# Patient Record
Sex: Male | Born: 1940
Health system: Southern US, Community
[De-identification: ages and names within clinical notes are randomized; demographics above are authoritative.]

## PROBLEM LIST (undated history)

## (undated) DIAGNOSIS — I714 Abdominal aortic aneurysm, without rupture, unspecified: Secondary | ICD-10-CM

## (undated) DIAGNOSIS — R609 Edema, unspecified: Secondary | ICD-10-CM

## (undated) DIAGNOSIS — Z8711 Personal history of peptic ulcer disease: Secondary | ICD-10-CM

## (undated) DIAGNOSIS — E785 Hyperlipidemia, unspecified: Secondary | ICD-10-CM

## (undated) DIAGNOSIS — Z9981 Dependence on supplemental oxygen: Secondary | ICD-10-CM

## (undated) DIAGNOSIS — I509 Heart failure, unspecified: Secondary | ICD-10-CM

## (undated) DIAGNOSIS — I729 Aneurysm of unspecified site: Secondary | ICD-10-CM

## (undated) DIAGNOSIS — F32A Depression, unspecified: Secondary | ICD-10-CM

## (undated) DIAGNOSIS — Z8719 Personal history of other diseases of the digestive system: Secondary | ICD-10-CM

## (undated) DIAGNOSIS — I10 Essential (primary) hypertension: Secondary | ICD-10-CM

## (undated) DIAGNOSIS — I2510 Atherosclerotic heart disease of native coronary artery without angina pectoris: Secondary | ICD-10-CM

## (undated) DIAGNOSIS — J449 Chronic obstructive pulmonary disease, unspecified: Secondary | ICD-10-CM

## (undated) DIAGNOSIS — K56600 Partial intestinal obstruction, unspecified as to cause: Secondary | ICD-10-CM

## (undated) DIAGNOSIS — I219 Acute myocardial infarction, unspecified: Secondary | ICD-10-CM

## (undated) DIAGNOSIS — Z8619 Personal history of other infectious and parasitic diseases: Secondary | ICD-10-CM

## (undated) DIAGNOSIS — F329 Major depressive disorder, single episode, unspecified: Secondary | ICD-10-CM

## (undated) DIAGNOSIS — R600 Localized edema: Secondary | ICD-10-CM

## (undated) DIAGNOSIS — R0602 Shortness of breath: Secondary | ICD-10-CM

## (undated) DIAGNOSIS — F419 Anxiety disorder, unspecified: Secondary | ICD-10-CM

## (undated) HISTORY — DX: Essential (primary) hypertension: I10

## (undated) HISTORY — DX: Shortness of breath: R06.02

## (undated) HISTORY — PX: CARDIAC CATHETERIZATION: SHX172

## (undated) HISTORY — DX: Hyperlipidemia, unspecified: E78.5

## (undated) HISTORY — DX: Atherosclerotic heart disease of native coronary artery without angina pectoris: I25.10

## (undated) HISTORY — PX: HERNIA REPAIR: SHX51

## (undated) HISTORY — PX: CORONARY ARTERY BYPASS GRAFT: SHX141

## (undated) HISTORY — PX: ESOPHAGOGASTRODUODENOSCOPY: SHX1529

## (undated) HISTORY — DX: Abdominal aortic aneurysm, without rupture: I71.4

## (undated) HISTORY — PX: PARTIAL GASTRECTOMY: SHX2172

## (undated) HISTORY — DX: Abdominal aortic aneurysm, without rupture, unspecified: I71.40

## (undated) HISTORY — DX: Aneurysm of unspecified site: I72.9

## (undated) HISTORY — PX: TONSILLECTOMY: SUR1361

## (undated) HISTORY — DX: Chronic obstructive pulmonary disease, unspecified: J44.9

## (undated) HISTORY — DX: Heart failure, unspecified: I50.9

---

## 2003-08-03 ENCOUNTER — Observation Stay (HOSPITAL_COMMUNITY): Admission: RE | Admit: 2003-08-03 | Discharge: 2003-08-04 | Payer: Self-pay | Admitting: General Surgery

## 2009-07-28 ENCOUNTER — Encounter: Payer: Self-pay | Admitting: Family Medicine

## 2009-07-28 ENCOUNTER — Ambulatory Visit: Payer: Self-pay | Admitting: Cardiology

## 2009-07-28 ENCOUNTER — Ambulatory Visit (HOSPITAL_COMMUNITY): Admission: RE | Admit: 2009-07-28 | Discharge: 2009-07-28 | Payer: Self-pay | Admitting: Family Medicine

## 2009-08-06 ENCOUNTER — Encounter (INDEPENDENT_AMBULATORY_CARE_PROVIDER_SITE_OTHER): Payer: Self-pay | Admitting: *Deleted

## 2009-08-06 LAB — CONVERTED CEMR LAB
ALT: 13 units/L
ALT: 13 units/L
Alkaline Phosphatase: 76 units/L
Bilirubin, Direct: 0.1 mg/dL
Bilirubin, Direct: 0.1 mg/dL
CO2: 31 meq/L
CO2: 31 meq/L
Calcium: 9.7 mg/dL
Chloride: 101 meq/L
Cholesterol: 150 mg/dL
Cholesterol: 150 mg/dL
Creatinine, Ser: 0.7 mg/dL
Creatinine, Ser: 0.7 mg/dL
Glucose, Bld: 113 mg/dL
Glucose, Bld: 113 mg/dL
LDL Cholesterol: 96 mg/dL
LDL Cholesterol: 96 mg/dL
Sodium: 140 meq/L
Triglycerides: 67 mg/dL

## 2009-09-19 ENCOUNTER — Encounter (INDEPENDENT_AMBULATORY_CARE_PROVIDER_SITE_OTHER): Payer: Self-pay | Admitting: *Deleted

## 2009-09-19 DIAGNOSIS — I10 Essential (primary) hypertension: Secondary | ICD-10-CM

## 2009-09-19 DIAGNOSIS — J42 Unspecified chronic bronchitis: Secondary | ICD-10-CM

## 2009-09-20 ENCOUNTER — Ambulatory Visit (HOSPITAL_COMMUNITY): Admission: RE | Admit: 2009-09-20 | Discharge: 2009-09-20 | Payer: Self-pay | Admitting: Cardiology

## 2009-09-20 ENCOUNTER — Ambulatory Visit: Payer: Self-pay | Admitting: Cardiology

## 2009-09-20 DIAGNOSIS — K279 Peptic ulcer, site unspecified, unspecified as acute or chronic, without hemorrhage or perforation: Secondary | ICD-10-CM | POA: Insufficient documentation

## 2009-09-20 DIAGNOSIS — F17200 Nicotine dependence, unspecified, uncomplicated: Secondary | ICD-10-CM

## 2009-10-19 ENCOUNTER — Ambulatory Visit: Payer: Self-pay | Admitting: Cardiology

## 2009-10-20 ENCOUNTER — Encounter: Payer: Self-pay | Admitting: Cardiology

## 2010-04-14 ENCOUNTER — Encounter (INDEPENDENT_AMBULATORY_CARE_PROVIDER_SITE_OTHER): Payer: Self-pay | Admitting: *Deleted

## 2010-06-05 ENCOUNTER — Encounter (INDEPENDENT_AMBULATORY_CARE_PROVIDER_SITE_OTHER): Payer: Self-pay | Admitting: *Deleted

## 2010-06-06 ENCOUNTER — Ambulatory Visit
Admission: RE | Admit: 2010-06-06 | Discharge: 2010-06-06 | Payer: Self-pay | Source: Home / Self Care | Attending: Cardiology | Admitting: Cardiology

## 2010-06-06 ENCOUNTER — Encounter (INDEPENDENT_AMBULATORY_CARE_PROVIDER_SITE_OTHER): Payer: Self-pay | Admitting: *Deleted

## 2010-06-06 NOTE — Assessment & Plan Note (Signed)
Summary: 1 mth nurse visit per checkout on 09/20/09/tg  Nurse Visit   Vital Signs:  Patient profile:   70 year old male Height:      69 inches Weight:      181 pounds Pulse rate:   92 / minute BP sitting:   138 / 70  (right arm)  Vitals Entered By: Dreama Saa, CNA (October 19, 2009 4:00 PM)  Current Medications (verified): 1)  Benazepril Hcl 40 Mg Tabs (Benazepril Hcl) .... Take 1/4 Tab Daily 2)  Toprol Xl 50 Mg Xr24h-Tab (Metoprolol Succinate) .... Take 1 Tab Daily 3)  Pravastatin Sodium 20 Mg Tabs (Pravastatin Sodium) .... Take One Tablet By Mouth Daily At Bedtime 4)  Furosemide 20 Mg Tabs (Furosemide) .... Take 2 Tablets By Mouth Daily 5)  Potassium Chloride 20 Meq Pack (Potassium Chloride) .... Take 1 Tab Daily 6)  Proair Hfa 108 (90 Base) Mcg/act Aers (Albuterol Sulfate) .... Use 1 Time Daily 7)  Aspir-Low 81 Mg Tbec (Aspirin) .... Take 1 Tab Daily 8)  Albuterol Sulfate (2.5 Mg/64ml) 0.083% Nebu (Albuterol Sulfate) .... Use As Directed 9)  Nitrolingual 0.4 Mg/spray Soln (Nitroglycerin) .... Use As Needed For Chestpain 10)  Alprazolam 0.25 Mg Tabs (Alprazolam) .... Take 1 Tablet By Mouth Two Times A Day As Needed 11)  Amlodipine Besylate 5 Mg Tabs (Amlodipine Besylate) .... Take One Tablet By Mouth Daily 12)  Nicotine 14 Mg/24hr Pt24 (Nicotine) 13)  Nicotine 21 Mg/24hr Pt24 (Nicotine)  Allergies (verified): No Known Drug Allergies  Visit Type:  1 month nurse visit Primary Provider:  Dr. Lilyan Punt   History of Present Illness: S: 1 month nurse visit B: last seen 09/20/2009, started on amlodipine 5mg  daily     was supposed to begin nicotine patches A: did not start nicotine patches, no c/o at all, returned bp diary      27 readings:      average hr 66,    average sbp  115, average dbp   64 R:  Continue current RX.  [place] Bing, M.D. [facility] with instructions  Teressa Lower RN  October 21, 2009 8:49 AM

## 2010-06-06 NOTE — Letter (Signed)
Summary: Appointment - Reminder 2  [facility]. [address]   [place], [place] [address]   Phone: [phone]  Fax: [phone]     April 14, 2010 MRN: [id number]   [facility] [address] [place], [place]  [address]   Dear Tommy Turner,  Our records indicate that it is time to schedule a follow-up appointment.  Dr. Dietrich Pates         recommended that you follow up with [place] in   11.2011 PAST DUE         . It is very important that we reach you to schedule this appointment. We look forward to participating in your health care needs. Please contact us at the number listed above at your earliest convenience to schedule your appointment.  If you are unable to make an appointment at this time, give [place] a call so we can update our records.     Sincerely,   Glass blower/designer

## 2010-06-06 NOTE — Letter (Signed)
Summary: bp readings  bp readings   Imported By: Faythe Ghee 10/20/2009 10:32:37  _____________________________________________________________________  External Attachment:    Type:   Image     Comment:   External Document

## 2010-06-06 NOTE — Assessment & Plan Note (Signed)
Summary: NP3 ABNORMAL EKG, DECREASE EJECTION FRACTION ON ECHO   Visit Type:  Initial Consult Primary Provider:  Dr. Lilyan Punt   History of Present Illness: [name] is seen in the office for an initial visit at the kind request of Dr. Lilyan Punt for evaluation and treatment of coronary artery disease.  This nice gentleman underwent CABG surgery at [facility] approximately 10 years ago in an uncomplicated procedure.  He has had no apparent manifestations of ischemia since, but has not seen a cardiologist nor undergone any significant cardiac testing until an echocardiogram performed recently showed moderate to severe left ventricular dysfunction.  His EKG shows evidence for multiple previous infarctions.  Records of treatment [facility] have been requested, but not yet obtained.  Medical records from Dr. Fletcher Anon office were reviewed.  Current Medications (verified): 1)  Benazepril Hcl 40 Mg Tabs (Benazepril Hcl) .... Take 1/4 Tab Daily 2)  Toprol Xl 50 Mg Xr24h-Tab (Metoprolol Succinate) .... Take 1 Tab Daily 3)  Pravastatin Sodium 20 Mg Tabs (Pravastatin Sodium) .... Take One Tablet By Mouth Daily At Bedtime 4)  Furosemide 20 Mg Tabs (Furosemide) .... Take 2 Tablets By Mouth Daily 5)  Potassium Chloride 20 Meq Pack (Potassium Chloride) .... Take 1 Tab Daily 6)  Proair Hfa 108 (90 Base) Mcg/act Aers (Albuterol Sulfate) .... Use 1 Time Daily 7)  Aspir-Low 81 Mg Tbec (Aspirin) .... Take 1 Tab Daily 8)  Albuterol Sulfate (2.5 Mg/32ml) 0.083% Nebu (Albuterol Sulfate) .... Use As Directed 9)  Nitrolingual 0.4 Mg/spray Soln (Nitroglycerin) .... Use As Needed For Chestpain 10)  Alprazolam 0.25 Mg Tabs (Alprazolam) .... Take 1 Tablet By Mouth Two Times A Day As Needed 11)  Amlodipine Besylate 5 Mg Tabs (Amlodipine Besylate) .... Take One Tablet By Mouth Daily 12)  Nicotine 14 Mg/24hr Pt24 (Nicotine) 13)  Nicotine 21 Mg/24hr Pt24 (Nicotine)  Allergies (verified): No Known  Drug Allergies  Past History:  Family History: Last updated: 2009-10-09 Father died in his 58s secondary to neoplastic disease Mother died in her 60s secondary to neoplastic disease 12 siblings, 2 brothers deceased due to neoplastic disease; one sister deceased due to emphysema  Social History: Last updated: October 09, 2009 Full Time Married with 3 children Tobacco Use - Yes.  Alcohol Use - no Regular Exercise - no Drug Use - no  Past Medical History: ASCVD: CABG surgery in 2000 HYPERTENSION (ICD-401.9) BRONCHITIS, CHRONIC (ICD-491.9) Tobacco abuse-50 pack years; one pack per day Peptic ulcer disease-status post surgical intervention Pedal edema  Past Surgical History: CABG surgery-2000 Partial gastrectomy for peptic ulcer disease in 1990 Left inguinal hernia repair-2005  Family History: Father died in his 78s secondary to neoplastic disease Mother died in her 84s secondary to neoplastic disease 12 siblings, 2 brothers deceased due to neoplastic disease; one sister deceased due to emphysema  Social History: Full Time Married with 3 children Tobacco Use - Yes.  Alcohol Use - no Regular Exercise - no Drug Use - no  EKG  Procedure date:  10-09-09  Findings:      Normall sinus rhythm First degree AV block Left atrial abnormality IVCD Low-voltage Prior inferior myocardial infarction Prior anterolateral myocardial infarction No previous tracing for comparison.   Review of Systems       Patient reports urinary frequency; mild edema of the feet and ankles.  All the systems reviewed and are negative.  Vital Signs:  Patient profile:   70 year old male Height:      69 inches  Weight:      184 pounds BMI:     27.27 Pulse rate:   70 / minute BP sitting:   165 / 81  (right arm)  Vitals Entered By: Dreama Saa, CNA (Sep 20, 2009 1:18 PM)  Physical Exam  General:    Proportionate weight and height; well-developed; no acute distress: HEENT-Gem/AT; PERRL; EOM  intact; conjunctiva and lids nl:  Neck-No JVD; no carotid bruits: Endocrine-No thyromegaly: Lungs-No tachypnea, few bibasilar rales, decreased breath sounds at the base; expiratory wheeze with prolonged expiratory phase CV-normal PMI; normal S1 and S2; S4 present Abdomen-BS normal; soft and non-tender without masses or organomegaly: MS-No deformities, cyanosis or clubbing: Neurologic-Nl cranial nerves; symmetric strength and tone: Skin- Warm, no sig. lesions: Extremities-Nl distal pulses; 1+ ankle edema    Impression & Recommendations:  Problem # 1:  ATHEROSCLEROTIC CARDIOVASCULAR DISEASE-CABG (ICD-429.2) No symptoms at present to indicate the presence of ischemia.  Patient has electrocardiographic and echocardiographic evidence for previous myocardial infarction.  Based upon the available records, it cannot be determined whether this occurred prior to CABG or subsequently.  We are seeking appropriate records to determine this.  In the interim, we will attempt to control risk factors optimally.  Problem # 2:  TOBACCO ABUSE (ICD-305.1) Importance of discontinuing tobacco use was discussed.  Patient did so for approximately one month after his surgery, but resumed cigarette smoking at his prior level.  He develops tremor and irritability when he doesn't smoke.  We will attempt to combat this with nicotine replacement therapy.  A chest x-ray will be obtained in view of his history and physical findings.  Problem # 3:  HYPERTENSION (ICD-401.9) Blood pressure control is suboptimal.  Amlodipine 5 mg q.d. will be added to his medical regime.  Hopefully, this will not exacerbate peripheral edema. [name] will return in one month for assessment by the cardiology nurses and in 6 months for reassessment by me.  Other Orders: T-Chest x-ray, 2 views ([address])  Patient Instructions: 1)  Your physician recommends that you schedule a follow-up appointment in: 6 months 2)  You have been referred to nurse  visit in 1 month, please brng medications and bp diary to nurse visit 3)  A chest x-ray takes a picture of the organs and structures inside the chest, including the heart, lungs, and blood vessels. This test can show several things, including, whether the heart is enlarged; whether fluid is building up in the lungs; and whether pacemaker / defibrillator leads are still in place. 4)  Your physician discussed the hazards of tobacco use.  Tobacco use cessation is recommended and techniques and options to help you quit were discussed. 5)  Your physician has requested that you regularly monitor and record your blood pressure readings at home.  Please use the same machine at the same time of day to check your readings and record them to bring to your follow-up visit. Prescriptions: NICOTINE 21 MG/24HR PT24 (NICOTINE)   #30 x 1   Entered by:   Teressa Lower RN   Authorized by:   Kathlen Brunswick, MD, [facility]   Signed by:   Teressa Lower RN on 09/20/2009   Method used:   Print then Give to Patient   RxID:   [id number] NICOTINE 14 MG/24HR PT24 (NICOTINE)   #30 x 0   Entered by:   Teressa Lower RN   Authorized by:   Kathlen Brunswick, MD, [facility]   Signed by:   Teressa Lower RN on 09/20/2009  Method used:   Print then Give to Patient   RxID:   [id number] AMLODIPINE BESYLATE 5 MG TABS (AMLODIPINE BESYLATE) Take one tablet by mouth daily  #30 x 6   Entered by:   Teressa Lower RN   Authorized by:   Kathlen Brunswick, MD, [facility]   Signed by:   Teressa Lower RN on 09/20/2009   Method used:   Print then Give to Patient   RxID:   [id number]

## 2010-06-14 NOTE — Miscellaneous (Signed)
Summary: LABS BMP,LIPIDS,LIVER,08/06/2009  Clinical Lists Changes  Observations: Added new observation of CALCIUM: 9.7 mg/dL (16/02/9603 54:09) Added new observation of ALBUMIN: 4.6 g/dL (81/19/1478 29:56) Added new observation of PROTEIN, TOT: 7.0 g/dL (21/30/8657 84:69) Added new observation of SGPT (ALT): 13 units/L (08/06/2009 16:24) Added new observation of SGOT (AST): 15 units/L (08/06/2009 16:24) Added new observation of ALK PHOS: 76 units/L (08/06/2009 16:24) Added new observation of BILI DIRECT: 0.1 mg/dL (62/95/2841 32:44) Added new observation of CREATININE: 0.70 mg/dL (05/09/7251 66:44) Added new observation of BUN: 14 mg/dL (03/47/4259 56:38) Added new observation of BG RANDOM: 113 mg/dL (75/64/3329 51:88) Added new observation of CO2 PLSM/SER: 31 meq/L (08/06/2009 16:24) Added new observation of CL SERUM: 101 meq/L (08/06/2009 16:24) Added new observation of K SERUM: 5.0 meq/L (08/06/2009 16:24) Added new observation of NA: 140 meq/L (08/06/2009 16:24) Added new observation of LDL: 96 mg/dL (41/66/0630 16:01) Added new observation of HDL: 41 mg/dL (09/32/3557 32:20) Added new observation of TRIGLYC TOT: 67 mg/dL (25/42/7062 37:62) Added new observation of CHOLESTEROL: 150 mg/dL (83/15/1761 60:73)

## 2010-06-14 NOTE — Assessment & Plan Note (Signed)
Summary: Z6X   Visit Type:  Follow-up Primary Provider:  Dr. Lilyan Punt   History of Present Illness: [name] returns to the office for continued assessment and treatment of coronary disease.  Since CABG surgery more than a decade ago, he has done quite well.  He maintains a good level of activity without any chest discomfort, but does intermittently experience dyspnea, likely related to chronic obstructive pulmonary disease.  He uses albuterol once or twice a day with benefit in addition to Pro-air daily.  He has had no orthopnea, PND or pedal edema.   Current Medications (verified): 1)  Benazepril Hcl 40 Mg Tabs (Benazepril Hcl) .... Take 1/2 Tablet By Mouth Daily 2)  Lopressor 50 Mg Tabs (Metoprolol Tartrate) .... Take 1 Tab Two Times A Day 3)  Pravastatin Sodium 40 Mg Tabs (Pravastatin Sodium) .... Take One Tablet By Mouth Daily At Bedtime 4)  Furosemide 20 Mg Tabs (Furosemide) .... Take 2 Tablets By Mouth Daily 5)  Potassium Chloride 20 Meq Pack (Potassium Chloride) .... Take 1 Tab Daily 6)  Proair Hfa 108 (90 Base) Mcg/act Aers (Albuterol Sulfate) .... Use 1 Time Daily 7)  Aspir-Low 81 Mg Tbec (Aspirin) .... Take 1 Tab Daily 8)  Albuterol Sulfate (2.5 Mg/25ml) 0.083% Nebu (Albuterol Sulfate) .... Use As Directed 9)  Nitrolingual 0.4 Mg/spray Soln (Nitroglycerin) .... Use As Needed For Chestpain 10)  Alprazolam 0.25 Mg Tabs (Alprazolam) .... Take 1 Tablet By Mouth Two Times A Day As Needed 11)  Amlodipine Besylate 5 Mg Tabs (Amlodipine Besylate) .... Take One Tablet By Mouth Daily 12)  Wellbutrin Sr 100 Mg Xr12h-Tab (Bupropion Hcl) .... Take 1 Tablet By Mouth Once A Day Then Increase To 150mg  Bid 13)  Wellbutrin Sr 150 Mg Xr12h-Tab (Bupropion Hcl) .... Take 1 Tablet By Mouth Two Times A Day  Allergies (verified): No Known Drug Allergies  Comments:  Nurse/Medical Assistant: patient brought med list reviewed with patient patient uses [facility] [place]  Past  History:  Past Medical History: Last updated: 21-Sep-2009 ASCVD: CABG surgery in 2000 HYPERTENSION (ICD-401.9) BRONCHITIS, CHRONIC (ICD-491.9) Tobacco abuse-50 pack years; one pack per day Peptic ulcer disease-status post surgical intervention Pedal edema  Past Surgical History: Last updated: 21-Sep-2009 CABG surgery-2000 Partial gastrectomy for peptic ulcer disease in 1990 Left inguinal hernia repair-2005  Family History: Last updated: 09/21/09 Father died in his 74s secondary to neoplastic disease Mother died in her 39s secondary to neoplastic disease 12 siblings, 2 brothers deceased due to neoplastic disease; one sister deceased due to emphysema  Social History: Last updated: 09-21-2009 Full Time Married with 3 children Tobacco Use - Yes.  Alcohol Use - no Regular Exercise - no Drug Use - no  PMH, FH, and Social History reviewed and updated.  Review of Systems       See history of present illness.  Vital Signs:  Patient profile:   70 year old male Weight:      175 pounds BMI:     25.94 O2 Sat:      92 % on Room air Pulse rate:   85 / minute BP sitting:   146 / 77  (left arm)  Vitals Entered By: Dreama Saa, CNA (June 06, 2010 1:19 PM)  O2 Flow:  Room air  Physical Exam  General:  Proportionate weight and height; well developed; no acute distress:   Weight-175, 6 pounds less then in 10/2009 Neck-No JVD; no carotid bruits: Lungs-expiratory wheezes and rhonchi; prolonged expiratory phase; no  rales. Cardiovascular-normal PMI; normal S1 and S2: Abdomen-BS normal; soft and non-tender without masses or organomegaly:  Musculoskeletal-No deformities, cyanosis or clubbing: Neurologic-Normal cranial nerves; symmetric strength and tone:  Skin-Warm, no significant lesions: Extremities-Nl distal pulses;  trace edema:     Impression & Recommendations:  Problem # 1:  ATHEROSCLEROTIC CARDIOVASCULAR DISEASE-CABG (ICD-429.2) Patient has no symptoms to suggest  recurrent myocardial ischemia.  Our efforts will focus on optimal control of cardiovascular risk factor.  Results of the lipid profile obtained 8 months ago were superb.  Current therapy will be continued.     Problem # 2:  TOBACCO ABUSE (ICD-305.1) Mr. Stailey has attempted to discontinue cigarette smoking without success.  When he utilized nicotine patches, he experienced tremor within a few hours of his last cigarette.  He is reluctant to try Chantix due to the adverse effects that have been reported.  I've given him Wellbutrin with appropriate instructions for use and asked him to think about joining a group or seeking assistance from a therapist.  Problem # 3:  HYPERTENSION (ICD-401.9) Blood pressure control has generally been good, but systolics are mildly elevated.  He will increase benazepril to 20 mg q.d. and continue to monitor blood pressure.  Other Orders: Future Orders: T-Lipid Profile ([id number]) ... 05/07/2011 T-Comprehensive Metabolic Panel [id number]) ... 05/07/2011  Patient Instructions: 1)  Your physician recommends that you schedule a follow-up appointment in: 1 year 2)  Your physician recommends that you return for lab work in:11 months 3)  Your physician has recommended you make the following change in your medication: increase benazepril to 1/2 tablet daily, increase pravastatin to 40mg  dialy, wellbutrin 100mg  daily x 7 days then increase to wellbutrin 150mg  two times a day 4)   for 2 months   STOP SMOKING ON DAY 10 Prescriptions: WELLBUTRIN SR 150 MG XR12H-TAB (BUPROPION HCL) Take 1 tablet by mouth two times a day  #60 x 2   Entered by:   Teressa Lower RN   Authorized by:   Kathlen Brunswick, MD, [facility]   Signed by:   Teressa Lower RN on 06/06/2010   Method used:   Electronically to        [facility]  [address] (retail)       [address]       [place], [place]  [address]       Ph: [phone]       Fax: [phone]   RxID:    [id number] WELLBUTRIN SR 100 MG XR12H-TAB (BUPROPION HCL) Take 1 tablet by mouth once a day then increase to 150mg  bid  #7 x 0   Entered by:   Teressa Lower RN   Authorized by:   Kathlen Brunswick, MD, [facility]   Signed by:   Teressa Lower RN on 06/06/2010   Method used:   Electronically to        [facility]  [address] (retail)       [address]       [place], [place]  [address]       Ph: [phone]       Fax: [phone]   RxID:   [id number] PRAVASTATIN SODIUM 40 MG TABS (PRAVASTATIN SODIUM) Take one tablet by mouth daily at bedtime  #30 x 3   Entered by:   Teressa Lower RN   Authorized by:   Kathlen Brunswick, MD, [facility]   Signed  by:   Teressa Lower RN on 06/06/2010   Method used:   Electronically to        [facility]  [address] (retail)       [address]       [place], [place]  [address]       Ph: [phone]       Fax: [phone]   RxID:   [id number] BENAZEPRIL HCL 40 MG TABS (BENAZEPRIL HCL) take 1/2 tablet by mouth daily  #15 x 30   Entered by:   Teressa Lower RN   Authorized by:   Kathlen Brunswick, MD, [facility]   Signed by:   Teressa Lower RN on 06/06/2010   Method used:   Electronically to        [facility]  [address] (retail)       [address]       [place], [place]  [address]       Ph: [phone]       Fax: [phone]   RxID:   [id number]

## 2010-06-14 NOTE — Letter (Signed)
Summary: [facility] at [employer]  [address], [place] [address]   Phone: [phone]  Fax: [phone]     June 06, 2010 MRN: [id number]   [facility] [address] [place], [place]  [address]      YOUR LAB WORK IS DUE   May 07, 2011  Please go to [facility], located across the street from [facility] on the second floor.  Hours are Monday - Friday 7am until 7:30pm         Saturday 8am until 12noon    _X_  DO NOT EAT OR DRINK AFTER MIDNIGHT EVENING PRIOR TO LABWORK

## 2010-08-23 ENCOUNTER — Telehealth: Payer: Self-pay | Admitting: Cardiology

## 2010-08-23 NOTE — Telephone Encounter (Signed)
Tommy Turner took call while we were in meeting / only information was that pt's wife would like to speak with nurse regarding prescriptions / tg

## 2010-08-24 NOTE — Telephone Encounter (Signed)
Pt is to take 1/2 tablet of benazepril daily

## 2010-09-22 NOTE — Op Note (Signed)
Tommy Turner, Tommy Turner                         ACCOUNT NO.:  [id number]   MEDICAL RECORD NO.:  [id number]                   PATIENT TYPE:  AMB   LOCATION:  DAY                                  FACILITY:  [facility]   PHYSICIAN:  Jerolyn Shin C. Katrinka Blazing, M.D.                DATE OF BIRTH:  1940/06/30   DATE OF PROCEDURE:  DATE OF DISCHARGE:                                 OPERATIVE REPORT   PREOPERATIVE DIAGNOSES:  Left inguinal hernia.   POSTOPERATIVE DIAGNOSES:  Left indirect inguinal hernia.   PROCEDURE:  Left inguinal hernia repair.   SURGEON:  Dirk Dress. Katrinka Blazing, M.D.   DESCRIPTION OF PROCEDURE:  Under spinal anesthesia, the left inguinal area  was prepped and draped in a sterile field.  Upon starting the procedure, the  patient felt significant pain so he was switched to a general LMA.  A  curvilinear left lower quadrant incision was made. It was extended down to  the aponeurosis.  The superficial vessels were doubly clamped, divided and  tied with 3-0 Vicryl.  The aponeurosis was opened. There was a very hard  indurated mass of the cord.  This mass was dissected back to the internal  ring.  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 The appendiceal epiploica was clamped at the base,  divided and controlled with ligature of 3-0 Vicryl.  The sac was then  clamped and suture ligated with 3-0 Monocryl.  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  The lipoma of the cord was  dissected back to the internal ring, clamped, excised and controlled with  ligatures of 3-0 Vicryl.  A medium plug was placed and was sutured in place  with interrupted #0 Prolene. Only a patch was placed and was sutured in  place with #0 Prolene.  The cord was placed in anatomic position. The  aponeurosis was closed with 3-0 Vicryl.  The subcutaneous tissue was closed  with 3-0 Biosyn.  The skin was closed with staples. Local infiltrating with  20 mL of  0.5% Marcaine with epinephrine was carried out.  The incision was  closed with staples.  A dressing was placed. The patient was awakened from  anesthesia, transferred to a bed and taken to the post anesthesia care unit  for monitoring.      ___________________________________________                                            Dirk Dress. Katrinka Blazing, M.D.   LCS/MEDQ  D:  08/03/2003  T:  08/03/2003  Job:  [id number]

## 2010-09-22 NOTE — H&P (Signed)
Tommy Turner, Tommy Turner                         ACCOUNT NO.:  [id number]   MEDICAL RECORD NO.:  [id number]                   PATIENT TYPE:  AMB   LOCATION:  DAY                                  FACILITY:  [facility]   PHYSICIAN:  Jerolyn Shin C. Katrinka Blazing, M.D.                DATE OF BIRTH:  June 25, 1940   DATE OF ADMISSION:  DATE OF DISCHARGE:                                HISTORY & PHYSICAL   HISTORY OF PRESENT ILLNESS:  [age]-year-old male with a history of left  groin hernia.  Hernia started at work on 16 July 2003.  He was diagnosed on  19 July 2003.  He was placed on bedrest and his pain has improved.  He is  now scheduled for a left inguinal hernia repair.   PAST HISTORY:  1. He has atherosclerotic heart disease, status post multivessel coronary     artery bypass graft.  2. He has chronic bronchitis.  3. Hypertension.  4. Chronic peripheral edema.   SURGERY:  1. Gastric surgery.  2. Coronary artery bypass graft.   MEDICATIONS:  1. Benazepril 40 mg daily.  2. Metoprolol 50 mg daily.  3. Lipitor 10 mg daily.  4. Furosemide 20 mg b.i.d.  5. Potassium 20 mEq daily.  6. Albuterol inhaler 4 times daily.  7. Advair inhaler 1 inhalation daily.  8. Aspirin 81 mg daily.   SOCIAL HISTORY:  He is married, employed, smokes 1 pack of cigarettes a day,  does not drink or use drugs.   FAMILY HISTORY:  Family history is positive for diabetes mellitus and GI  cancer.   PHYSICAL EXAMINATION:  VITAL SIGNS:  On exam, blood pressure 130/78, pulse  80, respirations 20, weight 190 pounds.  HEENT:  Unremarkable.  NECK:  Neck is supple.  CHEST:  Chest is clear.  He has a healed median sternotomy scar.  HEART:  Heart is regular rate and rhythm without murmur, gallop or rub.  ABDOMEN:  Abdomen is soft, nontender and no masses.  Large symptomatic left  inguinal hernia.  SKIN:  Skin reveals diffuse cutaneous lipomatosis of entire body, upper  extremities, lower extremities and trunk.  EXTREMITIES:   No cyanosis, clubbing or edema.  NEUROLOGIC:  No focal motor, sensory or cerebellar deficit.   IMPRESSION:  1. Left inguinal hernia.  2. Chronic bronchitis.  3. Atherosclerotic heart disease.  4. Hypertension.  5. Chronic peripheral edema.   PLAN:  Scheduled for left inguinal hernia repair.     ___________________________________________                                         Tommy Turner. Katrinka Blazing, M.D.   LCS/MEDQ  D:  08/03/2003  T:  08/03/2003  Job:  [id number]

## 2010-10-12 ENCOUNTER — Other Ambulatory Visit: Payer: Self-pay | Admitting: Cardiology

## 2010-10-16 NOTE — Telephone Encounter (Signed)
[place] patient.   

## 2010-10-16 NOTE — Telephone Encounter (Signed)
**Note De-identified Dago Jungwirth Obfuscation** [place] pt. 

## 2010-10-17 ENCOUNTER — Other Ambulatory Visit: Payer: Self-pay | Admitting: Family Medicine

## 2010-10-17 DIAGNOSIS — Z139 Encounter for screening, unspecified: Secondary | ICD-10-CM

## 2010-10-26 ENCOUNTER — Other Ambulatory Visit (HOSPITAL_COMMUNITY): Payer: Self-pay

## 2010-11-23 ENCOUNTER — Ambulatory Visit (HOSPITAL_COMMUNITY)
Admission: RE | Admit: 2010-11-23 | Discharge: 2010-11-23 | Disposition: A | Discharge status: Home / Self Care | Payer: 59 | Source: Ambulatory Visit | Attending: Family Medicine | Admitting: Family Medicine

## 2010-11-23 ENCOUNTER — Other Ambulatory Visit: Payer: Self-pay | Admitting: Family Medicine

## 2010-11-23 DIAGNOSIS — I714 Abdominal aortic aneurysm, without rupture, unspecified: Secondary | ICD-10-CM | POA: Insufficient documentation

## 2010-11-23 DIAGNOSIS — Z139 Encounter for screening, unspecified: Secondary | ICD-10-CM

## 2010-12-27 ENCOUNTER — Encounter: Payer: Self-pay | Admitting: Vascular Surgery

## 2010-12-27 NOTE — Progress Notes (Signed)
[facility] [place]  Referred by: Dr. Georgie Chard  Reason for referral: AAA  History of Present Illness  The patient is a 70 y.o. male who presents with chief complaint: AAA.  Previous studies demonstrate an AAA, measuring 4.5 cm.  The patient does not have back or abdominal pain.  The patient does not have a history of embolic episodes from the AAA.  The patient's risk factors for AAA included: male sex, age, and smoking history.  The patient continues to smoke cigarettes.  Past Medical History  Diagnosis Date  . AAA (abdominal aortic aneurysm)   . Cough   . Shortness of breath   . COPD (chronic obstructive pulmonary disease)   . Hyperlipidemia   . CAD (coronary artery disease)   . Hypertension   . Ulcer     gastric    Past Surgical History  Procedure Date  . Lipoma excision     right lower back  . Partial gastrectomy     approx 1990    History   Social History  . Marital Status: Married    Spouse Name: N/A    Number of Children: N/A  . Years of Education: N/A   Occupational History  . Not on file.   Social History Main Topics  . Smoking status: Current Everyday Smoker -- 0.5 packs/day for 55 years    Types: Cigarettes  . Smokeless tobacco: Not on file  . Alcohol Use: No  . Drug Use: No  . Sexually Active: Not on file   Other Topics Concern  . Not on file   Social History Narrative  . No narrative on file    Family History  Problem Relation Age of Onset  . Hypertension Brother   . Heart disease Brother   . Cancer Mother   . Cancer Father     prostate  . COPD Sister     Current outpatient prescriptions:albuterol (PROVENTIL HFA;VENTOLIN HFA) 108 (90 BASE) MCG/ACT inhaler, Inhale 2 puffs into the lungs as needed.  , Disp: , Rfl: ;  albuterol (PROVENTIL) (2.5 MG/3ML) 0.083% nebulizer solution, Take 2.5 mg by nebulization every 4 (four) hours as needed.  , Disp: , Rfl: ;  aspirin 81 MG tablet, Take 81 mg by mouth daily.  , Disp:  , Rfl:  benazepril (LOTENSIN) 40 MG tablet, Take 40 mg by mouth. Take 1/2 tab daily, Disp: , Rfl: ;  furosemide (LASIX) 20 MG tablet, Take 20 mg by mouth. Take 2 tabs in am daily, Disp: , Rfl: ;  metoprolol (TOPROL-XL) 50 MG 24 hr tablet, Take 50 mg by mouth daily.  , Disp: , Rfl: ;  nitroGLYCERIN (NITROLINGUAL) 0.4 MG/SPRAY spray, Place 1 spray under the tongue every 5 (five) minutes as needed.  , Disp: , Rfl:  potassium chloride SA (K-DUR,KLOR-CON) 20 MEQ tablet, Take 20 mEq by mouth daily.  , Disp: , Rfl: ;  pravastatin (PRAVACHOL) 40 MG tablet, TAKE ONE TABLET BY MOUTH EVERY DAY AT BEDTIME **STOP  TAKING  PRAVASTATIN  20MG **, Disp: 30 tablet, Rfl: 6;  tiotropium (SPIRIVA) 18 MCG inhalation capsule, Place 18 mcg into inhaler and inhale daily.  , Disp: , Rfl:   Allergies as of 12/29/2010  . (No Known Allergies)    Review of Systems (Positive items in bold and italic, otherwise negative)  General: Weight loss, Weight gain, Loss of appetite, Fever  Neurologic: Dizziness, Blackouts, Headaches, Seizure  Ear/Nose/Throat: Change in eyesight, Change in hearing, Nose bleeds, Sore throat  Vascular: Pain in legs with walking, Pain in feet while lying flat, Non-healing ulcer, Stroke, "Mini stroke", Slurred speech, Temporary blindness, Blood clot in vein, Phlebitis  Pulmonary: Home oxygen, Productive cough, Bronchitis, Coughing up blood, Asthma, Wheezing  Musculoskeletal: Arthritis, Joint pain, Muscle pain  Cardiac: Chest pain, Chest tightness/pressure, Shortness of breath when lying flat, Shortness of breath with exertion, Palpitations, Heart murmur, Arrythmia, Atrial fibrillation  Hematologic: Bleeding problems, Clotting disorder, Anemia  Psychiatric:  Depression, Anxiety, Attention deficit disorder  Gastrointestinal:  Black stool, Blood in stool, Peptic ulcer disease, Reflux, Hiatal hernia, Trouble swallowing, Diarrhea, Constipation  Urinary:  Kidney disease, Burning with urination, Frequent  urination, Difficulty urinating  Skin: Ulcers, Rashes  Physical Examination  Filed Vitals:   12/29/10 0916  BP: 146/73  Pulse: 64  Resp: 20    General: A&O x 3, WDWN, appears stated age  Head: [place]/AT, mild Temporalis wasting   Ear/Nose/Throat: Hearing grossly intact, nares w/o erythema or drainage, oropharynx w/o Erythema/Exudate  Eyes: PERRLA, EOMI  Neck: Supple, no nuchal rigidity, no palpable LAD  Pulmonary: Sym exp, good air movt, CTAB, no rales, rhonchi, & wheezing  Cardiac: RRR, Nl S1, S2, no Murmurs, rubs or gallops  Vascular: Vessel Right Left  Radial Palpable Palpable  [place] Palpable Palpable  Brachial Palpable Palpable  Carotid Palpable, with bruit Palpable, without bruit  Aorta Non-palpable N/A  Femoral Palpable Palpable  Popliteal Prominently palpable Palpable  PT Palpable Palpable  DP Palpable Palpable   Gastrointestinal: soft, NTND, -G/R, - HSM, - masses, - CVAT B, no palpable pulsatile midline mass  Musculoskeletal: M/S 5/5 throughout , Extremities without ischemic changes   Neurologic: CN 2-12 intact , Pain and light touch intact in extremities , Motor exam as listed above  Psychiatric: Judgment intact, Mood & affect appropriate for pt's clinical situation  Dermatologic: See M/S exam for extremity exam, no rashes otherwise noted, extensive actinic keratoses on arms and face  Lymph : No Cervical, Axillary, or Inguinal lymphadenopathy   Non-Invasive Vascular Imaging   B Carotid duplex and B popliteal duplex ordered  Outside Studies/Documentation 11 pages of outside documents were reviewed including: history and ultrasound report.  Medical Decision Making  The patient is a 70 y.o. male who presents with: 4.5 AAA, R carotid bruit, and prominent B popliteal pulses.   Based on this patient's exam and diagnostic studies, he needs CTA Chest, Abd, pelvis to eval his anatomy and r/o TAA  He also needs a screening B carotid duplex to r/o carotid  stenosis and B popliteal duplex to r/o popliteal aneurysms.  The threshold for repair is AAA size > 5.5 cm, growth > 1 cm/yr, and symptomatic status.  The patient will follow up in 2-4 weeks with the above studies.    I emphasized the importance of maximal medical management including strict control of blood pressure, blood glucose, and lipid levels, obtaining regular exercise, and cessation of smoking.    Thank you for allowing [place] to participate in this patient's care.  Leonides Sake, MD [facility] [place] Office: [phone] Pager: [phone]

## 2010-12-28 ENCOUNTER — Encounter: Payer: Self-pay | Admitting: Vascular Surgery

## 2010-12-29 ENCOUNTER — Ambulatory Visit (INDEPENDENT_AMBULATORY_CARE_PROVIDER_SITE_OTHER): Payer: 59 | Admitting: Vascular Surgery

## 2010-12-29 ENCOUNTER — Encounter: Payer: Self-pay | Admitting: Vascular Surgery

## 2010-12-29 VITALS — BP 146/73 | HR 64 | Resp 20 | Ht 69.0 in | Wt 170.5 lb

## 2010-12-29 DIAGNOSIS — I714 Abdominal aortic aneurysm, without rupture: Secondary | ICD-10-CM

## 2011-02-01 ENCOUNTER — Encounter: Payer: Self-pay | Admitting: Vascular Surgery

## 2011-02-02 ENCOUNTER — Other Ambulatory Visit (INDEPENDENT_AMBULATORY_CARE_PROVIDER_SITE_OTHER): Payer: 59 | Admitting: *Deleted

## 2011-02-02 ENCOUNTER — Ambulatory Visit
Admission: RE | Admit: 2011-02-02 | Discharge: 2011-02-02 | Disposition: A | Discharge status: Home / Self Care | Payer: 59 | Source: Ambulatory Visit | Attending: Vascular Surgery | Admitting: Vascular Surgery

## 2011-02-02 ENCOUNTER — Ambulatory Visit (INDEPENDENT_AMBULATORY_CARE_PROVIDER_SITE_OTHER): Payer: 59 | Admitting: Vascular Surgery

## 2011-02-02 ENCOUNTER — Ambulatory Visit (INDEPENDENT_AMBULATORY_CARE_PROVIDER_SITE_OTHER): Payer: 59 | Admitting: *Deleted

## 2011-02-02 ENCOUNTER — Encounter: Payer: Self-pay | Admitting: Vascular Surgery

## 2011-02-02 VITALS — BP 102/65 | HR 80 | Resp 14 | Ht 68.0 in | Wt 186.0 lb

## 2011-02-02 DIAGNOSIS — R0989 Other specified symptoms and signs involving the circulatory and respiratory systems: Secondary | ICD-10-CM

## 2011-02-02 DIAGNOSIS — I714 Abdominal aortic aneurysm, without rupture, unspecified: Secondary | ICD-10-CM | POA: Insufficient documentation

## 2011-02-02 DIAGNOSIS — I700 Atherosclerosis of aorta: Secondary | ICD-10-CM

## 2011-02-02 MED ORDER — IOHEXOL 350 MG/ML SOLN: 100.0000 mL | Freq: Once | INTRAVENOUS | Status: AC | PRN

## 2011-02-02 NOTE — Procedures (Unsigned)
LOWER EXTREMITY ARTERIAL DUPLEX  INDICATION:  Prominent popliteal pulses.  History of AAA.  HISTORY: Diabetes:  No. Cardiac:  Yes. Hypertension:  Yes. Smoking:  Yes. Previous Surgery:  No.  SINGLE LEVEL ARTERIAL EXAM                         RIGHT                LEFT Brachial: Anterior tibial: Posterior tibial: Peroneal: Ankle/Brachial Index:  LOWER EXTREMITY ARTERIAL DUPLEX EXAM  DUPLEX: 1. Normal right popliteal artery with triphasic waveforms. 2. Slight ectasia of the left popliteal artery with triphasic     waveforms.  Mild intramural thrombus is observed. 3. See attached diagram for diameter measurements.  IMPRESSION: 1. No evidence of popliteal aneurysm, although left popliteal artery     is slightly ectatic. 2. Normal arterial waveforms in the bilateral popliteal arteries.   ___________________________________________ Fransisco Hertz, MD  LT/MEDQ  D:  02/02/2011  T:  02/02/2011  Job:  [id number]

## 2011-02-02 NOTE — Progress Notes (Signed)
[facility] [place]  Established Abdominal Aortic Aneurysm  History of Present Illness  The patient is a 70 y.o. male who presents with chief complaint: follow up for CTA chest/abd and vascular lab studies.  The patient does not have back or abdominal pain.  He does not have intermittent claudication or rest pain.  The patient's PMH, PSH, SH, FamHx, Med, Allergies and ROS are unchanged from 12/29/10.  Physical Examination  Filed Vitals:   02/02/11 1530  BP: 102/65  Pulse: 80  Resp: 14  Height: 5\' 8"  (1.727 m)  Weight: 186 lb (84.369 kg)    General: A&O x 3, WDWN  Gastrointestinal: soft, NTND, -G/R, - HSM, - masses, - CVAT B, no midline aortic mass  Musculoskeletal: Gait without ataxia, no obvious ischemia in any extremities  Neurologic: Pain and light touch intact in extremities , Motor exam as listed above  Radiology  CTA Chest/Abd/Pelvis (Date: 02/02/11) 1. Infrarenal abdominal aortic aneurysm with pre stent  measurements as above. Approximately 50% narrowing of the origin  of the right common iliac artery.  2. No thoracic aortic aneurysm.  3. Postsurgical change of the intestines with perianastomotic bowel dilatation but no evidence of enteric obstruction.  4. Moderate to severe emphysematous change of the lungs.  5. Cardiomegaly with mild dilatation of the left ventricular cavity. Further evaluation may be obtained with a cardiac echo as clinically indicated.  Based on my interpretation of the CT, patient remains an EVAR candidate though I suspect PTA of R CIA would be needed.  AAA is small and does not Meet repair criteria.  Non-Invasive Vascular Imaging  CAROTID DUPLEX (Date: 02/02/11):   R ICA stenosis: 1-39%  R VA: patent and antegrade  L ICA stenosis: 1-39%  L VA: patent and antegrade  BLE Arterial Duplex (Date: 02/02/11)  RLE: No pop aneurysm, triphasic waveforms  LLE: Pop ectasia (8.3 mm x 7.8 mm) with mild intramural thrombus,  triphasic waveforms  Medical Decision Making  The patient is a 70 y.o. male who presents with: small asx AAA and R popliteal ectasia   Based on this patient's exam and diagnostic studies, he needs annual AAA duplex an LLE arterial duplex.. The patient will follow up in 1 year with these studies.  The threshold for repair is AAA size > 5.5 cm, growth > 1 cm/yr, and symptomatic status.  The threshold for popliteal aneurysm repair is: 2 cm, extensive thrombus, or sx.   I emphasized the importance of maximal medical management including strict control of blood pressure, blood glucose, and lipid levels, obtaining regular exercise, and cessation of smoking.    Thank you for allowing [place] to participate in this patient's care.  Leonides Sake, MD [facility] [place] Office: [phone] Pager: [phone]

## 2011-02-12 NOTE — Procedures (Unsigned)
CAROTID DUPLEX EXAM  INDICATION:  Right carotid bruit.  HISTORY: Diabetes:  No. Cardiac:  Yes. Hypertension:  Yes. Smoking:  Yes. Previous Surgery:  No. CV History: Amaurosis Fugax No, Paresthesias No, Hemiparesis No.                                      RIGHT               LEFT Brachial systolic pressure: Brachial Doppler waveforms: Vertebral direction of flow:        Antegrade           Antegrade DUPLEX VELOCITIES (cm/sec) CCA peak systolic                   83                  42 ECA peak systolic                   96                  67 ICA peak systolic                   59                  42 ICA end diastolic                   20                  16 PLAQUE MORPHOLOGY:                  Heterogenous/calcific                 Heterogenous PLAQUE AMOUNT:                      Mild                Mild PLAQUE LOCATION:                    ICA                 ICA  IMPRESSION: 1. 1% to 39% bilateral internal carotid artery plaquing. 2. Antegrade vertebral arteries bilaterally.   ___________________________________________ Fransisco Hertz, MD  LT/MEDQ  D:  02/02/2011  T:  02/02/2011  Job:  [id number]

## 2011-05-11 ENCOUNTER — Other Ambulatory Visit: Payer: Self-pay | Admitting: Cardiology

## 2011-11-02 ENCOUNTER — Other Ambulatory Visit: Payer: Self-pay | Admitting: Family Medicine

## 2011-11-02 DIAGNOSIS — I729 Aneurysm of unspecified site: Secondary | ICD-10-CM

## 2011-11-12 ENCOUNTER — Ambulatory Visit (HOSPITAL_COMMUNITY)
Admission: RE | Admit: 2011-11-12 | Discharge: 2011-11-12 | Disposition: A | Discharge status: Home / Self Care | Payer: 59 | Source: Ambulatory Visit | Attending: Family Medicine | Admitting: Family Medicine

## 2011-11-12 DIAGNOSIS — I729 Aneurysm of unspecified site: Secondary | ICD-10-CM

## 2011-11-12 DIAGNOSIS — Z125 Encounter for screening for malignant neoplasm of prostate: Secondary | ICD-10-CM | POA: Diagnosis not present

## 2011-11-12 DIAGNOSIS — R5383 Other fatigue: Secondary | ICD-10-CM | POA: Diagnosis not present

## 2011-11-12 DIAGNOSIS — I714 Abdominal aortic aneurysm, without rupture, unspecified: Secondary | ICD-10-CM | POA: Insufficient documentation

## 2011-11-12 DIAGNOSIS — E782 Mixed hyperlipidemia: Secondary | ICD-10-CM | POA: Diagnosis not present

## 2011-11-12 DIAGNOSIS — E785 Hyperlipidemia, unspecified: Secondary | ICD-10-CM | POA: Diagnosis not present

## 2011-11-12 DIAGNOSIS — R7301 Impaired fasting glucose: Secondary | ICD-10-CM | POA: Diagnosis not present

## 2011-11-12 DIAGNOSIS — Z09 Encounter for follow-up examination after completed treatment for conditions other than malignant neoplasm: Secondary | ICD-10-CM | POA: Insufficient documentation

## 2011-11-12 DIAGNOSIS — R5381 Other malaise: Secondary | ICD-10-CM | POA: Diagnosis not present

## 2011-11-12 DIAGNOSIS — Z79899 Other long term (current) drug therapy: Secondary | ICD-10-CM | POA: Diagnosis not present

## 2011-12-07 ENCOUNTER — Other Ambulatory Visit: Payer: Self-pay | Admitting: Cardiology

## 2012-02-05 ENCOUNTER — Other Ambulatory Visit: Payer: Self-pay | Admitting: *Deleted

## 2012-02-05 DIAGNOSIS — I714 Abdominal aortic aneurysm, without rupture: Secondary | ICD-10-CM

## 2012-02-05 DIAGNOSIS — I70219 Atherosclerosis of native arteries of extremities with intermittent claudication, unspecified extremity: Secondary | ICD-10-CM

## 2012-02-07 ENCOUNTER — Encounter: Payer: Self-pay | Admitting: Vascular Surgery

## 2012-02-08 ENCOUNTER — Ambulatory Visit: Payer: 59 | Admitting: Vascular Surgery

## 2012-05-08 ENCOUNTER — Other Ambulatory Visit: Payer: Self-pay | Admitting: Family Medicine

## 2012-05-08 DIAGNOSIS — I714 Abdominal aortic aneurysm, without rupture: Secondary | ICD-10-CM

## 2012-05-12 ENCOUNTER — Ambulatory Visit (HOSPITAL_COMMUNITY)
Admission: RE | Admit: 2012-05-12 | Discharge: 2012-05-12 | Disposition: A | Discharge status: Home / Self Care | Payer: 59 | Source: Ambulatory Visit | Attending: Family Medicine | Admitting: Family Medicine

## 2012-05-12 DIAGNOSIS — I714 Abdominal aortic aneurysm, without rupture, unspecified: Secondary | ICD-10-CM | POA: Insufficient documentation

## 2012-05-12 DIAGNOSIS — Z09 Encounter for follow-up examination after completed treatment for conditions other than malignant neoplasm: Secondary | ICD-10-CM | POA: Insufficient documentation

## 2012-07-31 ENCOUNTER — Telehealth: Payer: Self-pay | Admitting: Family Medicine

## 2012-07-31 NOTE — Telephone Encounter (Signed)
Nitroglycerin tablets refill.  Advised patient to call pharmacy, stated they are now using [facility].

## 2012-08-01 ENCOUNTER — Other Ambulatory Visit: Payer: Self-pay

## 2012-08-01 DIAGNOSIS — R079 Chest pain, unspecified: Secondary | ICD-10-CM

## 2012-08-01 MED ORDER — NITROGLYCERIN 0.4 MG SL SUBL: 0.4000 mg | SUBLINGUAL_TABLET | SUBLINGUAL | Status: DC | PRN

## 2012-08-01 NOTE — Telephone Encounter (Signed)
NTG 0.4mg     May dispense 2  bottle   Use as diredted 6 refills

## 2012-08-01 NOTE — Telephone Encounter (Signed)
Sent in refill on NTG 0.4mg   Use as directed 6 RFS to [facility]. Pharmacy. Wife Emitt Maglione) was notified.

## 2012-08-04 ENCOUNTER — Telehealth: Payer: Self-pay | Admitting: *Deleted

## 2012-08-04 NOTE — Telephone Encounter (Signed)
Pt wife app today, will bring info on husbands metoprolol dose

## 2012-08-12 ENCOUNTER — Encounter: Payer: Self-pay | Admitting: *Deleted

## 2012-08-13 ENCOUNTER — Ambulatory Visit (INDEPENDENT_AMBULATORY_CARE_PROVIDER_SITE_OTHER): Payer: 59 | Admitting: Family Medicine

## 2012-08-13 ENCOUNTER — Encounter: Payer: Self-pay | Admitting: Family Medicine

## 2012-08-13 VITALS — Temp 98.5°F | Ht 69.0 in | Wt 166.0 lb

## 2012-08-13 DIAGNOSIS — J209 Acute bronchitis, unspecified: Secondary | ICD-10-CM

## 2012-08-13 DIAGNOSIS — J441 Chronic obstructive pulmonary disease with (acute) exacerbation: Secondary | ICD-10-CM

## 2012-08-13 MED ORDER — LEVOFLOXACIN 500 MG PO TABS: 500.0000 mg | ORAL_TABLET | Freq: Every day | ORAL | Status: AC

## 2012-08-13 NOTE — Progress Notes (Signed)
  Subjective:    Patient ID: Tommy Turner, male    DOB: [birth year], 73 y.o.   MRN: [id number]  Cough This is a new problem. The current episode started 1 to 4 weeks ago. The problem has been gradually improving. The problem occurs every few minutes. The cough is productive of sputum. The symptoms are aggravated by dust and lying down. He has tried OTC cough suppressant for the symptoms.   Patient does have a history of CBD he denies hemoptysis he denies wheezing denies high fever chills states he is bringing up some mucoid drainage. His breathing is no worse than previous.   Review of Systems  Respiratory: Positive for cough.   no chest pressure tightness pain or swelling in the legs     Objective:   Physical Exam Eardrums normal throat normal neck no masses lungs are clear coarse cough noted heart regular not rest for distress       Assessment & Plan:  Bronchitis with possible sinusitis Levaquin 10 days as directed he should followup within the next 3 months for comprehensive checkup will

## 2012-08-13 NOTE — Patient Instructions (Signed)
If cough doesn't go away over the next three weeks follow up  Call sooner if worse

## 2012-09-12 ENCOUNTER — Telehealth: Payer: Self-pay | Admitting: Family Medicine

## 2012-09-12 NOTE — Telephone Encounter (Signed)
Left message to return call 

## 2012-09-12 NOTE — Telephone Encounter (Signed)
He may double up on his medicine as needed. He will probably need any prescription when he finishes 0.25. May call in Xanax 0.5 mg 1 twice a day when necessary #60 no refills he needs followup within 3 weeks to see how this is going.

## 2012-09-12 NOTE — Telephone Encounter (Signed)
Pt wants to know if he can up his dosage on his xanax, states that .25 mg BID is not helping him?

## 2012-09-15 ENCOUNTER — Other Ambulatory Visit: Payer: Self-pay | Admitting: *Deleted

## 2012-09-15 MED ORDER — ALPRAZOLAM 0.5 MG PO TABS: ORAL_TABLET | ORAL | Status: DC

## 2012-09-15 NOTE — Telephone Encounter (Signed)
Xanax 0.5mg  #60 one bid prn sent to [facility]. Wife notified he needs appt within 3 weeks.

## 2012-09-17 ENCOUNTER — Other Ambulatory Visit: Payer: Self-pay | Admitting: Family Medicine

## 2012-10-08 ENCOUNTER — Other Ambulatory Visit: Payer: Self-pay | Admitting: Family Medicine

## 2012-10-08 NOTE — Telephone Encounter (Signed)
Ok times 3 total

## 2012-10-21 ENCOUNTER — Other Ambulatory Visit: Payer: Self-pay | Admitting: Family Medicine

## 2012-10-21 DIAGNOSIS — I714 Abdominal aortic aneurysm, without rupture: Secondary | ICD-10-CM

## 2012-10-23 ENCOUNTER — Other Ambulatory Visit: Payer: Self-pay | Admitting: *Deleted

## 2012-10-30 ENCOUNTER — Ambulatory Visit (INDEPENDENT_AMBULATORY_CARE_PROVIDER_SITE_OTHER): Payer: 59 | Admitting: Nurse Practitioner

## 2012-10-30 ENCOUNTER — Encounter: Payer: Self-pay | Admitting: Nurse Practitioner

## 2012-10-30 VITALS — BP 102/60 | Temp 97.5°F | Wt 160.0 lb

## 2012-10-30 DIAGNOSIS — F419 Anxiety disorder, unspecified: Secondary | ICD-10-CM

## 2012-10-30 DIAGNOSIS — F411 Generalized anxiety disorder: Secondary | ICD-10-CM

## 2012-10-30 DIAGNOSIS — J449 Chronic obstructive pulmonary disease, unspecified: Secondary | ICD-10-CM

## 2012-10-30 MED ORDER — CITALOPRAM HYDROBROMIDE 20 MG PO TABS: ORAL_TABLET | ORAL | Status: DC

## 2012-10-30 MED ORDER — ALPRAZOLAM 1 MG PO TABS: ORAL_TABLET | ORAL | Status: DC

## 2012-10-31 DIAGNOSIS — K219 Gastro-esophageal reflux disease without esophagitis: Secondary | ICD-10-CM | POA: Insufficient documentation

## 2012-10-31 DIAGNOSIS — I2510 Atherosclerotic heart disease of native coronary artery without angina pectoris: Secondary | ICD-10-CM | POA: Insufficient documentation

## 2012-10-31 DIAGNOSIS — E785 Hyperlipidemia, unspecified: Secondary | ICD-10-CM | POA: Insufficient documentation

## 2012-10-31 DIAGNOSIS — J449 Chronic obstructive pulmonary disease, unspecified: Secondary | ICD-10-CM | POA: Insufficient documentation

## 2012-10-31 DIAGNOSIS — F419 Anxiety disorder, unspecified: Secondary | ICD-10-CM | POA: Insufficient documentation

## 2012-10-31 NOTE — Progress Notes (Signed)
Subjective:  Presents for followup. Has not taken his Wellbutrin for several weeks, did not seem to help his anxiety. Has steadily had increased stress over the past few months, his grandchildren are now living with him and his wife. Anxiety levels have been excessive. No acid reflux or heartburn. Patient lost about 10 pounds recently due to decreased appetite related to his anxiety. Has put some of the weight back on, appetite has improved. Trouble sleeping due to anxiety. No abdominal pain. Has appointment scheduled with a specialist for his aneurysm. Smoking about one pack per day, does not feel he can cut back on this at this point due to his level of stress. No change in the amount of cough or mucus production. Mucus is clear. Shortness of breath mainly when he is exposed to extreme heat, tries to stay in air conditioning as much as possible.  Objective:   BP 102/60  Temp(Src) 97.5 F (36.4 C) (Oral)  Wt 160 lb (72.576 kg)  BMI 23.62 kg/m2 NAD. Alert, oriented. Mildly anxious affect. Lungs breath sounds diminished but clear. Heart regular rate rhythm. Abdomen soft nondistended nontender.  Assessment:COPD (chronic obstructive pulmonary disease)  Anxiety  Plan: Stop Wellbutrin. Switched to Celexa 20 mg a half tab by mouth daily x6 days then 1 by mouth daily. DC med and call if any adverse effects. Change Xanax to 1 mg half in the morning, half in the afternoon and 1 at bedtime as needed. Patient has several family members who has addiction problems. Reminded patient to keep his medications hidden. Recheck here in one month, call back sooner if any problems.

## 2012-10-31 NOTE — Assessment & Plan Note (Signed)
Stable.  Continue current regimen. Patient does not feel he can stop smoking at this point due to his level of stress.

## 2012-10-31 NOTE — Assessment & Plan Note (Signed)
Plan: Stop Wellbutrin. Switched to Celexa 20 mg a half tab by mouth daily x6 days then 1 by mouth daily. DC med and call if any adverse effects. Change Xanax to 1 mg half in the morning, half in the afternoon and 1 at bedtime as needed. Patient has several family members who has addiction problems. Reminded patient to keep his medications hidden. Recheck here in one month, call back sooner if any problems.

## 2012-11-03 ENCOUNTER — Other Ambulatory Visit: Payer: Self-pay | Admitting: Family Medicine

## 2012-12-01 ENCOUNTER — Telehealth: Payer: Self-pay | Admitting: Nurse Practitioner

## 2012-12-01 MED ORDER — CITALOPRAM HYDROBROMIDE 20 MG PO TABS: ORAL_TABLET | ORAL | Status: DC

## 2012-12-01 NOTE — Telephone Encounter (Signed)
Pt states his citalopram (CELEXA) 20 MG tablet is working well and would like to go ahead with some refills to the Outpatient Pharm

## 2012-12-01 NOTE — Telephone Encounter (Signed)
Rx sent electronically to [facility]. Patient notified.

## 2012-12-01 NOTE — Telephone Encounter (Signed)
celexa 20 mg, #90, 2 refills, f/u by mid fall

## 2012-12-04 ENCOUNTER — Encounter: Payer: Self-pay | Admitting: Vascular Surgery

## 2012-12-05 ENCOUNTER — Encounter (INDEPENDENT_AMBULATORY_CARE_PROVIDER_SITE_OTHER): Payer: 59 | Admitting: *Deleted

## 2012-12-05 ENCOUNTER — Ambulatory Visit (INDEPENDENT_AMBULATORY_CARE_PROVIDER_SITE_OTHER): Payer: 59 | Admitting: Vascular Surgery

## 2012-12-05 ENCOUNTER — Encounter: Payer: 59 | Admitting: Vascular Surgery

## 2012-12-05 ENCOUNTER — Encounter: Payer: Self-pay | Admitting: Vascular Surgery

## 2012-12-05 VITALS — BP 119/73 | HR 56 | Resp 16 | Ht 63.0 in | Wt 152.0 lb

## 2012-12-05 DIAGNOSIS — I714 Abdominal aortic aneurysm, without rupture: Secondary | ICD-10-CM | POA: Diagnosis not present

## 2012-12-05 DIAGNOSIS — I724 Aneurysm of artery of lower extremity: Secondary | ICD-10-CM

## 2012-12-05 DIAGNOSIS — I70219 Atherosclerosis of native arteries of extremities with intermittent claudication, unspecified extremity: Secondary | ICD-10-CM

## 2012-12-05 DIAGNOSIS — I739 Peripheral vascular disease, unspecified: Secondary | ICD-10-CM | POA: Diagnosis not present

## 2012-12-05 NOTE — Progress Notes (Signed)
[facility] [place]  Established Abdominal Aortic Aneurysm  History of Present Illness  The patient is a 72 y.o. (May 16, 1940) male who presents with chief complaint: follow up for AAA.  Previous studies demonstrate an AAA, measuring 4.8 cm, and also popliteal ectasias.  The patient does not have back or abdominal pain.  The patient is an active smoker.  The patient denies any leg symptoms.  The patient's PMH, PSH, SH, FamHx, Med, and Allergies are unchanged from 02/02/11.  On ROS today: no back pain, no claudication or rest pain  Physical Examination  Filed Vitals:   12/05/12 0941  BP: 119/73  Pulse: 56  Resp: 16  Height: 5\' 3"  (1.6 m)  Weight: 152 lb (68.947 kg)  SpO2: 94%   Body mass index is 26.93 kg/(m^2).  General: A&O x 3, WDWN  Pulmonary: Sym exp, good air movt, CTAB, no rales, rhonchi, & wheezing  Cardiac: RRR, Nl S1, S2, no Murmurs, rubs or gallops  Vascular: Vessel Right Left  Radial Palpable Palpable  Brachial Palpable Palpable  Carotid Palpable, without bruit Palpable, without bruit  Aorta Palpable N/A  Femoral Palpable Palpable  Popliteal Palpable Palpable  PT Palpable Palpable  DP Palpable Palpable   Gastrointestinal: soft, NTND, -G/R, - HSM, - masses, - CVAT B, palpable aorta without aneurysmal character  Musculoskeletal: M/S 5/5 throughout , Extremities without ischemic changes   Neurologic: Pain and light touch intact in extremities , Motor exam as listed above  Non-Invasive Vascular Imaging  AAA Duplex (12/05/2012)  Previous size: 4.8 cm (Date: 05/12/12)  Current size:  4.77 cm x 4.473 cm (Date: 12/05/2012)  R CIA 0.83  L CIA 1.7 cm  BLE arterial duplex (12/05/2012)  R 0.64 cm  L 0.66 cm  Medical Decision Making  The patient is a 72 y.o. male who presents with: asymptomatic AAA with stable size, stable L CIA aneurysm, stable B popliteal ectasias   Based on this patient's exam and diagnostic studies, he needs continued  q6 month aortic surveillance with annual B popliteal duplex.  The threshold for repair is AAA size > 5.5 cm, growth > 1 cm/yr, and symptomatic status.  I have reiterated the need for smoking cessation again, as it has been documented as the #1 risk factor for aneurysmal growth.  I emphasized the importance of maximal medical management including strict control of blood pressure, blood glucose, and lipid levels, antiplatelet agents, obtaining regular exercise, and cessation of smoking.    Thank you for allowing [place] to participate in this patient's care.  Leonides Sake, MD [facility] [place] Office: [phone] Pager: [phone]  12/05/2012, 10:11 AM

## 2012-12-08 NOTE — Addendum Note (Signed)
Addended by: Sharee Pimple on: 12/08/2012 09:40 AM   Modules accepted: Orders

## 2012-12-12 ENCOUNTER — Other Ambulatory Visit: Payer: Self-pay | Admitting: *Deleted

## 2012-12-12 DIAGNOSIS — I724 Aneurysm of artery of lower extremity: Secondary | ICD-10-CM

## 2012-12-12 DIAGNOSIS — I739 Peripheral vascular disease, unspecified: Secondary | ICD-10-CM

## 2013-01-08 ENCOUNTER — Other Ambulatory Visit: Payer: Self-pay | Admitting: Family Medicine

## 2013-01-30 ENCOUNTER — Other Ambulatory Visit: Payer: Self-pay | Admitting: Family Medicine

## 2013-01-30 NOTE — Telephone Encounter (Signed)
Ok times one 

## 2013-02-09 ENCOUNTER — Other Ambulatory Visit: Payer: Self-pay | Admitting: Family Medicine

## 2013-02-17 ENCOUNTER — Ambulatory Visit (INDEPENDENT_AMBULATORY_CARE_PROVIDER_SITE_OTHER): Payer: 59 | Admitting: Family Medicine

## 2013-02-17 ENCOUNTER — Encounter: Payer: Self-pay | Admitting: Family Medicine

## 2013-02-17 VITALS — BP 136/72 | HR 60 | Temp 97.7°F | Wt 159.0 lb

## 2013-02-17 DIAGNOSIS — F17200 Nicotine dependence, unspecified, uncomplicated: Secondary | ICD-10-CM

## 2013-02-17 DIAGNOSIS — I10 Essential (primary) hypertension: Secondary | ICD-10-CM

## 2013-02-17 DIAGNOSIS — Z125 Encounter for screening for malignant neoplasm of prostate: Secondary | ICD-10-CM

## 2013-02-17 DIAGNOSIS — J449 Chronic obstructive pulmonary disease, unspecified: Secondary | ICD-10-CM

## 2013-02-17 DIAGNOSIS — R634 Abnormal weight loss: Secondary | ICD-10-CM

## 2013-02-17 DIAGNOSIS — R05 Cough: Secondary | ICD-10-CM

## 2013-02-17 DIAGNOSIS — E782 Mixed hyperlipidemia: Secondary | ICD-10-CM

## 2013-02-17 NOTE — Progress Notes (Signed)
  Subjective:    Patient ID: Tommy Turner, male    DOB: [birth year], 72 y.o.   MRN: [id number]  Breathing Problem He complains of difficulty breathing and frequent throat clearing. This is a new problem. The current episode started in the past 7 days.   Patient having significant weight loss this been going on over the past several months he relates a lot of coughing congestion shortness of breath brings up phlegm denies hemoptysis. He is a long-term smoker. In addition to this he denies abdominal pain rectal bleeding. He has never had a colonoscopy. He denies sweats chills he denies joint pain. Energy level overall pretty good. He has an extensive history including heart disease hypertension COPD he also states he does not do a good job eating he often skips meals and only eats a little bit per day he also states that wreathing kerosene fumes from a heater bothers him I've advised him that he ought to stop using a kerosene heater.  Review of Systems Positive shortness breath negative for chest pain negative for swelling in the legs and no other particular troubles.    Objective:   Physical Exam Lungs are clear hearts regular patient is thin he's lost 7 pounds compared to last visit and about 10 this year. Heart is regular abdomen soft extremities no edema skin warm dry       Assessment & Plan:  Emphysema-quit smoking, continue inhalers as directed Weight loss with cough-high risk of cancer. CT scan of chest because of persistent cough and weight loss and smoking history. Abdominal aneurysm stable Hypertension stable Patient will need colonoscopy but wait till blood work at the scan is finished

## 2013-02-17 NOTE — Patient Instructions (Addendum)
Lungs are stable  Do your blood work in the am  I am concerned about your weight loss:(you need to eat More!)  1) do your labs 2) CT scan of the lungs 3) you will need a colonoscopy as well but first do the labs and the scan 4) keep appt for [place] in November  Also avoid kerosene heater if possible

## 2013-02-24 ENCOUNTER — Ambulatory Visit (HOSPITAL_COMMUNITY)
Admission: RE | Admit: 2013-02-24 | Discharge: 2013-02-24 | Disposition: A | Discharge status: Home / Self Care | Payer: 59 | Source: Ambulatory Visit | Attending: Family Medicine | Admitting: Family Medicine

## 2013-02-24 DIAGNOSIS — Z951 Presence of aortocoronary bypass graft: Secondary | ICD-10-CM | POA: Diagnosis not present

## 2013-02-24 DIAGNOSIS — R634 Abnormal weight loss: Secondary | ICD-10-CM | POA: Insufficient documentation

## 2013-02-24 DIAGNOSIS — R059 Cough, unspecified: Secondary | ICD-10-CM | POA: Insufficient documentation

## 2013-02-24 DIAGNOSIS — F17200 Nicotine dependence, unspecified, uncomplicated: Secondary | ICD-10-CM

## 2013-02-24 DIAGNOSIS — R05 Cough: Secondary | ICD-10-CM | POA: Diagnosis present

## 2013-02-24 DIAGNOSIS — R0602 Shortness of breath: Secondary | ICD-10-CM | POA: Diagnosis not present

## 2013-02-24 DIAGNOSIS — J438 Other emphysema: Secondary | ICD-10-CM | POA: Insufficient documentation

## 2013-02-24 DIAGNOSIS — Z87891 Personal history of nicotine dependence: Secondary | ICD-10-CM | POA: Diagnosis not present

## 2013-02-24 LAB — HEPATIC FUNCTION PANEL
ALT: 9 U/L (ref 0–53)
Alkaline Phosphatase: 75 U/L (ref 39–117)
Indirect Bilirubin: 0.4 mg/dL (ref 0.0–0.9)
Total Protein: 6.3 g/dL (ref 6.0–8.3)

## 2013-02-24 LAB — BASIC METABOLIC PANEL
BUN: 10 mg/dL (ref 6–23)
Chloride: 100 mEq/L (ref 96–112)
Creat: 0.69 mg/dL (ref 0.50–1.35)
Glucose, Bld: 107 mg/dL — ABNORMAL HIGH (ref 70–99)

## 2013-02-24 LAB — LIPID PANEL
Cholesterol: 121 mg/dL (ref 0–200)
Triglycerides: 76 mg/dL (ref <150)

## 2013-02-24 LAB — PSA, MEDICARE: PSA: 3.79 ng/mL (ref <=4.00)

## 2013-02-25 ENCOUNTER — Encounter: Payer: Self-pay | Admitting: Family Medicine

## 2013-03-02 ENCOUNTER — Other Ambulatory Visit: Payer: Self-pay | Admitting: Family Medicine

## 2013-03-02 ENCOUNTER — Encounter: Payer: Self-pay | Admitting: Gastroenterology

## 2013-03-02 DIAGNOSIS — R634 Abnormal weight loss: Secondary | ICD-10-CM

## 2013-03-12 ENCOUNTER — Ambulatory Visit (INDEPENDENT_AMBULATORY_CARE_PROVIDER_SITE_OTHER): Payer: 59 | Admitting: Family Medicine

## 2013-03-12 ENCOUNTER — Encounter: Payer: Self-pay | Admitting: Family Medicine

## 2013-03-12 VITALS — BP 132/80 | Ht 68.0 in | Wt 156.6 lb

## 2013-03-12 DIAGNOSIS — R5381 Other malaise: Secondary | ICD-10-CM

## 2013-03-12 DIAGNOSIS — Z23 Encounter for immunization: Secondary | ICD-10-CM

## 2013-03-12 DIAGNOSIS — R634 Abnormal weight loss: Secondary | ICD-10-CM

## 2013-03-12 LAB — POCT HEMOGLOBIN: Hemoglobin: 16.7 g/dL (ref 14.1–18.1)

## 2013-03-12 NOTE — Progress Notes (Signed)
  Subjective:    Patient ID: Tommy Turner, male    DOB: [birth year], 72 y.o.   MRN: [id number]  HPI Patient arrives to follow up on Ct scan and blood work results.   Review of Systems     Objective:   Physical Exam        Assessment & Plan:  20 minutes was spent with this patient discussing his test results. CAT scan does not show any tumors. He has lost a couple more pounds. He is a smoker. He has emphysema. He states he's trying to eat better. He denies blood in his bowel movements. Lab work was reviewed. Overall normal. Fingerstick hemoglobin normal.  Weight loss-referral to gastroenterology for scope exams to see if there is underlying source to his weight loss. Patient states he gets full quickly I believe this patient needs EGD and colonoscopy.  He will followup with [place] a couple weeks after his scope test.

## 2013-03-18 ENCOUNTER — Ambulatory Visit (INDEPENDENT_AMBULATORY_CARE_PROVIDER_SITE_OTHER): Payer: 59 | Admitting: Gastroenterology

## 2013-03-18 ENCOUNTER — Encounter: Payer: Self-pay | Admitting: Gastroenterology

## 2013-03-18 VITALS — BP 146/72 | HR 64 | Temp 97.6°F | Wt 156.8 lb

## 2013-03-18 DIAGNOSIS — R634 Abnormal weight loss: Secondary | ICD-10-CM | POA: Insufficient documentation

## 2013-03-18 DIAGNOSIS — R6881 Early satiety: Secondary | ICD-10-CM | POA: Diagnosis not present

## 2013-03-18 DIAGNOSIS — R1310 Dysphagia, unspecified: Secondary | ICD-10-CM | POA: Diagnosis not present

## 2013-03-18 MED ORDER — PEG 3350-KCL-NA BICARB-NACL 420 G PO SOLR: 4000.0000 mL | ORAL | Status: DC

## 2013-03-18 NOTE — Assessment & Plan Note (Signed)
EGD/ED as planned.  

## 2013-03-18 NOTE — Assessment & Plan Note (Signed)
Unclear etiology. EGD/ED as planned.

## 2013-03-18 NOTE — Patient Instructions (Signed)
We have scheduled you for a colonoscopy, upper endoscopy, and dilation with Dr. Jena Gauss in the near future.  You may need a scan of your belly if these procedures are negative. We will provide further recommendations once they are completed.

## 2013-03-18 NOTE — Assessment & Plan Note (Signed)
72 year old male presents today with steady weight loss over the past 2 years of close to 20 lbs, 10 of those in the last few months. Associated symptoms include early satiety and vague esophageal dysphagia. Denies any GERD symptoms, rectal bleeding, melena, abdominal pain, or change in bowel habits. CT chest negative, Hgb normal through PCP office.   As he has never had a colonoscopy, needs screening colonoscopy now and upper EGD as well to assess for occult malignancy. Notably, he does have a history of PUD, requiring gastrectomy in the 1990s. Op notes requested from [facility]. Dysphagia may be secondary to web, ring, or stricture. Unable to exclude malignancy. If EGD/TCS negative, proceed with CT of abdomen.   Proceed with TCS/EGD/ED with Dr. Jena Gauss in near future: the risks, benefits, and alternatives have been discussed with the patient in detail. The patient states understanding and desires to proceed.

## 2013-03-18 NOTE — Progress Notes (Signed)
Primary Care Physician:  Lilyan Punt, MD Primary Gastroenterologist:  Dr. Jena Gauss   Chief Complaint  Patient presents with  . Colonoscopy  . Weight Loss    HPI:   [name] presents today at the request of Dr. Lilyan Punt secondary to weight loss, early satiety. CT chest Oct 2014 without evidence of malignancy. Remote history of a gastrectomy secondary to non-healing ulcers in the setting of massive amounts of Alka-Seltzer daily. Performed by Dr. Elpidio Anis at [facility] in the 1990s. Op notes not available at time of visit. No prior colonoscopy. No rectal bleeding. No melena. Decreased oral intake, decreased appetite. Early satiety. Symptoms for about a year. Baseline weight in the 180s, now 156. 10 pounds down total from April 2014. No abdominal pain. Vague solid food dysphagia. No changes in bowel habits.   Past Medical History  Diagnosis Date  . AAA (abdominal aortic aneurysm)   . Cough   . Shortness of breath   . COPD (chronic obstructive pulmonary disease)   . Hyperlipidemia   . CAD (coronary artery disease)   . Hypertension   . Ulcer     gastric  . Aneurysm   . Prediabetes     Past Surgical History  Procedure Laterality Date  . Lipoma excision      right lower back  . Partial gastrectomy      approx 1990  . Cardiac surgery      Current Outpatient Prescriptions  Medication Sig Dispense Refill  . albuterol (PROVENTIL HFA;VENTOLIN HFA) 108 (90 BASE) MCG/ACT inhaler Inhale 2 puffs into the lungs every 6 (six) hours as needed for wheezing.      Marland Kitchen albuterol (PROVENTIL) (2.5 MG/3ML) 0.083% nebulizer solution USE 1 VIAL VIA NEBULIZER EVERY 4 HOURS AS NEEDED  150 mL  11  . ALPRAZolam (XANAX) 1 MG tablet 1/2 in am; 1/2 in the afternoon and one po q Hs prn nerves  60 tablet  0  . aspirin 81 MG tablet Take 81 mg by mouth daily.        . benazepril (LOTENSIN) 40 MG tablet TAKE 1 TABLET BY MOUTH DAILY  90 tablet  1  . citalopram (CELEXA) 20 MG tablet 1/2 tab at HS x 6 days then  one po q HS  90 tablet  2  . furosemide (LASIX) 20 MG tablet Take 20 mg by mouth. Take 2 tabs in am daily      . HYDROcodone-acetaminophen (NORCO) 10-325 MG per tablet TAKE 1 TABLET BY MOUTH TWICE DAILY AS NEEDED  60 tablet  0  . metoprolol (TOPROL-XL) 50 MG 24 hr tablet Take 50 mg by mouth daily.        . nitroGLYCERIN (NITROSTAT) 0.4 MG SL tablet Place 1 tablet (0.4 mg total) under the tongue every 5 (five) minutes as needed for chest pain.  100 tablet  6  . potassium chloride SA (K-DUR,KLOR-CON) 20 MEQ tablet Take 20 mEq by mouth daily.        . pravastatin (PRAVACHOL) 40 MG tablet TAKE 1 TABLET BY MOUTH EVERY DAY AT BEDTIME  30 tablet  3  . tiotropium (SPIRIVA) 18 MCG inhalation capsule Place 18 mcg into inhaler and inhale as needed.        No current facility-administered medications for this visit.    Allergies as of 03/18/2013  . (No Known Allergies)    Family History  Problem Relation Age of Onset  . Hypertension Brother   . Heart disease Brother   . Cancer  Mother   . Cancer Father     prostate  . COPD Sister   . Colon cancer Neg Hx     History   Social History  . Marital Status: Married    Spouse Name: N/A    Number of Children: N/A  . Years of Education: N/A   Occupational History  . Not on file.   Social History Main Topics  . Smoking status: Current Every Day Smoker -- 0.50 packs/day for 55 years    Types: Cigarettes  . Smokeless tobacco: Never Used  . Alcohol Use: No  . Drug Use: No  . Sexual Activity: Not on file   Other Topics Concern  . Not on file   Social History Narrative  . No narrative on file    Review of Systems: Gen: see HPI CV: Denies chest pain, heart palpitations, peripheral edema, syncope.  Resp: +DOE GI: see HPI GU : Denies urinary burning, urinary frequency, urinary hesitancy MS: Denies joint pain, muscle weakness, cramps, or limitation of movement.  Derm: Denies rash, itching, dry skin Psych: Denies depression, anxiety,  memory loss, and confusion Heme: Denies bruising, bleeding, and enlarged lymph nodes.  Physical Exam: BP 146/72  Pulse 64  Temp(Src) 97.6 F (36.4 C) (Oral)  Wt 156 lb 12.8 oz (71.124 kg) General:   Alert and oriented. Pleasant and cooperative. Well-nourished and well-developed.  Head:  Normocephalic and atraumatic. Eyes:  Without icterus, sclera clear and conjunctiva pink.  Ears:  Normal auditory acuity. Nose:  No deformity, discharge,  or lesions. Mouth:  No deformity or lesions, oral mucosa pink.  Neck:  Supple, without mass or thyromegaly. Lungs:  Mild expiratory wheezing in bilateral lower bases.   Heart:  S1, S2 present without murmurs appreciated.  Abdomen:  +BS, soft, non-tender and non-distended. No HSM noted. No guarding or rebound. Patient "tensing" during exam, so difficult to adequately assess for any mass Rectal:  Deferred  Msk:  Symmetrical without gross deformities. Normal posture. Extremities:  Without clubbing or edema. Neurologic:  Alert and  oriented x4;  grossly normal neurologically. Skin:  Intact without significant lesions or rashes. Psych:  Alert and cooperative. Normal mood and affect.   Lab Results  Component Value Date   HGB 16.7 03/12/2013   Lab Results  Component Value Date   ALT 9 02/24/2013   AST 17 02/24/2013   ALKPHOS 75 02/24/2013   BILITOT 0.6 02/24/2013   Lab Results  Component Value Date   CREATININE 0.69 02/24/2013   BUN 10 02/24/2013   NA 140 02/24/2013   K 4.6 02/24/2013   CL 100 02/24/2013   CO2 33* 02/24/2013

## 2013-03-18 NOTE — Progress Notes (Signed)
cc'd to pcp 

## 2013-04-01 ENCOUNTER — Telehealth: Payer: Self-pay | Admitting: Family Medicine

## 2013-04-01 MED ORDER — HYDROCODONE-ACETAMINOPHEN 10-325 MG PO TABS: 1.0000 | ORAL_TABLET | Freq: Two times a day (BID) | ORAL | Status: DC | PRN

## 2013-04-01 NOTE — Telephone Encounter (Signed)
May give script for #60 ( was seen early in Nov)

## 2013-04-01 NOTE — Telephone Encounter (Signed)
Patient would like Rx for hydrocodone. °

## 2013-04-01 NOTE — Telephone Encounter (Signed)
Pt's wife notified.

## 2013-04-05 ENCOUNTER — Emergency Department (HOSPITAL_COMMUNITY): Payer: 59

## 2013-04-05 ENCOUNTER — Encounter (HOSPITAL_COMMUNITY): Payer: Self-pay | Admitting: Emergency Medicine

## 2013-04-05 ENCOUNTER — Inpatient Hospital Stay (HOSPITAL_COMMUNITY)
Admission: EM | Admit: 2013-04-05 | Discharge: 2013-04-12 | DRG: 190 | Disposition: A | Discharge status: Home Health | Payer: 59 | Attending: Internal Medicine | Admitting: Internal Medicine

## 2013-04-05 DIAGNOSIS — J9600 Acute respiratory failure, unspecified whether with hypoxia or hypercapnia: Secondary | ICD-10-CM | POA: Diagnosis present

## 2013-04-05 DIAGNOSIS — J438 Other emphysema: Secondary | ICD-10-CM | POA: Diagnosis not present

## 2013-04-05 DIAGNOSIS — F17200 Nicotine dependence, unspecified, uncomplicated: Secondary | ICD-10-CM | POA: Diagnosis not present

## 2013-04-05 DIAGNOSIS — Z8249 Family history of ischemic heart disease and other diseases of the circulatory system: Secondary | ICD-10-CM

## 2013-04-05 DIAGNOSIS — R1310 Dysphagia, unspecified: Secondary | ICD-10-CM

## 2013-04-05 DIAGNOSIS — J4489 Other specified chronic obstructive pulmonary disease: Secondary | ICD-10-CM

## 2013-04-05 DIAGNOSIS — R0902 Hypoxemia: Secondary | ICD-10-CM

## 2013-04-05 DIAGNOSIS — R6881 Early satiety: Secondary | ICD-10-CM

## 2013-04-05 DIAGNOSIS — J9601 Acute respiratory failure with hypoxia: Secondary | ICD-10-CM | POA: Diagnosis present

## 2013-04-05 DIAGNOSIS — R739 Hyperglycemia, unspecified: Secondary | ICD-10-CM | POA: Diagnosis not present

## 2013-04-05 DIAGNOSIS — T380X5A Adverse effect of glucocorticoids and synthetic analogues, initial encounter: Secondary | ICD-10-CM | POA: Diagnosis not present

## 2013-04-05 DIAGNOSIS — Z903 Acquired absence of stomach [part of]: Secondary | ICD-10-CM

## 2013-04-05 DIAGNOSIS — Z951 Presence of aortocoronary bypass graft: Secondary | ICD-10-CM

## 2013-04-05 DIAGNOSIS — J9819 Other pulmonary collapse: Secondary | ICD-10-CM | POA: Diagnosis not present

## 2013-04-05 DIAGNOSIS — I2510 Atherosclerotic heart disease of native coronary artery without angina pectoris: Secondary | ICD-10-CM

## 2013-04-05 DIAGNOSIS — J441 Chronic obstructive pulmonary disease with (acute) exacerbation: Principal | ICD-10-CM

## 2013-04-05 DIAGNOSIS — E785 Hyperlipidemia, unspecified: Secondary | ICD-10-CM | POA: Diagnosis present

## 2013-04-05 DIAGNOSIS — Z79899 Other long term (current) drug therapy: Secondary | ICD-10-CM

## 2013-04-05 DIAGNOSIS — J42 Unspecified chronic bronchitis: Secondary | ICD-10-CM

## 2013-04-05 DIAGNOSIS — R634 Abnormal weight loss: Secondary | ICD-10-CM

## 2013-04-05 DIAGNOSIS — I2789 Other specified pulmonary heart diseases: Secondary | ICD-10-CM | POA: Diagnosis present

## 2013-04-05 DIAGNOSIS — R7309 Other abnormal glucose: Secondary | ICD-10-CM | POA: Diagnosis not present

## 2013-04-05 DIAGNOSIS — J449 Chronic obstructive pulmonary disease, unspecified: Secondary | ICD-10-CM | POA: Diagnosis not present

## 2013-04-05 DIAGNOSIS — Z7982 Long term (current) use of aspirin: Secondary | ICD-10-CM

## 2013-04-05 DIAGNOSIS — F419 Anxiety disorder, unspecified: Secondary | ICD-10-CM

## 2013-04-05 DIAGNOSIS — I10 Essential (primary) hypertension: Secondary | ICD-10-CM | POA: Diagnosis present

## 2013-04-05 LAB — CBC WITH DIFFERENTIAL/PLATELET
Basophils Relative: 0 % (ref 0–1)
Eosinophils Absolute: 0 10*3/uL (ref 0.0–0.7)
HCT: 49.3 % (ref 39.0–52.0)
Hemoglobin: 16 g/dL (ref 13.0–17.0)
Lymphocytes Relative: 6 % — ABNORMAL LOW (ref 12–46)
Lymphs Abs: 0.9 10*3/uL (ref 0.7–4.0)
MCHC: 32.5 g/dL (ref 30.0–36.0)
MCV: 93.9 fL (ref 78.0–100.0)
Monocytes Absolute: 1.7 10*3/uL — ABNORMAL HIGH (ref 0.1–1.0)
Monocytes Relative: 11 % (ref 3–12)
Neutro Abs: 12.6 10*3/uL — ABNORMAL HIGH (ref 1.7–7.7)
Neutrophils Relative %: 83 % — ABNORMAL HIGH (ref 43–77)
Platelets: 120 10*3/uL — ABNORMAL LOW (ref 150–400)
RBC: 5.25 MIL/uL (ref 4.22–5.81)
WBC: 15.2 10*3/uL — ABNORMAL HIGH (ref 4.0–10.5)

## 2013-04-05 LAB — BASIC METABOLIC PANEL
BUN: 12 mg/dL (ref 6–23)
CO2: 30 mEq/L (ref 19–32)
Chloride: 93 mEq/L — ABNORMAL LOW (ref 96–112)
Creatinine, Ser: 0.54 mg/dL (ref 0.50–1.35)
GFR calc Af Amer: >90 mL/min (ref >90)
GFR calc non Af Amer: >90 mL/min (ref >90)
Potassium: 4.3 mEq/L (ref 3.5–5.1)

## 2013-04-05 MED ORDER — HEPARIN SODIUM (PORCINE) 5000 UNIT/ML IJ SOLN
5000.0000 [IU] | Freq: Three times a day (TID) | INTRAMUSCULAR | Status: DC
  Administered 2013-04-05 – 2013-04-12 (×19): 5000 [IU] via SUBCUTANEOUS
  Filled 2013-04-05 (×19): qty 1

## 2013-04-05 MED ORDER — SODIUM CHLORIDE 0.9 % IV SOLN: INTRAVENOUS | Status: DC

## 2013-04-05 MED ORDER — IPRATROPIUM BROMIDE 0.02 % IN SOLN
1.0000 mg | Freq: Once | RESPIRATORY_TRACT | Status: AC
  Administered 2013-04-05: 1 mg via RESPIRATORY_TRACT
  Filled 2013-04-05: qty 5

## 2013-04-05 MED ORDER — IPRATROPIUM BROMIDE 0.02 % IN SOLN: 0.5000 mg | RESPIRATORY_TRACT | Status: DC | PRN

## 2013-04-05 MED ORDER — ALBUTEROL (5 MG/ML) CONTINUOUS INHALATION SOLN
15.0000 mg/h | INHALATION_SOLUTION | Freq: Once | RESPIRATORY_TRACT | Status: AC
  Administered 2013-04-05: 15 mg/h via RESPIRATORY_TRACT
  Filled 2013-04-05: qty 20

## 2013-04-05 MED ORDER — SODIUM CHLORIDE 0.9 % IV SOLN
INTRAVENOUS | Status: DC
  Administered 2013-04-05: 16:00:00 via INTRAVENOUS

## 2013-04-05 MED ORDER — IPRATROPIUM BROMIDE 0.02 % IN SOLN
0.5000 mg | RESPIRATORY_TRACT | Status: DC
  Administered 2013-04-05 – 2013-04-12 (×39): 0.5 mg via RESPIRATORY_TRACT
  Filled 2013-04-05 (×39): qty 2.5

## 2013-04-05 MED ORDER — ALBUTEROL SULFATE (5 MG/ML) 0.5% IN NEBU: 2.5000 mg | INHALATION_SOLUTION | RESPIRATORY_TRACT | Status: DC

## 2013-04-05 MED ORDER — METHYLPREDNISOLONE SODIUM SUCC 125 MG IJ SOLR
60.0000 mg | Freq: Four times a day (QID) | INTRAMUSCULAR | Status: DC
  Administered 2013-04-05 – 2013-04-06 (×2): 60 mg via INTRAVENOUS
  Filled 2013-04-05 (×2): qty 2

## 2013-04-05 MED ORDER — ALBUTEROL SULFATE (5 MG/ML) 0.5% IN NEBU: 2.5000 mg | INHALATION_SOLUTION | RESPIRATORY_TRACT | Status: DC | PRN

## 2013-04-05 MED ORDER — METHYLPREDNISOLONE SODIUM SUCC 125 MG IJ SOLR
125.0000 mg | Freq: Once | INTRAMUSCULAR | Status: AC
  Administered 2013-04-05: 125 mg via INTRAVENOUS
  Filled 2013-04-05: qty 2

## 2013-04-05 MED ORDER — ACETAMINOPHEN 325 MG PO TABS
650.0000 mg | ORAL_TABLET | Freq: Four times a day (QID) | ORAL | Status: DC | PRN
  Administered 2013-04-08: 650 mg via ORAL
  Filled 2013-04-05: qty 2

## 2013-04-05 MED ORDER — MORPHINE SULFATE 2 MG/ML IJ SOLN: 2.0000 mg | INTRAMUSCULAR | Status: DC | PRN

## 2013-04-05 MED ORDER — NICOTINE 21 MG/24HR TD PT24
21.0000 mg | MEDICATED_PATCH | Freq: Every day | TRANSDERMAL | Status: DC
  Administered 2013-04-05 – 2013-04-12 (×8): 21 mg via TRANSDERMAL
  Filled 2013-04-05 (×8): qty 1

## 2013-04-05 MED ORDER — ONDANSETRON HCL 4 MG/2ML IJ SOLN: 4.0000 mg | Freq: Four times a day (QID) | INTRAMUSCULAR | Status: DC | PRN

## 2013-04-05 MED ORDER — ONDANSETRON HCL 4 MG PO TABS: 4.0000 mg | ORAL_TABLET | Freq: Four times a day (QID) | ORAL | Status: DC | PRN

## 2013-04-05 MED ORDER — ACETAMINOPHEN 650 MG RE SUPP: 650.0000 mg | Freq: Four times a day (QID) | RECTAL | Status: DC | PRN

## 2013-04-05 MED ORDER — ALBUTEROL SULFATE (5 MG/ML) 0.5% IN NEBU
2.5000 mg | INHALATION_SOLUTION | RESPIRATORY_TRACT | Status: DC
  Administered 2013-04-05 – 2013-04-12 (×39): 2.5 mg via RESPIRATORY_TRACT
  Filled 2013-04-05 (×39): qty 0.5

## 2013-04-05 NOTE — ED Notes (Signed)
Pt c/o SOB for past couple of days.  Reports has been working out in his building for the past 2 days with kerosene heater and doors closed.  Pt denies pain.  Reports productive cough with whitish colored sputum.  Pt has used inhaler x 4 today and nebulizer x 2.  Denies fever.  Reports was diaphoretic prior to arrival.

## 2013-04-05 NOTE — ED Notes (Signed)
Breathing treatment complete, placed pt on 02 at 2liters via [place].  Notified Jackquline Bosch RN.

## 2013-04-05 NOTE — H&P (Signed)
[facility] History and Physical  Tommy Turner ZHY:[id number] DOB: 1940-05-20 DOA: 04/05/2013  Referring physician: Emergency department PCP: Lilyan Punt, MD  Specialists:   Chief Complaint: SOB  HPI: Tommy Turner is a 72 y.o. male  With a hx of copd who presents to the ED with complaints of worsening SOB and wheezing, failing to respond to multiple neb treatments. Sx have persisted for the past several days. Reports being primarily indoors around a kerosene heater and believes he may have been breathing in the fumes. In the ED, pt was given a neb tx and 125mg  solumedrol with persistently decreased BS. Hospitalist was consulted for admission.  Review of Systems:  Per above, the remainder of the 10pt ros reviewed and are neg  Past Medical History  Diagnosis Date  . AAA (abdominal aortic aneurysm)   . Cough   . Shortness of breath   . COPD (chronic obstructive pulmonary disease)   . Hyperlipidemia   . CAD (coronary artery disease)   . Hypertension   . Ulcer     gastric  . Aneurysm   . Prediabetes    Past Surgical History  Procedure Laterality Date  . Lipoma excision      right lower back  . Partial gastrectomy      approx 1990  . Cardiac surgery     Social History:  reports that he has been smoking Cigarettes.  He has a 27.5 pack-year smoking history. He has never used smokeless tobacco. He reports that he does not drink alcohol or use illicit drugs.  where does patient live--home, ALF, SNF? and with whom if at home?  Can patient participate in ADLs?  No Known Allergies  Family History  Problem Relation Age of Onset  . Hypertension Brother   . Heart disease Brother   . Cancer Mother   . Cancer Father     prostate  . COPD Sister   . Colon cancer Neg Hx     (be sure to complete)  Prior to Admission medications   Medication Sig Start Date End Date Taking? Authorizing Provider  albuterol (PROVENTIL HFA;VENTOLIN HFA) 108 (90 BASE) MCG/ACT inhaler Inhale 2  puffs into the lungs every 6 (six) hours as needed for wheezing.    Historical Provider, MD  albuterol (PROVENTIL) (2.5 MG/3ML) 0.083% nebulizer solution USE 1 VIAL VIA NEBULIZER EVERY 4 HOURS AS NEEDED 09/17/12   Babs Sciara, MD  ALPRAZolam Prudy Feeler) 1 MG tablet 1/2 in am; 1/2 in the afternoon and one po q Hs prn nerves 10/30/12   Campbell Riches, NP  aspirin 81 MG tablet Take 81 mg by mouth daily.      Historical Provider, MD  benazepril (LOTENSIN) 40 MG tablet TAKE 1 TABLET BY MOUTH DAILY 11/03/12   Babs Sciara, MD  citalopram (CELEXA) 20 MG tablet 1/2 tab at HS x 6 days then one po q HS 12/01/12   Babs Sciara, MD  furosemide (LASIX) 20 MG tablet Take 20 mg by mouth. Take 2 tabs in am daily    Historical Provider, MD  HYDROcodone-acetaminophen (NORCO) 10-325 MG per tablet Take 1 tablet by mouth 2 (two) times daily as needed (pain). 04/01/13   Babs Sciara, MD  metoprolol (TOPROL-XL) 50 MG 24 hr tablet Take 50 mg by mouth daily.      Historical Provider, MD  nitroGLYCERIN (NITROSTAT) 0.4 MG SL tablet Place 1 tablet (0.4 mg total) under the tongue every 5 (five) minutes as needed for chest  pain. 08/01/12 08/01/13  Babs Sciara, MD  polyethylene glycol-electrolytes (TRILYTE) 420 G solution Take 4,000 mLs by mouth as directed. 03/18/13   Corbin Ade, MD  potassium chloride SA (K-DUR,KLOR-CON) 20 MEQ tablet Take 20 mEq by mouth daily.      Historical Provider, MD  pravastatin (PRAVACHOL) 40 MG tablet TAKE 1 TABLET BY MOUTH EVERY DAY AT BEDTIME 02/09/13   Babs Sciara, MD  tiotropium (SPIRIVA) 18 MCG inhalation capsule Place 18 mcg into inhaler and inhale as needed.     Historical Provider, MD   Physical Exam: Filed Vitals:   04/05/13 1559 04/05/13 1616  BP:  120/64  Pulse:  95  Resp:  18  Height:  5\' 7"  (1.702 m)  Weight:  72.576 kg (160 lb)  SpO2: 93% 100%     General:  Awake, in nad  Eyes: PERRL B  ENT: membranes moist, dentition fair  Neck: trachea midline, neck  supple  Cardiovascular: regular, s1, s2  Respiratory: increased resp effort, decreased BS throughout, no audible wheezing (but has very soft breath sounds)  Abdomen: soft, nondistended  Skin: normal skin turgor, no abnormal skin lesions seen  Musculoskeletal: perfused, no clubbing  Psychiatric: mood/affect normal // no auditory/visual hallucinations  Neurologic: cn2-12 grossly intact, strength/sensation inatact  Labs on Admission:  Basic Metabolic Panel: No results found for this basename: NA, K, CL, CO2, GLUCOSE, BUN, CREATININE, CALCIUM, MG, PHOS,  in the last 168 hours Liver Function Tests: No results found for this basename: AST, ALT, ALKPHOS, BILITOT, PROT, ALBUMIN,  in the last 168 hours No results found for this basename: LIPASE, AMYLASE,  in the last 168 hours No results found for this basename: AMMONIA,  in the last 168 hours CBC:  Recent Labs Lab 04/05/13 1546  WBC 15.2*  NEUTROABS 12.6*  HGB 16.0  HCT 49.3  MCV 93.9  PLT 120*   Cardiac Enzymes: No results found for this basename: CKTOTAL, CKMB, CKMBINDEX, TROPONINI,  in the last 168 hours  BNP (last 3 results) No results found for this basename: PROBNP,  in the last 8760 hours CBG: No results found for this basename: GLUCAP,  in the last 168 hours  Radiological Exams on Admission: Dg Chest Port 1 View  04/05/2013   CLINICAL DATA:  Emphysema. coronary artery disease. Cough and shortness of breath.  EXAM: PORTABLE CHEST - 1 VIEW  COMPARISON:  02/24/2013 chest CT  FINDINGS: Severe emphysema. Prior CABG. Mildly enlarged cardiopericardial silhouette.  Linear subsegmental atelectasis or scarring at the right lateral lung base. Central airway thickening.  IMPRESSION: 1. Severe emphysema. 2. Mildly enlarged cardiopericardial silhouette. 3. Airway thickening is present, suggesting bronchitis or reactive airways disease. 4. Subsegmental atelectasis in the lateral basal segment right lower lobe.   Electronically Signed    By: Herbie Baltimore M.D.   On: 04/05/2013 16:07   Assessment/Plan Principal Problem:   COPD (chronic obstructive pulmonary disease) Active Problems:   TOBACCO ABUSE   HYPERTENSION   Hyperlipemia   CAD (coronary artery disease)   1. COPD exacerbation 1. Cont with q4h Duonebs with q2hrs prn 2. Cont solumedrol 60mg  IV q6hrs with plans to eventually wean 3. Admit to med/tele 2. HTN 1. BP stable, currently controlled 2. Cont current regimen 3. CAD 1. Asymptomatic 2. Cont home meds 4. HLD 1. Cont home meds 5. Hx tobacco abuse 1. Cessation done at bedside for  Code Status: Full (must indicate code status--if unknown or must be presumed, indicate so) Family Communication: Pt  in room (indicate person spoken with, if applicable, with phone number if by telephone) Disposition Plan: pending (indicate anticipated LOS)  Time spent:  Riah Kehoe, Scheryl Marten [facility] Pager [phone]  If 7PM-7AM, please contact night-coverage [email or web address] Password [email or web address] 04/05/2013, 4:19 PM

## 2013-04-05 NOTE — Progress Notes (Signed)
Notified Dr. Jena Gauss of order for patient to be npo for EGD and TCS.  Dr. Jena Gauss said at this time, no plans for EGD or TCS.   Dr. Jena Gauss said he was come in am (04-06-13) and see patient and make decision at that time.

## 2013-04-05 NOTE — ED Provider Notes (Signed)
CSN: [id number]     Arrival date & time 04/05/13  1530 History   First MD Initiated Contact with Patient 04/05/13 1535     Chief Complaint  Patient presents with  . Shortness of Breath    HPI Pt was seen at 1535.  Per pt, c/o gradual onset and worsening of persistent cough, wheezing and SOB for the past 2 to 3 days.  Describes his symptoms as "my COPD is acting up."  Has been using home MDI and nebs without relief. Has been associated with acute flair of his chronic moist cough with "white" sputum and diaphoresis. Denies CP/palpitations, no back pain, no abd pain, no N/V/D, no fevers, no rash.     Past Medical History  Diagnosis Date  . AAA (abdominal aortic aneurysm)   . Cough   . Shortness of breath   . COPD (chronic obstructive pulmonary disease)   . Hyperlipidemia   . CAD (coronary artery disease)   . Hypertension   . Ulcer     gastric  . Aneurysm   . Prediabetes    Past Surgical History  Procedure Laterality Date  . Lipoma excision      right lower back  . Partial gastrectomy      approx 1990  . Cardiac surgery     Family History  Problem Relation Age of Onset  . Hypertension Brother   . Heart disease Brother   . Cancer Mother   . Cancer Father     prostate  . COPD Sister   . Colon cancer Neg Hx    History  Substance Use Topics  . Smoking status: Current Every Day Smoker -- 0.50 packs/day for 55 years    Types: Cigarettes  . Smokeless tobacco: Never Used  . Alcohol Use: No    Review of Systems ROS: Statement: All systems negative except as marked or noted in the HPI; Constitutional: Negative for fever and chills. ; ; Eyes: Negative for eye pain, redness and discharge. ; ; ENMT: Negative for ear pain, hoarseness, nasal congestion, sinus pressure and sore throat. ; ; Cardiovascular: Negative for chest pain, palpitations, and peripheral edema. ; ; Respiratory: +SOB, cough, wheezing. Negative for stridor. ; ; Gastrointestinal: Negative for nausea, vomiting,  diarrhea, abdominal pain, blood in stool, hematemesis, jaundice and rectal bleeding. . ; ; Genitourinary: Negative for dysuria, flank pain and hematuria. ; ; Musculoskeletal: Negative for back pain and neck pain. Negative for swelling and trauma.; ; Skin: +diaphoresis. Negative for pruritus, rash, abrasions, blisters, bruising and skin lesion.; ; Neuro: Negative for headache, lightheadedness and neck stiffness. Negative for weakness, altered level of consciousness , altered mental status, extremity weakness, paresthesias, involuntary movement, seizure and syncope.      Allergies  Review of patient's allergies indicates no known allergies.  Home Medications   Current Outpatient Rx  Name  Route  Sig  Dispense  Refill  . albuterol (PROVENTIL HFA;VENTOLIN HFA) 108 (90 BASE) MCG/ACT inhaler   Inhalation   Inhale 2 puffs into the lungs every 6 (six) hours as needed for wheezing.         Marland Kitchen albuterol (PROVENTIL) (2.5 MG/3ML) 0.083% nebulizer solution      USE 1 VIAL VIA NEBULIZER EVERY 4 HOURS AS NEEDED   150 mL   11   . ALPRAZolam (XANAX) 1 MG tablet      1/2 in am; 1/2 in the afternoon and one po q Hs prn nerves   60 tablet   0   .  aspirin 81 MG tablet   Oral   Take 81 mg by mouth daily.           . benazepril (LOTENSIN) 40 MG tablet      TAKE 1 TABLET BY MOUTH DAILY   90 tablet   1   . citalopram (CELEXA) 20 MG tablet      1/2 tab at HS x 6 days then one po q HS   90 tablet   2   . furosemide (LASIX) 20 MG tablet   Oral   Take 20 mg by mouth. Take 2 tabs in am daily         . HYDROcodone-acetaminophen (NORCO) 10-325 MG per tablet   Oral   Take 1 tablet by mouth 2 (two) times daily as needed (pain).   60 tablet   0   . metoprolol (TOPROL-XL) 50 MG 24 hr tablet   Oral   Take 50 mg by mouth daily.           . nitroGLYCERIN (NITROSTAT) 0.4 MG SL tablet   Sublingual   Place 1 tablet (0.4 mg total) under the tongue every 5 (five) minutes as needed for chest  pain.   100 tablet   6   . polyethylene glycol-electrolytes (TRILYTE) 420 G solution   Oral   Take 4,000 mLs by mouth as directed.   4000 mL   0   . potassium chloride SA (K-DUR,KLOR-CON) 20 MEQ tablet   Oral   Take 20 mEq by mouth daily.           . pravastatin (PRAVACHOL) 40 MG tablet      TAKE 1 TABLET BY MOUTH EVERY DAY AT BEDTIME   30 tablet   3     Dispense as written.   . tiotropium (SPIRIVA) 18 MCG inhalation capsule   Inhalation   Place 18 mcg into inhaler and inhale as needed.            Filed Vitals:   04/05/13 1559 04/05/13 1616  BP:  120/64  Pulse:  95  Resp:  18  Height:  5\' 7"  (1.702 m)  Weight:  160 lb (72.576 kg)  SpO2: 93% 100%    Physical Exam 1540: Physical examination:  Nursing notes reviewed; Vital signs and O2 SAT reviewed;  Constitutional: Well developed, Well nourished, Uncomfortable appearing.; Head:  Normocephalic, atraumatic; Eyes: EOMI, PERRL, No scleral icterus; ENMT: Mouth and pharynx normal, Mucous membranes dry; Neck: Supple, Full range of motion, No lymphadenopathy; Cardiovascular: Regular rate and rhythm, No gallop; Respiratory: Breath sounds diminished & equal bilaterally, faint insp/exp wheezes bilat. Speaking in few words, sitting upright on side of stretcher, tachypneic, +access mm use, +retrax.; Chest: Nontender, Movement normal; Abdomen: Soft, Nontender, Nondistended, Normal bowel sounds; Genitourinary: No CVA tenderness; Extremities: Pulses normal, No tenderness, No edema, No calf edema or asymmetry.; Neuro: AA&Ox3, Major CN grossly intact.  Speech clear. No gross focal motor or sensory deficits in extremities.; Skin: Color normal, Warm, Dry.   ED Course  Procedures   1545:  On arrival, pt's O2 Sats 75% R/A, tachypneic, sitting upright on stretcher, speaking in few words. Will start IV solumedrol and continuous neb.  1630:  Hour long neb in progress, Sats 100% on neb. Appears less tachypneic, now able to sit back on  stretcher.  Dx and testing d/w pt and family.  Questions answered.  Verb understanding, agreeable to admit. T/C to [facility]. Rhona Leavens, case discussed, including:  HPI, pertinent PM/SHx, VS/PE, dx  testing, ED course and treatment:  Agreeable to admit, requests to write temporary orders, obtain tele bed to team 2.   EKG Interpretation    Date/Time:  Sunday April 05 2013 16:00:34 EST Ventricular Rate:  91 PR Interval:  214 QRS Duration: 150 QT Interval:  418 QTC Calculation: 514 R Axis:   180 Text Interpretation:  Sinus rhythm with 1st degree A-V block Non-specific intra-ventricular conduction block Lateral infarct , age undetermined Abnormal ECG No previous ECGs available Confirmed by [facility]  MD, Nicholos Johns [phone]) on 04/06/2013 12:36:50 AM            MDM  MDM Reviewed: previous chart, nursing note and vitals Reviewed previous: labs and ECG Interpretation: labs, ECG and x-ray Total time providing critical care: 30-74 minutes. This excludes time spent performing separately reportable procedures and services. Consults: admitting MD     CRITICAL CARE Performed by: Laray Anger Total critical care time: 40 Critical care time was exclusive of separately billable procedures and treating other patients. Critical care was necessary to treat or prevent imminent or life-threatening deterioration. Critical care was time spent personally by me on the following activities: development of treatment plan with patient and/or surrogate as well as nursing, discussions with consultants, evaluation of patient's response to treatment, examination of patient, obtaining history from patient or surrogate, ordering and performing treatments and interventions, ordering and review of laboratory studies, ordering and review of radiographic studies, pulse oximetry and re-evaluation of patient's condition.   Results for orders placed during the hospital encounter of 04/05/13  CBC WITH DIFFERENTIAL       Result Value Range   WBC 15.2 (*) 4.0 - 10.5 K/uL   RBC 5.25  4.22 - 5.81 MIL/uL   Hemoglobin 16.0  13.0 - 17.0 g/dL   HCT 56.2  13.0 - 86.5 %   MCV 93.9  78.0 - 100.0 fL   MCH 30.5  26.0 - 34.0 pg   MCHC 32.5  30.0 - 36.0 g/dL   RDW 78.4  69.6 - 29.5 %   Platelets 120 (*) 150 - 400 K/uL   Neutrophils Relative % 83 (*) 43 - 77 %   Neutro Abs 12.6 (*) 1.7 - 7.7 K/uL   Lymphocytes Relative 6 (*) 12 - 46 %   Lymphs Abs 0.9  0.7 - 4.0 K/uL   Monocytes Relative 11  3 - 12 %   Monocytes Absolute 1.7 (*) 0.1 - 1.0 K/uL   Eosinophils Relative 0  0 - 5 %   Eosinophils Absolute 0.0  0.0 - 0.7 K/uL   Basophils Relative 0  0 - 1 %   Basophils Absolute 0.0  0.0 - 0.1 K/uL  BASIC METABOLIC PANEL      Result Value Range   Sodium 134 (*) 135 - 145 mEq/L   Potassium 4.3  3.5 - 5.1 mEq/L   Chloride 93 (*) 96 - 112 mEq/L   CO2 30  19 - 32 mEq/L   Glucose, Bld 122 (*) 70 - 99 mg/dL   BUN 12  6 - 23 mg/dL   Creatinine, Ser 2.84  0.50 - 1.35 mg/dL   Calcium 13.2  8.4 - 44.0 mg/dL   GFR calc non Af Amer >90  >90 mL/min   GFR calc Af Amer >90  >90 mL/min  TROPONIN I      Result Value Range   Troponin I <0.30  <0.30 ng/mL   Dg Chest Port 1 View 04/05/2013   CLINICAL DATA:  Emphysema. coronary  artery disease. Cough and shortness of breath.  EXAM: PORTABLE CHEST - 1 VIEW  COMPARISON:  02/24/2013 chest CT  FINDINGS: Severe emphysema. Prior CABG. Mildly enlarged cardiopericardial silhouette.  Linear subsegmental atelectasis or scarring at the right lateral lung base. Central airway thickening.  IMPRESSION: 1. Severe emphysema. 2. Mildly enlarged cardiopericardial silhouette. 3. Airway thickening is present, suggesting bronchitis or reactive airways disease. 4. Subsegmental atelectasis in the lateral basal segment right lower lobe.   Electronically Signed   By: Herbie Baltimore M.D.   On: 04/05/2013 16:07      Laray Anger, DO 04/06/13 [phone]

## 2013-04-06 DIAGNOSIS — J441 Chronic obstructive pulmonary disease with (acute) exacerbation: Secondary | ICD-10-CM | POA: Diagnosis not present

## 2013-04-06 DIAGNOSIS — F411 Generalized anxiety disorder: Secondary | ICD-10-CM

## 2013-04-06 DIAGNOSIS — J449 Chronic obstructive pulmonary disease, unspecified: Secondary | ICD-10-CM | POA: Diagnosis not present

## 2013-04-06 LAB — CBC
HCT: 43.9 % (ref 39.0–52.0)
Hemoglobin: 14.2 g/dL (ref 13.0–17.0)
MCH: 30.4 pg (ref 26.0–34.0)
MCHC: 32.3 g/dL (ref 30.0–36.0)
MCV: 94 fL (ref 78.0–100.0)
Platelets: 116 10*3/uL — ABNORMAL LOW (ref 150–400)
RBC: 4.67 MIL/uL (ref 4.22–5.81)
WBC: 9 10*3/uL (ref 4.0–10.5)

## 2013-04-06 LAB — COMPREHENSIVE METABOLIC PANEL
ALT: 13 U/L (ref 0–53)
AST: 18 U/L (ref 0–37)
Albumin: 3.3 g/dL — ABNORMAL LOW (ref 3.5–5.2)
Chloride: 100 mEq/L (ref 96–112)
Creatinine, Ser: 0.49 mg/dL — ABNORMAL LOW (ref 0.50–1.35)
Total Bilirubin: 0.6 mg/dL (ref 0.3–1.2)
Total Protein: 6.8 g/dL (ref 6.0–8.3)

## 2013-04-06 MED ORDER — METHYLPREDNISOLONE SODIUM SUCC 125 MG IJ SOLR
60.0000 mg | Freq: Two times a day (BID) | INTRAMUSCULAR | Status: DC
  Administered 2013-04-06 – 2013-04-08 (×5): 60 mg via INTRAVENOUS
  Filled 2013-04-06 (×5): qty 2

## 2013-04-06 NOTE — Progress Notes (Signed)
Patient ambulated halls this afternoon.  On 3L stats dropped to 83%.  Patient seemed to tolerate ambulating about 70 feet, though.  Pt stated SOB was minimal.

## 2013-04-06 NOTE — Progress Notes (Signed)
INITIAL NUTRITION ASSESSMENT  DOCUMENTATION CODES Per approved criteria  -Not Applicable   INTERVENTION: Heart Healthy diet  RD will follow for nutrition care  NUTRITION DIAGNOSIS: Increased protein needs related to acute on chronic disease as evidenced by COPD with acute exacerbation and current guidelines.   Goal: Pt to meet >/= 90% of their estimated nutrition needs   Monitor:  Po intake, labs and wt trends  Reason for Assessment: Malnutrition Screen Score = 2  72 y.o. male  Admitting Dx: COPD (chronic obstructive pulmonary disease)  ASSESSMENT: Pt from home. Hx of dysphagia, wt loss (not clinically significant) and early satiety. Weight of 186# (02/02/11) appears to be outlier based on pt UBW range. Appetite good currently and tolerating Heart Healthy diet with 100% of meal consumed. Feeds himself. Smoker. At risk for malnutrition related to chronic disease.   Patient Active Problem List   Diagnosis Date Noted  . COPD with acute exacerbation 04/05/2013  . COPD exacerbation 04/05/2013  . Loss of weight 03/18/2013  . Early satiety 03/18/2013  . Dysphagia, unspecified(787.20) 03/18/2013  . COPD (chronic obstructive pulmonary disease) 10/31/2012  . Hyperlipemia 10/31/2012  . CAD (coronary artery disease) 10/31/2012  . Anxiety 10/31/2012  . Abdominal aneurysm without mention of rupture 02/02/2011  . TOBACCO ABUSE 09/20/2009  . PEPTIC ULCER DISEASE 09/20/2009  . HYPERTENSION 09/19/2009  . BRONCHITIS, CHRONIC 09/19/2009    Height: Ht Readings from Last 1 Encounters:  04/05/13 5\' 7"  (1.702 m)    Weight: Wt Readings from Last 1 Encounters:  04/05/13 160 lb (72.576 kg)    Ideal Body Weight: 148# (67 kg)  % Ideal Body Weight: 108%  Wt Readings from Last 10 Encounters:  04/05/13 160 lb (72.576 kg)  03/18/13 156 lb 12.8 oz (71.124 kg)  03/12/13 156 lb 9.6 oz (71.033 kg)  02/17/13 159 lb (72.122 kg)  12/05/12 152 lb (68.947 kg)  10/30/12 160 lb (72.576 kg)   08/13/12 166 lb (75.297 kg)  02/02/11 186 lb (84.369 kg)  12/29/10 170 lb 8 oz (77.338 kg)  06/06/10 175 lb (79.379 kg)    Usual Body Weight: 155-160#  % Usual Body Weight: 100%  BMI:  Body mass index is 25.05 kg/(m^2).overweight  Estimated Nutritional Needs: Kcal: 1844-2160 Protein: 108 gr  Fluid: 1.9-2.2 liters daily  Skin: intact  Diet Order: Cardiac (po 100% breakfast today)  EDUCATION NEEDS: -No education needs identified at this time   Intake/Output Summary (Last 24 hours) at 04/06/13 1130 Last data filed at 04/06/13 0900  Gross per 24 hour  Intake    240 ml  Output      0 ml  Net    240 ml    Last BM: 04/04/13  Labs:   Recent Labs Lab 04/05/13 1546 04/06/13 0551  NA 134* 138  K 4.3 4.0  CL 93* 100  CO2 30 31  BUN 12 11  CREATININE 0.54 0.49*  CALCIUM 10.5 9.8  GLUCOSE 122* 194*    CBG (last 3)  No results found for this basename: GLUCAP,  in the last 72 hours  Scheduled Meds: . ipratropium  0.5 mg Nebulization Q4H   And  . albuterol  2.5 mg Nebulization Q4H  . heparin  5,000 Units Subcutaneous Q8H  . methylPREDNISolone (SOLU-MEDROL) injection  60 mg Intravenous Q12H  . nicotine  21 mg Transdermal Daily    Continuous Infusions: . sodium chloride      Past Medical History  Diagnosis Date  . AAA (abdominal aortic aneurysm)   .  Cough   . Shortness of breath   . COPD (chronic obstructive pulmonary disease)   . Hyperlipidemia   . CAD (coronary artery disease)   . Hypertension   . Ulcer     gastric  . Aneurysm   . Prediabetes     Past Surgical History  Procedure Laterality Date  . Lipoma excision      right lower back  . Partial gastrectomy      approx 1990  . Cardiac surgery      Royann Shivers MS,RD,CSG,LDN Office: #[phone] Pager: [phone]

## 2013-04-06 NOTE — Progress Notes (Signed)
[facility] PROGRESS NOTE  Tommy Turner:[id number] DOB: October 29, 1940 DOA: 04/05/2013 PCP: Lilyan Punt, MD  Assessment/Plan: 1. COPD exacerbation  1.  Reports some improvement this am. Reports close to baseline but requiring 4L [place] to maintain sats 91-94% range. Cont with q4h Duonebs with q2hrs prn 2. Cont solumedrol 60mg  IV but will wean.  2. HTN  1. BP stable, currently controlled 2. Cont current regimen. Pt take lasix and BB at home. These are currently on hold. monitor 3. CAD  1. Asymptomatic 2. Cont home meds 4. HLD  1. Cont home meds 5. Hx tobacco abuse  1. Cessation done at bedside for   Code Status: full Family Communication: none available Disposition Plan: home when ready   Consultants:  none  Procedures:  none  Antibiotics:  none  HPI/Subjective: Sitting up in bed eating. Denies pain/discomfort  Objective: Filed Vitals:   04/06/13 0734  BP:   Pulse: 79  Temp:   Resp: 16   No intake or output data in the 24 hours ending 04/06/13 0934 Filed Weights   04/05/13 1616  Weight: 72.576 kg (160 lb)    Exam:   General:  Thin somewhat frail appearing NAD  Cardiovascular: RRR No MGR No LE edema  Respiratory: mild increased work of breathing with conversation. BS distant. Little wheeze. Only fair air flow.  Abdomen: soft +BS non tender to palpation  Musculoskeletal: no clubbing no cyanosis   Data Reviewed: Basic Metabolic Panel:  Recent Labs Lab 04/05/13 1546 04/06/13 0551  NA 134* 138  K 4.3 4.0  CL 93* 100  CO2 30 31  GLUCOSE 122* 194*  BUN 12 11  CREATININE 0.54 0.49*  CALCIUM 10.5 9.8   Liver Function Tests:  Recent Labs Lab 04/06/13 0551  AST 18  ALT 13  ALKPHOS 68  BILITOT 0.6  PROT 6.8  ALBUMIN 3.3*   No results found for this basename: LIPASE, AMYLASE,  in the last 168 hours No results found for this basename: AMMONIA,  in the last 168 hours CBC:  Recent Labs Lab 04/05/13 1546 04/06/13 0551   WBC 15.2* 9.0  NEUTROABS 12.6*  --   HGB 16.0 14.2  HCT 49.3 43.9  MCV 93.9 94.0  PLT 120* 116*   Cardiac Enzymes:  Recent Labs Lab 04/05/13 1546  TROPONINI <0.30   BNP (last 3 results) No results found for this basename: PROBNP,  in the last 8760 hours CBG: No results found for this basename: GLUCAP,  in the last 168 hours  No results found for this or any previous visit (from the past 240 hour(s)).   Studies: Dg Chest Port 1 View  04/05/2013   CLINICAL DATA:  Emphysema. coronary artery disease. Cough and shortness of breath.  EXAM: PORTABLE CHEST - 1 VIEW  COMPARISON:  02/24/2013 chest CT  FINDINGS: Severe emphysema. Prior CABG. Mildly enlarged cardiopericardial silhouette.  Linear subsegmental atelectasis or scarring at the right lateral lung base. Central airway thickening.  IMPRESSION: 1. Severe emphysema. 2. Mildly enlarged cardiopericardial silhouette. 3. Airway thickening is present, suggesting bronchitis or reactive airways disease. 4. Subsegmental atelectasis in the lateral basal segment right lower lobe.   Electronically Signed   By: Herbie Baltimore M.D.   On: 04/05/2013 16:07    Scheduled Meds: . sodium chloride   Intravenous STAT  . ipratropium  0.5 mg Nebulization Q4H   And  . albuterol  2.5 mg Nebulization Q4H  . heparin  5,000 Units Subcutaneous Q8H  . methylPREDNISolone (SOLU-MEDROL)  injection  60 mg Intravenous Q6H  . nicotine  21 mg Transdermal Daily   Continuous Infusions: . sodium chloride 75 mL/hr at 04/05/13 1600  . sodium chloride      Principal Problem:   COPD (chronic obstructive pulmonary disease) Active Problems:   TOBACCO ABUSE   HYPERTENSION   Hyperlipemia   CAD (coronary artery disease)   COPD with acute exacerbation   COPD exacerbation    Time spent: 40 minutes   [facility] M  [facility] Pager [phone]. If 7PM-7AM, please contact night-coverage at [email or web address], password [facility] 04/06/2013, 9:34 AM  LOS: 1 day

## 2013-04-06 NOTE — Progress Notes (Signed)
Utilization Review Complete  

## 2013-04-06 NOTE — Progress Notes (Signed)
Pt seen and examined. Agree with assessment and plan per Ms. Black, NP. Pt with continued wheezing and decreased BS. Still appears to have increased work of breathing. Agree with continued neb tx and scheduled steroids. Will follow.

## 2013-04-07 ENCOUNTER — Encounter (HOSPITAL_COMMUNITY): Payer: Self-pay | Admitting: Pharmacy Technician

## 2013-04-07 DIAGNOSIS — J441 Chronic obstructive pulmonary disease with (acute) exacerbation: Secondary | ICD-10-CM | POA: Diagnosis not present

## 2013-04-07 DIAGNOSIS — J449 Chronic obstructive pulmonary disease, unspecified: Secondary | ICD-10-CM | POA: Diagnosis not present

## 2013-04-07 MED ORDER — TIOTROPIUM BROMIDE MONOHYDRATE 18 MCG IN CAPS
18.0000 ug | ORAL_CAPSULE | Freq: Every day | RESPIRATORY_TRACT | Status: DC
  Filled 2013-04-07: qty 5

## 2013-04-07 NOTE — Progress Notes (Signed)
Pt seen and examined. Agree with note by Toya Smothers, NP. Pt presents with COPD exacerbation currently IV steroid and neb dependent. Pt still with markedly decreased BS throughout. Agree with wean as tolerated. Will follow.

## 2013-04-07 NOTE — Progress Notes (Signed)
[facility] PROGRESS NOTE  Tommy Turner JYN:[id number] DOB: 1941/03/20 DOA: 04/05/2013 PCP: Lilyan Punt, MD  Assessment/Plan: 1. COPD exacerbation  1. Continues with slow improvement this am. Oxygen weaned to 3.5L [place] to maintain sats 91-94% range. Cont with q4h Duonebs with q2hrs prn 2. Cont solumedrol 60mg  IV  Every 12 hours. Will plan to transition to po tomorrow when hopefully he can be discharged  2. HTN  1. BP remains stable,  2. Pt take lasix and BB at home. These are currently on hold. Will  Monitor and resume as indicated 3. CAD  1. Asymptomatic 2. Cont home meds 4. HLD  1. Cont home meds 5. Hx tobacco abuse  1. Cessation done at bedside for     Code Status: full Family Communication: family at bedside Disposition Plan: home when ready hopefully tomorrow   Consultants:  none  Procedures:  none  Antibiotics:  none  HPI/Subjective: Sitting up in bed. Denies pain/discomfort.  Objective: Filed Vitals:   04/06/13 1419  BP: 118/59  Pulse: 88  Temp: 97.6 F (36.4 C)  Resp: 16   No intake or output data in the 24 hours ending 04/07/13 1055 Filed Weights   04/05/13 1616  Weight: 72.576 kg (160 lb)    Exam:   General:  Thin frail NAD  Cardiovascular: RRR No MGR No LE edema  Respiratory: mild increased work of breathing with conversation. BS with improved air flow and diffuse wheeze faint rhonchi.   Abdomen: soft +BS non_tender to palpation  Musculoskeletal:  No clubbing no cyanosis  Data Reviewed: Basic Metabolic Panel:  Recent Labs Lab 04/05/13 1546 04/06/13 0551  NA 134* 138  K 4.3 4.0  CL 93* 100  CO2 30 31  GLUCOSE 122* 194*  BUN 12 11  CREATININE 0.54 0.49*  CALCIUM 10.5 9.8   Liver Function Tests:  Recent Labs Lab 04/06/13 0551  AST 18  ALT 13  ALKPHOS 68  BILITOT 0.6  PROT 6.8  ALBUMIN 3.3*   No results found for this basename: LIPASE, AMYLASE,  in the last 168 hours No results found for this  basename: AMMONIA,  in the last 168 hours CBC:  Recent Labs Lab 04/05/13 1546 04/06/13 0551  WBC 15.2* 9.0  NEUTROABS 12.6*  --   HGB 16.0 14.2  HCT 49.3 43.9  MCV 93.9 94.0  PLT 120* 116*   Cardiac Enzymes:  Recent Labs Lab 04/05/13 1546  TROPONINI <0.30   BNP (last 3 results) No results found for this basename: PROBNP,  in the last 8760 hours CBG: No results found for this basename: GLUCAP,  in the last 168 hours  No results found for this or any previous visit (from the past 240 hour(s)).   Studies: Dg Chest Port 1 View  04/05/2013   CLINICAL DATA:  Emphysema. coronary artery disease. Cough and shortness of breath.  EXAM: PORTABLE CHEST - 1 VIEW  COMPARISON:  02/24/2013 chest CT  FINDINGS: Severe emphysema. Prior CABG. Mildly enlarged cardiopericardial silhouette.  Linear subsegmental atelectasis or scarring at the right lateral lung base. Central airway thickening.  IMPRESSION: 1. Severe emphysema. 2. Mildly enlarged cardiopericardial silhouette. 3. Airway thickening is present, suggesting bronchitis or reactive airways disease. 4. Subsegmental atelectasis in the lateral basal segment right lower lobe.   Electronically Signed   By: Herbie Baltimore M.D.   On: 04/05/2013 16:07    Scheduled Meds: . ipratropium  0.5 mg Nebulization Q4H   And  . albuterol  2.5 mg Nebulization  Q4H  . heparin  5,000 Units Subcutaneous Q8H  . methylPREDNISolone (SOLU-MEDROL) injection  60 mg Intravenous Q12H  . nicotine  21 mg Transdermal Daily  . tiotropium  18 mcg Inhalation Daily  . [DISCONTINUED] sodium chloride   Intravenous STAT   Continuous Infusions: . sodium chloride      Principal Problem:   COPD (chronic obstructive pulmonary disease) Active Problems:   TOBACCO ABUSE   HYPERTENSION   Hyperlipemia   CAD (coronary artery disease)   COPD with acute exacerbation   COPD exacerbation    Time spent: 30 minutes    [facility] M  [facility] Pager [phone]. If  7PM-7AM, please contact night-coverage at [email or web address], password [facility] 04/07/2013, 10:55 AM  LOS: 2 days

## 2013-04-07 NOTE — Progress Notes (Signed)
Pt ambulated in hallway with standby assist from NT. Pt's O2 saturation on 2L dropped to mid 80's.  Pt ambulated back to room, continued on O2 via 2L while sitting on bed, O2 saturation increased to 93% and above. Pt tolerated ambulation fairly.

## 2013-04-08 DIAGNOSIS — I10 Essential (primary) hypertension: Secondary | ICD-10-CM | POA: Diagnosis not present

## 2013-04-08 DIAGNOSIS — F17200 Nicotine dependence, unspecified, uncomplicated: Secondary | ICD-10-CM | POA: Diagnosis not present

## 2013-04-08 DIAGNOSIS — J449 Chronic obstructive pulmonary disease, unspecified: Secondary | ICD-10-CM | POA: Diagnosis not present

## 2013-04-08 DIAGNOSIS — J42 Unspecified chronic bronchitis: Secondary | ICD-10-CM

## 2013-04-08 DIAGNOSIS — J441 Chronic obstructive pulmonary disease with (acute) exacerbation: Secondary | ICD-10-CM | POA: Diagnosis not present

## 2013-04-08 MED ORDER — BUDESONIDE-FORMOTEROL FUMARATE 160-4.5 MCG/ACT IN AERO
INHALATION_SPRAY | RESPIRATORY_TRACT | Status: AC
  Filled 2013-04-08: qty 6

## 2013-04-08 MED ORDER — GUAIFENESIN ER 600 MG PO TB12
1200.0000 mg | ORAL_TABLET | Freq: Two times a day (BID) | ORAL | Status: DC
  Administered 2013-04-08 – 2013-04-12 (×8): 1200 mg via ORAL
  Filled 2013-04-08 (×9): qty 2

## 2013-04-08 MED ORDER — LEVOFLOXACIN 750 MG PO TABS
750.0000 mg | ORAL_TABLET | Freq: Every day | ORAL | Status: DC
  Administered 2013-04-08 – 2013-04-12 (×5): 750 mg via ORAL
  Filled 2013-04-08 (×5): qty 1

## 2013-04-08 MED ORDER — METHYLPREDNISOLONE SODIUM SUCC 125 MG IJ SOLR
60.0000 mg | Freq: Four times a day (QID) | INTRAMUSCULAR | Status: DC
  Administered 2013-04-08 – 2013-04-11 (×10): 60 mg via INTRAVENOUS
  Filled 2013-04-08 (×10): qty 2

## 2013-04-08 MED ORDER — PREDNISONE 10 MG PO TABS: ORAL_TABLET | ORAL | Status: DC

## 2013-04-08 MED ORDER — NICOTINE 21 MG/24HR TD PT24: 21.0000 mg | MEDICATED_PATCH | Freq: Every day | TRANSDERMAL | Status: DC

## 2013-04-08 MED ORDER — BUDESONIDE-FORMOTEROL FUMARATE 160-4.5 MCG/ACT IN AERO
2.0000 | INHALATION_SPRAY | Freq: Two times a day (BID) | RESPIRATORY_TRACT | Status: DC
  Administered 2013-04-08 – 2013-04-12 (×8): 2 via RESPIRATORY_TRACT
  Filled 2013-04-08: qty 6

## 2013-04-08 NOTE — Care Management Note (Addendum)
    Page 1 of 1   04/08/2013     1:05:20 PM   CARE MANAGEMENT NOTE 04/08/2013  Patient:  Tommy Turner, Tommy Turner   Account Number:  [id number]  Date Initiated:  04/08/2013  Documentation initiated by:  Rosemary Holms  Subjective/Objective Assessment:   Pt admitted form home where he lives with his wife. Need Home O2 and chose [facility]. Also agreed to speak with [facility]. Declines HH     Action/Plan:   Anticipated DC Date:  04/08/2013   Anticipated DC Plan:  HOME/SELF CARE      DC Planning Services  CM consult      Choice offered to / List presented to:     DME arranged  [facility]      DME agency  [facility]        Status of service:  Completed, signed off Medicare Important Message given?  YES (If response is "NO", the following Medicare IM given date fields will be blank) Date Medicare IM given:  04/08/2013 Date Additional Medicare IM given:    Discharge Disposition:  HOME/SELF CARE  Per UR Regulation:    If discussed at Long Length of Stay Meetings, dates discussed:    Comments:  04/08/13 Rosemary Holms RN BSN CM

## 2013-04-08 NOTE — Discharge Summary (Signed)
Patient seen and examined.  Above note reviewed.  Patient has been admitted with progressive shortness of breath which is felt to be due to COPD exacerbation.  He has been on steroids and nebulizer treatments.  His progress has been very slow and he is still short of breath on supplemental oxygen. Patient is not chronically on oxygen, but appears to have advanced COPD.  Agree with plans for pulmonology input.  Continue current treatments for now and add levaquin.  He recently had a Chest CT on 02/26/13 for cough, weight loss and shortness of breath.  No underlying malignancy was noted.  He did have pulmonary hypertension, but this could be secondary to his COPD.  Patient is not ready for discharge today and just may need more time in the hospital, but would appreciate pulmonology input to see if there is anything further we can do to optimize him.  Tommy Turner

## 2013-04-08 NOTE — Progress Notes (Signed)
Addendum:   Discharge postponed.  Respiratory status too slow to improve. Pt not on oxygen at home at baseline and still requiring oxygen at 3L.  Will request pulmonology consult and will start levaquin.     Gwenyth Bender, NP

## 2013-04-08 NOTE — Plan of Care (Addendum)
Pt O2 lowered to 2L and recheck in 30 minutes (SPO2 was 79 on RA).  Pt O2 turned back up to 4L. Pt will need O2 at home for rest and ambulation.

## 2013-04-08 NOTE — Progress Notes (Signed)
Patient seen and examined, please see discharge summary for more details from earlier today.    Discharge home today has been cancelled due to progressive dyspnea.  Tommy Turner

## 2013-04-08 NOTE — Discharge Summary (Signed)
Physician Discharge Summary  Tommy Turner YNW:[id number] DOB: 20-Jul-1940 DOA: 04/05/2013  PCP: Lilyan Punt, MD  Admit date: 04/05/2013 Discharge date: 04/08/2013  Time spent: 40 minutes  Recommendations for Outpatient Follow-up:  1. Appointment with PCP 04/16/13 at 9:50 am for evaluation of symptoms 2. Postpone colonoscopy and EGD   Discharge Diagnoses:  Principal Problem: COPD with acute exacerbation   COPD (chronic obstructive pulmonary disease) Active Problems:   TOBACCO ABUSE   HYPERTENSION   Hyperlipemia   CAD (coronary artery disease)        Discharge Condition: stable  Diet recommendation: heart healthy  Filed Weights   04/05/13 1616  Weight: 72.576 kg (160 lb)    History of present illness:  With a hx of copd who presented to the ED 04/05/13 with complaints of worsening SOB and wheezing, failing to respond to multiple neb treatments. Sx persisted for several days. Reported being primarily indoors around a kerosene heater and believes he may have been breathing in the fumes. In the ED, pt was given a neb tx and 125mg  solumedrol with persistently decreased BS. Hospitalist was consulted for admission   Hospital Course:  1. COPD exacerbation  1.  Admitted and provided with oxygen support as well as scheduled nebs and solumedrol. Somewhat slow to progress. At discharge improved air flow and oxygen at 3L to keep saturation >90%. Will discharge with prednisone taper. Follow up appointment with PCP 04/16/13  2. HTN  1. BP remained stable during hospitalization. Cont home regimen at discharge 3. CAD  1. Asymptomatic during this hospitalization 2. Cont home meds 4. HLD  5. Hx tobacco abuse  1. Cessation done at bedside for     Procedures:  none  Consultations:  none  Discharge Exam: Filed Vitals:   04/08/13 1403  BP: 126/57  Pulse: 92  Temp: 98 F (36.7 C)  Resp: 22    General: frail somewhat chronically ill appearing Cardiovascular: RRR No  MGR No LE edema Respiratory: mild increased work of breathing with conversation. Improved air flow. Few wheezes no crackles  Discharge Instructions   Future Appointments Provider Department Dept Phone   04/16/2013 10:00 AM Babs Sciara, MD [facility] [phone]   06/12/2013 8:30 AM [facility] [place] [facility] [address] [phone]   Eat a light meal the night before the exam Nothing to eat or drink for at least 8 hours before exam No gum chewing, or smoking the morning of the exam. Please take your morning medications with small sips of water, especially blood pressure medication *Very Important* Please wear 2 piece clothing   06/12/2013 9:00 AM Annye Rusk, NP [facility] [phone]   12/07/2013 1:30 PM [facility] [place] [facility] [address] [phone]   12/07/2013 2:00 PM Carma Lair Nickel, NP [facility] [phone]       Medication List         albuterol 108 (90 BASE) MCG/ACT inhaler  Commonly known as:  PROVENTIL HFA;VENTOLIN HFA  Inhale 2 puffs into the lungs every 6 (six) hours as needed for wheezing.     albuterol (2.5 MG/3ML) 0.083% nebulizer solution  Commonly known as:  PROVENTIL  Take 2.5 mg by nebulization every 4 (four) hours as needed for wheezing or shortness of breath.     ALPRAZolam 1 MG tablet  Commonly known as:  XANAX  Take 0.5-1 mg by mouth 3 (three) times daily as needed for anxiety (Takes one-half tablet in  the morning and afternoon. takes one tablet at bedtime daily).     aspirin 81 MG tablet  Take 81 mg by mouth every morning.     benazepril 40 MG tablet  Commonly known as:  LOTENSIN  Take 40 mg by mouth every morning.     citalopram 20 MG tablet  Commonly known as:  CELEXA  Take 20 mg by mouth at bedtime.     furosemide 20 MG tablet  Commonly known as:  LASIX  Take 20 mg by mouth 2 (two) times daily.     HYDROcodone-acetaminophen 10-325  MG per tablet  Commonly known as:  NORCO  Take 1 tablet by mouth 2 (two) times daily as needed (pain).     metoprolol succinate 50 MG 24 hr tablet  Commonly known as:  TOPROL-XL  Take 50 mg by mouth every morning.     nicotine 21 mg/24hr patch  Commonly known as:  NICODERM CQ - dosed in mg/24 hours  Place 1 patch (21 mg total) onto the skin daily.     nitroGLYCERIN 0.4 MG SL tablet  Commonly known as:  NITROSTAT  Place 1 tablet (0.4 mg total) under the tongue every 5 (five) minutes as needed for chest pain.     potassium chloride SA 20 MEQ tablet  Commonly known as:  K-DUR,KLOR-CON  Take 20 mEq by mouth every morning.     predniSONE 10 MG tablet  Commonly known as:  DELTASONE  Take 6 tabs on 12/4 and 12/5, take 4 tabs on 12/6,12/7 and 12/8, take 2 tabs 12/9,12/10 and 12/11 take 1 tab 12/12,12/13 and 12/14 then stop.     tiotropium 18 MCG inhalation capsule  Commonly known as:  SPIRIVA  Place 18 mcg into inhaler and inhale as needed.       No Known Allergies     Follow-up Information   Follow up with Lilyan Punt, MD On 04/16/2013. (appointment at 9:50 am for evaluation of symptoms)    Specialty:  Family Medicine   Contact information:   [address] [place] [address] [phone]        The results of significant diagnostics from this hospitalization (including imaging, microbiology, ancillary and laboratory) are listed below for reference.    Significant Diagnostic Studies: Dg Chest Port 1 View  04/05/2013   CLINICAL DATA:  Emphysema. coronary artery disease. Cough and shortness of breath.  EXAM: PORTABLE CHEST - 1 VIEW  COMPARISON:  02/24/2013 chest CT  FINDINGS: Severe emphysema. Prior CABG. Mildly enlarged cardiopericardial silhouette.  Linear subsegmental atelectasis or scarring at the right lateral lung base. Central airway thickening.  IMPRESSION: 1. Severe emphysema. 2. Mildly enlarged cardiopericardial silhouette. 3. Airway thickening is  present, suggesting bronchitis or reactive airways disease. 4. Subsegmental atelectasis in the lateral basal segment right lower lobe.   Electronically Signed   By: Herbie Baltimore M.D.   On: 04/05/2013 16:07    Microbiology: No results found for this or any previous visit (from the past 240 hour(s)).   Labs: Basic Metabolic Panel:  Recent Labs Lab 04/05/13 1546 04/06/13 0551  NA 134* 138  K 4.3 4.0  CL 93* 100  CO2 30 31  GLUCOSE 122* 194*  BUN 12 11  CREATININE 0.54 0.49*  CALCIUM 10.5 9.8   Liver Function Tests:  Recent Labs Lab 04/06/13 0551  AST 18  ALT 13  ALKPHOS 68  BILITOT 0.6  PROT 6.8  ALBUMIN 3.3*   No results found for this basename:  LIPASE, AMYLASE,  in the last 168 hours No results found for this basename: AMMONIA,  in the last 168 hours CBC:  Recent Labs Lab 04/05/13 1546 04/06/13 0551  WBC 15.2* 9.0  NEUTROABS 12.6*  --   HGB 16.0 14.2  HCT 49.3 43.9  MCV 93.9 94.0  PLT 120* 116*   Cardiac Enzymes:  Recent Labs Lab 04/05/13 1546  TROPONINI <0.30   BNP: BNP (last 3 results) No results found for this basename: PROBNP,  in the last 8760 hours CBG: No results found for this basename: GLUCAP,  in the last 168 hours     Signed:  Gwenyth Bender  [facility] 04/08/2013, 3:01 PM

## 2013-04-09 ENCOUNTER — Telehealth: Payer: Self-pay | Admitting: Internal Medicine

## 2013-04-09 DIAGNOSIS — R739 Hyperglycemia, unspecified: Secondary | ICD-10-CM | POA: Diagnosis not present

## 2013-04-09 DIAGNOSIS — J9601 Acute respiratory failure with hypoxia: Secondary | ICD-10-CM | POA: Diagnosis present

## 2013-04-09 DIAGNOSIS — J449 Chronic obstructive pulmonary disease, unspecified: Secondary | ICD-10-CM | POA: Diagnosis not present

## 2013-04-09 DIAGNOSIS — I2510 Atherosclerotic heart disease of native coronary artery without angina pectoris: Secondary | ICD-10-CM | POA: Diagnosis not present

## 2013-04-09 DIAGNOSIS — J9600 Acute respiratory failure, unspecified whether with hypoxia or hypercapnia: Secondary | ICD-10-CM | POA: Diagnosis not present

## 2013-04-09 DIAGNOSIS — J441 Chronic obstructive pulmonary disease with (acute) exacerbation: Secondary | ICD-10-CM | POA: Diagnosis not present

## 2013-04-09 DIAGNOSIS — I10 Essential (primary) hypertension: Secondary | ICD-10-CM | POA: Diagnosis not present

## 2013-04-09 LAB — CBC
HCT: 48.5 % (ref 39.0–52.0)
MCHC: 31.1 g/dL (ref 30.0–36.0)
MCV: 96.6 fL (ref 78.0–100.0)
Platelets: 154 10*3/uL (ref 150–400)
RBC: 5.02 MIL/uL (ref 4.22–5.81)
RDW: 13.1 % (ref 11.5–15.5)

## 2013-04-09 LAB — BASIC METABOLIC PANEL
BUN: 10 mg/dL (ref 6–23)
Calcium: 10.2 mg/dL (ref 8.4–10.5)
GFR calc Af Amer: >90 mL/min (ref >90)
GFR calc non Af Amer: >90 mL/min (ref >90)
Potassium: 4.5 mEq/L (ref 3.5–5.1)
Sodium: 141 mEq/L (ref 135–145)

## 2013-04-09 LAB — GLUCOSE, CAPILLARY: Glucose-Capillary: 168 mg/dL — ABNORMAL HIGH (ref 70–99)

## 2013-04-09 MED ORDER — INSULIN ASPART 100 UNIT/ML ~~LOC~~ SOLN
0.0000 [IU] | Freq: Every day | SUBCUTANEOUS | Status: DC
  Administered 2013-04-10: 2 [IU] via SUBCUTANEOUS

## 2013-04-09 MED ORDER — INSULIN ASPART 100 UNIT/ML ~~LOC~~ SOLN
0.0000 [IU] | Freq: Three times a day (TID) | SUBCUTANEOUS | Status: DC
  Administered 2013-04-09: 2 [IU] via SUBCUTANEOUS
  Administered 2013-04-09: 3 [IU] via SUBCUTANEOUS
  Administered 2013-04-09: 2 [IU] via SUBCUTANEOUS

## 2013-04-09 NOTE — Progress Notes (Signed)
[facility] PROGRESS NOTE  Tommy Turner ZOX:[id number] DOB: [birth year] DOA: 04/05/2013 PCP: Lilyan Punt, MD  Assessment/Plan:  Acute respiratory failure with hypoxia: related to COPD exacerbation likely triggered by exposure to kerosene fumes. Pt no on oxygen at home and requiring 3L to maintain sats >90%. Very slow to improve in spite of nebs, solumedrol. Appreciate pulmonology assistance. Levaquin, flutter valve, increase in solumedrol recommended. Slight improvement this am. Of note CT on 02/26/13 obtained for cough, weight loss and worsening SOB. No underlying malignancy noted. CT + for pulmonary hypertension that could be secondary to COPD. Continue to monitor closely.  COPD exacerbation  1. May have been triggered from exposure to kerosene fumes. See #1. Continue levaquin day #2, nebs and increased solumedrol as well as flutter valve and symbicort.   HTN  2. BP remains stable,  3. Pt take lasix and BB at home. These are currently on hold. Will Monitor and resume as indicated. May need to change from BB due to above.  CAD  4. Asymptomatic 5. Cont home meds HLD  6. Cont home meds Hx tobacco abuse  7. Cessation done at bedside for       Hyperglycemia: steroid induced. Will monitor CBG's and provide SSI as indicated.   Code Status: full Family Communication: wife at bedside Disposition Plan: home hopefully 48 hours   Consultants:  Dr. Juanetta Gosling pulmonology  Procedures:  none  Antibiotics:  levaquin 04/08/13>>>  HPI/Subjective: Sitting up in bed. Reports some improvement with work of breating. Productive cough with thick sputum  Objective: Filed Vitals:   04/09/13 0443  BP: 125/69  Pulse: 82  Temp: 97.9 F (36.6 C)  Resp: 22    Intake/Output Summary (Last 24 hours) at 04/09/13 0918 Last data filed at 04/08/13 1900  Gross per 24 hour  Intake    720 ml  Output      0 ml  Net    720 ml   Filed Weights   04/05/13 1616  Weight: 72.576 kg (160 lb)     Exam:   General:  Somewhat thin/frail appearing  Cardiovascular: RRR No MGR No LE edema, PPP  Respiratory: mild increased work of breathing with conversation. Using accessory muscles. BS distant in bases but somewhat improved air flow upper lobes. Diffuse rhonchi. No wheeze  Abdomen: flat soft +BS non-tender to palpation  Musculoskeletal: no clubbing no cyanosis   Data Reviewed: Basic Metabolic Panel:  Recent Labs Lab 04/05/13 1546 04/06/13 0551 04/09/13 0524  NA 134* 138 141  K 4.3 4.0 4.5  CL 93* 100 98  CO2 30 31 42*  GLUCOSE 122* 194* 179*  BUN 12 11 10   CREATININE 0.54 0.49* 0.54  CALCIUM 10.5 9.8 10.2   Liver Function Tests:  Recent Labs Lab 04/06/13 0551  AST 18  ALT 13  ALKPHOS 68  BILITOT 0.6  PROT 6.8  ALBUMIN 3.3*   No results found for this basename: LIPASE, AMYLASE,  in the last 168 hours No results found for this basename: AMMONIA,  in the last 168 hours CBC:  Recent Labs Lab 04/05/13 1546 04/06/13 0551 04/09/13 0524  WBC 15.2* 9.0 8.5  NEUTROABS 12.6*  --   --   HGB 16.0 14.2 15.1  HCT 49.3 43.9 48.5  MCV 93.9 94.0 96.6  PLT 120* 116* 154   Cardiac Enzymes:  Recent Labs Lab 04/05/13 1546  TROPONINI <0.30   BNP (last 3 results) No results found for this basename: PROBNP,  in the last 8760 hours  CBG:  Recent Labs Lab 04/09/13 0820  GLUCAP 142*    No results found for this or any previous visit (from the past 240 hour(s)).   Studies: No results found.  Scheduled Meds: . ipratropium  0.5 mg Nebulization Q4H   And  . albuterol  2.5 mg Nebulization Q4H  . budesonide-formoterol  2 puff Inhalation BID  . guaiFENesin  1,200 mg Oral BID  . heparin  5,000 Units Subcutaneous Q8H  . insulin aspart  0-15 Units Subcutaneous TID WC  . insulin aspart  0-5 Units Subcutaneous QHS  . levofloxacin  750 mg Oral Daily  . methylPREDNISolone (SOLU-MEDROL) injection  60 mg Intravenous Q6H  . nicotine  21 mg Transdermal Daily    Continuous Infusions: . sodium chloride      Principal Problem:   Acute respiratory failure with hypoxia Active Problems:   TOBACCO ABUSE   HYPERTENSION   COPD (chronic obstructive pulmonary disease)   Hyperlipemia   CAD (coronary artery disease)   COPD with acute exacerbation   COPD exacerbation   Hyperglycemia    Time spent: 30 minutes    [facility] M  [facility] Pager [phone]. If 7PM-7AM, please contact night-coverage at [email or web address], password [facility] 04/09/2013, 9:18 AM  LOS: 4 days

## 2013-04-09 NOTE — Telephone Encounter (Signed)
Noted. If we haven't heard from him by January 1, lets give him a call

## 2013-04-09 NOTE — Telephone Encounter (Signed)
Noted  

## 2013-04-09 NOTE — Progress Notes (Signed)
Received referral from inpatient RNCM for [facility] Care Management services. Called into the room and spoke with patient's wife who states they were expecting [facility] Care Management's call. Agreeable to [facility] Care Management following post hospital discharge. Patient will receive post hospital discharge call and will be evaluated for monthly home visits. Confirmed best contact number and also provided writer's contact number. Raiford Noble, MSN- Ed, Charity fundraiser, BSN- [facility] [phone]

## 2013-04-09 NOTE — Plan of Care (Signed)
Pt ambulated in hall about 200 ft. Pt was 94% on 4L at rest.  When ambulating - pt quickly fell to 86% on 4L and needed 6L to maintain above 90%.  After returning to room, pt regained breathing pattern after about 5 minutes and maintained 92% on 4L.

## 2013-04-09 NOTE — Consult Note (Signed)
NAMEMARQUAVION, VENHUIZEN               ACCOUNT NO.:  [id number]  MEDICAL RECORD NO.:  [id number]  LOCATION:  A326                          FACILITY:  [facility]  PHYSICIAN:  Kali Ambler L. Juanetta Gosling, M.D.DATE OF BIRTH:  23-Mar-1941  DATE OF CONSULTATION: DATE OF DISCHARGE:                                CONSULTATION   REASON FOR CONSULTATION:  COPD, which is poorly responsive to treatment.  HISTORY:  Mr. Knick is a 72 year old, who has a known history of COPD and who had been in his usual state of fair health at home when he developed increasing shortness of breath.  Apparently, part of the problem is that his family had told him that he should not smoke in the house, so he went outside into a shed and was smoking there using a kerosene heater.  He seems to have developed increasing problems because of exposure to kerosene.  He was seen in the emergency department with increasing shortness of breath and was treated, but not able to be discharged.  He has continued to have trouble in the hospital and there was thought that might be able to be discharged earlier, but he had increasing symptoms and he is still in the hospital with this problem. He says he has never been hospitalized for COPD before.  PAST MEDICAL HISTORY:  Positive for abdominal aortic aneurysm, COPD, coronary artery occlusive disease, peptic ulcer disease, and "prediabetes."  PAST SURGICAL HISTORY:  He had partial gastrectomy many years ago, cardiac surgery, and removal of a lipoma.  SOCIAL HISTORY:  He has about a 50-pack-year smoking history.  He does not use any alcohol.  He does not use any illicit drugs.  He lives at home with his wife.  FAMILY HISTORY:  Positive for cancer in both his mother and his father, COPD in sister.  MEDICATIONS:  At home, he takes albuterol inhaler and albuterol by nebulizer.  He also takes Xanax 1/2 in the morning, 1/2 in the afternoon, and 1 at bedtime; aspirin 81 mg daily, benazepril 40  mg daily, citalopram 20 mg daily, Lasix 20 mg 2 tablets in the morning, Norco 10/325 twice a day as needed, Toprol-XL 50 mg daily, nitroglycerin 0.4 daily, potassium chloride 20 mEq daily, Pravachol 40 mg at bedtime, Spiriva 18 mcg daily.  PHYSICAL EXAMINATION:  VITAL SIGNS:  As recorded. GENERAL:  He looks mildly uncomfortable, but he says because he has just come back from the bathroom. HEENT:  His pupils are reactive.  Nose and throat are clear.  Mucous membranes are moist. NECK:  Supple without masses. CHEST:  Shows that he has some wheezing bilaterally. HEART:  Regular without gallop. ABDOMEN:  Soft.  No masses are felt. EXTREMITIES:  Showed no edema. CENTRAL NERVOUS SYSTEM:  Grossly intact.  Chest x-ray showed basically emphysema and bronchitis.  ASSESSMENT:  He has been in the hospital for several days and has not responded to current treatment.  I think he needs more steroids and I have gone ahead and increased that.  I have given him a flutter valve. He will start on Symbicort to see if that makes any difference. Continue with his other treatments.  I think he is just  having difficulty responding because of the exposure to kerosene and his baseline chronic obstructive pulmonary disease.  Thanks for allowing me to see him with you.     Lorris Carducci L. Juanetta Gosling, M.D.     ELH/MEDQ  D:  04/08/2013  T:  04/09/2013  Job:  [id number]

## 2013-04-09 NOTE — Telephone Encounter (Signed)
Pt's wife called to cancel tcs for Monday with RMR. Patient has been sick and in the hospital. He doesn't feel like he would be up for having this done at this time. Wife said they will call back later to [facility].

## 2013-04-09 NOTE — Progress Notes (Signed)
Patient seen and examined. Above note reviewed.  Patient feels that his breathing is slowly improving.  Appreciate Pulmonology assistance.  Continue with current treatments. Progress has been slow.  MEMON,JEHANZEB

## 2013-04-09 NOTE — Progress Notes (Signed)
Subjective: He says he feels a little better. He is less short of breath. He continues to cough up sputum  Objective: Vital signs in last 24 hours: Temp:  [97.9 F (36.6 C)-98.5 F (36.9 C)] 97.9 F (36.6 C) (12/04 0443) Pulse Rate:  [82-101] 82 (12/04 0443) Resp:  [22] 22 (12/04 0443) BP: (125-126)/(57-70) 125/69 mmHg (12/04 0443) SpO2:  [91 %-97 %] 91 % (12/04 0751) Weight change:  Last BM Date: 04/08/13  Intake/Output from previous day: 12/03 0701 - 12/04 0700 In: 720 [P.O.:720] Out: -   PHYSICAL EXAM General appearance: alert, cooperative and mild distress Resp: rhonchi bilaterally Cardio: regular rate and rhythm, S1, S2 normal, no murmur, click, rub or gallop GI: soft, non-tender; bowel sounds normal; no masses,  no organomegaly Extremities: extremities normal, atraumatic, no cyanosis or edema  Lab Results:    Basic Metabolic Panel:  Recent Labs  81/19/14 0524  NA 141  K 4.5  CL 98  CO2 42*  GLUCOSE 179*  BUN 10  CREATININE 0.54  CALCIUM 10.2   Liver Function Tests: No results found for this basename: AST, ALT, ALKPHOS, BILITOT, PROT, ALBUMIN,  in the last 72 hours No results found for this basename: LIPASE, AMYLASE,  in the last 72 hours No results found for this basename: AMMONIA,  in the last 72 hours CBC:  Recent Labs  04/09/13 0524  WBC 8.5  HGB 15.1  HCT 48.5  MCV 96.6  PLT 154   Cardiac Enzymes: No results found for this basename: CKTOTAL, CKMB, CKMBINDEX, TROPONINI,  in the last 72 hours BNP: No results found for this basename: PROBNP,  in the last 72 hours D-Dimer: No results found for this basename: DDIMER,  in the last 72 hours CBG:  Recent Labs  04/09/13 0820  GLUCAP 142*   Hemoglobin A1C: No results found for this basename: HGBA1C,  in the last 72 hours Fasting Lipid Panel: No results found for this basename: CHOL, HDL, LDLCALC, TRIG, CHOLHDL, LDLDIRECT,  in the last 72 hours Thyroid Function Tests: No results found for  this basename: TSH, T4TOTAL, FREET4, T3FREE, THYROIDAB,  in the last 72 hours Anemia Panel: No results found for this basename: VITAMINB12, FOLATE, FERRITIN, TIBC, IRON, RETICCTPCT,  in the last 72 hours Coagulation: No results found for this basename: LABPROT, INR,  in the last 72 hours Urine Drug Screen: Drugs of Abuse  No results found for this basename: labopia, cocainscrnur, labbenz, amphetmu, thcu, labbarb    Alcohol Level: No results found for this basename: ETH,  in the last 72 hours Urinalysis: No results found for this basename: COLORURINE, APPERANCEUR, LABSPEC, PHURINE, GLUCOSEU, HGBUR, BILIRUBINUR, KETONESUR, PROTEINUR, UROBILINOGEN, NITRITE, LEUKOCYTESUR,  in the last 72 hours Misc. Labs:  ABGS No results found for this basename: PHART, PCO2, PO2ART, TCO2, HCO3,  in the last 72 hours CULTURES No results found for this or any previous visit (from the past 240 hour(s)). Studies/Results: No results found.  Medications:  Prior to Admission:  Prescriptions prior to admission  Medication Sig Dispense Refill  . albuterol (PROVENTIL HFA;VENTOLIN HFA) 108 (90 BASE) MCG/ACT inhaler Inhale 2 puffs into the lungs every 6 (six) hours as needed for wheezing.      Marland Kitchen albuterol (PROVENTIL) (2.5 MG/3ML) 0.083% nebulizer solution Take 2.5 mg by nebulization every 4 (four) hours as needed for wheezing or shortness of breath.      . ALPRAZolam (XANAX) 1 MG tablet Take 0.5-1 mg by mouth 3 (three) times daily as needed for anxiety (  Takes one-half tablet in the morning and afternoon. takes one tablet at bedtime daily).      Marland Kitchen aspirin 81 MG tablet Take 81 mg by mouth every morning.       . benazepril (LOTENSIN) 40 MG tablet Take 40 mg by mouth every morning.       . citalopram (CELEXA) 20 MG tablet Take 20 mg by mouth at bedtime.      . furosemide (LASIX) 20 MG tablet Take 20 mg by mouth 2 (two) times daily.       Marland Kitchen HYDROcodone-acetaminophen (NORCO) 10-325 MG per tablet Take 1 tablet by mouth 2  (two) times daily as needed (pain).  60 tablet  0  . metoprolol (TOPROL-XL) 50 MG 24 hr tablet Take 50 mg by mouth every morning.       . nitroGLYCERIN (NITROSTAT) 0.4 MG SL tablet Place 1 tablet (0.4 mg total) under the tongue every 5 (five) minutes as needed for chest pain.  100 tablet  6  . potassium chloride SA (K-DUR,KLOR-CON) 20 MEQ tablet Take 20 mEq by mouth every morning.       . tiotropium (SPIRIVA) 18 MCG inhalation capsule Place 18 mcg into inhaler and inhale as needed.        Scheduled: . ipratropium  0.5 mg Nebulization Q4H   And  . albuterol  2.5 mg Nebulization Q4H  . budesonide-formoterol  2 puff Inhalation BID  . guaiFENesin  1,200 mg Oral BID  . heparin  5,000 Units Subcutaneous Q8H  . insulin aspart  0-15 Units Subcutaneous TID WC  . insulin aspart  0-5 Units Subcutaneous QHS  . levofloxacin  750 mg Oral Daily  . methylPREDNISolone (SOLU-MEDROL) injection  60 mg Intravenous Q6H  . nicotine  21 mg Transdermal Daily   Continuous: . sodium chloride     WGN:[id number], acetaminophen, albuterol, ipratropium, ondansetron (ZOFRAN) IV, ondansetron  Assesment: He has COPD with acute exacerbation and had acute respiratory failure from that. He is better this morning. He still has rhonchi and is still somewhat short of breath. Principal Problem:   Acute respiratory failure with hypoxia Active Problems:   TOBACCO ABUSE   HYPERTENSION   COPD (chronic obstructive pulmonary disease)   Hyperlipemia   CAD (coronary artery disease)   COPD with acute exacerbation   COPD exacerbation   Hyperglycemia    Plan: Continue current treatments    LOS: 4 days   Indira Sorenson L 04/09/2013, 8:40 AM

## 2013-04-09 NOTE — Plan of Care (Signed)
Critical lab value of 42 CO2, text to Dr. Kerry Hough at [phone].

## 2013-04-10 DIAGNOSIS — J441 Chronic obstructive pulmonary disease with (acute) exacerbation: Secondary | ICD-10-CM | POA: Diagnosis not present

## 2013-04-10 DIAGNOSIS — J9600 Acute respiratory failure, unspecified whether with hypoxia or hypercapnia: Secondary | ICD-10-CM | POA: Diagnosis not present

## 2013-04-10 DIAGNOSIS — F17200 Nicotine dependence, unspecified, uncomplicated: Secondary | ICD-10-CM | POA: Diagnosis not present

## 2013-04-10 DIAGNOSIS — I10 Essential (primary) hypertension: Secondary | ICD-10-CM | POA: Diagnosis not present

## 2013-04-10 LAB — BASIC METABOLIC PANEL
BUN: 13 mg/dL (ref 6–23)
Calcium: 10.4 mg/dL (ref 8.4–10.5)
Chloride: 98 mEq/L (ref 96–112)
Creatinine, Ser: 0.57 mg/dL (ref 0.50–1.35)
GFR calc Af Amer: >90 mL/min (ref >90)
GFR calc non Af Amer: >90 mL/min (ref >90)
Glucose, Bld: 184 mg/dL — ABNORMAL HIGH (ref 70–99)

## 2013-04-10 LAB — GLUCOSE, CAPILLARY: Glucose-Capillary: 257 mg/dL — ABNORMAL HIGH (ref 70–99)

## 2013-04-10 MED ORDER — INSULIN ASPART 100 UNIT/ML ~~LOC~~ SOLN
0.0000 [IU] | Freq: Three times a day (TID) | SUBCUTANEOUS | Status: DC
  Administered 2013-04-10: 11 [IU] via SUBCUTANEOUS
  Administered 2013-04-11: 7 [IU] via SUBCUTANEOUS
  Administered 2013-04-11: 3 [IU] via SUBCUTANEOUS
  Administered 2013-04-11: 7 [IU] via SUBCUTANEOUS
  Administered 2013-04-12 (×2): 3 [IU] via SUBCUTANEOUS

## 2013-04-10 NOTE — Progress Notes (Signed)
[facility] PROGRESS NOTE  Tommy Turner ZOX:[id number] DOB: [birth year] DOA: 04/05/2013 PCP: Lilyan Punt, MD  Assessment/Plan: Acute respiratory failure with hypoxia: related to COPD exacerbation likely triggered by exposure to kerosene fumes. Pt not on oxygen at home and requiring 3L to maintain sats >90%. Slight improvement today. Very slow progress overall. Will continue nebs, solumedrol, flutter valve and levaquin day #2.  Appreciate pulmonology assistance. Of note CT on 02/26/13 obtained for cough, weight loss and worsening SOB. No underlying malignancy noted. CT + for pulmonary hypertension that could be secondary to COPD. Continue to monitor closely.   COPD exacerbation  1. May have been triggered from exposure to kerosene fumes. See #1. Continue levaquin day #3, nebs and increased solumedrol as well as flutter valve and symbicort per Dr. Juanetta Gosling  HTN 2. BP remains stable,  3. Pt take lasix and BB at home. These are currently on hold. Will Monitor and resume as indicated. May need to change from BB due to above.  CAD  4. Asymptomatic.Cont home meds 5. No chest pain. HLD  6. Cont home meds Hx tobacco abuse  7. Cessation done at bedside for   HypHyperglycemia: steroid induced. CBG's 180's. Will increase SSI to resistant. If no better control will consider meal coverage as patient's appetite very good.     Code Status: full Family Communication: wife at bedside Disposition Plan: home when ready hopefully 24-48 hours   Consultants:  pulmonology  Procedures:  none  Antibiotics:  levaquin 04/08/13>>>  HPI/Subjective: Sitting up eating breakfast. Reports improved breathing. Slept better last night  Objective: Filed Vitals:   04/10/13 0625  BP: 120/72  Pulse: 89  Temp: 97.7 F (36.5 C)  Resp: 22   No intake or output data in the 24 hours ending 04/10/13 0908 Filed Weights   04/05/13 1616  Weight: 72.576 kg (160 lb)    Exam:   General:  Thin  comfortable NAD  Cardiovascular: RRR somewhat distant No MGR No LEE PPP  Respiratory: no increased work of breathing with conversation. BS remain distant with improve air flow and coarse. No wheeze  Abdomen: flat soft +BS non-tender to palpation   Musculoskeletal: No clubbing or cyanosis  Data Reviewed: Basic Metabolic Panel:  Recent Labs Lab 04/05/13 1546 04/06/13 0551 04/09/13 0524 04/10/13 0625  NA 134* 138 141 143  K 4.3 4.0 4.5 4.7  CL 93* 100 98 98  CO2 30 31 42* 44*  GLUCOSE 122* 194* 179* 184*  BUN 12 11 10 13   CREATININE 0.54 0.49* 0.54 0.57  CALCIUM 10.5 9.8 10.2 10.4   Liver Function Tests:  Recent Labs Lab 04/06/13 0551  AST 18  ALT 13  ALKPHOS 68  BILITOT 0.6  PROT 6.8  ALBUMIN 3.3*   No results found for this basename: LIPASE, AMYLASE,  in the last 168 hours No results found for this basename: AMMONIA,  in the last 168 hours CBC:  Recent Labs Lab 04/05/13 1546 04/06/13 0551 04/09/13 0524  WBC 15.2* 9.0 8.5  NEUTROABS 12.6*  --   --   HGB 16.0 14.2 15.1  HCT 49.3 43.9 48.5  MCV 93.9 94.0 96.6  PLT 120* 116* 154   Cardiac Enzymes:  Recent Labs Lab 04/05/13 1546  TROPONINI <0.30   BNP (last 3 results) No results found for this basename: PROBNP,  in the last 8760 hours CBG:  Recent Labs Lab 04/09/13 0820 04/09/13 1138 04/09/13 1651  GLUCAP 142* 168* 133*    No results found for  this or any previous visit (from the past 240 hour(s)).   Studies: No results found.  Scheduled Meds: . ipratropium  0.5 mg Nebulization Q4H   And  . albuterol  2.5 mg Nebulization Q4H  . budesonide-formoterol  2 puff Inhalation BID  . guaiFENesin  1,200 mg Oral BID  . heparin  5,000 Units Subcutaneous Q8H  . insulin aspart  0-15 Units Subcutaneous TID WC  . insulin aspart  0-5 Units Subcutaneous QHS  . levofloxacin  750 mg Oral Daily  . methylPREDNISolone (SOLU-MEDROL) injection  60 mg Intravenous Q6H  . nicotine  21 mg Transdermal Daily    Continuous Infusions: . sodium chloride      Principal Problem:   Acute respiratory failure with hypoxia Active Problems:   TOBACCO ABUSE   HYPERTENSION   COPD (chronic obstructive pulmonary disease)   Hyperlipemia   CAD (coronary artery disease)   COPD with acute exacerbation   COPD exacerbation   Hyperglycemia    Time spent: 30 minutes    [facility]  [facility] Pager [phone] PM-7AM, please contact night-coverage at [email or web address], password [facility] 04/10/2013, 9:08 AM  LOS: 5 days

## 2013-04-10 NOTE — Progress Notes (Signed)
Patient seen and examined. Agree with not as above.  The patient does feel like he is improving today. He continues to cough up phlegm. Feels his breathing is getting better. He still does appear short of breath and becomes hypoxic on rest and ambulation on room. We'll need to continue with current treatments. Appreciate pulmonology assistance.  Kylee Nardozzi

## 2013-04-10 NOTE — Progress Notes (Signed)
Subjective: He says he feels much better today. He has no new complaints.  Objective: Vital signs in last 24 hours: Temp:  [97.7 F (36.5 C)-98.4 F (36.9 C)] 97.7 F (36.5 C) (12/05 0625) Pulse Rate:  [88-94] 89 (12/05 0625) Resp:  [22] 22 (12/05 0625) BP: (120-125)/(72-79) 120/72 mmHg (12/05 0625) SpO2:  [93 %-97 %] 96 % (12/05 0735) Weight change:  Last BM Date: 04/09/13  Intake/Output from previous day:    PHYSICAL EXAM General appearance: alert, cooperative, no distress and He looks more comfortable and less dyspneic Resp: clear to auscultation bilaterally Cardio: regular rate and rhythm, S1, S2 normal, no murmur, click, rub or gallop GI: soft, non-tender; bowel sounds normal; no masses,  no organomegaly Extremities: extremities normal, atraumatic, no cyanosis or edema  Lab Results:    Basic Metabolic Panel:  Recent Labs  16/10/96 0524 04/10/13 0625  NA 141 143  K 4.5 4.7  CL 98 98  CO2 42* 44*  GLUCOSE 179* 184*  BUN 10 13  CREATININE 0.54 0.57  CALCIUM 10.2 10.4   Liver Function Tests: No results found for this basename: AST, ALT, ALKPHOS, BILITOT, PROT, ALBUMIN,  in the last 72 hours No results found for this basename: LIPASE, AMYLASE,  in the last 72 hours No results found for this basename: AMMONIA,  in the last 72 hours CBC:  Recent Labs  04/09/13 0524  WBC 8.5  HGB 15.1  HCT 48.5  MCV 96.6  PLT 154   Cardiac Enzymes: No results found for this basename: CKTOTAL, CKMB, CKMBINDEX, TROPONINI,  in the last 72 hours BNP: No results found for this basename: PROBNP,  in the last 72 hours D-Dimer: No results found for this basename: DDIMER,  in the last 72 hours CBG:  Recent Labs  04/09/13 0820 04/09/13 1138 04/09/13 1651  GLUCAP 142* 168* 133*   Hemoglobin A1C: No results found for this basename: HGBA1C,  in the last 72 hours Fasting Lipid Panel: No results found for this basename: CHOL, HDL, LDLCALC, TRIG, CHOLHDL, LDLDIRECT,  in the  last 72 hours Thyroid Function Tests: No results found for this basename: TSH, T4TOTAL, FREET4, T3FREE, THYROIDAB,  in the last 72 hours Anemia Panel: No results found for this basename: VITAMINB12, FOLATE, FERRITIN, TIBC, IRON, RETICCTPCT,  in the last 72 hours Coagulation: No results found for this basename: LABPROT, INR,  in the last 72 hours Urine Drug Screen: Drugs of Abuse  No results found for this basename: labopia, cocainscrnur, labbenz, amphetmu, thcu, labbarb    Alcohol Level: No results found for this basename: ETH,  in the last 72 hours Urinalysis: No results found for this basename: COLORURINE, APPERANCEUR, LABSPEC, PHURINE, GLUCOSEU, HGBUR, BILIRUBINUR, KETONESUR, PROTEINUR, UROBILINOGEN, NITRITE, LEUKOCYTESUR,  in the last 72 hours Misc. Labs:  ABGS No results found for this basename: PHART, PCO2, PO2ART, TCO2, HCO3,  in the last 72 hours CULTURES No results found for this or any previous visit (from the past 240 hour(s)). Studies/Results: No results found.  Medications:  Prior to Admission:  Prescriptions prior to admission  Medication Sig Dispense Refill  . albuterol (PROVENTIL HFA;VENTOLIN HFA) 108 (90 BASE) MCG/ACT inhaler Inhale 2 puffs into the lungs every 6 (six) hours as needed for wheezing.      Marland Kitchen albuterol (PROVENTIL) (2.5 MG/3ML) 0.083% nebulizer solution Take 2.5 mg by nebulization every 4 (four) hours as needed for wheezing or shortness of breath.      . ALPRAZolam (XANAX) 1 MG tablet Take 0.5-1 mg by  mouth 3 (three) times daily as needed for anxiety (Takes one-half tablet in the morning and afternoon. takes one tablet at bedtime daily).      Marland Kitchen aspirin 81 MG tablet Take 81 mg by mouth every morning.       . benazepril (LOTENSIN) 40 MG tablet Take 40 mg by mouth every morning.       . citalopram (CELEXA) 20 MG tablet Take 20 mg by mouth at bedtime.      . furosemide (LASIX) 20 MG tablet Take 20 mg by mouth 2 (two) times daily.       Marland Kitchen  HYDROcodone-acetaminophen (NORCO) 10-325 MG per tablet Take 1 tablet by mouth 2 (two) times daily as needed (pain).  60 tablet  0  . metoprolol (TOPROL-XL) 50 MG 24 hr tablet Take 50 mg by mouth every morning.       . nitroGLYCERIN (NITROSTAT) 0.4 MG SL tablet Place 1 tablet (0.4 mg total) under the tongue every 5 (five) minutes as needed for chest pain.  100 tablet  6  . potassium chloride SA (K-DUR,KLOR-CON) 20 MEQ tablet Take 20 mEq by mouth every morning.       . tiotropium (SPIRIVA) 18 MCG inhalation capsule Place 18 mcg into inhaler and inhale as needed.        Scheduled: . ipratropium  0.5 mg Nebulization Q4H   And  . albuterol  2.5 mg Nebulization Q4H  . budesonide-formoterol  2 puff Inhalation BID  . guaiFENesin  1,200 mg Oral BID  . heparin  5,000 Units Subcutaneous Q8H  . insulin aspart  0-15 Units Subcutaneous TID WC  . insulin aspart  0-5 Units Subcutaneous QHS  . levofloxacin  750 mg Oral Daily  . methylPREDNISolone (SOLU-MEDROL) injection  60 mg Intravenous Q6H  . nicotine  21 mg Transdermal Daily   Continuous: . sodium chloride     ZOX:[id number], acetaminophen, albuterol, ipratropium, ondansetron (ZOFRAN) IV, ondansetron  Assesment: He was admitted with COPD exacerbation and acute respiratory failure with hypoxia. He is improved now. Principal Problem:   Acute respiratory failure with hypoxia Active Problems:   TOBACCO ABUSE   HYPERTENSION   COPD (chronic obstructive pulmonary disease)   Hyperlipemia   CAD (coronary artery disease)   COPD with acute exacerbation   COPD exacerbation   Hyperglycemia    Plan: Continue treatment    LOS: 5 days   Tommy Turner L 04/10/2013, 8:28 AM

## 2013-04-10 NOTE — Progress Notes (Signed)
Patient's O2 sats at rest 87% on RA.  Pt's O2 sats while ambulating on RA 86%.  O2 at 3 liters applied to patient and sats now 93% on 3L [place].

## 2013-04-10 NOTE — Progress Notes (Signed)
CRITICAL VALUE ALERT  Critical value received:  CO2 44  Date of notification:  [id number]  Time of notification:  0730  Critical value read back:yes  Nurse who received alert:  Rexford Maus  MD notified (1st page):  Dr. Kerry Hough  Time of first page:  0745  MD notified (2nd page):  Time of second page:  Responding MD:  Dr. Kerry Hough to see patient.  Time MD responded:

## 2013-04-11 DIAGNOSIS — I10 Essential (primary) hypertension: Secondary | ICD-10-CM | POA: Diagnosis not present

## 2013-04-11 DIAGNOSIS — J441 Chronic obstructive pulmonary disease with (acute) exacerbation: Secondary | ICD-10-CM | POA: Diagnosis not present

## 2013-04-11 DIAGNOSIS — J9600 Acute respiratory failure, unspecified whether with hypoxia or hypercapnia: Secondary | ICD-10-CM | POA: Diagnosis not present

## 2013-04-11 DIAGNOSIS — I2510 Atherosclerotic heart disease of native coronary artery without angina pectoris: Secondary | ICD-10-CM | POA: Diagnosis not present

## 2013-04-11 LAB — GLUCOSE, CAPILLARY
Glucose-Capillary: 142 mg/dL — ABNORMAL HIGH (ref 70–99)
Glucose-Capillary: 201 mg/dL — ABNORMAL HIGH (ref 70–99)

## 2013-04-11 LAB — BASIC METABOLIC PANEL
BUN: 13 mg/dL (ref 6–23)
CO2: 42 mEq/L (ref 19–32)
Calcium: 10 mg/dL (ref 8.4–10.5)
Chloride: 92 mEq/L — ABNORMAL LOW (ref 96–112)
Creatinine, Ser: 0.58 mg/dL (ref 0.50–1.35)
Sodium: 142 mEq/L (ref 135–145)

## 2013-04-11 MED ORDER — PREDNISONE 20 MG PO TABS
50.0000 mg | ORAL_TABLET | Freq: Every day | ORAL | Status: DC
  Administered 2013-04-11 – 2013-04-12 (×2): 50 mg via ORAL
  Filled 2013-04-11: qty 1
  Filled 2013-04-11 (×2): qty 2
  Filled 2013-04-11: qty 1

## 2013-04-11 NOTE — Progress Notes (Signed)
[facility] PROGRESS NOTE  Tommy Turner RUE:[id number] DOB: 1940-06-30 DOA: 04/05/2013 PCP: Lilyan Punt, MD  Assessment/Plan: Acute respiratory failure with hypoxia: related to COPD exacerbation likely triggered by exposure to kerosene fumes. Pt not on oxygen at home and requiring 3L to maintain sats >90%. Continues to improve today. Very slow progress overall. Will continue nebs, steroids flutter valve and levaquin day #3.  Appreciate pulmonology assistance. Of note CT on 02/26/13 obtained for cough, weight loss and worsening SOB. No underlying malignancy noted. CT + for pulmonary hypertension that could be secondary to COPD. Continue to monitor closely. Agree that he will likely need to discharge home on oxygen temporarily  COPD exacerbation  1. solumedrol changed to prednisone today, continue nebs.  On symbicort.  Slowly improving.  HTN 2. BP remains stable,  3. Pt take lasix and BB at home. These are currently on hold. Will Monitor and resume as indicated. May need to change from BB due to above.  CAD  4. Asymptomatic.Cont home meds 5. No chest pain. HLD   6. Cont home meds Tpx tobacco abuse  7. Cessation done at bedside for   HypHyperglycemia: steroid induced. CBG's 180's. Will increase SSI to resistant. If no better control will consider meal coverage as patient's appetite very good.     Code Status: full Family Communication: wife at bedside Disposition Plan: home when ready hopefully tomorrow   Consultants:  pulmonology  Procedures:  none  Antibiotics:  levaquin 04/08/13>>>  HPI/Subjective: Feels that breathing is improving, continues to produce sputum  Objective: Filed Vitals:   04/11/13 0628  BP: 127/67  Pulse: 75  Temp: 98.6 F (37 C)  Resp: 20    Intake/Output Summary (Last 24 hours) at 04/11/13 1415 Last data filed at 04/10/13 1700  Gross per 24 hour  Intake    240 ml  Output      0 ml  Net    240 ml   Filed Weights   04/05/13  1616  Weight: 72.576 kg (160 lb)    Exam:   General:  Thin comfortable NAD  Cardiovascular: RRR somewhat distant No MGR No LEE PPP  Respiratory: no wheezing, breath sounds are clear, work of breathing appears to be improving  Abdomen: flat soft +BS non-tender to palpation   Musculoskeletal: No clubbing or cyanosis  Data Reviewed: Basic Metabolic Panel:  Recent Labs Lab 04/05/13 1546 04/06/13 0551 04/09/13 0524 04/10/13 0625 04/11/13 0653  NA 134* 138 141 143 142  K 4.3 4.0 4.5 4.7 4.8  CL 93* 100 98 98 92*  CO2 30 31 42* 44* 42*  GLUCOSE 122* 194* 179* 184* 168*  BUN 12 11 10 13 13   CREATININE 0.54 0.49* 0.54 0.57 0.58  CALCIUM 10.5 9.8 10.2 10.4 10.0   Liver Function Tests:  Recent Labs Lab 04/06/13 0551  AST 18  ALT 13  ALKPHOS 68  BILITOT 0.6  PROT 6.8  ALBUMIN 3.3*   No results found for this basename: LIPASE, AMYLASE,  in the last 168 hours No results found for this basename: AMMONIA,  in the last 168 hours CBC:  Recent Labs Lab 04/05/13 1546 04/06/13 0551 04/09/13 0524  WBC 15.2* 9.0 8.5  NEUTROABS 12.6*  --   --   HGB 16.0 14.2 15.1  HCT 49.3 43.9 48.5  MCV 93.9 94.0 96.6  PLT 120* 116* 154   Cardiac Enzymes:  Recent Labs Lab 04/05/13 1546  TROPONINI <0.30   BNP (last 3 results) No results found for  this basename: PROBNP,  in the last 8760 hours CBG:  Recent Labs Lab 04/10/13 1135 04/10/13 1627 04/10/13 2314 04/11/13 0740 04/11/13 1135  GLUCAP 257* 115* 201* 142* 201*    No results found for this or any previous visit (from the past 240 hour(s)).   Studies: No results found.  Scheduled Meds: . ipratropium  0.5 mg Nebulization Q4H   And  . albuterol  2.5 mg Nebulization Q4H  . budesonide-formoterol  2 puff Inhalation BID  . guaiFENesin  1,200 mg Oral BID  . heparin  5,000 Units Subcutaneous Q8H  . insulin aspart  0-20 Units Subcutaneous TID WC  . insulin aspart  0-5 Units Subcutaneous QHS  . levofloxacin  750 mg  Oral Daily  . nicotine  21 mg Transdermal Daily  . predniSONE  50 mg Oral Q breakfast   Continuous Infusions: . sodium chloride      Principal Problem:   Acute respiratory failure with hypoxia Active Problems:   TOBACCO ABUSE   HYPERTENSION   COPD (chronic obstructive pulmonary disease)   Hyperlipemia   CAD (coronary artery disease)   COPD with acute exacerbation   COPD exacerbation   Hyperglycemia    Time spent: 25 minutes    MEMON,JEHANZEB  [facility] Pager [phone] PM-7AM, please contact night-coverage at [email or web address], password [facility] 04/11/2013, 2:15 PM  LOS: 6 days

## 2013-04-11 NOTE — Progress Notes (Signed)
Subjective: He says he feels better. He is still coughing up sputum. He has no new  Objective: Vital signs in last 24 hours: Temp:  [97.6 F (36.4 C)-98.6 F (37 C)] 98.6 F (37 C) (12/06 0628) Pulse Rate:  [73-76] 75 (12/06 0628) Resp:  [20] 20 (12/06 0628) BP: (108-127)/(56-70) 127/67 mmHg (12/06 0628) SpO2:  [90 %-96 %] 90 % (12/06 0727) Weight change:  Last BM Date: 04/10/13  Intake/Output from previous day: 12/05 0701 - 12/06 0700 In: 840 [P.O.:840] Out: -   PHYSICAL EXAM General appearance: alert, cooperative and mild distress Resp: rhonchi bilaterally Cardio: regular rate and rhythm, S1, S2 normal, no murmur, click, rub or gallop GI: soft, non-tender; bowel sounds normal; no masses,  no organomegaly Extremities: extremities normal, atraumatic, no cyanosis or edema  Lab Results:    Basic Metabolic Panel:  Recent Labs  47/82/95 0625 04/11/13 0653  NA 143 142  K 4.7 4.8  CL 98 92*  CO2 44* 42*  GLUCOSE 184* 168*  BUN 13 13  CREATININE 0.57 0.58  CALCIUM 10.4 10.0   Liver Function Tests: No results found for this basename: AST, ALT, ALKPHOS, BILITOT, PROT, ALBUMIN,  in the last 72 hours No results found for this basename: LIPASE, AMYLASE,  in the last 72 hours No results found for this basename: AMMONIA,  in the last 72 hours CBC:  Recent Labs  04/09/13 0524  WBC 8.5  HGB 15.1  HCT 48.5  MCV 96.6  PLT 154   Cardiac Enzymes: No results found for this basename: CKTOTAL, CKMB, CKMBINDEX, TROPONINI,  in the last 72 hours BNP: No results found for this basename: PROBNP,  in the last 72 hours D-Dimer: No results found for this basename: DDIMER,  in the last 72 hours CBG:  Recent Labs  04/09/13 1651 04/10/13 0912 04/10/13 1135 04/10/13 1627 04/10/13 2314 04/11/13 0740  GLUCAP 133* 183* 257* 115* 201* 142*   Hemoglobin A1C: No results found for this basename: HGBA1C,  in the last 72 hours Fasting Lipid Panel: No results found for this  basename: CHOL, HDL, LDLCALC, TRIG, CHOLHDL, LDLDIRECT,  in the last 72 hours Thyroid Function Tests: No results found for this basename: TSH, T4TOTAL, FREET4, T3FREE, THYROIDAB,  in the last 72 hours Anemia Panel: No results found for this basename: VITAMINB12, FOLATE, FERRITIN, TIBC, IRON, RETICCTPCT,  in the last 72 hours Coagulation: No results found for this basename: LABPROT, INR,  in the last 72 hours Urine Drug Screen: Drugs of Abuse  No results found for this basename: labopia, cocainscrnur, labbenz, amphetmu, thcu, labbarb    Alcohol Level: No results found for this basename: ETH,  in the last 72 hours Urinalysis: No results found for this basename: COLORURINE, APPERANCEUR, LABSPEC, PHURINE, GLUCOSEU, HGBUR, BILIRUBINUR, KETONESUR, PROTEINUR, UROBILINOGEN, NITRITE, LEUKOCYTESUR,  in the last 72 hours Misc. Labs:  ABGS No results found for this basename: PHART, PCO2, PO2ART, TCO2, HCO3,  in the last 72 hours CULTURES No results found for this or any previous visit (from the past 240 hour(s)). Studies/Results: No results found.  Medications:  Scheduled: . ipratropium  0.5 mg Nebulization Q4H   And  . albuterol  2.5 mg Nebulization Q4H  . budesonide-formoterol  2 puff Inhalation BID  . guaiFENesin  1,200 mg Oral BID  . heparin  5,000 Units Subcutaneous Q8H  . insulin aspart  0-20 Units Subcutaneous TID WC  . insulin aspart  0-5 Units Subcutaneous QHS  . levofloxacin  750 mg Oral Daily  .  nicotine  21 mg Transdermal Daily  . predniSONE  50 mg Oral Q breakfast   Continuous: . sodium chloride     WUJ:[id number], acetaminophen, albuterol, ipratropium, ondansetron (ZOFRAN) IV, ondansetron  Assesment: He was admitted with acute hypoxic respiratory failure. He has COPD. Some of this is related to being exposed to a kerosene heater in an enclosed building. It looks like he probably will require oxygen at home at least temporarily Principal Problem:   Acute respiratory  failure with hypoxia Active Problems:   TOBACCO ABUSE   HYPERTENSION   COPD (chronic obstructive pulmonary disease)   Hyperlipemia   CAD (coronary artery disease)   COPD with acute exacerbation   COPD exacerbation   Hyperglycemia    Plan: I switched him to oral prednisone from his current IV Solu-Medrol    LOS: 6 days   Miarose Lippert L 04/11/2013, 9:38 AM

## 2013-04-11 NOTE — Progress Notes (Signed)
CRITICAL VALUE ALERT  Critical value received:  CO2 42  Date of notification:  04/11/2013  Time of notification:  0816  Critical value read back:yes  Nurse who received alert:  Fara Chute  MD notified (1st page):  Dr. Kerry Hough  Time of first page:  0821  MD notified (2nd page):  Time of second page:  Responding MD:    Time MD responded:  Dr. Juanetta Gosling and Dr. Kerry Hough to see patient this morning.

## 2013-04-12 DIAGNOSIS — I10 Essential (primary) hypertension: Secondary | ICD-10-CM | POA: Diagnosis not present

## 2013-04-12 DIAGNOSIS — J441 Chronic obstructive pulmonary disease with (acute) exacerbation: Secondary | ICD-10-CM | POA: Diagnosis not present

## 2013-04-12 DIAGNOSIS — I2510 Atherosclerotic heart disease of native coronary artery without angina pectoris: Secondary | ICD-10-CM | POA: Diagnosis not present

## 2013-04-12 DIAGNOSIS — J9600 Acute respiratory failure, unspecified whether with hypoxia or hypercapnia: Secondary | ICD-10-CM | POA: Diagnosis not present

## 2013-04-12 LAB — GLUCOSE, CAPILLARY
Glucose-Capillary: 134 mg/dL — ABNORMAL HIGH (ref 70–99)
Glucose-Capillary: 123 mg/dL — ABNORMAL HIGH (ref 70–99)

## 2013-04-12 MED ORDER — PREDNISONE 10 MG PO TABS: ORAL_TABLET | ORAL | Status: DC

## 2013-04-12 MED ORDER — BUDESONIDE-FORMOTEROL FUMARATE 160-4.5 MCG/ACT IN AERO: 2.0000 | INHALATION_SPRAY | Freq: Two times a day (BID) | RESPIRATORY_TRACT | Status: DC

## 2013-04-12 NOTE — Discharge Summary (Signed)
Physician Discharge Summary  Tommy Turner VHQ:[id number] DOB: [birth year] DOA: 04/05/2013  PCP: Lilyan Punt, MD  Admit date: 04/05/2013 Discharge date: 04/12/2013  Time spent: 45 minutes  Recommendations for Outpatient Follow-up:  1. Patient has been scheduled to see his primary care doctor on 12/11 2. He will be discharge home with supplemental oxygen at 2L 3. Metoprolol has been discontinued since patient is normotensive and severe COPD  Discharge Diagnoses:  Principal Problem:   Acute respiratory failure with hypoxia Active Problems:   TOBACCO ABUSE   HYPERTENSION   COPD (chronic obstructive pulmonary disease)   Hyperlipemia   CAD (coronary artery disease)   COPD with acute exacerbation   COPD exacerbation   Hyperglycemia   Discharge Condition: improved  Diet recommendation: low salt, low carb  Filed Weights   04/05/13 1616  Weight: 72.576 kg (160 lb)    History of present illness:  With a hx of copd who presented to the ED 04/05/13 with complaints of worsening SOB and wheezing, failing to respond to multiple neb treatments. Sx persisted for several days. Reported being primarily indoors around a kerosene heater and believes he may have been breathing in the fumes. In the ED, pt was given a neb tx and 125mg  solumedrol with persistently decreased BS. Hospitalist was consulted for admission   Hospital Course:  This gentleman was admitted to the hospital with shortness of breath due to copd exacerbation. He was seen by pulmonology while in the hospital and started on symbicort in addition to intravenous steroids, nebs and antibiotics. His progress was slow in the hospital, but he did eventually improve.  He is now ambulating comfortably on 2L [place] and still gets hypoxic on room air.  I suspect that he may need supplemental oxygen for the next few weeks, but this can be followed up  By his primary care physician.  Toprol was discontinued due to his severe COPD.  He has remained  normotensive since then.  If another antihypertensive is desired, would consider calcium channel blocker. He has been advised to abstain from smoking.  He is now on a prednisone taper, continued on nebs and has completed antibiotics.  He is ready for discharge home.  Procedures:  none  Consultations:  Pulmonology  Discharge Exam: Filed Vitals:   04/12/13 0559  BP: 124/54  Pulse: 85  Temp: 97 F (36.1 C)  Resp: 18    General: NAD Cardiovascular: S1, S2 RRR Respiratory: CTA B, good air movement, no wheeze  Discharge Instructions  Discharge Orders   Future Appointments Provider Department Dept Phone   04/16/2013 10:00 AM Babs Sciara, MD [facility] [phone]   06/12/2013 8:30 AM [facility] [place] [facility] [address] [phone]   Eat a light meal the night before the exam Nothing to eat or drink for at least 8 hours before exam No gum chewing, or smoking the morning of the exam. Please take your morning medications with small sips of water, especially blood pressure medication *Very Important* Please wear 2 piece clothing   06/12/2013 9:00 AM Annye Rusk, NP [facility] [phone]   12/07/2013 1:30 PM [facility] [place] [facility] [address] [phone]   12/07/2013 2:00 PM Carma Lair Nickel, NP [facility] [phone]   Future Orders Complete By Expires   Call MD for:  difficulty breathing, headache or visual disturbances  As directed    Call MD for:  temperature >100.4  As directed  Diet - low sodium heart healthy  As directed    Face-to-face encounter (required for Medicare/Medicaid patients)  As directed    Comments:     I Caiya Bettes certify that this patient is under my care and that I, or a nurse practitioner or physician's assistant working with me, had a face-to-face encounter that meets the physician face-to-face encounter requirements with this patient on  04/12/2013. The encounter with the patient was in whole, or in part for the following medical condition(s) which is the primary reason for home health care (List medical condition): Patient admitted with shortness of breath due to COPD exacerbation, being discharged on home oxygen.  Would benefit from home health RN   Questions:     The encounter with the patient was in whole, or in part, for the following medical condition, which is the primary reason for home health care:  copd exacerbation   I certify that, based on my findings, the following services are medically necessary home health services:  Nursing   My clinical findings support the need for the above services:  Shortness of breath with activity   Further, I certify that my clinical findings support that this patient is homebound due to:  Shortness of Breath with activity   Reason for Medically Necessary Home Health Services:  Skilled Nursing- Teaching of Disease Process/Symptom Management   For home use only DME oxygen  As directed    Questions:     Mode or (Route):  Nasal cannula   Liters per Minute:  2   Frequency:  Continuous (stationary and portable oxygen unit needed)   Oxygen conserving device:  Yes   Home Health  As directed    Comments:     Patient will be discharged on oxygen 2L/min via nasal cannula, continuous   Questions:     To provide the following care/treatments:  RN   Increase activity slowly  As directed        Medication List    STOP taking these medications       metoprolol succinate 50 MG 24 hr tablet  Commonly known as:  TOPROL-XL      TAKE these medications       albuterol 108 (90 BASE) MCG/ACT inhaler  Commonly known as:  PROVENTIL HFA;VENTOLIN HFA  Inhale 2 puffs into the lungs every 6 (six) hours as needed for wheezing.     albuterol (2.5 MG/3ML) 0.083% nebulizer solution  Commonly known as:  PROVENTIL  Take 2.5 mg by nebulization every 4 (four) hours as needed for wheezing or shortness of  breath.     ALPRAZolam 1 MG tablet  Commonly known as:  XANAX  Take 0.5-1 mg by mouth 3 (three) times daily as needed for anxiety (Takes one-half tablet in the morning and afternoon. takes one tablet at bedtime daily).     aspirin 81 MG tablet  Take 81 mg by mouth every morning.     benazepril 40 MG tablet  Commonly known as:  LOTENSIN  Take 40 mg by mouth every morning.     budesonide-formoterol 160-4.5 MCG/ACT inhaler  Commonly known as:  SYMBICORT  Inhale 2 puffs into the lungs 2 (two) times daily.     citalopram 20 MG tablet  Commonly known as:  CELEXA  Take 20 mg by mouth at bedtime.     furosemide 20 MG tablet  Commonly known as:  LASIX  Take 20 mg by mouth 2 (two) times daily.     HYDROcodone-acetaminophen 10-325  MG per tablet  Commonly known as:  NORCO  Take 1 tablet by mouth 2 (two) times daily as needed (pain).     nicotine 21 mg/24hr patch  Commonly known as:  NICODERM CQ - dosed in mg/24 hours  Place 1 patch (21 mg total) onto the skin daily.     nitroGLYCERIN 0.4 MG SL tablet  Commonly known as:  NITROSTAT  Place 1 tablet (0.4 mg total) under the tongue every 5 (five) minutes as needed for chest pain.     potassium chloride SA 20 MEQ tablet  Commonly known as:  K-DUR,KLOR-CON  Take 20 mEq by mouth every morning.     predniSONE 10 MG tablet  Commonly known as:  DELTASONE  Take 6 tabs on 12/4 and 12/5, take 4 tabs on 12/6,12/7 and 12/8, take 2 tabs 12/9,12/10 and 12/11 take 1 tab 12/12,12/13 and 12/14 then stop.     tiotropium 18 MCG inhalation capsule  Commonly known as:  SPIRIVA  Place 18 mcg into inhaler and inhale as needed.       No Known Allergies     Follow-up Information   Follow up with Lilyan Punt, MD On 04/16/2013. (appointment at 9:50 am for evaluation of symptoms)    Specialty:  Family Medicine   Contact information:   [address] [place] [address] [phone]        The results of significant diagnostics from  this hospitalization (including imaging, microbiology, ancillary and laboratory) are listed below for reference.    Significant Diagnostic Studies: Dg Chest Port 1 View  04/05/2013   CLINICAL DATA:  Emphysema. coronary artery disease. Cough and shortness of breath.  EXAM: PORTABLE CHEST - 1 VIEW  COMPARISON:  02/24/2013 chest CT  FINDINGS: Severe emphysema. Prior CABG. Mildly enlarged cardiopericardial silhouette.  Linear subsegmental atelectasis or scarring at the right lateral lung base. Central airway thickening.  IMPRESSION: 1. Severe emphysema. 2. Mildly enlarged cardiopericardial silhouette. 3. Airway thickening is present, suggesting bronchitis or reactive airways disease. 4. Subsegmental atelectasis in the lateral basal segment right lower lobe.   Electronically Signed   By: Herbie Baltimore M.D.   On: 04/05/2013 16:07    Microbiology: No results found for this or any previous visit (from the past 240 hour(s)).   Labs: Basic Metabolic Panel:  Recent Labs Lab 04/05/13 1546 04/06/13 0551 04/09/13 0524 04/10/13 0625 04/11/13 0653  NA 134* 138 141 143 142  K 4.3 4.0 4.5 4.7 4.8  CL 93* 100 98 98 92*  CO2 30 31 42* 44* 42*  GLUCOSE 122* 194* 179* 184* 168*  BUN 12 11 10 13 13   CREATININE 0.54 0.49* 0.54 0.57 0.58  CALCIUM 10.5 9.8 10.2 10.4 10.0   Liver Function Tests:  Recent Labs Lab 04/06/13 0551  AST 18  ALT 13  ALKPHOS 68  BILITOT 0.6  PROT 6.8  ALBUMIN 3.3*   No results found for this basename: LIPASE, AMYLASE,  in the last 168 hours No results found for this basename: AMMONIA,  in the last 168 hours CBC:  Recent Labs Lab 04/05/13 1546 04/06/13 0551 04/09/13 0524  WBC 15.2* 9.0 8.5  NEUTROABS 12.6*  --   --   HGB 16.0 14.2 15.1  HCT 49.3 43.9 48.5  MCV 93.9 94.0 96.6  PLT 120* 116* 154   Cardiac Enzymes:  Recent Labs Lab 04/05/13 1546  TROPONINI <0.30   BNP: BNP (last 3 results) No results found for this basename: PROBNP,  in the  last 8760  hours CBG:  Recent Labs Lab 04/11/13 1135 04/11/13 1643 04/11/13 2130 04/12/13 0732 04/12/13 1158  GLUCAP 201* 202* 166* 134* 123*       Signed:  Tashan Kreitzer  [facility] 04/12/2013, 1:27 PM

## 2013-04-12 NOTE — Progress Notes (Signed)
Subjective: He says he is better. He has no new complaints. He is not coughing as much.  Objective: Vital signs in last 24 hours: Temp:  [97 F (36.1 C)-98.2 F (36.8 C)] 97 F (36.1 C) (12/07 0559) Pulse Rate:  [66-89] 85 (12/07 0559) Resp:  [18] 18 (12/07 0559) BP: (110-124)/(54-61) 124/54 mmHg (12/07 0559) SpO2:  [92 %-100 %] 92 % (12/07 0744) Weight change:  Last BM Date: 04/12/13  Intake/Output from previous day: 12/06 0701 - 12/07 0700 In: 720 [P.O.:720] Out: -   PHYSICAL EXAM General appearance: alert, cooperative and no distress Resp: rhonchi bilaterally Cardio: regular rate and rhythm, S1, S2 normal, no murmur, click, rub or gallop GI: soft, non-tender; bowel sounds normal; no masses,  no organomegaly Extremities: extremities normal, atraumatic, no cyanosis or edema  Lab Results:    Basic Metabolic Panel:  Recent Labs  16/10/96 0625 04/11/13 0653  NA 143 142  K 4.7 4.8  CL 98 92*  CO2 44* 42*  GLUCOSE 184* 168*  BUN 13 13  CREATININE 0.57 0.58  CALCIUM 10.4 10.0   Liver Function Tests: No results found for this basename: AST, ALT, ALKPHOS, BILITOT, PROT, ALBUMIN,  in the last 72 hours No results found for this basename: LIPASE, AMYLASE,  in the last 72 hours No results found for this basename: AMMONIA,  in the last 72 hours CBC: No results found for this basename: WBC, NEUTROABS, HGB, HCT, MCV, PLT,  in the last 72 hours Cardiac Enzymes: No results found for this basename: CKTOTAL, CKMB, CKMBINDEX, TROPONINI,  in the last 72 hours BNP: No results found for this basename: PROBNP,  in the last 72 hours D-Dimer: No results found for this basename: DDIMER,  in the last 72 hours CBG:  Recent Labs  04/10/13 2314 04/11/13 0740 04/11/13 1135 04/11/13 1643 04/11/13 2130 04/12/13 0732  GLUCAP 201* 142* 201* 202* 166* 134*   Hemoglobin A1C: No results found for this basename: HGBA1C,  in the last 72 hours Fasting Lipid Panel: No results found  for this basename: CHOL, HDL, LDLCALC, TRIG, CHOLHDL, LDLDIRECT,  in the last 72 hours Thyroid Function Tests: No results found for this basename: TSH, T4TOTAL, FREET4, T3FREE, THYROIDAB,  in the last 72 hours Anemia Panel: No results found for this basename: VITAMINB12, FOLATE, FERRITIN, TIBC, IRON, RETICCTPCT,  in the last 72 hours Coagulation: No results found for this basename: LABPROT, INR,  in the last 72 hours Urine Drug Screen: Drugs of Abuse  No results found for this basename: labopia, cocainscrnur, labbenz, amphetmu, thcu, labbarb    Alcohol Level: No results found for this basename: ETH,  in the last 72 hours Urinalysis: No results found for this basename: COLORURINE, APPERANCEUR, LABSPEC, PHURINE, GLUCOSEU, HGBUR, BILIRUBINUR, KETONESUR, PROTEINUR, UROBILINOGEN, NITRITE, LEUKOCYTESUR,  in the last 72 hours Misc. Labs:  ABGS No results found for this basename: PHART, PCO2, PO2ART, TCO2, HCO3,  in the last 72 hours CULTURES No results found for this or any previous visit (from the past 240 hour(s)). Studies/Results: No results found.  Medications:  Prior to Admission:  Prescriptions prior to admission  Medication Sig Dispense Refill  . albuterol (PROVENTIL HFA;VENTOLIN HFA) 108 (90 BASE) MCG/ACT inhaler Inhale 2 puffs into the lungs every 6 (six) hours as needed for wheezing.      Marland Kitchen albuterol (PROVENTIL) (2.5 MG/3ML) 0.083% nebulizer solution Take 2.5 mg by nebulization every 4 (four) hours as needed for wheezing or shortness of breath.      . ALPRAZolam (  XANAX) 1 MG tablet Take 0.5-1 mg by mouth 3 (three) times daily as needed for anxiety (Takes one-half tablet in the morning and afternoon. takes one tablet at bedtime daily).      Marland Kitchen aspirin 81 MG tablet Take 81 mg by mouth every morning.       . benazepril (LOTENSIN) 40 MG tablet Take 40 mg by mouth every morning.       . citalopram (CELEXA) 20 MG tablet Take 20 mg by mouth at bedtime.      . furosemide (LASIX) 20 MG  tablet Take 20 mg by mouth 2 (two) times daily.       Marland Kitchen HYDROcodone-acetaminophen (NORCO) 10-325 MG per tablet Take 1 tablet by mouth 2 (two) times daily as needed (pain).  60 tablet  0  . metoprolol (TOPROL-XL) 50 MG 24 hr tablet Take 50 mg by mouth every morning.       . nitroGLYCERIN (NITROSTAT) 0.4 MG SL tablet Place 1 tablet (0.4 mg total) under the tongue every 5 (five) minutes as needed for chest pain.  100 tablet  6  . potassium chloride SA (K-DUR,KLOR-CON) 20 MEQ tablet Take 20 mEq by mouth every morning.       . tiotropium (SPIRIVA) 18 MCG inhalation capsule Place 18 mcg into inhaler and inhale as needed.        Scheduled: . ipratropium  0.5 mg Nebulization Q4H   And  . albuterol  2.5 mg Nebulization Q4H  . budesonide-formoterol  2 puff Inhalation BID  . guaiFENesin  1,200 mg Oral BID  . heparin  5,000 Units Subcutaneous Q8H  . insulin aspart  0-20 Units Subcutaneous TID WC  . insulin aspart  0-5 Units Subcutaneous QHS  . levofloxacin  750 mg Oral Daily  . nicotine  21 mg Transdermal Daily  . predniSONE  50 mg Oral Q breakfast   Continuous: . sodium chloride     ZOX:[id number], acetaminophen, albuterol, ipratropium, ondansetron (ZOFRAN) IV, ondansetron  Assesment: He was admitted with COPD exacerbation and acute respiratory failure with hypoxia. He has slowly improved and after discussion with Dr. Kerry Hough he is likely to be discharged home today Principal Problem:   Acute respiratory failure with hypoxia Active Problems:   TOBACCO ABUSE   HYPERTENSION   COPD (chronic obstructive pulmonary disease)   Hyperlipemia   CAD (coronary artery disease)   COPD with acute exacerbation   COPD exacerbation   Hyperglycemia    Plan: I think he may need oxygen short term and probably needs to be on a pretty intensive bronchodilator treatment which also may be short term. I will plan to sign off. Thanks for allow me to see him with you    LOS: 7 days   Fiorela Pelzer  L 04/12/2013, 10:19 AM

## 2013-04-12 NOTE — Progress Notes (Signed)
Patient's IV removed. Site WNL.  AVS reviewed with patient.  Patient verbalized understanding of discharge instructions, medications, physician follow-up and home oxygen.  [facility] delivered portable oxygen tank for transport home and will deliver home tank this evening.  Prescriptions provided to patient.  Patient reports all belongings intact and in possession at time of discharge.  Patient transported by NT via w/c to main entrance for discharge.  Patient stable at time of discharge.

## 2013-04-12 NOTE — Progress Notes (Signed)
Patients IV is out of date, will leave the IV in since the patient is pending discharge in the morning.

## 2013-04-12 NOTE — Progress Notes (Signed)
Patient's O2 sats on RA 89-90% at rest.  O2 sats on RA when ambulating 85%.  O2 applied and O2 sats 91%.

## 2013-04-13 ENCOUNTER — Encounter (HOSPITAL_COMMUNITY): Admission: RE | Payer: Self-pay | Source: Ambulatory Visit

## 2013-04-13 ENCOUNTER — Ambulatory Visit (HOSPITAL_COMMUNITY): Admission: RE | Admit: 2013-04-13 | Payer: 59 | Source: Ambulatory Visit | Admitting: Internal Medicine

## 2013-04-13 SURGERY — COLONOSCOPY
Anesthesia: Moderate Sedation

## 2013-04-16 ENCOUNTER — Ambulatory Visit (INDEPENDENT_AMBULATORY_CARE_PROVIDER_SITE_OTHER): Payer: 59 | Admitting: Family Medicine

## 2013-04-16 ENCOUNTER — Encounter: Payer: Self-pay | Admitting: Family Medicine

## 2013-04-16 VITALS — BP 102/70 | Temp 97.9°F | Ht 67.0 in | Wt 165.0 lb

## 2013-04-16 DIAGNOSIS — J441 Chronic obstructive pulmonary disease with (acute) exacerbation: Secondary | ICD-10-CM

## 2013-04-16 DIAGNOSIS — R7301 Impaired fasting glucose: Secondary | ICD-10-CM

## 2013-04-16 DIAGNOSIS — R634 Abnormal weight loss: Secondary | ICD-10-CM

## 2013-04-16 DIAGNOSIS — Z23 Encounter for immunization: Secondary | ICD-10-CM

## 2013-04-16 DIAGNOSIS — J449 Chronic obstructive pulmonary disease, unspecified: Secondary | ICD-10-CM

## 2013-04-16 LAB — GLUCOSE, POCT (MANUAL RESULT ENTRY): POC Glucose: 110 mg/dl — AB (ref 70–99)

## 2013-04-16 NOTE — Progress Notes (Addendum)
   Subjective:    Patient ID: Tommy Turner, male    DOB: 1940-12-02, 72 y.o.   MRN: [id number]  HPIFollow up from hospital stay for shortness of breath. Hospital stopped blood pressure medication. He is on oxygen 2 liters and prednisone.  PMH COPD HTN Extensive records were reviewed from when he was in the hospital PMH COPD HTN  Review of Systems Denies vomiting diarrhea chest pain shortness of breath other than when he moves around    Objective:   Physical Exam  His lungs are clear hearts regular neck no masses extremities no edema skin warm dry      Assessment & Plan:  #1 COPD fairly severe continue all current medicines warning signs regarding exacerbation discuss continue oxygen for now followup 4 weeks. Possibly if he improves he may need be able to come off of this #2 tobacco abuse he did quit smoking and that the big plus #3 hypertension decent control #4 weight loss I did advise the patient to be seen for colonoscopy possible EGD with the GI #5 hyperglycemia we will follow this up again in 4-6 weeks' time we'll recheck his COPD I believe that is induced by all the steroids  This patient was seen face to face from recent hospitalization. The patient does need home health to assess pulmonary aspect and assess his medications. This patient cannot get out of the house because of hypoxemia and have any use oxygen. The diagnosis for all of this is severe COPD patient's clinical findings are above and hypoxemia patient is homebound

## 2013-04-17 DIAGNOSIS — I10 Essential (primary) hypertension: Secondary | ICD-10-CM | POA: Diagnosis not present

## 2013-04-17 DIAGNOSIS — F17200 Nicotine dependence, unspecified, uncomplicated: Secondary | ICD-10-CM | POA: Diagnosis not present

## 2013-04-17 DIAGNOSIS — I2510 Atherosclerotic heart disease of native coronary artery without angina pectoris: Secondary | ICD-10-CM | POA: Diagnosis not present

## 2013-04-17 DIAGNOSIS — J441 Chronic obstructive pulmonary disease with (acute) exacerbation: Secondary | ICD-10-CM | POA: Diagnosis not present

## 2013-04-17 DIAGNOSIS — Z9981 Dependence on supplemental oxygen: Secondary | ICD-10-CM | POA: Diagnosis not present

## 2013-04-20 DIAGNOSIS — F17200 Nicotine dependence, unspecified, uncomplicated: Secondary | ICD-10-CM | POA: Diagnosis not present

## 2013-04-20 DIAGNOSIS — Z9981 Dependence on supplemental oxygen: Secondary | ICD-10-CM | POA: Diagnosis not present

## 2013-04-20 DIAGNOSIS — I10 Essential (primary) hypertension: Secondary | ICD-10-CM | POA: Diagnosis not present

## 2013-04-20 DIAGNOSIS — I2510 Atherosclerotic heart disease of native coronary artery without angina pectoris: Secondary | ICD-10-CM | POA: Diagnosis not present

## 2013-04-20 DIAGNOSIS — J441 Chronic obstructive pulmonary disease with (acute) exacerbation: Secondary | ICD-10-CM | POA: Diagnosis not present

## 2013-04-22 ENCOUNTER — Encounter: Payer: Self-pay | Admitting: Internal Medicine

## 2013-04-23 DIAGNOSIS — J441 Chronic obstructive pulmonary disease with (acute) exacerbation: Secondary | ICD-10-CM | POA: Diagnosis not present

## 2013-04-23 DIAGNOSIS — Z9981 Dependence on supplemental oxygen: Secondary | ICD-10-CM | POA: Diagnosis not present

## 2013-04-23 DIAGNOSIS — I10 Essential (primary) hypertension: Secondary | ICD-10-CM | POA: Diagnosis not present

## 2013-04-23 DIAGNOSIS — I2510 Atherosclerotic heart disease of native coronary artery without angina pectoris: Secondary | ICD-10-CM | POA: Diagnosis not present

## 2013-04-23 DIAGNOSIS — F17200 Nicotine dependence, unspecified, uncomplicated: Secondary | ICD-10-CM | POA: Diagnosis not present

## 2013-04-28 DIAGNOSIS — J441 Chronic obstructive pulmonary disease with (acute) exacerbation: Secondary | ICD-10-CM | POA: Diagnosis not present

## 2013-04-28 DIAGNOSIS — I10 Essential (primary) hypertension: Secondary | ICD-10-CM | POA: Diagnosis not present

## 2013-04-28 DIAGNOSIS — I2510 Atherosclerotic heart disease of native coronary artery without angina pectoris: Secondary | ICD-10-CM | POA: Diagnosis not present

## 2013-04-28 DIAGNOSIS — Z9981 Dependence on supplemental oxygen: Secondary | ICD-10-CM | POA: Diagnosis not present

## 2013-04-28 DIAGNOSIS — F17200 Nicotine dependence, unspecified, uncomplicated: Secondary | ICD-10-CM | POA: Diagnosis not present

## 2013-05-01 DIAGNOSIS — I10 Essential (primary) hypertension: Secondary | ICD-10-CM

## 2013-05-01 DIAGNOSIS — F17200 Nicotine dependence, unspecified, uncomplicated: Secondary | ICD-10-CM

## 2013-05-01 DIAGNOSIS — J441 Chronic obstructive pulmonary disease with (acute) exacerbation: Secondary | ICD-10-CM

## 2013-05-01 DIAGNOSIS — Z9981 Dependence on supplemental oxygen: Secondary | ICD-10-CM

## 2013-05-01 DIAGNOSIS — I2510 Atherosclerotic heart disease of native coronary artery without angina pectoris: Secondary | ICD-10-CM

## 2013-05-04 ENCOUNTER — Other Ambulatory Visit: Payer: Self-pay | Admitting: Family Medicine

## 2013-05-08 ENCOUNTER — Other Ambulatory Visit: Payer: Self-pay | Admitting: Family Medicine

## 2013-05-22 ENCOUNTER — Encounter: Payer: Self-pay | Admitting: Family Medicine

## 2013-05-22 ENCOUNTER — Ambulatory Visit (INDEPENDENT_AMBULATORY_CARE_PROVIDER_SITE_OTHER): Payer: 59 | Admitting: Family Medicine

## 2013-05-22 VITALS — BP 136/74 | HR 71 | Ht 67.0 in | Wt 157.0 lb

## 2013-05-22 DIAGNOSIS — J449 Chronic obstructive pulmonary disease, unspecified: Secondary | ICD-10-CM

## 2013-05-22 NOTE — Progress Notes (Signed)
   Subjective:    Patient ID: Tommy Turner, male    DOB: [birth year], 73 y.o.   MRN: [id number]  HPI Patient is here today for a f/u on COPD/SOB.  He states he know longer has SOB and he is feeling well.  Patient had numerous questions about COPD numerous questions about medicine. Each medicine was went over including the importance of it in addition to this options were discussed with the patient regarding various treatments that could be done for COPD. Patient was also educated in various matters of this. He denies any fever chills hemoptysis currently denies swelling or passing out he does relate shortness of breath with activity and some coughing clear phlegm.   Review of Systems  Constitutional: Negative for activity change, appetite change and fatigue.  HENT: Negative for congestion, postnasal drip and rhinorrhea.   Respiratory: Positive for cough, shortness of breath and wheezing.   Cardiovascular: Negative for chest pain and leg swelling.  Endocrine: Negative for polydipsia and polyphagia.  Neurological: Negative for weakness.  Psychiatric/Behavioral: Negative for confusion.       Objective:   Physical Exam  Constitutional: He appears well-developed and well-nourished.  HENT:  Head: Normocephalic and atraumatic.  Right Ear: External ear normal.  Left Ear: External ear normal.  Nose: Nose normal.  Mouth/Throat: Oropharynx is clear and moist.  Neck: No thyromegaly present.  Cardiovascular: Normal rate, regular rhythm and normal heart sounds.   No murmur heard. Pulmonary/Chest: Effort normal and breath sounds normal. No respiratory distress. He has no wheezes.  Abdominal: Soft. He exhibits no distension and no mass. There is no tenderness.  Musculoskeletal: He exhibits no edema.  Lymphadenopathy:    He has no cervical adenopathy.  Neurological: He is alert. He exhibits normal muscle tone.  Skin: Skin is warm and dry. No erythema.  Psychiatric: He has a normal mood and  affect. His behavior is normal. Judgment normal.   Patient's O2 saturation resting is 94 off of oxygen it drops down to 91 when he walks a mild amount it drops down to 84 this patient does benefit and does qualify for oxygen he is to continue at 3 L       Assessment & Plan:  Severe COPD-we will continue albuterol when necessary. He is successfully quit smoking. He does need his oxygen. He will continue this. He may take breaks from it when he is totally at rest. I would recommend that he wear at nighttime. In addition to this patient is advised followup again with [place] in 3-4 months. Also use Spiriva daily. Hold off on Symbicort for current measures.  25 minutes spent discussing COPD treatments of it prevention of flareups warning signs of flareups and proper treatment of underlying hypoxia.

## 2013-05-25 ENCOUNTER — Telehealth: Payer: Self-pay | Admitting: Family Medicine

## 2013-05-25 ENCOUNTER — Ambulatory Visit: Payer: 59 | Admitting: Gastroenterology

## 2013-05-25 MED ORDER — HYDROCODONE-ACETAMINOPHEN 10-325 MG PO TABS: 1.0000 | ORAL_TABLET | Freq: Two times a day (BID) | ORAL | Status: DC | PRN

## 2013-05-25 NOTE — Telephone Encounter (Signed)
Needs refill on his hydrocodone 10/325 please call when ready

## 2013-05-25 NOTE — Telephone Encounter (Signed)
Last seen 11.6.15.

## 2013-05-25 NOTE — Telephone Encounter (Signed)
Rx printed and up front for patient pick up. Patient notified.

## 2013-05-25 NOTE — Telephone Encounter (Signed)
May give refill.

## 2013-06-11 ENCOUNTER — Encounter: Payer: Self-pay | Admitting: Family

## 2013-06-12 ENCOUNTER — Encounter: Payer: Self-pay | Admitting: Family

## 2013-06-12 ENCOUNTER — Ambulatory Visit (HOSPITAL_COMMUNITY)
Admission: RE | Admit: 2013-06-12 | Discharge: 2013-06-12 | Disposition: A | Discharge status: Home / Self Care | Payer: 59 | Source: Ambulatory Visit | Attending: Family | Admitting: Family

## 2013-06-12 ENCOUNTER — Ambulatory Visit (INDEPENDENT_AMBULATORY_CARE_PROVIDER_SITE_OTHER): Payer: 59 | Admitting: Family

## 2013-06-12 VITALS — BP 127/79 | HR 73 | Resp 16 | Ht 67.0 in | Wt 155.0 lb

## 2013-06-12 DIAGNOSIS — Z48812 Encounter for surgical aftercare following surgery on the circulatory system: Secondary | ICD-10-CM

## 2013-06-12 DIAGNOSIS — I714 Abdominal aortic aneurysm, without rupture, unspecified: Secondary | ICD-10-CM | POA: Insufficient documentation

## 2013-06-12 DIAGNOSIS — I739 Peripheral vascular disease, unspecified: Secondary | ICD-10-CM | POA: Diagnosis not present

## 2013-06-12 DIAGNOSIS — I724 Aneurysm of artery of lower extremity: Secondary | ICD-10-CM

## 2013-06-12 NOTE — Patient Instructions (Addendum)
Abdominal Aortic Aneurysm An aneurysm is a weakened or damaged part of an artery wall that bulges from the normal force of blood pumping through the body. An abdominal aortic aneurysm is an aneurysm that occurs in the lower part of the aorta, the main artery of the body.  The major concern with an abdominal aortic aneurysm is that it can enlarge and burst (rupture) or blood can flow between the layers of the wall of the aorta through a tear (aorticdissection). Both of these conditions can cause bleeding inside the body and can be life threatening unless diagnosed and treated promptly. CAUSES  The exact cause of an abdominal aortic aneurysm is unknown. Some contributing factors are:   A hardening of the arteries caused by the buildup of fat and other substances in the lining of a blood vessel (arteriosclerosis).  Inflammation of the walls of an artery (arteritis).   Connective tissue diseases, such as Marfan syndrome.   Abdominal trauma.   An infection, such as syphilis or staphylococcus, in the wall of the aorta (infectious aortitis) caused by bacteria. RISK FACTORS  Risk factors that contribute to an abdominal aortic aneurysm may include:  Age older than 60 years.   High blood pressure (hypertension).  Male gender.  Ethnicity (white race).  Obesity.  Family history of aneurysm (first degree relatives only).  Tobacco use. PREVENTION  The following healthy lifestyle habits may help decrease your risk of abdominal aortic aneurysm:  Quitting smoking. Smoking can raise your blood pressure and cause arteriosclerosis.  Limiting or avoiding alcohol.  Keeping your blood pressure, blood sugar level, and cholesterol levels within normal limits.  Decreasing your salt intake. In somepeople, too much salt can raise blood pressure and increase your risk of abdominal aortic aneurysm.  Eating a diet low in saturated fats and cholesterol.  Increasing your fiber intake by including  whole grains, vegetables, and fruits in your diet. Eating these foods may help lower blood pressure.  Maintaining a healthy weight.  Staying physically active and exercising regularly. SYMPTOMS  The symptoms of abdominal aortic aneurysm may vary depending on the size and rate of growth of the aneurysm.Most grow slowly and do not have any symptoms. When symptoms do occur, they may include:  Pain (abdomen, side, lower back, or groin). The pain may vary in intensity. A sudden onset of severe pain may indicate that the aneurysm has ruptured.  Feeling full after eating only small amounts of food.  Nausea or vomiting or both.  Feeling a pulsating lump in the abdomen.  Feeling faint or passing out. DIAGNOSIS  Since most unruptured abdominal aortic aneurysms have no symptoms, they are often discovered during diagnostic exams for other conditions. An aneurysm may be found during the following procedures:  Ultrasonography (A one-time screening for abdominal aortic aneurysm by ultrasonography is also recommended for all men aged 65-75 years who have ever smoked).  X-ray exams.  A computed tomography (CT).  Magnetic resonance imaging (MRI).  Angiography or arteriography. TREATMENT  Treatment of an abdominal aortic aneurysm depends on the size of your aneurysm, your age, and risk factors for rupture. Medication to control blood pressure and pain may be used to manage aneurysms smaller than 6 cm. Regular monitoring for enlargement may be recommended by your caregiver if:  The aneurysm is 3 4 cm in size (an annual ultrasonography may be recommended).  The aneurysm is 4 4.5 cm in size (an ultrasonography every 6 months may be recommended).  The aneurysm is larger than 4.5   cm in size (your caregiver may ask that you be examined by a vascular surgeon). If your aneurysm is larger than 6 cm, surgical repair may be recommended. There are two main methods for repair of an aneurysm:   Endovascular  repair (a minimally invasive surgery). This is done most often.  Open repair. This method is used if an endovascular repair is not possible. Document Released: 01/31/2005 Document Revised: 08/18/2012 Document Reviewed: 05/23/2012 [facility] Patient Information 2014 [place], [place].   Nicotine skin patches What is this medicine? NICOTINE ([place]) helps people stop smoking. The patches replace the nicotine found in cigarettes and help to decrease withdrawal effects. They are most effective when used in combination with a stop-smoking program. This medicine may be used for other purposes; ask your health care provider or pharmacist if you have questions. COMMON BRAND NAME(S): Habitrol, Nicoderm CQ, Nicotrol What should I tell my health care provider before I take this medicine? They need to know if you have any of these conditions: -diabetes -heart disease, angina, irregular heartbeat or previous heart attack -lung disease, including asthma -overactive thyroid -pheochromocytoma -skin problems -stomach problems or ulcers -an unusual or allergic reaction to nicotine, adhesives, other medicines, foods, dyes, or preservatives -pregnant or trying to get pregnant -breast-feeding How should I use this medicine? This medicine is for use on the skin. Follow the directions that come with the patches. Find an area of skin on your upper arm, chest, or back that is clean, dry, greaseless, undamaged and hairless. Wash hands with plain soap and water. Do not use anything that contains aloe, lanolin or glycerin as these may prevent the patch from sticking. Dry thoroughly. Remove the patch from the sealed pouch. Do not try to cut or trim the patch. Using your palm, press the patch firmly in place for 10 seconds to make sure that there is good contact with your skin. After applying the patch, wash your hands. Change the patch every day, keeping to a regular schedule. When you apply a new patch, use a new area  of skin. Wait at least 1 week before using the same area again. Talk to your pediatrician regarding the use of this medicine in children. Special care may be needed. Overdosage: If you think you have taken too much of this medicine contact a poison control center or emergency room at once. NOTE: This medicine is only for you. Do not share this medicine with others. What if I miss a dose? If you forget to replace a patch, use it as soon as you can. Only use one patch at a time and do not leave on the skin for longer than directed. If a patch falls off, you can replace it, but keep to your schedule and remove the patch at the right time. What may interact with this medicine? -medicines for asthma -medicines for blood pressure -medicines for mental depression This list may not describe all possible interactions. Give your health care provider a list of all the medicines, herbs, non-prescription drugs, or dietary supplements you use. Also tell them if you smoke, drink alcohol, or use illegal drugs. Some items may interact with your medicine. What should I watch for while using this medicine? Do not smoke, chew nicotine gum, or use snuff while you are using this medicine. This reduces the chance of a nicotine overdose. You can keep the patch in place during swimming, bathing, and showering. If your patch falls off during these activities, replace it. When you first  apply the patch, your skin may itch or burn. This should soon go away. When you remove a patch, the skin may look red, but this should only last for a day. Call your doctor or health care professional if you get a permanent skin rash. If you are a diabetic and you quit smoking, the effects of insulin may be increased and you may need to reduce your insulin dose. Check with your doctor or health care professional about how you should adjust your insulin dose. If you are going to have a magnetic resonance imaging (MRI) procedure, tell your MRI  technician if you have this patch on your body. It must be removed before a MRI. What side effects may I notice from receiving this medicine? Side effects that you should report to your doctor or health care professional as soon as possible: -allergic reactions like skin rash, itching or hives, swelling of the face, lips, or tongue -breathing problems -changes in hearing -changes in vision -chest pain -cold sweats -confusion -fast, irregular heartbeat -feeling faint or lightheaded, falls -headache -increased saliva -nausea, vomiting -skin redness that lasts more than 4 days -stomach pain -weakness Side effects that usually do not require medical attention (report to your doctor or health care professional if they continue or are bothersome): -diarrhea -dry mouth -hiccups -irritability -nervousness or restlessness -trouble sleeping or vivid dreams This list may not describe all possible side effects. Call your doctor for medical advice about side effects. You may report side effects to FDA at 1-800-FDA-1088. Where should I keep my medicine? Keep out of the reach of children. Store at room temperature between 20 and 25 degrees C (68 and 77 degrees F). Protect from heat and light. Store in International aid/development worker until ready to use. Throw away unused medicine after the expiration date. When you remove a patch, fold with sticky sides together; put in an empty opened pouch and throw away. NOTE: This sheet is a summary. It may not cover all possible information. If you have questions about this medicine, talk to your doctor, pharmacist, or health care provider.  2014, Elsevier/Gold Standard. (2010-06-27 13:06:00)

## 2013-06-12 NOTE — Addendum Note (Signed)
Addended by: Dorthula Rue L on: 06/12/2013 04:19 PM   Modules accepted: Orders

## 2013-06-12 NOTE — Progress Notes (Signed)
[facility] [place]  Established Abdominal Aortic Aneurysm  History of Present Illness  Tommy Turner is a 73 y.o. ([birth year]) male patient of Dr. Bridgett Larsson who is being followed for asymptomatic AAA with stable size, stable L CIA aneurysm, stable B popliteal ectasias to this point. He returns today for follow up. Previous studies demonstrate an AAA, measuring 4.7 cm.  The patient does not have back or abdominal pain.  The patient is a former smoker. The patient denies claudication in legs with walking. The patient denies history of stroke or TIA symptoms. He takes daily ASA and pravastatin.  Pt Diabetic: "up a little bit, not on medicine for it" Pt smoker: former smoker, quit 3 months ago, using nicotine patches  Past Medical History  Diagnosis Date  . AAA (abdominal aortic aneurysm)   . Cough   . Shortness of breath   . COPD (chronic obstructive pulmonary disease)   . Hyperlipidemia   . CAD (coronary artery disease)   . Hypertension   . Ulcer     gastric  . Aneurysm   . Prediabetes    Past Surgical History  Procedure Laterality Date  . Lipoma excision      right lower back  . Partial gastrectomy      approx 1990  . Cardiac surgery     Social History History   Social History  . Marital Status: Married    Spouse Name: N/A    Number of Children: N/A  . Years of Education: N/A   Occupational History  . Not on file.   Social History Main Topics  . Smoking status: Former Smoker -- 0.50 packs/day for 55 years    Types: Cigarettes    Quit date: 03/22/2013  . Smokeless tobacco: Never Used  . Alcohol Use: No  . Drug Use: No  . Sexual Activity: Not on file   Other Topics Concern  . Not on file   Social History Narrative  . No narrative on file   Family History Family History  Problem Relation Age of Onset  . Hypertension Brother   . Heart disease Brother   . Cancer Mother   . Cancer Father     prostate  . COPD Sister   . Colon cancer  Neg Hx     Current Outpatient Prescriptions on File Prior to Visit  Medication Sig Dispense Refill  . albuterol (PROVENTIL HFA;VENTOLIN HFA) 108 (90 BASE) MCG/ACT inhaler Inhale 2 puffs into the lungs every 6 (six) hours as needed for wheezing.      Marland Kitchen albuterol (PROVENTIL) (2.5 MG/3ML) 0.083% nebulizer solution Take 2.5 mg by nebulization every 4 (four) hours as needed for wheezing or shortness of breath.      . ALPRAZolam (XANAX) 1 MG tablet Take 0.5-1 mg by mouth 3 (three) times daily as needed for anxiety (Takes one-half tablet in the morning and afternoon. takes one tablet at bedtime daily).      Marland Kitchen aspirin 81 MG tablet Take 81 mg by mouth every morning.       . benazepril (LOTENSIN) 40 MG tablet TAKE 1 TABLET BY MOUTH DAILY  120 tablet  5  . budesonide-formoterol (SYMBICORT) 160-4.5 MCG/ACT inhaler Inhale 2 puffs into the lungs 2 (two) times daily.  1 Inhaler  0  . citalopram (CELEXA) 20 MG tablet Take 20 mg by mouth at bedtime.      . furosemide (LASIX) 20 MG tablet TAKE 1 TABLET BY MOUTH EVERY MORNING  90 tablet  1  . HYDROcodone-acetaminophen (NORCO) 10-325 MG per tablet Take 1 tablet by mouth 2 (two) times daily as needed (pain).  60 tablet  0  . nicotine (NICODERM CQ - DOSED IN MG/24 HOURS) 21 mg/24hr patch Place 1 patch (21 mg total) onto the skin daily.  28 patch  0  . nitroGLYCERIN (NITROSTAT) 0.4 MG SL tablet Place 1 tablet (0.4 mg total) under the tongue every 5 (five) minutes as needed for chest pain.  100 tablet  6  . potassium chloride SA (K-DUR,KLOR-CON) 20 MEQ tablet Take 20 mEq by mouth every morning.       . tiotropium (SPIRIVA) 18 MCG inhalation capsule Place 18 mcg into inhaler and inhale as needed.       Marland Kitchen UNABLE TO FIND Oxygen 2 liters.       No current facility-administered medications on file prior to visit.   No Known Allergies  ROS: See HPI for pertinent positives and negatives.  Physical Examination  Filed Vitals:   06/12/13 0911  BP: 127/79  Pulse: 73   Resp: 16   Filed Weights   06/12/13 0911  Weight: 155 lb (70.308 kg)   Body mass index is 24.27 kg/(m^2).  General: A&O x 3, WD.  Pulmonary: Sym exp, good air movt, CTAB, no rales, rhonchi, or wheezing.   Cardiac: RRR, Nl S1, S2, no Murmur detected.  Carotid Bruits Left Right   Negative Negative   Aorta is not palpable Radial pulses are 2+ and =                          VASCULAR EXAM:                                                                                                         LE Pulses LEFT RIGHT       FEMORAL   palpable   palpable        POPLITEAL  not palpable   not palpable       POSTERIOR TIBIAL   palpable    palpable        DORSALIS PEDIS      ANTERIOR TIBIAL  palpable   palpable      Gastrointestinal: soft, NTND, -G/R, - HSM, - masses, - CVAT B.  Musculoskeletal: M/S 5/5 throughout, Extremities without ischemic changes.   Neurologic: CN 2-12 intact, Pain and light touch intact in extremities, Motor exam as listed above.  Non-Invasive Vascular Imaging  AAA Duplex (06/12/2013)  Previous size: 4.7 cm (Date: 12/05/12) RCIA: 0.83, LCIA: 1.7 cm  Current size:  5.0 cm (Date: 06/12/13), RCIA: 0.77, LCIA: 0.92 cm  Medical Decision Making  The patient is a 73 y.o. male who presents with asymptomatic AAA with slight increase in size.   Based on this patient's exam and diagnostic studies, and after discussing with Dr. Bridgett Larsson, the patient will follow up in 6 months  with the following studies: AAA Duplex and see Dr. Lowanda Foster.  The threshold for repair is AAA size > 5.5 cm,  growth > 1 cm/yr, and symptomatic status.  I emphasized the importance of maximal medical management including strict control of blood pressure, blood glucose, and lipid levels, antiplatelet agents, obtaining regular exercise, and cessation of smoking.   The patient was given information about AAA including signs, symptoms, treatment, and how to minimize the risk of enlargement and rupture of  aneurysms.    The patient was advised to call 911 should the patient experience sudden onset abdominal or back pain.   Thank you for allowing [place] to participate in this patient's care.  Clemon Chambers, RN, MSN, FNP-C [facility] [place] Office: [phone]  Clinic Physician: Bridgett Larsson  06/12/2013, 9:07 AM

## 2013-06-22 ENCOUNTER — Telehealth: Payer: Self-pay | Admitting: Family Medicine

## 2013-06-22 MED ORDER — FUROSEMIDE 20 MG PO TABS: 20.0000 mg | ORAL_TABLET | Freq: Two times a day (BID) | ORAL | Status: DC

## 2013-06-22 NOTE — Telephone Encounter (Signed)
Please verify his dose then go ahead and order the new dosage for him locally 30 days.

## 2013-06-22 NOTE — Telephone Encounter (Signed)
Pt was in hospital where they upped his dose on his   furosemide (LASIX) 20 MG tablet   He is out now, can't get through out patient pharm an wants to know if she can get a 30 day supply  For [facility], then she will get the reg 90 sent back to Outpatient pharmacy after the 30 days

## 2013-06-22 NOTE — Telephone Encounter (Signed)
Lasix 20mg  bid. Rx sent electronically to [facility]. Patient notified.

## 2013-06-23 ENCOUNTER — Other Ambulatory Visit: Payer: Self-pay | Admitting: Vascular Surgery

## 2013-06-23 DIAGNOSIS — I999 Unspecified disorder of circulatory system: Secondary | ICD-10-CM

## 2013-06-23 DIAGNOSIS — I789 Disease of capillaries, unspecified: Secondary | ICD-10-CM

## 2013-07-01 ENCOUNTER — Telehealth: Payer: Self-pay | Admitting: Family Medicine

## 2013-07-01 MED ORDER — HYDROCODONE-ACETAMINOPHEN 10-325 MG PO TABS: 1.0000 | ORAL_TABLET | Freq: Two times a day (BID) | ORAL | Status: DC | PRN

## 2013-07-01 MED ORDER — TIOTROPIUM BROMIDE MONOHYDRATE 18 MCG IN CAPS: 18.0000 ug | ORAL_CAPSULE | Freq: Every day | RESPIRATORY_TRACT | Status: DC

## 2013-07-01 NOTE — Telephone Encounter (Signed)
Last seen 11.6.15.

## 2013-07-01 NOTE — Telephone Encounter (Signed)
Spiriva was sent to pharmacy. Hydrocodone script ready for pickup. Wife was notified.

## 2013-07-01 NOTE — Telephone Encounter (Signed)
Patient needs Rx for hydrocodone and spiriva to [facility]

## 2013-07-01 NOTE — Telephone Encounter (Signed)
Spiriva may have 90 day and 3 refills, hydrocodone #60

## 2013-07-13 ENCOUNTER — Other Ambulatory Visit: Payer: Self-pay | Admitting: Family Medicine

## 2013-07-20 ENCOUNTER — Other Ambulatory Visit: Payer: Self-pay | Admitting: Family Medicine

## 2013-08-17 ENCOUNTER — Other Ambulatory Visit: Payer: Self-pay | Admitting: Family Medicine

## 2013-08-18 ENCOUNTER — Telehealth: Payer: Self-pay | Admitting: Family Medicine

## 2013-08-18 MED ORDER — HYDROCODONE-ACETAMINOPHEN 10-325 MG PO TABS: 1.0000 | ORAL_TABLET | Freq: Two times a day (BID) | ORAL | Status: DC | PRN

## 2013-08-18 NOTE — Telephone Encounter (Signed)
Last seen 11.6.15.

## 2013-08-18 NOTE — Telephone Encounter (Signed)
Patient needs Rx for hydrocodone. °

## 2013-08-18 NOTE — Telephone Encounter (Signed)
May have a prescription, also need to go ahead and schedule an office visit within the next 30 days.

## 2013-08-21 ENCOUNTER — Ambulatory Visit (INDEPENDENT_AMBULATORY_CARE_PROVIDER_SITE_OTHER): Payer: 59 | Admitting: Family Medicine

## 2013-08-21 ENCOUNTER — Encounter: Payer: Self-pay | Admitting: Family Medicine

## 2013-08-21 VITALS — BP 120/70 | Ht 67.0 in | Wt 162.0 lb

## 2013-08-21 DIAGNOSIS — I10 Essential (primary) hypertension: Secondary | ICD-10-CM

## 2013-08-21 DIAGNOSIS — J449 Chronic obstructive pulmonary disease, unspecified: Secondary | ICD-10-CM

## 2013-08-21 NOTE — Progress Notes (Signed)
   Subjective:    Patient ID: Tommy Turner, male    DOB: [birth year], 72 y.o.   MRN: [id number]  Hypertension This is a chronic problem. The current episode started more than 1 year ago. The problem has been gradually improving since onset. The problem is controlled. There are no associated agents to hypertension. There are no known risk factors for coronary artery disease. Treatments tried: benazepril. The current treatment provides significant improvement. There are no compliance problems.   Patient states he has no concerns at this time.  Patient also has COPD currently right now does not think he needs the oxygen. He needed it when he was in the hospital. He relates now his breathing is the same he states he's not smoking but occasionally he admits to an occasional cigarette. He denies fevers chills chest pain nausea vomiting   Review of Systems See above    Objective:   Physical Exam Heart regular pulse normal lungs clear abdomen soft, he is thin O2 sat on room air 97%  Has abnormal mole on the right flank region will need removal    Assessment & Plan:  COPD-stable continue current measures stay away from all smoking HTN good control continue current measures Hypoxia-resolved no further oxygen necessary currently he states for right now he will keep it but if his O2 sat stayed good he will have the home health to take it back Atypical mole on his right posterior back region he will need a punch biopsy done in the near future he'll set this up for next week

## 2013-08-27 ENCOUNTER — Ambulatory Visit (INDEPENDENT_AMBULATORY_CARE_PROVIDER_SITE_OTHER): Payer: 59 | Admitting: Family Medicine

## 2013-08-27 VITALS — Ht 67.0 in | Wt 162.0 lb

## 2013-08-27 DIAGNOSIS — D485 Neoplasm of uncertain behavior of skin: Secondary | ICD-10-CM | POA: Diagnosis not present

## 2013-08-27 DIAGNOSIS — L989 Disorder of the skin and subcutaneous tissue, unspecified: Secondary | ICD-10-CM

## 2013-08-27 NOTE — Progress Notes (Signed)
   Subjective:    Patient ID: Tommy Turner, male    DOB: December 02, 1940, 73 y.o.   MRN: [id number]  HPI  Patient arrives for a mole removal from back. On recent exam abnormal mole was spotted he is here today to have it removed procedure was explained patient consents Review of Systems     Objective:   Physical Exam  Atypical mole on his right mid back but no other moles noted      Assessment & Plan:  Patient has an abnormal mole on his right mid back this was removed with punch biopsy 2 sutures placed without difficulty wound precautions given followup 7 days for suture removal overweight pathology

## 2013-09-03 ENCOUNTER — Ambulatory Visit: Payer: 59 | Admitting: Family Medicine

## 2013-09-03 ENCOUNTER — Encounter: Payer: Self-pay | Admitting: Family Medicine

## 2013-09-03 VITALS — BP 118/78 | Ht 67.0 in | Wt 158.0 lb

## 2013-09-03 DIAGNOSIS — L989 Disorder of the skin and subcutaneous tissue, unspecified: Secondary | ICD-10-CM

## 2013-09-03 NOTE — Progress Notes (Signed)
   Subjective:    Patient ID: Tommy Turner, male    DOB: December 28, 1940, 73 y.o.   MRN: [id number]  HPISuture removal. Patient states no other concerns.   Patient relates everything else is doing well he relates no soreness redness or pain from the biopsy. The results of the biopsy was discussed it is benign no sign of cancer no need for reexcision  Review of Systems     Objective:   Physical Exam  The area where the punch biopsy was done has 2 sutures there is no other abnormal moles around it no sign of any infection      Assessment & Plan:  Sutures removed without difficulty. I find no evidence of any complication. Patient was told that he needs to have this area checked yearly and we will relook at this area when he follows up at his next checkup. He needs followup in June for that.

## 2013-09-03 NOTE — Patient Instructions (Signed)
Need to do exam of skin yearly  Next check June   No cancer

## 2013-09-25 ENCOUNTER — Encounter: Payer: Self-pay | Admitting: Family Medicine

## 2013-10-01 ENCOUNTER — Other Ambulatory Visit: Payer: Self-pay | Admitting: Family Medicine

## 2013-10-05 ENCOUNTER — Telehealth: Payer: Self-pay | Admitting: Family Medicine

## 2013-10-05 MED ORDER — HYDROCODONE-ACETAMINOPHEN 10-325 MG PO TABS: 1.0000 | ORAL_TABLET | Freq: Two times a day (BID) | ORAL | Status: DC | PRN

## 2013-10-05 NOTE — Telephone Encounter (Signed)
Wife notified that script is ready for pickup.

## 2013-10-05 NOTE — Telephone Encounter (Signed)
He may have prescription for hydrocodone. He should followup in July August at the latest

## 2013-10-05 NOTE — Telephone Encounter (Signed)
Patient needs Rx for hydrocodone. °

## 2013-10-05 NOTE — Telephone Encounter (Signed)
Last seen 09/03/13 (sick)

## 2013-10-12 ENCOUNTER — Telehealth: Payer: Self-pay | Admitting: Family Medicine

## 2013-10-12 NOTE — Telephone Encounter (Signed)
Patient has been using his oxygen and inhaler more now and patients wife would like to know if we need to check patients CO2 levels?

## 2013-10-12 NOTE — Telephone Encounter (Signed)
Please find out what level of oxygen the patient is using. He should continue this level and get a ABG checked at the hospital on this level of oxygen. Please call family order test and document thank you

## 2013-10-13 NOTE — Telephone Encounter (Signed)
Left message to return call 

## 2013-10-13 NOTE — Telephone Encounter (Signed)
Patient not using the oxygen all the time- if he gets wore out or short winded esp working outside in the heat- he will have to come in and set to 2 liters and wears it till he catches his breath again. He is having to do this more since the weather has gotten hot

## 2013-10-13 NOTE — Telephone Encounter (Signed)
Patient's wife notified and verbalized understanding.  

## 2013-10-13 NOTE — Telephone Encounter (Signed)
Given this information I was not ABGs. He may use up to 2 L of oxygen when necessary., He should followup here at least every 3 months, if he has ongoing troubles or problems followup sooner. Certainly if wife feels he is getting worse I am more than happy to see him anytime in the near future.

## 2013-10-27 ENCOUNTER — Ambulatory Visit (INDEPENDENT_AMBULATORY_CARE_PROVIDER_SITE_OTHER): Payer: 59 | Admitting: Family Medicine

## 2013-10-27 ENCOUNTER — Telehealth: Payer: Self-pay | Admitting: Family Medicine

## 2013-10-27 ENCOUNTER — Encounter: Payer: Self-pay | Admitting: Family Medicine

## 2013-10-27 VITALS — BP 130/82 | Ht 67.0 in | Wt 152.6 lb

## 2013-10-27 DIAGNOSIS — G894 Chronic pain syndrome: Secondary | ICD-10-CM

## 2013-10-27 DIAGNOSIS — J449 Chronic obstructive pulmonary disease, unspecified: Secondary | ICD-10-CM

## 2013-10-27 DIAGNOSIS — J4489 Other specified chronic obstructive pulmonary disease: Secondary | ICD-10-CM

## 2013-10-27 DIAGNOSIS — I10 Essential (primary) hypertension: Secondary | ICD-10-CM

## 2013-10-27 DIAGNOSIS — E785 Hyperlipidemia, unspecified: Secondary | ICD-10-CM

## 2013-10-27 MED ORDER — HYDROCODONE-ACETAMINOPHEN 10-325 MG PO TABS: 1.0000 | ORAL_TABLET | Freq: Two times a day (BID) | ORAL | Status: DC | PRN

## 2013-10-27 MED ORDER — NITROGLYCERIN 0.4 MG SL SUBL: 0.4000 mg | SUBLINGUAL_TABLET | SUBLINGUAL | Status: DC | PRN

## 2013-10-27 NOTE — Telephone Encounter (Signed)
Patients wife notified

## 2013-10-27 NOTE — Progress Notes (Signed)
   Subjective:    Patient ID: Tommy Turner, male    DOB: 14-May-1940, 73 y.o.   MRN: [id number]  Hypertension This is a chronic problem. The current episode started more than 1 year ago. Associated symptoms include shortness of breath. Pertinent negatives include no chest pain. Risk factors for coronary artery disease include male gender. Treatments tried: benazepril, lasix. There are no compliance problems.    Patient overall doing fair with his breathing gets short of breath with activity. He relates no chest congestion no fevers. No vomiting.  Patient with chronic pain and uses hydrocodone for orthopedic pain uses anywhere between 2 and 3 per day typically 2 or less per day denies any particular problems with it denies abusing it chronic pain-3 prescriptions given followup if ongoing troubles  COPD stable continue current measures  Pedal edema minimal continue Lasix  HTN good control overall   Review of Systems  Constitutional: Negative for activity change, appetite change and fatigue.  HENT: Negative for congestion.   Respiratory: Positive for cough and shortness of breath.        No hemoptysis, has copd  Cardiovascular: Negative for chest pain.  Gastrointestinal: Negative for abdominal pain.  Endocrine: Negative for polydipsia and polyphagia.  Neurological: Negative for weakness.  Psychiatric/Behavioral: Negative for confusion.       Objective:   Physical Exam  Vitals reviewed. Constitutional: He appears well-nourished. No distress.  Cardiovascular: Normal rate, regular rhythm and normal heart sounds.   No murmur heard. Pulmonary/Chest: Effort normal and breath sounds normal. No respiratory distress.  Musculoskeletal: He exhibits no edema.  Lymphadenopathy:    He has no cervical adenopathy.  Neurological: He is alert.  Psychiatric: His behavior is normal.          Assessment & Plan:  HTN COPD Pedal edema Chronic pain Please see above. Machine dictated placed  above for some reason. Followup 3 months 25 minutes spent in patient greater than half in discussion

## 2013-10-27 NOTE — Telephone Encounter (Signed)
Patients wife said she was to call back and give status about patients nitroGLYCERIN (NITROSTAT) 0.4 MG SL tablet. They are over a year old and he needs a new prescription to [facility].

## 2013-11-12 ENCOUNTER — Other Ambulatory Visit: Payer: Self-pay | Admitting: *Deleted

## 2013-11-12 DIAGNOSIS — Z125 Encounter for screening for malignant neoplasm of prostate: Secondary | ICD-10-CM

## 2013-11-12 DIAGNOSIS — R7303 Prediabetes: Secondary | ICD-10-CM

## 2013-11-12 DIAGNOSIS — E782 Mixed hyperlipidemia: Secondary | ICD-10-CM

## 2013-11-12 DIAGNOSIS — Z79899 Other long term (current) drug therapy: Secondary | ICD-10-CM

## 2013-11-12 DIAGNOSIS — R972 Elevated prostate specific antigen [PSA]: Secondary | ICD-10-CM

## 2013-11-12 DIAGNOSIS — E119 Type 2 diabetes mellitus without complications: Secondary | ICD-10-CM

## 2013-11-12 LAB — CBC WITH DIFFERENTIAL/PLATELET
BASOS ABS: 0 10*3/uL (ref 0.0–0.1)
BASOS PCT: 0 % (ref 0–1)
EOS ABS: 0.1 10*3/uL (ref 0.0–0.7)
EOS PCT: 1 % (ref 0–5)
HCT: 47.6 % (ref 39.0–52.0)
Hemoglobin: 15.9 g/dL (ref 13.0–17.0)
Lymphocytes Relative: 17 % (ref 12–46)
Lymphs Abs: 1.2 10*3/uL (ref 0.7–4.0)
MCH: 29.3 pg (ref 26.0–34.0)
MCHC: 33.4 g/dL (ref 30.0–36.0)
MCV: 87.8 fL (ref 78.0–100.0)
Monocytes Absolute: 0.7 10*3/uL (ref 0.1–1.0)
Monocytes Relative: 10 % (ref 3–12)
Neutro Abs: 5.2 10*3/uL (ref 1.7–7.7)
Neutrophils Relative %: 72 % (ref 43–77)
PLATELETS: 140 10*3/uL — AB (ref 150–400)
RBC: 5.42 MIL/uL (ref 4.22–5.81)
RDW: 14.5 % (ref 11.5–15.5)
WBC: 7.2 10*3/uL (ref 4.0–10.5)

## 2013-11-12 LAB — HEMOGLOBIN A1C
HEMOGLOBIN A1C: 5.8 % — AB (ref <5.7)
Mean Plasma Glucose: 120 mg/dL — ABNORMAL HIGH (ref <117)

## 2013-11-12 LAB — HEPATIC FUNCTION PANEL
ALBUMIN: 4.3 g/dL (ref 3.5–5.2)
ALT: 9 U/L (ref 0–53)
AST: 15 U/L (ref 0–37)
Alkaline Phosphatase: 84 U/L (ref 39–117)
BILIRUBIN INDIRECT: 0.4 mg/dL (ref 0.2–1.2)
BILIRUBIN TOTAL: 0.6 mg/dL (ref 0.2–1.2)
Bilirubin, Direct: 0.2 mg/dL (ref 0.0–0.3)
Total Protein: 6.4 g/dL (ref 6.0–8.3)

## 2013-11-12 LAB — BASIC METABOLIC PANEL
BUN: 11 mg/dL (ref 6–23)
CO2: 32 mEq/L (ref 19–32)
Calcium: 9.5 mg/dL (ref 8.4–10.5)
Chloride: 99 mEq/L (ref 96–112)
Creat: 0.56 mg/dL (ref 0.50–1.35)
Glucose, Bld: 106 mg/dL — ABNORMAL HIGH (ref 70–99)
Potassium: 4.7 mEq/L (ref 3.5–5.3)
Sodium: 139 mEq/L (ref 135–145)

## 2013-11-12 LAB — LIPID PANEL
Cholesterol: 141 mg/dL (ref 0–200)
HDL: 47 mg/dL (ref >39)
LDL CALC: 84 mg/dL (ref 0–99)
Total CHOL/HDL Ratio: 3 Ratio
Triglycerides: 51 mg/dL (ref <150)
VLDL: 10 mg/dL (ref 0–40)

## 2013-11-13 LAB — PSA, MEDICARE: PSA: 4.38 ng/mL — ABNORMAL HIGH (ref <=4.00)

## 2013-11-19 ENCOUNTER — Other Ambulatory Visit: Payer: Self-pay | Admitting: *Deleted

## 2013-11-19 DIAGNOSIS — R972 Elevated prostate specific antigen [PSA]: Secondary | ICD-10-CM

## 2013-11-19 NOTE — Addendum Note (Signed)
Addended byCharolotte Capuchin D on: 11/19/2013 10:29 AM   Modules accepted: Orders

## 2013-11-23 ENCOUNTER — Other Ambulatory Visit: Payer: Self-pay | Admitting: Family Medicine

## 2013-12-04 ENCOUNTER — Encounter: Payer: Self-pay | Admitting: Family

## 2013-12-07 ENCOUNTER — Other Ambulatory Visit (HOSPITAL_COMMUNITY): Payer: 59

## 2013-12-07 ENCOUNTER — Ambulatory Visit: Payer: 59 | Admitting: Family

## 2013-12-07 ENCOUNTER — Ambulatory Visit (INDEPENDENT_AMBULATORY_CARE_PROVIDER_SITE_OTHER): Payer: 59 | Admitting: Family

## 2013-12-07 ENCOUNTER — Ambulatory Visit (HOSPITAL_COMMUNITY)
Admission: RE | Admit: 2013-12-07 | Discharge: 2013-12-07 | Disposition: A | Discharge status: Home / Self Care | Payer: 59 | Source: Ambulatory Visit | Attending: Family | Admitting: Family

## 2013-12-07 ENCOUNTER — Other Ambulatory Visit: Payer: Self-pay | Admitting: Family

## 2013-12-07 ENCOUNTER — Encounter: Payer: Self-pay | Admitting: Family

## 2013-12-07 ENCOUNTER — Ambulatory Visit (INDEPENDENT_AMBULATORY_CARE_PROVIDER_SITE_OTHER)
Admission: RE | Admit: 2013-12-07 | Discharge: 2013-12-07 | Disposition: A | Discharge status: Home / Self Care | Payer: 59 | Source: Ambulatory Visit

## 2013-12-07 VITALS — BP 107/64 | HR 56 | Resp 16 | Ht 67.0 in | Wt 147.0 lb

## 2013-12-07 DIAGNOSIS — Z48812 Encounter for surgical aftercare following surgery on the circulatory system: Secondary | ICD-10-CM

## 2013-12-07 DIAGNOSIS — I999 Unspecified disorder of circulatory system: Secondary | ICD-10-CM

## 2013-12-07 DIAGNOSIS — I723 Aneurysm of iliac artery: Secondary | ICD-10-CM

## 2013-12-07 DIAGNOSIS — I714 Abdominal aortic aneurysm, without rupture, unspecified: Secondary | ICD-10-CM

## 2013-12-07 DIAGNOSIS — Z01810 Encounter for preprocedural cardiovascular examination: Secondary | ICD-10-CM | POA: Diagnosis not present

## 2013-12-07 DIAGNOSIS — I724 Aneurysm of artery of lower extremity: Secondary | ICD-10-CM

## 2013-12-07 DIAGNOSIS — I789 Disease of capillaries, unspecified: Secondary | ICD-10-CM | POA: Diagnosis not present

## 2013-12-07 DIAGNOSIS — I739 Peripheral vascular disease, unspecified: Secondary | ICD-10-CM

## 2013-12-07 NOTE — Progress Notes (Signed)
[facility]  Established Abdominal Aortic Aneurysm  History of Present Illness  Tommy Turner is a 73 y.o. ([birth year]) male patient of Dr. Bridgett Larsson who is being followed for asymptomatic AAA with stable size, stable L CIA aneurysm, stable B popliteal ectasias to this point.  He returns today for follow up.  Previous studies demonstrate an AAA, measuring 5.0 cm.  His last CT of abd/pelvis was in August, 2012, results as follows: Length of infrarenal neck (from lowest renal artery): 40 mm  Number of renal arteries: Right = 1; Left = 1  Diameter of infrarenal neck: 29 mm  Total length of aneurysm: 80 mm  Aneurysm ends at aortic bifurcation: Y  Greatest aneurysm diameter: 42 mm  Greatest common iliac artery diameters: Right =16 mm; Left = 18 mm  Diameter of common iliac arteries just above iliac bifurcation:  Right = 15 mm; Left = 14 mm  Length of common iliac arteries: Right = 34 mm; Left = 26 mm   The patient does not have back or abdominal pain. The patient has resumed smoking. The patient denies claudication in legs with walking.  The patient denies history of stroke or TIA symptoms.  He takes daily ASA and pravastatin.   Pt Diabetic: "up a little bit, not on medicine for it"  Pt smoker: he resumed smoking since his last visit 6 months ago.   Past Medical History  Diagnosis Date  . AAA (abdominal aortic aneurysm)   . Cough   . Shortness of breath   . COPD (chronic obstructive pulmonary disease)   . Hyperlipidemia   . CAD (coronary artery disease)   . Hypertension   . Ulcer     gastric  . Aneurysm   . Prediabetes   . DVT (deep venous thrombosis)    Past Surgical History  Procedure Laterality Date  . Lipoma excision      right lower back  . Partial gastrectomy      approx 1990  . Cardiac surgery     Social History History   Social History  . Marital Status: Married    Spouse Name: N/A    Number of Children: N/A  . Years of  Education: N/A   Occupational History  . Not on file.   Social History Main Topics  . Smoking status: Former Smoker -- 0.50 packs/day for 55 years    Types: Cigarettes    Quit date: 03/22/2013  . Smokeless tobacco: Never Used  . Alcohol Use: No  . Drug Use: No  . Sexual Activity: Not on file   Other Topics Concern  . Not on file   Social History Narrative  . No narrative on file   Family History Family History  Problem Relation Age of Onset  . Hypertension Brother   . Heart disease Brother     Heart Disease before age 51  . Heart attack Brother   . Cancer Mother   . Diabetes Mother   . Heart disease Mother     Heart Disease before age 50  . Hypertension Mother   . Cancer Father     prostate  . Hypertension Father   . COPD Sister   . Diabetes Sister   . Heart disease Sister   . Hypertension Sister   . Heart attack Sister   . Colon cancer Neg Hx     Current Outpatient Prescriptions on File Prior to Visit  Medication Sig Dispense Refill  . albuterol (PROVENTIL) (  2.5 MG/3ML) 0.083% nebulizer solution USE 1 VIAL VIA NEBULIZER EVERY 4 HOURS AS NEEDED  150 mL  5  . ALPRAZolam (XANAX) 1 MG tablet Take 0.5-1 mg by mouth 3 (three) times daily as needed for anxiety (Takes one-half tablet in the morning and afternoon. takes one tablet at bedtime daily).      Marland Kitchen aspirin 81 MG tablet Take 81 mg by mouth every morning.       . benazepril (LOTENSIN) 40 MG tablet TAKE 1 TABLET BY MOUTH DAILY  120 tablet  5  . budesonide-formoterol (SYMBICORT) 160-4.5 MCG/ACT inhaler Inhale 2 puffs into the lungs 2 (two) times daily.  1 Inhaler  0  . citalopram (CELEXA) 20 MG tablet Take 20 mg by mouth at bedtime.      . furosemide (LASIX) 20 MG tablet TAKE 1 TABLET BY MOUTH TWICE DAILY  60 tablet  2  . HYDROcodone-acetaminophen (NORCO) 10-325 MG per tablet Take 1 tablet by mouth 2 (two) times daily as needed (pain).  60 tablet  0  . nicotine (NICODERM CQ - DOSED IN MG/24 HOURS) 21 mg/24hr patch  Place 1 patch (21 mg total) onto the skin daily.  28 patch  0  . nitroGLYCERIN (NITROSTAT) 0.4 MG SL tablet Place 1 tablet (0.4 mg total) under the tongue every 5 (five) minutes as needed for chest pain.  100 tablet  0  . potassium chloride SA (K-DUR,KLOR-CON) 20 MEQ tablet TAKE 1 TABLET BY MOUTH EVERY DAY  90 tablet  1  . tiotropium (SPIRIVA) 18 MCG inhalation capsule Place 1 capsule (18 mcg total) into inhaler and inhale daily.  90 capsule  3  . UNABLE TO FIND Oxygen 2 liters.      . VENTOLIN HFA 108 (90 BASE) MCG/ACT inhaler INHALE 2 PUFFS EVERY 4-6 HOURS AS NEEDED  18 g  5   No current facility-administered medications on file prior to visit.   No Known Allergies  ROS: See HPI for pertinent positives and negatives.  Physical Examination  Filed Vitals:   12/07/13 1010  BP: 107/64  Pulse: 56  Resp: 16  Height: 5\' 7"  (1.702 m)  Weight: 147 lb (66.679 kg)  SpO2: 90%    General: A&O x 3, WD.  Pulmonary: Sym exp, good air movt, CTAB, no rales, rhonchi, or wheezing.  Cardiac: RRR, Nl S1, S2, no Murmur detected.   Carotid Bruits  Left  Right    Negative  Negative    Aorta is not palpable  Radial pulses are 2+ and =   VASCULAR EXAM:  LE Pulses  LEFT  RIGHT   FEMORAL  3+palpable  3+palpable   POPLITEAL  2+ palpable  2+ palpable   POSTERIOR TIBIAL  2+palpable  2+palpable   DORSALIS PEDIS  ANTERIOR TIBIAL  Not palpable  2+palpable    Gastrointestinal: soft, NTND, -G/R, - HSM, - masses, - CVAT B.  Musculoskeletal: M/S 5/5 throughout, Extremities without ischemic changes.  Neurologic: CN 2-12 intact, Pain and light touch intact in extremities, Motor exam as listed above.  Non-Invasive Vascular Imaging  AAA Duplex (12/07/2013)  ABDOMINAL AORTA DUPLEX EVALUATION    INDICATION: Evaluation of abdominal aorta.    PREVIOUS INTERVENTION(S):     DUPLEX EXAM:     LOCATION DIAMETER AP (cm) DIAMETER TRANSVERSE (cm) VELOCITIES (cm/sec)  Aorta Proximal Not Visualized  Not  Visualized    Aorta Mid 5.14 5.19   Aorta Distal Not Visualized  Not Visualized    Right Common Iliac Artery Not Visualized  Not Visualized    Left Common Iliac Artery Not Visualized  Not Visualized      Previous max aortic diameter:  5.0 x 4.8 Date: 06/12/2013     ADDITIONAL FINDINGS: Technically difficult exam due to excessive bowel gas.    IMPRESSION: Abdominal aortic aneurysm measuring approximately 5.14 x 5.19 cm in diameter; based on limited visualization.    Compared to the previous exam:  Slightly increased diameter compared to last exam.    LOWER EXTREMITY ARTERIAL DUPLEX EVALUATION      INDICATION: Evaluate for popliteal aneurysm.    PREVIOUS INTERVENTION(S):     DUPLEX EXAM:     Popliteal Artery Diameter   AP (cm) Transverse (cm)  Right 0.76 0.70  Left 0.67 0.62     ADDITIONAL FINDINGS:     IMPRESSION: No evidence of ectasia or aneurysm of the bilateral popliteal arteries.    Medical Decision Making  The patient is a 73 y.o. male who presents with asymptomatic AAA with uncertainty re status of size of aneurysm due to overlying bowel gas on Duplex today. He will need a CTA abdomen/pelvis, his last CTA abd/pelvis was in 2012, he did have a CTA chest in 2014 which did not include abdominal vasculature. No evidence of popliteal aneurysms or ectasia on today's bilateral LE arterial Duplex.  Patient was counseled re smoking cessation.  Based on this patient's exam and diagnostic studies, and after discussing with Dr. Trula Slade, the patient will follow up on 12/18/13 to see Dr. Bridgett Larsson after undergoing a CTA abdomen/pelvis.  Consideration for repair of AAA would be made when the size is 5.5 cm, growth > 1 cm/yr, and symptomatic status.  I emphasized the importance of maximal medical management including strict control of blood pressure, blood glucose, and lipid levels, antiplatelet agents, obtaining regular exercise, and  cessation of smoking.   The patient was given  information about AAA including signs, symptoms, treatment, and how to minimize the risk of enlargement and rupture of aneurysms.    The patient was advised to call [phone] should the patient experience sudden onset abdominal or back pain.   Thank you for allowing [place] to participate in this patient's care.  Clemon Chambers, RN, MSN, FNP-C [facility] Office: [facility] Physician: Trula Slade  12/07/2013, 9:36 AM

## 2013-12-07 NOTE — Patient Instructions (Addendum)
Abdominal Aortic Aneurysm An aneurysm is a weakened or damaged part of an artery wall that bulges from the normal force of blood pumping through the body. An abdominal aortic aneurysm is an aneurysm that occurs in the lower part of the aorta, the main artery of the body.  The major concern with an abdominal aortic aneurysm is that it can enlarge and burst (rupture) or blood can flow between the layers of the wall of the aorta through a tear (aorticdissection). Both of these conditions can cause bleeding inside the body and can be life threatening unless diagnosed and treated promptly. CAUSES  The exact cause of an abdominal aortic aneurysm is unknown. Some contributing factors are:   A hardening of the arteries caused by the buildup of fat and other substances in the lining of a blood vessel (arteriosclerosis).  Inflammation of the walls of an artery (arteritis).   Connective tissue diseases, such as Marfan syndrome.   Abdominal trauma.   An infection, such as syphilis or staphylococcus, in the wall of the aorta (infectious aortitis) caused by bacteria. RISK FACTORS  Risk factors that contribute to an abdominal aortic aneurysm may include:  Age older than 60 years.   High blood pressure (hypertension).  Male gender.  Ethnicity (white race).  Obesity.  Family history of aneurysm (first degree relatives only).  Tobacco use. PREVENTION  The following healthy lifestyle habits may help decrease your risk of abdominal aortic aneurysm:  Quitting smoking. Smoking can raise your blood pressure and cause arteriosclerosis.  Limiting or avoiding alcohol.  Keeping your blood pressure, blood sugar level, and cholesterol levels within normal limits.  Decreasing your salt intake. In somepeople, too much salt can raise blood pressure and increase your risk of abdominal aortic aneurysm.  Eating a diet low in saturated fats and cholesterol.  Increasing your fiber intake by including  whole grains, vegetables, and fruits in your diet. Eating these foods may help lower blood pressure.  Maintaining a healthy weight.  Staying physically active and exercising regularly. SYMPTOMS  The symptoms of abdominal aortic aneurysm may vary depending on the size and rate of growth of the aneurysm.Most grow slowly and do not have any symptoms. When symptoms do occur, they may include:  Pain (abdomen, side, lower back, or groin). The pain may vary in intensity. A sudden onset of severe pain may indicate that the aneurysm has ruptured.  Feeling full after eating only small amounts of food.  Nausea or vomiting or both.  Feeling a pulsating lump in the abdomen.  Feeling faint or passing out. DIAGNOSIS  Since most unruptured abdominal aortic aneurysms have no symptoms, they are often discovered during diagnostic exams for other conditions. An aneurysm may be found during the following procedures:  Ultrasonography (A one-time screening for abdominal aortic aneurysm by ultrasonography is also recommended for all men aged 65-75 years who have ever smoked).  X-ray exams.  A computed tomography (CT).  Magnetic resonance imaging (MRI).  Angiography or arteriography. TREATMENT  Treatment of an abdominal aortic aneurysm depends on the size of your aneurysm, your age, and risk factors for rupture. Medication to control blood pressure and pain may be used to manage aneurysms smaller than 6 cm. Regular monitoring for enlargement may be recommended by your caregiver if:  The aneurysm is 3-4 cm in size (an annual ultrasonography may be recommended).  The aneurysm is 4-4.5 cm in size (an ultrasonography every 6 months may be recommended).  The aneurysm is larger than 4.5 cm in   size (your caregiver may ask that you be examined by a vascular surgeon). If your aneurysm is larger than 6 cm, surgical repair may be recommended. There are two main methods for repair of an aneurysm:   Endovascular  repair (a minimally invasive surgery). This is done most often.  Open repair. This method is used if an endovascular repair is not possible. Document Released: 01/31/2005 Document Revised: 08/18/2012 Document Reviewed: 05/23/2012 ExitCare Patient Information 2015 [employer]. This information is not intended to replace advice given to you by your health care provider. Make sure you discuss any questions you have with your health care provider.   Smoking Cessation Quitting smoking is important to your health and has many advantages. However, it is not always easy to quit since nicotine is a very addictive drug. Oftentimes, people try 3 times or more before being able to quit. This document explains the best ways for you to prepare to quit smoking. Quitting takes hard work and a lot of effort, but you can do it. ADVANTAGES OF QUITTING SMOKING  You will live longer, feel better, and live better.  Your body will feel the impact of quitting smoking almost immediately.  Within 20 minutes, blood pressure decreases. Your pulse returns to its normal level.  After 8 hours, carbon monoxide levels in the blood return to normal. Your oxygen level increases.  After 24 hours, the chance of having a heart attack starts to decrease. Your breath, hair, and body stop smelling like smoke.  After 48 hours, damaged nerve endings begin to recover. Your sense of taste and smell improve.  After 72 hours, the body is virtually free of nicotine. Your bronchial tubes relax and breathing becomes easier.  After 2 to 12 weeks, lungs can hold more air. Exercise becomes easier and circulation improves.  The risk of having a heart attack, stroke, cancer, or lung disease is greatly reduced.  After 1 year, the risk of coronary heart disease is cut in half.  After 5 years, the risk of stroke falls to the same as a nonsmoker.  After 10 years, the risk of lung cancer is cut in half and the risk of other cancers  decreases significantly.  After 15 years, the risk of coronary heart disease drops, usually to the level of a nonsmoker.  If you are pregnant, quitting smoking will improve your chances of having a healthy baby.  The people you live with, especially any children, will be healthier.  You will have extra money to spend on things other than cigarettes. QUESTIONS TO THINK ABOUT BEFORE ATTEMPTING TO QUIT You may want to talk about your answers with your health care provider.  Why do you want to quit?  If you tried to quit in the past, what helped and what did not?  What will be the most difficult situations for you after you quit? How will you plan to handle them?  Who can help you through the tough times? Your family? Friends? A health care provider?  What pleasures do you get from smoking? What ways can you still get pleasure if you quit? Here are some questions to ask your health care provider:  How can you help me to be successful at quitting?  What medicine do you think would be best for me and how should I take it?  What should I do if I need more help?  What is smoking withdrawal like? How can I get information on withdrawal? GET READY  Set a quit   date.  Change your environment by getting rid of all cigarettes, ashtrays, matches, and lighters in your home, car, or work. Do not let people smoke in your home.  Review your past attempts to quit. Think about what worked and what did not. GET SUPPORT AND ENCOURAGEMENT You have a better chance of being successful if you have help. You can get support in many ways.  Tell your family, friends, and coworkers that you are going to quit and need their support. Ask them not to smoke around you.  Get individual, group, or telephone counseling and support. Programs are available at local hospitals and health centers. Call your local health department for information about programs in your area.  Spiritual beliefs and practices may  help some smokers quit.  Download a "quit meter" on your computer to keep track of quit statistics, such as how long you have gone without smoking, cigarettes not smoked, and money saved.  Get a self-help book about quitting smoking and staying off tobacco. LEARN NEW SKILLS AND BEHAVIORS  Distract yourself from urges to smoke. Talk to someone, go for a walk, or occupy your time with a task.  Change your normal routine. Take a different route to work. Drink tea instead of coffee. Eat breakfast in a different place.  Reduce your stress. Take a hot bath, exercise, or read a book.  Plan something enjoyable to do every day. Reward yourself for not smoking.  Explore interactive web-based programs that specialize in helping you quit. GET MEDICINE AND USE IT CORRECTLY Medicines can help you stop smoking and decrease the urge to smoke. Combining medicine with the above behavioral methods and support can greatly increase your chances of successfully quitting smoking.  Nicotine replacement therapy helps deliver nicotine to your body without the negative effects and risks of smoking. Nicotine replacement therapy includes nicotine gum, lozenges, inhalers, nasal sprays, and skin patches. Some may be available over-the-counter and others require a prescription.  Antidepressant medicine helps people abstain from smoking, but how this works is unknown. This medicine is available by prescription.  Nicotinic receptor partial agonist medicine simulates the effect of nicotine in your brain. This medicine is available by prescription. Ask your health care provider for advice about which medicines to use and how to use them based on your health history. Your health care provider will tell you what side effects to look out for if you choose to be on a medicine or therapy. Carefully read the information on the package. Do not use any other product containing nicotine while using a nicotine replacement product.    RELAPSE OR DIFFICULT SITUATIONS Most relapses occur within the first 3 months after quitting. Do not be discouraged if you start smoking again. Remember, most people try several times before finally quitting. You may have symptoms of withdrawal because your body is used to nicotine. You may crave cigarettes, be irritable, feel very hungry, cough often, get headaches, or have difficulty concentrating. The withdrawal symptoms are only temporary. They are strongest when you first quit, but they will go away within 10-14 days. To reduce the chances of relapse, try to:  Avoid drinking alcohol. Drinking lowers your chances of successfully quitting.  Reduce the amount of caffeine you consume. Once you quit smoking, the amount of caffeine in your body increases and can give you symptoms, such as a rapid heartbeat, sweating, and anxiety.  Avoid smokers because they can make you want to smoke.  Do not let weight gain distract you. Many   smokers will gain weight when they quit, usually less than 10 pounds. Eat a healthy diet and stay active. You can always lose the weight gained after you quit.  Find ways to improve your mood other than smoking. FOR MORE INFORMATION  www.smokefree.gov  Document Released: 04/17/2001 Document Revised: 09/07/2013 Document Reviewed: 08/02/2011 ExitCare Patient Information 2015 [employer]. This information is not intended to replace advice given to you by your health care provider. Make sure you discuss any questions you have with your health care provider.  

## 2013-12-12 ENCOUNTER — Other Ambulatory Visit: Payer: Self-pay | Admitting: Family Medicine

## 2013-12-14 ENCOUNTER — Other Ambulatory Visit: Payer: Self-pay | Admitting: Family Medicine

## 2013-12-14 DIAGNOSIS — R972 Elevated prostate specific antigen [PSA]: Secondary | ICD-10-CM

## 2013-12-14 LAB — PSA (REFLEX TO FREE) (SERIAL): PSA: 5.56 ng/mL — AB (ref <=4.00)

## 2013-12-14 LAB — PSA, FREE
PSA, Free Pct: 18 % — ABNORMAL LOW (ref >25)
PSA, Free: 0.98 ng/mL

## 2013-12-14 LAB — PSA, MEDICARE: PSA: 5.56 ng/mL — ABNORMAL HIGH (ref <=4.00)

## 2013-12-17 ENCOUNTER — Encounter: Payer: Self-pay | Admitting: Vascular Surgery

## 2013-12-18 ENCOUNTER — Ambulatory Visit (INDEPENDENT_AMBULATORY_CARE_PROVIDER_SITE_OTHER): Payer: 59 | Admitting: Vascular Surgery

## 2013-12-18 ENCOUNTER — Ambulatory Visit
Admission: RE | Admit: 2013-12-18 | Discharge: 2013-12-18 | Disposition: A | Discharge status: Home / Self Care | Payer: 59 | Source: Ambulatory Visit | Attending: Vascular Surgery | Admitting: Vascular Surgery

## 2013-12-18 ENCOUNTER — Encounter: Payer: Self-pay | Admitting: Vascular Surgery

## 2013-12-18 VITALS — BP 129/60 | HR 67 | Ht 67.0 in | Wt 145.0 lb

## 2013-12-18 DIAGNOSIS — I714 Abdominal aortic aneurysm, without rupture, unspecified: Secondary | ICD-10-CM

## 2013-12-18 DIAGNOSIS — Z01810 Encounter for preprocedural cardiovascular examination: Secondary | ICD-10-CM

## 2013-12-18 DIAGNOSIS — I724 Aneurysm of artery of lower extremity: Secondary | ICD-10-CM

## 2013-12-18 MED ORDER — IOHEXOL 350 MG/ML SOLN
75.0000 mL | Freq: Once | INTRAVENOUS | Status: AC | PRN
  Administered 2013-12-18: 75 mL via INTRAVENOUS

## 2013-12-18 NOTE — Progress Notes (Signed)
    Established Abdominal Aortic Aneurysm  History of Present Illness  The patient is a 73 y.o. (March 27, 1941) male who presents with chief complaint: follow up for AAA.  Previous studies demonstrate an AAA, measuring 5.1 cm x 5.2 cm.  The patient does not have back or abdominal pain.  The patient is not a smoker: quit in 2014  The patient's PMH, PSH, SH, FamHx, Med, and Allergies are unchanged from 12/07/13.  On ROS today: no back pain, no embolic sx  Physical Examination  Filed Vitals:   12/18/13 1136  BP: 129/60  Pulse: 67  Height: 5\' 7"  (1.702 m)  Weight: 145 lb (65.772 kg)  SpO2: 93%   Body mass index is 22.71 kg/(m^2).  General: A&O x 3, WDWN  Pulmonary: Sym exp, good air movt, CTAB, no rales, rhonchi, & wheezing  Cardiac: RRR, Nl S1, S2, no Murmurs, rubs or gallops  Vascular: Vessel Right Left  Radial Palpable Palpable  Brachial Palpable Palpable  Carotid Palpable, without bruit Palpable, without bruit  Aorta Not palpable N/A  Femoral Palpable Palpable  Popliteal Not palpable Not palpable  PT Palpable Palpable  DP Faintly Palpable Faintly Palpable   Gastrointestinal: soft, NTND, -G/R, - HSM, - masses, - CVAT B  Musculoskeletal: M/S 5/5 throughout , Extremities without ischemic changes   Neurologic: Pain and light touch intact in extremities , Motor exam as listed above  CTA Abd/pelvis (12/18/2013)  Official read not available  Based on my review of this patient's CTA, he has an infrarenal AAA approaching repair size with adequate aortic neck and adequate access vessels for EVAR.  Medical Decision Making  The patient is a 72 y.o. male who presents with: asymptomatic AAA with aggressive growth.   While the patient pt's AAA is not quite 5.5 cm, in reviewing his studies, his aneurysm growth rate has been >0.3 cm/year and essentially he will be at 5.5 cm in the next 6-12 months.    At this point, I have offered EVAR to the patient.  The pt will be sent to  Cardiology for preoperative risk stratification and optimization in case of conversion to OAR.  The patient will follow up in 2 weeks for his preoperative counseling and scheduling.  I will have the CTA reviewed by my [employer] rep to order the appropriate pieces for the EVAR.  I emphasized the importance of maximal medical management including strict control of blood pressure, blood glucose, and lipid levels, antiplatelet agents, obtaining regular exercise, and cessation of smoking.    Thank you for allowing [place] to participate in this patient's care.  Adele Barthel, MD [facility] Office: [phone] Pager: [phone]  12/18/2013, 12:06 PM

## 2013-12-25 ENCOUNTER — Encounter: Payer: Self-pay | Admitting: Cardiology

## 2013-12-25 ENCOUNTER — Ambulatory Visit (INDEPENDENT_AMBULATORY_CARE_PROVIDER_SITE_OTHER): Payer: 59 | Admitting: Cardiology

## 2013-12-25 VITALS — BP 111/65 | HR 60 | Ht 67.0 in | Wt 148.0 lb

## 2013-12-25 DIAGNOSIS — Z01810 Encounter for preprocedural cardiovascular examination: Secondary | ICD-10-CM

## 2013-12-25 DIAGNOSIS — I509 Heart failure, unspecified: Secondary | ICD-10-CM

## 2013-12-25 MED ORDER — FUROSEMIDE 40 MG PO TABS: 40.0000 mg | ORAL_TABLET | Freq: Two times a day (BID) | ORAL | Status: DC

## 2013-12-25 MED ORDER — ATORVASTATIN CALCIUM 80 MG PO TABS: 80.0000 mg | ORAL_TABLET | Freq: Every day | ORAL | Status: DC

## 2013-12-25 NOTE — Patient Instructions (Signed)
Your physician has requested that you have an echocardiogram. Echocardiography is a painless test that uses sound waves to create images of your heart. It provides your doctor with information about the size and shape of your heart and how well your heart's chambers and valves are working. This procedure takes approximately one hour. There are no restrictions for this procedure. Office will contact with results via phone or letter.   Increase Lasix to 40mg  twice a day   Begin Lipitor 80mg  daily  New prescriptions sent to pharm  Continue all other medications.   Follow up pending test results

## 2013-12-25 NOTE — Progress Notes (Signed)
Clinical Summary Tommy Turner is a 73 y.o.male seen today as a new patient for the following medical problems. Previously seen by Dr Lattie Haw   1. Preoperative cardiac evaluation - patient with history of AAA followed by vacular, 5.1 x 5.2 cm. Noted rapid growth rate, being considered for EVAR.    2. CAD/ICM - Reports history of MI 20 years ago at [facility]. Family reports CABG at that time.  - from Dr Earline Mayotte old notes CABG in 2000, op note not in our system - no chest pain. DOE at <1/2 block which is stable over several years. Occas LE edema. No orthopnea. - family reports previously taken off Toprol, unclear why  Past Medical History  Diagnosis Date  . AAA (abdominal aortic aneurysm)   . Cough   . Shortness of breath   . COPD (chronic obstructive pulmonary disease)   . Hyperlipidemia   . CAD (coronary artery disease)   . Hypertension   . Ulcer     gastric  . Aneurysm   . Prediabetes   . DVT (deep venous thrombosis)      No Known Allergies   Current Outpatient Prescriptions  Medication Sig Dispense Refill  . albuterol (PROVENTIL) (2.5 MG/3ML) 0.083% nebulizer solution USE 1 VIAL VIA NEBULIZER EVERY 4 HOURS AS NEEDED  150 mL  5  . ALPRAZolam (XANAX) 1 MG tablet Take 0.5-1 mg by mouth 3 (three) times daily as needed for anxiety (Takes one-half tablet in the morning and afternoon. takes one tablet at bedtime daily).      Marland Kitchen aspirin 81 MG tablet Take 81 mg by mouth every morning.       . benazepril (LOTENSIN) 40 MG tablet TAKE 1 TABLET BY MOUTH DAILY  120 tablet  5  . budesonide-formoterol (SYMBICORT) 160-4.5 MCG/ACT inhaler Inhale 2 puffs into the lungs 2 (two) times daily.  1 Inhaler  0  . citalopram (CELEXA) 20 MG tablet Take 20 mg by mouth at bedtime.      . furosemide (LASIX) 20 MG tablet TAKE 1 TABLET BY MOUTH TWICE DAILY  60 tablet  2  . HYDROcodone-acetaminophen (NORCO) 10-325 MG per tablet Take 1 tablet by mouth 2 (two) times daily as needed (pain).  60  tablet  0  . nicotine (NICODERM CQ - DOSED IN MG/24 HOURS) 21 mg/24hr patch Place 1 patch (21 mg total) onto the skin daily.  28 patch  0  . nitroGLYCERIN (NITROSTAT) 0.4 MG SL tablet Place 1 tablet (0.4 mg total) under the tongue every 5 (five) minutes as needed for chest pain.  100 tablet  0  . potassium chloride SA (K-DUR,KLOR-CON) 20 MEQ tablet TAKE 1 TABLET BY MOUTH EVERY DAY  90 tablet  1  . tiotropium (SPIRIVA) 18 MCG inhalation capsule Place 1 capsule (18 mcg total) into inhaler and inhale daily.  90 capsule  3  . UNABLE TO FIND Oxygen 2 liters.      . VENTOLIN HFA 108 (90 BASE) MCG/ACT inhaler INHALE 2 PUFFS EVERY 4-6 HOURS AS NEEDED  18 g  5   No current facility-administered medications for this visit.     Past Surgical History  Procedure Laterality Date  . Lipoma excision      right lower back  . Partial gastrectomy      approx 1990  . Cardiac surgery       No Known Allergies    Family History  Problem Relation Age of Onset  . Hypertension Brother   .  Heart disease Brother     Heart Disease before age 102  . Heart attack Brother   . Cancer Mother   . Diabetes Mother   . Heart disease Mother     Heart Disease before age 63  . Hypertension Mother   . Cancer Father     prostate  . Hypertension Father   . COPD Sister   . Diabetes Sister   . Heart disease Sister   . Hypertension Sister   . Heart attack Sister   . Colon cancer Neg Hx      Social History [name] reports that he quit smoking about 9 months ago. His smoking use included Cigarettes. He has a 27.5 pack-year smoking history. He has never used smokeless tobacco. [name] reports that he does not drink alcohol.   Review of Systems CONSTITUTIONAL: No weight loss, fever, chills, weakness or fatigue.  HEENT: Eyes: No visual loss, blurred vision, double vision or yellow sclerae.No hearing loss, sneezing, congestion, runny nose or sore throat.  SKIN: No rash or itching.  CARDIOVASCULAR: per  HPI RESPIRATORY: No shortness of breath, cough or sputum.  GASTROINTESTINAL: No anorexia, nausea, vomiting or diarrhea. No abdominal pain or blood.  GENITOURINARY: No burning on urination, no polyuria NEUROLOGICAL: No headache, dizziness, syncope, paralysis, ataxia, numbness or tingling in the extremities. No change in bowel or bladder control.  MUSCULOSKELETAL: No muscle, back pain, joint pain or stiffness.  LYMPHATICS: No enlarged nodes. No history of splenectomy.  PSYCHIATRIC: No history of depression or anxiety.  ENDOCRINOLOGIC: No reports of sweating, cold or heat intolerance. No polyuria or polydipsia.  Marland Kitchen   Physical Examination p 60 bp 111/65 Wt 148 lbs BMI 23 Gen: resting comfortably, no acute distress HEENT: no scleral icterus, pupils equal round and reactive, no palptable cervical adenopathy,  CV: RRR, no m/r/g, 1-2+ LE edema Resp: Clear to auscultation bilaterally GI: abdomen is soft, non-tender, non-distended, normal bowel sounds, no hepatosplenomegaly MSK: extremities are warm, no edema.  Skin: warm, no rash Neuro:  no focal deficits Psych: appropriate affect   Diagnostic Studies  07/2009 Echo Study Conclusions  - Left ventricle: The cavity size was normal. There was mild focal   basal hypertrophy of the septum. Systolic function was moderately   to severely reduced. The estimated ejection fraction was in the   range of 30% to 35%. There is akinesis of the inferolateral,   inferoseptal, and apical myocardium. Doppler parameters are   consistent with abnormal left ventricular relaxation (grade 1   diastolic dysfunction). - Aortic root: The aortic root was upper normal in size. - Mitral valve: Calcified annulus. Mild regurgitation. - Left atrium: The atrium was mildly dilated. - Right ventricle: The cavity size was mildly dilated. Systolic   function was mildly reduced. - Tricuspid valve: Trivial regurgitation. - Pericardium, extracardiac: There was no pericardial  effusion.     Assessment and Plan  1. CAD/ICM - from prior notes hx of CABG in 2000 , last LVEF we have on file is from 07/2009 at 30-35% - he has not been seen by cardiology in some time - we will repeat echo to see where his LVEF is - he is not on beta blocker I presume due to low baseline heart rates, continue ACE-I - he has evidence of volume overload, will increase lasix to 40mg  bid.  - increase lipitor to 80mg  daily, high dose statin recommended due to his hx of CAD.   2. Preoperative evaluation - recommendations pending repeat echo,  will need to diurese prior to going to surgery as well.   F/u pending echo results. Likely 2-3 weeks.      Arnoldo Lenis, M.D., F.A.C.C.

## 2013-12-29 ENCOUNTER — Ambulatory Visit: Payer: 59 | Admitting: Family Medicine

## 2013-12-29 ENCOUNTER — Ambulatory Visit (HOSPITAL_COMMUNITY)
Admission: RE | Admit: 2013-12-29 | Discharge: 2013-12-29 | Disposition: A | Discharge status: Home / Self Care | Payer: 59 | Source: Ambulatory Visit | Attending: Cardiology | Admitting: Cardiology

## 2013-12-29 DIAGNOSIS — I080 Rheumatic disorders of both mitral and aortic valves: Secondary | ICD-10-CM | POA: Diagnosis not present

## 2013-12-29 DIAGNOSIS — I2510 Atherosclerotic heart disease of native coronary artery without angina pectoris: Secondary | ICD-10-CM | POA: Insufficient documentation

## 2013-12-29 DIAGNOSIS — J449 Chronic obstructive pulmonary disease, unspecified: Secondary | ICD-10-CM | POA: Insufficient documentation

## 2013-12-29 DIAGNOSIS — F17200 Nicotine dependence, unspecified, uncomplicated: Secondary | ICD-10-CM | POA: Diagnosis not present

## 2013-12-29 DIAGNOSIS — J4489 Other specified chronic obstructive pulmonary disease: Secondary | ICD-10-CM | POA: Insufficient documentation

## 2013-12-29 DIAGNOSIS — I359 Nonrheumatic aortic valve disorder, unspecified: Secondary | ICD-10-CM | POA: Insufficient documentation

## 2013-12-29 DIAGNOSIS — I10 Essential (primary) hypertension: Secondary | ICD-10-CM | POA: Diagnosis not present

## 2013-12-29 DIAGNOSIS — E785 Hyperlipidemia, unspecified: Secondary | ICD-10-CM | POA: Insufficient documentation

## 2013-12-29 DIAGNOSIS — R0989 Other specified symptoms and signs involving the circulatory and respiratory systems: Secondary | ICD-10-CM | POA: Insufficient documentation

## 2013-12-29 DIAGNOSIS — I509 Heart failure, unspecified: Secondary | ICD-10-CM

## 2013-12-29 DIAGNOSIS — I501 Left ventricular failure: Secondary | ICD-10-CM | POA: Diagnosis not present

## 2013-12-29 DIAGNOSIS — R0609 Other forms of dyspnea: Secondary | ICD-10-CM | POA: Insufficient documentation

## 2013-12-29 NOTE — Progress Notes (Signed)
  Echocardiogram 2D Echocardiogram has been performed.  [place], [place] 12/29/2013, 11:23 AM

## 2013-12-31 ENCOUNTER — Encounter: Payer: Self-pay | Admitting: Vascular Surgery

## 2014-01-01 ENCOUNTER — Encounter: Payer: Self-pay | Admitting: Vascular Surgery

## 2014-01-01 ENCOUNTER — Other Ambulatory Visit: Payer: Self-pay

## 2014-01-01 ENCOUNTER — Ambulatory Visit (INDEPENDENT_AMBULATORY_CARE_PROVIDER_SITE_OTHER): Payer: 59 | Admitting: Vascular Surgery

## 2014-01-01 VITALS — BP 135/75 | HR 64 | Ht 67.0 in | Wt 149.1 lb

## 2014-01-01 DIAGNOSIS — I714 Abdominal aortic aneurysm, without rupture, unspecified: Secondary | ICD-10-CM

## 2014-01-01 NOTE — Progress Notes (Addendum)
Established Abdominal Aortic Aneurysm  History of Present Illness  The patient is a 73 y.o. (07-02-1940) male who presents with chief complaint: follow up for AAA.  Previous studies demonstrate an AAA, measuring 5.2 cm.  The patient does not have back or abdominal pain.  The patient is not a smoker: quit 2014.  Pt was referred to Cardiology for preop optimization for anticipated EVAR vs OAR for abnormal growth of aneurysm.  Pt has been consistently growing >0.5 cm/year.  Pt notes anxiety over rupture of his AAA which has been interfering with his ability to sleep.  Reviewing his Cardiology note reviews need for additional optimization.  Past Medical History  Diagnosis Date  . AAA (abdominal aortic aneurysm)   . Cough   . Shortness of breath   . COPD (chronic obstructive pulmonary disease)   . Hyperlipidemia   . CAD (coronary artery disease)   . Hypertension   . Ulcer     gastric  . Aneurysm   . Prediabetes   . DVT (deep venous thrombosis)     Past Surgical History  Procedure Laterality Date  . Lipoma excision      right lower back  . Partial gastrectomy      approx 1990  . Cardiac surgery      History   Social History  . Marital Status: Married    Spouse Name: N/A    Number of Children: N/A  . Years of Education: N/A   Occupational History  . Not on file.   Social History Main Topics  . Smoking status: Current Every Day Smoker -- 0.50 packs/day for 55 years    Types: Cigarettes    Last Attempt to Quit: 03/22/2013  . Smokeless tobacco: Never Used  . Alcohol Use: No  . Drug Use: No  . Sexual Activity: Not on file   Other Topics Concern  . Not on file   Social History Narrative  . No narrative on file    Family History  Problem Relation Age of Onset  . Hypertension Brother   . Heart disease Brother     Heart Disease before age 1  . Heart attack Brother   . Cancer Mother   . Diabetes Mother   . Heart disease Mother     Heart Disease before age 39    . Hypertension Mother   . Cancer Father     prostate  . Hypertension Father   . COPD Sister   . Diabetes Sister   . Heart disease Sister   . Hypertension Sister   . Heart attack Sister   . Colon cancer Neg Hx     Current Outpatient Prescriptions on File Prior to Visit  Medication Sig Dispense Refill  . albuterol (PROVENTIL) (2.5 MG/3ML) 0.083% nebulizer solution USE 1 VIAL VIA NEBULIZER EVERY 4 HOURS AS NEEDED  150 mL  5  . ALPRAZolam (XANAX) 1 MG tablet Take 0.5-1 mg by mouth 3 (three) times daily as needed for anxiety (Takes one-half tablet in the morning and afternoon. takes one tablet at bedtime daily).      Marland Kitchen aspirin 81 MG tablet Take 81 mg by mouth every morning.       Marland Kitchen atorvastatin (LIPITOR) 80 MG tablet Take 1 tablet (80 mg total) by mouth daily.  30 tablet  6  . benazepril (LOTENSIN) 40 MG tablet TAKE 1 TABLET BY MOUTH DAILY  120 tablet  5  . budesonide-formoterol (SYMBICORT) 160-4.5 MCG/ACT inhaler Inhale 2 puffs into  the lungs 2 (two) times daily.  1 Inhaler  0  . citalopram (CELEXA) 20 MG tablet Take 20 mg by mouth at bedtime.      . furosemide (LASIX) 40 MG tablet Take 1 tablet (40 mg total) by mouth 2 (two) times daily.  60 tablet  6  . HYDROcodone-acetaminophen (NORCO) 10-325 MG per tablet Take 1 tablet by mouth 2 (two) times daily as needed (pain).  60 tablet  0  . nicotine (NICODERM CQ - DOSED IN MG/24 HOURS) 21 mg/24hr patch Place 1 patch (21 mg total) onto the skin daily.  28 patch  0  . nitroGLYCERIN (NITROSTAT) 0.4 MG SL tablet Place 1 tablet (0.4 mg total) under the tongue every 5 (five) minutes as needed for chest pain.  100 tablet  0  . potassium chloride SA (K-DUR,KLOR-CON) 20 MEQ tablet TAKE 1 TABLET BY MOUTH EVERY DAY  90 tablet  1  . tiotropium (SPIRIVA) 18 MCG inhalation capsule Place 1 capsule (18 mcg total) into inhaler and inhale daily.  90 capsule  3  . UNABLE TO FIND Oxygen 2 liters.      . VENTOLIN HFA 108 (90 BASE) MCG/ACT inhaler INHALE 2 PUFFS  EVERY 4-6 HOURS AS NEEDED  18 g  5   No current facility-administered medications on file prior to visit.    No Known Allergies  REVIEW OF SYSTEMS:  (Positives checked otherwise negative)  CARDIOVASCULAR:  []  chest pain, []  chest pressure, []  palpitations, []  shortness of breath when laying flat, []  shortness of breath with exertion,  []  pain in feet when walking, []  pain in feet when laying flat, []  history of blood clot in veins (DVT), []  history of phlebitis, []  swelling in legs, []  varicose veins  PULMONARY:  []  productive cough, []  asthma, []  wheezing  NEUROLOGIC:  []  weakness in arms or legs, []  numbness in arms or legs, []  difficulty speaking or slurred speech, []  temporary loss of vision in one eye, []  dizziness  HEMATOLOGIC:  []  bleeding problems, []  problems with blood clotting too easily  MUSCULOSKEL:  []  joint pain, []  joint swelling  GASTROINTEST:  []  vomiting blood, []  blood in stool     GENITOURINARY:  []  burning with urination, []  blood in urine  PSYCHIATRIC:  []  history of major depression, [x]  anxiety  INTEGUMENTARY:  []  rashes, []  ulcers    Physical Examination  Filed Vitals:   01/01/14 0903  BP: 135/75  Pulse: 64  Height: 5\' 7"  (1.702 m)  Weight: 149 lb 1.6 oz (67.631 kg)  SpO2: 95%   Body mass index is 23.35 kg/(m^2).  General: A&O x 3, WDWN   Pulmonary: Sym exp, good air movt, CTAB, no rales, rhonchi, & wheezing   Cardiac: RRR, Nl S1, S2, no Murmurs, rubs or gallops   Vascular:  Vessel  Right  Left   Radial  Palpable  Palpable   Brachial  Palpable  Palpable   Carotid  Palpable, without bruit  Palpable, without bruit   Aorta  Not palpable  N/A   Femoral  Palpable  Palpable   Popliteal  Not palpable  Not palpable   PT  Palpable  Palpable   DP  Faintly Palpable  Faintly Palpable    Gastrointestinal: soft, NTND, -G/R, - HSM, - masses, - CVAT B   Musculoskeletal: M/S 5/5 throughout , Extremities without ischemic changes   Neurologic: Pain  and light touch intact in extremities , Motor exam as listed above   Medical Decision Making  The patient is a 73 y.o. male who presents with: asymptomatic AAA with abnormal increasing size.   Based on this patient's exam and diagnostic studies, he needs: EVAR.  This is scheduled for 22 SEP 15 with Dr. Scot Dock.  The extra time should given [place] a cushion to further optimize his cardiac function.  Pt will be at 5.5 cm within 6-12 months.  I don't see any need to make the patient wait, especially given the anxiety he is having over his disease process. The patient is aware the risks of endovascular aortic surgery include but are not limited to: bleeding, need for transfusion, infection, death, stroke, paralysis, wound complications, bowel ischemia, extended ventilation, anaphylactic reaction to contrast, contrast induced nephropathy, embolism, and need for additional procedure to address endoleaks.  Overall, I cited a mortality rate of 1-2% and morbidity rate of 15%.  I emphasized the importance of maximal medical management including strict control of blood pressure, blood glucose, and lipid levels, antiplatelet agents, obtaining regular exercise, and cessation of smoking.    Thank you for allowing [place] to participate in this patient's care.  Adele Barthel, MD [facility] Office: [phone] Pager: [phone]  01/01/2014, 9:45 AM

## 2014-01-04 ENCOUNTER — Encounter: Payer: Self-pay | Admitting: Family Medicine

## 2014-01-04 ENCOUNTER — Telehealth: Payer: Self-pay | Admitting: *Deleted

## 2014-01-04 ENCOUNTER — Encounter: Payer: Self-pay | Admitting: Vascular Surgery

## 2014-01-04 NOTE — Telephone Encounter (Signed)
Notes Recorded by Laurine Blazer, LPN on 6/43/8377 at 9:39 AM OV scheduled for Tuesday, 01/05/2014 at 11:00 in [place] office with Dr. Harl Bowie. ------  Notes Recorded by Laurine Blazer, LPN on 6/88/6484 at 72:07 PM Patient & wife [name]) notified. Stated he has OV tomorrow with Dr. Bridgett Larsson in [place] at 12:45. Wife is questioning if he should keep that appointment or reschedule till after discussing this issue with you.

## 2014-01-04 NOTE — Telephone Encounter (Signed)
Message copied by Laurine Blazer on Mon Jan 04, 2014  8:32 AM ------      Message from: [place] F      Created: Tue Dec 29, 2013  3:35 PM       Echo shows heart function is very weak, about half as strong as normal. This is slightly worst than in 2011. I would like to have him added on this week to discuss in further detail and make further medication changes.            Zandra Abts MD ------

## 2014-01-05 ENCOUNTER — Ambulatory Visit (INDEPENDENT_AMBULATORY_CARE_PROVIDER_SITE_OTHER): Payer: 59 | Admitting: Cardiology

## 2014-01-05 ENCOUNTER — Other Ambulatory Visit: Payer: Self-pay | Admitting: *Deleted

## 2014-01-05 ENCOUNTER — Encounter: Payer: Self-pay | Admitting: Vascular Surgery

## 2014-01-05 ENCOUNTER — Encounter: Payer: Self-pay | Admitting: Cardiology

## 2014-01-05 VITALS — BP 128/68 | HR 62 | Ht 67.0 in | Wt 150.0 lb

## 2014-01-05 DIAGNOSIS — R0602 Shortness of breath: Secondary | ICD-10-CM

## 2014-01-05 DIAGNOSIS — I5022 Chronic systolic (congestive) heart failure: Secondary | ICD-10-CM

## 2014-01-05 MED ORDER — METOPROLOL SUCCINATE ER 25 MG PO TB24: 12.5000 mg | ORAL_TABLET | Freq: Every day | ORAL | Status: DC

## 2014-01-05 MED ORDER — VENTOLIN HFA 108 (90 BASE) MCG/ACT IN AERS: INHALATION_SPRAY | RESPIRATORY_TRACT | Status: DC

## 2014-01-05 NOTE — Patient Instructions (Addendum)
Your physician recommends that you schedule a follow-up appointment in: 2-3 weeks  Your physician has recommended you make the following change in your medication:    START Toprol XL 12.5 mg daily      Your physician has requested that you have en exercise stress myoview. For further information please visit [email or web address]. Please follow instruction sheet, as given. Please HOLD TOPROL XL the morning of test

## 2014-01-05 NOTE — Progress Notes (Signed)
Clinical Summary Tommy Turner is a 73 y.o.male seen today for follow up of the following medical problems.  1. Preoperative cardiac evaluation  - patient with history of AAA followed by vacular, 5.1 x 5.2 cm. Noted rapid growth rate, being considered for EVAR.    2. CAD/ICM  - Reports history of MI 20 years ago at [facility]. Family reports CABG at that time.  - from Dr Earline Mayotte old notes CABG in 2000, op note not in our system. Last visit with our clinic appears to be Jan 2012.  - echo 07/2009 LVEF 30-35%. Repeat echo 12/29/13 LVEF 25-30%, multiple WMAs, grade I diastolic dysfunction, moderate RV dysfunction.    no chest pain. DOE at <1/2 block which is stable over several years. Occas LE edema. No orthopnea. SOB with < 1 flight of stairs.    Past Medical History  Diagnosis Date  . AAA (abdominal aortic aneurysm)   . Cough   . Shortness of breath   . COPD (chronic obstructive pulmonary disease)   . Hyperlipidemia   . CAD (coronary artery disease)   . Hypertension   . Ulcer     gastric  . Aneurysm   . Prediabetes   . DVT (deep venous thrombosis)      No Known Allergies   Current Outpatient Prescriptions  Medication Sig Dispense Refill  . albuterol (PROVENTIL) (2.5 MG/3ML) 0.083% nebulizer solution USE 1 VIAL VIA NEBULIZER EVERY 4 HOURS AS NEEDED  150 mL  5  . ALPRAZolam (XANAX) 1 MG tablet Take 0.5-1 mg by mouth 3 (three) times daily as needed for anxiety (Takes one-half tablet in the morning and afternoon. takes one tablet at bedtime daily).      Marland Kitchen aspirin 81 MG tablet Take 81 mg by mouth every morning.       Marland Kitchen atorvastatin (LIPITOR) 80 MG tablet Take 1 tablet (80 mg total) by mouth daily.  30 tablet  6  . benazepril (LOTENSIN) 40 MG tablet TAKE 1 TABLET BY MOUTH DAILY  120 tablet  5  . budesonide-formoterol (SYMBICORT) 160-4.5 MCG/ACT inhaler Inhale 2 puffs into the lungs 2 (two) times daily.  1 Inhaler  0  . citalopram (CELEXA) 20 MG tablet Take 20 mg by mouth at  bedtime.      . furosemide (LASIX) 40 MG tablet Take 1 tablet (40 mg total) by mouth 2 (two) times daily.  60 tablet  6  . HYDROcodone-acetaminophen (NORCO) 10-325 MG per tablet Take 1 tablet by mouth 2 (two) times daily as needed (pain).  60 tablet  0  . nicotine (NICODERM CQ - DOSED IN MG/24 HOURS) 21 mg/24hr patch Place 1 patch (21 mg total) onto the skin daily.  28 patch  0  . nitroGLYCERIN (NITROSTAT) 0.4 MG SL tablet Place 1 tablet (0.4 mg total) under the tongue every 5 (five) minutes as needed for chest pain.  100 tablet  0  . potassium chloride SA (K-DUR,KLOR-CON) 20 MEQ tablet TAKE 1 TABLET BY MOUTH EVERY DAY  90 tablet  1  . tiotropium (SPIRIVA) 18 MCG inhalation capsule Place 1 capsule (18 mcg total) into inhaler and inhale daily.  90 capsule  3  . UNABLE TO FIND Oxygen 2 liters.      . VENTOLIN HFA 108 (90 BASE) MCG/ACT inhaler INHALE 2 PUFFS EVERY 4-6 HOURS AS NEEDED  18 g  5   No current facility-administered medications for this visit.     Past Surgical History  Procedure Laterality  Date  . Lipoma excision      right lower back  . Partial gastrectomy      approx 1990  . Cardiac surgery       No Known Allergies    Family History  Problem Relation Age of Onset  . Hypertension Brother   . Heart disease Brother     Heart Disease before age 64  . Heart attack Brother   . Cancer Mother   . Diabetes Mother   . Heart disease Mother     Heart Disease before age 108  . Hypertension Mother   . Cancer Father     prostate  . Hypertension Father   . COPD Sister   . Diabetes Sister   . Heart disease Sister   . Hypertension Sister   . Heart attack Sister   . Colon cancer Neg Hx      Social History Tommy Turner reports that he has been smoking Cigarettes.  He has a 27.5 pack-year smoking history. He has never used smokeless tobacco. Tommy Turner reports that he does not drink alcohol.   Review of Systems CONSTITUTIONAL: No weight loss, fever, chills, weakness or  fatigue.  HEENT: Eyes: No visual loss, blurred vision, double vision or yellow sclerae.No hearing loss, sneezing, congestion, runny nose or sore throat.  SKIN: No rash or itching.  CARDIOVASCULAR: per HPI RESPIRATORY: No cough or sputum.  GASTROINTESTINAL: No anorexia, nausea, vomiting or diarrhea. No abdominal pain or blood.  GENITOURINARY: No burning on urination, no polyuria NEUROLOGICAL: No headache, dizziness, syncope, paralysis, ataxia, numbness or tingling in the extremities. No change in bowel or bladder control.  MUSCULOSKELETAL: No muscle, back pain, joint pain or stiffness.  LYMPHATICS: No enlarged nodes. No history of splenectomy.  PSYCHIATRIC: No history of depression or anxiety.  ENDOCRINOLOGIC: No reports of sweating, cold or heat intolerance. No polyuria or polydipsia.  Marland Kitchen   Physical Examination There were no vitals filed for this visit. There were no vitals filed for this visit.  Gen: resting comfortably, no acute distress HEENT: no scleral icterus, pupils equal round and reactive, no palptable cervical adenopathy,  CV Resp: Clear to auscultation bilaterally GI: abdomen is soft, non-tender, non-distended, normal bowel sounds, no hepatosplenomegaly MSK: extremities are warm, no edema.  Skin: warm, no rash Neuro:  no focal deficits Psych: appropriate affect   Diagnostic Studies  Echo 12/2013 Study Conclusions  - Left ventricle: The cavity size was normal. Wall thickness was increased in a pattern of mild LVH. Systolic function was severely reduced. The estimated ejection fraction was in the range of 25% to 30%. There is akinesis of the inferior, inferoseptal, and apical myocardium. There is akinesis of the midanterolateral myocardium. Doppler parameters are consistent with abnormal left ventricular relaxation (grade 1 diastolic dysfunction). - Aortic valve: Mildly calcified annulus. Trileaflet; moderately calcified leaflets. There was overall mild stenosis.  There was trivial regurgitation. Peak gradient (S): 11 mm Hg. Peak velocity ratio of LVOT to aortic valve: 0.43. - Mitral valve: Calcified annulus. Moderately thickened leaflets . There was mild regurgitation. - Left atrium: The atrium was moderately to severely dilated. - Right ventricle: The cavity size was mildly dilated. Systolic function was moderately reduced. - Right atrium: The atrium was mildly dilated. Central venous pressure (est): 8 mm Hg. - Atrial septum: No defect or patent foramen ovale was identified. - Tricuspid valve: There was trivial regurgitation. - Pulmonary arteries: PA peak pressure: 58 mm Hg (S). - Pericardium, extracardiac: There was no pericardial effusion.  Impressions:  - Mild LVH with LVEF approximately 25-30%, wall motion abnormalities consistent with ischemic cardiomyopathy. Grade 1 diastolic dysfunction. Moderate to severe left atrial enlargement. Heavily calcified mitral annulus with mild mitral regurgitation. Moderately calcified aortic valve with evidence of mild aortic stenosis of relatively low gradient. There is trivial aortic regurgitation. Mild RV dilatation with moderately reduced contraction. Mildly dilated right atrium. PASP calculated at 58 mm mercury.    Assessment and Plan  1. CAD/ICM/Chronic systolic HF - from prior notes hx of CABG in 2000 , LVEF  07/2009 at 30-35%  - repeat echo 12/2013, LVEF 25-30%, multiple WMAs - will start low dose Toprol XL today at 12.5mg  daily, low normal heart rate today in clinic. Will follow on medication, counseled on side effects to monitor for .  - volume overload improved since increasing lasix last visit, appears to be euvolemic. Continue 40mg  bid.  - once optimized on medical therapy will need repeat LVEF eval, possible ICD  2. Preoperative evaluation  - would like to have him stabilized on beta blocker prior to surgery - he tolerates <4METs, given his hx of CAD with CABG 15 years ago will need  exercise cardiolite to better help risk stratify for possible vascular surgery   3. AAA - continue to follow with vascular   F/u 2-3 weeks  Arnoldo Lenis, M.D., F.A.C.C.

## 2014-01-07 ENCOUNTER — Other Ambulatory Visit: Payer: Self-pay

## 2014-01-07 ENCOUNTER — Telehealth: Payer: Self-pay | Admitting: *Deleted

## 2014-01-07 DIAGNOSIS — R0602 Shortness of breath: Secondary | ICD-10-CM

## 2014-01-07 DIAGNOSIS — I5022 Chronic systolic (congestive) heart failure: Secondary | ICD-10-CM

## 2014-01-07 NOTE — Telephone Encounter (Signed)
I spoke with Dr.Branch,we will change test to lexiscan  Pt does not need to hold Toprol day of test  I left message on home and cell number

## 2014-01-07 NOTE — Telephone Encounter (Signed)
Dr.Branch,   Which test would you like me to schedule for pt as he feels he can't walk for exercise stress myo? Lexi ?

## 2014-01-07 NOTE — Telephone Encounter (Signed)
Pt though at the time he was seen he could walk for stress test but now he has changed his mind. Can we changes order for tomorrows stress test?

## 2014-01-08 ENCOUNTER — Encounter (HOSPITAL_COMMUNITY): Payer: Self-pay

## 2014-01-08 ENCOUNTER — Encounter (HOSPITAL_COMMUNITY)
Admission: RE | Admit: 2014-01-08 | Discharge: 2014-01-08 | Disposition: A | Discharge status: Home / Self Care | Payer: 59 | Source: Ambulatory Visit | Attending: Cardiology | Admitting: Cardiology

## 2014-01-08 ENCOUNTER — Ambulatory Visit (HOSPITAL_COMMUNITY)
Admission: RE | Admit: 2014-01-08 | Discharge: 2014-01-08 | Disposition: A | Discharge status: Home / Self Care | Payer: 59 | Source: Ambulatory Visit | Attending: Cardiology | Admitting: Cardiology

## 2014-01-08 DIAGNOSIS — I10 Essential (primary) hypertension: Secondary | ICD-10-CM | POA: Diagnosis not present

## 2014-01-08 DIAGNOSIS — Z951 Presence of aortocoronary bypass graft: Secondary | ICD-10-CM | POA: Diagnosis not present

## 2014-01-08 DIAGNOSIS — Z01810 Encounter for preprocedural cardiovascular examination: Secondary | ICD-10-CM | POA: Diagnosis not present

## 2014-01-08 DIAGNOSIS — J4489 Other specified chronic obstructive pulmonary disease: Secondary | ICD-10-CM | POA: Diagnosis not present

## 2014-01-08 DIAGNOSIS — R0602 Shortness of breath: Secondary | ICD-10-CM | POA: Diagnosis not present

## 2014-01-08 DIAGNOSIS — J449 Chronic obstructive pulmonary disease, unspecified: Secondary | ICD-10-CM | POA: Insufficient documentation

## 2014-01-08 DIAGNOSIS — I2510 Atherosclerotic heart disease of native coronary artery without angina pectoris: Secondary | ICD-10-CM | POA: Diagnosis not present

## 2014-01-08 MED ORDER — REGADENOSON 0.4 MG/5ML IV SOLN
0.4000 mg | Freq: Once | INTRAVENOUS | Status: AC | PRN
  Administered 2014-01-08: 0.4 mg via INTRAVENOUS

## 2014-01-08 MED ORDER — REGADENOSON 0.4 MG/5ML IV SOLN
INTRAVENOUS | Status: AC
  Administered 2014-01-08: 0.4 mg via INTRAVENOUS
  Filled 2014-01-08: qty 5

## 2014-01-08 MED ORDER — SODIUM CHLORIDE 0.9 % IJ SOLN
INTRAMUSCULAR | Status: AC
  Administered 2014-01-08: 10 mL via INTRAVENOUS
  Filled 2014-01-08: qty 10

## 2014-01-08 MED ORDER — TECHNETIUM TC 99M SESTAMIBI - CARDIOLITE
30.0000 | Freq: Once | INTRAVENOUS | Status: AC | PRN
  Administered 2014-01-08: 30 via INTRAVENOUS

## 2014-01-08 MED ORDER — SODIUM CHLORIDE 0.9 % IJ SOLN
10.0000 mL | INTRAMUSCULAR | Status: DC | PRN
  Administered 2014-01-08: 10 mL via INTRAVENOUS

## 2014-01-08 MED ORDER — TECHNETIUM TC 99M SESTAMIBI GENERIC - CARDIOLITE
10.0000 | Freq: Once | INTRAVENOUS | Status: AC | PRN
  Administered 2014-01-08: 10 via INTRAVENOUS

## 2014-01-08 NOTE — Progress Notes (Signed)
Stress Lab Nurses Notes - Tommy Turner  Tommy Turner 01/08/2014 Reason for doing test: CAD and Surgical Clearance Type of test: Wille Glaser Nurse performing test: Tommy Halls, RN Nuclear Medicine Tech: Tommy Turner Echo Tech: Not Applicable MD performing test: Tommy Nails NP Family MD: Tommy Turner Test explained and consent signed: Yes.   IV started: Saline lock flushed, No redness or edema and Saline lock started in radiology Symptoms: stomach discomfort Treatment/Intervention: None Reason test stopped: protocol completed After recovery IV was: Discontinued via X-ray tech and No redness or edema Patient to return to Nuc. Med at : 11:00 Patient discharged: Home Patient's Condition upon discharge was: stable Comments: During test BP 128/60 & HR 80.  Recovery BP 118/58 & HR 71.  Symptoms resolved in recovery.  Tommy Turner

## 2014-01-13 ENCOUNTER — Telehealth: Payer: Self-pay | Admitting: *Deleted

## 2014-01-13 NOTE — Telephone Encounter (Signed)
Message copied by Laurine Blazer on Wed Jan 13, 2014 10:02 AM ------      Message from: Trenton F      Created: Tue Jan 12, 2014  4:48 PM       Stress test shows old damage to heart from old blockages, no evidence of new blockages. Continue current meds, keep f/u per clinic note in 2-3 weeks. Discuss results in more detail then and also upcoming surgery            Zandra Abts MD ------

## 2014-01-13 NOTE — Telephone Encounter (Signed)
Notes Recorded by Laurine Blazer, LPN on 01/05/6605 at 0:04 PM Wife [name]) notified. Already has follow up scheduled with Jory Sims, NP in [place] office 01/20/2014.

## 2014-01-13 NOTE — Telephone Encounter (Signed)
Notes Recorded by Laurine Blazer, LPN on 08/12/2705 at 86:75 AM Left message to return call.

## 2014-01-15 ENCOUNTER — Telehealth: Payer: Self-pay | Admitting: Vascular Surgery

## 2014-01-15 NOTE — Telephone Encounter (Signed)
[facility] unwilling to pay for EVAR.  They continue that the aneurysm is only 4.5 cm.  I suspect they have not reviewed the images directly.  3D reconstruction will be need to try to settle the issue.   - I will have the patient follow in the office this Friday to discuss the matter further.  Hopefully, we'll have a definitive answer by then.  Adele Barthel, MD [facility] Office: [phone] Pager: [phone]  01/15/2014, 6:04 PM

## 2014-01-18 ENCOUNTER — Encounter (HOSPITAL_COMMUNITY): Payer: Self-pay | Admitting: Pharmacy Technician

## 2014-01-18 ENCOUNTER — Telehealth: Payer: Self-pay | Admitting: Vascular Surgery

## 2014-01-18 NOTE — Telephone Encounter (Signed)
Message copied by Gena Fray on Mon Jan 18, 2014  2:09 PM ------      Message from: Denman George      Created: Mon Jan 18, 2014 12:56 PM      Regarding: FW: need clarification about next step       Please schedule an office visit this Friday with Dr. Bridgett Larsson; EVAR that is scheduled on 9/22 has been denied coverage by insurance, so need to see pt. in office.         ----- Message -----         From: Conrad [place], MD         Sent: 01/15/2014   6:00 PM           To: Lynetta Mare Pullins, RN      Subject: RE: need clarification about next step                   Schedule the patient for this coming Friday clinic.  I need a 3D reconstruction done to contest the insurance decision.  This is going to take a day or two to get done this coming week.            ----- Message -----         From: Sherrye Payor, RN         Sent: 01/15/2014   4:59 PM           To: Conrad [place], MD      Subject: need clarification about next step                       I am following up to the Peer to Peer review on this pt. done 9/11.  Should we cancel this procedure?  I understood there was a discussion about repeating a scan.  Just let us know what you want to do.              ------

## 2014-01-18 NOTE — Telephone Encounter (Signed)
Spoke with pts wife Tommy Turner to schedule for 01/22/14, dpm

## 2014-01-18 NOTE — Pre-Procedure Instructions (Signed)
[place] CURRENT  01/18/2014   Your procedure is scheduled on:  Tues, Sept 22 @ 8:15 AM  Report to [facility]  at 6:15 AM.  Call this number if you have problems the morning of surgery: [phone]   Remember:   Do not eat food or drink liquids after midnight.   Take these medicines the morning of surgery with A SIP OF WATER: Albuterol<Bring Your Inhaler With You>,Xanax(Alprazolam),Symbicort(Budesonide-Formoterol),Pain Pill(if needed),Metoprolol(Toprol),and Spiriva(Tiopropium)               Stop taking your Aspirin as you have been instructed. No Goody's,BC's,Aleve,Ibuprofen,Fish Oil,or any Herbal Medications   Do not wear jewelry, make-up or nail polish.  Do not wear lotions, powders, or perfumes. You may wear deodorant.  Do not shave 48 hours prior to surgery.   Do not bring valuables to the hospital.  [facility] is not responsible                  for any belongings or valuables.               Contacts, dentures or bridgework may not be worn into surgery.  Leave suitcase in the car. After surgery it may be brought to your room.  For patients admitted to the hospital, discharge time is determined by your                treatment team.                 Special Instructions:  [place] - Preparing for Surgery  Before surgery, you can play an important role.  Because skin is not sterile, your skin needs to be as free of germs as possible.  You can reduce the number of germs on you skin by washing with CHG (chlorahexidine gluconate) soap before surgery.  CHG is an antiseptic cleaner which kills germs and bonds with the skin to continue killing germs even after washing.  Please DO NOT use if you have an allergy to CHG or antibacterial soaps.  If your skin becomes reddened/irritated stop using the CHG and inform your nurse when you arrive at Short Stay.  Do not shave (including legs and underarms) for at least 48 hours prior to the first CHG shower.  You may shave your  face.  Please follow these instructions carefully:   1.  Shower with CHG Soap the night before surgery and the                                morning of Surgery.  2.  If you choose to wash your hair, wash your hair first as usual with your       normal shampoo.  3.  After you shampoo, rinse your hair and body thoroughly to remove the                      Shampoo.  4.  Use CHG as you would any other liquid soap.  You can apply chg directly       to the skin and wash gently with scrungie or a clean washcloth.  5.  Apply the CHG Soap to your body ONLY FROM THE NECK DOWN.        Do not use on open wounds or open sores.  Avoid contact with your eyes,       ears, mouth and genitals (private parts).  Wash genitals (private parts)       with your normal soap.  6.  Wash thoroughly, paying special attention to the area where your surgery        will be performed.  7.  Thoroughly rinse your body with warm water from the neck down.  8.  DO NOT shower/wash with your normal soap after using and rinsing off       the CHG Soap.  9.  Pat yourself dry with a clean towel.            10.  Wear clean pajamas.            11.  Place clean sheets on your bed the night of your first shower and do not        sleep with pets.  Day of Surgery  Do not apply any lotions/deoderants the morning of surgery.  Please wear clean clothes to the hospital/surgery center.     Please read over the following fact sheets that you were given: Pain Booklet, Coughing and Deep Breathing, Blood Transfusion Information, MRSA Information and Surgical Site Infection Prevention

## 2014-01-19 ENCOUNTER — Encounter (HOSPITAL_COMMUNITY): Payer: Self-pay

## 2014-01-19 ENCOUNTER — Encounter (HOSPITAL_COMMUNITY)
Admission: RE | Admit: 2014-01-19 | Discharge: 2014-01-19 | Disposition: A | Discharge status: Home / Self Care | Payer: 59 | Source: Ambulatory Visit | Attending: Vascular Surgery | Admitting: Vascular Surgery

## 2014-01-19 ENCOUNTER — Encounter (HOSPITAL_COMMUNITY)
Admission: RE | Admit: 2014-01-19 | Discharge: 2014-01-19 | Disposition: A | Discharge status: Home / Self Care | Payer: 59 | Source: Ambulatory Visit | Attending: Anesthesiology | Admitting: Anesthesiology

## 2014-01-19 DIAGNOSIS — I714 Abdominal aortic aneurysm, without rupture, unspecified: Secondary | ICD-10-CM | POA: Insufficient documentation

## 2014-01-19 DIAGNOSIS — Z01818 Encounter for other preprocedural examination: Secondary | ICD-10-CM | POA: Diagnosis not present

## 2014-01-19 DIAGNOSIS — Z01812 Encounter for preprocedural laboratory examination: Secondary | ICD-10-CM | POA: Diagnosis not present

## 2014-01-19 DIAGNOSIS — Z01810 Encounter for preprocedural cardiovascular examination: Secondary | ICD-10-CM | POA: Diagnosis not present

## 2014-01-19 DIAGNOSIS — J449 Chronic obstructive pulmonary disease, unspecified: Secondary | ICD-10-CM | POA: Diagnosis not present

## 2014-01-19 HISTORY — DX: Depression, unspecified: F32.A

## 2014-01-19 HISTORY — DX: Major depressive disorder, single episode, unspecified: F32.9

## 2014-01-19 HISTORY — DX: Acute myocardial infarction, unspecified: I21.9

## 2014-01-19 HISTORY — DX: Personal history of other infectious and parasitic diseases: Z86.19

## 2014-01-19 HISTORY — DX: Edema, unspecified: R60.9

## 2014-01-19 HISTORY — DX: Localized edema: R60.0

## 2014-01-19 HISTORY — DX: Anxiety disorder, unspecified: F41.9

## 2014-01-19 LAB — COMPREHENSIVE METABOLIC PANEL
ALK PHOS: 100 U/L (ref 39–117)
ALT: 15 U/L (ref 0–53)
AST: 23 U/L (ref 0–37)
Albumin: 3.8 g/dL (ref 3.5–5.2)
Anion gap: 10 (ref 5–15)
BILIRUBIN TOTAL: 0.5 mg/dL (ref 0.3–1.2)
BUN: 10 mg/dL (ref 6–23)
CHLORIDE: 98 meq/L (ref 96–112)
CO2: 31 mEq/L (ref 19–32)
Calcium: 9.3 mg/dL (ref 8.4–10.5)
Creatinine, Ser: 0.61 mg/dL (ref 0.50–1.35)
GFR calc Af Amer: >90 mL/min (ref >90)
GFR calc non Af Amer: >90 mL/min (ref >90)
Glucose, Bld: 127 mg/dL — ABNORMAL HIGH (ref 70–99)
POTASSIUM: 4.4 meq/L (ref 3.7–5.3)
SODIUM: 139 meq/L (ref 137–147)
TOTAL PROTEIN: 6.6 g/dL (ref 6.0–8.3)

## 2014-01-19 LAB — BLOOD GAS, ARTERIAL
ACID-BASE EXCESS: 8.2 mmol/L — AB (ref 0.0–2.0)
Bicarbonate: 32.7 mEq/L — ABNORMAL HIGH (ref 20.0–24.0)
Drawn by: 421801
FIO2: 0.21 %
O2 SAT: 90.5 %
Patient temperature: 98.6
TCO2: 34.2 mmol/L (ref 0–100)
pCO2 arterial: 49.6 mmHg — ABNORMAL HIGH (ref 35.0–45.0)
pH, Arterial: 7.434 (ref 7.350–7.450)
pO2, Arterial: 59.7 mmHg — ABNORMAL LOW (ref 80.0–100.0)

## 2014-01-19 LAB — CBC
HEMATOCRIT: 48.8 % (ref 39.0–52.0)
Hemoglobin: 15.7 g/dL (ref 13.0–17.0)
MCH: 29.1 pg (ref 26.0–34.0)
MCHC: 32.2 g/dL (ref 30.0–36.0)
MCV: 90.5 fL (ref 78.0–100.0)
Platelets: 138 10*3/uL — ABNORMAL LOW (ref 150–400)
RBC: 5.39 MIL/uL (ref 4.22–5.81)
RDW: 13.9 % (ref 11.5–15.5)
WBC: 7.8 10*3/uL (ref 4.0–10.5)

## 2014-01-19 LAB — URINALYSIS, ROUTINE W REFLEX MICROSCOPIC
Bilirubin Urine: NEGATIVE
Glucose, UA: NEGATIVE mg/dL
HGB URINE DIPSTICK: NEGATIVE
Ketones, ur: NEGATIVE mg/dL
Leukocytes, UA: NEGATIVE
Nitrite: NEGATIVE
PROTEIN: NEGATIVE mg/dL
SPECIFIC GRAVITY, URINE: 1.011 (ref 1.005–1.030)
UROBILINOGEN UA: 1 mg/dL (ref 0.0–1.0)
pH: 7.5 (ref 5.0–8.0)

## 2014-01-19 LAB — TYPE AND SCREEN
ABO/RH(D): A POS
ANTIBODY SCREEN: NEGATIVE

## 2014-01-19 LAB — ABO/RH: ABO/RH(D): A POS

## 2014-01-19 LAB — PROTIME-INR
INR: 1.02 (ref 0.00–1.49)
Prothrombin Time: 13.4 seconds (ref 11.6–15.2)

## 2014-01-19 LAB — APTT: APTT: 26 s (ref 24–37)

## 2014-01-19 LAB — SURGICAL PCR SCREEN
MRSA, PCR: NEGATIVE
Staphylococcus aureus: NEGATIVE

## 2014-01-19 MED ORDER — CHLORHEXIDINE GLUCONATE CLOTH 2 % EX PADS: 6.0000 | MEDICATED_PAD | Freq: Once | CUTANEOUS | Status: DC

## 2014-01-19 NOTE — Progress Notes (Signed)
HPI: Tommy Turner is a 73 year old male patient of Dr. Carlyle Dolly, we are following for ongoing assessment and management of patient with CAD, CABG in 1093, systolic dysfunction, with most recent echocardiogram in August of 2015 revealing LVEF of 25-30%, multiple wall motion abnormalities, grade 1 diastolic dysfunction, with moderate RV dysfunction, history of AAA followed by vascular,with planned surgical intervention by Dr. Bridgett Larsson.   On last office visit with Dr. Harl Bowie on 01/05/2014. Patient was scheduled for a stress Cardiolite, which was completed on 01/08/2014. Metoprolol XL 12.5 mg daily was also started. He was continued on Lasix 40 mg twice a day as there was no evidence of volume overload.   Stress Test Results IMPRESSION:  1. Large area of scar in both the apex and also the inferolateral  wall. There is no significant peri-infarct ischemia.  2. Severely reduced left ventricular systolic function. The LV is  enlarged  3. Left ventricular ejection fraction 24%  4. High-risk stress test findings due to severe left ventricular  systolic dysfunction and large area of scar. No current myocardium  at jeopardy*.  Echocardiogram  12/29/2013 LVEF 25% to 30%, multiple wall motion abnormalities, grade 1 diastolic dysfunction, moderate RV dysfunction.  No Known Allergies  Current Outpatient Prescriptions  Medication Sig Dispense Refill  . albuterol (PROVENTIL HFA;VENTOLIN HFA) 108 (90 BASE) MCG/ACT inhaler Inhale 2 puffs into the lungs every 4 (four) hours as needed for wheezing or shortness of breath.      Marland Kitchen albuterol (PROVENTIL) (2.5 MG/3ML) 0.083% nebulizer solution Take 2.5 mg by nebulization every 6 (six) hours as needed for wheezing or shortness of breath.      . ALPRAZolam (XANAX) 1 MG tablet Take 0.5-1 mg by mouth 3 (three) times daily as needed for anxiety (Takes one-half tablet in the morning and afternoon. takes one tablet at bedtime daily).      Marland Kitchen aspirin 81 MG tablet Take  81 mg by mouth every morning.       Marland Kitchen atorvastatin (LIPITOR) 80 MG tablet Take 1 tablet (80 mg total) by mouth daily.  30 tablet  6  . benazepril (LOTENSIN) 40 MG tablet Take 40 mg by mouth daily.      . budesonide-formoterol (SYMBICORT) 160-4.5 MCG/ACT inhaler Inhale 2 puffs into the lungs 2 (two) times daily.  1 Inhaler  0  . citalopram (CELEXA) 20 MG tablet Take 20 mg by mouth at bedtime.      . furosemide (LASIX) 40 MG tablet Take 1 tablet (40 mg total) by mouth 2 (two) times daily.  60 tablet  6  . HYDROcodone-acetaminophen (NORCO) 10-325 MG per tablet Take 1 tablet by mouth 2 (two) times daily as needed (pain).  60 tablet  0  . metoprolol succinate (TOPROL-XL) 25 MG 24 hr tablet Take 0.5 tablets (12.5 mg total) by mouth daily.  45 tablet  3  . nitroGLYCERIN (NITROSTAT) 0.4 MG SL tablet Place 1 tablet (0.4 mg total) under the tongue every 5 (five) minutes as needed for chest pain.  100 tablet  0  . potassium chloride SA (K-DUR,KLOR-CON) 20 MEQ tablet Take 20 mEq by mouth daily.      Marland Kitchen tiotropium (SPIRIVA) 18 MCG inhalation capsule Place 1 capsule (18 mcg total) into inhaler and inhale daily.  90 capsule  3  . UNABLE TO FIND Oxygen 2 liters.       No current facility-administered medications for this visit.    Past Medical History  Diagnosis Date  . AAA (abdominal  aortic aneurysm)   . Hyperlipidemia     takes Atorvastatin daily  . CAD (coronary artery disease)   . Ulcer     gastric  . Aneurysm   . Anxiety     takes Xanax daily as needed  . Depression     takes Celexa daily  . Hypertension     takes Metoprolol and Benazepril  daily  . Peripheral edema     takes Furosemide daily  . Myocardial infarction     20+yrs ago  . COPD (chronic obstructive pulmonary disease)     Albuterol inhaler and neb daily as needed;takes SPiriva daily  . Shortness of breath     with exertion  . Headache(784.0)     occasionally  . History of shingles     Past Surgical History  Procedure  Laterality Date  . Lipoma excision      right lower back  . Partial gastrectomy      approx 1990  . Coronary artery bypass graft  20+yrs ago    x 6  . Hernia repair Left     inguinal  . Tonsillectomy      as a child  . Esophagogastroduodenoscopy      ROS: Review of systems complete and found to be negative unless listed above  PHYSICAL EXAM BP 110/68  Pulse 65  Ht 5\' 7"  (1.702 m)  Wt 152 lb (68.947 kg)  BMI 23.80 kg/m2  SpO2 99% General: Well developed, well nourished, in no acute distress Head: Eyes PERRLA, No xanthomas.   Normal cephalic and atramatic  Lungs: Some crackles in the bases. Heart: HRRR S1 S2, without MRG.  Pulses are 2+ & equal.            No carotid bruit. No JVD.  No abdominal bruits. No femoral bruits. Abdomen: Bowel sounds are positive, abdomen soft and non-tender without masses or                  Hernia's noted. Msk:  Back normal, normal gait. Normal strength and tone for age. Extremities: No clubbing, cyanosis or edema.  DP +1 Neuro: Alert and oriented X 3. Psych:  Good affect, responds appropriately   ASSESSMENT AND PLAN

## 2014-01-19 NOTE — Progress Notes (Addendum)
Dr.Branch is cardiologist with last visit in epic from 01-05-14  Echo report in epic from 2011/2015  Stress test in epic from 2015  Denies ever having a heart cath  Medical Md is Dr.Scott [place]  EKG in epic from 04-05-13  Denies CXR in past yr

## 2014-01-20 ENCOUNTER — Encounter: Payer: Self-pay | Admitting: Adult Health

## 2014-01-20 ENCOUNTER — Other Ambulatory Visit: Payer: Self-pay | Admitting: *Deleted

## 2014-01-20 ENCOUNTER — Encounter (HOSPITAL_COMMUNITY): Payer: Self-pay | Admitting: Emergency Medicine

## 2014-01-20 ENCOUNTER — Encounter: Payer: Self-pay | Admitting: *Deleted

## 2014-01-20 ENCOUNTER — Ambulatory Visit (INDEPENDENT_AMBULATORY_CARE_PROVIDER_SITE_OTHER): Payer: 59 | Admitting: Adult Health

## 2014-01-20 VITALS — BP 110/68 | HR 65 | Ht 67.0 in | Wt 152.0 lb

## 2014-01-20 DIAGNOSIS — I714 Abdominal aortic aneurysm, without rupture, unspecified: Secondary | ICD-10-CM | POA: Diagnosis not present

## 2014-01-20 DIAGNOSIS — I2510 Atherosclerotic heart disease of native coronary artery without angina pectoris: Secondary | ICD-10-CM

## 2014-01-20 DIAGNOSIS — F17200 Nicotine dependence, unspecified, uncomplicated: Secondary | ICD-10-CM | POA: Diagnosis not present

## 2014-01-20 MED ORDER — POTASSIUM CHLORIDE CRYS ER 20 MEQ PO TBCR: 20.0000 meq | EXTENDED_RELEASE_TABLET | Freq: Every day | ORAL | Status: DC

## 2014-01-20 NOTE — Patient Instructions (Signed)
Your physician recommends that you schedule a follow-up appointment in: 1 month post operative with Dr. Harl Bowie  Your physician recommends that you continue on your current medications as directed. Please refer to the Current Medication list given to you today.  We have given you a release letter for your upcomming surgery and will send today's office visit to Dr. Bridgett Larsson  Thank you for choosing [facility]!!

## 2014-01-20 NOTE — Assessment & Plan Note (Signed)
Stress test completed as discussed above. No new areas of ischemia. There no current myocardium at jeopardy. Continue current medication regimen, and risk management. He is completely asymptomatic.

## 2014-01-20 NOTE — Progress Notes (Addendum)
Anesthesia Chart Review:  Pt is 73 year old male scheduled for abdominal aortic endovascular stent graft on 01/26/14 with Dr. Bridgett Larsson.   PMH: CAD, MI, AAA, CABG 2000, COPD, HTN, peripheral edema, hyperlipidemia, anxiety, depression. 58 pack year smoker.   Medications include: ASA, metoprolol, nitroglycerin, lasix, benazepril, albuterol, spiriva, symbicort, atorvastatin, celexa, xanax.   O2 sat 95-99%.  Preoperative labs reviewed.  ABG is abnormal- reviewed with Dr. Tobias Alexander. Platelets are low at 138; previous platelets over past year 140, 154, 116, 120.  Chest x-ray reviewed. Stable COPD. No active disease.   EKG 04/05/2013: Sinus rhythm with 1st degree AV block. Biatrial enlargement. Nonspecific intraventricular block. Lateral infarct (age undetermined).   2D echo 12/29/13:  Mild LVH with LVEF approximately 25-30%, wall motion abnormalities consistent with ischemic cardiomyopathy. Grade 1 diastolic dysfunction. Moderate to severe left atrial enlargement. Heavily calcified mitral annulus with mild mitral regurgitation. Moderately calcified aortic valve with evidence of mild aortic stenosis of relatively low gradient. There is trivial aortic regurgitation. Mild RV dilatation with moderately reduced contraction. Mildly dilated right atrium. PASP calculated at 78mm mercury.  Stress Test 01/08/14: IMPRESSION:  1. Large area of scar in both the apex and also the inferolateral  wall. There is no significant peri-infarct ischemia.  2. Severely reduced left ventricular systolic function. The LV is  enlarged  3. Left ventricular ejection fraction 24%  4. High-risk stress test findings due to severe left ventricular  systolic dysfunction and large area of scar. No current myocardium at jeopardy  Note 01/20/14 by Jory Sims, NP in Dr. Nelly Laurence office: "The patient is an acceptable cardiac risk to proceed with AAA repair. It is recommended that he continue the Toprol call 12.5 mg twice a day  perioperatively, along with ACE inhibitor and diuretic. Watching closely for fluid overload in the setting of significant systolic dysfunction."  If no changes, I anticipate pt can proceed with surgery as scheduled.   Willeen Cass, FNP-BC [facility] Short Stay Surgical Center/Anesthesiology Phone: [phone] 01/21/2014 1:31 PM

## 2014-01-20 NOTE — Assessment & Plan Note (Signed)
I have spoken with the patient concerning tobacco cessation. I have explained to him. This will affect his perioperative recovery, and overall maintenance of health if he continues to smoke. I have advised him that while he is in the hospital. He will not be allowed to smoke, and would recommend that he do not restart on discharge. He verbalizes understanding.

## 2014-01-20 NOTE — Progress Notes (Deleted)
Name: Tommy Turner    DOB: 08/20/1940  Age: 73 y.o.  MR#: [id number]       PCP:  Sallee Lange, MD      Insurance: Payor: [employer] EM[employer]OYEE / Plan: [place] [employer] / Product Type: *No Product type* /   CC:    Chief Complaint  Patient presents with  . Coronary Artery Disease  . Cardiomyopathy    VS Filed Vitals:   01/20/14 1307  BP: 110/68  Pulse: 65  Height: 5\' 7"  (1.702 m)  Weight: 152 lb (68.947 kg)  SpO2: 99%    Weights Current Weight  01/20/14 152 lb (68.947 kg)  01/19/14 152 lb 8 oz (69.174 kg)  01/05/14 150 lb (68.04 kg)    Blood Pressure  BP Readings from Last 3 Encounters:  01/20/14 110/68  01/19/14 109/53  01/05/14 128/68     Admit date:  (Not on file) Last encounter with RMR:  Visit date not found   Allergy Review of patient's allergies indicates no known allergies.  Current Outpatient Prescriptions  Medication Sig Dispense Refill  . albuterol (PROVENTIL HFA;VENTOLIN HFA) 108 (90 BASE) MCG/ACT inhaler Inhale 2 puffs into the lungs every 4 (four) hours as needed for wheezing or shortness of breath.      Marland Kitchen albuterol (PROVENTIL) (2.5 MG/3ML) 0.083% nebulizer solution Take 2.5 mg by nebulization every 6 (six) hours as needed for wheezing or shortness of breath.      . ALPRAZolam (XANAX) 1 MG tablet Take 0.5-1 mg by mouth 3 (three) times daily as needed for anxiety (Takes one-half tablet in the morning and afternoon. takes one tablet at bedtime daily).      Marland Kitchen aspirin 81 MG tablet Take 81 mg by mouth every morning.       Marland Kitchen atorvastatin (LIPITOR) 80 MG tablet Take 1 tablet (80 mg total) by mouth daily.  30 tablet  6  . benazepril (LOTENSIN) 40 MG tablet Take 40 mg by mouth daily.      . budesonide-formoterol (SYMBICORT) 160-4.5 MCG/ACT inhaler Inhale 2 puffs into the lungs 2 (two) times daily.  1 Inhaler  0  . citalopram (CELEXA) 20 MG tablet Take 20 mg by mouth at bedtime.      . furosemide (LASIX) 40 MG tablet Take 1 tablet (40 mg total) by mouth 2 (two) times  daily.  60 tablet  6  . HYDROcodone-acetaminophen (NORCO) 10-325 MG per tablet Take 1 tablet by mouth 2 (two) times daily as needed (pain).  60 tablet  0  . metoprolol succinate (TOPROL-XL) 25 MG 24 hr tablet Take 0.5 tablets (12.5 mg total) by mouth daily.  45 tablet  3  . nitroGLYCERIN (NITROSTAT) 0.4 MG SL tablet Place 1 tablet (0.4 mg total) under the tongue every 5 (five) minutes as needed for chest pain.  100 tablet  0  . potassium chloride SA (K-DUR,KLOR-CON) 20 MEQ tablet Take 20 mEq by mouth daily.      Marland Kitchen tiotropium (SPIRIVA) 18 MCG inhalation capsule Place 1 capsule (18 mcg total) into inhaler and inhale daily.  90 capsule  3  . UNABLE TO FIND Oxygen 2 liters.       No current facility-administered medications for this visit.    Discontinued Meds:   There are no discontinued medications.  Patient Active Problem List   Diagnosis Date Noted  . Popliteal artery aneurysm 12/07/2013  . Chronic pain syndrome 10/27/2013  . Hyperglycemia 04/09/2013  . Acute respiratory failure with hypoxia 04/09/2013  . COPD  with acute exacerbation 04/05/2013  . COPD exacerbation 04/05/2013  . Loss of weight 03/18/2013  . Early satiety 03/18/2013  . Dysphagia, unspecified(787.20) 03/18/2013  . COPD (chronic obstructive pulmonary disease) 10/31/2012  . Hyperlipemia 10/31/2012  . CAD (coronary artery disease) 10/31/2012  . Anxiety 10/31/2012  . Abdominal aneurysm without mention of rupture 02/02/2011  . TOBACCO ABUSE 09/20/2009  . PEPTIC ULCER DISEASE 09/20/2009  . HYPERTENSION 09/19/2009  . BRONCHITIS, CHRONIC 09/19/2009    LABS    Component Value Date/Time   NA 139 01/19/2014 1042   NA 139 11/12/2013 0855   NA 142 04/11/2013 0653   K 4.4 01/19/2014 1042   K 4.7 11/12/2013 0855   K 4.8 04/11/2013 0653   CL 98 01/19/2014 1042   CL 99 11/12/2013 0855   CL 92* 04/11/2013 0653   CO2 31 01/19/2014 1042   CO2 32 11/12/2013 0855   CO2 42* 04/11/2013 0653   GLUCOSE 127* 01/19/2014 1042   GLUCOSE 106*  11/12/2013 0855   GLUCOSE 168* 04/11/2013 0653   BUN 10 01/19/2014 1042   BUN 11 11/12/2013 0855   BUN 13 04/11/2013 0653   CREATININE 0.61 01/19/2014 1042   CREATININE 0.56 11/12/2013 0855   CREATININE 0.58 04/11/2013 0653   CREATININE 0.57 04/10/2013 0625   CREATININE 0.69 02/24/2013 0753   CALCIUM 9.3 01/19/2014 1042   CALCIUM 9.5 11/12/2013 0855   CALCIUM 10.0 04/11/2013 0653   GFRNONAA >90 01/19/2014 1042   GFRNONAA >90 04/11/2013 0653   GFRNONAA >90 04/10/2013 0625   GFRAA >90 01/19/2014 1042   GFRAA >90 04/11/2013 0653   GFRAA >90 04/10/2013 0625   CMP     Component Value Date/Time   NA 139 01/19/2014 1042   K 4.4 01/19/2014 1042   CL 98 01/19/2014 1042   CO2 31 01/19/2014 1042   GLUCOSE 127* 01/19/2014 1042   BUN 10 01/19/2014 1042   CREATININE 0.61 01/19/2014 1042   CREATININE 0.56 11/12/2013 0855   CALCIUM 9.3 01/19/2014 1042   PROT 6.6 01/19/2014 1042   ALBUMIN 3.8 01/19/2014 1042   AST 23 01/19/2014 1042   ALT 15 01/19/2014 1042   ALKPHOS 100 01/19/2014 1042   BILITOT 0.5 01/19/2014 1042   GFRNONAA >90 01/19/2014 1042   GFRAA >90 01/19/2014 1042       Component Value Date/Time   WBC 7.8 01/19/2014 1042   WBC 7.2 11/12/2013 0855   WBC 8.5 04/09/2013 0524   HGB 15.7 01/19/2014 1042   HGB 15.9 11/12/2013 0855   HGB 15.1 04/09/2013 0524   HGB 16.7 03/12/2013 0946   HCT 48.8 01/19/2014 1042   HCT 47.6 11/12/2013 0855   HCT 48.5 04/09/2013 0524   MCV 90.5 01/19/2014 1042   MCV 87.8 11/12/2013 0855   MCV 96.6 04/09/2013 0524    Lipid Panel     Component Value Date/Time   CHOL 141 11/12/2013 0855   TRIG 51 11/12/2013 0855   HDL 47 11/12/2013 0855   CHOLHDL 3.0 11/12/2013 0855   VLDL 10 11/12/2013 0855   LDLCALC 84 11/12/2013 0855    ABG    Component Value Date/Time   PHART 7.434 01/19/2014 1042   PCO2ART 49.6* 01/19/2014 1042   PO2ART 59.7* 01/19/2014 1042   HCO3 32.7* 01/19/2014 1042   TCO2 34.2 01/19/2014 1042   O2SAT 90.5 01/19/2014 1042     No results found for this basename: TSH   BNP (last 3  results) No results found for this basename: PROBNP,  in  the last 8760 hours Cardiac Panel (last 3 results) No results found for this basename: CKTOTAL, CKMB, TROPONINI, RELINDX,  in the last 72 hours  Iron/TIBC/Ferritin/ %Sat No results found for this basename: iron, tibc, ferritin, ironpctsat     EKG Orders placed during the hospital encounter of 04/05/13  . ED EKG  . ED EKG  . EKG 12-LEAD  . EKG 12-LEAD  . EKG     Prior Assessment and Plan Problem List as of 01/20/2014     Cardiovascular and Mediastinum   HYPERTENSION   CAD (coronary artery disease)   Abdominal aneurysm without mention of rupture   Popliteal artery aneurysm     Respiratory   COPD (chronic obstructive pulmonary disease)   Last Assessment & Plan   10/30/2012 Office Visit Written 10/31/2012 12:41 PM by Nilda Simmer, NP     Stable.  Continue current regimen. Patient does not feel he can stop smoking at this point due to his level of stress.    BRONCHITIS, CHRONIC   COPD with acute exacerbation   COPD exacerbation   Acute respiratory failure with hypoxia     Digestive   PEPTIC ULCER DISEASE   Dysphagia, unspecified(787.20)   Last Assessment & Plan   03/18/2013 Office Visit Written 03/18/2013 11:48 AM by Orvil Feil, NP     EGD/ED as planned.       Other   Hyperlipemia   Anxiety   Last Assessment & Plan   10/30/2012 Office Visit Written 10/31/2012 12:40 PM by Nilda Simmer, NP     Plan: Stop Wellbutrin. Switched to Celexa 20 mg a half tab by mouth daily x6 days then 1 by mouth daily. DC med and call if any adverse effects. Change Xanax to 1 mg half in the morning, half in the afternoon and 1 at bedtime as needed. Patient has several family members who has addiction problems. Reminded patient to keep his medications hidden. Recheck here in one month, call back sooner if any problems.    TOBACCO ABUSE   Loss of weight   Last Assessment & Plan   03/18/2013 Office Visit Written 03/18/2013 11:48 AM  by Orvil Feil, NP     73 year old male presents today with steady weight loss over the past 2 years of close to 20 lbs, 10 of those in the last few months. Associated symptoms include early satiety and vague esophageal dysphagia. Denies any GERD symptoms, rectal bleeding, melena, abdominal pain, or change in bowel habits. CT chest negative, Hgb normal through PCP office.   As he has never had a colonoscopy, needs screening colonoscopy now and upper EGD as well to assess for occult malignancy. Notably, he does have a history of PUD, requiring gastrectomy in the 1990s. Op notes requested from [facility]. Dysphagia may be secondary to web, ring, or stricture. Unable to exclude malignancy. If EGD/TCS negative, proceed with CT of abdomen.   Proceed with TCS/EGD/ED with Dr. Gala Romney in near future: the risks, benefits, and alternatives have been discussed with the patient in detail. The patient states understanding and desires to proceed.     Early satiety   Last Assessment & Plan   03/18/2013 Office Visit Written 03/18/2013 11:48 AM by Orvil Feil, NP     Unclear etiology. EGD/ED as planned.     Hyperglycemia   Chronic pain syndrome       Imaging: Dg Chest 2 View  01/19/2014   CLINICAL DATA:  Pre op for abdominal aortic  aneurysm surgery  EXAM: CHEST  2 VIEW  COMPARISON:  04/05/2013  FINDINGS: Cardiomediastinal silhouette is stable. Status post CABG. No acute infiltrate or pleural effusion. No pulmonary edema. Hyperinflation again noted. Stable degenerative changes thoracic spine.  IMPRESSION: Stable COPD.  Status post CABG.  No active disease.   Electronically Signed   By: Lahoma Crocker M.D.   On: 01/19/2014 12:43   Nm Myocar Multi W/spect W/wall Motion / Ef  01/08/2014   CLINICAL DATA:  73 year old male with a known history of coronary artery disease referred for shortness of breath  EXAM: MYOCARDIAL IMAGING WITH SPECT (REST AND PHARMACOLOGIC-STRESS)  GATED LEFT VENTRICULAR WALL MOTION STUDY  LEFT  VENTRICULAR EJECTION FRACTION  TECHNIQUE: Standard myocardial SPECT imaging was performed after resting intravenous injection of 10 mCi Tc-44m sestamibi. Subsequently, intravenous infusion of Lexiscan was performed under the supervision of the Cardiology staff. At peak effect of the drug, 30 mCi Tc-66m sestamibi was injected intravenously and standard myocardial SPECT imaging was performed. Quantitative gated imaging was also performed to evaluate left ventricular wall motion, and estimate left ventricular ejection fraction.  COMPARISON:  None.  FINDINGS: Pharmacological stress  Baseline EKG showed sinus rhythm with Q waves in the lateral limb and also the anteroseptal precordial leads. After injection heart rate increased to 60 beats per min up to 80 beats per min and blood pressure increased from 120/58 up to 128/60. The test was stopped after injection was complete, the patient did not experience any chest pain. The post-injection EKG showed no specific ischemic changes and no significant arrhythmias.  Perfusion: There is a large severe intensity fixed apical defect consistent with scar. There is a large moderate intensity fixed inferolateral wall defect consistent with scar.  Wall Motion: There is severe global hypokinesis. The apex and inferior wall are akinetic.  Left Ventricular Ejection Fraction: 24 %  End diastolic volume 944 ml  End systolic volume 967 ml  IMPRESSION: 1. Large area of scar in both the apex and also the inferolateral wall. There is no significant peri-infarct ischemia.  2. Severely reduced left ventricular systolic function. The LV is enlarged  3. Left ventricular ejection fraction 24%  4. High-risk stress test findings due to severe left ventricular systolic dysfunction and large area of scar. No current myocardium at jeopardy*.  *2012 Appropriate Use Criteria for Coronary Revascularization Focused Update: J Am Coll Cardiol. 5916;38(4):665-993.  http://content.airportbarriers.com.aspx?articleid=[email or web address]   Electronically Signed   By: Carlyle Dolly   On: 01/08/2014 15:13

## 2014-01-20 NOTE — Assessment & Plan Note (Signed)
Stress Myoview, which was completed on 01/08/2014, as discussed above. He continues to have severe global hypokinesis with an LVEF of 24% per nuclear medicine gating. The patient is an acceptable cardiac risk to proceed with AAA repair. It is recommended that he continue the Toprol call 12.5  mg twice a day perioperatively, along with ACE inhibitor and diuretic. Watching closely for fluid overload in the setting of significant systolic dysfunction.  We are available for consultation perioperatively to assist with care for ongoing cardiac management. A letter has been sent as well from our office to Dr. Bridgett Larsson. The patient has an appointment with him in 2 days or discussion of surgery. Please feel free to call our office for any questions, as stated, we are available at [facility] for any cardiac issues. This has been explained to the patient verbalizes understanding. We will see him postoperatively in our office on followup

## 2014-01-21 ENCOUNTER — Encounter: Payer: Self-pay | Admitting: Vascular Surgery

## 2014-01-22 ENCOUNTER — Ambulatory Visit (INDEPENDENT_AMBULATORY_CARE_PROVIDER_SITE_OTHER): Payer: Self-pay | Admitting: Vascular Surgery

## 2014-01-22 ENCOUNTER — Encounter: Payer: Self-pay | Admitting: Vascular Surgery

## 2014-01-22 VITALS — BP 123/64 | HR 79 | Resp 18 | Ht 68.0 in | Wt 152.0 lb

## 2014-01-22 DIAGNOSIS — I724 Aneurysm of artery of lower extremity: Secondary | ICD-10-CM

## 2014-01-22 DIAGNOSIS — I739 Peripheral vascular disease, unspecified: Secondary | ICD-10-CM | POA: Insufficient documentation

## 2014-01-22 DIAGNOSIS — I714 Abdominal aortic aneurysm, without rupture, unspecified: Secondary | ICD-10-CM

## 2014-01-22 NOTE — Addendum Note (Signed)
Addended by: Dorthula Rue L on: 01/22/2014 02:52 PM   Modules accepted: Orders

## 2014-01-22 NOTE — Progress Notes (Signed)
    I had pt come into today to discuss recent peer-to-peer discussion with UMR case review.  Essentially, the [facility] reviewer had already decided against proceeding with the EVAR.  From his discussion, I could tell he had not reviewed the actual images of the recent CTA.  I do not agree with the radiologist that the aneurysm is only 4.5 cm.  I think a 3D reconstruction may give a 4.8-4.9 cm measurement at the distal segment where the maximal diameter is anyway.  The patient continues to be distressed and anxious to have his AAA repaired.    - Will have the CTA loaded into [place] for an orthogonal measurement. - If the measurements are more consistent with my expectation, will submitted documentation for further review  - I again reiterated I expect this patient is still going to need to be repaired in the next 6-12 months anyway so it seems rather petty to make him wait he is still anxious - Additionally his aneurysm is growing ~0.5 cm/6 mon based on duplex measurements, so he meets growth criteria anyway  Adele Barthel, MD [facility]: [phone] Pager: [phone]  01/22/2014, 12:39 PM

## 2014-01-26 ENCOUNTER — Inpatient Hospital Stay (HOSPITAL_COMMUNITY): Admission: RE | Admit: 2014-01-26 | Payer: 59 | Source: Ambulatory Visit | Admitting: Vascular Surgery

## 2014-01-26 ENCOUNTER — Encounter (HOSPITAL_COMMUNITY): Admission: RE | Payer: Self-pay | Source: Ambulatory Visit

## 2014-01-26 SURGERY — INSERTION, ENDOVASCULAR STENT GRAFT, AORTA, ABDOMINAL
Anesthesia: General

## 2014-02-17 ENCOUNTER — Other Ambulatory Visit: Payer: Self-pay | Admitting: Family Medicine

## 2014-02-24 ENCOUNTER — Telehealth: Payer: Self-pay | Admitting: Family Medicine

## 2014-02-24 MED ORDER — HYDROCODONE-ACETAMINOPHEN 10-325 MG PO TABS: 1.0000 | ORAL_TABLET | Freq: Two times a day (BID) | ORAL | Status: DC | PRN

## 2014-02-24 NOTE — Telephone Encounter (Signed)
Rx up front for patient pick up. Patient notified and scheduled appt. for follow up on pain meds.

## 2014-02-24 NOTE — Telephone Encounter (Signed)
Last seen 10/2013

## 2014-02-24 NOTE — Telephone Encounter (Signed)
He typically gets 60 pills. He may have a refill but mandatory office visit within 2-3 weeks. Unfortunately many people don't understand that this is a controlled medicine that requires visits every 3 months

## 2014-02-24 NOTE — Telephone Encounter (Signed)
Patient needs Rx for HYDROcodone-acetaminophen (NORCO) 10-325 MG per tablet.

## 2014-03-09 ENCOUNTER — Ambulatory Visit (INDEPENDENT_AMBULATORY_CARE_PROVIDER_SITE_OTHER): Payer: 59 | Admitting: *Deleted

## 2014-03-09 ENCOUNTER — Ambulatory Visit (INDEPENDENT_AMBULATORY_CARE_PROVIDER_SITE_OTHER): Payer: 59 | Admitting: Family Medicine

## 2014-03-09 ENCOUNTER — Encounter: Payer: Self-pay | Admitting: Family Medicine

## 2014-03-09 VITALS — BP 128/70 | Ht 67.0 in | Wt 155.6 lb

## 2014-03-09 DIAGNOSIS — Z0289 Encounter for other administrative examinations: Secondary | ICD-10-CM

## 2014-03-09 DIAGNOSIS — R972 Elevated prostate specific antigen [PSA]: Secondary | ICD-10-CM

## 2014-03-09 DIAGNOSIS — I714 Abdominal aortic aneurysm, without rupture, unspecified: Secondary | ICD-10-CM

## 2014-03-09 DIAGNOSIS — E785 Hyperlipidemia, unspecified: Secondary | ICD-10-CM

## 2014-03-09 DIAGNOSIS — I2510 Atherosclerotic heart disease of native coronary artery without angina pectoris: Secondary | ICD-10-CM

## 2014-03-09 DIAGNOSIS — I10 Essential (primary) hypertension: Secondary | ICD-10-CM

## 2014-03-09 DIAGNOSIS — Z23 Encounter for immunization: Secondary | ICD-10-CM

## 2014-03-09 DIAGNOSIS — G894 Chronic pain syndrome: Secondary | ICD-10-CM

## 2014-03-09 DIAGNOSIS — R0902 Hypoxemia: Secondary | ICD-10-CM

## 2014-03-09 DIAGNOSIS — Z139 Encounter for screening, unspecified: Secondary | ICD-10-CM

## 2014-03-09 DIAGNOSIS — J439 Emphysema, unspecified: Secondary | ICD-10-CM

## 2014-03-09 MED ORDER — HYDROCODONE-ACETAMINOPHEN 10-325 MG PO TABS: 1.0000 | ORAL_TABLET | Freq: Two times a day (BID) | ORAL | Status: DC | PRN

## 2014-03-09 NOTE — Progress Notes (Signed)
Subjective:    Patient ID: Tommy Turner, male    DOB: July 17, 1940, 73 y.o.   MRN: [id number]  Hypertension This is a chronic problem. The current episode started more than 1 year ago. Associated symptoms include shortness of breath. Pertinent negatives include no chest pain or palpitations. Compliance problems include exercise.    Wife needs FMLA filled out so she can go with pt to his doctor appts. She dropped papers off today.  This patient was seen today for chronic pain  The medication list was reviewed and updated.   -Compliance with pain medication: yes  The patient was advised the importance of maintaining medication and not using illegal substances with these.  Refills needed: yes  The patient was educated that we can provide 3 monthly scripts for their medication, it is their responsibility to follow the instructions.  Side effects or complications from medications: none  Patient is aware that pain medications are meant to minimize the severity of the pain to allow their pain levels to improve to allow for better function. They are aware of that pain medications cannot totally remove their pain.  Due for UDT ( at least once per year) : next visit  Long discussion was held regarding his aortic aneurysm, COPD, hypertension, hyperlipidemia, smoking in the need to quit smoking and the fact that he needs a colonoscopy but cannot get it done currently greater than half the time was spent in discussion 40 minutes   Painful spot on bottom. Hurts off and on. Has been there for awhile.    Review of Systems  Constitutional: Negative for fever, activity change, appetite change and fatigue.  HENT: Negative for congestion.   Respiratory: Positive for shortness of breath. Negative for cough and wheezing.   Cardiovascular: Negative for chest pain, palpitations and leg swelling.  Gastrointestinal: Negative for abdominal pain.  Endocrine: Negative for polydipsia and polyphagia.    Neurological: Negative for dizziness and weakness.  Psychiatric/Behavioral: Negative for confusion.       Objective:   Physical Exam  Constitutional: He appears well-nourished. No distress.  HENT:  Head: Normocephalic.  Neck: Normal range of motion.  Cardiovascular: Normal rate, regular rhythm and normal heart sounds.   No murmur heard. Pulmonary/Chest: Effort normal and breath sounds normal. No respiratory distress.  Musculoskeletal: He exhibits no edema.  Lymphadenopathy:    He has no cervical adenopathy.  Neurological: He is alert.  Psychiatric: His behavior is normal.  Vitals reviewed.         Assessment & Plan:  #1 HTN decent control continue current measures #2 COPD he is using his medications as redirected. He does have hypoxia I encouraged him uses oxygen more often patient states he uses it periodically #3 patient cannot undergo colonoscopy currently because of his aortic aneurysm we will do Hemoccult cards 3 #4 chronic pain-prescriptions were given he follows him as directed denies any side effects follow-up 3 months #5patient has history of elevated PSA we will check a PSA level he is following up with [facility] later in December #6 hyperlipidemia check lipid profile await results #7 pneumonia vaccine today #8 because of his medications check metabolic 7 #9 moods-Xanax as necessary for anxiety Celexa I did recommend using a half tablet every single day #10 aortic aneurysm-patient states his vascular surgeon currently is trying to get his surgery approved. Certainly if he cannot get it approved he will need a follow-up ultrasound. In my opinion I believe this patient would benefit from having  the surgery proposed. Finally I did educate him and his wife about the importance of going to the ER if severe sudden back or abdominal pain #11-FMLA papers will be filled out for his wife.

## 2014-03-13 LAB — BASIC METABOLIC PANEL
BUN: 11 mg/dL (ref 6–23)
CO2: 30 meq/L (ref 19–32)
CREATININE: 0.52 mg/dL (ref 0.50–1.35)
Calcium: 9.1 mg/dL (ref 8.4–10.5)
Chloride: 93 mEq/L — ABNORMAL LOW (ref 96–112)
Glucose, Bld: 101 mg/dL — ABNORMAL HIGH (ref 70–99)
Potassium: 4.3 mEq/L (ref 3.5–5.3)
Sodium: 133 mEq/L — ABNORMAL LOW (ref 135–145)

## 2014-03-13 LAB — LIPID PANEL
CHOL/HDL RATIO: 2.2 ratio
CHOLESTEROL: 119 mg/dL (ref 0–200)
HDL: 55 mg/dL (ref >39)
LDL Cholesterol: 54 mg/dL (ref 0–99)
Triglycerides: 52 mg/dL (ref <150)
VLDL: 10 mg/dL (ref 0–40)

## 2014-03-13 LAB — HEPATIC FUNCTION PANEL
ALT: 12 U/L (ref 0–53)
AST: 19 U/L (ref 0–37)
Albumin: 3.9 g/dL (ref 3.5–5.2)
Alkaline Phosphatase: 87 U/L (ref 39–117)
Bilirubin, Direct: 0.2 mg/dL (ref 0.0–0.3)
Indirect Bilirubin: 0.7 mg/dL (ref 0.2–1.2)
Total Bilirubin: 0.9 mg/dL (ref 0.2–1.2)
Total Protein: 6.5 g/dL (ref 6.0–8.3)

## 2014-03-13 LAB — PSA, MEDICARE: PSA: 4.54 ng/mL — ABNORMAL HIGH (ref <=4.00)

## 2014-03-15 NOTE — Progress Notes (Signed)
Patient and wife notified and verbalized understanding of the test results. No further questions. I also asked Danae Chen if she could fax labs to [facility] .

## 2014-03-16 ENCOUNTER — Other Ambulatory Visit: Payer: Self-pay | Admitting: *Deleted

## 2014-03-16 ENCOUNTER — Ambulatory Visit (INDEPENDENT_AMBULATORY_CARE_PROVIDER_SITE_OTHER): Payer: 59 | Admitting: Cardiology

## 2014-03-16 ENCOUNTER — Encounter: Payer: Self-pay | Admitting: Cardiology

## 2014-03-16 VITALS — BP 108/62 | HR 68 | Ht 67.0 in | Wt 155.0 lb

## 2014-03-16 DIAGNOSIS — I2510 Atherosclerotic heart disease of native coronary artery without angina pectoris: Secondary | ICD-10-CM | POA: Diagnosis not present

## 2014-03-16 DIAGNOSIS — Z139 Encounter for screening, unspecified: Secondary | ICD-10-CM

## 2014-03-16 DIAGNOSIS — I5022 Chronic systolic (congestive) heart failure: Secondary | ICD-10-CM | POA: Diagnosis not present

## 2014-03-16 DIAGNOSIS — Z01810 Encounter for preprocedural cardiovascular examination: Secondary | ICD-10-CM

## 2014-03-16 DIAGNOSIS — I10 Essential (primary) hypertension: Secondary | ICD-10-CM

## 2014-03-16 LAB — POC HEMOCCULT BLD/STL (HOME/3-CARD/SCREEN)
FECAL OCCULT BLD: NEGATIVE
FECAL OCCULT BLD: NEGATIVE
Fecal Occult Blood, POC: NEGATIVE

## 2014-03-16 MED ORDER — METOPROLOL SUCCINATE ER 25 MG PO TB24: 12.5000 mg | ORAL_TABLET | Freq: Two times a day (BID) | ORAL | Status: DC

## 2014-03-16 NOTE — Patient Instructions (Signed)
Your physician recommends that you schedule a follow-up appointment in: 3-4 weeks     INCREASE Toprol to 12.5 mg twice a day      Thank you for choosing [facility] !

## 2014-03-16 NOTE — Progress Notes (Signed)
Clinical Summary Tommy Turner is a 73 y.o.male seen today for follow up seen today for follow up of the following medical problems.   1. Preoperative cardiac evaluation  - patient with history of AAA followed by vacular, 5.1 x 5.2 cm. Noted rapid growth rate, being considered for EVAR.    2. CAD/ICM  - Reports history of MI 20 years ago at [facility]. Family reports CABG at that time.  - from Dr Earline Mayotte old notes CABG in 2000, op note not in our system..  - echo 07/2009 LVEF 30-35%. Repeat echo 12/29/13 LVEF 25-30%, multiple WMAs, grade I diastolic dysfunction, moderate RV dysfunction.  - since last visit competed Lexiscan MPI, large scar apex and inferolateral wall, no ischemia, LVEF 24%.  - last visit started Toprol XL 12.5mg  daily, tolerating well without significant side effects         Past Medical History  Diagnosis Date  . AAA (abdominal aortic aneurysm)   . Hyperlipidemia     takes Atorvastatin daily  . CAD (coronary artery disease)   . Ulcer     gastric  . Aneurysm   . Anxiety     takes Xanax daily as needed  . Depression     takes Celexa daily  . Hypertension     takes Metoprolol and Benazepril  daily  . Peripheral edema     takes Furosemide daily  . Myocardial infarction     20+yrs ago  . COPD (chronic obstructive pulmonary disease)     Albuterol inhaler and neb daily as needed;takes SPiriva daily  . Shortness of breath     with exertion  . Headache(784.0)     occasionally  . History of shingles      No Known Allergies   Current Outpatient Prescriptions  Medication Sig Dispense Refill  . albuterol (PROVENTIL HFA;VENTOLIN HFA) 108 (90 BASE) MCG/ACT inhaler Inhale 2 puffs into the lungs every 4 (four) hours as needed for wheezing or shortness of breath.    Marland Kitchen albuterol (PROVENTIL) (2.5 MG/3ML) 0.083% nebulizer solution USE 1 VIAL VIA NEBULIZER EVERY 4 HOURS AS NEEDED 150 mL 5  . ALPRAZolam (XANAX) 1 MG tablet Take 0.5-1 mg by mouth 3 (three)  times daily as needed for anxiety (Takes one-half tablet in the morning and afternoon. takes one tablet at bedtime daily).    Marland Kitchen atorvastatin (LIPITOR) 80 MG tablet Take 1 tablet (80 mg total) by mouth daily. 30 tablet 6  . benazepril (LOTENSIN) 40 MG tablet Take 40 mg by mouth daily.    . budesonide-formoterol (SYMBICORT) 160-4.5 MCG/ACT inhaler Inhale 2 puffs into the lungs 2 (two) times daily. 1 Inhaler 0  . citalopram (CELEXA) 20 MG tablet Take 20 mg by mouth at bedtime.    . furosemide (LASIX) 40 MG tablet Take 1 tablet (40 mg total) by mouth 2 (two) times daily. 60 tablet 6  . HYDROcodone-acetaminophen (NORCO) 10-325 MG per tablet Take 1 tablet by mouth 2 (two) times daily as needed (pain). 60 tablet 0  . metoprolol succinate (TOPROL-XL) 25 MG 24 hr tablet Take 0.5 tablets (12.5 mg total) by mouth daily. 45 tablet 3  . nitroGLYCERIN (NITROSTAT) 0.4 MG SL tablet Place 1 tablet (0.4 mg total) under the tongue every 5 (five) minutes as needed for chest pain. 100 tablet 0  . potassium chloride SA (K-DUR,KLOR-CON) 20 MEQ tablet Take 1 tablet (20 mEq total) by mouth daily. 90 tablet 0  . tiotropium (SPIRIVA) 18 MCG inhalation capsule Place  1 capsule (18 mcg total) into inhaler and inhale daily. 90 capsule 3  . UNABLE TO FIND Oxygen 2 liters.     No current facility-administered medications for this visit.     Past Surgical History  Procedure Laterality Date  . Lipoma excision      right lower back  . Partial gastrectomy      approx 1990  . Coronary artery bypass graft  20+yrs ago    x 6  . Hernia repair Left     inguinal  . Tonsillectomy      as a child  . Esophagogastroduodenoscopy       No Known Allergies    Family History  Problem Relation Age of Onset  . Hypertension Brother   . Heart disease Brother     Heart Disease before age 81  . Heart attack Brother   . Cancer Mother   . Diabetes Mother   . Heart disease Mother     Heart Disease before age 81  . Hypertension  Mother   . Cancer Father     prostate  . Hypertension Father   . COPD Sister   . Diabetes Sister   . Heart disease Sister   . Hypertension Sister   . Heart attack Sister   . Colon cancer Neg Hx      Social History Tommy Turner reports that he has been smoking Cigarettes.  He has a 58 pack-year smoking history. He has never used smokeless tobacco. Tommy Turner reports that he does not drink alcohol.   Review of Systems CONSTITUTIONAL: No weight loss, fever, chills, weakness or fatigue.  HEENT: Eyes: No visual loss, blurred vision, double vision or yellow sclerae.No hearing loss, sneezing, congestion, runny nose or sore throat.  SKIN: No rash or itching.  CARDIOVASCULAR: per HPI RESPIRATORY: No shortness of breath, cough or sputum.  GASTROINTESTINAL: No anorexia, nausea, vomiting or diarrhea. No abdominal pain or blood.  GENITOURINARY: No burning on urination, no polyuria NEUROLOGICAL: No headache, dizziness, syncope, paralysis, ataxia, numbness or tingling in the extremities. No change in bowel or bladder control.  MUSCULOSKELETAL: + leg swelling LYMPHATICS: No enlarged nodes. No history of splenectomy.  PSYCHIATRIC: No history of depression or anxiety.  ENDOCRINOLOGIC: No reports of sweating, cold or heat intolerance. No polyuria or polydipsia.  Marland Kitchen   Physical Examination p 68 bp 108/62 Wt 155 lbs BMI 24 Gen: resting comfortably, no acute distress HEENT: no scleral icterus, pupils equal round and reactive, no palptable cervical adenopathy,  CV: RRR, no m/r/g, no JVD Resp: Clear to auscultation bilaterally GI: abdomen is soft, non-tender, non-distended, normal bowel sounds, no hepatosplenomegaly MSK: extremities are warm, 1+ bilateral edema.  Skin: warm, no rash Neuro:  no focal deficits Psych: appropriate affect   Diagnostic Studies Echo 12/2013 Study Conclusions  - Left ventricle: The cavity size was normal. Wall thickness was increased in a pattern of mild LVH.  Systolic function was severely reduced. The estimated ejection fraction was in the range of 25% to 30%. There is akinesis of the inferior, inferoseptal, and apical myocardium. There is akinesis of the midanterolateral myocardium. Doppler parameters are consistent with abnormal left ventricular relaxation (grade 1 diastolic dysfunction). - Aortic valve: Mildly calcified annulus. Trileaflet; moderately calcified leaflets. There was overall mild stenosis. There was trivial regurgitation. Peak gradient (S): 11 mm Hg. Peak velocity ratio of LVOT to aortic valve: 0.43. - Mitral valve: Calcified annulus. Moderately thickened leaflets . There was mild regurgitation. - Left atrium: The atrium was  moderately to severely dilated. - Right ventricle: The cavity size was mildly dilated. Systolic function was moderately reduced. - Right atrium: The atrium was mildly dilated. Central venous pressure (est): 8 mm Hg. - Atrial septum: No defect or patent foramen ovale was identified. - Tricuspid valve: There was trivial regurgitation. - Pulmonary arteries: PA peak pressure: 58 mm Hg (S). - Pericardium, extracardiac: There was no pericardial effusion.  Impressions:  - Mild LVH with LVEF approximately 25-30%, wall motion abnormalities consistent with ischemic cardiomyopathy. Grade 1 diastolic dysfunction. Moderate to severe left atrial enlargement. Heavily calcified mitral annulus with mild mitral regurgitation. Moderately calcified aortic valve with evidence of mild aortic stenosis of relatively low gradient. There is trivial aortic regurgitation. Mild RV dilatation with moderately reduced contraction. Mildly dilated right atrium. PASP calculated at 58 mm mercury.  01/2014 Lexiscan MPI 1. Large area of scar in both the apex and also the inferolateral wall. There is no significant peri-infarct ischemia.  2. Severely reduced left ventricular systolic function. The LV is enlarged  3. Left  ventricular ejection fraction 24%  4. High-risk stress test findings due to severe left ventricular systolic dysfunction and large area of scar. No current myocardium at jeopardy*.   Assessment and Plan  1. CAD/ICM/Chronic systolic HF - from prior notes hx of CABG in 2000 , LVEF 07/2009 at 30-35%  - repeat echo 12/2013, LVEF 25-30%, multiple WMAs. Recent lexiscan shows old scar, no active ischemia - he is NYHA II-III, we are working to optimize his medications. Will increase Toprol XL to 12.5mg  bid, continue benazepril at 40mg  daily.  - once optimized on medical therapy will need repeat LVEF eval, possible ICD  2. Preoperative evaluation  - he is at increased risk for AAA procedure, but not prohibitive. His stress test shows no active ischemia, only scar. We are working to optimize his medical therapy with recent increase in beta blocker today, would not schedule surgery for at least 2 weeks with this recent change, after that recommend to proceed with AAA procedure from cardiac standpoint.     F/u 2-3 weeks for further medication titration      Arnoldo Lenis, M.D.

## 2014-03-17 NOTE — Progress Notes (Signed)
Card sent 

## 2014-04-02 ENCOUNTER — Ambulatory Visit: Payer: 59 | Admitting: Family Medicine

## 2014-04-02 ENCOUNTER — Encounter: Payer: Self-pay | Admitting: Family Medicine

## 2014-04-02 ENCOUNTER — Ambulatory Visit (INDEPENDENT_AMBULATORY_CARE_PROVIDER_SITE_OTHER): Payer: 59 | Admitting: Family Medicine

## 2014-04-02 VITALS — BP 102/60 | Ht 67.0 in | Wt 153.4 lb

## 2014-04-02 DIAGNOSIS — R0600 Dyspnea, unspecified: Secondary | ICD-10-CM

## 2014-04-02 DIAGNOSIS — I2510 Atherosclerotic heart disease of native coronary artery without angina pectoris: Secondary | ICD-10-CM

## 2014-04-02 NOTE — Progress Notes (Signed)
   Subjective:    Patient ID: Tommy Turner, male    DOB: [birth year], 73 y.o.   MRN: [id number]  HPI Patient is here today to see if he still qualifies for oxygen through [employer]. O2 today on room air is 98%.  Patient states that he has no other concerns at this time.  He has a history of COPD gets short of breath when he walks sometimes uses oxygen sometimes doesn't he knows he should use on a regular basis  Review of Systems  Constitutional: Negative for activity change, appetite change and fatigue.  HENT: Negative for congestion.   Respiratory: Positive for shortness of breath. Negative for cough.   Cardiovascular: Negative for chest pain.  Gastrointestinal: Negative for abdominal pain.  Endocrine: Negative for polydipsia and polyphagia.  Neurological: Negative for weakness.  Psychiatric/Behavioral: Negative for confusion.       Objective:   Physical Exam  Constitutional: He appears well-nourished. No distress.  Cardiovascular: Normal rate, regular rhythm and normal heart sounds.   No murmur heard. Pulmonary/Chest: Effort normal and breath sounds normal. No respiratory distress.  Musculoskeletal: He exhibits no edema.  Lymphadenopathy:    He has no cervical adenopathy.  Neurological: He is alert.  Psychiatric: His behavior is normal.  Vitals reviewed.         Assessment & Plan:  98 o2 at rest 84 sat with walking Pt wants lower weight o2 canister  We will send note to his home health care provider in order that this can be covered

## 2014-04-08 ENCOUNTER — Encounter: Payer: 59 | Admitting: Adult Health

## 2014-04-08 NOTE — Progress Notes (Signed)
    Error NO show  

## 2014-04-12 NOTE — Progress Notes (Signed)
HPI: [name] is a 73 year old patient of Dr. Harl Bowie that we follow for ongoing assessment and management of CAD, history of coronary artery bypass grafting in 2000, ischemic cardiomyopathy, with most recent echocardiogram in August of 2015 demonstrating LVEF of 25%-530%, multiple wall motion abnormalities, with grade 1 diastolic dysfunction. The patient had a LexiScan MPI a large scar at the apex and inferior lateral wall without ischemia.  He was last seen by Dr. Harl Bowie on 03/16/2014.at that time, was found to have New [place] Heart Association class II-III, and medications were optimized. His metoprolol was increased to 12.5 mg twice a day, he was continued on benazepril 40 mg daily. He was also evaluated preoperatively for AAA repair. He is advised to wait 2 weeks after beginning higher does of metoprolol before AAA repair. After which, he was recommended to proceed with surgery. He is here for ongoing evaluation.  He comes today stating that his surgery has been post-poned by the insurance company until April of 2016. He is tolerating metoprolol as directed. He is having some positional dizziness, but otherwise he denies any symptoms. He is frustrated that he cannot have surgery until April.   No Known Allergies  Current Outpatient Prescriptions  Medication Sig Dispense Refill  . albuterol (PROVENTIL HFA;VENTOLIN HFA) 108 (90 BASE) MCG/ACT inhaler Inhale 2 puffs into the lungs every 4 (four) hours as needed for wheezing or shortness of breath.    Marland Kitchen albuterol (PROVENTIL) (2.5 MG/3ML) 0.083% nebulizer solution USE 1 VIAL VIA NEBULIZER EVERY 4 HOURS AS NEEDED 150 mL 5  . ALPRAZolam (XANAX) 1 MG tablet Take 0.5-1 mg by mouth 3 (three) times daily as needed for anxiety (Takes one-half tablet in the morning and afternoon. takes one tablet at bedtime daily).    Marland Kitchen aspirin 81 MG tablet Take 81 mg by mouth daily.    Marland Kitchen atorvastatin (LIPITOR) 80 MG tablet Take 1 tablet (80 mg total) by mouth daily. 30  tablet 6  . benazepril (LOTENSIN) 40 MG tablet Take 40 mg by mouth daily.    . budesonide-formoterol (SYMBICORT) 160-4.5 MCG/ACT inhaler Inhale 2 puffs into the lungs 2 (two) times daily. 1 Inhaler 0  . citalopram (CELEXA) 20 MG tablet Take 20 mg by mouth at bedtime.    . furosemide (LASIX) 40 MG tablet Take 1 tablet (40 mg total) by mouth 2 (two) times daily. 60 tablet 6  . HYDROcodone-acetaminophen (NORCO) 10-325 MG per tablet Take 1 tablet by mouth 2 (two) times daily as needed (pain). 60 tablet 0  . metoprolol succinate (TOPROL-XL) 25 MG 24 hr tablet Take 0.5 tablets (12.5 mg total) by mouth 2 (two) times daily. 90 tablet 3  . nitroGLYCERIN (NITROSTAT) 0.4 MG SL tablet Place 1 tablet (0.4 mg total) under the tongue every 5 (five) minutes as needed for chest pain. 100 tablet 0  . potassium chloride SA (K-DUR,KLOR-CON) 20 MEQ tablet Take 1 tablet (20 mEq total) by mouth daily. 90 tablet 0  . tiotropium (SPIRIVA) 18 MCG inhalation capsule Place 1 capsule (18 mcg total) into inhaler and inhale daily. 90 capsule 3  . UNABLE TO FIND Oxygen 2 liters.     No current facility-administered medications for this visit.    Past Medical History  Diagnosis Date  . AAA (abdominal aortic aneurysm)   . Hyperlipidemia     takes Atorvastatin daily  . CAD (coronary artery disease)   . Ulcer     gastric  . Aneurysm   . Anxiety  takes Xanax daily as needed  . Depression     takes Celexa daily  . Hypertension     takes Metoprolol and Benazepril  daily  . Peripheral edema     takes Furosemide daily  . Myocardial infarction     20+yrs ago  . COPD (chronic obstructive pulmonary disease)     Albuterol inhaler and neb daily as needed;takes SPiriva daily  . Shortness of breath     with exertion  . Headache(784.0)     occasionally  . History of shingles     Past Surgical History  Procedure Laterality Date  . Lipoma excision      right lower back  . Partial gastrectomy      approx 1990  .  Coronary artery bypass graft  20+yrs ago    x 6  . Hernia repair Left     inguinal  . Tonsillectomy      as a child  . Esophagogastroduodenoscopy      ROS: Complete review of systems performed and found to be negative unless outlined above  PHYSICAL EXAM BP 118/78 mmHg  Pulse 77  Wt 155 lb (70.308 kg) General: Well developed, well nourished, in no acute distress Head: Eyes PERRLA, No xanthomas.   Normal cephalic and atramatic  Lungs: Some crackles but no wheezes.  Heart: HRRR S1 S2, without MRG.  Pulses are 2+ & equal.            No carotid bruit. No JVD.  No abdominal bruits. No femoral bruits. Abdomen: Bowel sounds are positive, abdomen soft and non-tender without masses or                  Hernia's noted.No bruits. Msk:  Back normal, normal gait. Normal strength and tone for age. Extremities: No clubbing, cyanosis or edema.  DP +1 Neuro: Alert and oriented X 3. Psych:  Good affect, responds appropriately  ASSESSMENT AND PLAN

## 2014-04-13 ENCOUNTER — Encounter: Payer: Self-pay | Admitting: Adult Health

## 2014-04-13 ENCOUNTER — Ambulatory Visit (INDEPENDENT_AMBULATORY_CARE_PROVIDER_SITE_OTHER): Payer: 59 | Admitting: Adult Health

## 2014-04-13 VITALS — BP 118/78 | HR 77 | Wt 155.0 lb

## 2014-04-13 DIAGNOSIS — I10 Essential (primary) hypertension: Secondary | ICD-10-CM

## 2014-04-13 DIAGNOSIS — I2510 Atherosclerotic heart disease of native coronary artery without angina pectoris: Secondary | ICD-10-CM | POA: Diagnosis not present

## 2014-04-13 DIAGNOSIS — I714 Abdominal aortic aneurysm, without rupture, unspecified: Secondary | ICD-10-CM

## 2014-04-13 DIAGNOSIS — F17200 Nicotine dependence, unspecified, uncomplicated: Secondary | ICD-10-CM

## 2014-04-13 DIAGNOSIS — Z720 Tobacco use: Secondary | ICD-10-CM | POA: Diagnosis not present

## 2014-04-13 MED ORDER — POTASSIUM CHLORIDE CRYS ER 20 MEQ PO TBCR: 20.0000 meq | EXTENDED_RELEASE_TABLET | Freq: Every day | ORAL | Status: DC

## 2014-04-13 MED ORDER — BENAZEPRIL HCL 40 MG PO TABS: 40.0000 mg | ORAL_TABLET | Freq: Every day | ORAL | Status: DC

## 2014-04-13 MED ORDER — METOPROLOL SUCCINATE ER 25 MG PO TB24: 12.5000 mg | ORAL_TABLET | Freq: Two times a day (BID) | ORAL | Status: DC

## 2014-04-13 MED ORDER — FUROSEMIDE 40 MG PO TABS: 40.0000 mg | ORAL_TABLET | Freq: Two times a day (BID) | ORAL | Status: DC

## 2014-04-13 MED ORDER — ATORVASTATIN CALCIUM 80 MG PO TABS: 80.0000 mg | ORAL_TABLET | Freq: Every day | ORAL | Status: DC

## 2014-04-13 NOTE — Patient Instructions (Signed)
Your physician wants you to follow-up in: 3 months with Dr.Branch You will receive a reminder letter in the mail two months in advance. If you don't receive a letter, please call our office to schedule the follow-up appointment.     Your physician recommends that you continue on your current medications as directed. Please refer to the Current Medication list given to you today.     Thank you for choosing [facility] !

## 2014-04-13 NOTE — Assessment & Plan Note (Signed)
Advised on cessation.

## 2014-04-13 NOTE — Progress Notes (Deleted)
Name: Tommy Turner    DOB: Aug 31, 1940  Age: 73 y.o.  MR#: [id number]       PCP:  Sallee Lange, MD      Insurance: Payor: [employer] / Plan: [employer] / Product Type: *No Product type* /   CC:    Chief Complaint  Patient presents with  . Coronary Artery Disease  . Cardiomyopathy    Ischemic    VS Filed Vitals:   04/13/14 1519  BP: 118/78  Pulse: 77  Weight: 155 lb (70.308 kg)    Weights Current Weight  04/13/14 155 lb (70.308 kg)  04/02/14 153 lb 6 oz (69.57 kg)  03/16/14 155 lb (70.308 kg)    Blood Pressure  BP Readings from Last 3 Encounters:  04/13/14 118/78  04/02/14 102/60  03/16/14 108/62     Admit date:  (Not on file) Last encounter with RMR:  01/20/2014   Allergy Review of patient's allergies indicates no known allergies.  Current Outpatient Prescriptions  Medication Sig Dispense Refill  . albuterol (PROVENTIL HFA;VENTOLIN HFA) 108 (90 BASE) MCG/ACT inhaler Inhale 2 puffs into the lungs every 4 (four) hours as needed for wheezing or shortness of breath.    Marland Kitchen albuterol (PROVENTIL) (2.5 MG/3ML) 0.083% nebulizer solution USE 1 VIAL VIA NEBULIZER EVERY 4 HOURS AS NEEDED 150 mL 5  . ALPRAZolam (XANAX) 1 MG tablet Take 0.5-1 mg by mouth 3 (three) times daily as needed for anxiety (Takes one-half tablet in the morning and afternoon. takes one tablet at bedtime daily).    Marland Kitchen aspirin 81 MG tablet Take 81 mg by mouth daily.    Marland Kitchen atorvastatin (LIPITOR) 80 MG tablet Take 1 tablet (80 mg total) by mouth daily. 30 tablet 6  . benazepril (LOTENSIN) 40 MG tablet Take 40 mg by mouth daily.    . budesonide-formoterol (SYMBICORT) 160-4.5 MCG/ACT inhaler Inhale 2 puffs into the lungs 2 (two) times daily. 1 Inhaler 0  . citalopram (CELEXA) 20 MG tablet Take 20 mg by mouth at bedtime.    . furosemide (LASIX) 40 MG tablet Take 1 tablet (40 mg total) by mouth 2 (two) times daily. 60 tablet 6  . HYDROcodone-acetaminophen (NORCO) 10-325 MG per tablet Take 1 tablet by mouth 2  (two) times daily as needed (pain). 60 tablet 0  . metoprolol succinate (TOPROL-XL) 25 MG 24 hr tablet Take 0.5 tablets (12.5 mg total) by mouth 2 (two) times daily. 90 tablet 3  . nitroGLYCERIN (NITROSTAT) 0.4 MG SL tablet Place 1 tablet (0.4 mg total) under the tongue every 5 (five) minutes as needed for chest pain. 100 tablet 0  . potassium chloride SA (K-DUR,KLOR-CON) 20 MEQ tablet Take 1 tablet (20 mEq total) by mouth daily. 90 tablet 0  . tiotropium (SPIRIVA) 18 MCG inhalation capsule Place 1 capsule (18 mcg total) into inhaler and inhale daily. 90 capsule 3  . UNABLE TO FIND Oxygen 2 liters.     No current facility-administered medications for this visit.    Discontinued Meds:   There are no discontinued medications.  Patient Active Problem List   Diagnosis Date Noted  . Elevated PSA 03/09/2014  . Peripheral vascular disease, unspecified 01/22/2014  . Popliteal artery aneurysm 12/07/2013  . Chronic pain syndrome 10/27/2013  . Hyperglycemia 04/09/2013  . Acute respiratory failure with hypoxia 04/09/2013  . COPD with acute exacerbation 04/05/2013  . COPD exacerbation 04/05/2013  . Loss of weight 03/18/2013  . Early satiety 03/18/2013  . Dysphagia, unspecified(787.20) 03/18/2013  .  COPD (chronic obstructive pulmonary disease) 10/31/2012  . Hyperlipemia 10/31/2012  . CAD (coronary artery disease) 10/31/2012  . Anxiety 10/31/2012  . Abdominal aortic aneurysm 02/02/2011  . TOBACCO ABUSE 09/20/2009  . PEPTIC ULCER DISEASE 09/20/2009  . Essential hypertension 09/19/2009  . BRONCHITIS, CHRONIC 09/19/2009    LABS    Component Value Date/Time   NA 133* 03/12/2014 0852   NA 139 01/19/2014 1042   NA 139 11/12/2013 0855   K 4.3 03/12/2014 0852   K 4.4 01/19/2014 1042   K 4.7 11/12/2013 0855   CL 93* 03/12/2014 0852   CL 98 01/19/2014 1042   CL 99 11/12/2013 0855   CO2 30 03/12/2014 0852   CO2 31 01/19/2014 1042   CO2 32 11/12/2013 0855   GLUCOSE 101* 03/12/2014 0852    GLUCOSE 127* 01/19/2014 1042   GLUCOSE 106* 11/12/2013 0855   BUN 11 03/12/2014 0852   BUN 10 01/19/2014 1042   BUN 11 11/12/2013 0855   CREATININE 0.52 03/12/2014 0852   CREATININE 0.61 01/19/2014 1042   CREATININE 0.56 11/12/2013 0855   CREATININE 0.58 04/11/2013 0653   CREATININE 0.57 04/10/2013 0625   CREATININE 0.69 02/24/2013 0753   CALCIUM 9.1 03/12/2014 0852   CALCIUM 9.3 01/19/2014 1042   CALCIUM 9.5 11/12/2013 0855   GFRNONAA >90 01/19/2014 1042   GFRNONAA >90 04/11/2013 0653   GFRNONAA >90 04/10/2013 0625   GFRAA >90 01/19/2014 1042   GFRAA >90 04/11/2013 0653   GFRAA >90 04/10/2013 0625   CMP     Component Value Date/Time   NA 133* 03/12/2014 0852   K 4.3 03/12/2014 0852   CL 93* 03/12/2014 0852   CO2 30 03/12/2014 0852   GLUCOSE 101* 03/12/2014 0852   BUN 11 03/12/2014 0852   CREATININE 0.52 03/12/2014 0852   CREATININE 0.61 01/19/2014 1042   CALCIUM 9.1 03/12/2014 0852   PROT 6.5 03/12/2014 0852   ALBUMIN 3.9 03/12/2014 0852   AST 19 03/12/2014 0852   ALT 12 03/12/2014 0852   ALKPHOS 87 03/12/2014 0852   BILITOT 0.9 03/12/2014 0852   GFRNONAA >90 01/19/2014 1042   GFRAA >90 01/19/2014 1042       Component Value Date/Time   WBC 7.8 01/19/2014 1042   WBC 7.2 11/12/2013 0855   WBC 8.5 04/09/2013 0524   HGB 15.7 01/19/2014 1042   HGB 15.9 11/12/2013 0855   HGB 15.1 04/09/2013 0524   HGB 16.7 03/12/2013 0946   HCT 48.8 01/19/2014 1042   HCT 47.6 11/12/2013 0855   HCT 48.5 04/09/2013 0524   MCV 90.5 01/19/2014 1042   MCV 87.8 11/12/2013 0855   MCV 96.6 04/09/2013 0524    Lipid Panel     Component Value Date/Time   CHOL 119 03/12/2014 0852   TRIG 52 03/12/2014 0852   HDL 55 03/12/2014 0852   CHOLHDL 2.2 03/12/2014 0852   VLDL 10 03/12/2014 0852   LDLCALC 54 03/12/2014 0852    ABG    Component Value Date/Time   PHART 7.434 01/19/2014 1042   PCO2ART 49.6* 01/19/2014 1042   PO2ART 59.7* 01/19/2014 1042   HCO3 32.7* 01/19/2014 1042    TCO2 34.2 01/19/2014 1042   O2SAT 90.5 01/19/2014 1042     No results found for: TSH BNP (last 3 results) No results for input(s): PROBNP in the last 8760 hours. Cardiac Panel (last 3 results) No results for input(s): CKTOTAL, CKMB, TROPONINI, RELINDX in the last 72 hours.  Iron/TIBC/Ferritin/ %Sat No results found for: IRON,  TIBC, FERRITIN, IRONPCTSAT   EKG Orders placed or performed in visit on 03/16/14  . EKG 12-Lead     Prior Assessment and Plan Problem List as of 04/13/2014      Cardiovascular and Mediastinum   Essential hypertension   CAD (coronary artery disease)   Last Assessment & Plan   01/20/2014 Office Visit Written 01/20/2014  1:58 PM by Lendon Colonel, NP    Stress test completed as discussed above. No new areas of ischemia. There no current myocardium at jeopardy. Continue current medication regimen, and risk management. He is completely asymptomatic.    Abdominal aortic aneurysm   Last Assessment & Plan   01/20/2014 Office Visit Written 01/20/2014  1:57 PM by Lendon Colonel, NP    Stress Myoview, which was completed on 01/08/2014, as discussed above. He continues to have severe global hypokinesis with an LVEF of 24% per nuclear medicine gating. The patient is an acceptable cardiac risk to proceed with AAA repair. It is recommended that he continue the Toprol call 12.5  mg twice a day perioperatively, along with ACE inhibitor and diuretic. Watching closely for fluid overload in the setting of significant systolic dysfunction.  We are available for consultation perioperatively to assist with care for ongoing cardiac management. A letter has been sent as well from our office to Dr. Bridgett Larsson. The patient has an appointment with him in 2 days or discussion of surgery. Please feel free to call our office for any questions, as stated, we are available at [facility] for any cardiac issues. This has been explained to the patient verbalizes understanding. We will see him  postoperatively in our office on followup     Popliteal artery aneurysm   Peripheral vascular disease, unspecified     Respiratory   COPD (chronic obstructive pulmonary disease)   Last Assessment & Plan   10/30/2012 Office Visit Written 10/31/2012 12:41 PM by Nilda Simmer, NP    Stable.  Continue current regimen. Patient does not feel he can stop smoking at this point due to his level of stress.    BRONCHITIS, CHRONIC   COPD with acute exacerbation   COPD exacerbation   Acute respiratory failure with hypoxia     Digestive   PEPTIC ULCER DISEASE   Dysphagia, unspecified(787.20)   Last Assessment & Plan   03/18/2013 Office Visit Written 03/18/2013 11:48 AM by Orvil Feil, NP    EGD/ED as planned.       Other   Hyperlipemia   Anxiety   Last Assessment & Plan   10/30/2012 Office Visit Written 10/31/2012 12:40 PM by Nilda Simmer, NP    Plan: Stop Wellbutrin. Switched to Celexa 20 mg a half tab by mouth daily x6 days then 1 by mouth daily. DC med and call if any adverse effects. Change Xanax to 1 mg half in the morning, half in the afternoon and 1 at bedtime as needed. Patient has several family members who has addiction problems. Reminded patient to keep his medications hidden. Recheck here in one month, call back sooner if any problems.    TOBACCO ABUSE   Last Assessment & Plan   01/20/2014 Office Visit Written 01/20/2014  1:59 PM by Lendon Colonel, NP    I have spoken with the patient concerning tobacco cessation. I have explained to him. This will affect his perioperative recovery, and overall maintenance of health if he continues to smoke. I have advised him that while he is in the hospital. He  will not be allowed to smoke, and would recommend that he do not restart on discharge. He verbalizes understanding.    Loss of weight   Last Assessment & Plan   03/18/2013 Office Visit Written 03/18/2013 11:48 AM by Orvil Feil, NP    73 year old male presents today with steady  weight loss over the past 2 years of close to 20 lbs, 10 of those in the last few months. Associated symptoms include early satiety and vague esophageal dysphagia. Denies any GERD symptoms, rectal bleeding, melena, abdominal pain, or change in bowel habits. CT chest negative, Hgb normal through PCP office.   As he has never had a colonoscopy, needs screening colonoscopy now and upper EGD as well to assess for occult malignancy. Notably, he does have a history of PUD, requiring gastrectomy in the 1990s. Op notes requested from [facility]. Dysphagia may be secondary to web, ring, or stricture. Unable to exclude malignancy. If EGD/TCS negative, proceed with CT of abdomen.   Proceed with TCS/EGD/ED with Dr. Gala Romney in near future: the risks, benefits, and alternatives have been discussed with the patient in detail. The patient states understanding and desires to proceed.     Early satiety   Last Assessment & Plan   03/18/2013 Office Visit Written 03/18/2013 11:48 AM by Orvil Feil, NP    Unclear etiology. EGD/ED as planned.     Hyperglycemia   Chronic pain syndrome   Elevated PSA       Imaging: No results found.

## 2014-04-13 NOTE — Assessment & Plan Note (Signed)
Low normal. No changes at this time in medication regimen. See again in 3 months.

## 2014-04-13 NOTE — Assessment & Plan Note (Signed)
Surgery has been postponed until April of 2016 per insurance restrictions. He is tolerating the metoprolol 12.5 mg BID, but is having some positional dizziness. BP is low normal for systolic dysfunction. I am reluctant to increase the dose of BB due to this.   He will see Dr.Branch in 3 months before going to surgery if scheduled in April. He may decide to increase the dose to 25 mg BID at that time. Letter to release for surgery has already been written and sent to [facility], Dr. Bridgett Larsson,.

## 2014-04-13 NOTE — Assessment & Plan Note (Signed)
Continue risk management. Refills are provided for statin, ACE and and metoprolol. See him again in 3 months.

## 2014-04-16 ENCOUNTER — Ambulatory Visit (INDEPENDENT_AMBULATORY_CARE_PROVIDER_SITE_OTHER): Payer: 59 | Admitting: Urology

## 2014-04-16 DIAGNOSIS — R972 Elevated prostate specific antigen [PSA]: Secondary | ICD-10-CM

## 2014-04-16 DIAGNOSIS — R351 Nocturia: Secondary | ICD-10-CM

## 2014-04-18 ENCOUNTER — Telehealth: Payer: Self-pay | Admitting: Family Medicine

## 2014-04-18 DIAGNOSIS — R0600 Dyspnea, unspecified: Secondary | ICD-10-CM

## 2014-04-18 NOTE — Telephone Encounter (Signed)
Nurses according to home health this patient needs overnight O2 sat study in order to qualify for oxygen reason for the study would be dyspnea/hypoxia/COPD #1 please set up test #2 inform patient #3 please keep the papers that I forwarded to you handy to follow-up on this in 1-2 weeks when the results are back so that everything may be completed and forwarded to home health thank you for taking ownership

## 2014-04-19 NOTE — Addendum Note (Signed)
Addended by: Dairl Ponder on: 04/19/2014 09:53 AM   Modules accepted: Orders

## 2014-04-20 NOTE — Telephone Encounter (Signed)
Referral for home health for overnight O2 ordered in Epic

## 2014-04-20 NOTE — Telephone Encounter (Addendum)
Left message to return call to inform patient

## 2014-04-21 NOTE — Telephone Encounter (Signed)
Discussed with patient. Patient states that he has spoken with the home health agency [facility] and they are in the process of setting up the overnight O2 study and he will let us know when he has it done and schedule a follow up office visit.

## 2014-05-21 ENCOUNTER — Ambulatory Visit: Payer: 59 | Admitting: Family Medicine

## 2014-05-25 ENCOUNTER — Other Ambulatory Visit: Payer: Self-pay

## 2014-05-25 DIAGNOSIS — Z01818 Encounter for other preprocedural examination: Secondary | ICD-10-CM

## 2014-05-25 DIAGNOSIS — I714 Abdominal aortic aneurysm, without rupture, unspecified: Secondary | ICD-10-CM

## 2014-05-25 NOTE — Progress Notes (Signed)
Discussed with Dr. Bridgett Larsson the next step in evaluation of pt's AAA.  Stated the pt. will need a "CTA Abd and Pelvis with 3D Recon" at his 6 month appt. (08/13/14)  Order placed as instructed.

## 2014-05-31 ENCOUNTER — Other Ambulatory Visit: Payer: Self-pay | Admitting: Family Medicine

## 2014-06-03 ENCOUNTER — Ambulatory Visit (INDEPENDENT_AMBULATORY_CARE_PROVIDER_SITE_OTHER): Payer: 59 | Admitting: Family Medicine

## 2014-06-03 ENCOUNTER — Encounter: Payer: Self-pay | Admitting: Family Medicine

## 2014-06-03 ENCOUNTER — Ambulatory Visit (HOSPITAL_COMMUNITY)
Admission: RE | Admit: 2014-06-03 | Discharge: 2014-06-03 | Disposition: A | Discharge status: Home / Self Care | Payer: 59 | Source: Ambulatory Visit | Attending: Family Medicine | Admitting: Family Medicine

## 2014-06-03 VITALS — BP 112/64 | Ht 67.0 in | Wt 156.0 lb

## 2014-06-03 DIAGNOSIS — R0902 Hypoxemia: Secondary | ICD-10-CM

## 2014-06-03 DIAGNOSIS — R0600 Dyspnea, unspecified: Secondary | ICD-10-CM

## 2014-06-03 DIAGNOSIS — J432 Centrilobular emphysema: Secondary | ICD-10-CM

## 2014-06-03 LAB — BLOOD GAS, ARTERIAL
Acid-Base Excess: 9.3 mmol/L — ABNORMAL HIGH (ref 0.0–2.0)
BICARBONATE: 33.8 meq/L — AB (ref 20.0–24.0)
DRAWN BY: 23534
FIO2: 0.21 %
O2 Content: 21 L/min
O2 SAT: 87.4 %
PCO2 ART: 51.5 mmHg — AB (ref 35.0–45.0)
Patient temperature: 37
TCO2: 29.1 mmol/L (ref 0–100)
pH, Arterial: 7.433 (ref 7.350–7.450)
pO2, Arterial: 53.7 mmHg — ABNORMAL LOW (ref 80.0–100.0)

## 2014-06-03 MED ORDER — HYDROCODONE-ACETAMINOPHEN 10-325 MG PO TABS: 1.0000 | ORAL_TABLET | Freq: Two times a day (BID) | ORAL | Status: DC | PRN

## 2014-06-03 NOTE — Progress Notes (Signed)
Patient's wife notified and verbalized understanding of test results. No further questions. Routed to [place] to have results faxed to [facility]

## 2014-06-03 NOTE — Progress Notes (Signed)
   Subjective:    Patient ID: Tommy Turner, male    DOB: 1940-05-20, 74 y.o.   MRN: [id number]  HPINeeds form filled out for [facility] to get oxygen. Pt is on 2 liters O2. Pt needed overnight 02 sat. Pt states home health came out twice to do test and was unable to do test.   Pt also needs refill on hydrocodone.    Review of Systems Patient relates dyspnea with activity also relates some intermittent cough.    Objective:   Physical Exam  On physical exam lungs clear heart regular fingers are cool to the touch color slightly dusky abdomen soft  Numerous attempts were made to check O2 sat on room air. All of these failed. O2 sat meter would not register on his hand. We warmed up his hand for 5 minutes and the hot water it still did not register.    Assessment & Plan:  COPD continue using inhalers as directed Hypoxia-ABG shows PO2 53.7 this patient is medically indicated for oxygen 2 L per nasal cannula continuously  Patient responsibly uses pain medication he does not use it often a refill was given to him

## 2014-07-16 ENCOUNTER — Encounter: Payer: Self-pay | Admitting: Family Medicine

## 2014-07-16 ENCOUNTER — Ambulatory Visit: Payer: 59 | Admitting: Urology

## 2014-07-16 ENCOUNTER — Ambulatory Visit (INDEPENDENT_AMBULATORY_CARE_PROVIDER_SITE_OTHER): Payer: 59 | Admitting: Family Medicine

## 2014-07-16 VITALS — BP 138/88 | Temp 97.9°F | Ht 67.0 in | Wt 156.0 lb

## 2014-07-16 DIAGNOSIS — L03115 Cellulitis of right lower limb: Secondary | ICD-10-CM

## 2014-07-16 MED ORDER — DOXYCYCLINE HYCLATE 100 MG PO CAPS: 100.0000 mg | ORAL_CAPSULE | Freq: Two times a day (BID) | ORAL | Status: DC

## 2014-07-16 NOTE — Progress Notes (Signed)
   Subjective:    Patient ID: Tommy Turner, male    DOB: Jan 29, 1941, 74 y.o.   MRN: [id number]  HPI Pt hit his right lower, leg on a ladder last week.  It is red and swollen.  Painful to the touch. No high fever chills or sweats relates pain discomfort   Review of Systems     Objective:   Physical Exam lungs clear heart regular right leg  Mild cellulitis with abrasion   I don't feel the patient has a broken bone Assessment & Plan:  Cellulitis antibiotics prescribed warning signs discussed follow-up if problems I did not give the patient a tetanus shot when he was here which I should've done we will call the patient back to give a tetanus shot  Warning signs discuss

## 2014-07-29 ENCOUNTER — Telehealth: Payer: Self-pay | Admitting: Family Medicine

## 2014-07-29 MED ORDER — HYDROCODONE-ACETAMINOPHEN 10-325 MG PO TABS: 1.0000 | ORAL_TABLET | Freq: Two times a day (BID) | ORAL | Status: DC | PRN

## 2014-07-29 NOTE — Telephone Encounter (Signed)
Left message on voicemail notifying patient that script is ready for pickup.  

## 2014-07-29 NOTE — Telephone Encounter (Signed)
HYDROcodone-acetaminophen (NORCO) 10-325 MG per tablet  Last filled here 06/03/14  Last seen 06/03/14

## 2014-07-29 NOTE — Telephone Encounter (Signed)
May have refill, bcz controlled med he will need follow up ov before additional refills

## 2014-07-30 ENCOUNTER — Ambulatory Visit: Payer: 59 | Admitting: Vascular Surgery

## 2014-07-30 ENCOUNTER — Other Ambulatory Visit: Payer: 59

## 2014-08-12 ENCOUNTER — Other Ambulatory Visit: Payer: Self-pay | Admitting: Vascular Surgery

## 2014-08-13 ENCOUNTER — Ambulatory Visit
Admission: RE | Admit: 2014-08-13 | Discharge: 2014-08-13 | Disposition: A | Discharge status: Home / Self Care | Payer: 59 | Source: Ambulatory Visit | Attending: Vascular Surgery | Admitting: Vascular Surgery

## 2014-08-13 ENCOUNTER — Ambulatory Visit: Payer: 59 | Admitting: Vascular Surgery

## 2014-08-13 DIAGNOSIS — N289 Disorder of kidney and ureter, unspecified: Secondary | ICD-10-CM | POA: Diagnosis not present

## 2014-08-13 DIAGNOSIS — I714 Abdominal aortic aneurysm, without rupture, unspecified: Secondary | ICD-10-CM

## 2014-08-13 DIAGNOSIS — I739 Peripheral vascular disease, unspecified: Secondary | ICD-10-CM

## 2014-08-13 DIAGNOSIS — Z01818 Encounter for other preprocedural examination: Secondary | ICD-10-CM

## 2014-08-13 DIAGNOSIS — I724 Aneurysm of artery of lower extremity: Secondary | ICD-10-CM

## 2014-08-13 LAB — CREATININE, SERUM: CREATININE: 0.51 mg/dL (ref 0.50–1.35)

## 2014-08-13 LAB — BUN: BUN: 12 mg/dL (ref 6–23)

## 2014-08-13 MED ORDER — IOPAMIDOL (ISOVUE-370) INJECTION 76%
80.0000 mL | Freq: Once | INTRAVENOUS | Status: AC | PRN
  Administered 2014-08-13: 80 mL via INTRAVENOUS

## 2014-08-16 ENCOUNTER — Other Ambulatory Visit: Payer: Self-pay | Admitting: Family Medicine

## 2014-08-24 ENCOUNTER — Other Ambulatory Visit: Payer: Self-pay | Admitting: Family Medicine

## 2014-08-26 ENCOUNTER — Encounter: Payer: Self-pay | Admitting: Vascular Surgery

## 2014-08-27 ENCOUNTER — Ambulatory Visit (INDEPENDENT_AMBULATORY_CARE_PROVIDER_SITE_OTHER): Payer: 59 | Admitting: Vascular Surgery

## 2014-08-27 ENCOUNTER — Encounter: Payer: Self-pay | Admitting: Vascular Surgery

## 2014-08-27 ENCOUNTER — Other Ambulatory Visit: Payer: Self-pay

## 2014-08-27 VITALS — BP 128/67 | HR 64 | Ht 67.0 in | Wt 151.5 lb

## 2014-08-27 DIAGNOSIS — I714 Abdominal aortic aneurysm, without rupture, unspecified: Secondary | ICD-10-CM

## 2014-08-27 DIAGNOSIS — F419 Anxiety disorder, unspecified: Secondary | ICD-10-CM | POA: Diagnosis not present

## 2014-08-27 NOTE — Progress Notes (Signed)
Established Abdominal Aortic Aneurysm  History of Present Illness  The patient is a 74 y.o. (1940/11/24) male who presents with chief complaint: follow up for AAA.  Prior request for EVAR was blocked by [facility] as reported the AAA was only 4.5 cm.  I did not agree with the read, unfortunately the raw data from the CT had been dumped so 3D reconstruction could not be done with centerline measurements.  The patient returns 6 month later with repeat CTA abd/pelvis with 3D reconstruction.  The patient has chronic back pain.  The patient is a smoker.  Pt continues to have anxiety with risk of rupture of his AAA which causes him to lose sleep.  Past Medical History  Diagnosis Date  . AAA (abdominal aortic aneurysm)   . Hyperlipidemia     takes Atorvastatin daily  . CAD (coronary artery disease)   . Ulcer     gastric  . Aneurysm   . Anxiety     takes Xanax daily as needed  . Depression     takes Celexa daily  . Hypertension     takes Metoprolol and Benazepril  daily  . Peripheral edema     takes Furosemide daily  . Myocardial infarction     20+yrs ago  . COPD (chronic obstructive pulmonary disease)     Albuterol inhaler and neb daily as needed;takes SPiriva daily  . Shortness of breath     with exertion  . Headache(784.0)     occasionally  . History of shingles   . CHF (congestive heart failure)   . Cancer     Past Surgical History  Procedure Laterality Date  . Lipoma excision      right lower back  . Partial gastrectomy      approx 1990  . Coronary artery bypass graft  20+yrs ago    x 6  . Hernia repair Left     inguinal  . Tonsillectomy      as a child  . Esophagogastroduodenoscopy      History   Social History  . Marital Status: Married    Spouse Name: N/A  . Number of Children: N/A  . Years of Education: N/A   Occupational History  . Not on file.   Social History Main Topics  . Smoking status: Current Every Day Smoker -- 0.50 packs/day for 58 years   Types: Cigarettes  . Smokeless tobacco: Current User     Comment: vapor ciggs  . Alcohol Use: No  . Drug Use: No  . Sexual Activity: Not Currently   Other Topics Concern  . Not on file   Social History Narrative    Family History  Problem Relation Age of Onset  . Hypertension Brother   . Heart disease Brother     Heart Disease before age 44  . Heart attack Brother   . Cancer Brother   . Cancer Mother   . Diabetes Mother   . Heart disease Mother     Heart Disease before age 34  . Hypertension Mother   . Heart attack Mother   . Cancer Father     prostate  . Hypertension Father   . COPD Sister   . Diabetes Sister   . Heart disease Sister   . Hypertension Sister   . Heart attack Sister   . Colon cancer Neg Hx     Current Outpatient Prescriptions on File Prior to Visit  Medication Sig Dispense Refill  . albuterol (PROVENTIL) (  2.5 MG/3ML) 0.083% nebulizer solution USE 1 VIAL VIA NEBULIZER EVERY 4 HOURS AS NEEDED 150 mL 5  . ALPRAZolam (XANAX) 1 MG tablet Take 0.5-1 mg by mouth 3 (three) times daily as needed for anxiety (Takes one-half tablet in the morning and afternoon. takes one tablet at bedtime daily).    Marland Kitchen aspirin 81 MG tablet Take 81 mg by mouth daily.    Marland Kitchen atorvastatin (LIPITOR) 80 MG tablet Take 1 tablet (80 mg total) by mouth daily. 30 tablet 6  . benazepril (LOTENSIN) 40 MG tablet Take 1 tablet (40 mg total) by mouth daily. 90 tablet 3  . budesonide-formoterol (SYMBICORT) 160-4.5 MCG/ACT inhaler Inhale 2 puffs into the lungs 2 (two) times daily. 1 Inhaler 0  . citalopram (CELEXA) 20 MG tablet Take 20 mg by mouth at bedtime.    . citalopram (CELEXA) 20 MG tablet TAKE 1/2 TABLET BY MOUTH AT BEDTIME FOR 6 DAYS, THEN 1 TABLET AT BEDTIME 30 tablet 0  . furosemide (LASIX) 40 MG tablet Take 1 tablet (40 mg total) by mouth 2 (two) times daily. 60 tablet 6  . HYDROcodone-acetaminophen (NORCO) 10-325 MG per tablet Take 1 tablet by mouth 2 (two) times daily as needed (pain).  60 tablet 0  . metoprolol succinate (TOPROL-XL) 25 MG 24 hr tablet Take 0.5 tablets (12.5 mg total) by mouth 2 (two) times daily. 90 tablet 3  . nitroGLYCERIN (NITROSTAT) 0.4 MG SL tablet Place 1 tablet (0.4 mg total) under the tongue every 5 (five) minutes as needed for chest pain. 100 tablet 0  . potassium chloride SA (K-DUR,KLOR-CON) 20 MEQ tablet Take 1 tablet (20 mEq total) by mouth daily. 90 tablet 3  . tiotropium (SPIRIVA) 18 MCG inhalation capsule Place 1 capsule (18 mcg total) into inhaler and inhale daily. 90 capsule 3  . UNABLE TO FIND Oxygen 2 liters.    . VENTOLIN HFA 108 (90 BASE) MCG/ACT inhaler INHALE 2 PUFFS BY MOUTH EVERY 4-6 HOURS AS NEEDED 18 g 5  . doxycycline (VIBRAMYCIN) 100 MG capsule Take 1 capsule (100 mg total) by mouth 2 (two) times daily. (Patient not taking: Reported on 08/27/2014) 20 capsule 0   No current facility-administered medications on file prior to visit.    No Known Allergies  REVIEW OF SYSTEMS:  (Positives checked otherwise negative)  CARDIOVASCULAR:  []  chest pain, []  chest pressure, []  palpitations, []  shortness of breath when laying flat, []  shortness of breath with exertion,  []  pain in feet when walking, []  pain in feet when laying flat, []  history of blood clot in veins (DVT), []  history of phlebitis, []  swelling in legs, []  varicose veins  PULMONARY:  []  productive cough, []  asthma, []  wheezing  NEUROLOGIC:  []  weakness in arms or legs, []  numbness in arms or legs, []  difficulty speaking or slurred speech, []  temporary loss of vision in one eye, []  dizziness  HEMATOLOGIC:  []  bleeding problems, []  problems with blood clotting too easily  MUSCULOSKEL:  []  joint pain, []  joint swelling  GASTROINTEST:  []  vomiting blood, []  blood in stool     GENITOURINARY:  []  burning with urination, []  blood in urine  PSYCHIATRIC:  []  history of major depression, [x]  anxiety over aneurysm  INTEGUMENTARY:  []  rashes, []  ulcers   Physical  Examination  Filed Vitals:   08/27/14 1202  BP: 128/67  Pulse: 64  Height: 5\' 7"  (1.702 m)  Weight: 151 lb 8 oz (68.72 kg)  SpO2: 92%   Body mass index is  23.72 kg/(m^2).  General: A&O x 3, WD, thin  Pulmonary: Sym exp, good air movt, CTAB, no rales, rhonchi, & wheezing  Cardiac: RRR, Nl S1, S2, no Murmurs, rubs or gallops  Vascular: Vessel Right Left  Radial Palpable Palpable  Brachial Palpable Palpable  Carotid Palpable, without bruit Palpable, without bruit  Aorta AAA palpable N/A  Femoral Palpable Palpable  Popliteal Not palpable Not palpable  PT Palpable Palpable  DP Faintly Palpable Faintly Palpable   Gastrointestinal: soft, NTND, -G/R, - HSM, - masses, - CVAT B  Musculoskeletal: M/S 5/5 throughout , Extremities without ischemic changes   Neurologic: CN 2-12 intact , Pain and light touch intact in extremities , Motor exam as listed above  Radiology: CTA Abd/Pelvis (08/13/14) Infrarenal abdominal aortic aneurysm with a maximal transverse diameter of 5.0 cm as described above.  Medical Decision Making  The patient is a 74 y.o. male who presents with: asymptomatic large AAA with increasing size, anxiety due AAA, R kidney lesion   Aneurysm is now 5.0 cm, which represents 0.5 cm increase over the reportedly 4.5 cm cited by [facility].  This meets the growth criteria for AAA repair.  Based on this patient's exam and diagnostic studies, he needs EVAR.  Tentatively, this patient is scheduled for EVAR on 10 MAY 16. The patient is aware the risks of endovascular aortic surgery include but are not limited to: bleeding, need for transfusion, infection, death, stroke, paralysis, wound complications, bowel ischemia, extended ventilation, anaphylactic reaction to contrast, contrast induced nephropathy, embolism, and need for additional procedure to address endoleaks.   Overall, I cited a mortality rate of 1-2% and morbidity rate of 15%.  I emphasized the importance of maximal medical  management including strict control of blood pressure, blood glucose, and lipid levels, antiplatelet agents, obtaining regular exercise, and cessation of smoking.    Thank you for allowing [place] to participate in this patient's care.  Adele Barthel, MD [facility] Office: [phone] Pager: [phone]  08/27/2014, 12:32 PM

## 2014-09-06 ENCOUNTER — Telehealth: Payer: Self-pay | Admitting: Family Medicine

## 2014-09-06 MED ORDER — HYDROCODONE-ACETAMINOPHEN 10-325 MG PO TABS: 1.0000 | ORAL_TABLET | Freq: Two times a day (BID) | ORAL | Status: DC | PRN

## 2014-09-06 NOTE — Telephone Encounter (Signed)
Left message on voicemail notifying patient that script will be ready for pickup in the am and needs office visit for further refills.

## 2014-09-06 NOTE — Telephone Encounter (Signed)
May have one refill but needs office visit before further refills

## 2014-09-06 NOTE — Telephone Encounter (Signed)
Pt is needing a refill on her pain meds.  

## 2014-09-06 NOTE — Telephone Encounter (Signed)
Last seen 07/16/14 (sick)

## 2014-09-07 ENCOUNTER — Other Ambulatory Visit: Payer: Self-pay

## 2014-09-08 ENCOUNTER — Other Ambulatory Visit (HOSPITAL_COMMUNITY): Payer: 59

## 2014-09-09 NOTE — Pre-Procedure Instructions (Signed)
Tommy Turner  09/09/2014   Your procedure is scheduled on:  Tuesday  09/14/14   Report to [facility] Admitting at 730 AM.   Call this number if you have problems the morning of surgery: [phone]   Remember:   Do not eat food or drink liquids after midnight.    Take these medicines the morning of surgery with A SIP OF WATER: Inhalers and nebulizer as ordered by MD,xanax  If needed,doxycycline,pain medication if needed. Metoprolol(Toprol XL),potassium chloride   Do not wear jewelry,   Do not wear lotions, powders, or perfumes.   Do not shave 48 hours prior to surgery. Men may shave face and neck.   Do not bring valuables to the hospital.  [facility] is not responsible  for any belongings or valuables.               Contacts, dentures or bridgework may not be worn into surgery.   Leave suitcase in the car. After surgery it may be brought to your room.   For patients admitted to the hospital, discharge time is determined by your treatment team.                   Special Instructions: [place] - Preparing for Surgery  Before surgery, you can play an important role.  Because skin is not sterile, your skin needs to be as free of germs as possible.  You can reduce the number of germs on you skin by washing with CHG (chlorahexidine gluconate) soap before surgery.  CHG is an antiseptic cleaner which kills germs and bonds with the skin to continue killing germs even after washing.  Please DO NOT use if you have an allergy to CHG or antibacterial soaps.  If your skin becomes reddened/irritated stop using the CHG and inform your nurse when you arrive at Short Stay.  Do not shave (including legs and underarms) for at least 48 hours prior to the first CHG shower.  You may shave your face.  Please follow these instructions carefully:   1.  Shower with CHG Soap the night before surgery and the   morning of Surgery.  2.  If you choose to wash your hair, wash your hair first  as usual with you  normal shampoo.  3.  After you shampoo, rinse your hair and body thoroughly to remove the  Shampoo.  4.  Use CHG as you would any other liquid soap.  You can apply chg directly  to the skin and wash gently with scrungie or a clean washcloth.  5.  Apply the CHG Soap to your body ONLY FROM THE NECK DOWN.        Do not use on open wounds or open sores.  Avoid contact with you eyes,  ears, mouth and genitals (private parts).  Wash genitals (private parts)       with your normal soap.  6.  Wash thoroughly, paying special attention to the area where your surgery    will be performed.  7.  Thoroughly rinse your body with warm water from the neck down.  8.  DO NOT shower/wash with your normal soap after using and rinsing off   the CHG Soap.  9.  Pat yourself dry with a clean towel.            10.  Wear clean pajamas.            11.  Place clean sheets on your  bed the night of your first shower and do not  sleep with pets.  Day of Surgery  Do not apply any lotions/deoderants the morning of surgery.  Please wear clean clothes to the hospital/surgery center.     Please read over the following fact sheets that you were given: Pain Booklet, Coughing and Deep Breathing, Blood Transfusion Information, MRSA Information and Surgical Site Infection Prevention

## 2014-09-10 ENCOUNTER — Encounter (HOSPITAL_COMMUNITY): Payer: Self-pay

## 2014-09-10 ENCOUNTER — Encounter (HOSPITAL_COMMUNITY)
Admission: RE | Admit: 2014-09-10 | Discharge: 2014-09-10 | Disposition: A | Discharge status: Home / Self Care | Payer: 59 | Source: Ambulatory Visit | Attending: Vascular Surgery | Admitting: Vascular Surgery

## 2014-09-10 DIAGNOSIS — I2510 Atherosclerotic heart disease of native coronary artery without angina pectoris: Secondary | ICD-10-CM | POA: Insufficient documentation

## 2014-09-10 DIAGNOSIS — I10 Essential (primary) hypertension: Secondary | ICD-10-CM | POA: Diagnosis not present

## 2014-09-10 DIAGNOSIS — Z951 Presence of aortocoronary bypass graft: Secondary | ICD-10-CM | POA: Diagnosis not present

## 2014-09-10 DIAGNOSIS — Z01812 Encounter for preprocedural laboratory examination: Secondary | ICD-10-CM | POA: Diagnosis not present

## 2014-09-10 LAB — TYPE AND SCREEN
ABO/RH(D): A POS
ANTIBODY SCREEN: NEGATIVE

## 2014-09-10 LAB — BLOOD GAS, ARTERIAL
Acid-Base Excess: 5.3 mmol/L — ABNORMAL HIGH (ref 0.0–2.0)
BICARBONATE: 29.8 meq/L — AB (ref 20.0–24.0)
Drawn by: 42180
FIO2: 0.21 %
O2 Saturation: 90.4 %
PATIENT TEMPERATURE: 98.6
TCO2: 31.3 mmol/L (ref 0–100)
pCO2 arterial: 48.5 mmHg — ABNORMAL HIGH (ref 35.0–45.0)
pH, Arterial: 7.406 (ref 7.350–7.450)
pO2, Arterial: 62.1 mmHg — ABNORMAL LOW (ref 80.0–100.0)

## 2014-09-10 LAB — URINALYSIS, ROUTINE W REFLEX MICROSCOPIC
Bilirubin Urine: NEGATIVE
Glucose, UA: NEGATIVE mg/dL
HGB URINE DIPSTICK: NEGATIVE
Ketones, ur: NEGATIVE mg/dL
Leukocytes, UA: NEGATIVE
Nitrite: NEGATIVE
Protein, ur: NEGATIVE mg/dL
SPECIFIC GRAVITY, URINE: 1.015 (ref 1.005–1.030)
UROBILINOGEN UA: 1 mg/dL (ref 0.0–1.0)
pH: 7 (ref 5.0–8.0)

## 2014-09-10 LAB — CBC
HEMATOCRIT: 46 % (ref 39.0–52.0)
Hemoglobin: 14.8 g/dL (ref 13.0–17.0)
MCH: 30.6 pg (ref 26.0–34.0)
MCHC: 32.2 g/dL (ref 30.0–36.0)
MCV: 95 fL (ref 78.0–100.0)
Platelets: 109 10*3/uL — ABNORMAL LOW (ref 150–400)
RBC: 4.84 MIL/uL (ref 4.22–5.81)
RDW: 14.1 % (ref 11.5–15.5)
WBC: 9.8 10*3/uL (ref 4.0–10.5)

## 2014-09-10 LAB — APTT: aPTT: 36 seconds (ref 24–37)

## 2014-09-10 LAB — COMPREHENSIVE METABOLIC PANEL
ALBUMIN: 3.6 g/dL (ref 3.5–5.0)
ALT: 17 U/L (ref 17–63)
AST: 23 U/L (ref 15–41)
Alkaline Phosphatase: 91 U/L (ref 38–126)
Anion gap: 8 (ref 5–15)
BUN: 9 mg/dL (ref 6–20)
CO2: 27 mmol/L (ref 22–32)
CREATININE: 0.45 mg/dL — AB (ref 0.61–1.24)
Calcium: 9.1 mg/dL (ref 8.9–10.3)
Chloride: 102 mmol/L (ref 101–111)
GFR calc Af Amer: >60 mL/min (ref >60)
GFR calc non Af Amer: >60 mL/min (ref >60)
Glucose, Bld: 98 mg/dL (ref 70–99)
Potassium: 4.4 mmol/L (ref 3.5–5.1)
Sodium: 137 mmol/L (ref 135–145)
Total Bilirubin: 0.6 mg/dL (ref 0.3–1.2)
Total Protein: 6.5 g/dL (ref 6.5–8.1)

## 2014-09-10 LAB — PROTIME-INR
INR: 1.07 (ref 0.00–1.49)
Prothrombin Time: 14.1 seconds (ref 11.6–15.2)

## 2014-09-10 LAB — SURGICAL PCR SCREEN
MRSA, PCR: NEGATIVE
Staphylococcus aureus: NEGATIVE

## 2014-09-10 NOTE — Progress Notes (Signed)
Has not seen cardiologist since first of year,has note in [facility] from November clearing for surgery. No new symptoms or complaints of chest pain, does have SOB if walks a long way without his oxygen

## 2014-09-13 MED ORDER — DEXTROSE 5 % IV SOLN
1.5000 g | INTRAVENOUS | Status: AC
  Administered 2014-09-14: 1.5 g via INTRAVENOUS
  Filled 2014-09-13: qty 1.5

## 2014-09-13 NOTE — Progress Notes (Signed)
Anesthesia Chart Review: Patient is 74 year old male scheduled for abdominal aortic endovascular stent graft on 09/14/14 by Dr. Bridgett Larsson. Procedure was initially scheduled for 01/26/14, but was postponed due to insurance reasons and need to clarify the aneurysm size (measured at 5.0 by 08/13/14 CT ABD/PELVIS with 3D resconstruction).   PMH: AAA, CAD/MI s/p CABG 2000, ischemic CM, COPD with home O2, HTN, peripheral edema, hyperlipidemia, anxiety, depression, smoking. PCP is Dr. Sallee Lange. Cardiologist is Dr. Carlyle Dolly, who felt patient was at increased risk but not prohibitive for this procedure (see 03/16/14 note).   Medications include albuterol, Xanax, ASA, Symbicort, Celexa, Lasix, Norco, metoprolol succinate, , nitroglycerin, KCL, benazepril, Spiriva, atorvastatin.    03/16/14 EKG ([facility]): SR with first degree AVB, biatrial enlargement, RAD, LBBB.   2D Echo 12/29/13: Mild LVH with LVEF approximately 25-30%, wall motion abnormalities consistent with ischemic cardiomyopathy. Grade 1 diastolic dysfunction. Moderate to severe left atrial enlargement. Heavily calcified mitral annulus with mild mitral regurgitation. Moderately calcified aortic valve with evidence of mild aortic stenosis of relatively low gradient. There is trivial aortic regurgitation. Mild RV dilatation with moderately reduced contraction. Mildly dilated right atrium. PASP calculated at 52mm mercury. (EF 30-35% by 07/28/09 Echo.)  Stress Test 01/08/14: IMPRESSION:  1. Large area of scar in both the apex and also the inferolateral wall. There is no significant peri-infarct ischemia.  2. Severely reduced left ventricular systolic function. The LV is enlarged  3. Left ventricular ejection fraction 24%  4. High-risk stress test findings due to severe left ventricular systolic dysfunction and large area of scar. No current myocardium at jeopardy per 01/20/14 cardiology notes.   01/19/14 CXR: Stable COPD. Status post CABG. No active  disease.  Preoperative labs and ABG reviewed.  He will be further evaluated by his assigned anesthesiolgoist on the day of surgery to ensure no acute CV/CHF symptoms.  If none and otherwise not acute changes then I would anticipate that he could proceed as planned.   George Hugh [facility] Short Stay Center/Anesthesiology Phone [phone] 09/13/2014 9:45 AM

## 2014-09-14 ENCOUNTER — Inpatient Hospital Stay (HOSPITAL_COMMUNITY): Payer: 59

## 2014-09-14 ENCOUNTER — Encounter (HOSPITAL_COMMUNITY)
Admission: RE | Disposition: A | Discharge status: Home / Self Care | Payer: Self-pay | Source: Ambulatory Visit | Attending: Vascular Surgery

## 2014-09-14 ENCOUNTER — Inpatient Hospital Stay (HOSPITAL_COMMUNITY): Payer: 59 | Admitting: Certified Registered Nurse Anesthetist

## 2014-09-14 ENCOUNTER — Inpatient Hospital Stay (HOSPITAL_COMMUNITY): Payer: 59 | Admitting: Vascular Surgery

## 2014-09-14 ENCOUNTER — Encounter (HOSPITAL_COMMUNITY): Payer: Self-pay | Admitting: *Deleted

## 2014-09-14 ENCOUNTER — Inpatient Hospital Stay (HOSPITAL_COMMUNITY)
Admission: RE | Admit: 2014-09-14 | Discharge: 2014-09-15 | DRG: 269 | Disposition: A | Discharge status: Home / Self Care | Payer: 59 | Source: Ambulatory Visit | Attending: Vascular Surgery | Admitting: Vascular Surgery

## 2014-09-14 ENCOUNTER — Other Ambulatory Visit: Payer: Self-pay

## 2014-09-14 DIAGNOSIS — Z9981 Dependence on supplemental oxygen: Secondary | ICD-10-CM | POA: Diagnosis not present

## 2014-09-14 DIAGNOSIS — J449 Chronic obstructive pulmonary disease, unspecified: Secondary | ICD-10-CM | POA: Diagnosis present

## 2014-09-14 DIAGNOSIS — Z8249 Family history of ischemic heart disease and other diseases of the circulatory system: Secondary | ICD-10-CM

## 2014-09-14 DIAGNOSIS — R600 Localized edema: Secondary | ICD-10-CM | POA: Diagnosis present

## 2014-09-14 DIAGNOSIS — F329 Major depressive disorder, single episode, unspecified: Secondary | ICD-10-CM | POA: Diagnosis present

## 2014-09-14 DIAGNOSIS — I252 Old myocardial infarction: Secondary | ICD-10-CM

## 2014-09-14 DIAGNOSIS — Z9889 Other specified postprocedural states: Secondary | ICD-10-CM

## 2014-09-14 DIAGNOSIS — I10 Essential (primary) hypertension: Secondary | ICD-10-CM | POA: Diagnosis present

## 2014-09-14 DIAGNOSIS — Z48812 Encounter for surgical aftercare following surgery on the circulatory system: Secondary | ICD-10-CM

## 2014-09-14 DIAGNOSIS — F17210 Nicotine dependence, cigarettes, uncomplicated: Secondary | ICD-10-CM | POA: Diagnosis present

## 2014-09-14 DIAGNOSIS — Z7951 Long term (current) use of inhaled steroids: Secondary | ICD-10-CM | POA: Diagnosis not present

## 2014-09-14 DIAGNOSIS — Z951 Presence of aortocoronary bypass graft: Secondary | ICD-10-CM | POA: Diagnosis not present

## 2014-09-14 DIAGNOSIS — I714 Abdominal aortic aneurysm, without rupture, unspecified: Secondary | ICD-10-CM

## 2014-09-14 DIAGNOSIS — G8929 Other chronic pain: Secondary | ICD-10-CM | POA: Diagnosis present

## 2014-09-14 DIAGNOSIS — I2510 Atherosclerotic heart disease of native coronary artery without angina pectoris: Secondary | ICD-10-CM | POA: Diagnosis present

## 2014-09-14 DIAGNOSIS — Z79899 Other long term (current) drug therapy: Secondary | ICD-10-CM

## 2014-09-14 DIAGNOSIS — Z8679 Personal history of other diseases of the circulatory system: Secondary | ICD-10-CM

## 2014-09-14 DIAGNOSIS — F419 Anxiety disorder, unspecified: Secondary | ICD-10-CM | POA: Diagnosis present

## 2014-09-14 DIAGNOSIS — Z7982 Long term (current) use of aspirin: Secondary | ICD-10-CM | POA: Diagnosis not present

## 2014-09-14 DIAGNOSIS — E785 Hyperlipidemia, unspecified: Secondary | ICD-10-CM | POA: Diagnosis present

## 2014-09-14 DIAGNOSIS — M549 Dorsalgia, unspecified: Secondary | ICD-10-CM | POA: Diagnosis present

## 2014-09-14 DIAGNOSIS — Z95828 Presence of other vascular implants and grafts: Secondary | ICD-10-CM

## 2014-09-14 DIAGNOSIS — I509 Heart failure, unspecified: Secondary | ICD-10-CM | POA: Diagnosis not present

## 2014-09-14 HISTORY — PX: ABDOMINAL AORTIC ENDOVASCULAR STENT GRAFT: SHX5707

## 2014-09-14 LAB — CBC
HCT: 41.1 % (ref 39.0–52.0)
Hemoglobin: 12.9 g/dL — ABNORMAL LOW (ref 13.0–17.0)
MCH: 30.2 pg (ref 26.0–34.0)
MCHC: 31.4 g/dL (ref 30.0–36.0)
MCV: 96.3 fL (ref 78.0–100.0)
PLATELETS: 90 10*3/uL — AB (ref 150–400)
RBC: 4.27 MIL/uL (ref 4.22–5.81)
RDW: 14.5 % (ref 11.5–15.5)
WBC: 8 10*3/uL (ref 4.0–10.5)

## 2014-09-14 LAB — MAGNESIUM: Magnesium: 1.8 mg/dL (ref 1.7–2.4)

## 2014-09-14 LAB — PROTIME-INR
INR: 1.17 (ref 0.00–1.49)
Prothrombin Time: 15 seconds (ref 11.6–15.2)

## 2014-09-14 LAB — BASIC METABOLIC PANEL
Anion gap: 6 (ref 5–15)
BUN: 7 mg/dL (ref 6–20)
CALCIUM: 8.4 mg/dL — AB (ref 8.9–10.3)
CHLORIDE: 101 mmol/L (ref 101–111)
CO2: 34 mmol/L — ABNORMAL HIGH (ref 22–32)
Creatinine, Ser: 0.5 mg/dL — ABNORMAL LOW (ref 0.61–1.24)
Glucose, Bld: 99 mg/dL (ref 70–99)
Potassium: 4.2 mmol/L (ref 3.5–5.1)
Sodium: 141 mmol/L (ref 135–145)

## 2014-09-14 LAB — APTT: APTT: 37 s (ref 24–37)

## 2014-09-14 SURGERY — INSERTION, ENDOVASCULAR STENT GRAFT, AORTA, ABDOMINAL
Anesthesia: General

## 2014-09-14 MED ORDER — ATORVASTATIN CALCIUM 80 MG PO TABS
80.0000 mg | ORAL_TABLET | Freq: Every day | ORAL | Status: DC
  Administered 2014-09-14 – 2014-09-15 (×2): 80 mg via ORAL
  Filled 2014-09-14 (×2): qty 1

## 2014-09-14 MED ORDER — PHENYLEPHRINE HCL 10 MG/ML IJ SOLN
10.0000 mg | INTRAMUSCULAR | Status: DC | PRN
  Administered 2014-09-14: 20 ug/min via INTRAVENOUS

## 2014-09-14 MED ORDER — FENTANYL CITRATE (PF) 100 MCG/2ML IJ SOLN
INTRAMUSCULAR | Status: DC | PRN
  Administered 2014-09-14: 50 ug via INTRAVENOUS

## 2014-09-14 MED ORDER — ALPRAZOLAM 0.5 MG PO TABS: 0.5000 mg | ORAL_TABLET | Freq: Three times a day (TID) | ORAL | Status: DC | PRN

## 2014-09-14 MED ORDER — METOPROLOL SUCCINATE 12.5 MG HALF TABLET
12.5000 mg | ORAL_TABLET | Freq: Two times a day (BID) | ORAL | Status: DC
  Administered 2014-09-14 – 2014-09-15 (×2): 12.5 mg via ORAL
  Filled 2014-09-14 (×3): qty 1

## 2014-09-14 MED ORDER — POTASSIUM CHLORIDE CRYS ER 20 MEQ PO TBCR: 20.0000 meq | EXTENDED_RELEASE_TABLET | Freq: Every day | ORAL | Status: DC | PRN

## 2014-09-14 MED ORDER — HYDROCODONE-ACETAMINOPHEN 10-325 MG PO TABS
1.0000 | ORAL_TABLET | Freq: Two times a day (BID) | ORAL | Status: DC | PRN
Start: 2014-09-14 — End: 2014-09-14

## 2014-09-14 MED ORDER — ACETAMINOPHEN 325 MG PO TABS: 325.0000 mg | ORAL_TABLET | ORAL | Status: DC | PRN

## 2014-09-14 MED ORDER — SUCCINYLCHOLINE CHLORIDE 20 MG/ML IJ SOLN
INTRAMUSCULAR | Status: AC
  Filled 2014-09-14: qty 1

## 2014-09-14 MED ORDER — PROTAMINE SULFATE 10 MG/ML IV SOLN
INTRAVENOUS | Status: AC
  Filled 2014-09-14: qty 5

## 2014-09-14 MED ORDER — FUROSEMIDE 40 MG PO TABS
40.0000 mg | ORAL_TABLET | Freq: Two times a day (BID) | ORAL | Status: DC
  Administered 2014-09-14 – 2014-09-15 (×2): 40 mg via ORAL
  Filled 2014-09-14 (×4): qty 1

## 2014-09-14 MED ORDER — ACETAMINOPHEN 650 MG RE SUPP: 325.0000 mg | RECTAL | Status: DC | PRN

## 2014-09-14 MED ORDER — SODIUM CHLORIDE 0.9 % IV SOLN: 500.0000 mL | Freq: Once | INTRAVENOUS | Status: AC | PRN

## 2014-09-14 MED ORDER — PHENYLEPHRINE HCL 10 MG/ML IJ SOLN
INTRAMUSCULAR | Status: DC | PRN
  Administered 2014-09-14: 40 ug via INTRAVENOUS

## 2014-09-14 MED ORDER — OXYCODONE-ACETAMINOPHEN 5-325 MG PO TABS
1.0000 | ORAL_TABLET | ORAL | Status: DC | PRN
  Administered 2014-09-14 – 2014-09-15 (×2): 1 via ORAL
  Filled 2014-09-14 (×2): qty 1

## 2014-09-14 MED ORDER — ASPIRIN 81 MG PO TABS: 81.0000 mg | ORAL_TABLET | Freq: Every day | ORAL | Status: DC

## 2014-09-14 MED ORDER — FENTANYL CITRATE (PF) 100 MCG/2ML IJ SOLN: 25.0000 ug | INTRAMUSCULAR | Status: DC | PRN

## 2014-09-14 MED ORDER — LACTATED RINGERS IV SOLN
INTRAVENOUS | Status: DC
  Administered 2014-09-14 (×2): via INTRAVENOUS

## 2014-09-14 MED ORDER — ROCURONIUM BROMIDE 50 MG/5ML IV SOLN
INTRAVENOUS | Status: AC
  Filled 2014-09-14: qty 1

## 2014-09-14 MED ORDER — ASPIRIN EC 81 MG PO TBEC
81.0000 mg | DELAYED_RELEASE_TABLET | Freq: Every day | ORAL | Status: DC
  Administered 2014-09-14 – 2014-09-15 (×2): 81 mg via ORAL
  Filled 2014-09-14 (×2): qty 1

## 2014-09-14 MED ORDER — TIOTROPIUM BROMIDE MONOHYDRATE 18 MCG IN CAPS
18.0000 ug | ORAL_CAPSULE | Freq: Every day | RESPIRATORY_TRACT | Status: DC
  Administered 2014-09-15: 18 ug via RESPIRATORY_TRACT
  Filled 2014-09-14: qty 5

## 2014-09-14 MED ORDER — PHENOL 1.4 % MT LIQD: 1.0000 | OROMUCOSAL | Status: DC | PRN

## 2014-09-14 MED ORDER — EPHEDRINE SULFATE 50 MG/ML IJ SOLN
INTRAMUSCULAR | Status: DC | PRN
  Administered 2014-09-14 (×2): 5 mg via INTRAVENOUS
  Administered 2014-09-14: 10 mg via INTRAVENOUS
  Administered 2014-09-14 (×3): 5 mg via INTRAVENOUS

## 2014-09-14 MED ORDER — ALBUTEROL SULFATE HFA 108 (90 BASE) MCG/ACT IN AERS: 2.0000 | INHALATION_SPRAY | RESPIRATORY_TRACT | Status: DC | PRN

## 2014-09-14 MED ORDER — PHENYLEPHRINE 40 MCG/ML (10ML) SYRINGE FOR IV PUSH (FOR BLOOD PRESSURE SUPPORT)
PREFILLED_SYRINGE | INTRAVENOUS | Status: AC
  Filled 2014-09-14: qty 10

## 2014-09-14 MED ORDER — HYDRALAZINE HCL 20 MG/ML IJ SOLN: 5.0000 mg | INTRAMUSCULAR | Status: DC | PRN

## 2014-09-14 MED ORDER — EPHEDRINE SULFATE 50 MG/ML IJ SOLN
INTRAMUSCULAR | Status: AC
  Filled 2014-09-14: qty 1

## 2014-09-14 MED ORDER — PANTOPRAZOLE SODIUM 40 MG PO TBEC
40.0000 mg | DELAYED_RELEASE_TABLET | Freq: Every day | ORAL | Status: DC
  Administered 2014-09-14 – 2014-09-15 (×2): 40 mg via ORAL
  Filled 2014-09-14 (×2): qty 1

## 2014-09-14 MED ORDER — LABETALOL HCL 5 MG/ML IV SOLN
10.0000 mg | INTRAVENOUS | Status: DC | PRN
Start: 2014-09-14 — End: 2014-09-15

## 2014-09-14 MED ORDER — SODIUM CHLORIDE 0.9 % IJ SOLN
INTRAMUSCULAR | Status: AC
  Filled 2014-09-14: qty 10

## 2014-09-14 MED ORDER — ALUM & MAG HYDROXIDE-SIMETH 200-200-20 MG/5ML PO SUSP: 15.0000 mL | ORAL | Status: DC | PRN

## 2014-09-14 MED ORDER — ROCURONIUM BROMIDE 100 MG/10ML IV SOLN
INTRAVENOUS | Status: DC | PRN
  Administered 2014-09-14 (×2): 10 mg via INTRAVENOUS
  Administered 2014-09-14: 5 mg via INTRAVENOUS
  Administered 2014-09-14: 40 mg via INTRAVENOUS

## 2014-09-14 MED ORDER — METOPROLOL TARTRATE 1 MG/ML IV SOLN: 2.0000 mg | INTRAVENOUS | Status: DC | PRN

## 2014-09-14 MED ORDER — SODIUM CHLORIDE 0.9 % IV SOLN: INTRAVENOUS | Status: DC

## 2014-09-14 MED ORDER — BUDESONIDE-FORMOTEROL FUMARATE 160-4.5 MCG/ACT IN AERO
2.0000 | INHALATION_SPRAY | Freq: Two times a day (BID) | RESPIRATORY_TRACT | Status: DC
  Administered 2014-09-14 – 2014-09-15 (×2): 2 via RESPIRATORY_TRACT
  Filled 2014-09-14: qty 6

## 2014-09-14 MED ORDER — GUAIFENESIN-DM 100-10 MG/5ML PO SYRP: 15.0000 mL | ORAL_SOLUTION | ORAL | Status: DC | PRN

## 2014-09-14 MED ORDER — PROPOFOL 10 MG/ML IV BOLUS
INTRAVENOUS | Status: DC | PRN
  Administered 2014-09-14: 30 mg via INTRAVENOUS
  Administered 2014-09-14: 40 mg via INTRAVENOUS
  Administered 2014-09-14: 130 mg via INTRAVENOUS

## 2014-09-14 MED ORDER — ALBUTEROL SULFATE (2.5 MG/3ML) 0.083% IN NEBU: 2.5000 mg | INHALATION_SOLUTION | RESPIRATORY_TRACT | Status: DC | PRN

## 2014-09-14 MED ORDER — 0.9 % SODIUM CHLORIDE (POUR BTL) OPTIME
TOPICAL | Status: DC | PRN
  Administered 2014-09-14: 1000 mL

## 2014-09-14 MED ORDER — IODIXANOL 320 MG/ML IV SOLN
INTRAVENOUS | Status: DC | PRN
  Administered 2014-09-14: 85.7 mL via INTRAVENOUS

## 2014-09-14 MED ORDER — PROTAMINE SULFATE 10 MG/ML IV SOLN
INTRAVENOUS | Status: DC | PRN
  Administered 2014-09-14: 10 mg via INTRAVENOUS
  Administered 2014-09-14: 20 mg via INTRAVENOUS

## 2014-09-14 MED ORDER — MIDAZOLAM HCL 2 MG/2ML IJ SOLN
INTRAMUSCULAR | Status: AC
  Filled 2014-09-14: qty 2

## 2014-09-14 MED ORDER — ONDANSETRON HCL 4 MG/2ML IJ SOLN: 4.0000 mg | Freq: Four times a day (QID) | INTRAMUSCULAR | Status: DC | PRN

## 2014-09-14 MED ORDER — HEPARIN SODIUM (PORCINE) 1000 UNIT/ML IJ SOLN
INTRAMUSCULAR | Status: AC
  Filled 2014-09-14: qty 1

## 2014-09-14 MED ORDER — OXYCODONE HCL 5 MG PO TABS: 5.0000 mg | ORAL_TABLET | Freq: Once | ORAL | Status: DC | PRN

## 2014-09-14 MED ORDER — CHLORHEXIDINE GLUCONATE CLOTH 2 % EX PADS: 6.0000 | MEDICATED_PAD | Freq: Once | CUTANEOUS | Status: DC

## 2014-09-14 MED ORDER — DEXAMETHASONE SODIUM PHOSPHATE 4 MG/ML IJ SOLN
INTRAMUSCULAR | Status: AC
  Filled 2014-09-14: qty 1

## 2014-09-14 MED ORDER — GLYCOPYRROLATE 0.2 MG/ML IJ SOLN
INTRAMUSCULAR | Status: DC | PRN
  Administered 2014-09-14: .1 mg via INTRAVENOUS
  Administered 2014-09-14: 0.4 mg via INTRAVENOUS

## 2014-09-14 MED ORDER — SODIUM CHLORIDE 0.9 % IR SOLN
Status: DC | PRN
  Administered 2014-09-14: 10:00:00

## 2014-09-14 MED ORDER — MAGNESIUM SULFATE 2 GM/50ML IV SOLN: 2.0000 g | Freq: Every day | INTRAVENOUS | Status: DC | PRN

## 2014-09-14 MED ORDER — NITROGLYCERIN 0.4 MG SL SUBL: 0.4000 mg | SUBLINGUAL_TABLET | SUBLINGUAL | Status: DC | PRN

## 2014-09-14 MED ORDER — GLYCOPYRROLATE 0.2 MG/ML IJ SOLN
INTRAMUSCULAR | Status: AC
  Filled 2014-09-14: qty 2

## 2014-09-14 MED ORDER — OXYCODONE HCL 5 MG PO TABS: 5.0000 mg | ORAL_TABLET | Freq: Four times a day (QID) | ORAL | Status: DC | PRN

## 2014-09-14 MED ORDER — ONDANSETRON HCL 4 MG/2ML IJ SOLN
INTRAMUSCULAR | Status: DC | PRN
  Administered 2014-09-14: 4 mg via INTRAVENOUS

## 2014-09-14 MED ORDER — MORPHINE SULFATE 2 MG/ML IJ SOLN
2.0000 mg | INTRAMUSCULAR | Status: DC | PRN
  Administered 2014-09-14: 2 mg via INTRAVENOUS
  Filled 2014-09-14: qty 1

## 2014-09-14 MED ORDER — PROPOFOL 10 MG/ML IV BOLUS
INTRAVENOUS | Status: AC
  Filled 2014-09-14: qty 20

## 2014-09-14 MED ORDER — DOCUSATE SODIUM 100 MG PO CAPS: 100.0000 mg | ORAL_CAPSULE | Freq: Every day | ORAL | Status: DC

## 2014-09-14 MED ORDER — ENOXAPARIN SODIUM 40 MG/0.4ML ~~LOC~~ SOLN
40.0000 mg | SUBCUTANEOUS | Status: DC
  Filled 2014-09-14: qty 0.4

## 2014-09-14 MED ORDER — NEOSTIGMINE METHYLSULFATE 10 MG/10ML IV SOLN
INTRAVENOUS | Status: AC
  Filled 2014-09-14: qty 1

## 2014-09-14 MED ORDER — LIDOCAINE HCL 4 % MT SOLN
OROMUCOSAL | Status: DC | PRN
  Administered 2014-09-14: 4 mL via TOPICAL

## 2014-09-14 MED ORDER — FENTANYL CITRATE (PF) 250 MCG/5ML IJ SOLN
INTRAMUSCULAR | Status: AC
  Filled 2014-09-14: qty 5

## 2014-09-14 MED ORDER — LIDOCAINE HCL (CARDIAC) 20 MG/ML IV SOLN
INTRAVENOUS | Status: DC | PRN
  Administered 2014-09-14: 60 mg via INTRAVENOUS

## 2014-09-14 MED ORDER — LIDOCAINE HCL (CARDIAC) 20 MG/ML IV SOLN
INTRAVENOUS | Status: AC
  Filled 2014-09-14: qty 5

## 2014-09-14 MED ORDER — BENAZEPRIL HCL 40 MG PO TABS
40.0000 mg | ORAL_TABLET | Freq: Every day | ORAL | Status: DC
  Administered 2014-09-15: 40 mg via ORAL
  Filled 2014-09-14 (×2): qty 1

## 2014-09-14 MED ORDER — ALBUTEROL SULFATE HFA 108 (90 BASE) MCG/ACT IN AERS
INHALATION_SPRAY | RESPIRATORY_TRACT | Status: DC | PRN
  Administered 2014-09-14: 8 via RESPIRATORY_TRACT

## 2014-09-14 MED ORDER — CEFUROXIME SODIUM 1.5 G IJ SOLR
1.5000 g | Freq: Two times a day (BID) | INTRAMUSCULAR | Status: AC
  Administered 2014-09-14 – 2014-09-15 (×2): 1.5 g via INTRAVENOUS
  Filled 2014-09-14 (×2): qty 1.5

## 2014-09-14 MED ORDER — HEPARIN SODIUM (PORCINE) 1000 UNIT/ML IJ SOLN
INTRAMUSCULAR | Status: DC | PRN
  Administered 2014-09-14: 6000 [IU] via INTRAVENOUS

## 2014-09-14 MED ORDER — SODIUM CHLORIDE 0.9 % IV SOLN
INTRAVENOUS | Status: DC
  Administered 2014-09-14: 15:00:00 via INTRAVENOUS

## 2014-09-14 MED ORDER — OXYCODONE HCL 5 MG/5ML PO SOLN: 5.0000 mg | Freq: Once | ORAL | Status: DC | PRN

## 2014-09-14 MED ORDER — CITALOPRAM HYDROBROMIDE 20 MG PO TABS
20.0000 mg | ORAL_TABLET | Freq: Every day | ORAL | Status: DC
  Administered 2014-09-14: 20 mg via ORAL
  Filled 2014-09-14 (×2): qty 1

## 2014-09-14 MED ORDER — NEOSTIGMINE METHYLSULFATE 10 MG/10ML IV SOLN
INTRAVENOUS | Status: DC | PRN
  Administered 2014-09-14: 3 mg via INTRAVENOUS

## 2014-09-14 SURGICAL SUPPLY — 63 items
CANISTER SUCTION 2500CC (MISCELLANEOUS) ×3 IMPLANT
CATH BEACON 5 .035 65 C2 TIP (CATHETERS) ×2 IMPLANT
CATH BEACON 5.038 65CM KMP-01 (CATHETERS) ×2 IMPLANT
CATH HEADHUNTER 5FR 65CM (MISCELLANEOUS) ×2 IMPLANT
CATH OMNI FLUSH .035X70CM (CATHETERS) ×2 IMPLANT
COVER PROBE W GEL 5X96 (DRAPES) ×1 IMPLANT
DEVICE CLOSURE PERCLS PRGLD 6F (VASCULAR PRODUCTS) IMPLANT
DRSG TEGADERM 2-3/8X2-3/4 SM (GAUZE/BANDAGES/DRESSINGS) ×6 IMPLANT
DRYSEAL FLEXSHEATH 12FR 33CM (SHEATH) ×2
DRYSEAL FLEXSHEATH 18FR 33CM (SHEATH) ×2
ELECT CAUTERY BLADE 6.4 (BLADE) ×2 IMPLANT
ELECT REM PT RETURN 9FT ADLT (ELECTROSURGICAL) ×6
ELECTRODE REM PT RTRN 9FT ADLT (ELECTROSURGICAL) ×2 IMPLANT
EXCLUDER TNK LEG 35MX14X14 (Endovascular Graft) IMPLANT
EXCLUDER TRUNK LEG 35MX14X14 (Endovascular Graft) ×3 IMPLANT
GAUZE SPONGE 2X2 8PLY STRL LF (GAUZE/BANDAGES/DRESSINGS) ×1 IMPLANT
GAUZE SPONGE 4X4 16PLY XRAY LF (GAUZE/BANDAGES/DRESSINGS) ×2 IMPLANT
GLOVE BIO SURGEON STRL SZ7 (GLOVE) ×5 IMPLANT
GLOVE BIO SURGEON STRL SZ7.5 (GLOVE) ×2 IMPLANT
GLOVE BIOGEL PI IND STRL 6.5 (GLOVE) IMPLANT
GLOVE BIOGEL PI IND STRL 7.5 (GLOVE) ×1 IMPLANT
GLOVE BIOGEL PI IND STRL 8 (GLOVE) IMPLANT
GLOVE BIOGEL PI INDICATOR 6.5 (GLOVE) ×2
GLOVE BIOGEL PI INDICATOR 7.5 (GLOVE) ×4
GLOVE BIOGEL PI INDICATOR 8 (GLOVE) ×2
GLOVE ECLIPSE 6.5 STRL STRAW (GLOVE) ×2 IMPLANT
GOWN STRL REUS W/ TWL LRG LVL3 (GOWN DISPOSABLE) ×3 IMPLANT
GOWN STRL REUS W/TWL LRG LVL3 (GOWN DISPOSABLE) ×12
GRAFT BALLN CATH 65CM (STENTS) IMPLANT
KIT BASIN OR (CUSTOM PROCEDURE TRAY) ×3 IMPLANT
KIT ROOM TURNOVER OR (KITS) ×3 IMPLANT
LEG CONTRALATERAL 16X16X9.5 (Endovascular Graft) ×2 IMPLANT
LEG CONTRALATERAL 16X20X9.5 (Endovascular Graft) ×3 IMPLANT
LIQUID BAND (GAUZE/BANDAGES/DRESSINGS) ×5 IMPLANT
NDL PERC 18GX7CM (NEEDLE) ×1 IMPLANT
NEEDLE PERC 18GX7CM (NEEDLE) ×3 IMPLANT
NS IRRIG 1000ML POUR BTL (IV SOLUTION) ×3 IMPLANT
PACK ENDOVASCULAR (PACKS) ×3 IMPLANT
PAD ARMBOARD 7.5X6 YLW CONV (MISCELLANEOUS) ×6 IMPLANT
PENCIL BUTTON HOLSTER BLD 10FT (ELECTRODE) ×2 IMPLANT
PERCLOSE PROGLIDE 6F (VASCULAR PRODUCTS) ×15
SHEATH AVANTI 11CM 8FR (MISCELLANEOUS) ×2 IMPLANT
SHEATH BRITE TIP 8FR 23CM (MISCELLANEOUS) ×2 IMPLANT
SHEATH DRYSEAL FLEX 12FR 33CM (SHEATH) IMPLANT
SHEATH DRYSEAL FLEX 18FR 33CM (SHEATH) IMPLANT
SPONGE GAUZE 2X2 STER 10/PKG (GAUZE/BANDAGES/DRESSINGS) ×4
STAPLER VISISTAT 35W (STAPLE) IMPLANT
STENT GRAFT BALLN CATH 65CM (STENTS) ×2
STENT GRAFT CONTRALAT 16X20X9. (Endovascular Graft) IMPLANT
STOPCOCK MORSE 400PSI 3WAY (MISCELLANEOUS) ×3 IMPLANT
SUT ETHILON 3 0 PS 1 (SUTURE) IMPLANT
SUT MNCRL AB 4-0 PS2 18 (SUTURE) ×6 IMPLANT
SUT PROLENE 5 0 C 1 24 (SUTURE) IMPLANT
SUT VIC AB 2-0 CTX 36 (SUTURE) IMPLANT
SUT VIC AB 3-0 SH 27 (SUTURE) ×3
SUT VIC AB 3-0 SH 27X BRD (SUTURE) IMPLANT
SYR 30ML LL (SYRINGE) ×3 IMPLANT
TRAY FOLEY CATH 16FRSI W/METER (SET/KITS/TRAYS/PACK) ×3 IMPLANT
TUBING HIGH PRESSURE 120CM (CONNECTOR) ×3 IMPLANT
WIRE AMPLATZ SS-J .035X180CM (WIRE) ×4 IMPLANT
WIRE BENTSON .035X145CM (WIRE) ×4 IMPLANT
WIRE LUNDERQUIST .035X180CM (WIRE) ×2 IMPLANT
YANKAUER SUCT BULB TIP NO VENT (SUCTIONS) ×2 IMPLANT

## 2014-09-14 NOTE — Anesthesia Procedure Notes (Signed)
Procedure Name: Intubation Date/Time: 09/14/2014 10:01 AM Performed by: Merdis Delay Pre-anesthesia Checklist: Patient identified, Suction available, Patient being monitored, Emergency Drugs available and Timeout performed Patient Re-evaluated:Patient Re-evaluated prior to inductionOxygen Delivery Method: Circle system utilized Preoxygenation: Pre-oxygenation with 100% oxygen Intubation Type: IV induction Ventilation: Mask ventilation without difficulty and Oral airway inserted - appropriate to patient size Laryngoscope Size: Mac and 4 Grade View: Grade II Tube type: Oral Tube size: 7.5 mm Number of attempts: 1 Airway Equipment and Method: Stylet and LTA kit utilized Placement Confirmation: ETT inserted through vocal cords under direct vision,  positive ETCO2,  CO2 detector and breath sounds checked- equal and bilateral Secured at: 21 cm Tube secured with: Tape Dental Injury: Teeth and Oropharynx as per pre-operative assessment

## 2014-09-14 NOTE — Op Note (Addendum)
OPERATIVE NOTE   PROCEDURE: 1. Bilateral common femoral artery cannulation under ultrasound guidance 2. "Preclose" repair of bilateral common femoral artery  3. Placement of catheter in aorta x 2 4. Aortogram 5. Placement of bifurcated aortic endograft (35 mm x 14 mm x 14 cm) 6. Placement of left limb extension (20 mm x 9.5 cm) 7. Placement of left iliac limb (16 mm x 9.5 cm) 8. Radiologic S&I  PRE-OPERATIVE DIAGNOSIS: rapidly enlarging abdominal aortic aneurysm   POST-OPERATIVE DIAGNOSIS: same as above   CO-SURGEONS: Adele Barthel, MD; Dr. Gae Gallop  ANESTHESIA: general  ESTIMATED BLOOD LOSS: 50 cc  CONTRAST: 70 cc  FINDING(S): 1. Successful exclusion of abdominal aortic aneurysm with preservation of bilateral renal arteries and internal iliac arteries 2. No obvious endoleaks 3. Dopplerable bilateral dorsalis pedis artery   SPECIMEN(S): none  INDICATIONS:  Tommy Turner is a 74 y.o. male with rapidly enlarging abdominal aortic aneurysm (4.5 mm to 5.0 mm in <6 months) presents for endovascular aortic repair.  The patient is aware the risks of endovascular aortic surgery include but are not limited to: bleeding, need for transfusion, infection, death, stroke, paralysis, wound complications, bowel ischemia, extended ventilation, anaphylactic reaction to contrast, contrast induced nephropathy, embolism, and need for additional procedure to address endoleaks.  Overall, I cited a mortality rate of 1-2% and morbidity rate of 15%.  DESCRIPTION: After obtaining full informed written consent, the patient was brought back to the operating room and placed supine upon the operating table. The patient received IV antibiotics prior to induction. After obtaining adequate anesthesia, the patient was prepped and draped in the standard fashion for: aortic and endovascular aortic repair.   At this point, I turned my attention to the left groin. Under Sonosite guidance, I  cannulated the left common femoral artery. A Benson wire was passed into the aorta. The needle was exchanged for a Proglide device, which was deployed with medial rotation. The suture broke so I replaced the wire into the device and exchanged it for a new Proglide.  I removed the old suture with the old device. Unfortunately, this was repeated with another 2 Proglide devices, which also broken their sutures.  Eventually, I deployed the device in straight anteroposterior orientation.  The proglide sutures were removed and tagged. The [facility] wire was reloaded through the used Proglide device. The device was exchanged for a new one. The Proglide device was deployed with a lateral rotation. The proglide sutures were removed and tagged. The [facility] wire was reloaded through the used Proglide device. A short 8-Fr sheath was loaded in the left common femoral artery.  Dr. Scot Dock repeated this Preclose technique on the right common femoral artery.  Fortunately, no device failures occurred on this side.  The long 8-Fr sheath was loaded over a Benson wire.  I then placed a KMP catheter over the left wire and steered into the suprarenal aorta.  I exchanged the wire for an Amplatz wire.  I then removed the catheter.  Dr. Scot Dock loaded a  pigtail catheter over the right wire and then the wire and catheter was steered into the suprarenal aorta.  The wire was exchanged for an Amplatz wire.  Dr. Scot Dock exchanged the right sheath for a 12-Fr Dryseal sheath.    I then exchanged the left femoral sheath was exchanged for a 18-Fr Drysheal sheath, which was placed under fluoroscopic guidance without difficulty. A pigtail catheter was loaded over the right wire into the suprarenal position.  I then  loaded the main body (35 mm x 14 mm x 14 cm) through the left sheath and placed it in the immediate infrarenal position  A power injector aortogram was completed to identify the location of the left renal artery, the lowest  renal artery. I then deployed the main body.  It appeared to have settled into a bulbous area of the right aortic wall in the infrarenal position.  Completion aortogram demonstrated patency over both renal arteries with the expected down tick of the graft on the right side, ~1 cm.  I reconstrained the wire and tried to push the main graft above the bulbous segment.  It did advance a few millimeter more proximally.  I then reconstrained the graft and pushed it even more proximally.  I then released the graft and did a completion aortogram.  This appeared to to decreased the right side drop to ~5 mm with left graft ~2-3 mm away from the left aortic wall.  I expected this fully appose once the top of the graft is molded.  At this point, Dr. Scot Dock put an Amplatz wire into the right pigtail catheter and removed the catheter. He placed a buddy wire, Benson wire, through the right femoral sheath with a 8-Fr dilator. He briefly put a C2 catheter over the wire, but it appeared to be too much distance between the right common iliac artery and the contralateral gate.  The catheter was removed and placed over the wire and the wire was exchanged for a Lundiquist wire to try to pull the right common iliac artery closer to the contralateral gate.  Using a C2 catheter and the [facility] wire, he cannulated the contralateral gate. He  advanced the catheter and wire into the suprarenal aorta. The wire was exchanged for an Lundiquist wire. The catheter was exchanged for a pigtail catheter. The wire was pulled down and the catheter was rotated at the top of the endograft to demonstrated intragraft position of the catheter. The Lundiquest wire was replaced in this catheter and the catheter pulled down the flow divider.   A retrograde injection from the right femoral sheath demonstrated the position of the right internal iliac artery. Based on the length measurements, a right iliac limb (16 mm x 9.5 cm) was selected. Dr.  Scot Dock deployed this with adequate overlap and the stent delivery device was removed.   At this point, I full deployed the ipsilateral limb and released the main body. The stent delivery device was removed. A retrograde injection was completed via left femoral sheath. Based on the measurements, a 20 mm x 9.5 cm bell-bottom iliac extension limb was selected.  I deployed this limb extension with adequate overlap and removed the stent delivery device.  At this point, I passed the Q50 balloon up the right sheath and inflated the molding balloon at the proximal aortic endograft, at the flow divider and at the end of the right iliac limb. The balloon was deflated and placed on the left wire. I molding the overlapping points of the limbs and the distal left iliac limb.  A pigtail catheter was loaded on the right side and a completion aortogram was completed. Based on the images, there appeared to be no endoleak. The graft was well apposed: right side in the graft against the bulbous segment with ~3-5 mm drop from right renal artery, Left side was ~1 mm below the left renal artery with good wall apposition despite concerns previously.  No endoleak was present.  Both renal and  internal iliac arteries were patent.    At this point, I felt no further intervention was needed. The pigtail catheter was removed over a Benson wire on the right side.  I replaced the [facility] wire on the right to straighten out the right pigtail catheter.  I removed the catheter.  Using the knot pusher, the Proglide sutures in the left groin were sequentially tightened with the wire in place. There was minimal bleeding at this point, so I removed the wire. I then completed a second round of sequential suture tightening and applied tension to the left Proglide sutures.  In a similar fashion, Dr. Scot Dock repaired the right common femoral artery after removing the right femora sheath.  I gave the patient 30 mg of Protamine to reverse  anticoagulation. I repaired the subcutaneous tissue in the left groin with a running stitch of 3-0 Vicryl.  The skin in left groin was reapproximated with a running subcuticular stitch of 4-0 Monocryl.  I then repaired the skin in right groin with a U-stitch of 4-0 Monocryl.  Distally there were dopplerable bilateral right dorsalis pedis artery signals.  Note: a two surgeon approach was necessary in this case due to the complexity of the case and need for a surgeon to maintain main body stabilization until the positioning barbs were deployed with aortic molding as part of the end of the case.   COMPLICATIONS: none  CONDITION: stable   Adele Barthel, MD [facility] Office: [phone] Pager: [phone]

## 2014-09-14 NOTE — Transfer of Care (Signed)
Immediate Anesthesia Transfer of Care Note  Patient: Tommy Turner  Procedure(s) Performed: Procedure(s): ABDOMINAL AORTIC ENDOVASCULAR STENT GRAFT (N/A)  Patient Location: PACU  Anesthesia Type:General  Level of Consciousness: sedated  Airway & Oxygen Therapy: Patient Spontanous Breathing and Patient connected to nasal cannula oxygen  Post-op Assessment: Report given to RN and Post -op Vital signs reviewed and stable  Post vital signs: Reviewed and stable  Last Vitals:  Filed Vitals:   09/14/14 0720  BP: 141/76  Pulse: 79  Temp: 36.6 C  Resp: 16    Complications: No apparent anesthesia complications   Moving all extremities. Opens eyes to name. VSS.

## 2014-09-14 NOTE — Anesthesia Postprocedure Evaluation (Signed)
  Anesthesia Post-op Note  Patient: Tommy Turner  Procedure(s) Performed: Procedure(s): ABDOMINAL AORTIC ENDOVASCULAR STENT GRAFT (N/A)  Patient Location: PACU  Anesthesia Type:General  Level of Consciousness: awake  Airway and Oxygen Therapy: Patient Spontanous Breathing  Post-op Pain: none  Post-op Assessment: Post-op Vital signs reviewed, Patient's Cardiovascular Status Stable, Respiratory Function Stable, Patent Airway, No signs of Nausea or vomiting and Pain level controlled  Post-op Vital Signs: Reviewed and stable  Last Vitals:  Filed Vitals:   09/14/14 1525  BP:   Pulse:   Temp: 36.4 C  Resp:     Complications: No apparent anesthesia complications

## 2014-09-14 NOTE — H&P (View-Only) (Signed)
Established Abdominal Aortic Aneurysm  History of Present Illness  The patient is a 74 y.o. ([birth year]) male who presents with chief complaint: follow up for AAA.  Prior request for EVAR was blocked by [facility] as reported the AAA was only 4.5 cm.  I did not agree with the read, unfortunately the raw data from the CT had been dumped so 3D reconstruction could not be done with centerline measurements.  The patient returns 6 month later with repeat CTA abd/pelvis with 3D reconstruction.  The patient has chronic back pain.  The patient is a smoker.  Pt continues to have anxiety with risk of rupture of his AAA which causes him to lose sleep.  Past Medical History  Diagnosis Date  . AAA (abdominal aortic aneurysm)   . Hyperlipidemia     takes Atorvastatin daily  . CAD (coronary artery disease)   . Ulcer     gastric  . Aneurysm   . Anxiety     takes Xanax daily as needed  . Depression     takes Celexa daily  . Hypertension     takes Metoprolol and Benazepril  daily  . Peripheral edema     takes Furosemide daily  . Myocardial infarction     20+yrs ago  . COPD (chronic obstructive pulmonary disease)     Albuterol inhaler and neb daily as needed;takes SPiriva daily  . Shortness of breath     with exertion  . Headache(784.0)     occasionally  . History of shingles   . CHF (congestive heart failure)   . Cancer     Past Surgical History  Procedure Laterality Date  . Lipoma excision      right lower back  . Partial gastrectomy      approx 1990  . Coronary artery bypass graft  20+yrs ago    x 6  . Hernia repair Left     inguinal  . Tonsillectomy      as a child  . Esophagogastroduodenoscopy      History   Social History  . Marital Status: Married    Spouse Name: N/A  . Number of Children: N/A  . Years of Education: N/A   Occupational History  . Not on file.   Social History Main Topics  . Smoking status: Current Every Day Smoker -- 0.50 packs/day for 58 years   Types: Cigarettes  . Smokeless tobacco: Current User     Comment: vapor ciggs  . Alcohol Use: No  . Drug Use: No  . Sexual Activity: Not Currently   Other Topics Concern  . Not on file   Social History Narrative    Family History  Problem Relation Age of Onset  . Hypertension Brother   . Heart disease Brother     Heart Disease before age 60  . Heart attack Brother   . Cancer Brother   . Cancer Mother   . Diabetes Mother   . Heart disease Mother     Heart Disease before age 88  . Hypertension Mother   . Heart attack Mother   . Cancer Father     prostate  . Hypertension Father   . COPD Sister   . Diabetes Sister   . Heart disease Sister   . Hypertension Sister   . Heart attack Sister   . Colon cancer Neg Hx     Current Outpatient Prescriptions on File Prior to Visit  Medication Sig Dispense Refill  . albuterol (PROVENTIL) (  2.5 MG/3ML) 0.083% nebulizer solution USE 1 VIAL VIA NEBULIZER EVERY 4 HOURS AS NEEDED 150 mL 5  . ALPRAZolam (XANAX) 1 MG tablet Take 0.5-1 mg by mouth 3 (three) times daily as needed for anxiety (Takes one-half tablet in the morning and afternoon. takes one tablet at bedtime daily).    Marland Kitchen aspirin 81 MG tablet Take 81 mg by mouth daily.    Marland Kitchen atorvastatin (LIPITOR) 80 MG tablet Take 1 tablet (80 mg total) by mouth daily. 30 tablet 6  . benazepril (LOTENSIN) 40 MG tablet Take 1 tablet (40 mg total) by mouth daily. 90 tablet 3  . budesonide-formoterol (SYMBICORT) 160-4.5 MCG/ACT inhaler Inhale 2 puffs into the lungs 2 (two) times daily. 1 Inhaler 0  . citalopram (CELEXA) 20 MG tablet Take 20 mg by mouth at bedtime.    . citalopram (CELEXA) 20 MG tablet TAKE 1/2 TABLET BY MOUTH AT BEDTIME FOR 6 DAYS, THEN 1 TABLET AT BEDTIME 30 tablet 0  . furosemide (LASIX) 40 MG tablet Take 1 tablet (40 mg total) by mouth 2 (two) times daily. 60 tablet 6  . HYDROcodone-acetaminophen (NORCO) 10-325 MG per tablet Take 1 tablet by mouth 2 (two) times daily as needed (pain).  60 tablet 0  . metoprolol succinate (TOPROL-XL) 25 MG 24 hr tablet Take 0.5 tablets (12.5 mg total) by mouth 2 (two) times daily. 90 tablet 3  . nitroGLYCERIN (NITROSTAT) 0.4 MG SL tablet Place 1 tablet (0.4 mg total) under the tongue every 5 (five) minutes as needed for chest pain. 100 tablet 0  . potassium chloride SA (K-DUR,KLOR-CON) 20 MEQ tablet Take 1 tablet (20 mEq total) by mouth daily. 90 tablet 3  . tiotropium (SPIRIVA) 18 MCG inhalation capsule Place 1 capsule (18 mcg total) into inhaler and inhale daily. 90 capsule 3  . UNABLE TO FIND Oxygen 2 liters.    . VENTOLIN HFA 108 (90 BASE) MCG/ACT inhaler INHALE 2 PUFFS BY MOUTH EVERY 4-6 HOURS AS NEEDED 18 g 5  . doxycycline (VIBRAMYCIN) 100 MG capsule Take 1 capsule (100 mg total) by mouth 2 (two) times daily. (Patient not taking: Reported on 08/27/2014) 20 capsule 0   No current facility-administered medications on file prior to visit.    No Known Allergies  REVIEW OF SYSTEMS:  (Positives checked otherwise negative)  CARDIOVASCULAR:  []  chest pain, []  chest pressure, []  palpitations, []  shortness of breath when laying flat, []  shortness of breath with exertion,  []  pain in feet when walking, []  pain in feet when laying flat, []  history of blood clot in veins (DVT), []  history of phlebitis, []  swelling in legs, []  varicose veins  PULMONARY:  []  productive cough, []  asthma, []  wheezing  NEUROLOGIC:  []  weakness in arms or legs, []  numbness in arms or legs, []  difficulty speaking or slurred speech, []  temporary loss of vision in one eye, []  dizziness  HEMATOLOGIC:  []  bleeding problems, []  problems with blood clotting too easily  MUSCULOSKEL:  []  joint pain, []  joint swelling  GASTROINTEST:  []  vomiting blood, []  blood in stool     GENITOURINARY:  []  burning with urination, []  blood in urine  PSYCHIATRIC:  []  history of major depression, [x]  anxiety over aneurysm  INTEGUMENTARY:  []  rashes, []  ulcers   Physical  Examination  Filed Vitals:   08/27/14 1202  BP: 128/67  Pulse: 64  Height: 5\' 7"  (1.702 m)  Weight: 151 lb 8 oz (68.72 kg)  SpO2: 92%   Body mass index is  23.72 kg/(m^2).  General: A&O x 3, WD, thin  Pulmonary: Sym exp, good air movt, CTAB, no rales, rhonchi, & wheezing  Cardiac: RRR, Nl S1, S2, no Murmurs, rubs or gallops  Vascular: Vessel Right Left  Radial Palpable Palpable  Brachial Palpable Palpable  Carotid Palpable, without bruit Palpable, without bruit  Aorta AAA palpable N/A  Femoral Palpable Palpable  Popliteal Not palpable Not palpable  PT Palpable Palpable  DP Faintly Palpable Faintly Palpable   Gastrointestinal: soft, NTND, -G/R, - HSM, - masses, - CVAT B  Musculoskeletal: M/S 5/5 throughout , Extremities without ischemic changes   Neurologic: CN 2-12 intact , Pain and light touch intact in extremities , Motor exam as listed above  Radiology: CTA Abd/Pelvis (08/13/14) Infrarenal abdominal aortic aneurysm with a maximal transverse diameter of 5.0 cm as described above.  Medical Decision Making  The patient is a 74 y.o. male who presents with: asymptomatic large AAA with increasing size, anxiety due AAA, R kidney lesion   Aneurysm is now 5.0 cm, which represents 0.5 cm increase over the reportedly 4.5 cm cited by [facility].  This meets the growth criteria for AAA repair.  Based on this patient's exam and diagnostic studies, he needs EVAR.  Tentatively, this patient is scheduled for EVAR on 10 MAY 16. The patient is aware the risks of endovascular aortic surgery include but are not limited to: bleeding, need for transfusion, infection, death, stroke, paralysis, wound complications, bowel ischemia, extended ventilation, anaphylactic reaction to contrast, contrast induced nephropathy, embolism, and need for additional procedure to address endoleaks.   Overall, I cited a mortality rate of 1-2% and morbidity rate of 15%.  I emphasized the importance of maximal medical  management including strict control of blood pressure, blood glucose, and lipid levels, antiplatelet agents, obtaining regular exercise, and cessation of smoking.    Thank you for allowing [place] to participate in this patient's care.  Adele Barthel, MD [facility] [place] Office: [phone] Pager: [phone]  08/27/2014, 12:32 PM

## 2014-09-14 NOTE — Op Note (Signed)
    NAME: Tommy Turner   MRN: [id number] DOB: 20-Aug-1940    DATE OF OPERATION: 09/14/2014  PREOP DIAGNOSIS: Abdominal aortic aneurysm  POSTOP DIAGNOSIS: same  PROCEDURE:  1. Cannulation of right common femoral artery under ultrasound guidance and pre-closure of right common femoral artery 2. Placement of right iliac limb (16 mm x 9.5 mm).  CO SURGEON's: Judeth Cornfield. Scot Dock, MD, Adele Barthel, MD  ANESTHESIA: Gen.   EBL: per anesthesia records  INDICATIONS: [name] is a 74 y.o. male who presented with a rapidly enlarging abdominal aortic aneurysm.  FINDINGS: the right common femoral artery did not have significant plaque by ultrasound.  TECHNIQUE: The patient was taken to the operating room and received a general anesthetic. Monitoring lines had been placed by anesthesia. On the left side, Dr. Bridgett Larsson preclosed the left common femoral artery and all shown guidance and the bifurcated aortic endograft was placed from the left side as dictated. On the right side, under ultrasound guidance, the right common femoral artery was cannulated and a guidewire introduced into the infrarenal aorta under fluoroscopic control. The tract over the wire was dilated and then the initial Perclose device was rotated slightly laterally. The second Perclose device was rotated slightly medially. Once the artery had been preclosed, a long 8 French sheath was placed on the right side. This was later exchanged for a 12 French sheath over an Amplatz wire. We anticipated that the contralateral gate would be difficult to cannulate and therefore we used the "buddy wire" technique. After the aortic bifurcated endograft was placed from the left side, the Amplatz wire was exchanged for a Lunderquist wire . I then cannulated the sheath using an 8 [place] dilator to introduce the [employer] wire. I was then able to use a C2 catheter to cannulate the contralateral gate. A pigtail catheter was then advanced up the wire internal  within the endoprosthesis to be sure that we were in the correct position. The wire was then advanced through the catheter. A retrograde right iliac arteriogram was obtained to measure the correct length and a 16 mm x 9.5 mm iliac limb was selected. This was introduced through the sheath over the Amplatz wire and positioned into the contralateral gate and deployed without difficulty.  The left iliac limb was extended as dictated by Dr. Adele Barthel. The proximal graft and overlap regions were ballooned as described also. At the completion of the procedure, after the left common femoral artery was closed using the pre-close device, the right sheath was removed and the Perclose devices secured with good hemostasis after the wire was removed.  Deitra Mayo, MD, FACS [facility]  DATE OF DICTATION:   09/14/2014

## 2014-09-14 NOTE — Plan of Care (Signed)
Problem: Consults Goal: Diagnosis CEA/CES/AAA Stent Outcome: Completed/Met Date Met:  09/14/14 Abdominal Aortic Aneurysm Stent (AAA)

## 2014-09-14 NOTE — Anesthesia Preprocedure Evaluation (Addendum)
Anesthesia Evaluation  Patient identified by MRN, date of birth, ID band Patient awake    Reviewed: Allergy & Precautions, NPO status , Patient's Chart, lab work & pertinent test results  Airway Mallampati: II  TM Distance: >3 FB Neck ROM: Full    Dental  (+) Edentulous Upper, Edentulous Lower   Pulmonary shortness of breath and with exertion, neg sleep apnea, COPD COPD inhaler and oxygen dependent, neg recent URI, Current Smoker,    + wheezing      Cardiovascular hypertension, Pt. on medications and Pt. on home beta blockers + CAD, + Past MI, + CABG, + Peripheral Vascular Disease and +CHF Rhythm:Regular  EF 30%   Neuro/Psych  Headaches, PSYCHIATRIC DISORDERS Anxiety Depression    GI/Hepatic negative GI ROS, Neg liver ROS,   Endo/Other  negative endocrine ROS  Renal/GU negative Renal ROS     Musculoskeletal   Abdominal   Peds  Hematology negative hematology ROS (+)   Anesthesia Other Findings   Reproductive/Obstetrics                            Anesthesia Physical Anesthesia Plan  ASA: III  Anesthesia Plan: General   Post-op Pain Management:    Induction: Intravenous  Airway Management Planned: Oral ETT  Additional Equipment: None  Intra-op Plan:   Post-operative Plan: Extubation in OR  Informed Consent: I have reviewed the patients History and Physical, chart, labs and discussed the procedure including the risks, benefits and alternatives for the proposed anesthesia with the patient or authorized representative who has indicated his/her understanding and acceptance.     Plan Discussed with: CRNA and Surgeon  Anesthesia Plan Comments:         Anesthesia Quick Evaluation

## 2014-09-14 NOTE — Progress Notes (Signed)
UR COMPLETED  

## 2014-09-14 NOTE — Interval H&P Note (Signed)
[facility] [place]  History and Physical Update  The patient was interviewed and re-examined.  The patient's previous History and Physical has been reviewed and is unchanged from my consult.  There is no change in the plan of care: EVAR.  Adele Barthel, MD [facility] [place] Office: [phone] Pager: [phone]  09/14/2014, 8:04 AM

## 2014-09-14 NOTE — Care Management Note (Addendum)
Case Management Note  Patient Details  Name: Tommy Turner MRN: [id number] Date of Birth: [birth year]  Subjective/Objective:        Pt from home admitted with AAA, independent with activities of daily living.          Action/Plan: Return to home when medically stable. CM to f/u  With discharge needs.   Expected Discharge Date:                  Expected Discharge Plan:  Home/Self Care  In-House Referral:     Discharge planning Services  CM Consult  Post Acute Care Choice:    Choice offered to:     DME Arranged:    DME Agency:     HH Arranged:    HH Agency:     Status of Service:  In process, will continue to follow  Medicare Important Message Given:    Date Medicare IM Given:    Medicare IM give by:    Date Additional Medicare IM Given:    Additional Medicare Important Message give by:     If discussed at [place] of Stay Meetings, dates discussed:    Additional Comments:  Sharin Mons, RN 09/14/2014, 4:17 PM

## 2014-09-14 NOTE — Progress Notes (Signed)
  Day of Surgery Note    Subjective:  C/o pain when he points to his scrotal area-catheter got tugged on; wants coffee  Filed Vitals:   09/14/14 1400  BP:   Pulse:   Temp:   Resp: 26    Incisions:   Bilateral groins are soft without hematoma Extremities:  palpable right DP and biphasic doppler signals in the left DP/PT and right DP/PT; + edema BLE Cardiac:  regular Lungs:  Non labored Abdomen:  Soft, NT/ND  Assessment/Plan:  This is a 74 y.o. male who is s/p  1. Bilateral common femoral artery cannulation under ultrasound guidance 2. "Preclose" repair of bilateral common femoral artery  3. Placement of catheter in aorta x 2 4. Aortogram 5. Placement of bifurcated aortic endograft (35 mm x 14 mm x 14 cm) 6. Placement of left limb extension (20 mm x 9.5 cm) 7. Placement of left iliac limb (16 mm x 9.5 cm) 8. Radiologic S&I   -pt doing well this afternoon -pt's BP is soft, will hold Lotensin this afternoon -he does have a palpable right DP and biphasic doppler signals in the left DP/PT and right DP/PT -if pt continues to do well, possible discharge tomorrow   Leontine Locket, PA-C 09/14/2014 3:10 PM

## 2014-09-15 ENCOUNTER — Encounter (HOSPITAL_COMMUNITY): Payer: Self-pay | Admitting: Vascular Surgery

## 2014-09-15 LAB — CBC
HEMATOCRIT: 42.3 % (ref 39.0–52.0)
HEMOGLOBIN: 13.4 g/dL (ref 13.0–17.0)
MCH: 30.6 pg (ref 26.0–34.0)
MCHC: 31.7 g/dL (ref 30.0–36.0)
MCV: 96.6 fL (ref 78.0–100.0)
Platelets: 101 10*3/uL — ABNORMAL LOW (ref 150–400)
RBC: 4.38 MIL/uL (ref 4.22–5.81)
RDW: 14.3 % (ref 11.5–15.5)
WBC: 10.9 10*3/uL — ABNORMAL HIGH (ref 4.0–10.5)

## 2014-09-15 LAB — BASIC METABOLIC PANEL
ANION GAP: 7 (ref 5–15)
CALCIUM: 8.6 mg/dL — AB (ref 8.9–10.3)
CO2: 33 mmol/L — ABNORMAL HIGH (ref 22–32)
CREATININE: 0.46 mg/dL — AB (ref 0.61–1.24)
Chloride: 97 mmol/L — ABNORMAL LOW (ref 101–111)
GFR calc Af Amer: >60 mL/min (ref >60)
Glucose, Bld: 102 mg/dL — ABNORMAL HIGH (ref 70–99)
Potassium: 4.2 mmol/L (ref 3.5–5.1)
Sodium: 137 mmol/L (ref 135–145)

## 2014-09-15 MED ORDER — TAMSULOSIN HCL 0.4 MG PO CAPS: 0.4000 mg | ORAL_CAPSULE | Freq: Every day | ORAL | Status: DC

## 2014-09-15 NOTE — Progress Notes (Signed)
Pt d/c home per MD order, pt tol well, pt vss, pt d/c instructions given, all questions answered,

## 2014-09-15 NOTE — Care Management Note (Signed)
Case Management Note  Patient Details  Name: Tommy Turner MRN: [id number] Date of Birth: 1940/11/12  Subjective/Objective:                From home with family admitted with carotid stenosis.    Action/Plan:   Return to home when medically stable. CM to f/u with potential d/c needs. Expected Discharge Date:                  Expected Discharge Plan:  Home/Self Care  In-House Referral:     Discharge planning Services  CM Consult  Post Acute Care Choice:    Choice offered to:     DME Arranged:    DME Agency:     HH Arranged:    [place] Agency:     Status of Service:  Completed, signed off  Medicare Important Message Given:    Date Medicare IM Given:    Medicare IM give by:    Date Additional Medicare IM Given:    Additional Medicare Important Message give by:     If discussed at [place] of Stay Meetings, dates discussed:    Additional Comments:  Sharin Mons, RN 09/15/2014, 11:53 AM

## 2014-09-15 NOTE — Progress Notes (Addendum)
  Progress Note    09/15/2014 7:24 AM 1 Day Post-Op  Subjective:  C/o tenderness in both groins; c/o burning when he pees  Tm 99.5 now afebrile HR 70's-80's NSR 573'U-202'R systolic 42% [id number]  Gtts:  none  Filed Vitals:   09/15/14 0723  BP:   Pulse:   Temp: 98.2 F (36.8 C)  Resp:     Physical Exam: Cardiac:  regular Lungs:  Decreased BS left base, otherwise clear Incisions:  Bilateral groins are soft without hematoma Extremities:  Palpable right DP; biphasic doppler signals in the left PT and right DP/PT; edema improved this am Abdomen:  Soft, NT/ND  CBC    Component Value Date/Time   WBC 10.9* 09/15/2014 0300   RBC 4.38 09/15/2014 0300   HGB 13.4 09/15/2014 0300   HGB 16.7 03/12/2013 0946   HCT 42.3 09/15/2014 0300   PLT 101* 09/15/2014 0300   MCV 96.6 09/15/2014 0300   MCH 30.6 09/15/2014 0300   MCHC 31.7 09/15/2014 0300   RDW 14.3 09/15/2014 0300   LYMPHSABS 1.2 11/12/2013 0855   MONOABS 0.7 11/12/2013 0855   EOSABS 0.1 11/12/2013 0855   BASOSABS 0.0 11/12/2013 0855    BMET    Component Value Date/Time   NA 137 09/15/2014 0300   K 4.2 09/15/2014 0300   CL 97* 09/15/2014 0300   CO2 33* 09/15/2014 0300   GLUCOSE 102* 09/15/2014 0300   BUN <5* 09/15/2014 0300   CREATININE 0.46* 09/15/2014 0300   CREATININE 0.51 08/12/2014 1602   CALCIUM 8.6* 09/15/2014 0300   GFRNONAA >60 09/15/2014 0300   GFRAA >60 09/15/2014 0300    INR    Component Value Date/Time   INR 1.17 09/14/2014 1420     Intake/Output Summary (Last 24 hours) at 09/15/14 0724 Last data filed at 09/15/14 0630  Gross per 24 hour  Intake   2310 ml  Output   4035 ml  Net  -1725 ml     Assessment:  74 y.o. male is s/p:  1. Bilateral common femoral artery cannulation under ultrasound guidance 2. "Preclose" repair of bilateral common femoral artery  3. Placement of catheter in aorta x 2 4. Aortogram 5. Placement of bifurcated aortic endograft (35 mm x 14 mm x 14  cm) 6. Placement of left limb extension (20 mm x 9.5 cm) 7. Placement of left iliac limb (16 mm x 9.5 cm) 8. Radiologic S&I   1 Day Post-Op  Plan: -pt doing well postoperatively; good doppler signals in bilateral feet -His foley got pulled on yesterday afternoon-he has voided this morning and per the pt, there was a small amount of blood in the urinal.  He did have some burning with voiding this am.  Most likely due to trauma from catheter as pre op u/a was negative. -pt is on home O2 -creatinine stable this am (received 86cc of contrast intraoperatively) -DVT prophylaxis:  Lovenox to start today -pt has ambulated and pain is well controlled. -home later this am and f/u with Dr. Bridgett Larsson in 4 weeks with CTA protocol.   Leontine Locket, PA-C [facility] [phone] 09/15/2014 7:24 AM    Addendum  I have independently interviewed and examined the patient, and I agree with the physician assistant's findings.  Ok to DC once ambulating.  Pt baseline on home oxygen reportedly.  Expect pt's SaO2 to be <90% baseline.  Adele Barthel, MD [facility] [place] Office: [phone] Pager: [phone]  09/15/2014, 7:52 AM

## 2014-09-15 NOTE — Discharge Summary (Addendum)
EVAR Discharge Summary   Tommy Turner 1940-07-27 74 y.o. male  MRN: [id number]  Admission Date: 09/14/2014  Discharge Date: 09/15/14  Physician: Conrad [place], MD  Admission Diagnosis: Abdominal aortic aneurysm I71.4   HPI:   This is a 74 y.o. male who presents with chief complaint: follow up for AAA. Prior request for EVAR was blocked by [facility] as reported the AAA was only 4.5 cm. I did not agree with the read, unfortunately the raw data from the CT had been dumped so 3D reconstruction could not be done with centerline measurements. The patient returns 6 month later with repeat CTA abd/pelvis with 3D reconstruction. The patient has chronic back pain. The patient is a smoker. Pt continues to have anxiety with risk of rupture of his AAA which causes him to lose sleep.  Hospital Course:  The patient was admitted to the hospital and taken to the operating room on 09/14/2014 and underwent: 1. Bilateral common femoral artery cannulation under ultrasound guidance 2. "Preclose" repair of bilateral common femoral artery  3. Placement of catheter in aorta x 2 4. Aortogram 5. Placement of bifurcated aortic endograft (35 mm x 14 mm x 14 cm) 6. Placement of left limb extension (20 mm x 9.5 cm) 7. Placement of left iliac limb (16 mm x 9.5 cm) 8. Radiologic S&I  The pt tolerated the procedure well and was transported to the PACU in good condition.   By that afternoon the pt was doing well.  His only complaint was that his catheter had been pulled on and it was painful.  He had biphasic doppler signals in the left DP/PT and right DP/PT the afternoon of surgery and POD 1.    Also on POD 1, he was able to void, however, he did have burning with this, which is most likely due to trauma from the catheter as his pre operative u/a was negative.  Pt was unable to void prior to discharge.  A bladder scan was performed and he had 600cc residual.  A foley catheter was placed and he was prescribed  Flomax.  We will set him up to see [facility] in 1 week.  He is still requiring O2NC, however, he is on home O2.   The remainder of the hospital course consisted of increasing mobilization and increasing intake of solids without difficulty.  CBC    Component Value Date/Time   WBC 10.9* 09/15/2014 0300   RBC 4.38 09/15/2014 0300   HGB 13.4 09/15/2014 0300   HGB 16.7 03/12/2013 0946   HCT 42.3 09/15/2014 0300   PLT 101* 09/15/2014 0300   MCV 96.6 09/15/2014 0300   MCH 30.6 09/15/2014 0300   MCHC 31.7 09/15/2014 0300   RDW 14.3 09/15/2014 0300   LYMPHSABS 1.2 11/12/2013 0855   MONOABS 0.7 11/12/2013 0855   EOSABS 0.1 11/12/2013 0855   BASOSABS 0.0 11/12/2013 0855    BMET    Component Value Date/Time   NA 137 09/15/2014 0300   K 4.2 09/15/2014 0300   CL 97* 09/15/2014 0300   CO2 33* 09/15/2014 0300   GLUCOSE 102* 09/15/2014 0300   BUN <5* 09/15/2014 0300   CREATININE 0.46* 09/15/2014 0300   CREATININE 0.51 08/12/2014 1602   CALCIUM 8.6* 09/15/2014 0300   GFRNONAA >60 09/15/2014 0300   GFRAA >60 09/15/2014 0300           Discharge Instructions    ABDOMINAL PROCEDURE/ANEURYSM REPAIR/AORTO-BIFEMORAL BYPASS:  Call MD for increased abdominal pain; cramping diarrhea; nausea/vomiting  Complete by:  As directed      Call MD for:  redness, tenderness, or signs of infection (pain, swelling, bleeding, redness, odor or green/yellow discharge around incision site)    Complete by:  As directed      Call MD for:  severe or increased pain, loss or decreased feeling  in affected limb(s)    Complete by:  As directed      Call MD for:  temperature >100.5    Complete by:  As directed      Discharge wound care:    Complete by:  As directed   Shower daily with soap and water starting 09/16/14     Driving Restrictions    Complete by:  As directed   No driving for 2 weeks     Lifting restrictions    Complete by:  As directed   No lifting for 4 weeks     Resume previous diet     Complete by:  As directed            Discharge Diagnosis:  Abdominal aortic aneurysm I71.4  Secondary Diagnosis: Patient Active Problem List   Diagnosis Date Noted  . Abdominal aortic aneurysm without rupture 09/14/2014  . Dyspnea 06/03/2014  . Elevated PSA 03/09/2014  . Peripheral vascular disease, unspecified 01/22/2014  . Chronic pain syndrome 10/27/2013  . Hyperglycemia 04/09/2013  . Acute respiratory failure with hypoxia 04/09/2013  . COPD with acute exacerbation 04/05/2013  . COPD exacerbation 04/05/2013  . Loss of weight 03/18/2013  . Early satiety 03/18/2013  . Dysphagia, unspecified(787.20) 03/18/2013  . COPD (chronic obstructive pulmonary disease) 10/31/2012  . Hyperlipemia 10/31/2012  . CAD (coronary artery disease) 10/31/2012  . Anxiety 10/31/2012  . Abdominal aortic aneurysm 02/02/2011  . TOBACCO ABUSE 09/20/2009  . PEPTIC ULCER DISEASE 09/20/2009  . Essential hypertension 09/19/2009  . BRONCHITIS, CHRONIC 09/19/2009   Past Medical History  Diagnosis Date  . AAA (abdominal aortic aneurysm)   . Hyperlipidemia     takes Atorvastatin daily  . CAD (coronary artery disease)   . Ulcer     gastric  . Aneurysm   . Anxiety     takes Xanax daily as needed  . Depression     takes Celexa daily  . Hypertension     takes Metoprolol and Benazepril  daily  . Peripheral edema     takes Furosemide daily  . Myocardial infarction     20+yrs ago  . COPD (chronic obstructive pulmonary disease)     Albuterol inhaler and neb daily as needed;takes SPiriva daily  . History of shingles   . CHF (congestive heart failure)   . Cancer   . Shortness of breath      on oxygen  2 liters continuous  . Headache(784.0)     occasionally       Medication List    TAKE these medications        ALPRAZolam 1 MG tablet  Commonly known as:  XANAX  Take 0.5-1 mg by mouth 3 (three) times daily as needed for anxiety (Takes one-half tablet in the morning and afternoon. takes one  tablet at bedtime daily).     aspirin 81 MG tablet  Take 81 mg by mouth daily.     atorvastatin 80 MG tablet  Commonly known as:  LIPITOR  Take 1 tablet (80 mg total) by mouth daily.     benazepril 40 MG tablet  Commonly known as:  LOTENSIN  Take 1 tablet (40  mg total) by mouth daily.     budesonide-formoterol 160-4.5 MCG/ACT inhaler  Commonly known as:  SYMBICORT  Inhale 2 puffs into the lungs 2 (two) times daily.     citalopram 20 MG tablet  Commonly known as:  CELEXA  TAKE 1/2 TABLET BY MOUTH AT BEDTIME FOR 6 DAYS, THEN 1 TABLET AT BEDTIME     citalopram 20 MG tablet  Commonly known as:  CELEXA  Take 20 mg by mouth at bedtime.     doxycycline 100 MG capsule  Commonly known as:  VIBRAMYCIN  Take 1 capsule (100 mg total) by mouth 2 (two) times daily.     furosemide 40 MG tablet  Commonly known as:  LASIX  Take 1 tablet (40 mg total) by mouth 2 (two) times daily.     HYDROcodone-acetaminophen 10-325 MG per tablet  Commonly known as:  NORCO  Take 1 tablet by mouth 2 (two) times daily as needed (pain).     metoprolol succinate 25 MG 24 hr tablet  Commonly known as:  TOPROL-XL  Take 0.5 tablets (12.5 mg total) by mouth 2 (two) times daily.     nitroGLYCERIN 0.4 MG SL tablet  Commonly known as:  NITROSTAT  Place 1 tablet (0.4 mg total) under the tongue every 5 (five) minutes as needed for chest pain.     oxyCODONE 5 MG immediate release tablet  Commonly known as:  ROXICODONE  Take 1 tablet (5 mg total) by mouth every 6 (six) hours as needed.     potassium chloride SA 20 MEQ tablet  Commonly known as:  K-DUR,KLOR-CON  Take 1 tablet (20 mEq total) by mouth daily.     tamsulosin 0.4 MG Caps capsule  Commonly known as:  FLOMAX  Take 1 capsule (0.4 mg total) by mouth daily.     tiotropium 18 MCG inhalation capsule  Commonly known as:  SPIRIVA  Place 1 capsule (18 mcg total) into inhaler and inhale daily.     UNABLE TO FIND  Place 2 L into the nose continuous.  Oxygen 2 liters.     VENTOLIN HFA 108 (90 BASE) MCG/ACT inhaler  Generic drug:  albuterol  INHALE 2 PUFFS BY MOUTH EVERY 4-6 HOURS AS NEEDED     albuterol (2.5 MG/3ML) 0.083% nebulizer solution  Commonly known as:  PROVENTIL  USE 1 VIAL VIA NEBULIZER EVERY 4 HOURS AS NEEDED        Prescriptions given: 1.  Roxicodone #20 No Refill 2.  Flomax 0.4mg  daily #30 2RF  Disposition: Home  Patient's condition: is Good  Follow up: 1. Dr. Bridgett Larsson  in 4 weeks with CTA   Leontine Locket, PA-C [facility] [phone] 09/15/2014  12:28 PM  Addendum  I have independently interviewed and examined the patient, and I agree with the physician assistant's discharge summary.  Adele Barthel, MD [facility] [place] Office: [phone] Pager: [phone]  09/15/2014, 12:28 PM    - For VQI Registry use --- Instructions: Press F2 to tab through selections.  Delete question if not applicable.   Post-op:  Time to Extubation: [x]  In OR, [ ]  < 12 hrs, [ ]  12-24 hrs, [ ]  >=24 hrs Vasopressors Req. Post-op: No MI: No., [ ]  Troponin only, [ ]  EKG or Clinical New Arrhythmia: No CHF: No ICU Stay: 1 day in stepdown Transfusion: No  If yes, n/a units given  Complications: Resp failure: No., [ ]  Pneumonia, [ ]  Ventilator Chg in renal function: No., [ ]   Inc. Cr > 0.5, [ ]  Temp. Dialysis, [ ]  Permanent dialysis Leg ischemia: No., no Surgery needed, [ ]  Yes, Surgery needed, [ ]  Amputation Bowel ischemia: No., [ ]  Medical Rx, [ ]  Surgical Rx Wound complication: No., [ ]  Superficial separation/infection, [ ]  Return to OR Return to OR: No  Return to OR for bleeding: No Stroke: No., [ ]  Minor, [ ]  Major  Discharge medications: Statin use:  Yes If No: [ ]  For Medical reasons, [ ]  Non-compliant, [ ]  Not-indicated ASA use:  Yes  If No: [ ]  For Medical reasons, [ ]  Non-compliant, [ ]  Not-indicated Plavix use:  No If No: [ ]  For Medical reasons, [ ]  Non-compliant,  [ ]  Not-indicated Beta blocker use:  Yes If No: [ ]  For Medical reasons, [ ]  Non-compliant, [ ]  Not-indicated

## 2014-09-17 ENCOUNTER — Telehealth: Payer: Self-pay | Admitting: Vascular Surgery

## 2014-09-17 NOTE — Telephone Encounter (Signed)
-----   Message from Denman George, RN sent at 09/14/2014  5:29 PM EDT ----- Regarding: needs 4 wk f/u with [facility] with CTA Abd/ Pelvis   ----- Message -----    From: Gabriel Earing, PA-C    Sent: 09/14/2014   3:15 PM      To: [facility] Charge Pool  S/p EVAR 09/14/14.  F/u with Dr. Bridgett Larsson in 4 weeks with CTA protocol.  Thanks, Aldona Bar

## 2014-09-17 NOTE — Telephone Encounter (Signed)
Left vm for pt with appt information, mailed letter and sent to [facility] for prior authorization, dpm

## 2014-09-20 ENCOUNTER — Other Ambulatory Visit: Payer: Self-pay | Admitting: Family Medicine

## 2014-09-28 DIAGNOSIS — R972 Elevated prostate specific antigen [PSA]: Secondary | ICD-10-CM | POA: Diagnosis not present

## 2014-09-28 DIAGNOSIS — R339 Retention of urine, unspecified: Secondary | ICD-10-CM | POA: Diagnosis not present

## 2014-09-28 DIAGNOSIS — N139 Obstructive and reflux uropathy, unspecified: Secondary | ICD-10-CM | POA: Diagnosis not present

## 2014-10-01 ENCOUNTER — Encounter: Payer: Self-pay | Admitting: Vascular Surgery

## 2014-10-06 ENCOUNTER — Encounter: Payer: 59 | Admitting: Vascular Surgery

## 2014-10-06 ENCOUNTER — Other Ambulatory Visit: Payer: 59

## 2014-10-07 ENCOUNTER — Telehealth: Payer: Self-pay | Admitting: Vascular Surgery

## 2014-10-07 NOTE — Telephone Encounter (Signed)
Spoke with wife, CTA 10/14/14 at 1:40 pm - [facility]. NP appt 10/14/14 3:30 pm. She verbalized understanding.

## 2014-10-08 ENCOUNTER — Ambulatory Visit (INDEPENDENT_AMBULATORY_CARE_PROVIDER_SITE_OTHER): Payer: 59 | Admitting: Urology

## 2014-10-08 DIAGNOSIS — R972 Elevated prostate specific antigen [PSA]: Secondary | ICD-10-CM | POA: Diagnosis not present

## 2014-10-08 DIAGNOSIS — R339 Retention of urine, unspecified: Secondary | ICD-10-CM | POA: Diagnosis not present

## 2014-10-11 ENCOUNTER — Encounter: Payer: Self-pay | Admitting: Family

## 2014-10-14 ENCOUNTER — Other Ambulatory Visit: Payer: 59

## 2014-10-14 ENCOUNTER — Ambulatory Visit: Payer: 59 | Admitting: Family

## 2014-10-18 ENCOUNTER — Encounter: Payer: Self-pay | Admitting: Family

## 2014-10-20 ENCOUNTER — Encounter: Payer: Self-pay | Admitting: Family

## 2014-10-21 ENCOUNTER — Ambulatory Visit (INDEPENDENT_AMBULATORY_CARE_PROVIDER_SITE_OTHER): Payer: Self-pay | Admitting: Family

## 2014-10-21 ENCOUNTER — Ambulatory Visit
Admission: RE | Admit: 2014-10-21 | Discharge: 2014-10-21 | Disposition: A | Discharge status: Home / Self Care | Payer: 59 | Source: Ambulatory Visit | Attending: Vascular Surgery | Admitting: Vascular Surgery

## 2014-10-21 ENCOUNTER — Encounter: Payer: Self-pay | Admitting: Family

## 2014-10-21 VITALS — BP 96/51 | HR 71 | Temp 98.1°F | Resp 16 | Ht 67.0 in | Wt 154.0 lb

## 2014-10-21 DIAGNOSIS — Z95828 Presence of other vascular implants and grafts: Secondary | ICD-10-CM

## 2014-10-21 DIAGNOSIS — F17200 Nicotine dependence, unspecified, uncomplicated: Secondary | ICD-10-CM

## 2014-10-21 DIAGNOSIS — I714 Abdominal aortic aneurysm, without rupture, unspecified: Secondary | ICD-10-CM

## 2014-10-21 DIAGNOSIS — N200 Calculus of kidney: Secondary | ICD-10-CM | POA: Diagnosis not present

## 2014-10-21 DIAGNOSIS — Z720 Tobacco use: Secondary | ICD-10-CM

## 2014-10-21 DIAGNOSIS — M7989 Other specified soft tissue disorders: Secondary | ICD-10-CM | POA: Insufficient documentation

## 2014-10-21 DIAGNOSIS — R600 Localized edema: Secondary | ICD-10-CM

## 2014-10-21 DIAGNOSIS — Z48812 Encounter for surgical aftercare following surgery on the circulatory system: Secondary | ICD-10-CM

## 2014-10-21 MED ORDER — IOPAMIDOL (ISOVUE-370) INJECTION 76%
75.0000 mL | Freq: Once | INTRAVENOUS | Status: AC | PRN
  Administered 2014-10-21: 75 mL via INTRAVENOUS

## 2014-10-21 NOTE — Patient Instructions (Addendum)
Abdominal Aortic Aneurysm Endograft Repair Abdominal aortic aneurysm endograft repair is a procedure to fix an abdominal aortic aneurysm. An aneurysm is a weakened or damaged part of an artery wall that bulges out from the normal force of blood pumping through the body. An abdominal aortic aneurysm is an aneurysm that occurs in the lower part of the aorta, the main artery of the body.  The repair procedure is often done if the aneurysm gets so large that it might burst (rupture). A ruptured aneurysm would cause bleeding inside the body that is life threatening. Before that happens, this procedure is needed to fix the condition. The procedure may also be done if the aneurysm causes symptoms such as pain in the back, abdomen, or side. In this procedure, a tube made up of fabric supported by a metal mesh (endograft or stent-graft) is placed in the weakened part of the aorta to repair the area. This procedure may take 1-3 hours.  LET YOUR HEALTH CARE PROVIDER KNOW ABOUT:  Any allergies you have.   All medicines you are taking, including vitamins, herbs, eye drops, creams, and over-the-counter medicines.  Anysteroids you are using (by mouth or as creams).   Previous problems you or members of your family have had with the use of anesthetics.   Any blood disorders or history of blood clots.   Previous surgeries you have had.   Other health problems you have.  Possibility of pregnancy, if this applies.  RISKS AND COMPLICATIONS Generally, endograft repair of an abdominal aortic aneurysm is a safe procedure. However, as with any surgical procedure, complications can occur. Possible complications include:  Leaking of blood around the endograft.  Infection.  Damage to surrounding nerves, tissues, or structures.  Displacement of the endograft away from the proper location.   Blockage of blood flow through the graft.  Blood clots.   Kidney problems.  Blockage of blood flow to the  legs (rare).  Rupture of the aorta even after successful endograft repair (rare).  BEFORE THE PROCEDURE   You may need to have blood tests, a test to check heart rhythm (electrocardiography), or a test to check blood flow (angiography) done before the day of the procedure.  Imaging tests will be done to help determine the size and location of the aneurysm. This could include ultrasonography, a CT scan, or an MRI.  Ask your health care provider about changing or stopping your regular medicines.  Do not eat or drink anything for at least 8 hours before the procedure. Ask your health care provider if it is okay to take any needed medicines with a sip of water.  Do not smoke for as long as possible before the surgery.  Make plans to have someone drive you home after your hospital stay. PROCEDURE   You will be given medicine to help you relax (sedative). You may also be given medicine to make you sleep through the procedure (general anesthetic) or medicine to numb the affected area of the body (local orregional anesthetic). Medicine may be given through an intravenous (IV) access tube that is put into one of your veins.  The groin area will be washed and shaved.   Small cuts (incisions) are usually made on both sides of the groin. Long, thin tubes (catheters) are passed through these incisions, inserted into the artery in your thigh, and moved up into the aneurysm in the aorta.   The health care provider uses live X-ray pictures to guide the endograft through the catheterto   the site of the aneurysm.  The endograft is released to seal off the aneurysm and line the aorta. It prevents blood from flowing into the aneurysm and helps prevent it from rupturing. The placement of the endograft is permanent.  X-rays are used to check the position of the endograft and confirm proper placement.  The catheters are taken out, and the incisions are closed with stitches. AFTER THE PROCEDURE    You will need to lie flat for several hours. Bending the leg that has the insertion site can cause it to bleed and swell.  You will then be encouraged to move around several times a day and to gradually increase activity.   Your blood pressure and pulse (vital signs) will be checked often.  You will receive medicines to control pain.  Certain tests may be done to check the function and location of the endograft after your procedure.  You will need to stay in the hospital for about 1-4 days.  Document Released: 09/09/2008 Document Revised: 12/24/2012 Document Reviewed: 09/12/2012 [facility] Patient Information 2015 [place], [place]. This information is not intended to replace advice given to you by your health care provider. Make sure you discuss any questions you have with your health care provider.   Smoking Cessation Quitting smoking is important to your health and has many advantages. However, it is not always easy to quit since nicotine is a very addictive drug. Oftentimes, people try 3 times or more before being able to quit. This document explains the best ways for you to prepare to quit smoking. Quitting takes hard work and a lot of effort, but you can do it. ADVANTAGES OF QUITTING SMOKING  You will live longer, feel better, and live better.  Your body will feel the impact of quitting smoking almost immediately.  Within 20 minutes, blood pressure decreases. Your pulse returns to its normal level.  After 8 hours, carbon monoxide levels in the blood return to normal. Your oxygen level increases.  After 24 hours, the chance of having a heart attack starts to decrease. Your breath, hair, and body stop smelling like smoke.  After 48 hours, damaged nerve endings begin to recover. Your sense of taste and smell improve.  After 72 hours, the body is virtually free of nicotine. Your bronchial tubes relax and breathing becomes easier.  After 2 to 12 weeks, lungs can hold more air.  Exercise becomes easier and circulation improves.  The risk of having a heart attack, stroke, cancer, or lung disease is greatly reduced.  After 1 year, the risk of coronary heart disease is cut in half.  After 5 years, the risk of stroke falls to the same as a nonsmoker.  After 10 years, the risk of lung cancer is cut in half and the risk of other cancers decreases significantly.  After 15 years, the risk of coronary heart disease drops, usually to the level of a nonsmoker.  If you are pregnant, quitting smoking will improve your chances of having a healthy baby.  The people you live with, especially any children, will be healthier.  You will have extra money to spend on things other than cigarettes. QUESTIONS TO THINK ABOUT BEFORE ATTEMPTING TO QUIT You may want to talk about your answers with your health care provider.  Why do you want to quit?  If you tried to quit in the past, what helped and what did not?  What will be the most difficult situations for you after you quit? How will you  plan to handle them?  Who can help you through the tough times? Your family? Friends? A health care provider?  What pleasures do you get from smoking? What ways can you still get pleasure if you quit? Here are some questions to ask your health care provider:  How can you help me to be successful at quitting?  What medicine do you think would be best for me and how should I take it?  What should I do if I need more help?  What is smoking withdrawal like? How can I get information on withdrawal? GET READY  Set a quit date.  Change your environment by getting rid of all cigarettes, ashtrays, matches, and lighters in your home, car, or work. Do not let people smoke in your home.  Review your past attempts to quit. Think about what worked and what did not. GET SUPPORT AND ENCOURAGEMENT You have a better chance of being successful if you have help. You can get support in many ways.  Tell  your family, friends, and coworkers that you are going to quit and need their support. Ask them not to smoke around you.  Get individual, group, or telephone counseling and support. Programs are available at [employer] and health centers. Call your local health department for information about programs in your area.  Spiritual beliefs and practices may help some smokers quit.  Download a "quit meter" on your computer to keep track of quit statistics, such as how long you have gone without smoking, cigarettes not smoked, and money saved.  Get a self-help book about quitting smoking and staying off tobacco. [place] yourself from urges to smoke. Talk to someone, go for a walk, or occupy your time with a task.  Change your normal routine. Take a different route to work. Drink tea instead of coffee. Eat breakfast in a different place.  Reduce your stress. Take a hot bath, exercise, or read a book.  Plan something enjoyable to do every day. Reward yourself for not smoking.  Explore interactive web-based programs that specialize in helping you quit. GET MEDICINE AND USE IT CORRECTLY Medicines can help you stop smoking and decrease the urge to smoke. Combining medicine with the above behavioral methods and support can greatly increase your chances of successfully quitting smoking.  Nicotine replacement therapy helps deliver nicotine to your body without the negative effects and risks of smoking. Nicotine replacement therapy includes nicotine gum, lozenges, inhalers, nasal sprays, and skin patches. Some may be available over-the-counter and others require a prescription.  Antidepressant medicine helps people abstain from smoking, but how this works is unknown. This medicine is available by prescription.  Nicotinic receptor partial agonist medicine simulates the effect of nicotine in your brain. This medicine is available by prescription. Ask your health care  provider for advice about which medicines to use and how to use them based on your health history. Your health care provider will tell you what side effects to look out for if you choose to be on a medicine or therapy. Carefully read the information on the package. Do not use any other product containing nicotine while using a nicotine replacement product.  RELAPSE OR DIFFICULT SITUATIONS Most relapses occur within the first 3 months after quitting. Do not be discouraged if you start smoking again. Remember, most people try several times before finally quitting. You may have symptoms of withdrawal because your body is used to nicotine. You may crave cigarettes, be irritable,  feel very hungry, cough often, get headaches, or have difficulty concentrating. The withdrawal symptoms are only temporary. They are strongest when you first quit, but they will go away within 10-14 days. To reduce the chances of relapse, try to:  Avoid drinking alcohol. Drinking lowers your chances of successfully quitting.  Reduce the amount of caffeine you consume. Once you quit smoking, the amount of caffeine in your body increases and can give you symptoms, such as a rapid heartbeat, sweating, and anxiety.  Avoid smokers because they can make you want to smoke.  Do not let weight gain distract you. Many smokers will gain weight when they quit, usually less than 10 pounds. Eat a healthy diet and stay active. You can always lose the weight gained after you quit.  Find ways to improve your mood other than smoking. FOR MORE INFORMATION  www.smokefree.gov  Document Released: 04/17/2001 Document Revised: 09/07/2013 Document Reviewed: 08/02/2011 [facility] Patient Information 2015 [place], [place]. This information is not intended to replace advice given to you by your health care provider. Make sure you discuss any questions you have with your health care provider.    Smoking Cessation, Tips for Success If you are ready to quit  smoking, congratulations! You have chosen to help yourself be healthier. Cigarettes bring nicotine, tar, carbon monoxide, and other irritants into your body. Your lungs, heart, and blood vessels will be able to work better without these poisons. There are many different ways to quit smoking. Nicotine gum, nicotine patches, a nicotine inhaler, or nicotine nasal spray can help with physical craving. Hypnosis, support groups, and medicines help break the habit of smoking. WHAT THINGS CAN I DO TO MAKE QUITTING EASIER?  Here are some tips to help you quit for good:  Pick a date when you will quit smoking completely. Tell all of your friends and family about your plan to quit on that date.  Do not try to slowly cut down on the number of cigarettes you are smoking. Pick a quit date and quit smoking completely starting on that day.  Throw away all cigarettes.   Clean and remove all ashtrays from your home, work, and car.  On a card, write down your reasons for quitting. Carry the card with you and read it when you get the urge to smoke.  Cleanse your body of nicotine. Drink enough water and fluids to keep your urine clear or pale yellow. Do this after quitting to flush the nicotine from your body.  Learn to predict your moods. Do not let a bad situation be your excuse to have a cigarette. Some situations in your life might tempt you into wanting a cigarette.  Never have "just one" cigarette. It leads to wanting another and another. Remind yourself of your decision to quit.  Change habits associated with smoking. If you smoked while driving or when feeling stressed, try other activities to replace smoking. Stand up when drinking your coffee. Brush your teeth after eating. Sit in a different chair when you read the paper. Avoid alcohol while trying to quit, and try to drink fewer caffeinated beverages. Alcohol and caffeine may urge you to smoke.  Avoid foods and drinks that can trigger a desire to  smoke, such as sugary or spicy foods and alcohol.  Ask people who smoke not to smoke around you.  Have something planned to do right after eating or having a cup of coffee. For example, plan to take a walk or exercise.  Try a relaxation exercise  to calm you down and decrease your stress. Remember, you may be tense and nervous for the first 2 weeks after you quit, but this will pass.  Find new activities to keep your hands busy. Play with a pen, coin, or rubber band. Doodle or draw things on paper.  Brush your teeth right after eating. This will help cut down on the craving for the taste of tobacco after meals. You can also try mouthwash.   Use oral substitutes in place of cigarettes. Try using lemon drops, carrots, cinnamon sticks, or chewing gum. Keep them handy so they are available when you have the urge to smoke.  When you have the urge to smoke, try deep breathing.  Designate your home as a nonsmoking area.  If you are a heavy smoker, ask your health care provider about a prescription for nicotine chewing gum. It can ease your withdrawal from nicotine.  Reward yourself. Set aside the cigarette money you save and buy yourself something nice.  Look for support from others. Join a support group or smoking cessation program. Ask someone at home or at work to help you with your plan to quit smoking.  Always ask yourself, "Do I need this cigarette or is this just a reflex?" Tell yourself, "Today, I choose not to smoke," or "I do not want to smoke." You are reminding yourself of your decision to quit.  Do not replace cigarette smoking with electronic cigarettes (commonly called e-cigarettes). The safety of e-cigarettes is unknown, and some may contain harmful chemicals.  If you relapse, do not give up! Plan ahead and think about what you will do the next time you get the urge to smoke. HOW WILL I FEEL WHEN I QUIT SMOKING? You may have symptoms of withdrawal because your body is used to  nicotine (the addictive substance in cigarettes). You may crave cigarettes, be irritable, feel very hungry, cough often, get headaches, or have difficulty concentrating. The withdrawal symptoms are only temporary. They are strongest when you first quit but will go away within 10-14 days. When withdrawal symptoms occur, stay in control. Think about your reasons for quitting. Remind yourself that these are signs that your body is healing and getting used to being without cigarettes. Remember that withdrawal symptoms are easier to treat than the major diseases that smoking can cause.  Even after the withdrawal is over, expect periodic urges to smoke. However, these cravings are generally short lived and will go away whether you smoke or not. Do not smoke! WHAT RESOURCES ARE AVAILABLE TO HELP ME QUIT SMOKING? Your health care provider can direct you to community resources or hospitals for support, which may include:  Group support.  Education.  Hypnosis.  Therapy. Document Released: 01/20/2004 Document Revised: 09/07/2013 Document Reviewed: 10/09/2012 [facility] Patient Information 2015 [place], [place]. This information is not intended to replace advice given to you by your health care provider. Make sure you discuss any questions you have with your health care provider.

## 2014-10-21 NOTE — Addendum Note (Signed)
Addended by: Dorthula Rue L on: 10/21/2014 02:52 PM   Modules accepted: Orders

## 2014-10-21 NOTE — Progress Notes (Signed)
[facility] [place]  Established EVAR  History of Present Illness  Tommy Turner is a 74 y.o. ([birth year]) male patient of Dr. Bridgett Larsson who presents for routine follow up s/p EVAR (Date: 09/14/2014) for a 5 cm AAA.   Most recent CTA (Date: 10/21/2014) demonstrates: no endoleak and unchanged sac size.  The patient denies back or abdominal pain. He also has a stable L CIA aneurysm, stable B popliteal ectasias to this point.  The patient denies claudication in legs with walking but he is not walking much.  He had a couple of recent falls recently, wife states due to walking in the heat without his supplemental oxygen, EMS checked his SaO2 per wife and it was low before his supplemental O2 was resumed.   The patient denies history of stroke or TIA symptoms.  He takes daily ASA and pravastatin.    Pt Diabetic: No Pt smoker: smoker  (1.5 cigarettes, started smoking at age 38 yrs). Pt and wife state he does not smoke around his supplemental oxygen.    Past Medical History  Diagnosis Date  . AAA (abdominal aortic aneurysm)   . Hyperlipidemia     takes Atorvastatin daily  . CAD (coronary artery disease)   . Ulcer     gastric  . Aneurysm   . Anxiety     takes Xanax daily as needed  . Depression     takes Celexa daily  . Hypertension     takes Metoprolol and Benazepril  daily  . Peripheral edema     takes Furosemide daily  . Myocardial infarction     20+yrs ago  . COPD (chronic obstructive pulmonary disease)     Albuterol inhaler and neb daily as needed;takes SPiriva daily  . History of shingles   . CHF (congestive heart failure)   . Cancer   . Shortness of breath      on oxygen  2 liters continuous  . Headache(784.0)     occasionally   Past Surgical History  Procedure Laterality Date  . Lipoma excision      right lower back  . Partial gastrectomy      approx 1990  . Hernia repair Left     inguinal  . Tonsillectomy      as a child  .  Esophagogastroduodenoscopy    . Coronary artery bypass graft  20+yrs ago    x 6  . Abdominal aortic endovascular stent graft N/A 09/14/2014    Procedure: ABDOMINAL AORTIC ENDOVASCULAR STENT GRAFT;  Surgeon: Conrad [place], MD;  Location: [facility] OR;  Service: Vascular;  Laterality: N/A;   Social History History  Substance Use Topics  . Smoking status: Light Tobacco Smoker -- 0.25 packs/day for 58 years    Types: Cigarettes  . Smokeless tobacco: Former Systems developer     Comment: vapor ciggs  . Alcohol Use: No   Family History Family History  Problem Relation Age of Onset  . Hypertension Brother   . Heart disease Brother     Heart Disease before age 59  . Heart attack Brother   . Cancer Brother   . Cancer Mother   . Diabetes Mother   . Heart disease Mother     Heart Disease before age 45  . Hypertension Mother   . Heart attack Mother   . Cancer Father     prostate  . Hypertension Father   . COPD Sister   . Diabetes Sister   . Heart disease Sister   .  Hypertension Sister   . Heart attack Sister   . Colon cancer Neg Hx    Current Outpatient Prescriptions on File Prior to Visit  Medication Sig Dispense Refill  . albuterol (PROVENTIL) (2.5 MG/3ML) 0.083% nebulizer solution USE 1 VIAL VIA NEBULIZER EVERY 4 HOURS AS NEEDED 150 mL 5  . ALPRAZolam (XANAX) 1 MG tablet Take 0.5-1 mg by mouth 3 (three) times daily as needed for anxiety (Takes one-half tablet in the morning and afternoon. takes one tablet at bedtime daily).    Marland Kitchen aspirin 81 MG tablet Take 81 mg by mouth daily.    Marland Kitchen atorvastatin (LIPITOR) 80 MG tablet Take 1 tablet (80 mg total) by mouth daily. 30 tablet 6  . benazepril (LOTENSIN) 40 MG tablet Take 1 tablet (40 mg total) by mouth daily. 90 tablet 3  . budesonide-formoterol (SYMBICORT) 160-4.5 MCG/ACT inhaler Inhale 2 puffs into the lungs 2 (two) times daily. 1 Inhaler 0  . citalopram (CELEXA) 20 MG tablet Take 20 mg by mouth at bedtime.    . citalopram (CELEXA) 20 MG tablet TAKE 1/2  TABLET BY MOUTH AT BEDTIME FOR 6 DAYS, THEN 1 TABLET AT BEDTIME 30 tablet 0  . doxycycline (VIBRAMYCIN) 100 MG capsule Take 1 capsule (100 mg total) by mouth 2 (two) times daily. 20 capsule 0  . furosemide (LASIX) 40 MG tablet Take 1 tablet (40 mg total) by mouth 2 (two) times daily. 60 tablet 6  . HYDROcodone-acetaminophen (NORCO) 10-325 MG per tablet Take 1 tablet by mouth 2 (two) times daily as needed (pain). 60 tablet 0  . metoprolol succinate (TOPROL-XL) 25 MG 24 hr tablet Take 0.5 tablets (12.5 mg total) by mouth 2 (two) times daily. 90 tablet 3  . nitroGLYCERIN (NITROSTAT) 0.4 MG SL tablet Place 1 tablet (0.4 mg total) under the tongue every 5 (five) minutes as needed for chest pain. 100 tablet 0  . oxyCODONE (ROXICODONE) 5 MG immediate release tablet Take 1 tablet (5 mg total) by mouth every 6 (six) hours as needed. 20 tablet 0  . potassium chloride SA (K-DUR,KLOR-CON) 20 MEQ tablet Take 1 tablet (20 mEq total) by mouth daily. 90 tablet 3  . SPIRIVA HANDIHALER 18 MCG inhalation capsule PLACE 1 CAPSULE INTO INHALER AND INHALE DAILY. 90 capsule 1  . tamsulosin (FLOMAX) 0.4 MG CAPS capsule Take 1 capsule (0.4 mg total) by mouth daily. 30 capsule 2  . UNABLE TO FIND Place 2 L into the nose continuous. Oxygen 2 liters.    . VENTOLIN HFA 108 (90 BASE) MCG/ACT inhaler INHALE 2 PUFFS BY MOUTH EVERY 4-6 HOURS AS NEEDED 18 g 5   No current facility-administered medications on file prior to visit.   No Known Allergies   ROS: See HPI for pertinent positives and negatives.  Physical Examination  Filed Vitals:   10/21/14 1340  BP: 96/51  Pulse: 71  Temp: 98.1 F (36.7 C)  TempSrc: Oral  Resp: 16  Height: 5\' 7"  (1.702 m)  Weight: 154 lb (69.854 kg)  SpO2: 93%   Body mass index is 24.11 kg/(m^2).  General: A&O x 3, WD, thin  Pulmonary: Sym exp, minimal air movt in all fields, no rales or rhonchi, inspiratory wheezing in posterior left fields. Using supplemental portable oxygen at  2L/min.  Cardiac: RRR, Nl S1, S2, no detected murmur  Vascular: Vessel Right Left  Radial Palpable Palpable  Carotid Palpable, without bruit Palpable, without bruit  Aorta not palpable N/A  Femoral Palpable Palpable  Popliteal Not palpable Not  palpable  PT Not Palpable(3+ pitting and non pitting edema) Not Palpable (3+ pitting and non pitting edema)  DP Not Palpable Not Palpable   Gastrointestinal: soft, NTND, -G/R, - HSM, - palpable masses, - CVAT B  Musculoskeletal: M/S 5/5 throughout , Extremities without ischemic changes. 3+ pitting and non pitting edema in both lower legs.    Neurologic: CN 2-12 intact , Pain and light touch intact in extremities , Motor exam as listed above         Non-Invasive Vascular Imaging  CTA Abd/Pelvis Duplex (Date: 10/21/2014)  ARTERIAL FINDINGS:  Aorta: Infrarenal aortic stent graft has been placed with limbs extending into the common iliac arteries bilaterally. The stent graft is patent. The aneurysm sac measures up to 5.0 cm and unchanged from the pre repair images. No evidence for an endoleak.  Celiac axis: Celiac trunk and main branch vessels are patent. There is a replaced left hepatic artery coming off the left gastric artery. Postsurgical changes at the GE junction.  Superior mesenteric: Atherosclerotic calcifications involving the proximal SMA without significant stenosis. Patient has a replaced right hepatic artery coming off the proximal SMA.  Left renal: Left renal artery is patent without significant stenosis.  Right renal: Right renal artery is patent with an early bifurcation.  Inferior mesenteric: Origin of the IMA is occluded but there is flow in the IMA branches.  Left iliac: Stent extends into the left common iliac artery. The left internal and external iliac arteries are patent with atherosclerotic calcifications. Proximal left femoral arteries are patent.  Right iliac: Stent  extends into the distal right common iliac artery. The right internal and external iliac arteries are patent with atherosclerotic calcifications. The proximal right femoral arteries are patent.  Venous findings: Poor visualization of the venous structures on this examination. There appears to be flow in the portal venous system.  IMPRESSION: Endovascular repair of the abdominal aortic aneurysm. The stent graft is patent and no evidence for an endoleak. The aneurysm sac is unchanged in size.   Medical Decision Making  Tommy Turner is a 74 y.o. male who presents s/p EVAR (Date: 09/14/2014).  Pt is asymptomatic with stable sac size. He has 3+ pitting and non pitting edema in his lower legs with a history of CHF; pt and wife were instructed in how to adequately elevate his legs to minimize edema.   Unfortunately he is still smoking. The patient was counseled re smoking cessation and given several free resources re smoking cessation.   I discussed with the patient the importance of surveillance of the endograft.  The next endograft duplex will be scheduled for 6 months.  The next CTA will be scheduled for 12 months.  The patient will follow up with [place] in 6 months with these studies.  I emphasized the importance of maximal medical management including strict control of blood pressure, blood glucose, and lipid levels, antiplatelet agents, obtaining regular exercise, and cessation of smoking.   The patient was given information about abdominal aortic endograft repair.   Thank you for allowing [place] to participate in this patient's care.  Clemon Chambers, RN, MSN, FNP-C [facility] [place] Office: [phone]  Clinic Physician: Scot Dock  10/21/2014, 1:47 PM

## 2014-11-09 ENCOUNTER — Telehealth: Payer: Self-pay | Admitting: Family Medicine

## 2014-11-09 MED ORDER — HYDROCODONE-ACETAMINOPHEN 10-325 MG PO TABS: 1.0000 | ORAL_TABLET | Freq: Two times a day (BID) | ORAL | Status: DC | PRN

## 2014-11-09 NOTE — Telephone Encounter (Signed)
Rx up front for patient pick up. Patient notified. 

## 2014-11-09 NOTE — Telephone Encounter (Signed)
oxyCODONE (ROXICODONE) 5 MG immediate release tablet HYDROcodone-acetaminophen (NORCO) 10-325 MG per tablet   Pt has both these meds listed as pain meds, his wife called to refill His pain meds but was unsure what they were. If only these two or just  One of them please refill a let pt know its ready for pick up. Unable to be  Seen till 7/26 for an OV.

## 2014-11-09 NOTE — Telephone Encounter (Signed)
May have Rx for hydrocodone 10mg /325,#60 1 q6 prn, not to use with oxycodone

## 2014-11-09 NOTE — Telephone Encounter (Signed)
Patient gets hydrocodone from Dr Nicki Reaper the oxycodone was prescribed by a different doctor 09/06/14

## 2014-11-24 ENCOUNTER — Other Ambulatory Visit: Payer: Self-pay | Admitting: Family Medicine

## 2014-11-30 ENCOUNTER — Ambulatory Visit (INDEPENDENT_AMBULATORY_CARE_PROVIDER_SITE_OTHER): Payer: 59 | Admitting: Family Medicine

## 2014-11-30 ENCOUNTER — Encounter: Payer: Self-pay | Admitting: Family Medicine

## 2014-11-30 VITALS — BP 102/70 | Ht 67.0 in | Wt 144.0 lb

## 2014-11-30 DIAGNOSIS — I10 Essential (primary) hypertension: Secondary | ICD-10-CM

## 2014-11-30 DIAGNOSIS — E785 Hyperlipidemia, unspecified: Secondary | ICD-10-CM

## 2014-11-30 DIAGNOSIS — R0902 Hypoxemia: Secondary | ICD-10-CM

## 2014-11-30 DIAGNOSIS — Z79891 Long term (current) use of opiate analgesic: Secondary | ICD-10-CM

## 2014-11-30 MED ORDER — HYDROCODONE-ACETAMINOPHEN 10-325 MG PO TABS: 1.0000 | ORAL_TABLET | Freq: Two times a day (BID) | ORAL | Status: DC | PRN

## 2014-11-30 MED ORDER — BENAZEPRIL HCL 20 MG PO TABS: 20.0000 mg | ORAL_TABLET | Freq: Every day | ORAL | Status: DC

## 2014-11-30 MED ORDER — CITALOPRAM HYDROBROMIDE 20 MG PO TABS: ORAL_TABLET | ORAL | Status: DC

## 2014-11-30 NOTE — Patient Instructions (Signed)

## 2014-11-30 NOTE — Progress Notes (Signed)
   Subjective:    Patient ID: Tommy Turner, male    DOB: 02-22-1941, 74 y.o.   MRN: [id number]  HPI This patient was seen today for chronic pain  The medication list was reviewed and updated.   -Compliance with pain medication: yes  The patient was advised the importance of maintaining medication and not using illegal substances with these.  Refills needed: yes  The patient was educated that we can provide 3 monthly scripts for their medication, it is their responsibility to follow the instructions.  Side effects or complications from medications: none  Patient is aware that pain medications are meant to minimize the severity of the pain to allow their pain levels to improve to allow for better function. They are aware of that pain medications cannot totally remove their pain.  Due for UDT ( at least once per year) : yes  Patient has no concerns at this time.   Patient relates a COPD is severe but he is hanging in there with the he is using his oxygen He relates his cholesterol is being treated with medication is tolerating well he states at times he gets a little dizzy when he stands up he relates he is using his medication for COPD patient also taking Lasix for chronic pedal edema   Blood pressure 96/60  Review of Systems He relates some shortness of breath especially without oxygen no recent infections some coughing no hemoptysis states appetite fair denies being depressed denies chest pressure pain    Objective:   Physical Exam  Has pedal edema but this is chronic lungs are clear but has poor tidal volume heart regular pulse normal skin warm dry senile purpura noted      Assessment & Plan:  Senile purpura-non-consequential Severe COPD-will get pulmonary consult I really don't think there is much more that can be done for this gentleman I did warn him that if any signs of pulmonary infection immediately be seen continue current measures. Up-to-date on pneumonia vaccines,  will get flu vaccine in the fall Cardiac condition stable Chronic pain patient uses medication responsibly takes 1 tablet maximum 3 times a day Pain contract explained Relative hypotension recommend reducing Lotensin from 40 mg to 20 mg, we will follow-up the patient again within 3 months Pedal edema stable

## 2014-12-07 LAB — TOXASSURE SELECT 13 (MW), URINE: PDF: 0

## 2015-02-08 ENCOUNTER — Other Ambulatory Visit: Payer: Self-pay | Admitting: Family Medicine

## 2015-02-11 ENCOUNTER — Ambulatory Visit (INDEPENDENT_AMBULATORY_CARE_PROVIDER_SITE_OTHER): Payer: 59 | Admitting: Urology

## 2015-02-11 DIAGNOSIS — R339 Retention of urine, unspecified: Secondary | ICD-10-CM

## 2015-02-11 DIAGNOSIS — R972 Elevated prostate specific antigen [PSA]: Secondary | ICD-10-CM | POA: Diagnosis not present

## 2015-03-02 ENCOUNTER — Ambulatory Visit: Payer: 59 | Admitting: Family Medicine

## 2015-03-08 ENCOUNTER — Other Ambulatory Visit: Payer: Self-pay | Admitting: Adult Health

## 2015-03-16 ENCOUNTER — Encounter: Payer: Self-pay | Admitting: Family Medicine

## 2015-03-16 ENCOUNTER — Ambulatory Visit (INDEPENDENT_AMBULATORY_CARE_PROVIDER_SITE_OTHER): Payer: 59 | Admitting: Family Medicine

## 2015-03-16 ENCOUNTER — Other Ambulatory Visit: Payer: Self-pay

## 2015-03-16 VITALS — BP 132/74 | Ht 67.0 in | Wt 141.0 lb

## 2015-03-16 DIAGNOSIS — R634 Abnormal weight loss: Secondary | ICD-10-CM | POA: Diagnosis not present

## 2015-03-16 DIAGNOSIS — R972 Elevated prostate specific antigen [PSA]: Secondary | ICD-10-CM | POA: Diagnosis not present

## 2015-03-16 DIAGNOSIS — J432 Centrilobular emphysema: Secondary | ICD-10-CM | POA: Diagnosis not present

## 2015-03-16 DIAGNOSIS — I10 Essential (primary) hypertension: Secondary | ICD-10-CM

## 2015-03-16 DIAGNOSIS — G894 Chronic pain syndrome: Secondary | ICD-10-CM | POA: Diagnosis not present

## 2015-03-16 DIAGNOSIS — Z79899 Other long term (current) drug therapy: Secondary | ICD-10-CM

## 2015-03-16 DIAGNOSIS — Z0289 Encounter for other administrative examinations: Secondary | ICD-10-CM

## 2015-03-16 DIAGNOSIS — Z23 Encounter for immunization: Secondary | ICD-10-CM | POA: Diagnosis not present

## 2015-03-16 DIAGNOSIS — E785 Hyperlipidemia, unspecified: Secondary | ICD-10-CM

## 2015-03-16 MED ORDER — HYDROCODONE-ACETAMINOPHEN 10-325 MG PO TABS: 1.0000 | ORAL_TABLET | Freq: Two times a day (BID) | ORAL | Status: DC | PRN

## 2015-03-16 MED ORDER — TIOTROPIUM BROMIDE MONOHYDRATE 18 MCG IN CAPS: ORAL_CAPSULE | RESPIRATORY_TRACT | Status: DC

## 2015-03-16 NOTE — Progress Notes (Signed)
   Subjective:    Patient ID: Tommy Turner, male    DOB: October 28, 1940, 74 y.o.   MRN: [id number]  HPI This patient was seen today for chronic pain  The medication list was reviewed and updated.   -Compliance with medication: takes as prescribed  - Number patient states they take daily: takes 2 or 3 a day  -when was the last dose patient took? Can't remember if he took this am or last night.   The patient was advised the importance of maintaining medication and not using illegal substances with these.  Refills needed: yes  The patient was educated that we can provide 3 monthly scripts for their medication, it is their responsibility to follow the instructions.  Side effects or complications from medications: none  Patient is aware that pain medications are meant to minimize the severity of the pain to allow their pain levels to improve to allow for better function. They are aware of that pain medications cannot totally remove their pain.  Due for UDT ( at least once per year) : done 11/30/14  Concerns about weight loss. Pt states he eats well and snacks all day and cannot gain weight.   Flu vaccine today.  Patient states he takes his blood pressure medicine regular basis denies any cardiac pain no chest pressure tightness or pain He is not smoking currently using his oxygen on regular basis and using inhalers intermittently finds that his overall breathing is fair. He does get short of breath if he pushes himself 2 months. No recent illnesses Has hyperlipidemia needs lipid liver profile. In addition to this takes his medicine he tries to eat healthy History of elevated PSA he follows with urologist every 6 months his urinations well with medication Patient has been losing some weight he often only when he feels hungry doesn't always feel that hungry throughout the day is encouraged rich foods high in calories to try to bring his weight up.     Review of Systems  Constitutional:  Negative for activity change.  Gastrointestinal: Negative for vomiting and abdominal pain.  Neurological: Negative for weakness.  Psychiatric/Behavioral: Negative for confusion.       Objective:   Physical Exam  Constitutional: He appears well-nourished.  Cardiovascular: Normal rate, regular rhythm and normal heart sounds.   No murmur heard. Pulmonary/Chest: Effort normal and breath sounds normal.  Musculoskeletal: He exhibits no edema.  Lymphadenopathy:    He has no cervical adenopathy.  Neurological: He is alert.  Psychiatric: His behavior is normal.  Vitals reviewed.    25 minutes was spent with the patient. Greater than half the time was spent in discussion and answering questions and counseling regarding the issues that the patient came in for today.      Assessment & Plan:  Blood pressure good control continue current measures Emphysema continue oxygen use prescribed inhalers Hyperlipidemia check lab work continue medication watch diet Chronic pain uses pain medicine responsibly denies overusing it does not cause drowsiness Elevated PSA followed by urology Loss of weight encourage increase appetite increased calories throughout Richard foods I do not feel the patient is dealing with cancer. We will watch his weight closely follow-up 3-4 months follow-up in the winter if any significant

## 2015-03-16 NOTE — Patient Instructions (Signed)
As part of your visit today we have covered your chronic pain. You have been given prescription(s) for pain medicines.The [place]A and [place] require that any patient on pain medications must be seen every 3 months. You are expected to come in for a office visit before further pain medications are issued.  Since we are managing your pain do not get pain scripts from other doctors. We check the prescription registry regularly. If you are receiving pain medicines from another source we will STOP prescribing pain medicines.   We will not refill medications or early nor will we give an extended month supply at the end of these prescriptions.It is your responsibility to keep up with medications. They will not be replaced.  It is your responsibility to schedule an office visit in 3 months to be seen before you are out of your medication. Do not call our office to request early refills or additional refills. Do not wait till the last moment to schedule the follow up visit. We highly recommend you schedule this now for 3 months.  We believe that most patients take their meds as prescribed but drug misuse and diversion is a serious problem in the [place]. Our office does standard measures to insure proper care to all. All patients are subject to random urine drug screens/ saliva tests and random pill counts. Also all patients drug prescription records are reviewed on a regular basis in accordance with [facility] medical board policies.  Remember, do not use alcohol or illegal drugs with your pain medications. If you are feeling drowsy or affected by your medicine you are not to operate any machinery , do any dangerous activities or drive while this is occurring.   We are required by law to adhere to strict regulations. Failure on our part to follow these regulations could jeopardize our prescription license which in turn would cause [place] not to be able to care for you.Thank you for your understanding and following  these policies.   Follow up in March

## 2015-04-11 ENCOUNTER — Other Ambulatory Visit: Payer: Self-pay | Admitting: Adult Health

## 2015-04-22 ENCOUNTER — Ambulatory Visit: Payer: 59 | Admitting: Family

## 2015-04-22 ENCOUNTER — Other Ambulatory Visit (HOSPITAL_COMMUNITY): Payer: 59

## 2015-04-25 ENCOUNTER — Other Ambulatory Visit: Payer: Self-pay | Admitting: Adult Health

## 2015-04-29 ENCOUNTER — Other Ambulatory Visit: Payer: Self-pay | Admitting: Adult Health

## 2015-05-02 ENCOUNTER — Inpatient Hospital Stay (HOSPITAL_COMMUNITY)
Admission: EM | Admit: 2015-05-02 | Discharge: 2015-05-10 | DRG: 190 | Disposition: A | Discharge status: Skilled Nursing Facility | Payer: 59 | Attending: Internal Medicine | Admitting: Internal Medicine

## 2015-05-02 ENCOUNTER — Emergency Department (HOSPITAL_COMMUNITY): Payer: 59

## 2015-05-02 ENCOUNTER — Encounter (HOSPITAL_COMMUNITY): Payer: Self-pay | Admitting: *Deleted

## 2015-05-02 DIAGNOSIS — R278 Other lack of coordination: Secondary | ICD-10-CM | POA: Diagnosis not present

## 2015-05-02 DIAGNOSIS — Z951 Presence of aortocoronary bypass graft: Secondary | ICD-10-CM | POA: Diagnosis not present

## 2015-05-02 DIAGNOSIS — I2510 Atherosclerotic heart disease of native coronary artery without angina pectoris: Secondary | ICD-10-CM | POA: Diagnosis not present

## 2015-05-02 DIAGNOSIS — I714 Abdominal aortic aneurysm, without rupture, unspecified: Secondary | ICD-10-CM | POA: Diagnosis present

## 2015-05-02 DIAGNOSIS — Z825 Family history of asthma and other chronic lower respiratory diseases: Secondary | ICD-10-CM | POA: Diagnosis not present

## 2015-05-02 DIAGNOSIS — I5022 Chronic systolic (congestive) heart failure: Secondary | ICD-10-CM | POA: Diagnosis not present

## 2015-05-02 DIAGNOSIS — Z903 Acquired absence of stomach [part of]: Secondary | ICD-10-CM | POA: Diagnosis not present

## 2015-05-02 DIAGNOSIS — J9622 Acute and chronic respiratory failure with hypercapnia: Secondary | ICD-10-CM | POA: Diagnosis present

## 2015-05-02 DIAGNOSIS — Z809 Family history of malignant neoplasm, unspecified: Secondary | ICD-10-CM

## 2015-05-02 DIAGNOSIS — J42 Unspecified chronic bronchitis: Secondary | ICD-10-CM | POA: Diagnosis not present

## 2015-05-02 DIAGNOSIS — F17200 Nicotine dependence, unspecified, uncomplicated: Secondary | ICD-10-CM | POA: Diagnosis not present

## 2015-05-02 DIAGNOSIS — M6281 Muscle weakness (generalized): Secondary | ICD-10-CM | POA: Diagnosis not present

## 2015-05-02 DIAGNOSIS — J9612 Chronic respiratory failure with hypercapnia: Secondary | ICD-10-CM

## 2015-05-02 DIAGNOSIS — Z79899 Other long term (current) drug therapy: Secondary | ICD-10-CM | POA: Diagnosis not present

## 2015-05-02 DIAGNOSIS — Z7951 Long term (current) use of inhaled steroids: Secondary | ICD-10-CM

## 2015-05-02 DIAGNOSIS — I110 Hypertensive heart disease with heart failure: Secondary | ICD-10-CM | POA: Diagnosis present

## 2015-05-02 DIAGNOSIS — Z833 Family history of diabetes mellitus: Secondary | ICD-10-CM | POA: Diagnosis not present

## 2015-05-02 DIAGNOSIS — J9611 Chronic respiratory failure with hypoxia: Secondary | ICD-10-CM | POA: Diagnosis present

## 2015-05-02 DIAGNOSIS — E872 Acidosis: Secondary | ICD-10-CM | POA: Diagnosis present

## 2015-05-02 DIAGNOSIS — R0602 Shortness of breath: Secondary | ICD-10-CM | POA: Diagnosis present

## 2015-05-02 DIAGNOSIS — Z8249 Family history of ischemic heart disease and other diseases of the circulatory system: Secondary | ICD-10-CM | POA: Diagnosis not present

## 2015-05-02 DIAGNOSIS — E43 Unspecified severe protein-calorie malnutrition: Secondary | ICD-10-CM | POA: Insufficient documentation

## 2015-05-02 DIAGNOSIS — J9601 Acute respiratory failure with hypoxia: Secondary | ICD-10-CM | POA: Diagnosis not present

## 2015-05-02 DIAGNOSIS — R262 Difficulty in walking, not elsewhere classified: Secondary | ICD-10-CM | POA: Diagnosis not present

## 2015-05-02 DIAGNOSIS — E785 Hyperlipidemia, unspecified: Secondary | ICD-10-CM | POA: Diagnosis present

## 2015-05-02 DIAGNOSIS — D696 Thrombocytopenia, unspecified: Secondary | ICD-10-CM | POA: Diagnosis present

## 2015-05-02 DIAGNOSIS — I252 Old myocardial infarction: Secondary | ICD-10-CM

## 2015-05-02 DIAGNOSIS — F17210 Nicotine dependence, cigarettes, uncomplicated: Secondary | ICD-10-CM | POA: Diagnosis present

## 2015-05-02 DIAGNOSIS — Z6821 Body mass index (BMI) 21.0-21.9, adult: Secondary | ICD-10-CM | POA: Diagnosis not present

## 2015-05-02 DIAGNOSIS — R1312 Dysphagia, oropharyngeal phase: Secondary | ICD-10-CM | POA: Diagnosis not present

## 2015-05-02 DIAGNOSIS — R0600 Dyspnea, unspecified: Secondary | ICD-10-CM | POA: Diagnosis not present

## 2015-05-02 DIAGNOSIS — J9621 Acute and chronic respiratory failure with hypoxia: Secondary | ICD-10-CM | POA: Diagnosis not present

## 2015-05-02 DIAGNOSIS — J441 Chronic obstructive pulmonary disease with (acute) exacerbation: Secondary | ICD-10-CM | POA: Diagnosis not present

## 2015-05-02 DIAGNOSIS — G894 Chronic pain syndrome: Secondary | ICD-10-CM | POA: Diagnosis present

## 2015-05-02 DIAGNOSIS — Z7982 Long term (current) use of aspirin: Secondary | ICD-10-CM | POA: Diagnosis not present

## 2015-05-02 DIAGNOSIS — Z9981 Dependence on supplemental oxygen: Secondary | ICD-10-CM | POA: Diagnosis not present

## 2015-05-02 DIAGNOSIS — I10 Essential (primary) hypertension: Secondary | ICD-10-CM | POA: Diagnosis not present

## 2015-05-02 DIAGNOSIS — Z79891 Long term (current) use of opiate analgesic: Secondary | ICD-10-CM | POA: Diagnosis not present

## 2015-05-02 DIAGNOSIS — G9340 Encephalopathy, unspecified: Secondary | ICD-10-CM | POA: Diagnosis not present

## 2015-05-02 DIAGNOSIS — I509 Heart failure, unspecified: Secondary | ICD-10-CM | POA: Diagnosis not present

## 2015-05-02 LAB — CBC WITH DIFFERENTIAL/PLATELET
Basophils Absolute: 0 10*3/uL (ref 0.0–0.1)
Basophils Relative: 0 %
Eosinophils Absolute: 0 10*3/uL (ref 0.0–0.7)
Eosinophils Relative: 0 %
HCT: 46.7 % (ref 39.0–52.0)
Hemoglobin: 14.9 g/dL (ref 13.0–17.0)
Lymphocytes Relative: 5 %
Lymphs Abs: 0.5 10*3/uL — ABNORMAL LOW (ref 0.7–4.0)
MCH: 32 pg (ref 26.0–34.0)
MCHC: 31.9 g/dL (ref 30.0–36.0)
MCV: 100.2 fL — ABNORMAL HIGH (ref 78.0–100.0)
Monocytes Absolute: 0.7 10*3/uL (ref 0.1–1.0)
Monocytes Relative: 7 %
Neutro Abs: 8.9 10*3/uL — ABNORMAL HIGH (ref 1.7–7.7)
Neutrophils Relative %: 88 %
Platelets: 106 10*3/uL — ABNORMAL LOW (ref 150–400)
RBC: 4.66 MIL/uL (ref 4.22–5.81)
RDW: 13.4 % (ref 11.5–15.5)
WBC: 10.1 10*3/uL (ref 4.0–10.5)

## 2015-05-02 LAB — TSH: TSH: 0.431 u[IU]/mL (ref 0.350–4.500)

## 2015-05-02 LAB — BASIC METABOLIC PANEL
Anion gap: 10 (ref 5–15)
BUN: 15 mg/dL (ref 6–20)
CO2: 39 mmol/L — ABNORMAL HIGH (ref 22–32)
Calcium: 9.5 mg/dL (ref 8.9–10.3)
Chloride: 88 mmol/L — ABNORMAL LOW (ref 101–111)
Creatinine, Ser: 0.58 mg/dL — ABNORMAL LOW (ref 0.61–1.24)
GFR calc Af Amer: >60 mL/min (ref >60)
GFR calc non Af Amer: >60 mL/min (ref >60)
Glucose, Bld: 152 mg/dL — ABNORMAL HIGH (ref 65–99)
Potassium: 4.1 mmol/L (ref 3.5–5.1)
Sodium: 137 mmol/L (ref 135–145)

## 2015-05-02 LAB — BRAIN NATRIURETIC PEPTIDE: B Natriuretic Peptide: 580 pg/mL — ABNORMAL HIGH (ref 0.0–100.0)

## 2015-05-02 LAB — TROPONIN I: Troponin I: 0.03 ng/mL (ref <0.031)

## 2015-05-02 MED ORDER — TAMSULOSIN HCL 0.4 MG PO CAPS: 0.4000 mg | ORAL_CAPSULE | Freq: Every day | ORAL | Status: DC | PRN

## 2015-05-02 MED ORDER — LEVOFLOXACIN IN D5W 750 MG/150ML IV SOLN
750.0000 mg | INTRAVENOUS | Status: DC
  Administered 2015-05-02 – 2015-05-06 (×5): 750 mg via INTRAVENOUS
  Filled 2015-05-02 (×5): qty 150

## 2015-05-02 MED ORDER — ALBUTEROL SULFATE (2.5 MG/3ML) 0.083% IN NEBU
5.0000 mg | INHALATION_SOLUTION | Freq: Once | RESPIRATORY_TRACT | Status: AC
  Administered 2015-05-02: 5 mg via RESPIRATORY_TRACT
  Filled 2015-05-02: qty 6

## 2015-05-02 MED ORDER — IPRATROPIUM-ALBUTEROL 0.5-2.5 (3) MG/3ML IN SOLN
3.0000 mL | Freq: Once | RESPIRATORY_TRACT | Status: AC
  Administered 2015-05-02: 3 mL via RESPIRATORY_TRACT
  Filled 2015-05-02: qty 3

## 2015-05-02 MED ORDER — ATORVASTATIN CALCIUM 40 MG PO TABS
80.0000 mg | ORAL_TABLET | Freq: Every day | ORAL | Status: DC
  Administered 2015-05-02 – 2015-05-10 (×9): 80 mg via ORAL
  Filled 2015-05-02 (×10): qty 2

## 2015-05-02 MED ORDER — ALBUTEROL SULFATE (2.5 MG/3ML) 0.083% IN NEBU
2.5000 mg | INHALATION_SOLUTION | Freq: Four times a day (QID) | RESPIRATORY_TRACT | Status: DC
  Administered 2015-05-02 – 2015-05-10 (×33): 2.5 mg via RESPIRATORY_TRACT
  Filled 2015-05-02 (×31): qty 3

## 2015-05-02 MED ORDER — POTASSIUM CHLORIDE CRYS ER 20 MEQ PO TBCR
20.0000 meq | EXTENDED_RELEASE_TABLET | Freq: Every day | ORAL | Status: DC
  Administered 2015-05-02 – 2015-05-10 (×9): 20 meq via ORAL
  Filled 2015-05-02 (×9): qty 1

## 2015-05-02 MED ORDER — METHYLPREDNISOLONE SODIUM SUCC 40 MG IJ SOLR
40.0000 mg | Freq: Four times a day (QID) | INTRAMUSCULAR | Status: DC
  Administered 2015-05-02 – 2015-05-07 (×20): 40 mg via INTRAVENOUS
  Filled 2015-05-02 (×20): qty 1

## 2015-05-02 MED ORDER — METHYLPREDNISOLONE SODIUM SUCC 125 MG IJ SOLR
80.0000 mg | Freq: Once | INTRAMUSCULAR | Status: AC
  Administered 2015-05-02: 80 mg via INTRAVENOUS
  Filled 2015-05-02: qty 2

## 2015-05-02 MED ORDER — ASPIRIN EC 81 MG PO TBEC
81.0000 mg | DELAYED_RELEASE_TABLET | Freq: Every day | ORAL | Status: DC
  Administered 2015-05-02 – 2015-05-10 (×9): 81 mg via ORAL
  Filled 2015-05-02 (×9): qty 1

## 2015-05-02 MED ORDER — FUROSEMIDE 10 MG/ML IJ SOLN
40.0000 mg | Freq: Two times a day (BID) | INTRAMUSCULAR | Status: DC
  Administered 2015-05-02 – 2015-05-07 (×10): 40 mg via INTRAVENOUS
  Filled 2015-05-02 (×11): qty 4

## 2015-05-02 MED ORDER — BENAZEPRIL HCL 10 MG PO TABS
40.0000 mg | ORAL_TABLET | Freq: Every day | ORAL | Status: DC
  Administered 2015-05-02 – 2015-05-08 (×7): 40 mg via ORAL
  Filled 2015-05-02 (×9): qty 4

## 2015-05-02 MED ORDER — BUDESONIDE-FORMOTEROL FUMARATE 160-4.5 MCG/ACT IN AERO
2.0000 | INHALATION_SPRAY | Freq: Two times a day (BID) | RESPIRATORY_TRACT | Status: DC
  Administered 2015-05-02 – 2015-05-10 (×13): 2 via RESPIRATORY_TRACT
  Filled 2015-05-02 (×2): qty 6

## 2015-05-02 MED ORDER — ENSURE ENLIVE PO LIQD
237.0000 mL | Freq: Two times a day (BID) | ORAL | Status: DC
  Administered 2015-05-02 – 2015-05-03 (×2): 237 mL via ORAL

## 2015-05-02 MED ORDER — METOPROLOL SUCCINATE ER 25 MG PO TB24
12.5000 mg | ORAL_TABLET | Freq: Two times a day (BID) | ORAL | Status: DC
  Administered 2015-05-02 – 2015-05-09 (×15): 12.5 mg via ORAL
  Filled 2015-05-02 (×17): qty 1

## 2015-05-02 MED ORDER — TIOTROPIUM BROMIDE MONOHYDRATE 18 MCG IN CAPS
18.0000 ug | ORAL_CAPSULE | Freq: Every day | RESPIRATORY_TRACT | Status: DC
  Administered 2015-05-03 – 2015-05-10 (×7): 18 ug via RESPIRATORY_TRACT
  Filled 2015-05-02 (×3): qty 5

## 2015-05-02 MED ORDER — FUROSEMIDE 10 MG/ML IJ SOLN
60.0000 mg | Freq: Once | INTRAMUSCULAR | Status: AC
  Administered 2015-05-02: 60 mg via INTRAVENOUS
  Filled 2015-05-02: qty 6

## 2015-05-02 MED ORDER — ONDANSETRON HCL 4 MG/2ML IJ SOLN: 4.0000 mg | Freq: Four times a day (QID) | INTRAMUSCULAR | Status: DC | PRN

## 2015-05-02 MED ORDER — ALBUTEROL SULFATE (2.5 MG/3ML) 0.083% IN NEBU: 2.5000 mg | INHALATION_SOLUTION | RESPIRATORY_TRACT | Status: DC | PRN

## 2015-05-02 MED ORDER — ALPRAZOLAM 0.5 MG PO TABS: 0.5000 mg | ORAL_TABLET | Freq: Three times a day (TID) | ORAL | Status: DC | PRN

## 2015-05-02 MED ORDER — ENOXAPARIN SODIUM 40 MG/0.4ML ~~LOC~~ SOLN
40.0000 mg | SUBCUTANEOUS | Status: DC
  Administered 2015-05-02 – 2015-05-08 (×7): 40 mg via SUBCUTANEOUS
  Filled 2015-05-02 (×8): qty 0.4

## 2015-05-02 MED ORDER — CITALOPRAM HYDROBROMIDE 20 MG PO TABS
20.0000 mg | ORAL_TABLET | Freq: Every day | ORAL | Status: DC
  Administered 2015-05-02 – 2015-05-10 (×9): 20 mg via ORAL
  Filled 2015-05-02 (×9): qty 1

## 2015-05-02 MED ORDER — ONDANSETRON HCL 4 MG PO TABS: 4.0000 mg | ORAL_TABLET | Freq: Four times a day (QID) | ORAL | Status: DC | PRN

## 2015-05-02 MED ORDER — SODIUM CHLORIDE 0.9 % IJ SOLN
3.0000 mL | Freq: Two times a day (BID) | INTRAMUSCULAR | Status: DC
  Administered 2015-05-02 – 2015-05-10 (×16): 3 mL via INTRAVENOUS

## 2015-05-02 MED ORDER — CETYLPYRIDINIUM CHLORIDE 0.05 % MT LIQD
7.0000 mL | Freq: Two times a day (BID) | OROMUCOSAL | Status: DC
  Administered 2015-05-02 – 2015-05-10 (×17): 7 mL via OROMUCOSAL

## 2015-05-02 NOTE — ED Provider Notes (Signed)
History  By signing my name below, I, Marlowe Kays, attest that this documentation has been prepared under the direction and in the presence of Virgel Manifold, MD. Electronically Signed: Marlowe Kays, ED Scribe. 05/02/2015. 9:43 AM.  Chief Complaint  Patient presents with  . Shortness of Breath   The history is provided by the patient and medical records. No language interpreter was used.    HPI Comments:  Tommy Turner is a 74 y.o. male with PMHx of COPD and CHF who presents to the Emergency Department complaining of worsening SOB that began approximately one month ago. He states the SOB has been worse the past couple of days. He is on home oxygen of 2 L/min continuously. He reports associated cough and family reports decreased in eating the past few days. He has not taken anything for his symptoms. He denies modifying factors. He denies pain, fever, nausea or vomiting. PCP is Dr. Wolfgang Phoenix. Pt had a AAA about 6 months ago and CABG more than 20 years ago.  Past Medical History  Diagnosis Date  . AAA (abdominal aortic aneurysm) ([place])   . Hyperlipidemia     takes Atorvastatin daily  . CAD (coronary artery disease)   . Ulcer     gastric  . Aneurysm ([place])   . Anxiety     takes Xanax daily as needed  . Depression     takes Celexa daily  . Hypertension     takes Metoprolol and Benazepril  daily  . Peripheral edema     takes Furosemide daily  . Myocardial infarction ([place])     20+yrs ago  . COPD (chronic obstructive pulmonary disease) (HCC)     Albuterol inhaler and neb daily as needed;takes SPiriva daily  . History of shingles   . CHF (congestive heart failure) ([place])   . Cancer ([place])   . Shortness of breath      on oxygen  2 liters continuous  . Headache(784.0)     occasionally   Past Surgical History  Procedure Laterality Date  . Lipoma excision      right lower back  . Partial gastrectomy      approx 1990  . Hernia repair Left     inguinal  . Tonsillectomy       as a child  . Esophagogastroduodenoscopy    . Coronary artery bypass graft  20+yrs ago    x 6  . Abdominal aortic endovascular stent graft N/A 09/14/2014    Procedure: ABDOMINAL AORTIC ENDOVASCULAR STENT GRAFT;  Surgeon: Conrad [place]ville, MD;  Location: [facility] OR;  Service: Vascular;  Laterality: N/A;   Family History  Problem Relation Age of Onset  . Hypertension Brother   . Heart disease Brother     Heart Disease before age 31  . Heart attack Brother   . Cancer Brother   . Cancer Mother   . Diabetes Mother   . Heart disease Mother     Heart Disease before age 3  . Hypertension Mother   . Heart attack Mother   . Cancer Father     prostate  . Hypertension Father   . COPD Sister   . Diabetes Sister   . Heart disease Sister   . Hypertension Sister   . Heart attack Sister   . Colon cancer Neg Hx    Social History  Substance Use Topics  . Smoking status: Light Tobacco Smoker -- 0.25 packs/day for 58 years    Types: Cigarettes  . Smokeless  tobacco: Former Systems developer     Comment: vapor ciggs  . Alcohol Use: No    Review of Systems  Constitutional: Negative for fever and appetite change.  Respiratory: Positive for cough and shortness of breath.   Cardiovascular: Negative for chest pain.  Gastrointestinal: Negative for nausea and vomiting.  Musculoskeletal: Negative for myalgias and arthralgias.  All other systems reviewed and are negative.   Allergies  Review of patient's allergies indicates no known allergies.  Home Medications   Prior to Admission medications   Medication Sig Start Date End Date Taking? Authorizing Provider  albuterol (PROVENTIL) (2.5 MG/3ML) 0.083% nebulizer solution USE 1 VIAL IN NEBULIZER EVERY 4 HOURS AS NEEDED Patient taking differently: USE 1 VIAL IN NEBULIZER EVERY 4 HOURS AS NEEDED WHEEZING. 02/08/15  Yes [name], MD  ALPRAZolam Duanne Moron) 1 MG tablet Take 0.5-1 mg by mouth 3 (three) times daily as needed for anxiety (Takes one-half tablet in the  morning and afternoon. takes one tablet at bedtime daily).   Yes Historical Provider, MD  aspirin 81 MG tablet Take 81 mg by mouth daily.   Yes Historical Provider, MD  atorvastatin (LIPITOR) 80 MG tablet TAKE 1 TABLET BY MOUTH DAILY. 03/08/15  Yes Arnoldo Lenis, MD  benazepril (LOTENSIN) 40 MG tablet TAKE 1 TABLET BY MOUTH DAILY. 04/25/15  Yes Lendon Colonel, NP  budesonide-formoterol [facility]) 160-4.5 MCG/ACT inhaler Inhale 2 puffs into the lungs 2 (two) times daily. 04/12/13  Yes Kathie Dike, MD  citalopram (CELEXA) 20 MG tablet 1/2 qd 11/30/14  Yes [name], MD  furosemide (LASIX) 40 MG tablet TAKE 1 TABLET BY MOUTH 2 TIMES DAILY. 04/29/15  Yes Lendon Colonel, NP  HYDROcodone-acetaminophen (NORCO) 10-325 MG tablet Take 1 tablet by mouth 2 (two) times daily as needed (pain). 03/16/15  Yes [name], MD  metoprolol succinate (TOPROL-XL) 25 MG 24 hr tablet Take 0.5 tablets (12.5 mg total) by mouth 2 (two) times daily. 04/13/14  Yes Lendon Colonel, NP  nitroGLYCERIN (NITROSTAT) 0.4 MG SL tablet Place 1 tablet (0.4 mg total) under the tongue every 5 (five) minutes as needed for chest pain. 10/27/13  Yes [name], MD  potassium chloride SA (K-DUR,KLOR-CON) 20 MEQ tablet TAKE 1 TABLET BY MOUTH DAILY. 04/11/15  Yes Arnoldo Lenis, MD  tamsulosin (FLOMAX) 0.4 MG CAPS capsule Take 1 capsule (0.4 mg total) by mouth daily. Patient taking differently: Take 0.4 mg by mouth daily as needed (slow urination).  09/15/14  Yes Samantha J Rhyne, PA-C  tiotropium (SPIRIVA HANDIHALER) 18 MCG inhalation capsule PLACE 1 CAPSULE INTO INHALER AND INHALE DAILY 03/16/15  Yes Scott A Luking, MD  VENTOLIN HFA 108 (90 BASE) MCG/ACT inhaler INHALE 2 PUFFS BY MOUTH EVERY 4-6 HOURS AS NEEDED 11/24/14  Yes [name], MD  benazepril (LOTENSIN) 20 MG tablet Take 1 tablet (20 mg total) by mouth daily. Patient not taking: Reported on 05/02/2015 11/30/14   [name], MD  UNABLE TO FIND Place 2 L  into the nose continuous. Oxygen 2 liters.    Historical Provider, MD   Triage Vitals: BP 138/83 mmHg  Pulse 102  Temp(Src) 97.9 F (36.6 C) (Oral)  Resp 26  Wt 135 lb (61.236 kg)  SpO2 87% Physical Exam  Constitutional: He is oriented to person, place, and time. He appears well-developed and well-nourished.  HENT:  Head: Normocephalic.  Eyes: EOM are normal.  Neck: Normal range of motion.  Pulmonary/Chest: Effort normal. He has wheezes.  Mild wheezing bilaterally. Tachypneic. Symmetric pitting lower extremity edema.  Abdominal: He exhibits no distension.  Musculoskeletal: Normal range of motion.  Neurological: He is alert and oriented to person, place, and time.  Psychiatric: He has a normal mood and affect.  Nursing note and vitals reviewed.   ED Course  Procedures (including critical care time) DIAGNOSTIC STUDIES: Oxygen Saturation is 87% on 2 L/min [place], low by my interpretation.   COORDINATION OF CARE: 9:17 AM- Will wait for CXR to result and order nebulizer treatment. Pt verbalizes understanding and agrees to plan.  Medications  ipratropium-albuterol (DUONEB) 0.5-2.5 (3) MG/3ML nebulizer solution 3 mL (not administered)  methylPREDNISolone sodium succinate (SOLU-MEDROL) 125 mg/2 mL injection 80 mg (not administered)    Labs Review Labs Reviewed  BRAIN NATRIURETIC PEPTIDE - Abnormal; Notable for the following:    B Natriuretic Peptide 580.0 (*)    All other components within normal limits  CBC WITH DIFFERENTIAL/PLATELET - Abnormal; Notable for the following:    MCV 100.2 (*)    Platelets 106 (*)    Neutro Abs 8.9 (*)    Lymphs Abs 0.5 (*)    All other components within normal limits  BASIC METABOLIC PANEL - Abnormal; Notable for the following:    Chloride 88 (*)    CO2 39 (*)    Glucose, Bld 152 (*)    Creatinine, Ser 0.58 (*)    All other components within normal limits  BASIC METABOLIC PANEL - Abnormal; Notable for the following:    Chloride 88 (*)    CO2  45 (*)    Glucose, Bld 151 (*)    Creatinine, Ser 0.41 (*)    All other components within normal limits  CBC - Abnormal; Notable for the following:    WBC 14.6 (*)    MCV 100.4 (*)    Platelets 111 (*)    All other components within normal limits  BLOOD GAS, ARTERIAL - Abnormal; Notable for the following:    pH, Arterial 7.168 (*)    pCO2 arterial 146.0 (*)    pO2, Arterial 167.0 (*)    Bicarbonate 39.0 (*)    Acid-Base Excess 21.7 (*)    All other components within normal limits  CBC - Abnormal; Notable for the following:    WBC 12.9 (*)    MCV 104.2 (*)    MCHC 29.9 (*)    Platelets 130 (*)    All other components within normal limits  BASIC METABOLIC PANEL - Abnormal; Notable for the following:    Chloride 89 (*)    CO2 49 (*)    Glucose, Bld 165 (*)    BUN 32 (*)    Anion gap 3 (*)    All other components within normal limits  AMMONIA - Abnormal; Notable for the following:    Ammonia 102 (*)    All other components within normal limits  URINALYSIS, ROUTINE W REFLEX MICROSCOPIC (NOT AT [facility]) - Abnormal; Notable for the following:    Specific Gravity, Urine >1.030 (*)    All other components within normal limits  BLOOD GAS, ARTERIAL - Abnormal; Notable for the following:    pCO2 arterial 72.2 (*)    pO2, Arterial 53.6 (*)    Bicarbonate 43.8 (*)    Acid-Base Excess 22.4 (*)    All other components within normal limits  BLOOD GAS, ARTERIAL - Abnormal; Notable for the following:    pH, Arterial 7.456 (*)    pCO2 arterial 68.4 (*)  pO2, Arterial 58.0 (*)    Bicarbonate 43.3 (*)    Acid-Base Excess 21.7 (*)    All other components within normal limits  MRSA PCR SCREENING  URINE CULTURE  TROPONIN I  TSH    Imaging Review Dg Chest 2 View  05/02/2015  CLINICAL DATA:  Worsening shortness of breath for the last month. History of COPD and cancer. EXAM: CHEST  2 VIEW COMPARISON:  09/14/2014 and 01/19/2014 radiographs. Abdominal CT 08/13/2014 and 10/21/2014. Chest CT  02/24/2013. FINDINGS: The heart size and mediastinal contours are stable status post CABG. The lungs are chronically hyperinflated with generalized interstitial prominence, unchanged from priors. There is chronic blunting of costophrenic angles. No airspace disease, definite edema or significant pleural effusion. The bones appear unchanged. IMPRESSION: Stable findings of severe chronic obstructive pulmonary disease. No acute process identified. Electronically Signed   By: Richardean Sale M.D.   On: 05/02/2015 09:28   I have personally reviewed and evaluated these images and lab results as part of my medical decision-making.   EKG Interpretation   Date/Time:  Monday May 02 2015 08:22:54 EST Ventricular Rate:  99 PR Interval:  205 QRS Duration: 147 QT Interval:  400 QTC Calculation: 513 R Axis:   149 Text Interpretation:  Sinus tachycardia Paired ventricular premature  complexes LAE, consider biatrial enlargement Consider left ventricular  hypertrophy Anterolateral infarct, recent Prolonged QT interval ED  PHYSICIAN INTERPRETATION AVAILABLE IN [place] [place] Confirmed by TEST,  Record ([id number]) on 05/03/2015 7:10:12 AM      MDM   Final diagnoses:  Acute on chronic respiratory failure with hypoxia (HCC)    I personally preformed the services scribed in my presence. The recorded information has been reviewed is accurate. Virgel Manifold, MD.     Virgel Manifold, MD 05/05/15 [id number]

## 2015-05-02 NOTE — ED Notes (Signed)
Pt states he has had worsening shortness of breath for the last month. Pt is continuously on oxygen, upon triage he is unable to complete sentences without taking a breath. Pt denies any pain.

## 2015-05-02 NOTE — H&P (Signed)
[facility] History and Physical  Tommy Turner [id number] DOB: 02-Jan-1941    PCP:   Sallee Lange, MD   Chief Complaint:  SOB, increase for the past 2 days.   HPI: Tommy Turner is an 74 y.o. male with hx of severe COPD, on 2 L [place], continued to smoke, AAA with recent repaired, CAD s/p CABG, hx of CHF, anxiety, HTN, brought to the ER as he has been more SOB these past 2 days. Evaluation in the ER included a CXR showing stable COPD, no infiltrate, no failure.   His WBC was normal, and electrolytes, and renal fx tests were unremarkalbe.  His troponin was negative, and EKG showed no acute ST T changes.  He was given IV Lasix, and IV steroid, and hospitalist was asked to admit him for COPD exacerbation.  His said his breathing status is only slightly worse than baseline.  His wife stated he has been breathing like this as his baseline.   Rewiew of Systems:  Constitutional: Negative for malaise, fever and chills. No significant weight loss or weight gain Eyes: Negative for eye pain, redness and discharge, diplopia, visual changes, or flashes of light. ENMT: Negative for ear pain, hoarseness, nasal congestion, sinus pressure and sore throat. No headaches; tinnitus, drooling, or problem swallowing. Cardiovascular: Negative for chest pain, palpitations, diaphoresis,  and peripheral edema. ; No orthopnea, PND Respiratory: Negative for cough, hemoptysis, wheezing and stridor. No pleuritic chestpain. Gastrointestinal: Negative for nausea, vomiting, diarrhea, constipation, abdominal pain, melena, blood in stool, hematemesis, jaundice and rectal bleeding.    Genitourinary: Negative for frequency, dysuria, incontinence,flank pain and hematuria; Musculoskeletal: Negative for back pain and neck pain. Negative for swelling and trauma.;  Skin: . Negative for pruritus, rash, abrasions, bruising and skin lesion.; ulcerations Neuro: Negative for headache, lightheadedness and neck stiffness. Negative for  weakness, altered level of consciousness , altered mental status, extremity weakness, burning feet, involuntary movement, seizure and syncope.  Psych: negative for anxiety, depression, insomnia, tearfulness, panic attacks, hallucinations, paranoia, suicidal or homicidal ideation    Past Medical History  Diagnosis Date  . AAA (abdominal aortic aneurysm) ([place])   . Hyperlipidemia     takes Atorvastatin daily  . CAD (coronary artery disease)   . Ulcer     gastric  . Aneurysm ([place])   . Anxiety     takes Xanax daily as needed  . Depression     takes Celexa daily  . Hypertension     takes Metoprolol and Benazepril  daily  . Peripheral edema     takes Furosemide daily  . Myocardial infarction ([place])     20+yrs ago  . COPD (chronic obstructive pulmonary disease) (HCC)     Albuterol inhaler and neb daily as needed;takes SPiriva daily  . History of shingles   . CHF (congestive heart failure) ([place])   . Cancer ([place])   . Shortness of breath      on oxygen  2 liters continuous  . Headache(784.0)     occasionally    Past Surgical History  Procedure Laterality Date  . Lipoma excision      right lower back  . Partial gastrectomy      approx 1990  . Hernia repair Left     inguinal  . Tonsillectomy      as a child  . Esophagogastroduodenoscopy    . Coronary artery bypass graft  20+yrs ago    x 6  . Abdominal aortic endovascular stent graft N/A 09/14/2014  Procedure: ABDOMINAL AORTIC ENDOVASCULAR STENT GRAFT;  Surgeon: Conrad Manchester, MD;  Location: [facility] OR;  Service: Vascular;  Laterality: N/A;    Medications:  HOME MEDS: Prior to Admission medications   Medication Sig Start Date End Date Taking? Authorizing Provider  albuterol (PROVENTIL) (2.5 MG/3ML) 0.083% nebulizer solution USE 1 VIAL IN NEBULIZER EVERY 4 HOURS AS NEEDED Patient taking differently: USE 1 VIAL IN NEBULIZER EVERY 4 HOURS AS NEEDED WHEEZING. 02/08/15  Yes [name], MD  ALPRAZolam Duanne Moron) 1 MG tablet Take 0.5-1  mg by mouth 3 (three) times daily as needed for anxiety (Takes one-half tablet in the morning and afternoon. takes one tablet at bedtime daily).   Yes Historical Provider, MD  aspirin 81 MG tablet Take 81 mg by mouth daily.   Yes Historical Provider, MD  atorvastatin (LIPITOR) 80 MG tablet TAKE 1 TABLET BY MOUTH DAILY. 03/08/15  Yes Arnoldo Lenis, MD  benazepril (LOTENSIN) 40 MG tablet TAKE 1 TABLET BY MOUTH DAILY. 04/25/15  Yes Lendon Colonel, NP  budesonide-formoterol [facility]) 160-4.5 MCG/ACT inhaler Inhale 2 puffs into the lungs 2 (two) times daily. 04/12/13  Yes Kathie Dike, MD  citalopram (CELEXA) 20 MG tablet 1/2 qd 11/30/14  Yes [name], MD  furosemide (LASIX) 40 MG tablet TAKE 1 TABLET BY MOUTH 2 TIMES DAILY. 04/29/15  Yes Lendon Colonel, NP  HYDROcodone-acetaminophen (NORCO) 10-325 MG tablet Take 1 tablet by mouth 2 (two) times daily as needed (pain). 03/16/15  Yes [name], MD  metoprolol succinate (TOPROL-XL) 25 MG 24 hr tablet Take 0.5 tablets (12.5 mg total) by mouth 2 (two) times daily. 04/13/14  Yes Lendon Colonel, NP  nitroGLYCERIN (NITROSTAT) 0.4 MG SL tablet Place 1 tablet (0.4 mg total) under the tongue every 5 (five) minutes as needed for chest pain. 10/27/13  Yes [name], MD  potassium chloride SA (K-DUR,KLOR-CON) 20 MEQ tablet TAKE 1 TABLET BY MOUTH DAILY. 04/11/15  Yes Arnoldo Lenis, MD  tamsulosin (FLOMAX) 0.4 MG CAPS capsule Take 1 capsule (0.4 mg total) by mouth daily. Patient taking differently: Take 0.4 mg by mouth daily as needed (slow urination).  09/15/14  Yes Samantha J Rhyne, PA-C  tiotropium (SPIRIVA HANDIHALER) 18 MCG inhalation capsule PLACE 1 CAPSULE INTO INHALER AND INHALE DAILY 03/16/15  Yes Scott A Luking, MD  VENTOLIN HFA 108 (90 BASE) MCG/ACT inhaler INHALE 2 PUFFS BY MOUTH EVERY 4-6 HOURS AS NEEDED 11/24/14  Yes [name], MD  benazepril (LOTENSIN) 20 MG tablet Take 1 tablet (20 mg total) by mouth daily. Patient not  taking: Reported on 05/02/2015 11/30/14   [name], MD  UNABLE TO FIND Place 2 L into the nose continuous. Oxygen 2 liters.    Historical Provider, MD     Allergies:  No Known Allergies  Social History:   reports that he has been smoking Cigarettes.  He has a 14.5 pack-year smoking history. He has quit using smokeless tobacco. He reports that he does not drink alcohol or use illicit drugs.  Family History: Family History  Problem Relation Age of Onset  . Hypertension Brother   . Heart disease Brother     Heart Disease before age 58  . Heart attack Brother   . Cancer Brother   . Cancer Mother   . Diabetes Mother   . Heart disease Mother     Heart Disease before age 19  . Hypertension Mother   . Heart attack Mother   .  Cancer Father     prostate  . Hypertension Father   . COPD Sister   . Diabetes Sister   . Heart disease Sister   . Hypertension Sister   . Heart attack Sister   . Colon cancer Neg Hx      Physical Exam: Filed Vitals:   05/02/15 1130 05/02/15 1200 05/02/15 1201 05/02/15 1230  BP: 120/69 104/75  122/62  Pulse: 75 98  98  Temp:      TempSrc:      Resp: 23 34  26  Weight:      SpO2: 93% 93% 95% 91%   Blood pressure 122/62, pulse 98, temperature 97.9 F (36.6 C), temperature source Oral, resp. rate 26, weight 61.236 kg (135 lb), SpO2 91 %.  GEN:  Pleasant  patient lying in the stretcher in no acute distress; cooperative with exam. PSYCH:  alert and oriented x4; does not appear anxious or depressed; affect is appropriate. HEENT: Mucous membranes pink and anicteric; PERRLA; EOM intact; no cervical lymphadenopathy nor thyromegaly or carotid bruit; no JVD; There were no stridor. Neck is very supple. Breasts:: Not examined CHEST WALL: No tenderness CHEST: Normal respiration, He has some rales, and decrease BS with tight wheezing.  HEART: Regular rate and rhythm.  There are no murmur, rub, or gallops.   BACK: No kyphosis or scoliosis; no CVA  tenderness ABDOMEN: soft and non-tender; no masses, no organomegaly, normal abdominal bowel sounds; no pannus; no intertriginous candida. There is no rebound and no distention. Rectal Exam: Not done EXTREMITIES: No bone or joint deformity; age-appropriate arthropathy of the hands and knees; no edema; no ulcerations.  There is no calf tenderness. Genitalia: not examined PULSES: 2+ and symmetric SKIN: Normal hydration no rash or ulceration CNS: Cranial nerves 2-12 grossly intact no focal lateralizing neurologic deficit.  Speech is fluent; uvula elevated with phonation, facial symmetry and tongue midline. DTR are normal bilaterally, cerebella exam is intact, barbinski is negative and strengths are equaled bilaterally.  No sensory loss.   Labs on Admission:  Basic Metabolic Panel:  Recent Labs Lab 05/02/15 0845  NA 137  K 4.1  CL 88*  CO2 39*  GLUCOSE 152*  BUN 15  CREATININE 0.58*  CALCIUM 9.5   CBC:  Recent Labs Lab 05/02/15 0845  WBC 10.1  NEUTROABS 8.9*  HGB 14.9  HCT 46.7  MCV 100.2*  PLT 106*   Cardiac Enzymes:  Recent Labs Lab 05/02/15 0845  TROPONINI 0.03   Radiological Exams on Admission: Dg Chest 2 View  05/02/2015  CLINICAL DATA:  Worsening shortness of breath for the last month. History of COPD and cancer. EXAM: CHEST  2 VIEW COMPARISON:  09/14/2014 and 01/19/2014 radiographs. Abdominal CT 08/13/2014 and 10/21/2014. Chest CT 02/24/2013. FINDINGS: The heart size and mediastinal contours are stable status post CABG. The lungs are chronically hyperinflated with generalized interstitial prominence, unchanged from priors. There is chronic blunting of costophrenic angles. No airspace disease, definite edema or significant pleural effusion. The bones appear unchanged. IMPRESSION: Stable findings of severe chronic obstructive pulmonary disease. No acute process identified. Electronically Signed   By: Richardean Sale M.D.   On: 05/02/2015 09:28    EKG: Independently  reviewed.   Assessment/Plan  COPD Exacerbation: Will continue with IV steroids and neb.  Will add an antibiotic as evidence showed improvement with inpatient Tx of COPD.   CAD:  Stable, will continue with his meds.  CHF:  Will give him IV Lasix.  Follow electrolytes and renal  Fx.   Tobacco abuse:  Discussed.   Continue with nicotine patch.   Other plans as per orders.  Code Status: FULL CODE.  ( reconfirmed today)   Orvan Falconer, MD. FACP [facility] Pager [phone] 7pm to 7am.  05/02/2015, 1:32 PM

## 2015-05-03 ENCOUNTER — Inpatient Hospital Stay (HOSPITAL_COMMUNITY): Payer: 59

## 2015-05-03 DIAGNOSIS — J9612 Chronic respiratory failure with hypercapnia: Secondary | ICD-10-CM

## 2015-05-03 DIAGNOSIS — I5022 Chronic systolic (congestive) heart failure: Secondary | ICD-10-CM | POA: Diagnosis present

## 2015-05-03 DIAGNOSIS — J9611 Chronic respiratory failure with hypoxia: Secondary | ICD-10-CM | POA: Diagnosis present

## 2015-05-03 DIAGNOSIS — G894 Chronic pain syndrome: Secondary | ICD-10-CM

## 2015-05-03 DIAGNOSIS — I509 Heart failure, unspecified: Secondary | ICD-10-CM

## 2015-05-03 DIAGNOSIS — I714 Abdominal aortic aneurysm, without rupture: Secondary | ICD-10-CM

## 2015-05-03 LAB — CBC
HCT: 45.9 % (ref 39.0–52.0)
HEMOGLOBIN: 14.3 g/dL (ref 13.0–17.0)
MCH: 31.3 pg (ref 26.0–34.0)
MCHC: 31.2 g/dL (ref 30.0–36.0)
MCV: 100.4 fL — ABNORMAL HIGH (ref 78.0–100.0)
PLATELETS: 111 10*3/uL — AB (ref 150–400)
RBC: 4.57 MIL/uL (ref 4.22–5.81)
RDW: 13.3 % (ref 11.5–15.5)
WBC: 14.6 10*3/uL — ABNORMAL HIGH (ref 4.0–10.5)

## 2015-05-03 LAB — BASIC METABOLIC PANEL
Anion gap: 6 (ref 5–15)
BUN: 16 mg/dL (ref 6–20)
CALCIUM: 9.4 mg/dL (ref 8.9–10.3)
CO2: 45 mmol/L — AB (ref 22–32)
CREATININE: 0.41 mg/dL — AB (ref 0.61–1.24)
Chloride: 88 mmol/L — ABNORMAL LOW (ref 101–111)
GFR calc Af Amer: >60 mL/min (ref >60)
GFR calc non Af Amer: >60 mL/min (ref >60)
GLUCOSE: 151 mg/dL — AB (ref 65–99)
Potassium: 3.6 mmol/L (ref 3.5–5.1)
Sodium: 139 mmol/L (ref 135–145)

## 2015-05-03 MED ORDER — HYDROCODONE-ACETAMINOPHEN 5-325 MG PO TABS
1.0000 | ORAL_TABLET | Freq: Four times a day (QID) | ORAL | Status: DC | PRN
  Administered 2015-05-03: 1 via ORAL
  Filled 2015-05-03: qty 1

## 2015-05-03 NOTE — Progress Notes (Addendum)
Initial Nutrition Assessment  DOCUMENTATION CODES:  Severe malnutrition in context of chronic illness  INTERVENTION:  Magic cup TID with meals, each supplement provides 290 kcal and 9 grams of protein  Pt with petechiae. Though unlikely, Given his reportedly minimal intake of only a few bites a day, could be nutritionally related-Vit C. Recommend MVI w/ min  Snacks in between meals  Recommended discussion between MD and family on appetite stimulant.   NUTRITION DIAGNOSIS:  Malnutrition related to catabolic illness as evidenced by severe depletion of body fat/muscle, loss of 13% bw in 6 months, and an estimated energy intake that met < or equal to 75% of needs for > or equal to 1 month.  GOAL:  Patient will meet greater than or equal to 90% of their needs  MONITOR:  PO intake, Supplement acceptance, Weight trends, Skin, I & O's  REASON FOR ASSESSMENT:  Malnutrition Screening Tool    ASSESSMENT:  73 y/o male PMHx Severe COPD, AAA, CAD s/p CABG, CHF, anxiety, cancer, HTN who presents with worsening SOB.   Pt was difficult to understand, mostly spoke to family member. She reports that pt just does not have an appetite. Pt himself reported that he only eats " a few bites" each day. There is no associated n/v/c/d or other symptoms with his loss of appetite.  Pt does not have any teeth, but family member states that does not impact him from eating what he wants.   RD asked/reccomended some sort of supplement. Family member states that pt does not like/will not accept Ensure/Boost. RD listed high Kcal/pro foods. Pt likes these, but just doesn't have an appetite. Pt also does not take any vitamins/mineral supplements.   Patient has lost a significant amount of weight in the past 6 months (13%). Weight loss likely from combination of very high energy expenditure in catabolic illness and his very minimal PO intake.   It sounds like pt's number one problem is his lack of appetite. Briefly  discussed appetite stimulants with family member. Asked family member to talk to MD about them and weigh risks vs potential benefits.  Pt has not tried [employer]. Will offer. Will try PB in between meals as well.  RD directly asked pt if there is anything he wanted at all and pt said "no".   NFPE: Pt is cachetic looking. Severe upper body fat/muscle loss and moderate lower body muscle loss. Has some mild edema on ankles.   Diet Order:  Diet Heart Room service appropriate?: Yes; Fluid consistency:: Thin  Skin:  Dry, excoriated, ecchymotic, petechiae   Last BM:  Unknown  Height:  Ht Readings from Last 1 Encounters:  05/02/15 '5\' 7"'  (1.702 m)   Weight:  Wt Readings from Last 1 Encounters:  05/02/15 134 lb 7.7 oz (61 kg)   Wt Readings from Last 10 Encounters:  05/02/15 134 lb 7.7 oz (61 kg)  03/16/15 141 lb (63.957 kg)  11/30/14 144 lb (65.318 kg)  10/21/14 154 lb (69.854 kg)  09/14/14 157 lb (71.215 kg)  09/10/14 157 lb 3 oz (71.3 kg)  08/27/14 151 lb 8 oz (68.72 kg)  07/16/14 156 lb (70.761 kg)  06/03/14 156 lb (70.761 kg)  04/13/14 155 lb (70.308 kg)   Ideal Body Weight:  67.27 kg  BMI:  Body mass index is 21.06 kg/(m^2).  Estimated Nutritional Needs:  Kcal:  1650-1850 kcals (27-30 kcal/kg) Protein:  67-79 g (1.1-1.3/kg bw) Fluid:  1.7-1.9 liters  EDUCATION NEEDS:  No education needs identified  at this time  Burtis Junes RD, LDN Nutrition Pager: [phone] 05/03/2015 1:50 PM

## 2015-05-03 NOTE — Progress Notes (Signed)
[facility] PROGRESS NOTE   Tommy Turner [id number] DOB: 1940-05-25 DOA: 05/02/2015 PCP: Sallee Lange, MD  HPI/Subjective: Feels okay, denies any new complaints. Currently on 4 L of oxygen.  Assessment/Plan: Principal Problem:   Acute on chronic respiratory failure with hypoxia (HCC) Active Problems:   Abdominal aortic aneurysm (HCC)   COPD exacerbation (HCC)   Chronic pain syndrome   Dyspnea   Chronic systolic CHF (congestive heart failure) (HCC)    Acute on chronic respiratory failure with hypoxia Patient has chronic respiratory failure, he is on 2 L of oxygen all the time at home. Presented with hypoxia with oxygen saturation of 87% on 2 L of oxygen.  This is could be secondary to COPD exacerbation.  Provide supplemental oxygen, treat underlying cause.  COPD exacerbation He still smokes , presented with SOB, cough , wheezing and sputum production.  Chest x-ray did not show infiltrates or suggesting pneumonia. Started on steroids and IV antibiotics. Supportive management with bronchodilators, mucolytics,  And antitussives.  Chronic systolic CHF Patient has low  LVEF of 25-30% from 2-D echo on August 2015. Repeat 2-D echo, keep intake and output on the negative side, no signs of fluid fluid overload for now.   CAD and AAA Stable and chronic conditions, continue home medications.   Tobacco abuse  Counseled extensively about tobacco cessation.   Code Status: Full Code Family Communication: Plan discussed with the patient. Disposition Plan: Remains inpatient Diet: Diet Heart Room service appropriate?: Yes; Fluid consistency:: Thin  Consultants:  None  Procedures:  None  Antibiotics:  None   Objective: Filed Vitals:   05/02/15 1431 05/03/15 0510  BP: 127/68 106/63  Pulse: 95 83  Temp: 98.5 F (36.9 C) 98 F (36.7 C)  Resp: 20 21    Intake/Output Summary (Last 24 hours) at 05/03/15 1106 Last data filed at 05/03/15 0850  Gross per  24 hour  Intake    390 ml  Output    500 ml  Net   -110 ml   Filed Weights   05/02/15 0824 05/02/15 1410  Weight: 61.236 kg (135 lb) 61 kg (134 lb 7.7 oz)    Exam: General: Alert and awake, oriented x3, not in any acute distress. HEENT: anicteric sclera, pupils reactive to light and accommodation, EOMI CVS: S1-S2 clear, no murmur rubs or gallops Chest: clear to auscultation bilaterally, no wheezing, rales or rhonchi Abdomen: soft nontender, nondistended, normal bowel sounds, no organomegaly Extremities: no cyanosis, clubbing or edema noted bilaterally Neuro: Cranial nerves II-XII intact, no focal neurological deficits  Data Reviewed: Basic Metabolic Panel:  Recent Labs Lab 05/02/15 0845 05/03/15 0637  NA 137 139  K 4.1 3.6  CL 88* 88*  CO2 39* 45*  GLUCOSE 152* 151*  BUN 15 16  CREATININE 0.58* 0.41*  CALCIUM 9.5 9.4   Liver Function Tests: No results for input(s): AST, ALT, ALKPHOS, BILITOT, PROT, ALBUMIN in the last 168 hours. No results for input(s): LIPASE, AMYLASE in the last 168 hours. No results for input(s): AMMONIA in the last 168 hours. CBC:  Recent Labs Lab 05/02/15 0845 05/03/15 0637  WBC 10.1 14.6*  NEUTROABS 8.9*  --   HGB 14.9 14.3  HCT 46.7 45.9  MCV 100.2* 100.4*  PLT 106* 111*   Cardiac Enzymes:  Recent Labs Lab 05/02/15 0845  TROPONINI 0.03   BNP (last 3 results)  Recent Labs  05/02/15 0845  BNP 580.0*    ProBNP (last 3 results) No results for input(s): PROBNP in the  last 8760 hours.  CBG: No results for input(s): GLUCAP in the last 168 hours.  Micro No results found for this or any previous visit (from the past 240 hour(s)).   Studies: Dg Chest 2 View  05/02/2015  CLINICAL DATA:  Worsening shortness of breath for the last month. History of COPD and cancer. EXAM: CHEST  2 VIEW COMPARISON:  09/14/2014 and 01/19/2014 radiographs. Abdominal CT 08/13/2014 and 10/21/2014. Chest CT 02/24/2013. FINDINGS: The heart size and  mediastinal contours are stable status post CABG. The lungs are chronically hyperinflated with generalized interstitial prominence, unchanged from priors. There is chronic blunting of costophrenic angles. No airspace disease, definite edema or significant pleural effusion. The bones appear unchanged. IMPRESSION: Stable findings of severe chronic obstructive pulmonary disease. No acute process identified. Electronically Signed   By: Richardean Sale M.D.   On: 05/02/2015 09:28    Scheduled Meds: . albuterol  2.5 mg Nebulization Q6H  . antiseptic oral rinse  7 mL Mouth Rinse BID  . aspirin EC  81 mg Oral Daily  . atorvastatin  80 mg Oral Daily  . benazepril  40 mg Oral Daily  . budesonide-formoterol  2 puff Inhalation BID  . citalopram  20 mg Oral Daily  . enoxaparin (LOVENOX) injection  40 mg Subcutaneous Q24H  . feeding supplement (ENSURE ENLIVE)  237 mL Oral BID BM  . furosemide  40 mg Intravenous BID  . levofloxacin (LEVAQUIN) IV  750 mg Intravenous Q24H  . methylPREDNISolone (SOLU-MEDROL) injection  40 mg Intravenous Q6H  . metoprolol succinate  12.5 mg Oral BID  . potassium chloride SA  20 mEq Oral Daily  . sodium chloride  3 mL Intravenous Q12H  . tiotropium  18 mcg Inhalation Daily   Continuous Infusions:      Time spent: 35 minutes    [facility]  [facility] Pager [phone] If 7PM-7AM, please contact night-coverage at [email or web address], password [facility] 05/03/2015, 11:06 AM  LOS: 1 day

## 2015-05-03 NOTE — Progress Notes (Signed)
Patient has petachie rash to BUE and back. Dr. Hartford Poli notified

## 2015-05-03 NOTE — Care Management Note (Signed)
Case Management Note  Patient Details  Name: Tommy Turner MRN: [id number] Date of Birth: 09/15/1940  Subjective/Objective:                  Pt admitted from home with COPD exacerbation. Pt lives with his wife and will return home at discharge. Pt has a cane for home use along with neb machine. Pt has home O2 with [facility]. Pt is fairly independent with ADL's.  Action/Plan: No CM needs noted.  Expected Discharge Date:                  Expected Discharge Plan:  Home/Self Care  In-House Referral:  NA  Discharge planning Services  CM Consult  Post Acute Care Choice:  NA Choice offered to:  NA  DME Arranged:    DME Agency:     HH Arranged:    HH Agency:     Status of Service:  Completed, signed off  Medicare Important Message Given:    Date Medicare IM Given:    Medicare IM give by:    Date Additional Medicare IM Given:    Additional Medicare Important Message give by:     If discussed at [place] of Stay Meetings, dates discussed:    Additional Comments:  Joylene Draft, RN 05/03/2015, 11:13 AM

## 2015-05-04 DIAGNOSIS — R0689 Other abnormalities of breathing: Secondary | ICD-10-CM

## 2015-05-04 DIAGNOSIS — J9622 Acute and chronic respiratory failure with hypercapnia: Secondary | ICD-10-CM

## 2015-05-04 DIAGNOSIS — G9340 Encephalopathy, unspecified: Secondary | ICD-10-CM

## 2015-05-04 DIAGNOSIS — E43 Unspecified severe protein-calorie malnutrition: Secondary | ICD-10-CM | POA: Insufficient documentation

## 2015-05-04 LAB — CBC
HEMATOCRIT: 49.2 % (ref 39.0–52.0)
HEMOGLOBIN: 14.7 g/dL (ref 13.0–17.0)
MCH: 31.1 pg (ref 26.0–34.0)
MCHC: 29.9 g/dL — ABNORMAL LOW (ref 30.0–36.0)
MCV: 104.2 fL — AB (ref 78.0–100.0)
Platelets: 130 10*3/uL — ABNORMAL LOW (ref 150–400)
RBC: 4.72 MIL/uL (ref 4.22–5.81)
RDW: 13.4 % (ref 11.5–15.5)
WBC: 12.9 10*3/uL — AB (ref 4.0–10.5)

## 2015-05-04 LAB — BASIC METABOLIC PANEL
ANION GAP: 3 — AB (ref 5–15)
BUN: 32 mg/dL — ABNORMAL HIGH (ref 6–20)
CHLORIDE: 89 mmol/L — AB (ref 101–111)
CO2: 49 mmol/L — AB (ref 22–32)
Calcium: 9.8 mg/dL (ref 8.9–10.3)
Creatinine, Ser: 0.61 mg/dL (ref 0.61–1.24)
GFR calc Af Amer: >60 mL/min (ref >60)
GLUCOSE: 165 mg/dL — AB (ref 65–99)
POTASSIUM: 4.3 mmol/L (ref 3.5–5.1)
Sodium: 141 mmol/L (ref 135–145)

## 2015-05-04 LAB — BLOOD GAS, ARTERIAL
Acid-Base Excess: 21.7 mmol/L — ABNORMAL HIGH (ref 0.0–2.0)
Acid-Base Excess: 22.4 mmol/L — ABNORMAL HIGH (ref 0.0–2.0)
Bicarbonate: 39 mEq/L — ABNORMAL HIGH (ref 20.0–24.0)
Bicarbonate: 43.8 mEq/L — ABNORMAL HIGH (ref 20.0–24.0)
DRAWN BY: 234301
Delivery systems: POSITIVE
Drawn by: 23534
Expiratory PAP: 6
FIO2: 0.3
Inspiratory PAP: 20
MODE: POSITIVE
O2 Content: 8 L/min
O2 Content: 30 L/min
O2 Saturation: 97.3 %
O2 Saturation: 87.7 %
PCO2 ART: 72.2 mmHg — AB (ref 35.0–45.0)
PH ART: 7.168 — AB (ref 7.350–7.450)
PH ART: 7.442 (ref 7.350–7.450)
PO2 ART: 53.6 mmHg — AB (ref 80.0–100.0)
pCO2 arterial: 146 mmHg (ref 35.0–45.0)
pO2, Arterial: 167 mmHg — ABNORMAL HIGH (ref 80.0–100.0)

## 2015-05-04 LAB — URINALYSIS, ROUTINE W REFLEX MICROSCOPIC
Bilirubin Urine: NEGATIVE
Glucose, UA: NEGATIVE mg/dL
Hgb urine dipstick: NEGATIVE
KETONES UR: NEGATIVE mg/dL
LEUKOCYTES UA: NEGATIVE
NITRITE: NEGATIVE
PH: 6 (ref 5.0–8.0)
Protein, ur: NEGATIVE mg/dL

## 2015-05-04 LAB — AMMONIA: Ammonia: 102 umol/L — ABNORMAL HIGH (ref 9–35)

## 2015-05-04 LAB — MRSA PCR SCREENING: MRSA by PCR: NEGATIVE

## 2015-05-04 MED ORDER — NICOTINE 7 MG/24HR TD PT24
7.0000 mg | MEDICATED_PATCH | Freq: Every day | TRANSDERMAL | Status: DC
  Administered 2015-05-04 – 2015-05-08 (×5): 7 mg via TRANSDERMAL
  Filled 2015-05-04 (×9): qty 1

## 2015-05-04 NOTE — Progress Notes (Signed)
PT Cancellation Note  Patient Details Name: Tommy Turner MRN: [id number] DOB: 09-01-1940   Cancelled Treatment:    Reason Eval/Treat Not Completed: Medical issues which prohibited therapy.  Pt transferred to ICU.  We will need to d/c PT orders.  Please reconsult when medically appropriate.   Demetrios Isaacs L  PT 05/04/2015, 10:31 AM [phone]

## 2015-05-04 NOTE — Progress Notes (Signed)
Critical lab CO2 146 P02 167. Dr. Clementeen Graham notified. New orders to transfer stepdown. Report called to Eulis Canner, RN. Patient transferred to ICU 8.

## 2015-05-04 NOTE — Progress Notes (Signed)
CRITICAL VALUE ALERT  Critical value received:  pCo2 72.2 Date of notification:  05/04/2015   Time of notification:  1620   Critical value read back:Yes.    Nurse who received alert:  Eulis Canner schonewitz, rn   MD notified (1st page):  Luan Pulling  Time of first page:  1622   MD notified (2nd page):  Time of second page:  Responding MD:  Luan Pulling  Time MD responded:  [phone]

## 2015-05-04 NOTE — Consult Note (Signed)
Full note to follow. He has acute respiratory failure.  Seems to be responding to BIPAP> recheck ABG at 1500

## 2015-05-04 NOTE — Progress Notes (Addendum)
[facility] PROGRESS NOTE  Tommy Turner [id number] DOB: 25-Aug-1940 DOA: 05/02/2015 PCP: Sallee Lange, MD  Brief narrative 74 year old male with severe COPD on 2 L home O2, ongoing tobacco use (4-5 cigarettes a day), AAA with recent repair, CAD status post CABG, history of CHF with EF of 25%, anxiety and hypertension brought to the ED with shortness of breath for past 2 days. In the ED he appeared to have acute COPD exacerbation. Chest x-ray was unremarkable for acute infiltrate or congestion. Patient given IV Lasix, IV Solu-Medrol and admitted to hospitalist service for COPD exacerbation. On 12/28 patient increasingly lethargic and somnolent. ABG done showing severe hypercapnic respiratory failure Transferred to stepdown on BiPAP. Pulmonary consulted.  Assessment/Plan: Acute on chronic hypercapnic respiratory failure Secondary to COPD exacerbation and now with CO2 narcosis. ABG shows respiratory acidosis with severe hypercapnia. Patient somnolent but arousable and answering a few questions. Ordered for stat BiPAP and transferred patient to stepdown unit.  consulted pulmonary. If remains somnolent and lethargic on BiPAP he will require intubation. Keep NPO. -Continue neuro checks. Continue Solu-Medrol. I will place him on scheduled nebs tolerated. Continue Levaquin. -Continue pain medications and benzodiazepines. Check labs and ammonia level. -monitor ABG on BiPAP.  Chronic systolic CHF 2-D echo repeated showing EF of 25%. Does not appear volume overloaded. Continue aspirin, statin, beta blocker, Lasix and ACE inhibitor. Monitor I/O.  Ongoing tobacco use Discussed with wife on cessation. Ordered nicotine patch.  Severe protein calorie malnutrition Added supplement.  CAD with history of AAA Stable. Continue home medications.  DVT prophylaxis: Subcutaneous Lovenox Diet: Nothing by mouth  Code Status: FULL Code Family Communication: wife at bedside Disposition Plan:treansfer  to stepdown unit   Consultants:  pulmonary    Procedures:  None  Antibiotics:  Levaquin  HPI/Subjective: seen and examined. Patient is somnolent since this morning. Arousable and answering a few question.  Objective: Filed Vitals:   05/03/15 2152 05/04/15 0703  BP: 94/51 127/93  Pulse: 86 102  Temp: 98.9 F (37.2 C) 98.5 F (36.9 C)  Resp: 20 20    Intake/Output Summary (Last 24 hours) at 05/04/15 0955 Last data filed at 05/04/15 0900  Gross per 24 hour  Intake    540 ml  Output      1 ml  Net    539 ml   Filed Weights   05/02/15 0824 05/02/15 1410  Weight: 61.236 kg (135 lb) 61 kg (134 lb 7.7 oz)    Exam:   General:  Elderly thin built male lying in bed somnolent arousable to commands  HEENT: Temporal wasting, no pallor, moist mucosa, supple neck   chest: Scattered rhonchi bilaterally  Cardiovascular: S1 and S2, no murmurs  GI: Soft, nondistended, nontender  Musculoskeletal: warm, no edema   CNS: Somnolent but arousable to commands, oriented to place and able to answer a few questions before falling back asleep. Flapping tremors.   Data Reviewed: Basic Metabolic Panel:  Recent Labs Lab 05/02/15 0845 05/03/15 0637  NA 137 139  K 4.1 3.6  CL 88* 88*  CO2 39* 45*  GLUCOSE 152* 151*  BUN 15 16  CREATININE 0.58* 0.41*  CALCIUM 9.5 9.4   Liver Function Tests: No results for input(s): AST, ALT, ALKPHOS, BILITOT, PROT, ALBUMIN in the last 168 hours. No results for input(s): LIPASE, AMYLASE in the last 168 hours. No results for input(s): AMMONIA in the last 168 hours. CBC:  Recent Labs Lab 05/02/15 0845 05/03/15 0637  WBC 10.1 14.6*  NEUTROABS  8.9*  --   HGB 14.9 14.3  HCT 46.7 45.9  MCV 100.2* 100.4*  PLT 106* 111*   Cardiac Enzymes:  Recent Labs Lab 05/02/15 0845  TROPONINI 0.03   BNP (last 3 results)  Recent Labs  05/02/15 0845  BNP 580.0*    ProBNP (last 3 results) No results for input(s): PROBNP in the last 8760  hours.  CBG: No results for input(s): GLUCAP in the last 168 hours.  No results found for this or any previous visit (from the past 240 hour(s)).   Studies: No results found.  Scheduled Meds: . albuterol  2.5 mg Nebulization Q6H  . antiseptic oral rinse  7 mL Mouth Rinse BID  . aspirin EC  81 mg Oral Daily  . atorvastatin  80 mg Oral Daily  . benazepril  40 mg Oral Daily  . budesonide-formoterol  2 puff Inhalation BID  . citalopram  20 mg Oral Daily  . enoxaparin (LOVENOX) injection  40 mg Subcutaneous Q24H  . furosemide  40 mg Intravenous BID  . levofloxacin (LEVAQUIN) IV  750 mg Intravenous Q24H  . methylPREDNISolone (SOLU-MEDROL) injection  40 mg Intravenous Q6H  . metoprolol succinate  12.5 mg Oral BID  . potassium chloride SA  20 mEq Oral Daily  . sodium chloride  3 mL Intravenous Q12H  . tiotropium  18 mcg Inhalation Daily   Continuous Infusions:     Time spent: 35 minutes    Tommy Turner, [place]  [facility] Pager [phone] If 7PM-7AM, please contact night-coverage at [email or web address], password [facility] 05/04/2015, 9:55 AM  LOS: 2 days

## 2015-05-05 LAB — BLOOD GAS, ARTERIAL
ACID-BASE EXCESS: 21.7 mmol/L — AB (ref 0.0–2.0)
ACID-BASE EXCESS: 24.3 mmol/L — AB (ref 0.0–2.0)
BICARBONATE: 45.5 meq/L — AB (ref 20.0–24.0)
Bicarbonate: 43.3 mEq/L — ABNORMAL HIGH (ref 20.0–24.0)
DRAWN BY: 317771
DRAWN BY: 23534
Delivery systems: POSITIVE
Expiratory PAP: 6
FIO2: 0.3
INSPIRATORY PAP: 20
O2 CONTENT: 2 L/min
O2 SAT: 84.2 %
O2 Saturation: 88.5 %
PO2 ART: 58 mmHg — AB (ref 80.0–100.0)
PO2 ART: 51.7 mmHg — AB (ref 80.0–100.0)
TCO2: 17.6 mmol/L (ref 0–100)
pCO2 arterial: 68.4 mmHg (ref 35.0–45.0)
pCO2 arterial: 73.1 mmHg (ref 35.0–45.0)
pH, Arterial: 7.456 — ABNORMAL HIGH (ref 7.350–7.450)
pH, Arterial: 7.454 — ABNORMAL HIGH (ref 7.350–7.450)

## 2015-05-05 NOTE — Clinical Documentation Improvement (Signed)
Internal Medicine  Abnormal Lab/Test Results:   Component      Platelets  Latest Ref Rng      150 - 400 K/uL  05/02/2015      106 (L)  05/03/2015      111 (L)  05/04/2015     9:44 AM 130 (L)    Possible Clinical Conditions associated with below indicators  Thrombocytopenia  Other Condition  Cannot Clinically Determine   Please exercise your independent, professional judgment when responding. A specific answer is not anticipated or expected.   Thank you, Mateo Flow, RN [phone] Clinical Documentation Specialist

## 2015-05-05 NOTE — Consult Note (Signed)
[name], Turner               ACCOUNT NO.:  [id number]  MEDICAL RECORD NO.:  [id number]  LOCATION:  IC08                          FACILITY:  [facility]  PHYSICIAN:  Andersyn Fragoso L. Luan Pulling, M.D.DATE OF BIRTH:  10/20/1940  DATE OF CONSULTATION:  05/04/2015 DATE OF DISCHARGE:                                CONSULTATION   HISTORY OF PRESENT ILLNESS:  Tommy Turner is a 74 year old, who is known to have severe COPD, chronic hypoxic respiratory failure with continued tobacco abuse, history of recent abdominal aortic aneurysm repair, history of coronary artery occlusive disease status post coronary artery bypass grafting, and history of congestive heart failure.  He had come to the emergency department with increasing shortness of breath and was being treated for COPD exacerbation.  He initially had done relatively well, but then on the morning of December, 28, 2016, he was found to be very somnolent and his pCO2 was greater than 100 with pH around 7.1.  He was transferred to the intensive care unit, placed on BiPAP and Pulmonary consultation was requested.  PAST MEDICAL HISTORY:  Complicated as mentioned.  He has cardiac disease, COPD, heart failure, abdominal aortic aneurysm, history of peptic ulcer disease, chronic hypoxic respiratory failure, has a history of partial gastrectomy, coronary artery bypass grafting, abdominal aortic endovascular stent graft done about 7 months ago.  I reviewed home medications and have reviewed his medications in the hospital.  SOCIAL HISTORY:  Patient has continued to smoke.  He has an approximately 75 pack year smoking history.  He does not use any alcohol or illicit drugs.  FAMILY HISTORY:  Positive for heart disease in multiple family members, diabetes in multiple family members, cancer in multiple family members.  REVIEW OF SYSTEMS:  Really not obtainable with him on the BiPAP.  PHYSICAL EXAMINATION:  GENERAL:  He is on BiPAP, but responsive and able to  answer some questions yes or no by nodding his head.  There is family in the room with him. HEENT:  His pupils are reactive.  Nose and throat are clear.  Mucous membranes are moist. NECK:  Supple without masses. CHEST:  Diminished breath sounds, prolonged expiratory phase and end- expiratory wheezes. HEART:  Regular without gallop. ABDOMEN:  Soft.  No masses are felt.  Surgical scar looks okay. EXTREMITIES:  Without edema. CENTRAL NERVOUS SYSTEM:  Grossly intact.  LABORATORY WORK:  pH 7.16, pCO2 of 146, pO2 of 167.  His electrolytes are essentially normal.  His CO2 was 49 on his basic metabolic profile with concomitant reduction of chloride.  White blood count is 12,900. Hemoglobin 14.7.  His chest x-ray on admission, severe COPD, but no acute process.  ASSESSMENT:  He has acute-on-chronic respiratory failure with hypercapnia.  He seems to be responding at this point to BiPAP, but he is high risk for needing to be intubated and placed on mechanical ventilation.  As discussed by telephone, I would go ahead and repeat his blood gas later today.  Continue with all of his other treatments including antibiotics, steroids, inhaled bronchodilators.  I discussed the possibility of intubation and mechanical ventilation with family and they agreed to do that if necessary.  Thanks for allowing me  to see him with you.     Tommy Turner L. Luan Pulling, M.D.     ELH/MEDQ  D:  05/05/2015  T:  05/05/2015  Job:  [id number]

## 2015-05-05 NOTE — Progress Notes (Addendum)
[facility] PROGRESS NOTE  Tommy Turner [id number] DOB: Sep 01, 1940 DOA: 05/02/2015 PCP: Sallee Lange, MD  Brief narrative 74 year old male with severe COPD on 2 L home O2, ongoing tobacco use (4-5 cigarettes a day), AAA with recent repair, CAD status post CABG, history of CHF with EF of 25%, anxiety and hypertension brought to the ED with shortness of breath for past 2 days. In the ED he appeared to have acute COPD exacerbation. Chest x-ray was unremarkable for acute infiltrate or congestion. Patient given IV Lasix, IV Solu-Medrol and admitted to hospitalist service for COPD exacerbation. On 12/28 patient increasingly lethargic and somnolent. ABG done showing severe hypercapnic respiratory failure Transferred to stepdown on BiPAP. Pulmonary consulted.  Assessment/Plan: Acute on chronic hypercapnic respiratory failure Secondary to COPD exacerbation and now with CO2 narcosis. ABG shows respiratory acidosis with severe hypercapnia. (PCO2 of 144!!).  Patient was somnolent on 12/28 and was transferred to ICU on BiPAP. Improved Activity will be BiPAP PCO2 trending down. He is alert and oriented -Continue Solu-Medrol scheduled nebs and empiric antibiotic. -Appreciate pulmonary recommendations. Will wean him off BiPAP today..    Chronic systolic CHF 2-D echo repeated showing EF of 25%. Does not appear volume overloaded. Continue aspirin, statin, beta blocker, Lasix and ACE inhibitor. Monitor I/O.  Ongoing tobacco use Discussed  on cessation. Wishes to quit. Continue nicotine patch.  Severe protein calorie malnutrition Added supplement.  CAD with history of AAA Stable. Continue home medications.  Chronic thrombocytopenia Stable. Monitor    DVT prophylaxis: Subcutaneous Lovenox Diet: Nothing by mouth  Code Status: FULL Code Family Communication: wife at bedside Disposition Plan: Continue step down  monitoring   Consultants:  pulmonary    Procedures:  None  Antibiotics:  Levaquin  HPI/Subjective: seen and examined. Patient tolerated BiPAP well overnight and subsequent ABG showing improved PCO2. Patient alert and awake. No overnight issues.  Objective: Filed Vitals:   05/05/15 0900 05/05/15 1100  BP: 126/78 134/101  Pulse: 92   Temp:  98 F (36.7 C)  Resp: 23 24    Intake/Output Summary (Last 24 hours) at 05/05/15 1259 Last data filed at 05/05/15 0500  Gross per 24 hour  Intake      0 ml  Output   1700 ml  Net  -1700 ml   Filed Weights   05/02/15 0824 05/02/15 1410 05/05/15 0500  Weight: 61.236 kg (135 lb) 61 kg (134 lb 7.7 oz) 56.9 kg (125 lb 7.1 oz)    Exam:   General:  Elderly thin built male lying in bed on BiPAP, alert and awake  HEENT: On BiPAP supple neck   chest: Diffuse wheezing bilaterally  Cardiovascular: Normal S1 and S2, no murmurs  GI: Soft, nondistended, nontender  Musculoskeletal: warm, no edema   CNS: Alert and awake,  Data Reviewed: Basic Metabolic Panel:  Recent Labs Lab 05/02/15 0845 05/03/15 0637 05/04/15 0944  NA 137 139 141  K 4.1 3.6 4.3  CL 88* 88* 89*  CO2 39* 45* 49*  GLUCOSE 152* 151* 165*  BUN 15 16 32*  CREATININE 0.58* 0.41* 0.61  CALCIUM 9.5 9.4 9.8   Liver Function Tests: No results for input(s): AST, ALT, ALKPHOS, BILITOT, PROT, ALBUMIN in the last 168 hours. No results for input(s): LIPASE, AMYLASE in the last 168 hours.  Recent Labs Lab 05/04/15 0944  AMMONIA 102*   CBC:  Recent Labs Lab 05/02/15 0845 05/03/15 0637 05/04/15 0944  WBC 10.1 14.6* 12.9*  NEUTROABS 8.9*  --   --  HGB 14.9 14.3 14.7  HCT 46.7 45.9 49.2  MCV 100.2* 100.4* 104.2*  PLT 106* 111* 130*   Cardiac Enzymes:  Recent Labs Lab 05/02/15 0845  TROPONINI 0.03   BNP (last 3 results)  Recent Labs  05/02/15 0845  BNP 580.0*    ProBNP (last 3 results) No results for input(s): PROBNP in the last 8760  hours.  CBG: No results for input(s): GLUCAP in the last 168 hours.  Recent Results (from the past 240 hour(s))  MRSA PCR Screening     Status: None   Collection Time: 05/04/15 10:26 AM  Result Value Ref Range Status   MRSA by PCR NEGATIVE NEGATIVE Final    Comment:        The GeneXpert MRSA Assay (FDA approved for NASAL specimens only), is one component of a comprehensive MRSA colonization surveillance program. It is not intended to diagnose MRSA infection nor to guide or monitor treatment for MRSA infections.      Studies: No results found.  Scheduled Meds: . albuterol  2.5 mg Nebulization Q6H  . antiseptic oral rinse  7 mL Mouth Rinse BID  . aspirin EC  81 mg Oral Daily  . atorvastatin  80 mg Oral Daily  . benazepril  40 mg Oral Daily  . budesonide-formoterol  2 puff Inhalation BID  . citalopram  20 mg Oral Daily  . enoxaparin (LOVENOX) injection  40 mg Subcutaneous Q24H  . furosemide  40 mg Intravenous BID  . levofloxacin (LEVAQUIN) IV  750 mg Intravenous Q24H  . methylPREDNISolone (SOLU-MEDROL) injection  40 mg Intravenous Q6H  . metoprolol succinate  12.5 mg Oral BID  . nicotine  7 mg Transdermal Daily  . potassium chloride SA  20 mEq Oral Daily  . sodium chloride  3 mL Intravenous Q12H  . tiotropium  18 mcg Inhalation Daily   Continuous Infusions:     Time spent: 35 minutes    Jadae Steinke, Benjamin  [facility] Pager [phone] If 7PM-7AM, please contact night-coverage at [email or web address], password [facility] 05/05/2015, 12:59 PM  LOS: 3 days

## 2015-05-05 NOTE — Progress Notes (Signed)
Subjective: He seems much better this morning. His blood gas had improved yesterday and overall I think he may be getting back to baseline. His blood gas this morning shows pH of 7.45 PCO2 of 68 and PO2 of 58 on BiPAP.  Objective: Vital signs in last 24 hours: Temp:  [97.7 F (36.5 C)-97.8 F (36.6 C)] 97.8 F (36.6 C) (12/29 0400) Pulse Rate:  [61-94] 86 (12/29 0500) Resp:  [15-23] 21 (12/29 0400) BP: (102-144)/(58-108) 124/93 mmHg (12/29 0500) SpO2:  [89 %-98 %] 96 % (12/29 0500) FiO2 (%):  [30 %-35 %] 30 % (12/29 0318) Weight:  [56.9 kg (125 lb 7.1 oz)] 56.9 kg (125 lb 7.1 oz) (12/29 0500) Weight change:  Last BM Date: 05/03/15  Intake/Output from previous day: 12/28 0701 - 12/29 0700 In: 60 [P.O.:60] Out: 1700 [Urine:1700]  PHYSICAL EXAM General appearance: He is sleepy. He is on BiPAP. He is easily arousable. Resp: rhonchi bilaterally and wheezes bilaterally Cardio: He seems to be in a trigeminal rhythm right now. I don't hear a gallop GI: soft, non-tender; bowel sounds normal; no masses,  no organomegaly Extremities: extremities normal, atraumatic, no cyanosis or edema  Lab Results:  Results for orders placed or performed during the hospital encounter of 05/02/15 (from the past 48 hour(s))  Blood gas, arterial     Status: Abnormal   Collection Time: 05/04/15  9:30 AM  Result Value Ref Range   O2 Content 8.0 L/min   Delivery systems HI FLOW NASAL CANNULA    pH, Arterial 7.168 (LL) 7.350 - 7.450    Comment: CRITICAL RESULT CALLED TO, READ BACK BY AND VERIFIED WITH: KNIGHT,C.RN AT 0940 05/04/15 BY BROADNAX,L.RRT    pCO2 arterial 146.0 (HH) 35.0 - 45.0 mmHg    Comment: CRITICAL RESULT CALLED TO, READ BACK BY AND VERIFIED WITH: KNIGHT,C.RN AT 0940 CRITICAL RESULT CALLED TO, READ BACK BY AND VERIFIED WITH: KNIGHT,C.RN AT 0940 05/04/15 BY BROADNAX,L.RRT    pO2, Arterial 167.0 (H) 80.0 - 100.0 mmHg   Bicarbonate 39.0 (H) 20.0 - 24.0 mEq/L   Acid-Base Excess 21.7 (H)  0.0 - 2.0 mmol/L   O2 Saturation 97.3 %   Collection site LEFT RADIAL    Drawn by [id number]    Sample type ARTERIAL    Allens test (pass/fail) PASS PASS  CBC     Status: Abnormal   Collection Time: 05/04/15  9:44 AM  Result Value Ref Range   WBC 12.9 (H) 4.0 - 10.5 K/uL   RBC 4.72 4.22 - 5.81 MIL/uL   Hemoglobin 14.7 13.0 - 17.0 g/dL   HCT 49.2 39.0 - 52.0 %   MCV 104.2 (H) 78.0 - 100.0 fL   MCH 31.1 26.0 - 34.0 pg   MCHC 29.9 (L) 30.0 - 36.0 g/dL   RDW 13.4 11.5 - 15.5 %   Platelets 130 (L) 150 - 400 K/uL  Basic metabolic panel     Status: Abnormal   Collection Time: 05/04/15  9:44 AM  Result Value Ref Range   Sodium 141 135 - 145 mmol/L   Potassium 4.3 3.5 - 5.1 mmol/L   Chloride 89 (L) 101 - 111 mmol/L   CO2 49 (H) 22 - 32 mmol/L   Glucose, Bld 165 (H) 65 - 99 mg/dL   BUN 32 (H) 6 - 20 mg/dL   Creatinine, Ser 0.61 0.61 - 1.24 mg/dL   Calcium 9.8 8.9 - 10.3 mg/dL   GFR calc non Af Amer >60 >60 mL/min   GFR calc Af Amer >  60 >60 mL/min    Comment: (NOTE) The eGFR has been calculated using the CKD EPI equation. This calculation has not been validated in all clinical situations. eGFR's persistently <60 mL/min signify possible Chronic Kidney Disease.    Anion gap 3 (L) 5 - 15  Ammonia     Status: Abnormal   Collection Time: 05/04/15  9:44 AM  Result Value Ref Range   Ammonia 102 (H) 9 - 35 umol/L  MRSA PCR Screening     Status: None   Collection Time: 05/04/15 10:26 AM  Result Value Ref Range   MRSA by PCR NEGATIVE NEGATIVE    Comment:        The GeneXpert MRSA Assay (FDA approved for NASAL specimens only), is one component of a comprehensive MRSA colonization surveillance program. It is not intended to diagnose MRSA infection nor to guide or monitor treatment for MRSA infections.   Urinalysis, Routine w reflex microscopic (not at [facility])     Status: Abnormal   Collection Time: 05/04/15  4:00 PM  Result Value Ref Range   Color, Urine YELLOW YELLOW   APPearance  CLEAR CLEAR   Specific Gravity, Urine >1.030 (H) 1.005 - 1.030   pH 6.0 5.0 - 8.0   Glucose, UA NEGATIVE NEGATIVE mg/dL   Hgb urine dipstick NEGATIVE NEGATIVE   Bilirubin Urine NEGATIVE NEGATIVE   Ketones, ur NEGATIVE NEGATIVE mg/dL   Protein, ur NEGATIVE NEGATIVE mg/dL   Nitrite NEGATIVE NEGATIVE   Leukocytes, UA NEGATIVE NEGATIVE    Comment: MICROSCOPIC NOT DONE ON URINES WITH NEGATIVE PROTEIN, BLOOD, LEUKOCYTES, NITRITE, OR GLUCOSE <1000 mg/dL.  Blood gas, arterial     Status: Abnormal   Collection Time: 05/04/15  4:00 PM  Result Value Ref Range   FIO2 0.30    O2 Content 30.0 L/min   Delivery systems BILEVEL POSITIVE AIRWAY PRESSURE    Mode BILEVEL POSITIVE AIRWAY PRESSURE    Inspiratory PAP 20    Expiratory PAP 6    pH, Arterial 7.442 7.350 - 7.450   pCO2 arterial 72.2 (HH) 35.0 - 45.0 mmHg    Comment: CORRECTED ON 12/28 AT 1654: PREVIOUSLY REPORTED AS 12.2 CRITICAL RESULT CALLED TO, READ BACK BY AND VERIFIED WITH: LEIHGANN SCHONEWITZ,RN ON 05/04/2015 BY PEVIANY LAWSON,RRT AT 7169.   pO2, Arterial 53.6 (L) 80.0 - 100.0 mmHg   Bicarbonate 43.8 (H) 20.0 - 24.0 mEq/L   Acid-Base Excess 22.4 (H) 0.0 - 2.0 mmol/L   O2 Saturation 87.7 %   Collection site ARTERIAL    Drawn by [phone]    Sample type ARTERIAL    Allens test (pass/fail) PASS PASS  Blood gas, arterial     Status: Abnormal   Collection Time: 05/05/15  3:54 AM  Result Value Ref Range   FIO2 0.30    Delivery systems BILEVEL POSITIVE AIRWAY PRESSURE    Inspiratory PAP 20    Expiratory PAP 6    pH, Arterial 7.456 (H) 7.350 - 7.450   pCO2 arterial 68.4 (HH) 35.0 - 45.0 mmHg    Comment: CRITICAL RESULT CALLED TO, READ BACK BY AND VERIFIED WITH: JESSICAN HEARN,RN BY BFRETWELL,RRT ON 05/05/15 AT 0404    pO2, Arterial 58.0 (L) 80.0 - 100.0 mmHg   Bicarbonate 43.3 (H) 20.0 - 24.0 mEq/L   TCO2 17.6 0 - 100 mmol/L   Acid-Base Excess 21.7 (H) 0.0 - 2.0 mmol/L   O2 Saturation 88.5 %   Collection site RIGHT RADIAL    Drawn by  [id number]    Sample  type ARTERIAL DRAW    Allens test (pass/fail) PASS PASS    ABGS  Recent Labs  05/05/15 0354  PHART 7.456*  PO2ART 58.0*  TCO2 17.6  HCO3 43.3*   CULTURES Recent Results (from the past 240 hour(s))  MRSA PCR Screening     Status: None   Collection Time: 05/04/15 10:26 AM  Result Value Ref Range Status   MRSA by PCR NEGATIVE NEGATIVE Final    Comment:        The GeneXpert MRSA Assay (FDA approved for NASAL specimens only), is one component of a comprehensive MRSA colonization surveillance program. It is not intended to diagnose MRSA infection nor to guide or monitor treatment for MRSA infections.    Studies/Results: No results found.  Medications:  Prior to Admission:  Prescriptions prior to admission  Medication Sig Dispense Refill Last Dose  . albuterol (PROVENTIL) (2.5 MG/3ML) 0.083% nebulizer solution USE 1 VIAL IN NEBULIZER EVERY 4 HOURS AS NEEDED (Patient taking differently: USE 1 VIAL IN NEBULIZER EVERY 4 HOURS AS NEEDED WHEEZING.) 150 mL PRN 05/01/2015 at Unknown time  . ALPRAZolam (XANAX) 1 MG tablet Take 0.5-1 mg by mouth 3 (three) times daily as needed for anxiety (Takes one-half tablet in the morning and afternoon. takes one tablet at bedtime daily).   Past Month at Unknown time  . aspirin 81 MG tablet Take 81 mg by mouth daily.   05/01/2015 at Unknown time  . atorvastatin (LIPITOR) 80 MG tablet TAKE 1 TABLET BY MOUTH DAILY. 30 tablet 6 05/01/2015 at Unknown time  . benazepril (LOTENSIN) 40 MG tablet TAKE 1 TABLET BY MOUTH DAILY. 90 tablet 0 05/01/2015 at Unknown time  . budesonide-formoterol (SYMBICORT) 160-4.5 MCG/ACT inhaler Inhale 2 puffs into the lungs 2 (two) times daily. 1 Inhaler 0 05/01/2015 at Unknown time  . citalopram (CELEXA) 20 MG tablet 1/2 qd 45 tablet 3 05/01/2015 at Unknown time  . furosemide (LASIX) 40 MG tablet TAKE 1 TABLET BY MOUTH 2 TIMES DAILY. 60 tablet 0 05/01/2015 at Unknown time  . HYDROcodone-acetaminophen (NORCO)  10-325 MG tablet Take 1 tablet by mouth 2 (two) times daily as needed (pain). 60 tablet 0 Past Week at Unknown time  . metoprolol succinate (TOPROL-XL) 25 MG 24 hr tablet Take 0.5 tablets (12.5 mg total) by mouth 2 (two) times daily. 90 tablet 3 05/01/2015 at 1800  . nitroGLYCERIN (NITROSTAT) 0.4 MG SL tablet Place 1 tablet (0.4 mg total) under the tongue every 5 (five) minutes as needed for chest pain. 100 tablet 0 UNKNOWN  . potassium chloride SA (K-DUR,KLOR-CON) 20 MEQ tablet TAKE 1 TABLET BY MOUTH DAILY. 90 tablet 1 05/01/2015 at Unknown time  . tamsulosin (FLOMAX) 0.4 MG CAPS capsule Take 1 capsule (0.4 mg total) by mouth daily. (Patient taking differently: Take 0.4 mg by mouth daily as needed (slow urination). ) 30 capsule 2 unknown  . tiotropium (SPIRIVA HANDIHALER) 18 MCG inhalation capsule PLACE 1 CAPSULE INTO INHALER AND INHALE DAILY 90 capsule 1 05/01/2015 at Unknown time  . VENTOLIN HFA 108 (90 BASE) MCG/ACT inhaler INHALE 2 PUFFS BY MOUTH EVERY 4-6 HOURS AS NEEDED 18 g 5 05/02/2015 at Unknown time  . benazepril (LOTENSIN) 20 MG tablet Take 1 tablet (20 mg total) by mouth daily. (Patient not taking: Reported on 05/02/2015) 90 tablet 3 Not Taking at Unknown time  . UNABLE TO FIND Place 2 L into the nose continuous. Oxygen 2 liters.   Taking   Scheduled: . albuterol  2.5 mg Nebulization Q6H  .  antiseptic oral rinse  7 mL Mouth Rinse BID  . aspirin EC  81 mg Oral Daily  . atorvastatin  80 mg Oral Daily  . benazepril  40 mg Oral Daily  . budesonide-formoterol  2 puff Inhalation BID  . citalopram  20 mg Oral Daily  . enoxaparin (LOVENOX) injection  40 mg Subcutaneous Q24H  . furosemide  40 mg Intravenous BID  . levofloxacin (LEVAQUIN) IV  750 mg Intravenous Q24H  . methylPREDNISolone (SOLU-MEDROL) injection  40 mg Intravenous Q6H  . metoprolol succinate  12.5 mg Oral BID  . nicotine  7 mg Transdermal Daily  . potassium chloride SA  20 mEq Oral Daily  . sodium chloride  3 mL  Intravenous Q12H  . tiotropium  18 mcg Inhalation Daily   Continuous:  [id number], ondansetron **OR** ondansetron (ZOFRAN) IV, tamsulosin  Assesment: He has acute on chronic hypoxic and hypercapnic respiratory failure. His blood gas was much improved this morning but he is still somewhat hypoxic. He remains on BiPAP but I think we may be able to get him off BiPAP today. Obviously we need to pay close attention to his oxygen saturation and oxygen saturation in the high 80s to low 90s would be okay for him. Continue his treatments for COPD exacerbation. Principal Problem:   Acute on chronic respiratory failure with hypoxia (HCC) Active Problems:   Abdominal aortic aneurysm (HCC)   COPD exacerbation (HCC)   Chronic pain syndrome   Dyspnea   Chronic systolic CHF (congestive heart failure) (HCC)   Protein-calorie malnutrition, severe    Plan: Attempt to come off BiPAP and see how he does    LOS: 3 days   Neeraj Housand L 05/05/2015, 7:45 AM

## 2015-05-06 LAB — BASIC METABOLIC PANEL
Anion gap: 7 (ref 5–15)
BUN: 29 mg/dL — AB (ref 6–20)
CALCIUM: 10.2 mg/dL (ref 8.9–10.3)
CO2: 47 mmol/L — AB (ref 22–32)
Chloride: 91 mmol/L — ABNORMAL LOW (ref 101–111)
Creatinine, Ser: 0.59 mg/dL — ABNORMAL LOW (ref 0.61–1.24)
GLUCOSE: 177 mg/dL — AB (ref 65–99)
POTASSIUM: 3.6 mmol/L (ref 3.5–5.1)
SODIUM: 145 mmol/L (ref 135–145)

## 2015-05-06 LAB — CBC
HCT: 48 % (ref 39.0–52.0)
HEMOGLOBIN: 14.9 g/dL (ref 13.0–17.0)
MCH: 31.4 pg (ref 26.0–34.0)
MCHC: 31 g/dL (ref 30.0–36.0)
MCV: 101.3 fL — ABNORMAL HIGH (ref 78.0–100.0)
PLATELETS: 121 10*3/uL — AB (ref 150–400)
RBC: 4.74 MIL/uL (ref 4.22–5.81)
RDW: 13.2 % (ref 11.5–15.5)
WBC: 11.7 10*3/uL — ABNORMAL HIGH (ref 4.0–10.5)

## 2015-05-06 LAB — URINE CULTURE: CULTURE: NO GROWTH

## 2015-05-06 NOTE — Care Management Important Message (Signed)
Important Message  Patient Details  Name: Tommy Turner MRN: [id number] Date of Birth: [birth year]   Medicare Important Message Given:  Yes    Sherald Barge, RN 05/06/2015, 3:44 PM

## 2015-05-06 NOTE — Progress Notes (Signed)
Subjective: He remains on BiPAP at night. He did okay with nasal cannula yesterday but his PCO2 was still in the 70s. I discussed the situation with his family at bedside and they say he does not have symptoms of sleep apnea. Specifically he does not have excess daytime somnolence snoring and they have not observed any episodes of apnea at night.  Objective: Vital signs in last 24 hours: Temp:  [97.3 F (36.3 C)-98 F (36.7 C)] 97.7 F (36.5 C) (12/30 0340) Pulse Rate:  [31-104] 91 (12/30 0500) Resp:  [17-27] 26 (12/30 0500) BP: (102-144)/(53-115) 116/70 mmHg (12/30 0500) SpO2:  [82 %-100 %] 96 % (12/30 0500) FiO2 (%):  [30 %] 30 % (12/30 0158) Weight:  [54.7 kg (120 lb 9.5 oz)] 54.7 kg (120 lb 9.5 oz) (12/30 0500) Weight change: -2.2 kg (-4 lb 13.6 oz) Last BM Date: 05/03/15  Intake/Output from previous day: 12/29 0701 - 12/30 0700 In: 450 [IV Piggyback:450] Out: 2350 [Urine:2350]  PHYSICAL EXAM General appearance: He is on BiPAP and sleeping but easily arousable Resp: rhonchi bilaterally Cardio: regular rate and rhythm, S1, S2 normal, no murmur, click, rub or gallop GI: soft, non-tender; bowel sounds normal; no masses,  no organomegaly Extremities: extremities normal, atraumatic, no cyanosis or edema  Lab Results:  Results for orders placed or performed during the hospital encounter of 05/02/15 (from the past 48 hour(s))  Blood gas, arterial     Status: Abnormal   Collection Time: 05/04/15  9:30 AM  Result Value Ref Range   O2 Content 8.0 L/min   Delivery systems HI FLOW NASAL CANNULA    pH, Arterial 7.168 (LL) 7.350 - 7.450    Comment: CRITICAL RESULT CALLED TO, READ BACK BY AND VERIFIED WITH: KNIGHT,C.RN AT 0940 05/04/15 BY BROADNAX,L.RRT    pCO2 arterial 146.0 (HH) 35.0 - 45.0 mmHg    Comment: CRITICAL RESULT CALLED TO, READ BACK BY AND VERIFIED WITH: KNIGHT,C.RN AT 0940 CRITICAL RESULT CALLED TO, READ BACK BY AND VERIFIED WITH: KNIGHT,C.RN AT 0940 05/04/15 BY  BROADNAX,L.RRT    pO2, Arterial 167.0 (H) 80.0 - 100.0 mmHg   Bicarbonate 39.0 (H) 20.0 - 24.0 mEq/L   Acid-Base Excess 21.7 (H) 0.0 - 2.0 mmol/L   O2 Saturation 97.3 %   Collection site LEFT RADIAL    Drawn by [id number]    Sample type ARTERIAL    Allens test (pass/fail) PASS PASS  CBC     Status: Abnormal   Collection Time: 05/04/15  9:44 AM  Result Value Ref Range   WBC 12.9 (H) 4.0 - 10.5 K/uL   RBC 4.72 4.22 - 5.81 MIL/uL   Hemoglobin 14.7 13.0 - 17.0 g/dL   HCT 49.2 39.0 - 52.0 %   MCV 104.2 (H) 78.0 - 100.0 fL   MCH 31.1 26.0 - 34.0 pg   MCHC 29.9 (L) 30.0 - 36.0 g/dL   RDW 13.4 11.5 - 15.5 %   Platelets 130 (L) 150 - 400 K/uL  Basic metabolic panel     Status: Abnormal   Collection Time: 05/04/15  9:44 AM  Result Value Ref Range   Sodium 141 135 - 145 mmol/L   Potassium 4.3 3.5 - 5.1 mmol/L   Chloride 89 (L) 101 - 111 mmol/L   CO2 49 (H) 22 - 32 mmol/L   Glucose, Bld 165 (H) 65 - 99 mg/dL   BUN 32 (H) 6 - 20 mg/dL   Creatinine, Ser 0.61 0.61 - 1.24 mg/dL   Calcium 9.8 8.9 -  10.3 mg/dL   GFR calc non Af Amer >60 >60 mL/min   GFR calc Af Amer >60 >60 mL/min    Comment: (NOTE) The eGFR has been calculated using the CKD EPI equation. This calculation has not been validated in all clinical situations. eGFR's persistently <60 mL/min signify possible Chronic Kidney Disease.    Anion gap 3 (L) 5 - 15  Ammonia     Status: Abnormal   Collection Time: 05/04/15  9:44 AM  Result Value Ref Range   Ammonia 102 (H) 9 - 35 umol/L  MRSA PCR Screening     Status: None   Collection Time: 05/04/15 10:26 AM  Result Value Ref Range   MRSA by PCR NEGATIVE NEGATIVE    Comment:        The GeneXpert MRSA Assay (FDA approved for NASAL specimens only), is one component of a comprehensive MRSA colonization surveillance program. It is not intended to diagnose MRSA infection nor to guide or monitor treatment for MRSA infections.   Urinalysis, Routine w reflex microscopic (not at  [facility])     Status: Abnormal   Collection Time: 05/04/15  4:00 PM  Result Value Ref Range   Color, Urine YELLOW YELLOW   APPearance CLEAR CLEAR   Specific Gravity, Urine >1.030 (H) 1.005 - 1.030   pH 6.0 5.0 - 8.0   Glucose, UA NEGATIVE NEGATIVE mg/dL   Hgb urine dipstick NEGATIVE NEGATIVE   Bilirubin Urine NEGATIVE NEGATIVE   Ketones, ur NEGATIVE NEGATIVE mg/dL   Protein, ur NEGATIVE NEGATIVE mg/dL   Nitrite NEGATIVE NEGATIVE   Leukocytes, UA NEGATIVE NEGATIVE    Comment: MICROSCOPIC NOT DONE ON URINES WITH NEGATIVE PROTEIN, BLOOD, LEUKOCYTES, NITRITE, OR GLUCOSE <1000 mg/dL.  Blood gas, arterial     Status: Abnormal   Collection Time: 05/04/15  4:00 PM  Result Value Ref Range   FIO2 0.30    O2 Content 30.0 L/min   Delivery systems BILEVEL POSITIVE AIRWAY PRESSURE    Mode BILEVEL POSITIVE AIRWAY PRESSURE    Inspiratory PAP 20    Expiratory PAP 6    pH, Arterial 7.442 7.350 - 7.450   pCO2 arterial 72.2 (HH) 35.0 - 45.0 mmHg    Comment: CORRECTED ON 12/28 AT 1654: PREVIOUSLY REPORTED AS 12.2 CRITICAL RESULT CALLED TO, READ BACK BY AND VERIFIED WITH: LEIHGANN SCHONEWITZ,RN ON 05/04/2015 BY PEVIANY LAWSON,RRT AT 4401.   pO2, Arterial 53.6 (L) 80.0 - 100.0 mmHg   Bicarbonate 43.8 (H) 20.0 - 24.0 mEq/L   Acid-Base Excess 22.4 (H) 0.0 - 2.0 mmol/L   O2 Saturation 87.7 %   Collection site ARTERIAL    Drawn by [phone]    Sample type ARTERIAL    Allens test (pass/fail) PASS PASS  Blood gas, arterial     Status: Abnormal   Collection Time: 05/05/15  3:54 AM  Result Value Ref Range   FIO2 0.30    Delivery systems BILEVEL POSITIVE AIRWAY PRESSURE    Inspiratory PAP 20    Expiratory PAP 6    pH, Arterial 7.456 (H) 7.350 - 7.450   pCO2 arterial 68.4 (HH) 35.0 - 45.0 mmHg    Comment: CRITICAL RESULT CALLED TO, READ BACK BY AND VERIFIED WITH: JESSICAN HEARN,RN BY BFRETWELL,RRT ON 05/05/15 AT 0404    pO2, Arterial 58.0 (L) 80.0 - 100.0 mmHg   Bicarbonate 43.3 (H) 20.0 - 24.0 mEq/L   TCO2  17.6 0 - 100 mmol/L   Acid-Base Excess 21.7 (H) 0.0 - 2.0 mmol/L   O2 Saturation  88.5 %   Collection site RIGHT RADIAL    Drawn by [phone]    Sample type ARTERIAL DRAW    Allens test (pass/fail) PASS PASS  Blood gas, arterial     Status: Abnormal   Collection Time: 05/05/15  6:33 PM  Result Value Ref Range   O2 Content 2.0 L/min   Delivery systems NASAL CANNULA    pH, Arterial 7.454 (H) 7.350 - 7.450   pCO2 arterial 73.1 (HH) 35.0 - 45.0 mmHg    Comment: CRITICAL RESULT CALLED TO, READ BACK BY AND VERIFIED WITH: LEIGHANN SCHONEWITZ,RN ON12/29/2016 BY PEVIANY LAWSON,RRT AT 1847.    pO2, Arterial 51.7 (L) 80.0 - 100.0 mmHg   Bicarbonate 45.5 (H) 20.0 - 24.0 mEq/L   Acid-Base Excess 24.3 (H) 0.0 - 2.0 mmol/L   O2 Saturation 84.2 %   Collection site RIGHT RADIAL    Drawn by [phone]    Sample type ARTERIAL    Allens test (pass/fail) PASS PASS  CBC     Status: Abnormal   Collection Time: 05/06/15  4:28 AM  Result Value Ref Range   WBC 11.7 (H) 4.0 - 10.5 K/uL   RBC 4.74 4.22 - 5.81 MIL/uL   Hemoglobin 14.9 13.0 - 17.0 g/dL   HCT 48.0 39.0 - 52.0 %   MCV 101.3 (H) 78.0 - 100.0 fL   MCH 31.4 26.0 - 34.0 pg   MCHC 31.0 30.0 - 36.0 g/dL   RDW 13.2 11.5 - 15.5 %   Platelets 121 (L) 150 - 400 K/uL  Basic metabolic panel     Status: Abnormal   Collection Time: 05/06/15  4:28 AM  Result Value Ref Range   Sodium 145 135 - 145 mmol/L   Potassium 3.6 3.5 - 5.1 mmol/L   Chloride 91 (L) 101 - 111 mmol/L   CO2 47 (H) 22 - 32 mmol/L   Glucose, Bld 177 (H) 65 - 99 mg/dL   BUN 29 (H) 6 - 20 mg/dL   Creatinine, Ser 0.59 (L) 0.61 - 1.24 mg/dL   Calcium 10.2 8.9 - 10.3 mg/dL   GFR calc non Af Amer >60 >60 mL/min   GFR calc Af Amer >60 >60 mL/min    Comment: (NOTE) The eGFR has been calculated using the CKD EPI equation. This calculation has not been validated in all clinical situations. eGFR's persistently <60 mL/min signify possible Chronic Kidney Disease.    Anion gap 7 5 - 15     ABGS  Recent Labs  05/05/15 0354 05/05/15 1833  PHART 7.456* 7.454*  PO2ART 58.0* 51.7*  TCO2 17.6  --   HCO3 43.3* 45.5*   CULTURES Recent Results (from the past 240 hour(s))  MRSA PCR Screening     Status: None   Collection Time: 05/04/15 10:26 AM  Result Value Ref Range Status   MRSA by PCR NEGATIVE NEGATIVE Final    Comment:        The GeneXpert MRSA Assay (FDA approved for NASAL specimens only), is one component of a comprehensive MRSA colonization surveillance program. It is not intended to diagnose MRSA infection nor to guide or monitor treatment for MRSA infections.    Studies/Results: No results found.  Medications:  Prior to Admission:  Prescriptions prior to admission  Medication Sig Dispense Refill Last Dose  . albuterol (PROVENTIL) (2.5 MG/3ML) 0.083% nebulizer solution USE 1 VIAL IN NEBULIZER EVERY 4 HOURS AS NEEDED (Patient taking differently: USE 1 VIAL IN NEBULIZER EVERY 4 HOURS AS NEEDED WHEEZING.) 150 mL  PRN 05/01/2015 at Unknown time  . ALPRAZolam (XANAX) 1 MG tablet Take 0.5-1 mg by mouth 3 (three) times daily as needed for anxiety (Takes one-half tablet in the morning and afternoon. takes one tablet at bedtime daily).   Past Month at Unknown time  . aspirin 81 MG tablet Take 81 mg by mouth daily.   05/01/2015 at Unknown time  . atorvastatin (LIPITOR) 80 MG tablet TAKE 1 TABLET BY MOUTH DAILY. 30 tablet 6 05/01/2015 at Unknown time  . benazepril (LOTENSIN) 40 MG tablet TAKE 1 TABLET BY MOUTH DAILY. 90 tablet 0 05/01/2015 at Unknown time  . budesonide-formoterol (SYMBICORT) 160-4.5 MCG/ACT inhaler Inhale 2 puffs into the lungs 2 (two) times daily. 1 Inhaler 0 05/01/2015 at Unknown time  . citalopram (CELEXA) 20 MG tablet 1/2 qd 45 tablet 3 05/01/2015 at Unknown time  . furosemide (LASIX) 40 MG tablet TAKE 1 TABLET BY MOUTH 2 TIMES DAILY. 60 tablet 0 05/01/2015 at Unknown time  . HYDROcodone-acetaminophen (NORCO) 10-325 MG tablet Take 1 tablet by  mouth 2 (two) times daily as needed (pain). 60 tablet 0 Past Week at Unknown time  . metoprolol succinate (TOPROL-XL) 25 MG 24 hr tablet Take 0.5 tablets (12.5 mg total) by mouth 2 (two) times daily. 90 tablet 3 05/01/2015 at 1800  . nitroGLYCERIN (NITROSTAT) 0.4 MG SL tablet Place 1 tablet (0.4 mg total) under the tongue every 5 (five) minutes as needed for chest pain. 100 tablet 0 UNKNOWN  . potassium chloride SA (K-DUR,KLOR-CON) 20 MEQ tablet TAKE 1 TABLET BY MOUTH DAILY. 90 tablet 1 05/01/2015 at Unknown time  . tamsulosin (FLOMAX) 0.4 MG CAPS capsule Take 1 capsule (0.4 mg total) by mouth daily. (Patient taking differently: Take 0.4 mg by mouth daily as needed (slow urination). ) 30 capsule 2 unknown  . tiotropium (SPIRIVA HANDIHALER) 18 MCG inhalation capsule PLACE 1 CAPSULE INTO INHALER AND INHALE DAILY 90 capsule 1 05/01/2015 at Unknown time  . VENTOLIN HFA 108 (90 BASE) MCG/ACT inhaler INHALE 2 PUFFS BY MOUTH EVERY 4-6 HOURS AS NEEDED 18 g 5 05/02/2015 at Unknown time  . benazepril (LOTENSIN) 20 MG tablet Take 1 tablet (20 mg total) by mouth daily. (Patient not taking: Reported on 05/02/2015) 90 tablet 3 Not Taking at Unknown time  . UNABLE TO FIND Place 2 L into the nose continuous. Oxygen 2 liters.   Taking   Scheduled: . albuterol  2.5 mg Nebulization Q6H  . antiseptic oral rinse  7 mL Mouth Rinse BID  . aspirin EC  81 mg Oral Daily  . atorvastatin  80 mg Oral Daily  . benazepril  40 mg Oral Daily  . budesonide-formoterol  2 puff Inhalation BID  . citalopram  20 mg Oral Daily  . enoxaparin (LOVENOX) injection  40 mg Subcutaneous Q24H  . furosemide  40 mg Intravenous BID  . levofloxacin (LEVAQUIN) IV  750 mg Intravenous Q24H  . methylPREDNISolone (SOLU-MEDROL) injection  40 mg Intravenous Q6H  . metoprolol succinate  12.5 mg Oral BID  . nicotine  7 mg Transdermal Daily  . potassium chloride SA  20 mEq Oral Daily  . sodium chloride  3 mL Intravenous Q12H  . tiotropium  18 mcg  Inhalation Daily   Continuous:  DSK:[id number], ondansetron **OR** ondansetron (ZOFRAN) IV, tamsulosin  Assesment: He has acute on chronic hypercapnic and hypoxic respiratory failure. This is related to COPD exacerbation and he also had some evidence of heart failure. His PCO2 is higher than it was about a year  ago but he may have established a new baseline. He is still requiring BiPAP at night. He is still on IV steroids and IV antibiotics. He does not have symptoms of sleep apnea. Principal Problem:   Acute on chronic respiratory failure with hypoxia (HCC) Active Problems:   Abdominal aortic aneurysm (HCC)   COPD exacerbation (HCC)   Chronic pain syndrome   Dyspnea   Chronic systolic CHF (congestive heart failure) (HCC)   Protein-calorie malnutrition, severe    Plan: Continue current treatments. Transition to nasal cannula again today. I think he will probably require BiPAP at home.  Continue his other treatments. No changes at this point. He is slowly improving   LOS: 4 days   Vickee Mormino L 05/06/2015, 7:38 AM

## 2015-05-06 NOTE — Progress Notes (Signed)
[facility] PROGRESS NOTE  BRODERICK COLLAMORE [id number] DOB: Sep 26, 1940 DOA: 05/02/2015 PCP: Sallee Lange, MD  Brief narrative 74 year old male with severe COPD on 2 L home O2, ongoing tobacco use (4-5 cigarettes a day), AAA with recent repair, CAD status post CABG, history of CHF with EF of 25%, anxiety and hypertension brought to the ED with shortness of breath for past 2 days. In the ED he appeared to have acute COPD exacerbation. Chest x-ray was unremarkable for acute infiltrate or congestion. Patient given IV Lasix, IV Solu-Medrol and admitted to hospitalist service for COPD exacerbation. On 12/28 patient increasingly lethargic and somnolent. ABG done showing severe hypercapnic respiratory failure Transferred to stepdown on BiPAP. Pulmonary consulted.  Assessment/Plan: Acute on chronic hypercapnic respiratory failure Secondary to COPD exacerbation and now with CO2 narcosis. ABG shows respiratory acidosis with severe hypercapnia. (PCO2 of 144!!).  Patient was somnolent on 12/28 and was transferred to ICU on BiPAP. Improved Activity will be BiPAP PCO2 trending down. He alert and oriented -Continue Solu-Medrol scheduled nebs and empiric antibiotic. -Appreciate pulmonary recommendations. Weaning off BiPAP. Patient tolerated nasal cannula for most of the day yesterday and was placed on BiPAP since last evening. -Pulmonary recommends that he may have sleep apnea and would benefit from home BiPAP.   Chronic systolic CHF 2-D echo repeated showing EF of 25%. Does not appear volume overloaded. Continue aspirin, statin, beta blocker, Lasix and ACE inhibitor. Monitor I/O.  Ongoing tobacco use Discussed  on cessation. Wishes to quit. Continue nicotine patch.  Severe protein calorie malnutrition Added supplement.  CAD with history of AAA Stable. Continue home medications.  Chronic thrombocytopenia Stable. Monitor    DVT prophylaxis: Subcutaneous Lovenox Diet: Nothing by  mouth  Code Status: FULL Code Family Communication: wife at bedside Disposition Plan: Home possibly early next week if bleeding continues to improve.   Consultants:  pulmonary    Procedures:  None  Antibiotics:  Levaquin  HPI/Subjective: seen and examined. End of BiPAP on nasal cannula most of the day and then placed back on BiPAP after 6 PM last evening. Follow-up ABG with improved PCO2.  Objective: Filed Vitals:   05/06/15 1044 05/06/15 1200  BP:    Pulse: 40   Temp:  97.2 F (36.2 C)  Resp: 25     Intake/Output Summary (Last 24 hours) at 05/06/15 1330 Last data filed at 05/06/15 1000  Gross per 24 hour  Intake    810 ml  Output   2350 ml  Net  -1540 ml   Filed Weights   05/02/15 1410 05/05/15 0500 05/06/15 0500  Weight: 61 kg (134 lb 7.7 oz) 56.9 kg (125 lb 7.1 oz) 54.7 kg (120 lb 9.5 oz)    Exam:   General:  Elderly thin built male lying in bed on BiPAP,   HEENT: On BiPAP, supple neck   chest: Clear bilaterally  Cardiovascular: Normal S1 and S2, no murmurs  GI: Soft, nondistended, nontender  Musculoskeletal: warm, no edema    Data Reviewed: Basic Metabolic Panel:  Recent Labs Lab 05/02/15 0845 05/03/15 0637 05/04/15 0944 05/06/15 0428  NA 137 139 141 145  K 4.1 3.6 4.3 3.6  CL 88* 88* 89* 91*  CO2 39* 45* 49* 47*  GLUCOSE 152* 151* 165* 177*  BUN 15 16 32* 29*  CREATININE 0.58* 0.41* 0.61 0.59*  CALCIUM 9.5 9.4 9.8 10.2   Liver Function Tests: No results for input(s): AST, ALT, ALKPHOS, BILITOT, PROT, ALBUMIN in the last 168 hours. No results  for input(s): LIPASE, AMYLASE in the last 168 hours.  Recent Labs Lab 05/04/15 0944  AMMONIA 102*   CBC:  Recent Labs Lab 05/02/15 0845 05/03/15 0637 05/04/15 0944 05/06/15 0428  WBC 10.1 14.6* 12.9* 11.7*  NEUTROABS 8.9*  --   --   --   HGB 14.9 14.3 14.7 14.9  HCT 46.7 45.9 49.2 48.0  MCV 100.2* 100.4* 104.2* 101.3*  PLT 106* 111* 130* 121*   Cardiac Enzymes:  Recent  Labs Lab 05/02/15 0845  TROPONINI 0.03   BNP (last 3 results)  Recent Labs  05/02/15 0845  BNP 580.0*    ProBNP (last 3 results) No results for input(s): PROBNP in the last 8760 hours.  CBG: No results for input(s): GLUCAP in the last 168 hours.  Recent Results (from the past 240 hour(s))  MRSA PCR Screening     Status: None   Collection Time: 05/04/15 10:26 AM  Result Value Ref Range Status   MRSA by PCR NEGATIVE NEGATIVE Final    Comment:        The GeneXpert MRSA Assay (FDA approved for NASAL specimens only), is one component of a comprehensive MRSA colonization surveillance program. It is not intended to diagnose MRSA infection nor to guide or monitor treatment for MRSA infections.   Urine culture     Status: None   Collection Time: 05/04/15  4:00 PM  Result Value Ref Range Status   Specimen Description URINE, CLEAN CATCH  Final   Special Requests NONE  Final   Culture   Final    NO GROWTH 1 DAY Performed at [facility]    Report Status 05/06/2015 FINAL  Final     Studies: No results found.  Scheduled Meds: . albuterol  2.5 mg Nebulization Q6H  . antiseptic oral rinse  7 mL Mouth Rinse BID  . aspirin EC  81 mg Oral Daily  . atorvastatin  80 mg Oral Daily  . benazepril  40 mg Oral Daily  . budesonide-formoterol  2 puff Inhalation BID  . citalopram  20 mg Oral Daily  . enoxaparin (LOVENOX) injection  40 mg Subcutaneous Q24H  . furosemide  40 mg Intravenous BID  . levofloxacin (LEVAQUIN) IV  750 mg Intravenous Q24H  . methylPREDNISolone (SOLU-MEDROL) injection  40 mg Intravenous Q6H  . metoprolol succinate  12.5 mg Oral BID  . nicotine  7 mg Transdermal Daily  . potassium chloride SA  20 mEq Oral Daily  . sodium chloride  3 mL Intravenous Q12H  . tiotropium  18 mcg Inhalation Daily   Continuous Infusions:     Time spent: 25 minutes    Kasheem Toner, [place]  [facility] Pager [phone] If 7PM-7AM, please contact night-coverage at  [email or web address], password [facility] 05/06/2015, 1:30 PM  LOS: 4 days

## 2015-05-06 NOTE — Care Management Note (Signed)
Case Management Note  Patient Details  Name: Tommy Turner MRN: [id number] Date of Birth: 07/12/1940  Expected Discharge Date:                  Expected Discharge Plan:  Home/Self Care  In-House Referral:  NA  Discharge planning Services  CM Consult  Post Acute Care Choice:  Durable Medical Equipment Choice offered to:  Patient  DME Arranged:  Bipap DME Agency:  [place] Arranged:    [facility] Agency:     Status of Service:  In process, will continue to follow  Medicare Important Message Given:  Yes Date Medicare IM Given:    Medicare IM give by:    Date Additional Medicare IM Given:    Additional Medicare Important Message give by:     If discussed at [place] of Stay Meetings, dates discussed:    Additional Comments: DC over weekend not anticipated. Per MD pt would benefit from BIPAP. Pt unsure if he will agree to wear BIPAP at home. Referral given to [facility]. Will cont to follow and referral will be completed it pt agree's to use BIPAP at home. Will cont to follow.   Sherald Barge, RN 05/06/2015, 3:45 PM

## 2015-05-07 MED ORDER — LEVOFLOXACIN 750 MG PO TABS
750.0000 mg | ORAL_TABLET | ORAL | Status: AC
  Administered 2015-05-07 – 2015-05-08 (×2): 750 mg via ORAL
  Filled 2015-05-07 (×2): qty 1

## 2015-05-07 MED ORDER — METHYLPREDNISOLONE SODIUM SUCC 40 MG IJ SOLR
40.0000 mg | Freq: Two times a day (BID) | INTRAMUSCULAR | Status: DC
  Administered 2015-05-07 – 2015-05-08 (×2): 40 mg via INTRAVENOUS
  Filled 2015-05-07 (×2): qty 1

## 2015-05-07 NOTE — Progress Notes (Signed)
PROGRESS NOTE  Tommy Turner [id number] DOB: December 09, 1940 DOA: 05/02/2015 PCP: Sallee Lange, MD  Summary: 20 yom with PMH of severe COPD on 2L chronic oxygen,ongoing tobacco use, AAA with recent repair, CAD status post CABG, CHF, and HTN presented with complaints of persistent shortness of breath. In the in ED it is reported that have COPD exacerbation. Patient given IV Lasix, Solu-Medrol and admitted/ 12/28 it is reported that he became increasingly lethargic and somnolent with ABG showing severe hypercapnic respiratory failure.    Assessment/Plan: 1. Acute on chronic hypercapnic respiratory failure, CO2 narcosis, secondary to COPD exacerbation. Appreciate pulmonology recommendations. Will continue to encourage BiPAP as needed. Respiratory status continues to improve. 2. Chronic systolic CHF. Euvolemic. 3. Ongoing tobacco use. 4. Severe protein calorie malnutrition 5. CAD with history of AAA, stable on home medications.  6. Chronic thrombocytopenia, remains stable.    Continues to improve.   Will wean steroids, nebs, oxygne, BiPAP,   Code Status: Full DVT prophylaxis: Lovenox Family Communication: Discussed with patient who understands and has no concerns at this time. Disposition Plan: Anticipate discharge within 2-3 days.  [name], MD  [facility]  Pager [phone] If 7PM-7AM, please contact night-coverage at [email or web address], password [email or web address] 05/07/2015, 6:33 AM  LOS: 5 days   Consultants:  Pulmonology   Procedures:  ECHO Study Conclusions  - Left ventricle: LVEF is approximately 20 to 25% with severe hypokinesis of the lateral wall; akinenesis of the distal lateral, distal septal, apical, mid/distal inferior , distal anteior walls False tendon seen in LV. The cavity size was normal. Wall thickness was normal. Doppler parameters are consistent with abnormal left ventricular relaxation (grade 1 diastolic dysfunction). - Mitral valve: Calcified  annulus. Mildly thickened leaflets . - Left atrium: The atrium was moderately to severely dilated. - Right ventricle: The cavity size was mildly dilated. Systolic function was moderately reduced. - Right atrium: The atrium was moderately dilated.  Antibiotics:  Levaquin 12/26>>  HPI/Subjective: Feeling fine. No reports of chest pain, shortness of breath, n/v/d, or abd pain. Able to eat without difficulty. Objective: Filed Vitals:   05/07/15 0300 05/07/15 0400 05/07/15 0500 05/07/15 0600  BP: 123/66 129/69 121/99 122/74  Pulse: 87 67 82 85  Temp:  97.8 F (36.6 C)    TempSrc:  Axillary    Resp: 16 19 21 19   Height:      Weight:  54.4 kg (119 lb 14.9 oz)    SpO2: 98% 97% 97% 95%    Intake/Output Summary (Last 24 hours) at 05/07/15 [id number] Last data filed at 05/07/15 0500  Gross per 24 hour  Intake   1230 ml  Output   1350 ml  Net   -120 ml     Filed Weights   05/05/15 0500 05/06/15 0500 05/07/15 0400  Weight: 56.9 kg (125 lb 7.1 oz) 54.7 kg (120 lb 9.5 oz) 54.4 kg (119 lb 14.9 oz)    Exam:   Afebrile, VSS,  General:  Appears calm and comfortable Eyes: PERRL, normal lids, irises & conjunctiva ENT: grossly normal hearing, lips & tongue Neck: no LAD, masses or thyromegaly Cardiovascular: RRR, no m/r/g. No LE edema. Telemetry: SR, no arrhythmias  Respiratory: Decreased breath sounds. Fair air movements. Speaks in full sentences. Abdomen: soft, ntnd Musculoskeletal: grossly normal tone BUE/BLE Psychiatric: grossly normal mood and affect, speech fluent and appropriate Neurologic: grossly non-focal.  New data reviewed:  BMP unremarkable  CBC unremarkable, except, decreased WBC   Pertinent data since admission:    Pending data:  Scheduled Meds: . albuterol  2.5 mg Nebulization Q6H  . antiseptic oral rinse  7 mL Mouth Rinse BID  . aspirin EC  81 mg Oral Daily  . atorvastatin  80 mg Oral Daily  . benazepril  40 mg Oral Daily  . budesonide-formoterol  2  puff Inhalation BID  . citalopram  20 mg Oral Daily  . enoxaparin (LOVENOX) injection  40 mg Subcutaneous Q24H  . furosemide  40 mg Intravenous BID  . levofloxacin (LEVAQUIN) IV  750 mg Intravenous Q24H  . methylPREDNISolone (SOLU-MEDROL) injection  40 mg Intravenous Q6H  . metoprolol succinate  12.5 mg Oral BID  . nicotine  7 mg Transdermal Daily  . potassium chloride SA  20 mEq Oral Daily  . sodium chloride  3 mL Intravenous Q12H  . tiotropium  18 mcg Inhalation Daily   Continuous Infusions:   Principal Problem:   Acute on chronic respiratory failure with hypoxia (HCC) Active Problems:   Abdominal aortic aneurysm (HCC)   COPD exacerbation (HCC)   Chronic pain syndrome   Dyspnea   Chronic systolic CHF (congestive heart failure) ([place])   Protein-calorie malnutrition, severe   Time spent 25 minutes    By signing my name below, I, Rennis Harding attest that this documentation has been prepared under the direction and in the presence of [name], MD Electronically signed: Rennis Harding  05/07/2015 9:11am   I personally performed the services described in this documentation. All medical record entries made by the scribe were at my direction. I have reviewed the chart and agree that the record reflects my personal performance and is accurate and complete. [name], MD

## 2015-05-07 NOTE — Progress Notes (Signed)
PT IS TRANSFERRING TO ROOM 338 AS A MED/SURG PT. IV/NSL PATENT. O2 AT 3L/MIN VIA [place]. BREATH SOUNDS DIMINISHED. [facility] CATH REMAINS IN PLACE. DENIES ANY DISTRESS.WIFE AT BEDSIDE. TRANSFER REPORT GIVEN TO CRYSTAL RN ON 300.

## 2015-05-08 DIAGNOSIS — E872 Acidosis: Secondary | ICD-10-CM | POA: Diagnosis not present

## 2015-05-08 DIAGNOSIS — I5022 Chronic systolic (congestive) heart failure: Secondary | ICD-10-CM | POA: Diagnosis not present

## 2015-05-08 DIAGNOSIS — J441 Chronic obstructive pulmonary disease with (acute) exacerbation: Secondary | ICD-10-CM | POA: Diagnosis not present

## 2015-05-08 DIAGNOSIS — I110 Hypertensive heart disease with heart failure: Secondary | ICD-10-CM | POA: Diagnosis not present

## 2015-05-08 DIAGNOSIS — J9621 Acute and chronic respiratory failure with hypoxia: Secondary | ICD-10-CM | POA: Diagnosis not present

## 2015-05-08 DIAGNOSIS — D696 Thrombocytopenia, unspecified: Secondary | ICD-10-CM | POA: Diagnosis not present

## 2015-05-08 DIAGNOSIS — Z9981 Dependence on supplemental oxygen: Secondary | ICD-10-CM | POA: Diagnosis not present

## 2015-05-08 DIAGNOSIS — J9622 Acute and chronic respiratory failure with hypercapnia: Secondary | ICD-10-CM | POA: Diagnosis not present

## 2015-05-08 DIAGNOSIS — E43 Unspecified severe protein-calorie malnutrition: Secondary | ICD-10-CM | POA: Diagnosis not present

## 2015-05-08 MED ORDER — PREDNISONE 20 MG PO TABS
60.0000 mg | ORAL_TABLET | Freq: Every day | ORAL | Status: DC
Start: 2015-05-09 — End: 2015-05-10
  Administered 2015-05-09 – 2015-05-10 (×2): 60 mg via ORAL
  Filled 2015-05-08 (×2): qty 3

## 2015-05-08 NOTE — Progress Notes (Signed)
[facility] PROGRESS NOTE  Tommy Turner [id number] DOB: 01-23-1941 DOA: 05/02/2015 PCP: Sallee Lange, MD  Assessment/Plan: 1. Acute on chronic hypercapnic respiratory failure, CO2 narcosis, secondary to COPD exacerbation. Appreciate pulmonology recommendations. Respiratory status remains stable on 3L. Has been stable off BiPAP for the last two nights..   2. COPD exacerbation, stable. Patient is on oral abx. Will transition Prednisone taper and continue nebs.  3. Chronic systolic CHF. Appears compensated. Resume lasix as outpatient. He is on a beta blocker.   4. Ongoing tobacco use. Counseled on importance of tobacco cessation.  5. Severe protein calorie malnutrition. Nutrition is following. 6. CAD with history of AAA, stable on home medications. No complaints of chest pain.  7. Chronic thrombocytopenia, remains stable. Conitnue to follow as outpatient.  8. HLD. Continue statin.  9. Essential HTN. Continue home medications.  10. Generalized weakness. Will consult PT to determine discharge disposition.  Code Status: Full DVT prophylaxis: Lovenox   Family Communication: Family bedside.  Disposition Plan: Anticipate discharge in 24 hours pending PT recommendations.     Consultants:  Pulmonology   Procedures:  ECHO Study Conclusions  - Left ventricle: LVEF is approximately 20 to 25% with severe hypokinesis of the lateral wall; akinenesis of the distal lateral, distal septal, apical, mid/distal inferior , distal anteior walls False tendon seen in LV. The cavity size was normal. Wall thickness was normal. Doppler parameters are consistent with abnormal left ventricular relaxation (grade 1 diastolic dysfunction). - Mitral valve: Calcified annulus. Mildly thickened leaflets . - Left atrium: The atrium was moderately to severely dilated. - Right ventricle: The cavity size was mildly dilated. Systolic function was moderately reduced. - Right atrium: The  atrium was moderately dilated.  Antibiotics:  Levaquin 12/26>>  HPI/Subjective: Feels like breathing is doing better good. IS open to PT recommendations for discharge.  Objective: Filed Vitals:   05/07/15 2230 05/08/15 0612  BP: 100/51 146/46  Pulse: 62 54  Temp: 98.1 F (36.7 C) 97.6 F (36.4 C)  Resp: 21 21    Intake/Output Summary (Last 24 hours) at 05/08/15 0729 Last data filed at 05/07/15 1500  Gross per 24 hour  Intake    720 ml  Output      0 ml  Net    720 ml   Filed Weights   05/05/15 0500 05/06/15 0500 05/07/15 0400  Weight: 56.9 kg (125 lb 7.1 oz) 54.7 kg (120 lb 9.5 oz) 54.4 kg (119 lb 14.9 oz)    Exam:  General: NAD, looks comfortable Cardiovascular: RRR, S1, S2  Respiratory: clear bilaterally, No wheezing, rales or rhonchi Abdomen: soft, non tender, no distention , bowel sounds normal Musculoskeletal: No edema b/l  Data Reviewed: Basic Metabolic Panel:  Recent Labs Lab 05/02/15 0845 05/03/15 0637 05/04/15 0944 05/06/15 0428  NA 137 139 141 145  K 4.1 3.6 4.3 3.6  CL 88* 88* 89* 91*  CO2 39* 45* 49* 47*  GLUCOSE 152* 151* 165* 177*  BUN 15 16 32* 29*  CREATININE 0.58* 0.41* 0.61 0.59*  CALCIUM 9.5 9.4 9.8 10.2    Recent Labs Lab 05/04/15 0944  AMMONIA 102*   CBC:  Recent Labs Lab 05/02/15 0845 05/03/15 0637 05/04/15 0944 05/06/15 0428  WBC 10.1 14.6* 12.9* 11.7*  NEUTROABS 8.9*  --   --   --   HGB 14.9 14.3 14.7 14.9  HCT 46.7 45.9 49.2 48.0  MCV 100.2* 100.4* 104.2* 101.3*  PLT 106* 111* 130* 121*   Cardiac Enzymes:  Recent  Labs Lab 05/02/15 0845  TROPONINI 0.03   BNP (last 3 results)  Recent Labs  05/02/15 0845  BNP 580.0*    Recent Results (from the past 240 hour(s))  MRSA PCR Screening     Status: None   Collection Time: 05/04/15 10:26 AM  Result Value Ref Range Status   MRSA by PCR NEGATIVE NEGATIVE Final    Comment:        The GeneXpert MRSA Assay (FDA approved for NASAL specimens only), is one  component of a comprehensive MRSA colonization surveillance program. It is not intended to diagnose MRSA infection nor to guide or monitor treatment for MRSA infections.   Urine culture     Status: None   Collection Time: 05/04/15  4:00 PM  Result Value Ref Range Status   Specimen Description URINE, CLEAN CATCH  Final   Special Requests NONE  Final   Culture   Final    NO GROWTH 1 DAY Performed at [facility]    Report Status 05/06/2015 FINAL  Final     Studies: No results found.  Scheduled Meds: . albuterol  2.5 mg Nebulization Q6H  . antiseptic oral rinse  7 mL Mouth Rinse BID  . aspirin EC  81 mg Oral Daily  . atorvastatin  80 mg Oral Daily  . benazepril  40 mg Oral Daily  . budesonide-formoterol  2 puff Inhalation BID  . citalopram  20 mg Oral Daily  . enoxaparin (LOVENOX) injection  40 mg Subcutaneous Q24H  . levofloxacin  750 mg Oral Q24H  . methylPREDNISolone (SOLU-MEDROL) injection  40 mg Intravenous Q12H  . metoprolol succinate  12.5 mg Oral BID  . nicotine  7 mg Transdermal Daily  . potassium chloride SA  20 mEq Oral Daily  . sodium chloride  3 mL Intravenous Q12H  . tiotropium  18 mcg Inhalation Daily   Continuous Infusions:   Principal Problem:   Acute on chronic respiratory failure with hypoxia (HCC) Active Problems:   Abdominal aortic aneurysm (HCC)   COPD exacerbation (HCC)   Chronic pain syndrome   Dyspnea   Chronic systolic CHF (congestive heart failure) (HCC)   Protein-calorie malnutrition, severe    Time spent: 25 minutes    Kathie Dike, MD  [facility] Pager [phone]. If 7PM-7AM, please contact night-coverage at [email or web address], password [facility] 05/08/2015, 7:29 AM  LOS: 6 days     By signing my name below, I, Rennis Harding, attest that this documentation has been prepared under the direction and in the presence of Kathie Dike, MD. Electronically signed: Rennis Harding, Scribe. 05/08/2015 12:02pm  I, Dr.  Kathie Dike, personally performed the services described in this documentaiton. All medical record entries made by the scribe were at my direction and in my presence. I have reviewed the chart and agree that the record reflects my personal performance and is accurate and complete  Kathie Dike, MD, 05/08/2015 12:17 PM

## 2015-05-08 NOTE — Progress Notes (Signed)
Subjective: He says he feels back to baseline except he is very weak. His breathing feels okay. He has not used BiPAP in the last 36 hours or so.  Objective: Vital signs in last 24 hours: Temp:  [96.7 F (35.9 C)-98.1 F (36.7 C)] 97.6 F (36.4 C) (01/01 0612) Pulse Rate:  [54-76] 57 (01/01 0745) Resp:  [17-24] 18 (01/01 0745) BP: (90-146)/(46-61) 146/46 mmHg (01/01 0612) SpO2:  [92 %-100 %] 93 % (01/01 0745) Weight change:  Last BM Date: 05/03/15  Intake/Output from previous day: 12/31 0701 - 01/01 0700 In: 720 [P.O.:720] Out: -   PHYSICAL EXAM General appearance: alert, cooperative and no distress Resp: Diminished breath sounds bilaterally and wheezing on the left Cardio: regular rate and rhythm, S1, S2 normal, no murmur, click, rub or gallop GI: soft, non-tender; bowel sounds normal; no masses,  no organomegaly Extremities: extremities normal, atraumatic, no cyanosis or edema  Lab Results:  No results found for this or any previous visit (from the past 48 hour(s)).  ABGS  Recent Labs  05/05/15 1833  PHART 7.454*  PO2ART 51.7*  HCO3 45.5*   CULTURES Recent Results (from the past 240 hour(s))  MRSA PCR Screening     Status: None   Collection Time: 05/04/15 10:26 AM  Result Value Ref Range Status   MRSA by PCR NEGATIVE NEGATIVE Final    Comment:        The GeneXpert MRSA Assay (FDA approved for NASAL specimens only), is one component of a comprehensive MRSA colonization surveillance program. It is not intended to diagnose MRSA infection nor to guide or monitor treatment for MRSA infections.   Urine culture     Status: None   Collection Time: 05/04/15  4:00 PM  Result Value Ref Range Status   Specimen Description URINE, CLEAN CATCH  Final   Special Requests NONE  Final   Culture   Final    NO GROWTH 1 DAY Performed at [facility]    Report Status 05/06/2015 FINAL  Final   Studies/Results: No results found.  Medications:  Prior to  Admission:  Prescriptions prior to admission  Medication Sig Dispense Refill Last Dose  . albuterol (PROVENTIL) (2.5 MG/3ML) 0.083% nebulizer solution USE 1 VIAL IN NEBULIZER EVERY 4 HOURS AS NEEDED (Patient taking differently: USE 1 VIAL IN NEBULIZER EVERY 4 HOURS AS NEEDED WHEEZING.) 150 mL PRN 05/01/2015 at Unknown time  . ALPRAZolam (XANAX) 1 MG tablet Take 0.5-1 mg by mouth 3 (three) times daily as needed for anxiety (Takes one-half tablet in the morning and afternoon. takes one tablet at bedtime daily).   Past Month at Unknown time  . aspirin 81 MG tablet Take 81 mg by mouth daily.   05/01/2015 at Unknown time  . atorvastatin (LIPITOR) 80 MG tablet TAKE 1 TABLET BY MOUTH DAILY. 30 tablet 6 05/01/2015 at Unknown time  . benazepril (LOTENSIN) 40 MG tablet TAKE 1 TABLET BY MOUTH DAILY. 90 tablet 0 05/01/2015 at Unknown time  . budesonide-formoterol (SYMBICORT) 160-4.5 MCG/ACT inhaler Inhale 2 puffs into the lungs 2 (two) times daily. 1 Inhaler 0 05/01/2015 at Unknown time  . citalopram (CELEXA) 20 MG tablet 1/2 qd 45 tablet 3 05/01/2015 at Unknown time  . furosemide (LASIX) 40 MG tablet TAKE 1 TABLET BY MOUTH 2 TIMES DAILY. 60 tablet 0 05/01/2015 at Unknown time  . HYDROcodone-acetaminophen (NORCO) 10-325 MG tablet Take 1 tablet by mouth 2 (two) times daily as needed (pain). 60 tablet 0 Past Week at Unknown time  .  metoprolol succinate (TOPROL-XL) 25 MG 24 hr tablet Take 0.5 tablets (12.5 mg total) by mouth 2 (two) times daily. 90 tablet 3 05/01/2015 at 1800  . nitroGLYCERIN (NITROSTAT) 0.4 MG SL tablet Place 1 tablet (0.4 mg total) under the tongue every 5 (five) minutes as needed for chest pain. 100 tablet 0 UNKNOWN  . potassium chloride SA (K-DUR,KLOR-CON) 20 MEQ tablet TAKE 1 TABLET BY MOUTH DAILY. 90 tablet 1 05/01/2015 at Unknown time  . tamsulosin (FLOMAX) 0.4 MG CAPS capsule Take 1 capsule (0.4 mg total) by mouth daily. (Patient taking differently: Take 0.4 mg by mouth daily as needed (slow  urination). ) 30 capsule 2 unknown  . tiotropium (SPIRIVA HANDIHALER) 18 MCG inhalation capsule PLACE 1 CAPSULE INTO INHALER AND INHALE DAILY 90 capsule 1 05/01/2015 at Unknown time  . VENTOLIN HFA 108 (90 BASE) MCG/ACT inhaler INHALE 2 PUFFS BY MOUTH EVERY 4-6 HOURS AS NEEDED 18 g 5 05/02/2015 at Unknown time  . benazepril (LOTENSIN) 20 MG tablet Take 1 tablet (20 mg total) by mouth daily. (Patient not taking: Reported on 05/02/2015) 90 tablet 3 Not Taking at Unknown time  . UNABLE TO FIND Place 2 L into the nose continuous. Oxygen 2 liters.   Taking   Scheduled: . albuterol  2.5 mg Nebulization Q6H  . antiseptic oral rinse  7 mL Mouth Rinse BID  . aspirin EC  81 mg Oral Daily  . atorvastatin  80 mg Oral Daily  . benazepril  40 mg Oral Daily  . budesonide-formoterol  2 puff Inhalation BID  . citalopram  20 mg Oral Daily  . enoxaparin (LOVENOX) injection  40 mg Subcutaneous Q24H  . levofloxacin  750 mg Oral Q24H  . methylPREDNISolone (SOLU-MEDROL) injection  40 mg Intravenous Q12H  . metoprolol succinate  12.5 mg Oral BID  . nicotine  7 mg Transdermal Daily  . potassium chloride SA  20 mEq Oral Daily  . sodium chloride  3 mL Intravenous Q12H  . tiotropium  18 mcg Inhalation Daily   Continuous:  [id number], ondansetron **OR** ondansetron (ZOFRAN) IV, tamsulosin  Assesment: He has acute on chronic hypoxic/hypercapnic respiratory failure. He has improved. At baseline he has COPD and uses home oxygen.  He is deconditioned and weak  He has chronic systolic heart failure which seems to be improved  He has severe protein calorie malnutrition on treatment Principal Problem:   Acute on chronic respiratory failure with hypoxia (HCC) Active Problems:   Abdominal aortic aneurysm (HCC)   COPD exacerbation (HCC)   Chronic pain syndrome   Dyspnea   Chronic systolic CHF (congestive heart failure) (HCC)   Protein-calorie malnutrition, severe    Plan: Continue current treatments. He is  improving. I will plan to follow somewhat more peripherally.    LOS: 6 days   Starasia Sinko L 05/08/2015, 11:45 AM

## 2015-05-09 LAB — CBC
HCT: 48.8 % (ref 39.0–52.0)
HEMOGLOBIN: 15.1 g/dL (ref 13.0–17.0)
MCH: 31.1 pg (ref 26.0–34.0)
MCHC: 30.9 g/dL (ref 30.0–36.0)
MCV: 100.6 fL — ABNORMAL HIGH (ref 78.0–100.0)
Platelets: 87 10*3/uL — ABNORMAL LOW (ref 150–400)
RBC: 4.85 MIL/uL (ref 4.22–5.81)
RDW: 12.7 % (ref 11.5–15.5)
WBC: 11.3 10*3/uL — AB (ref 4.0–10.5)

## 2015-05-09 LAB — BASIC METABOLIC PANEL
Anion gap: 5 (ref 5–15)
BUN: 22 mg/dL — ABNORMAL HIGH (ref 6–20)
CALCIUM: 9.4 mg/dL (ref 8.9–10.3)
CO2: 44 mmol/L — ABNORMAL HIGH (ref 22–32)
CREATININE: 0.46 mg/dL — AB (ref 0.61–1.24)
Chloride: 88 mmol/L — ABNORMAL LOW (ref 101–111)
GFR calc non Af Amer: >60 mL/min (ref >60)
Glucose, Bld: 177 mg/dL — ABNORMAL HIGH (ref 65–99)
Potassium: 4.4 mmol/L (ref 3.5–5.1)
SODIUM: 137 mmol/L (ref 135–145)

## 2015-05-09 LAB — BLOOD GAS, ARTERIAL
Acid-Base Excess: 18 mmol/L — ABNORMAL HIGH (ref 0.0–2.0)
BICARBONATE: 39 meq/L — AB (ref 20.0–24.0)
DRAWN BY: 277331
O2 CONTENT: 4 L/min
O2 SAT: 84.8 %
PATIENT TEMPERATURE: 37
PO2 ART: 52.7 mmHg — AB (ref 80.0–100.0)
pCO2 arterial: 68.6 mmHg (ref 35.0–45.0)
pH, Arterial: 7.42 (ref 7.350–7.450)

## 2015-05-09 LAB — CREATININE, SERUM
Creatinine, Ser: 0.51 mg/dL — ABNORMAL LOW (ref 0.61–1.24)
GFR calc Af Amer: >60 mL/min (ref >60)
GFR calc non Af Amer: >60 mL/min (ref >60)

## 2015-05-09 MED ORDER — ENSURE ENLIVE PO LIQD: 237.0000 mL | Freq: Two times a day (BID) | ORAL | Status: DC

## 2015-05-09 NOTE — Discharge Summary (Signed)
Physician Discharge Summary  Tommy Turner [id number] DOB: 01/03/1941 DOA: 05/02/2015  PCP: Sallee Lange, MD  Admit date: 05/02/2015 Discharge date: 05/10/2015  Time spent: 35 minutes  Recommendations for Outpatient Follow-up:  1. Follow up with PCP within 1-2 weeks 2. Discharge to SNF.    Discharge Diagnoses:  Principal Problem:   Acute on chronic respiratory failure with hypoxia (HCC) Active Problems:   Abdominal aortic aneurysm (HCC)   COPD exacerbation (HCC)   Chronic pain syndrome   Dyspnea   Chronic systolic CHF (congestive heart failure) (HCC)   Protein-calorie malnutrition, severe   Discharge Condition: Improved  Diet recommendation: Heart healthy   Filed Weights   05/05/15 0500 05/06/15 0500 05/07/15 0400  Weight: 56.9 kg (125 lb 7.1 oz) 54.7 kg (120 lb 9.5 oz) 54.4 kg (119 lb 14.9 oz)    History of present illness:  51 yom with past medical history of CAD s/p CABG, CHF, HTN, and COPD, on 2L and tobacco use presented with complaints of increasing shortness of breath over two days. While in the ED, he was given IV lasix and steroids. He was admitted for COPD exacerbation.   Hospital Course:  On admission patient was noted to have increased shortness of breath and respiratory efforts. ABG revealed respiratory acidosis with severe hypercapnia (PCO2 of 144). 12/28 and he was placed on BiPAP and transferred to ICU. While on BiPAP respiratory status and CO2 improved and he was able to tolerate 3L [place]. Patient was on BiPAP for several days which was then changed to QHS and has since been discontinued. Follow up ABGs show a stable PCO2, despite not wearing BiPAP. On exertion he is noted to need an increase in oxygen to 5-6L. Pulmonology consulted and agreed with treatment plan. He has been started on a Prednisone taper and was treated with a course of abx while in the hospital. He still has increased oxygen requirement from baseline, but this might be his new baseline. Patient  appears stable for discharge.  1. COPD exacerbation, stable. He has completed a course of abx and placed on Prednisone taper.  2. Chronic systolic CHF. Compensated. ECHO results as below. Resume lasix as outpatient. Continued beta blocker, ASA, statin, and ACE-I. 3. Ongoing tobacco use. Counseled on importance of tobacco cessation.  4. Severe protein calorie malnutrition. Nutrition followed. 5. CAD with history of AAA, stable on home medications. No complaints of chest pain.  6. Chronic thrombocytopenia, remained stable. Conitnue to follow as outpatient.  7. HLD. Continue statin.  8. Essential HTN. Continue home medications.  9. Generalized weakness. Pt consulted and recommended SNF  Procedures:  ECHO Study Conclusions  - Left ventricle: LVEF is approximately 20 to 25% with severe hypokinesis of the lateral wall; akinenesis of the distal lateral, distal septal, apical, mid/distal inferior , distal anteior walls False tendon seen in LV. The cavity size was normal. Wall thickness was normal. Doppler parameters are consistent with abnormal left ventricular relaxation (grade 1 diastolic dysfunction). - Mitral valve: Calcified annulus. Mildly thickened leaflets . - Left atrium: The atrium was moderately to severely dilated. - Right ventricle: The cavity size was mildly dilated. Systolic function was moderately reduced. - Right atrium: The atrium was moderately dilated.  Consultations:  Pulmonology   Discharge Exam: Filed Vitals:   05/10/15 0523 05/10/15 0842  BP: 113/59   Pulse: 71 72  Temp: 98.2 F (36.8 C)   Resp: 20 20     General: NAD, looks comfortable  Cardiovascular: RRR, S1, S2  Respiratory: No wheezing, fair air movement bilaterally.   Abdomen: soft, non tender, no distention , bowel sounds normal  Musculoskeletal: No edema b/l  Discharge Instructions   Discharge Instructions    Diet - low sodium heart healthy    Complete by:  As  directed      Increase activity slowly    Complete by:  As directed           Current Discharge Medication List    START taking these medications   Details  predniSONE (DELTASONE) 20 MG tablet take 40mg  po daily for 3 days then 30mg  daily for 3 days then 20mg  daily for 3 days then 10mg  daily for 3 days then stop      CONTINUE these medications which have CHANGED   Details  furosemide (LASIX) 40 MG tablet Take 1 tablet (40 mg total) by mouth daily as needed for fluid. Qty: 60 tablet, Refills: 0      CONTINUE these medications which have NOT CHANGED   Details  albuterol (PROVENTIL) (2.5 MG/3ML) 0.083% nebulizer solution USE 1 VIAL IN NEBULIZER EVERY 4 HOURS AS NEEDED Qty: 150 mL, Refills: PRN    aspirin 81 MG tablet Take 81 mg by mouth daily.    atorvastatin (LIPITOR) 80 MG tablet TAKE 1 TABLET BY MOUTH DAILY. Qty: 30 tablet, Refills: 6    benazepril (LOTENSIN) 40 MG tablet TAKE 1 TABLET BY MOUTH DAILY. Qty: 90 tablet, Refills: 0    budesonide-formoterol (SYMBICORT) 160-4.5 MCG/ACT inhaler Inhale 2 puffs into the lungs 2 (two) times daily. Qty: 1 Inhaler, Refills: 0    citalopram (CELEXA) 20 MG tablet 1/2 qd Qty: 45 tablet, Refills: 3    metoprolol succinate (TOPROL-XL) 25 MG 24 hr tablet Take 0.5 tablets (12.5 mg total) by mouth 2 (two) times daily. Qty: 90 tablet, Refills: 3    nitroGLYCERIN (NITROSTAT) 0.4 MG SL tablet Place 1 tablet (0.4 mg total) under the tongue every 5 (five) minutes as needed for chest pain. Qty: 100 tablet, Refills: 0    potassium chloride SA (K-DUR,KLOR-CON) 20 MEQ tablet TAKE 1 TABLET BY MOUTH DAILY. Qty: 90 tablet, Refills: 1    tamsulosin (FLOMAX) 0.4 MG CAPS capsule Take 1 capsule (0.4 mg total) by mouth daily. Qty: 30 capsule, Refills: 2    tiotropium (SPIRIVA HANDIHALER) 18 MCG inhalation capsule PLACE 1 CAPSULE INTO INHALER AND INHALE DAILY Qty: 90 capsule, Refills: 1    VENTOLIN HFA 108 (90 BASE) MCG/ACT inhaler INHALE 2 PUFFS BY  MOUTH EVERY 4-6 HOURS AS NEEDED Qty: 18 g, Refills: 5    UNABLE TO FIND Place 2 L into the nose continuous. Oxygen 2 liters.      STOP taking these medications     ALPRAZolam (XANAX) 1 MG tablet      HYDROcodone-acetaminophen (NORCO) 10-325 MG tablet        No Known Allergies    The results of significant diagnostics from this hospitalization (including imaging, microbiology, ancillary and laboratory) are listed below for reference.    Significant Diagnostic Studies: Dg Chest 2 View  05/02/2015  CLINICAL DATA:  Worsening shortness of breath for the last month. History of COPD and cancer. EXAM: CHEST  2 VIEW COMPARISON:  09/14/2014 and 01/19/2014 radiographs. Abdominal CT 08/13/2014 and 10/21/2014. Chest CT 02/24/2013. FINDINGS: The heart size and mediastinal contours are stable status post CABG. The lungs are chronically hyperinflated with generalized interstitial prominence, unchanged from priors. There is chronic blunting of costophrenic angles. No airspace disease, definite edema  or significant pleural effusion. The bones appear unchanged. IMPRESSION: Stable findings of severe chronic obstructive pulmonary disease. No acute process identified. Electronically Signed   By: Richardean Sale M.D.   On: 05/02/2015 09:28    Microbiology: Recent Results (from the past 240 hour(s))  MRSA PCR Screening     Status: None   Collection Time: 05/04/15 10:26 AM  Result Value Ref Range Status   MRSA by PCR NEGATIVE NEGATIVE Final    Comment:        The GeneXpert MRSA Assay (FDA approved for NASAL specimens only), is one component of a comprehensive MRSA colonization surveillance program. It is not intended to diagnose MRSA infection nor to guide or monitor treatment for MRSA infections.   Urine culture     Status: None   Collection Time: 05/04/15  4:00 PM  Result Value Ref Range Status   Specimen Description URINE, CLEAN CATCH  Final   Special Requests NONE  Final   Culture   Final     NO GROWTH 1 DAY Performed at [facility]    Report Status 05/06/2015 FINAL  Final     Labs: Basic Metabolic Panel:  Recent Labs Lab 05/04/15 0944 05/06/15 0428 05/09/15 0621 05/09/15 1350  NA 141 145  --  137  K 4.3 3.6  --  4.4  CL 89* 91*  --  88*  CO2 49* 47*  --  44*  GLUCOSE 165* 177*  --  177*  BUN 32* 29*  --  22*  CREATININE 0.61 0.59* 0.51* 0.46*  CALCIUM 9.8 10.2  --  9.4    Recent Labs Lab 05/04/15 0944  AMMONIA 102*   CBC:  Recent Labs Lab 05/04/15 0944 05/06/15 0428 05/09/15 1350  WBC 12.9* 11.7* 11.3*  HGB 14.7 14.9 15.1  HCT 49.2 48.0 48.8  MCV 104.2* 101.3* 100.6*  PLT 130* 121* 87*   Cardiac Enzymes: No results for input(s): CKTOTAL, CKMB, CKMBINDEX, TROPONINI in the last 168 hours. BNP: BNP (last 3 results)  Recent Labs  05/02/15 0845  BNP 580.0*     Signed: Kathie Dike, MD  [facility] 05/10/2015, 11:13 AM   By signing my name below, I, Rennis Harding, attest that this documentation has been prepared under the direction and in the presence of Kathie Dike, MD. Electronically signed: Rennis Harding, Scribe. 05/10/2015 10:54am   I, Dr. Kathie Dike, personally performed the services described in this documentaiton. All medical record entries made by the scribe were at my direction and in my presence. I have reviewed the chart and agree that the record reflects my personal performance and is accurate and complete  Kathie Dike, MD, 05/10/2015 11:13 AM

## 2015-05-09 NOTE — NC FL2 (Signed)
[place] LEVEL OF CARE SCREENING TOOL     IDENTIFICATION  Patient Name: Tommy Turner Birthdate: 17-Dec-1940 Sex: male Admission Date (Current Location): 05/02/2015  [facility] and [place] Number:  [employer] and Address:  [place] [address] [place]      Provider Number: [id number]  Attending Physician Name and Address:  Kathie Dike, MD  Relative Name and Phone Number:       Current Level of Care: Hospital Recommended Level of Care: [place] Prior Approval Number:    Date Approved/Denied:   PASRR Number:    Discharge Plan: SNF    Current Diagnoses: Patient Active Problem List   Diagnosis Date Noted  . Protein-calorie malnutrition, severe 05/04/2015  . Acute on chronic respiratory failure with hypoxia ([place]) 05/03/2015  . Chronic systolic CHF (congestive heart failure) ([place]) 05/03/2015  . Leg swelling- Bilateral leg / foot 10/21/2014  . Abdominal aortic aneurysm without rupture ([place]) 09/14/2014  . Dyspnea 06/03/2014  . Elevated PSA 03/09/2014  . Peripheral vascular disease, unspecified ([place]) 01/22/2014  . Chronic pain syndrome 10/27/2013  . Hyperglycemia 04/09/2013  . Acute respiratory failure with hypoxia ([place]) 04/09/2013  . COPD with acute exacerbation ([place]) 04/05/2013  . COPD exacerbation ([place]) 04/05/2013  . Loss of weight 03/18/2013  . Early satiety 03/18/2013  . Dysphagia, unspecified(787.20) 03/18/2013  . COPD (chronic obstructive pulmonary disease) ([facility]) 10/31/2012  . Hyperlipemia 10/31/2012  . CAD (coronary artery disease) 10/31/2012  . Anxiety 10/31/2012  . Abdominal aortic aneurysm ([place]) 02/02/2011  . TOBACCO ABUSE 09/20/2009  . PEPTIC ULCER DISEASE 09/20/2009  . Essential hypertension 09/19/2009  . BRONCHITIS, CHRONIC 09/19/2009    Orientation RESPIRATION BLADDER Height & Weight    Self, Time, Situation, Place  O2 (4 L) External catheter 5\' 7"  (170.2 cm) 119 lbs.   BEHAVIORAL SYMPTOMS/MOOD NEUROLOGICAL BOWEL NUTRITION STATUS      Continent Diet (heart healthy)  AMBULATORY STATUS COMMUNICATION OF NEEDS Skin   Limited Assist Verbally Normal                       Personal Care Assistance Level of Assistance  Bathing, Dressing Bathing Assistance: Limited assistance   Dressing Assistance: Limited assistance     Functional Limitations Info             SPECIAL CARE FACTORS FREQUENCY  PT (By licensed PT), OT (By licensed OT)     PT Frequency: 5 OT Frequency: 5            Contractures      Additional Factors Info  Code Status, Allergies Code Status Info: FULL CODE Allergies Info: NKDA           Current Medications (05/09/2015):  This is the current hospital active medication list Current Facility-Administered Medications  Medication Dose Route Frequency Provider Last Rate Last Dose  . albuterol (PROVENTIL) (2.5 MG/3ML) 0.083% nebulizer solution 2.5 mg  2.5 mg Nebulization Q6H Orvan Falconer, MD   2.5 mg at 05/09/15 0834  . albuterol (PROVENTIL) (2.5 MG/3ML) 0.083% nebulizer solution 2.5 mg  2.5 mg Nebulization Q2H PRN Orvan Falconer, MD      . antiseptic oral rinse (CPC / CETYLPYRIDINIUM CHLORIDE 0.05%) solution 7 mL  7 mL Mouth Rinse BID Orvan Falconer, MD   7 mL at 05/09/15 1000  . aspirin EC tablet 81 mg  81 mg Oral Daily Orvan Falconer, MD   81 mg at 05/09/15 1055  .  atorvastatin (LIPITOR) tablet 80 mg  80 mg Oral Daily Orvan Falconer, MD   80 mg at 05/09/15 1056  . benazepril (LOTENSIN) tablet 40 mg  40 mg Oral Daily Orvan Falconer, MD   40 mg at 05/08/15 0848  . budesonide-formoterol (SYMBICORT) 160-4.5 MCG/ACT inhaler 2 puff  2 puff Inhalation BID Orvan Falconer, MD   2 puff at 05/09/15 0831  . citalopram (CELEXA) tablet 20 mg  20 mg Oral Daily Orvan Falconer, MD   20 mg at 05/09/15 1056  . enoxaparin (LOVENOX) injection 40 mg  40 mg Subcutaneous Q24H Orvan Falconer, MD   40 mg at 05/08/15 1626  . metoprolol succinate (TOPROL-XL) 24 hr tablet 12.5 mg  12.5 mg Oral BID Orvan Falconer, MD   12.5 mg at 05/08/15 2141  . nicotine (NICODERM CQ - dosed in mg/24 hr) patch 7 mg  7 mg Transdermal Daily Nishant Dhungel, MD   7 mg at 05/08/15 0850  . ondansetron (ZOFRAN) tablet 4 mg  4 mg Oral Q6H PRN Orvan Falconer, MD       Or  . ondansetron [facility]) injection 4 mg  4 mg Intravenous Q6H PRN Orvan Falconer, MD      . potassium chloride SA (K-DUR,KLOR-CON) CR tablet 20 mEq  20 mEq Oral Daily Orvan Falconer, MD   20 mEq at 05/09/15 1055  . predniSONE (DELTASONE) tablet 60 mg  60 mg Oral Q breakfast Kathie Dike, MD   60 mg at 05/09/15 0804  . sodium chloride 0.9 % injection 3 mL  3 mL Intravenous Q12H Orvan Falconer, MD   3 mL at 05/09/15 1000  . tamsulosin (FLOMAX) capsule 0.4 mg  0.4 mg Oral Daily PRN Orvan Falconer, MD      . tiotropium [facility]) inhalation capsule 18 mcg  18 mcg Inhalation Daily Orvan Falconer, MD   18 mcg at 05/09/15 0831     Discharge Medications: Please see discharge summary for a list of discharge medications.  Relevant Imaging Results:  Relevant Lab Results:   Additional Information SSN [id number]  Ludwig Clarks, LCSW

## 2015-05-09 NOTE — Plan of Care (Signed)
Problem: Acute Rehab PT Goals(only PT should resolve) Goal: Patient Will Transfer Sit To/From Stand Pt will transfer sit to/from-stand with SPC at [place] without loss-of-balance to demonstrate good safety awareness for independent mobility in home.     Goal: Pt Will Ambulate Pt will ambulate with SPC and 2L O2 at [place] maintaining SaO2> 89% for a distances greater than 284ft to demonstrate the ability to perform safe household distance ambulation at discharge.    Goal: Pt Will Go Up/Down Stairs Pt will ascend/descend 3 stairs with LRAD and 2 handrails at [place] to demonstrate safe entry/exit of home.

## 2015-05-09 NOTE — Progress Notes (Signed)
[facility] PROGRESS NOTE  Tommy Turner [id number] DOB: 03-25-1941 DOA: 05/02/2015 PCP: Sallee Lange, MD  Assessment/Plan: 1. Acute on chronic hypercapnic respiratory failure, CO2 narcosis, secondary to COPD exacerbation. Respiratory status remains stable on 4L [place] at rest but noted to desat while ambulating. Remains stable off BiPAP. Appreciate pulmonology recommendations. Encourage pulmonary hygiene. Will repeat ABG and CBC. May need to increase oxygen to 5-6L on exertion. 2. COPD exacerbation, stable. Patient has competed a course of abx. Will continue Prednisone taper and continue nebs.  3. Chronic systolic CHF. Appears compensated. Resume lasix as outpatient. He is on a beta blocker.   4. Ongoing tobacco use. Counseled on importance of tobacco cessation.  5. Severe protein calorie malnutrition. Nutrition is following. 6. CAD with history of AAA, stable on home medications. No complaints of chest pain.  7. Chronic thrombocytopenia, remains stable. Conitnue to follow as outpatient.  8. HLD. Continue statin.  9. Essential HTN. Continue home medications.  10. Generalized weakness. Pt has consulted and recommends SNF.   Code Status: Full DVT prophylaxis: Lovenox   Family Communication: Family bedside.  Disposition Plan: Anticipate discharge in 24 hours pending [facility] availability     Consultants:  Pulmonology   Procedures:  ECHO Study Conclusions  - Left ventricle: LVEF is approximately 20 to 25% with severe hypokinesis of the lateral wall; akinenesis of the distal lateral, distal septal, apical, mid/distal inferior , distal anteior walls False tendon seen in LV. The cavity size was normal. Wall thickness was normal. Doppler parameters are consistent with abnormal left ventricular relaxation (grade 1 diastolic dysfunction). - Mitral valve: Calcified annulus. Mildly thickened leaflets . - Left atrium: The atrium was moderately to severely dilated. -  Right ventricle: The cavity size was mildly dilated. Systolic function was moderately reduced. - Right atrium: The atrium was moderately dilated.  Antibiotics:  Levaquin 12/26>>1/2  HPI/Subjective: Is doing well. Mild productive cough. Denies wheezing. He thinks  PT session went well. Family bedside reports mild confusion at times.   Objective: Filed Vitals:   05/08/15 2335 05/09/15 0551  BP: 106/50 107/58  Pulse: 75 68  Temp: 97.9 F (36.6 C) 98 F (36.7 C)  Resp: 20 20    Intake/Output Summary (Last 24 hours) at 05/09/15 0720 Last data filed at 05/09/15 0551  Gross per 24 hour  Intake    720 ml  Output    600 ml  Net    120 ml   Filed Weights   05/05/15 0500 05/06/15 0500 05/07/15 0400  Weight: 56.9 kg (125 lb 7.1 oz) 54.7 kg (120 lb 9.5 oz) 54.4 kg (119 lb 14.9 oz)    Exam:  General: NAD. Appears calm and looks comfortable.  Cardiovascular: RRR, S1, S2  Respiratory: Breath sounds diminished bilaterally  Abdomen: soft, non tender, no distention , bowel sounds normal Musculoskeletal: No edema b/l  Data Reviewed: Basic Metabolic Panel:  Recent Labs Lab 05/02/15 0845 05/03/15 0637 05/04/15 0944 05/06/15 0428 05/09/15 0621  NA 137 139 141 145  --   K 4.1 3.6 4.3 3.6  --   CL 88* 88* 89* 91*  --   CO2 39* 45* 49* 47*  --   GLUCOSE 152* 151* 165* 177*  --   BUN 15 16 32* 29*  --   CREATININE 0.58* 0.41* 0.61 0.59* 0.51*  CALCIUM 9.5 9.4 9.8 10.2  --     Recent Labs Lab 05/04/15 0944  AMMONIA 102*   CBC:  Recent Labs Lab 05/02/15 0845  05/03/15 0637 05/04/15 0944 05/06/15 0428  WBC 10.1 14.6* 12.9* 11.7*  NEUTROABS 8.9*  --   --   --   HGB 14.9 14.3 14.7 14.9  HCT 46.7 45.9 49.2 48.0  MCV 100.2* 100.4* 104.2* 101.3*  PLT 106* 111* 130* 121*   Cardiac Enzymes:  Recent Labs Lab 05/02/15 0845  TROPONINI 0.03   BNP (last 3 results)  Recent Labs  05/02/15 0845  BNP 580.0*    Recent Results (from the past 240 hour(s))  MRSA PCR  Screening     Status: None   Collection Time: 05/04/15 10:26 AM  Result Value Ref Range Status   MRSA by PCR NEGATIVE NEGATIVE Final    Comment:        The GeneXpert MRSA Assay (FDA approved for NASAL specimens only), is one component of a comprehensive MRSA colonization surveillance program. It is not intended to diagnose MRSA infection nor to guide or monitor treatment for MRSA infections.   Urine culture     Status: None   Collection Time: 05/04/15  4:00 PM  Result Value Ref Range Status   Specimen Description URINE, CLEAN CATCH  Final   Special Requests NONE  Final   Culture   Final    NO GROWTH 1 DAY Performed at [facility]    Report Status 05/06/2015 FINAL  Final     Studies: No results found.  Scheduled Meds: . albuterol  2.5 mg Nebulization Q6H  . antiseptic oral rinse  7 mL Mouth Rinse BID  . aspirin EC  81 mg Oral Daily  . atorvastatin  80 mg Oral Daily  . benazepril  40 mg Oral Daily  . budesonide-formoterol  2 puff Inhalation BID  . citalopram  20 mg Oral Daily  . enoxaparin (LOVENOX) injection  40 mg Subcutaneous Q24H  . metoprolol succinate  12.5 mg Oral BID  . nicotine  7 mg Transdermal Daily  . potassium chloride SA  20 mEq Oral Daily  . predniSONE  60 mg Oral Q breakfast  . sodium chloride  3 mL Intravenous Q12H  . tiotropium  18 mcg Inhalation Daily   Continuous Infusions:   Principal Problem:   Acute on chronic respiratory failure with hypoxia (HCC) Active Problems:   Abdominal aortic aneurysm (HCC)   COPD exacerbation (HCC)   Chronic pain syndrome   Dyspnea   Chronic systolic CHF (congestive heart failure) (HCC)   Protein-calorie malnutrition, severe    Time spent: 25 minutes    Kathie Dike, MD  [facility] Pager [phone]. If 7PM-7AM, please contact night-coverage at [email or web address], password [facility] 05/09/2015, 7:20 AM  LOS: 7 days     By signing my name below, I, Rennis Harding, attest that this documentation  has been prepared under the direction and in the presence of Kathie Dike, MD. Electronically signed: Rennis Harding, Scribe. 05/09/2015 12:45pm  I, Dr. Kathie Dike, personally performed the services described in this documentaiton. All medical record entries made by the scribe were at my direction and in my presence. I have reviewed the chart and agree that the record reflects my personal performance and is accurate and complete  Kathie Dike, MD, 05/09/2015 12:57 PM

## 2015-05-09 NOTE — Progress Notes (Addendum)
CRITICAL VALUE ALERT  Critical value received: pCO2 is 68.6  Date of notification: 05/09/2015  Time of notification:  15:45  Critical value read back: yes  Nurse who received alert:  Threasa Alpha RN  MD notified (1st page):  Memon  Time of first page:  15:47  Received response 15:50- Received no orders. Will continue to monitor patient.

## 2015-05-09 NOTE — Evaluation (Signed)
Physical Therapy Evaluation Patient Details Name: Tommy Turner MRN: [id number] DOB: 1941-04-25 Today's Date: 05/09/2015   History of Present Illness  75yo white male c PMH: COPD (2L at baseline), s/p CABG, AAA s/p vascular surgery (May [id number]), and systolic CHF, comes to [facility] on 12/26 after 2 days worsening SOB.   Clinical Impression  Pt is received semirecumbent in bed upon entry, somnolent but willing to participate. No acute distress noted. Pt is hypoactive, with delayed response to questioning, but pleasant. Wife reports one pt fall in the last 6 months, while working in yard without O2. Pt strength as screened by functional mobility assessment demonstrates substantial weakness with transfers and gait, grip strength also quite limited. Pt remaining on 4L O2 throughout evaluation, but desaturating with activity (79%) denying dyspnea, and with very poor recovery, only at 86% at 2 minutes of sitting with focused breathing effort. Patient presenting with impairment of strength, range of motion, balance, oxygen perfusion, and activity tolerance, limiting ability to perform ADL and mobility tasks at  baseline level of function. Patient will benefit from skilled intervention to address the above impairments and limitations, in order to restore to prior level of function, improve patient safety upon discharge, and to decrease falls risk.       Follow Up Recommendations SNF    Equipment Recommendations  None recommended by PT    Recommendations for Other Services       Precautions / Restrictions Precautions Precaution Comments: no falls score available at eval Restrictions Weight Bearing Restrictions: No Other Position/Activity Restrictions: "up with assistance"      Mobility  Bed Mobility Overal bed mobility: Modified Independent Bed Mobility: Supine to Sit     Supine to sit: HOB elevated;Supervision        Transfers Overall transfer level: Needs assistance Equipment used: Rolling  walker (2 wheeled) Transfers: Sit to/from Stand Sit to Stand: Min guard         General transfer comment: very slow and labored, requires maximal effort.   Ambulation/Gait Ambulation/Gait assistance: Min guard Ambulation Distance (Feet): 32 Feet (resting break between two bouts of 16 feet, desaturation to 79&, requires 2 minutes to recover to 86%. ) Assistive device: Rolling walker (2 wheeled) Gait Pattern/deviations: Decreased step length - left;Decreased step length - right;Wide base of support;Trunk flexed   Gait velocity interpretation: <1.8 ft/sec, indicative of risk for recurrent falls    Stairs Stairs:  (not appropriate at this time. )          Wheelchair Mobility    Modified Rankin (Stroke Patients Only)       Balance Overall balance assessment: History of Falls;Modified Independent (No LOB with mobility or sharpened rhomberg; 1 LOB over the summer.) Sitting-balance support: No upper extremity supported Sitting balance-Leahy Scale: Good     Standing balance support: Bilateral upper extremity supported;During functional activity Standing balance-Leahy Scale: Fair               High level balance activites: Backward walking;Side stepping               Pertinent Vitals/Pain Pain Assessment: No/denies pain    Home Living Family/patient expects to be discharged to:: Skilled nursing facility Living Arrangements: Spouse/significant other Available Help at Discharge: Family Type of Home: Mobile home Home Access: Stairs to enter Entrance Stairs-Rails: Can reach both Entrance Stairs-Number of Steps: 3 Home Layout: One level Home Equipment: Cane - single point (equipment)      Prior Function Level of Independence: Needs assistance  Gait / Transfers Assistance Needed: household ambulation c SPC with modified independence; requires minA 25% of time due to fluctuating functional status.   ADL's / Homemaking Assistance Needed: Wife assists with  housework and meals preparation.         Hand Dominance   Dominant Hand: Right    Extremity/Trunk Assessment   Upper Extremity Assessment: Generalized weakness           Lower Extremity Assessment: Generalized weakness      Cervical / Trunk Assessment: Kyphotic  Communication   Communication: Expressive difficulties (Pt with slurring of speech, difficult to understand; wife reports a moderate acute change associated with this visit. )  Cognition Arousal/Alertness: Lethargic Behavior During Therapy: Flat affect;WFL for tasks assessed/performed Overall Cognitive Status: Difficult to assess       Memory:  (Wife answers most history taking questions. )              General Comments      Exercises        Assessment/Plan    PT Assessment Patient needs continued PT services  PT Diagnosis Difficulty walking;Generalized weakness;Abnormality of gait   PT Problem List Decreased strength;Decreased knowledge of use of DME;Decreased activity tolerance;Decreased mobility;Cardiopulmonary status limiting activity  PT Treatment Interventions DME instruction;Gait training;Stair training;Functional mobility training;Therapeutic activities;Therapeutic exercise;Balance training;Patient/family education   PT Goals (Current goals can be found in the Care Plan section) Acute Rehab PT Goals Patient Stated Goal: Regain strength and functional mobility.  PT Goal Formulation: With patient/family Time For Goal Achievement: 05/23/15 Potential to Achieve Goals: Fair    Frequency Min 3X/week   Barriers to discharge Inaccessible home environment;Other (comment) Pt does not have a way to monitor SaO2.     Co-evaluation               End of Session Equipment Utilized During Treatment: Gait belt;Oxygen Activity Tolerance: Patient tolerated treatment well;Treatment limited secondary to medical complications (Comment) (Denies any DOE; substantial O2 drop c poor recovery.  ) Patient left: in bed;with call bell/phone within reach;with bed alarm set;with family/visitor present Nurse Communication: Other (comment)         [id number] PT Time Calculation (min) (ACUTE ONLY): 24 min   Charges:   PT Evaluation $Initial PT Evaluation Tier I: 1 Procedure PT Treatments $Therapeutic Activity: 8-22 mins   PT G Codes:        Jashay Roddy C 2015/05/23, 11:10 AM 11:15 AM  Etta Grandchild, PT, DPT [place] License # [id number]

## 2015-05-10 ENCOUNTER — Inpatient Hospital Stay
Admission: RE | Admit: 2015-05-10 | Discharge: 2015-06-01 | Disposition: A | Discharge status: Home / Self Care | Payer: 59 | Source: Ambulatory Visit | Attending: Internal Medicine | Admitting: Internal Medicine

## 2015-05-10 DIAGNOSIS — I9589 Other hypotension: Secondary | ICD-10-CM | POA: Diagnosis not present

## 2015-05-10 DIAGNOSIS — R635 Abnormal weight gain: Secondary | ICD-10-CM | POA: Diagnosis not present

## 2015-05-10 DIAGNOSIS — I959 Hypotension, unspecified: Secondary | ICD-10-CM | POA: Diagnosis not present

## 2015-05-10 DIAGNOSIS — D649 Anemia, unspecified: Secondary | ICD-10-CM | POA: Diagnosis not present

## 2015-05-10 DIAGNOSIS — R262 Difficulty in walking, not elsewhere classified: Secondary | ICD-10-CM | POA: Diagnosis not present

## 2015-05-10 DIAGNOSIS — J9612 Chronic respiratory failure with hypercapnia: Secondary | ICD-10-CM | POA: Diagnosis not present

## 2015-05-10 DIAGNOSIS — J9621 Acute and chronic respiratory failure with hypoxia: Secondary | ICD-10-CM | POA: Diagnosis not present

## 2015-05-10 DIAGNOSIS — J441 Chronic obstructive pulmonary disease with (acute) exacerbation: Secondary | ICD-10-CM | POA: Diagnosis not present

## 2015-05-10 DIAGNOSIS — R260 Ataxic gait: Secondary | ICD-10-CM | POA: Diagnosis not present

## 2015-05-10 DIAGNOSIS — R0989 Other specified symptoms and signs involving the circulatory and respiratory systems: Secondary | ICD-10-CM | POA: Diagnosis not present

## 2015-05-10 DIAGNOSIS — E860 Dehydration: Secondary | ICD-10-CM | POA: Diagnosis not present

## 2015-05-10 DIAGNOSIS — E43 Unspecified severe protein-calorie malnutrition: Secondary | ICD-10-CM | POA: Diagnosis not present

## 2015-05-10 DIAGNOSIS — I255 Ischemic cardiomyopathy: Secondary | ICD-10-CM | POA: Diagnosis not present

## 2015-05-10 DIAGNOSIS — I5022 Chronic systolic (congestive) heart failure: Secondary | ICD-10-CM | POA: Diagnosis not present

## 2015-05-10 DIAGNOSIS — I2510 Atherosclerotic heart disease of native coronary artery without angina pectoris: Secondary | ICD-10-CM | POA: Diagnosis not present

## 2015-05-10 DIAGNOSIS — G894 Chronic pain syndrome: Secondary | ICD-10-CM | POA: Diagnosis not present

## 2015-05-10 DIAGNOSIS — J42 Unspecified chronic bronchitis: Secondary | ICD-10-CM | POA: Diagnosis not present

## 2015-05-10 DIAGNOSIS — M6281 Muscle weakness (generalized): Secondary | ICD-10-CM | POA: Diagnosis not present

## 2015-05-10 DIAGNOSIS — Z029 Encounter for administrative examinations, unspecified: Secondary | ICD-10-CM | POA: Diagnosis present

## 2015-05-10 DIAGNOSIS — R1312 Dysphagia, oropharyngeal phase: Secondary | ICD-10-CM | POA: Diagnosis not present

## 2015-05-10 DIAGNOSIS — J9611 Chronic respiratory failure with hypoxia: Secondary | ICD-10-CM | POA: Diagnosis not present

## 2015-05-10 DIAGNOSIS — R278 Other lack of coordination: Secondary | ICD-10-CM | POA: Diagnosis not present

## 2015-05-10 DIAGNOSIS — I10 Essential (primary) hypertension: Secondary | ICD-10-CM | POA: Diagnosis not present

## 2015-05-10 DIAGNOSIS — R062 Wheezing: Principal | ICD-10-CM

## 2015-05-10 DIAGNOSIS — J449 Chronic obstructive pulmonary disease, unspecified: Secondary | ICD-10-CM | POA: Diagnosis not present

## 2015-05-10 DIAGNOSIS — J90 Pleural effusion, not elsewhere classified: Secondary | ICD-10-CM | POA: Diagnosis not present

## 2015-05-10 DIAGNOSIS — F17200 Nicotine dependence, unspecified, uncomplicated: Secondary | ICD-10-CM | POA: Diagnosis not present

## 2015-05-10 MED ORDER — FUROSEMIDE 40 MG PO TABS: 40.0000 mg | ORAL_TABLET | Freq: Every day | ORAL | Status: DC | PRN

## 2015-05-10 MED ORDER — PREDNISONE 20 MG PO TABS: ORAL_TABLET | ORAL | Status: DC

## 2015-05-10 NOTE — Clinical Social Work Note (Signed)
Clinical Social Work Assessment  Patient Details  Name: Tommy Turner MRN: [id number] Date of Birth: [birth year]  Date of referral:  05/10/15               Reason for consult:  Facility Placement                Permission sought to share information with:    Permission granted to share information::     Name::        Agency::     Relationship::     Contact Information:     Housing/Transportation Living arrangements for the past 2 months:  Single Family Home Source of Information:  Spouse Patient Interpreter Needed:  None Criminal Activity/Legal Involvement Pertinent to Current Situation/Hospitalization:  No - Comment as needed Significant Relationships:  Spouse Lives with:  Spouse Do you feel safe going back to the place where you live?  Yes Need for family participation in patient care:  Yes (Comment)  Care giving concerns: None identified. Wife has been primary caregiver.   Social Worker assessment / plan:  Patient's wife, Tommy Turner, was at bedside.  She stated that patient was independent with  ADLs and ambulating at baseline. She stated that this she desires for patient to go to [facility] for rehab.  She stated that her second choice would be [facility].    Employment status:  Retired Forensic scientist:  Other (Comment Required) ([employer] Psychologist, counselling) PT Recommendations:  [place] / Referral to community resources:  [place]  Patient/Family's Response to care:  Family is agreeable to SNF.  Patient/Family's Understanding of and Emotional Response to Diagnosis, Current Treatment, and Prognosis: Ms. Dimonte understands patient's diagnosis, prognosis and treatment plan.   Emotional Assessment Appearance:  Appears older than stated age Attitude/Demeanor/Rapport:  Unable to Assess Affect (typically observed):  Unable to Assess Orientation:  Oriented to Self, Oriented to Place, Oriented to  Time, Oriented to Situation Alcohol /  Substance use:  Not Applicable Psych involvement (Current and /or in the community):  No (Comment)  Discharge Needs  Concerns to be addressed:  Discharge Planning Concerns Readmission within the last 30 days:  No Current discharge risk:  None Barriers to Discharge:  No Barriers Identified   Ihor Gully, LCSW 05/10/2015, 1:26 PM

## 2015-05-10 NOTE — Clinical Social Work Placement (Signed)
   CLINICAL SOCIAL WORK PLACEMENT  NOTE  Date:  05/10/2015  Patient Details  Name: Tommy Turner MRN: [id number] Date of Birth: [birth year]  Clinical Social Work is seeking post-discharge placement for this patient at the [place] level of care (*CSW will initial, date and re-position this form in  chart as items are completed):  Yes   Patient/family provided with [place] Work Department's list of facilities offering this level of care within the geographic area requested by the patient (or if unable, by the patient's family).  Yes   Patient/family informed of their freedom to choose among providers that offer the needed level of care, that participate in Medicare, Medicaid or managed care program needed by the patient, have an available bed and are willing to accept the patient.  Yes   Patient/family informed of [facility]'s ownership interest in [facility] and [facility], as well as of the fact that they are under no obligation to receive care at these facilities.  PASRR submitted to EDS on 05/10/15     PASRR number received on 05/10/15     Existing PASRR number confirmed on       FL2 transmitted to all facilities in geographic area requested by pt/family on 05/09/15     FL2 transmitted to all facilities within larger geographic area on       Patient informed that his/her managed care company has contracts with or will negotiate with certain facilities, including the following:        Yes   Patient/family informed of bed offers received.  Patient chooses bed at [facility]     Physician recommends and patient chooses bed at      Patient to be transferred to [facility] on 05/10/15.  Patient to be transferred to facility by [facility] staff     Patient family notified on 05/10/15 of transfer.  Name of family member notified:  Perry Mount, Wife     PHYSICIAN       Additional Comment:     _______________________________________________ Ihor Gully, LCSW 05/10/2015, 1:38 PM

## 2015-05-10 NOTE — Care Management Note (Signed)
Case Management Note  Patient Details  Name: [name] MRN: [id number] Date of Birth: 1941-02-22  Subjective/Objective:                    Action/Plan:   Expected Discharge Date:                  Expected Discharge Plan:  Skilled Nursing Facility  In-House Referral:  Clinical Social Work  Discharge planning Services  CM Consult  Post Acute Care Choice:  NA Choice offered to:  NA  DME Arranged:    DME Agency:     HH Arranged:    [place] Agency:     Status of Service:  Completed, signed off  Medicare Important Message Given:  Yes Date Medicare IM Given:    Medicare IM give by:    Date Additional Medicare IM Given:    Additional Medicare Important Message give by:     If discussed at National of Stay Meetings, dates discussed:    Additional Comments: Pt discharged to [facility] today. CSW to arrange discharge to facility. Christinia Gully [place], RN 05/10/2015, 2:19 PM

## 2015-05-10 NOTE — Care Management Important Message (Signed)
Important Message  Patient Details  Name: Tommy Turner MRN: [id number] Date of Birth: [birth year]   Medicare Important Message Given:  Yes    Joylene Draft, RN 05/10/2015, 2:18 PM

## 2015-05-10 NOTE — Progress Notes (Signed)
Pt discharged to [facility]. Called report to [facility] nurse before transporting. Discharged with all belongs.

## 2015-05-11 ENCOUNTER — Non-Acute Institutional Stay (SKILLED_NURSING_FACILITY): Payer: 59 | Admitting: Internal Medicine

## 2015-05-11 DIAGNOSIS — E860 Dehydration: Secondary | ICD-10-CM

## 2015-05-11 DIAGNOSIS — I255 Ischemic cardiomyopathy: Secondary | ICD-10-CM | POA: Diagnosis not present

## 2015-05-11 DIAGNOSIS — J441 Chronic obstructive pulmonary disease with (acute) exacerbation: Secondary | ICD-10-CM

## 2015-05-11 DIAGNOSIS — R260 Ataxic gait: Secondary | ICD-10-CM

## 2015-05-11 DIAGNOSIS — E43 Unspecified severe protein-calorie malnutrition: Secondary | ICD-10-CM

## 2015-05-11 NOTE — Progress Notes (Signed)
Patient ID: Tommy Turner, male   DOB: April 12, 1941, 75 y.o.   MRN: [id number]  Facility; [facility] Chief complaint; admission to SNF post admit to [facility] from 12/26 to 05/09/14  History; this is a 75 year old man with a past history history of coronary artery disease, congestive heart failure and COPD on chronic oxygen. He apparently has continued tobacco abuse. He was admitted with respiratory distress and felt to have a COPD exacerbation. Initial ABG revealed severe hypercapnia with PCO2 of 144. He was treated with BiPAP and treated in the ICU. The patient was on BiPAP for several days then changed to daily at bedtime and has since been discontinued. Follow-up ABGs have been stable in spite of not weaning the BiPAP. On exertion is noted to need an increase in oxygen 5-6 L although I don't see any specific orders for this. He was reviewed by pulmonary. He was placed on a prednisone taper.  With regards to his congestive heart failure this was largely felt to be stable. He had an echocardiogram that shows an left ventricular ejection fraction of 20-25% with severe hypokinesis of the lateral wall akinesis of the distal lateral distal septal apical mid distal inferior and distal anterior walls. Moderately reduced right ventricular function.       CLINICAL DATA: Worsening shortness of breath for the last month. History of COPD and cancer.  EXAM: CHEST 2 VIEW  COMPARISON: 09/14/2014 and 01/19/2014 radiographs. Abdominal CT 08/13/2014 and 10/21/2014. Chest CT 02/24/2013.  FINDINGS: The heart size and mediastinal contours are stable status post CABG. The lungs are chronically hyperinflated with generalized interstitial prominence, unchanged from priors. There is chronic blunting of costophrenic angles. No airspace disease, definite edema or significant pleural effusion. The bones appear unchanged.  IMPRESSION: Stable findings of severe chronic obstructive pulmonary disease. No acute  process identified.   Electronically Signed  By: Richardean Sale M.D.     Results for Tommy Turner, Tommy Turner (MRN [id number]) as of 05/11/2015 09:34  Ref. Range 05/04/2015 09:30 05/04/2015 16:00 05/05/2015 03:54 05/05/2015 18:33 05/09/2015 15:30  Sample type Unknown ARTERIAL ARTERIAL ARTERIAL DRAW ARTERIAL ARTERIAL DRAW  Delivery systems Unknown HI FLOW NASAL CAN... BILEVEL POSITIVE .Marland KitchenMarland Kitchen BILEVEL POSITIVE .Marland KitchenMarland Kitchen NASAL CANNULA NASAL CANNULA  FIO2 Unknown  0.30 0.30    Mode Unknown  BILEVEL POSITIVE .Marland Kitchen.     O2 Content Latest Units: L/min 8.0 30.0  2.0 4.0  Inspiratory PAP Unknown  20 20    Expiratory PAP Unknown  6 6    pH, Arterial Latest Ref Range: 7.350-7.450  7.168 (LL) 7.442 7.456 (H) 7.454 (H) 7.420  pCO2 arterial Latest Ref Range: 35.0-45.0 mmHg 146.0 (HH) 72.2 (HH) 68.4 (HH) 73.1 (HH) 68.6 (HH)  pO2, Arterial Latest Ref Range: 80.0-100.0 mmHg 167.0 (H) 53.6 (L) 58.0 (L) 51.7 (L) 52.7 (L)  Bicarbonate Latest Ref Range: 20.0-24.0 mEq/L 39.0 (H) 43.8 (H) 43.3 (H) 45.5 (H) 39.0 (H)  TCO2 Latest Ref Range: 0-100 mmol/L   17.6    Acid-Base Excess Latest Ref Range: 0.0-2.0 mmol/L 21.7 (H) 22.4 (H) 21.7 (H) 24.3 (H) 18.0 (H)  O2 Saturation Latest Units: % 97.3 87.7 88.5 84.2 84.8  Patient temperature Unknown     37.0  Collection site Unknown LEFT RADIAL ARTERIAL RIGHT RADIAL RIGHT RADIAL RIGHT RADIAL  Allens test (pass/fail) Latest Ref Range: PASS  PASS PASS PASS PASS PASS      BMP Latest Ref Rng 05/09/2015 05/09/2015 05/06/2015  Glucose 65 - 99 mg/dL 177(H) - 177(H)  BUN 6 - 20  mg/dL 22(H) - 29(H)  Creatinine 0.61 - 1.24 mg/dL 0.46(L) 0.51(L) 0.59(L)  Sodium 135 - 145 mmol/L 137 - 145  Potassium 3.5 - 5.1 mmol/L 4.4 - 3.6  Chloride 101 - 111 mmol/L 88(L) - 91(L)  CO2 22 - 32 mmol/L 44(H) - 47(H)  Calcium 8.9 - 10.3 mg/dL 9.4 - 10.2    CBC Latest Ref Rng 05/09/2015 05/06/2015 05/04/2015  WBC 4.0 - 10.5 K/uL 11.3(H) 11.7(H) 12.9(H)  Hemoglobin 13.0 - 17.0 g/dL 15.1 14.9 14.7  Hematocrit 39.0 -  52.0 % 48.8 48.0 49.2  Platelets 150 - 400 K/uL 87(L) 121(L) 130(L)    Past Medical History  Diagnosis Date  . AAA (abdominal aortic aneurysm) ([place])   . Hyperlipidemia     takes Atorvastatin daily  . CAD (coronary artery disease)   . Ulcer     gastric  . Aneurysm ([place])   . Anxiety     takes Xanax daily as needed  . Depression     takes Celexa daily  . Hypertension     takes Metoprolol and Benazepril  daily  . Peripheral edema     takes Furosemide daily  . Myocardial infarction ([place])     20+yrs ago  . COPD (chronic obstructive pulmonary disease) ([facility])     Albuterol inhaler and neb daily as needed;takes SPiriva daily  . History of shingles   . CHF (congestive heart failure) ([place])   . Cancer ([place])   . Shortness of breath      on oxygen  2 liters continuous  . Headache(784.0)     occasionally    Past Surgical History  Procedure Laterality Date  . Lipoma excision      right lower back  . Partial gastrectomy      approx 1990  . Hernia repair Left     inguinal  . Tonsillectomy      as a child  . Esophagogastroduodenoscopy    . Coronary artery bypass graft  20+yrs ago    x 6  . Abdominal aortic endovascular stent graft N/A 09/14/2014    Procedure: ABDOMINAL AORTIC ENDOVASCULAR STENT GRAFT;  Surgeon: Conrad , MD;  Location: [facility] OR;  Service: Vascular;  Laterality: N/A;    Current Outpatient Prescriptions on File Prior to Visit  Medication Sig Dispense Refill  . albuterol (PROVENTIL) (2.5 MG/3ML) 0.083% nebulizer solution USE 1 VIAL IN NEBULIZER EVERY 4 HOURS AS NEEDED (Patient taking differently: USE 1 VIAL IN NEBULIZER EVERY 4 HOURS AS NEEDED WHEEZING.) 150 mL PRN  . aspirin 81 MG tablet Take 81 mg by mouth daily.    Marland Kitchen atorvastatin (LIPITOR) 80 MG tablet TAKE 1 TABLET BY MOUTH DAILY. 30 tablet 6  . benazepril (LOTENSIN) 40 MG tablet TAKE 1 TABLET BY MOUTH DAILY. 90 tablet 0  . budesonide-formoterol (SYMBICORT) 160-4.5 MCG/ACT inhaler Inhale 2 puffs into the lungs 2  (two) times daily. 1 Inhaler 0  . citalopram (CELEXA) 20 MG tablet 1/2 qd 45 tablet 3  . furosemide (LASIX) 40 MG tablet Take 1 tablet (40 mg total) by mouth daily as needed for fluid. 60 tablet 0  . metoprolol succinate (TOPROL-XL) 25 MG 24 hr tablet Take 0.5 tablets (12.5 mg total) by mouth 2 (two) times daily. 90 tablet 3  . nitroGLYCERIN (NITROSTAT) 0.4 MG SL tablet Place 1 tablet (0.4 mg total) under the tongue every 5 (five) minutes as needed for chest pain. 100 tablet 0  . potassium chloride SA (K-DUR,KLOR-CON) 20 MEQ tablet TAKE 1 TABLET  BY MOUTH DAILY. 90 tablet 1  . predniSONE (DELTASONE) 20 MG tablet take 40mg  po daily for 3 days then 30mg  daily for 3 days then 20mg  daily for 3 days then 10mg  daily for 3 days then stop    . tamsulosin (FLOMAX) 0.4 MG CAPS capsule Take 1 capsule (0.4 mg total) by mouth daily. (Patient taking differently: Take 0.4 mg by mouth daily as needed (slow urination). ) 30 capsule 2  . tiotropium (SPIRIVA HANDIHALER) 18 MCG inhalation capsule PLACE 1 CAPSULE INTO INHALER AND INHALE DAILY 90 capsule 1  . UNABLE TO FIND Place 2 L into the nose continuous. Oxygen 2 liters.    . VENTOLIN HFA 108 (90 BASE) MCG/ACT inhaler INHALE 2 PUFFS BY MOUTH EVERY 4-6 HOURS AS NEEDED 18 g 5    Social; patient lives in a mobile home with wife. He has oxygen 2 L at home which he uses chronically. He uses a cane. His wife states that his exercise tolerance is roughly aisle at the grocery store. It sounds as though he is reasonably comfortable in a limited lifestyle. Occasionally will get out and putter around in his workshop.  reports that he has been smoking Cigarettes.  He has a 14.5 pack-year smoking history. He has quit using smokeless tobacco. He reports that he does not drink alcohol or use illicit drugs.  indicated that his mother is deceased. He indicated that his father is deceased. He indicated that his sister is deceased. He indicated that his brother is alive.    Review of  systems Gen. no recent weight loss. Per his wife he was stable up until the time of this COPD exacerbation. HEENT no oral pain or swallowing difficulties no Adina Fae Jett Respiratory; still complaining of cough. He cannot mobilize the sputum. No pleuritic chest pain Cardiac no chest pain Abdomen no abdominal pain nausea vomiting or diarrhea GU has a condom catheter in place, did not use this at home Musculoskeletal; no joint pain Neurologic no focal weakness or numbness Endocrine; he is not a diabetic Skin; per discussion with the nursing staff he has pressure areas over his lower sacrum  Physical examination Gen. gaunt, chronically ill-looking man. Somewhat cachectic with obvious accessory muscle use HEENT; dry mucous membranes no lesions see; none palpable in the cervical clavicular or axillary areas. Lymph  He has a firm node in the left inguina  Area. No lymph nodes were palpable in the cervical clavicular or axillary areas  Respiratory; respiratory rate 26. He has accessory muscle uses tracheal tug, sternocleidomastoids. Markedly reduced air entry bilaterally with upper airway sounds but no audible wheezing Cardiac; heart sounds are normal no S3 JVP is not elevated. If anything he is hypovolemic Abdomen; no liver or spleen no tenderness no masses GU bladder is not distended, there is no CVA tenderness Circulation; peripheral pulses are not palpable at the dorsalis pedis or posterior tibial he does have good popliteal pulses Extremities; no edema no evidence of a DVT Neurologic; his reflexes are brisk at both knee jerks but not pathologic; he does have antigravity strength 4 out of 5 bilaterally in his lower extremities. Skin; stage II pressure ulcers surrounding both sides of his lower sacrum and coccyx these are small and superficial Gait; he is able to bring himself to a standing position with some difficulty he is ataxic I did not attempt to ambulate him  further  Impression/plan #1 COPD acute; his respiratory status is still very tenuous. I am going to interrupt his  prednisone taper and give him routine nebulizers as well as guaifenesin #2 ischemic cardiomyopathy with severely reduced left ventricular ejection fraction fortunately at the bedside this does not seem to be stable. He had a brain natruretic peptide done in the hospital of 580. I'm not sure where this compares with his baseline #3 ongoing tobacco abuse; he is already agitating to return home I counseled against this #4 severe protein calorie malnutrition. He certainly looks hard although his albumin was 3.  #5 chronic thrombocytopenia this does not appear to be clinically consequential at this point. #6 hypertension we will continue to monitor #7 hyperlipidemia continue to monitor on a statin #8 generalized weakness and gait ataxia. Headache this is probably disuse perhaps a component of prednisone myopathy. He will need aggressive physical therapy or at least as aggressive as his cardiopulmonary unit will allow. #9 I'm not a big believer in supplementing oxygen with activity unless he can be shown that this improves the activity. Nevertheless I'll see what I can do to implement this if this becomes a detriment to his ongoing therapy

## 2015-05-16 ENCOUNTER — Encounter (HOSPITAL_COMMUNITY)
Admission: RE | Admit: 2015-05-16 | Discharge: 2015-05-16 | Disposition: A | Discharge status: Home / Self Care | Payer: 59 | Source: Skilled Nursing Facility | Attending: Internal Medicine | Admitting: Internal Medicine

## 2015-05-16 DIAGNOSIS — Z029 Encounter for administrative examinations, unspecified: Secondary | ICD-10-CM | POA: Insufficient documentation

## 2015-05-16 LAB — BRAIN NATRIURETIC PEPTIDE: B NATRIURETIC PEPTIDE 5: 87 pg/mL (ref 0.0–100.0)

## 2015-05-16 LAB — BASIC METABOLIC PANEL
BUN: 31 mg/dL — ABNORMAL HIGH (ref 6–20)
CALCIUM: 9.1 mg/dL (ref 8.9–10.3)
CO2: 41 mmol/L — AB (ref 22–32)
CREATININE: 0.43 mg/dL — AB (ref 0.61–1.24)
Chloride: 96 mmol/L — ABNORMAL LOW (ref 101–111)
Glucose, Bld: 88 mg/dL (ref 65–99)
Potassium: 5 mmol/L (ref 3.5–5.1)
SODIUM: 139 mmol/L (ref 135–145)

## 2015-05-18 ENCOUNTER — Non-Acute Institutional Stay (SKILLED_NURSING_FACILITY): Payer: 59 | Admitting: Internal Medicine

## 2015-05-18 DIAGNOSIS — I255 Ischemic cardiomyopathy: Secondary | ICD-10-CM | POA: Diagnosis not present

## 2015-05-18 DIAGNOSIS — J9612 Chronic respiratory failure with hypercapnia: Secondary | ICD-10-CM | POA: Diagnosis not present

## 2015-05-18 DIAGNOSIS — J9611 Chronic respiratory failure with hypoxia: Secondary | ICD-10-CM | POA: Diagnosis not present

## 2015-05-20 ENCOUNTER — Encounter: Payer: Self-pay | Admitting: Family

## 2015-05-22 NOTE — Progress Notes (Addendum)
Patient ID: Tommy Turner, male   DOB: 10/23/1940, 75 y.o.   MRN: [id number]                PROGRESS NOTE  DATE:  05/18/2015          FACILITY: [facility]                       LEVEL OF CARE:   SNF   Acute Visit               CHIEF COMPLAINT:  Follow up respiratory status.      HISTORY OF PRESENT ILLNESS:  This is a patient whom I admitted to the building a week ago.   He was admitted to hospital with COPD acute.  He has chronic hypercapnia by review of his blood gases.  He also has a significant cardiomyopathy which is ischemic, with an ejection fraction of 20-25%.    The patient states he feels well.   He says he is able to do some therapy.  He is not coughing.  He does not complain of chest pain.    PAST MEDICAL HISTORY/PROBLEM LIST:   Reviewed.      CURRENT MEDICATIONS:  Medication list is reviewed.     Prednisone taper.   Lasix 40 q.d.     Albuterol nebulizers q.4 p.r.n.      ASA 81 q.d.     Lipitor 80 q.d.     Lotensin 40 q.d.     Symbicort 160/4.5, 2 puffs b.i.d.     Celexa  of 20 mg/10 mg daily.   Toprol XL 12.5 daily.   K-Dur 20 q.d.     Nitroglycerin p.r.n.    Spiriva 1 capsule inhaled daily.   Flomax 0.4 q.d.      LABORATORY DATA:   Last blood gas on 05/09/2015 showed a pH of 7.42, pCO2 of 68, O2 of 52.7, and bicarbonate of 39 on 4 L.    Today's lab work:  Sodium 139, potassium 5, chloride 41 (this is actually an improvement), BUN 31, creatinine 0.43.    PHYSICAL EXAMINATION:   VITAL SIGNS:     PULSE:  73.     RESPIRATIONS:  18.    02 SATURATIONS:  95%, oxygen at 2 L.   GENERAL APPEARANCE:  The patient does not look to be in any distress.    CHEST/RESPIRATORY:  Shallow, but otherwise clear air entry bilaterally.    CARDIOVASCULAR:   CARDIAC:  Heart sounds are normal.  He appears to be euvolemic.      GASTROINTESTINAL:   ABDOMEN:  Soft.    LIVER/SPLEEN/KIDNEYS:  No liver, no spleen.  No tenderness.     CIRCULATION:   EDEMA/VARICOSITIES:   Extremities:  Absolutely no edema.       ASSESSMENT/PLAN:              Chronic respiratory failure secondary to COPD.  He is a CO2 retainer with a pCO2 in the high 60s.  His elevated CO2 on basic metabolic panel reflects bicarb compensation for this.  This is actually better.    Potassium at 5.  I am going to discontinue his potassium.  Follow up on his electrolytes next week.   Congestive heart failure, ischemic cardiomyopathy.  He is on Lasix 40.  We will follow up on this next week.  There is no evidence of heart failure currently.

## 2015-05-24 ENCOUNTER — Ambulatory Visit: Payer: 59 | Admitting: Family

## 2015-05-24 ENCOUNTER — Encounter: Payer: Self-pay | Admitting: Internal Medicine

## 2015-05-24 ENCOUNTER — Non-Acute Institutional Stay (SKILLED_NURSING_FACILITY): Payer: 59 | Admitting: Internal Medicine

## 2015-05-24 ENCOUNTER — Inpatient Hospital Stay (HOSPITAL_COMMUNITY)
Admission: RE | Admit: 2015-05-24 | Discharge: 2015-05-24 | Disposition: A | Discharge status: Home / Self Care | Payer: 59 | Source: Ambulatory Visit | Attending: Family | Admitting: Family

## 2015-05-24 DIAGNOSIS — J42 Unspecified chronic bronchitis: Secondary | ICD-10-CM

## 2015-05-24 DIAGNOSIS — I5022 Chronic systolic (congestive) heart failure: Secondary | ICD-10-CM | POA: Insufficient documentation

## 2015-05-24 DIAGNOSIS — Z95828 Presence of other vascular implants and grafts: Secondary | ICD-10-CM

## 2015-05-24 DIAGNOSIS — I9589 Other hypotension: Secondary | ICD-10-CM | POA: Diagnosis not present

## 2015-05-24 DIAGNOSIS — I959 Hypotension, unspecified: Secondary | ICD-10-CM | POA: Insufficient documentation

## 2015-05-24 DIAGNOSIS — F17200 Nicotine dependence, unspecified, uncomplicated: Secondary | ICD-10-CM

## 2015-05-24 DIAGNOSIS — R600 Localized edema: Secondary | ICD-10-CM

## 2015-05-24 NOTE — Progress Notes (Signed)
Patient ID: Tommy Turner, male   DOB: 1940/12/21, 75 y.o.   MRN: [id number]                 PROGRESS NOTE  DATE:  05/24/2015          FACILITY: [facility]                       LEVEL OF CARE:   SNF   Acute Visit               CHIEF COMPLAINT:  Acute visit secondary to low blood pressure     HISTORY OF PRESENT ILLNESS:  This is a patient  admitted to the building about two weeks ago.   He was admitted to hospital with COPD acute.  He has chronic hypercapnia by review of his blood gases.  He also has a significant cardiomyopathy which is ischemic, with an ejection fraction of 20-25%.    The patient states he feels well.   He says he is able to do some therapy. Last night it was noticed blood pressure was low at 80/38.  On call provider did hold her ACE inhibitor and Toprol until evaluation today.  Today patient appears to be stable he is resting in bed comfortably as pulse is 60-blood pressure taken manually 98/50.  Per review of previous blood pressures he appeared to have some low blood pressures with systolics in the in the 123XX123 with some frequency.  Apparently this has been asymptomatic he does not complain of syncope or increased weakness.  He is on Benazapril 40 mg a day as well as Toprol 12.5 mg twice a day.  It appears his pulses are largely in the 60s.  Weight-wise his weight most recently listed yesterday was 121.5---10 days ago it was 117.3-apparently he is eating somewhat better he does not have evidence of increased edema beyond baseline his wife is at bedside that she says edema appears to be improved.  He is on Lasix. 40 mg a day when necessary  --apparently he has not received it this month  PAST MEDICAL HISTORY/PROBLEM LIST:   Includes acute on chronic respiratory failure with hypoxia.  Abdominal aortic aneurysm.  COPD exasperation.  Chronic pain syndrome.  Chronic systolic CHF.  Severe protein calorie malnutrition.       CURRENT  MEDICATIONS:  Medication list is reviewed.     Prednisone taper.   Lasix 40 q.d.     Albuterol nebulizers q.4 p.r.n.      ASA 81 q.d.     Lipitor 80 q.d.     Lotensin 40 q.d.     Symbicort 160/4.5, 2 puffs b.i.d.     Celexa  of 20 mg/10 mg daily.   Toprol XL 12.5 daily.      Nitroglycerin p.r.n.    Spiriva 1 capsule inhaled daily.   Flomax 0.4 q.d.      LABORATORY DATA:   Last blood gas on 05/09/2015 showed a pH of 7.42, pCO2 of 68, O2 of 52.7, and bicarbonate of 39 on 4 L.     Jan-- 9 2017 .  Sodium 139 potassium 5 BUN 31 creatinine 0.43 when necessary  05/09/2015.  WBC 11.3 hemoglobin 15.1 platelets 87,000  Review of systems.  General is not complaining any fever or chills.   Skin does not complain of rashes or itching.  Respiratory history of significant COPD but is not complaining of increased shortness of breath beyond baseline.  Cardiac does not complain of chest pain does not appear to have significant lower extremity edema at this point per his wife appears to be improved.   GI is not complaining of nausea vomiting diarrhea constipation or abdominal discomfort.   musculoskeletal continues to have weakness but does not complaining of joint pain today does have a history of chronic pain.  Neurologic is not complaining of dizziness or syncope.  Psych appears to be good spirits does not press of symptoms.  Physical exam.   He is afebrile pulse is 60 respirations 18 blood pressure taken manually 98/50       GENERAL APPEARANCE:  The patient does not look to be in any distressResting in bed comfortably.  His skin is warm and dry.    CHEST/RESPIRATORY:  Shallow, but otherwise clear air entry bilaterallyNo labored breathing.    CARDIOVASCULAR:   CARDIAC:  Heart sounds are normal.  He appears to be euvolemic.Regular rate and rhythm pulse of 60       Abdomen is soft nontender with positive bowel sounds.  Muscle skeletal general frailty but is able to stop  4.  Neurologic appears grossly intact no lateralizing findingsr speech is clear  Psych he is pleasant and appropriate.  .       ASSESSMENT/PLAN: Hypertension-he appears to be asymptomatic although he does appear to have somewhat frequent systolic readings under 123XX123 cut his Lotensin down to 20 mg a day-he is on a fairly low dose of the Toprol at 12.5 mg twice a day-will write orders to hold Lotensin for any systolic blood pressure less than 105-and hold Toprol for any systolic pressure less than 100 Order to hold Lotensin for systolic neck continues to be less than 100              Chronic respiratory failure secondary to COPD.  He is a CO2 retainer with a pCO2 in the high 60s.  His elevated CO2 on basic metabolic panel reflects bicarb compensation for this.  This is actually better at 41 on lab done last week update BMP is pending    Potassium at 5.  his potassium has been discontinued.   Congestive heart failure, ischemic cardiomyopathy.  --continues on a beta blocker as well as ACE inhibitor--has had some hypotension-as noted above  He has a when necessary order for Lasix but apparently has not used this this month-he's had some slight weight gain although it does not appear to have increased edema at this point will monitor.     [id number]

## 2015-05-25 ENCOUNTER — Non-Acute Institutional Stay (SKILLED_NURSING_FACILITY): Payer: 59 | Admitting: Internal Medicine

## 2015-05-25 ENCOUNTER — Encounter (HOSPITAL_COMMUNITY)
Admission: AD | Admit: 2015-05-25 | Discharge: 2015-05-25 | Disposition: A | Discharge status: Home / Self Care | Payer: 59 | Source: Skilled Nursing Facility | Attending: Internal Medicine | Admitting: Internal Medicine

## 2015-05-25 DIAGNOSIS — I9589 Other hypotension: Secondary | ICD-10-CM

## 2015-05-25 DIAGNOSIS — D649 Anemia, unspecified: Secondary | ICD-10-CM | POA: Diagnosis not present

## 2015-05-25 DIAGNOSIS — R635 Abnormal weight gain: Secondary | ICD-10-CM

## 2015-05-25 DIAGNOSIS — I5022 Chronic systolic (congestive) heart failure: Secondary | ICD-10-CM

## 2015-05-25 LAB — CBC WITH DIFFERENTIAL/PLATELET
BASOS PCT: 0 %
Basophils Absolute: 0 10*3/uL (ref 0.0–0.1)
EOS ABS: 0 10*3/uL (ref 0.0–0.7)
EOS PCT: 1 %
HCT: 35.1 % — ABNORMAL LOW (ref 39.0–52.0)
Hemoglobin: 11.5 g/dL — ABNORMAL LOW (ref 13.0–17.0)
Lymphocytes Relative: 18 %
Lymphs Abs: 1.3 10*3/uL (ref 0.7–4.0)
MCH: 31.7 pg (ref 26.0–34.0)
MCHC: 32.8 g/dL (ref 30.0–36.0)
MCV: 96.7 fL (ref 78.0–100.0)
MONO ABS: 0.6 10*3/uL (ref 0.1–1.0)
MONOS PCT: 8 %
Neutro Abs: 5.4 10*3/uL (ref 1.7–7.7)
Neutrophils Relative %: 74 %
PLATELETS: 100 10*3/uL — AB (ref 150–400)
RBC: 3.63 MIL/uL — ABNORMAL LOW (ref 4.22–5.81)
RDW: 12.6 % (ref 11.5–15.5)
Smear Review: DECREASED
WBC: 7.3 10*3/uL (ref 4.0–10.5)

## 2015-05-25 LAB — BASIC METABOLIC PANEL
BUN: 20 mg/dL (ref 6–20)
CALCIUM: 8.8 mg/dL — AB (ref 8.9–10.3)
CHLORIDE: 97 mmol/L — AB (ref 101–111)
CO2: 41 mmol/L — ABNORMAL HIGH (ref 22–32)
CREATININE: 0.45 mg/dL — AB (ref 0.61–1.24)
GFR calc non Af Amer: >60 mL/min (ref >60)
Glucose, Bld: 90 mg/dL (ref 65–99)
POTASSIUM: 4.7 mmol/L (ref 3.5–5.1)
Sodium: 140 mmol/L (ref 135–145)

## 2015-05-25 NOTE — Progress Notes (Signed)
Patient ID: Tommy Turner, male   DOB: Sep 05, 1940, 75 y.o.   MRN: [id number]                  PROGRESS NOTE  DATE:  05/25/2015          FACILITY: [facility]                       LEVEL OF CARE:   SNF   Acute Visit               CHIEF COMPLAINT:  Acute visit secondary to numerous issues including follow-up hypotension-weight gain with history of CHF-anemia     HISTORY OF PRESENT ILLNESS:  This is a patient  admitted to the building about two weeks ago.   He was admitted to hospital with COPD acute.  He has chronic hypercapnia by review of his blood gases.  He also has a significant cardiomyopathy which is ischemic, with an ejection fraction of 20-25%.    The patient states he feels well.   He says he is able to do some therapy. He has had some low blood pressures recently-with systolics in the [id number] do seem yesterday and reduced his ACE inhibitor from 40 down to 20 mg a day-also wrote orders to hold it for systolic blood pressure less than 105-he is also on Toprol 12.5 mg twice a day wrote orders to hold this if systolic blood pressure less than 100.  I did take his blood pressure this morning and it was 90/50.  He does not complain of any dizziness or syncopal-type feelings apparently did fairly well in therapy he appears to be asymptomatic in this regard  .  It appears his pulses are largely in the 60-80's--I got 82 on exam today.  Weight-wise his weight most recently listed two days ago was 121.5--about -10 days ago it was 117.3-apparently he is eating somewhat better  Taking his supplements well.  However his weight listed today is 125 pounds this appears to be trending up even though he does not really appear to have any increased edema.  He is on Lasix. 40 mg a day when necessary  --apparently he has not received it this month  Labs done today showed his renal function is stable with a creatinine 0.45 BUN 20 potassium 4.7 sodium 140.  However appears his  hemoglobin has dropped some is down to 11.5 appears previously had been in the 13-15 range-he does have a history of chronic thrombocytopenia this appears stable at 100,000.  He does not complain of any rectal bleeding  PAST MEDICAL HISTORY/PROBLEM LIST:   Includes acute on chronic respiratory failure with hypoxia.  Abdominal aortic aneurysm.  COPD exasperation.  Chronic pain syndrome.  Chronic systolic CHF.  Severe protein calorie malnutrition.       CURRENT MEDICATIONS:  Medication list is reviewed.     Prednisone taper.   Lasix 40 q.d.prn     Albuterol nebulizers q.4 p.r.n.      ASA 81 q.d.     Lipitor 80 q.d.     Lotensin 40 q.d.     Symbicort 160/4.5, 2 puffs b.i.d.     Celexa  of 20 mg/10 mg daily.   Toprol XL 12.5 daily.      Nitroglycerin p.r.n.    Spiriva 1 capsule inhaled daily.   Flomax 0.4 q.d.      LABORATORY DATA:   Last blood gas on 05/09/2015 showed a pH of  7.42, pCO2 of 68, O2 of 52.7, and bicarbonate of 39 on 4 L.  Jan. 18 2017.  Sodium 140 potassium 4.7 BUN 20 creatinine 0.45.  WBC 7.3 hemoglobin 11.5 platelets 100,000       Jan-- 9 2017 .  Sodium 139 potassium 5 BUN 31 creatinine 0.43 when necessary  05/09/2015.  WBC 11.3 hemoglobin 15.1 platelets 87,000  Review of systems.  General is not complaining any fever or chills.   Skin does not complain of rashes or itching.  Respiratory history of significant COPD but is not complaining of increased shortness of breath beyond baseline.--  Cardiac does not complain of chest pain does not appear to have significant lower extremity edema a.   GI is not complaining of nausea vomiting diarrhea constipation or abdominal discomfort.   musculoskeletal continues to have weakness but does not complaining of joint pain today does have a history of chronic pain.  Neurologic is not complaining of dizziness or syncope.  Psych appears to be good spirits does not press of symptoms.  Physical  exam.   He is afebrile pulse is 82 blood pressure is 90/50 respirations 18       GENERAL APPEARANCE:  The patient does not look to be in any distress--sitting in his wheelchair comfortably.  His skin is warm and dry.    CHEST/RESPIRATORY:  Shallow, but otherwise clear air entry bilaterallyNo labored breathing.    CARDIOVASCULAR:   CARDIAC:  Heart sounds are distant  .  He appears to be euvolemic. Pulse appears to be regular with some irregular beats approximately 80 area  I do not note any significant lower extremity or coccyx edema      Abdomen is soft nontender with positive bowel sounds.  Rectal we did do a test for occult positive blood-this appeared to be negative although sample was quite small  Muscle skeletal general frailty but is able to move all extremities 4.  Neurologic appears grossly intact no lateralizing findingsr speech is clear  Psych he is pleasant and appropriate.  .       ASSESSMENT/PLAN: Hypertension-he appears to be asymptomatic although he does appear to have somewhat frequent systolic readings under 123XX123 cut his Lotensin down to 20 mg a day-he is on a fairly low dose of the Toprol at 12.5 mg twice a day-has orders to hold Lotensin for any systolic blood pressure less than 105-and hold Toprol for any systolic pressure less than 100 This point will monitor says orders were just written last night--he continues to be asymptomatic -             Chronic respiratory failure secondary to COPD.  He is a CO2 retainer with a pCO2 in the high 60s.  His elevated CO2 on basic metabolic panel reflects bicarb compensation for this.  This appears to be stable with a CO2 of 41 on lab done today--we will update this next week   his potassium of 5 his potassium was discontinued last week current potassium level was 4.7 will update this next week. As stated below we will restart some Lasix here I suspect he will need potassium supplementation as well  Congestive heart  failure, ischemic cardiomyopathy.  --continues on a beta blocker as well as ACE inhibitor--has had some hypotension-as noted above--  He has a when necessary order for Lasix but apparently has not used this this month-  Appearshe is starting to have some consistent weight gain-this was discussed with Dr. Dellia Nims will start Lasix 20 mg  a day-and will add potassium 10 mEq a day-I suspect his borderline high potassium previously was because it was unopposed by any Lasix-we will have to monitor this and update this first laboratory day next week.  Anemia with hemoglobin of 11.5 this appears to be high normocytic with an MCV of 96.7-will check a B12 and folate level-also continue to guaiac stools 3-also will order iron studies as well as total iron binding capacity and ferritin reticulocyte count--as well as a CBC later this week     [id number]--of note greater than 40 minutes spent assessing patient-discussing his status with nursing staff-reviewing his chart-and coordinating and formulating a plan of care for numerous diagnoses-of note  greater than 50% of time spent coordinating plan of care

## 2015-05-26 ENCOUNTER — Encounter (HOSPITAL_COMMUNITY)
Admission: AD | Admit: 2015-05-26 | Discharge: 2015-05-26 | Disposition: A | Discharge status: Home / Self Care | Payer: 59 | Source: Skilled Nursing Facility | Attending: Internal Medicine | Admitting: Internal Medicine

## 2015-05-27 ENCOUNTER — Non-Acute Institutional Stay (SKILLED_NURSING_FACILITY): Payer: 59 | Admitting: Internal Medicine

## 2015-05-27 ENCOUNTER — Encounter (HOSPITAL_COMMUNITY)
Admission: AD | Admit: 2015-05-27 | Discharge: 2015-05-27 | Disposition: A | Discharge status: Home / Self Care | Payer: 59 | Source: Skilled Nursing Facility | Attending: Internal Medicine | Admitting: Internal Medicine

## 2015-05-27 DIAGNOSIS — I5022 Chronic systolic (congestive) heart failure: Secondary | ICD-10-CM

## 2015-05-27 DIAGNOSIS — Z029 Encounter for administrative examinations, unspecified: Secondary | ICD-10-CM | POA: Diagnosis not present

## 2015-05-27 DIAGNOSIS — I959 Hypotension, unspecified: Secondary | ICD-10-CM

## 2015-05-27 DIAGNOSIS — R0989 Other specified symptoms and signs involving the circulatory and respiratory systems: Secondary | ICD-10-CM | POA: Diagnosis not present

## 2015-05-27 LAB — CBC
HEMATOCRIT: 35.9 % — AB (ref 39.0–52.0)
HEMOGLOBIN: 11.6 g/dL — AB (ref 13.0–17.0)
MCH: 30.9 pg (ref 26.0–34.0)
MCHC: 32.3 g/dL (ref 30.0–36.0)
MCV: 95.5 fL (ref 78.0–100.0)
Platelets: 99 10*3/uL — ABNORMAL LOW (ref 150–400)
RBC: 3.76 MIL/uL — ABNORMAL LOW (ref 4.22–5.81)
RDW: 12.7 % (ref 11.5–15.5)
WBC: 6.3 10*3/uL (ref 4.0–10.5)

## 2015-05-27 LAB — IRON AND TIBC
IRON: 119 ug/dL (ref 45–182)
Saturation Ratios: 60 % — ABNORMAL HIGH (ref 17.9–39.5)
TIBC: 197 ug/dL — AB (ref 250–450)
UIBC: 78 ug/dL

## 2015-05-27 LAB — FERRITIN: Ferritin: 200 ng/mL (ref 24–336)

## 2015-05-27 LAB — RETICULOCYTES
RBC.: 3.76 MIL/uL — AB (ref 4.22–5.81)
Retic Ct Pct: <0.4 % — ABNORMAL LOW (ref 0.4–3.1)

## 2015-05-27 LAB — FOLATE: Folate: 8.5 ng/mL (ref >5.9)

## 2015-05-27 LAB — VITAMIN B12: VITAMIN B 12: 237 pg/mL (ref 180–914)

## 2015-05-27 NOTE — Progress Notes (Signed)
Patient ID: Tommy Turner, male   DOB: [birth year], 75 y.o.   MRN: [id number]                   PROGRESS NOTE  DATE:  05/27/2015          FACILITY: [facility]                       LEVEL OF CARE:   SNF   Acute Visit               CHIEF COMPLAINT: Acute visit follow-up weight gain-hypotension      HISTORY OF PRESENT ILLNESS:  This is a patient  admitted to the building about two weeks ago.   He was admitted to hospital with COPD acute.  He has chronic hypercapnia by review of his blood gases.  He also has a significant cardiomyopathy which is ischemic, with an ejection fraction of 20-25%.    The patient states he feels well.   He says he is able to do some therapy. He has had some low blood pressures recently-with systolics in the [id number] Lotensin has been decreased to 20 mg a day there are also orders to holdToprol for systolic blood pressure less than 100.  His blood pressure continues to be low I got 88/50 today manually-I see listed once of 117/58-99/60 yesterday.  He does not appear to be symptomatic does not complain of any dizziness or syncopal-type feelings.   He also continues to gain weight weight yesterday 129.8-on January 18 was 127-on the 17th was 125 his weight on the 10th was 116.2.  Apparently he is eating somewhat better.  He has just been started on Lasix 20 mg a day routinely-he did have a when necessary order for 40 mg a day but apparently he never really receive this.  He does not complain of any increased shortness of breath or chest pain.  It also appears his hemoglobin has dropped some is down to 11.5 appears previously had been in the 13-15 range-he does have a history of chronic thrombocytopenia this appears stable at99,000  .  He does not complain of any rectal bleeding--there are orders to test his stool for occult blood  PAST MEDICAL HISTORY/PROBLEM LIST:   Includes acute on chronic respiratory failure with hypoxia.  Abdominal  aortic aneurysm.  COPD exasperation.  Chronic pain syndrome.  Chronic systolic CHF.  Severe protein calorie malnutrition.       CURRENT MEDICATIONS:  Medication list is reviewed.     Prednisone taper.   Lasix 20 mg by mouth daily  Albuterol nebulizers q.4 p.r.n.      ASA 81 q.d.     Lipitor 80 q.d.     Lotensin 20 q.d.     Symbicort 160/4.5, 2 puffs b.i.d.     Celexa  of 20 mg/10 mg daily.   Toprol XL 12.5 daily.      Nitroglycerin p.r.n.    Spiriva 1 capsule inhaled daily.   Flomax 0.4 q.d.      LABORATORY DATA 05/26/2014.  WBC 6.3 hemoglobin 11.6 platelets 99,000.  Reticulocyte-less than 0.4.      :   Last blood gas on 05/09/2015 showed a pH of 7.42, pCO2 of 68, O2 of 52.7, and bicarbonate of 39 on 4 L.  Jan. 18 2017.  Sodium 140 potassium 4.7 BUN 20 creatinine 0.45.  WBC 7.3 hemoglobin 11.5 platelets 100,000       Jan-- 9  2017 .  Sodium 139 potassium 5 BUN 31 creatinine 0.43 when necessary  05/09/2015.  WBC 11.3 hemoglobin 15.1 platelets 87,000  Review of systems.  General is not complaining any fever or chills.   Skin does not complain of rashes or itching.  Respiratory history of significant COPD but is not complaining of increased shortness of breath beyond baseline.--  Cardiac does not complain of chest pain does not appear to have significant lower extremity edema a.   GI is not complaining of nausea vomiting diarrhea constipation or abdominal discomfort.   musculoskeletal continues to have weakness but does not complaining of joint pain today does have a history of chronic pain.  Neurologic is not complaining of dizziness or syncope.  Psych appears to be good spirits--does not complain of depression s.  Physical exam.   Temperature 97.8 pulse 60 respirations 18 blood pressure 88/50 taken manually weight is 129.8       GENERAL APPEARANCE:  The patient does not look to be in any distress--lying bed comfortably.  His skin is  warm and dry.    CHEST/RESPIRATORY:  Shallow, with slight  Exp  wheezing on the right side but otherwise clear air entry bilaterallyNo labored breathing.    CARDIOVASCULAR:   CARDIAC:  Heart sounds are distant  .  He appears to be euvolemic. Pulse appears to be regular with some irregular beats It appears he has some  mild lower extremity edema however appears increased from when I saw him earlier in the week     Abdomen is soft nontender with positive bowel sounds.    Muscle skeletal general frailty but is able to move all extremities 4.  Neurologic appears grossly intact no lateralizing findings   speech is clear  Psych he is pleasant and appropriate.  .       ASSESSMENT/PLAN: Hypertension-he appears to be asymptomatic  Although he does appear to have frequent systolics under 123XX123 decrease his Lotensin down to 10 mg a day and monitor-he is also on Toprol although I suspect this is being held with some regularity with his low systolic readings-this was discussed with Dr. Dellia Nims via phone -             Chronic respiratory failure secondary to COPD.  He is a CO2 retainer with a pCO2 in the high 60s.  His elevated CO2 on basic metabolic panel reflects bicarb compensation for this.  This appears to be stable with a CO2 of 41 on lab done earlier this week   his potassium of 5 his potassium was discontinued last week current potassium level was 4.7 will update this next week.     Congestive heart failure, ischemic cardiomyopathy.  --continues on a beta blocker as well as ACE inhibitor-although these are held at times because of low blood pressure---has had some hypotension-as noted above--  He has a when necessary order for Lasix but apparently has not used this this month-  We did start him on Lasix 20 mg a day [facility] earlier this week-however with his edema and weight gain will increase this to 40 mg a day and increase his potassium to 20 mEq a day as supplementation as well-will  update a metabolic panel on Sunday, January 22 to keep an eye on this--this was discussed with Dr. Dellia Nims via phone as well.      k.  Anemia with hemoglobin of 11.5 this appears to be high normocytic with an MCV of 96.7--- lab work iron studies has largely  not returned at this point-retic count is less than 0.4-will need an updated CBC  Slight congestion noted on chest exam today-will order a chest x-ray to rule out anything acute clinically appears to be stable does not complain of any increased shortness of breath beyond baseline he does have significant COPD but this appears to be relatively stable   [id number]

## 2015-05-28 ENCOUNTER — Encounter (HOSPITAL_COMMUNITY)
Admission: RE | Admit: 2015-05-28 | Discharge: 2015-05-28 | Disposition: A | Discharge status: Home / Self Care | Payer: 59 | Source: Skilled Nursing Facility | Attending: Internal Medicine | Admitting: Internal Medicine

## 2015-05-28 ENCOUNTER — Inpatient Hospital Stay (HOSPITAL_COMMUNITY): Payer: 59 | Attending: Internal Medicine

## 2015-05-28 LAB — OCCULT BLOOD X 1 CARD TO LAB, STOOL: Fecal Occult Bld: NEGATIVE

## 2015-05-29 ENCOUNTER — Encounter (HOSPITAL_COMMUNITY)
Admission: RE | Admit: 2015-05-29 | Discharge: 2015-05-29 | Disposition: A | Discharge status: Home / Self Care | Payer: 59 | Source: Skilled Nursing Facility | Attending: Internal Medicine | Admitting: Internal Medicine

## 2015-05-29 ENCOUNTER — Non-Acute Institutional Stay (SKILLED_NURSING_FACILITY): Payer: 59 | Admitting: Internal Medicine

## 2015-05-29 ENCOUNTER — Encounter: Payer: Self-pay | Admitting: Internal Medicine

## 2015-05-29 DIAGNOSIS — I5022 Chronic systolic (congestive) heart failure: Secondary | ICD-10-CM | POA: Diagnosis not present

## 2015-05-29 DIAGNOSIS — I2510 Atherosclerotic heart disease of native coronary artery without angina pectoris: Secondary | ICD-10-CM | POA: Insufficient documentation

## 2015-05-29 DIAGNOSIS — J9612 Chronic respiratory failure with hypercapnia: Secondary | ICD-10-CM

## 2015-05-29 DIAGNOSIS — I10 Essential (primary) hypertension: Secondary | ICD-10-CM | POA: Insufficient documentation

## 2015-05-29 DIAGNOSIS — J9611 Chronic respiratory failure with hypoxia: Secondary | ICD-10-CM | POA: Diagnosis not present

## 2015-05-29 DIAGNOSIS — J441 Chronic obstructive pulmonary disease with (acute) exacerbation: Secondary | ICD-10-CM | POA: Diagnosis not present

## 2015-05-29 LAB — CBC
HEMATOCRIT: 32.1 % — AB (ref 39.0–52.0)
HEMOGLOBIN: 10.6 g/dL — AB (ref 13.0–17.0)
MCH: 30.9 pg (ref 26.0–34.0)
MCHC: 33 g/dL (ref 30.0–36.0)
MCV: 93.6 fL (ref 78.0–100.0)
Platelets: 98 10*3/uL — ABNORMAL LOW (ref 150–400)
RBC: 3.43 MIL/uL — ABNORMAL LOW (ref 4.22–5.81)
RDW: 12.6 % (ref 11.5–15.5)
WBC: 5.8 10*3/uL (ref 4.0–10.5)

## 2015-05-29 LAB — BASIC METABOLIC PANEL
ANION GAP: 0 — AB (ref 5–15)
BUN: 14 mg/dL (ref 6–20)
CALCIUM: 8.4 mg/dL — AB (ref 8.9–10.3)
CO2: 39 mmol/L — AB (ref 22–32)
Chloride: 99 mmol/L — ABNORMAL LOW (ref 101–111)
Creatinine, Ser: 0.34 mg/dL — ABNORMAL LOW (ref 0.61–1.24)
GFR calc Af Amer: >60 mL/min (ref >60)
GFR calc non Af Amer: >60 mL/min (ref >60)
GLUCOSE: 92 mg/dL (ref 65–99)
POTASSIUM: 4.2 mmol/L (ref 3.5–5.1)
Sodium: 138 mmol/L (ref 135–145)

## 2015-05-29 NOTE — Progress Notes (Signed)
Patient ID: Tommy Turner, male   DOB: [birth year], 75 y.o.   MRN: [id number]      Facility; [facility] Chief complaint; follow-up weight gain, congestive heart failure, chronic respiratory failure secondary to COPD, History; this is a patient who initially came here after a stay in hospital with acute COPD and respiratory failure. He is a CO2 retainer with a PCO2 in the high 60s. He has compensating I carb elevation reflected on his basic metabolic panel. He first came in the facility I did not taper his prednisone as his respiratory status seemed tenuous to me with continued accessory muscle use a markedly reduced air entry. The patient also has an ischemic cardiomyopathy with an ejection fraction of 20-25%. Since his arrival here he has been continuously gaining weight on 1/12 his weight was 115.8 pounds. Today's weight was 133.7 pounds. He did not come here on any diuretic but he was started on Lasix 20 mg on 1/18 that was increased to 40 mg on 1/20. He is also been noted to have a soft blood pressure with blood pressures running in the high 80s over 50s. We reduced his Lotensin to 20 mg on 1/17 and then 10 mg on 1/20. His blood pressure yesterday was 99/52.  Chest x-ray ordered 1/21 showed a new small left pleural effusion with mild patchy airspace opacity in the left lower lobe. Findings were felt to possibly represent pleural fluid and atelectasis however early bronchopneumonia was difficult to exclude. Background COPD noted  BMP Latest Ref Rng 05/29/2015 05/25/2015 05/16/2015  Glucose 65 - 99 mg/dL 92 90 88  BUN 6 - 20 mg/dL 14 20 31(H)  Creatinine 0.61 - 1.24 mg/dL 0.34(L) 0.45(L) 0.43(L)  Sodium 135 - 145 mmol/L 138 140 139  Potassium 3.5 - 5.1 mmol/L 4.2 4.7 5.0  Chloride 101 - 111 mmol/L 99(L) 97(L) 96(L)  CO2 22 - 32 mmol/L 39(H) 41(H) 41(H)  Calcium 8.9 - 10.3 mg/dL 8.4(L) 8.8(L) 9.1    CBC Latest Ref Rng 05/29/2015 05/27/2015 05/25/2015  WBC 4.0 - 10.5 K/uL 5.8 6.3 7.3  Hemoglobin 13.0 -  17.0 g/dL 10.6(L) 11.6(L) 11.5(L)  Hematocrit 39.0 - 52.0 % 32.1(L) 35.9(L) 35.1(L)  Platelets 150 - 400 K/uL 98(L) 99(L) 100(L)    Past Medical History  Diagnosis Date  . AAA (abdominal aortic aneurysm) ([place])   . Hyperlipidemia     takes Atorvastatin daily  . CAD (coronary artery disease)   . Ulcer     gastric  . Aneurysm ([place])   . Anxiety     takes Xanax daily as needed  . Depression     takes Celexa daily  . Hypertension     takes Metoprolol and Benazepril  daily  . Peripheral edema     takes Furosemide daily  . Myocardial infarction ([place])     20+yrs ago  . COPD (chronic obstructive pulmonary disease) ([facility])     Albuterol inhaler and neb daily as needed;takes SPiriva daily  . History of shingles   . CHF (congestive heart failure) ([place])   . Cancer ([place])   . Shortness of breath      on oxygen  2 liters continuous  . Headache(784.0)     occasionally    Past Surgical History  Procedure Laterality Date  . Lipoma excision      right lower back  . Partial gastrectomy      approx 1990  . Hernia repair Left     inguinal  . Tonsillectomy  as a child  . Esophagogastroduodenoscopy    . Coronary artery bypass graft  20+yrs ago    x 6  . Abdominal aortic endovascular stent graft N/A 09/14/2014    Procedure: ABDOMINAL AORTIC ENDOVASCULAR STENT GRAFT;  Surgeon: Conrad Trosky, MD;  Location: [facility] OR;  Service: Vascular;  Laterality: N/A;    Current medications Albuterol nebulizers every 4 when necessary ASA 81 daily Celexa 10 mg daily Flomax 0.4 daily Lasix 40 mg once a day Lipitor 80 daily Nicotine patch 14 mg per 24 hours Prednisone 40 mg once a day Spiriva 18 g daily Symbicort 160/4.52 puffs twice a day Tramadol 50 mg every 6 hours when necessary Tylenol 325 every 6 hours when necessary KCL 13meq qd   Review of systems Gen. patient states he feels well wants to go home Respiratory states his breathing is stable Cardiac he is not complaining of chest pain or  orthopnea GI no abdominal pain no diarrhea GU no dysuria Extremities he does note ankle edema  Physical examination Gen. the patient does not look to be unstable Vital signs O2 sat 94% at 2 L Respiratory rate 18 Pulse 66 Temperature 97.8 Respiratory; shallow air entry bilaterally but no crackles or wheezes are noted no accessory muscle use Cardiac heart sounds are difficult to hear due to a barrel-shaped chest from his underlying emphysema GI no abdominal pain no liver no spleen no tenderness no masses Extremities; 1-2+ edema to just above his ankles is noted no evidence of a DVT.  Impression/plan #1 COPD; this does not appear to be unstable. His prednisone taper should've been already reinitiated. I'll begin this today. #2 severe ischemic cardiomyopathy. His fluid retention could be secondary to steroids therefore I'm going to reduce his prednisone keep his Lasix at 40 mg. His electrolytes today looks stable. #3 small effusion noted on chest x-ray done yesterday. I do not believe this is pneumonia he would be a lot sicker. I don't think antibiotics are indicated. #4 hypotension with a blood pressure in the 123XX123 systolic. This seems to be somewhat better with reducing the Lotensin. When I was called about this is patient last week I thought I was told that he was on a beta blocker however I don't see this, nor do I really see that this is been discontinued however #5 chronic respiratory failure secondary to severe COPD. Fortunately this does not appear to be unstable. This CO2 on his basic metabolic panel is a consequence of renal retention of bicarbonate in response to the elevated PCO2 ( compensated respiratory acidosis]   The patient does not appear to be unstable at the moment. I am going to begin the taper of his prednisone that I interrupted when he first came into the building out of concern for his respiratory status

## 2015-05-30 ENCOUNTER — Encounter (HOSPITAL_COMMUNITY)
Admission: RE | Admit: 2015-05-30 | Discharge: 2015-05-30 | Disposition: A | Discharge status: Home / Self Care | Payer: 59 | Source: Skilled Nursing Facility | Attending: Internal Medicine | Admitting: Internal Medicine

## 2015-05-30 LAB — BASIC METABOLIC PANEL
ANION GAP: 3 — AB (ref 5–15)
BUN: 14 mg/dL (ref 6–20)
CALCIUM: 8.9 mg/dL (ref 8.9–10.3)
CO2: 38 mmol/L — ABNORMAL HIGH (ref 22–32)
Chloride: 98 mmol/L — ABNORMAL LOW (ref 101–111)
Creatinine, Ser: 0.4 mg/dL — ABNORMAL LOW (ref 0.61–1.24)
GLUCOSE: 92 mg/dL (ref 65–99)
POTASSIUM: 4.4 mmol/L (ref 3.5–5.1)
SODIUM: 139 mmol/L (ref 135–145)

## 2015-05-30 LAB — FOLATE RBC
FOLATE, RBC: 818 ng/mL (ref >498)
Folate, Hemolysate: 289.5 ng/mL
HEMATOCRIT: 35.4 % — AB (ref 37.5–51.0)

## 2015-06-01 ENCOUNTER — Non-Acute Institutional Stay (SKILLED_NURSING_FACILITY): Payer: 59 | Admitting: Internal Medicine

## 2015-06-01 ENCOUNTER — Encounter (HOSPITAL_COMMUNITY)
Admission: AD | Admit: 2015-06-01 | Discharge: 2015-06-01 | Disposition: A | Discharge status: Home / Self Care | Payer: 59 | Source: Skilled Nursing Facility | Attending: Internal Medicine | Admitting: Internal Medicine

## 2015-06-01 DIAGNOSIS — I10 Essential (primary) hypertension: Secondary | ICD-10-CM

## 2015-06-01 DIAGNOSIS — J9621 Acute and chronic respiratory failure with hypoxia: Secondary | ICD-10-CM | POA: Diagnosis not present

## 2015-06-01 DIAGNOSIS — I5022 Chronic systolic (congestive) heart failure: Secondary | ICD-10-CM

## 2015-06-01 DIAGNOSIS — G894 Chronic pain syndrome: Secondary | ICD-10-CM | POA: Diagnosis not present

## 2015-06-01 LAB — BASIC METABOLIC PANEL
Anion gap: 4 — ABNORMAL LOW (ref 5–15)
BUN: 17 mg/dL (ref 6–20)
CALCIUM: 8.4 mg/dL — AB (ref 8.9–10.3)
CO2: 37 mmol/L — ABNORMAL HIGH (ref 22–32)
CREATININE: 0.4 mg/dL — AB (ref 0.61–1.24)
Chloride: 97 mmol/L — ABNORMAL LOW (ref 101–111)
GFR calc Af Amer: >60 mL/min (ref >60)
GLUCOSE: 91 mg/dL (ref 65–99)
Potassium: 3.8 mmol/L (ref 3.5–5.1)
SODIUM: 138 mmol/L (ref 135–145)

## 2015-06-01 NOTE — Progress Notes (Signed)
Patient ID: Tommy Turner, male   DOB: March 09, 1941, 75 y.o.   MRN: [id number]                    PROGRESS NOTE  DATE:  06/01/2015          FACILITY: [facility]                       LEVEL OF CARE:   SNF  Discharge note   Acute Visit              CHIEF COMPLAINT: Discharge note      HISTORY OF PRESENT ILLNESS:  This is a patient  admitted to the building several weeks ago.   He was admitted to hospital with COPD acute.  He has chronic hypercapnia by review of his blood gases.  He also has a significant cardiomyopathy which is ischemic, with an ejection fraction of 20-25%.    The patient states he feels well.   He says he is able to do some therapy. He has had some low blood pressures recently-with systolics in the [id number] Lotensin has been decreased to 10 mg a day and Toprol as been discontinued  His blood pressure continues to be low I got 88/50 today manuall y he wasll sitting standing it was roughly comparable at 86/48-I see listed once of 117/58-99/60 recently.  He does not appear to be symptomatic does not complain of any dizziness or syncopal-type feelings.   He al also has gained weight although this appears to have been moderated since his Lasix was increased to 40 mg a day --- most recent weight 132.8-at one point he was 115.7-however his weight appears to have stabilized here last day or 2   Apparently he is eating somewhat better.  Marland Kitchen  He does not complain of any increased shortness of breath or chest pain.  It also appears his hemoglobin has dropped some is down to 10.6 appears previously had been in the 13-15 range-he does have a history of chronic thrombocytopenia this appears stable [id number]  .  He does not complain of any rectal bleeding--there are orders to test his stool for occult blood  Patient appears to be stable today is looking forward to going home he will need expedient follow-up by his primary care provider.  He will be going home  with his wife was very supportive-will need continued PT and OT for strengthening as well as nursing support for follow up his multiple medical issues-he also will need a rolling walker secondary to continued weakness  PAST MEDICAL HISTORY/PROBLEM LIST:   Includes acute on chronic respiratory failure with hypoxia.  Abdominal aortic aneurysm.  COPD exasperation.  Chronic pain syndrome.  Chronic systolic CHF.  Severe protein calorie malnutrition.       CURRENT MEDICATIONS:  Medication list is reviewed.     Prednisone taper.   Lasix 40 mg by mouth daily  Albuterol nebulizers q.4 p.r.n.      ASA 81 q.d.     Lipitor 80 q.d.     Lotensin 10 q.d.     Symbicort 160/4.5, 2 puffs b.i.d.     Celexa  of 20 mg/10 mg daily.  .      Nitroglycerin p.r.n.    Spiriva 1 capsule inhaled daily.   Flomax 0.4 q.d.      LABORATORY DATA 06/01/2015.  Sodium 138 potassium 3.8 BUN 17 creatinine 0.4.  05/29/2015.  WBC 5.8 hemoglobin 10.6  platelets 98,000  05/26/2014.    WBC 6.3 hemoglobin 11.6 platelets 99,000.  Reticulocyte-less than 0.4.      :   Last blood gas on 05/09/2015 showed a pH of 7.42, pCO2 of 68, O2 of 52.7, and bicarbonate of 39 on 4 L.  Jan. 18 2017.  Sodium 140 potassium 4.7 BUN 20 creatinine 0.45.  WBC 7.3 hemoglobin 11.5 platelets 100,000       Jan-- 9 2017 .  Sodium 139 potassium 5 BUN 31 creatinine 0.43 when necessary  05/09/2015.  WBC 11.3 hemoglobin 15.1 platelets 87,000  Review of systems.  General is not complaining any fever or chills.   Skin does not complain of rashes or itching.  Respiratory history of significant COPD but is not complaining of increased shortness of breath beyond baseline.--  Cardiac does not complain of chest pain appears to have some mild lower extremity edema a.   GI is not complaining of nausea vomiting diarrhea constipation or abdominal discomfort.   musculoskeletal continues to have weakness but does not  complaining of joint pain today does have a history of chronic pain.  Neurologic is not complaining of dizziness or syncope.  Psych appears to be good spirits--does not complain of depression s.  Physical exam.   T temperature 97.1 pulse 66 respirations 18 blood pressure-88/50 taken manually well sitting--86/48 taken manually while standing-weight is 132.8       GENERAL APPEARANCE:  The patient does not look to be in any distress--.  His skin is warm and dry. Eyes pupils appear reactive to light sclerae and conjunctivae are clear visual to ED appears grossly intact.  Oropharynx is clear mucous membranes moist    CHEST/RESPIRATORY:  Shallow,  With no labored breathing I cannot really appreciate wheezing today    CARDIOVASCULAR:   CARDIAC:  Heart sounds are distant  .  He appears to be euvolemic. Pulse appears to be regular with some irregular beats It appears he has some  mild lower extremity edema      Abdomen is soft nontender with positive bowel sounds.    Muscle skeletal general frailty but is able to move all extremities 4.--He ambulates in a rolling walker  Neurologic appears grossly intact no lateralizing findings   speech is clear  Psych he is pleasant and appropriate.  .       ASSESSMENT/PLAN: Hypertension-he appears to be asymptomatic  Although he does appear to have frequent systolics under [id number] His Lotensin as been decreased to 10 mg a day his Toprol has been discontinued-will need follow-up by primary care provider -             Chronic respiratory failure secondary to COPD.  He is a CO2 retainer with a pCO2 in the high 60s.  His elevated CO2 on basic metabolic panel reflects bicarb compensation for this.  This appears to be stable with a CO2 of 37 on lab donetoday  He continues on nebulizers-also is completing a prednisone taper continues on Spiriva and Symbicort as well as-he is oxygen dependent on 2 L                                                             Congestive heart failure, ischemic cardiomyopathy--his weight has increased although he is eating better which could  explain some of this-he does have some mild edema-his Lasix has recently been increased up to 40 mg a day he's also on potassium supplementation-clinically he appears to be stable-will need expedient follow-up by primary care provider.       k.  Anemia   Hemoglobin has dropped some from baseline at 10.5 so far occult testing appears to be negative Will warrant follow up by primary care provid    History of chronic pain-this appears to be stable with the tramadol as needed       [id number]-of note greater than 30 minutes spent on this discharge summary-greater than 50% of time spent coordinating plan of care for numerous diagnoses

## 2015-06-02 DIAGNOSIS — J441 Chronic obstructive pulmonary disease with (acute) exacerbation: Secondary | ICD-10-CM | POA: Diagnosis not present

## 2015-06-02 DIAGNOSIS — I255 Ischemic cardiomyopathy: Secondary | ICD-10-CM | POA: Diagnosis not present

## 2015-06-02 DIAGNOSIS — J9620 Acute and chronic respiratory failure, unspecified whether with hypoxia or hypercapnia: Secondary | ICD-10-CM | POA: Diagnosis not present

## 2015-06-04 DIAGNOSIS — J449 Chronic obstructive pulmonary disease, unspecified: Secondary | ICD-10-CM | POA: Diagnosis not present

## 2015-06-05 ENCOUNTER — Encounter: Payer: Self-pay | Admitting: Internal Medicine

## 2015-06-06 ENCOUNTER — Encounter: Payer: Self-pay | Admitting: Family Medicine

## 2015-06-06 ENCOUNTER — Ambulatory Visit (INDEPENDENT_AMBULATORY_CARE_PROVIDER_SITE_OTHER): Payer: 59 | Admitting: Family Medicine

## 2015-06-06 VITALS — BP 108/64 | Ht 67.0 in | Wt 138.0 lb

## 2015-06-06 DIAGNOSIS — J42 Unspecified chronic bronchitis: Secondary | ICD-10-CM

## 2015-06-06 DIAGNOSIS — I255 Ischemic cardiomyopathy: Secondary | ICD-10-CM

## 2015-06-06 DIAGNOSIS — I5022 Chronic systolic (congestive) heart failure: Secondary | ICD-10-CM

## 2015-06-06 DIAGNOSIS — R54 Age-related physical debility: Secondary | ICD-10-CM

## 2015-06-06 DIAGNOSIS — G894 Chronic pain syndrome: Secondary | ICD-10-CM

## 2015-06-06 MED ORDER — TIOTROPIUM BROMIDE MONOHYDRATE 18 MCG IN CAPS: ORAL_CAPSULE | RESPIRATORY_TRACT | Status: DC

## 2015-06-06 MED ORDER — NICOTINE 14 MG/24HR TD PT24: 14.0000 mg | MEDICATED_PATCH | Freq: Every day | TRANSDERMAL | Status: DC

## 2015-06-06 MED ORDER — BENAZEPRIL HCL 5 MG PO TABS: 5.0000 mg | ORAL_TABLET | Freq: Every day | ORAL | Status: DC

## 2015-06-06 MED ORDER — BUDESONIDE-FORMOTEROL FUMARATE 160-4.5 MCG/ACT IN AERO: 2.0000 | INHALATION_SPRAY | Freq: Two times a day (BID) | RESPIRATORY_TRACT | Status: DC

## 2015-06-06 MED ORDER — FUROSEMIDE 40 MG PO TABS: 40.0000 mg | ORAL_TABLET | Freq: Every day | ORAL | Status: DC

## 2015-06-06 MED ORDER — TAMSULOSIN HCL 0.4 MG PO CAPS: 0.4000 mg | ORAL_CAPSULE | Freq: Every day | ORAL | Status: DC

## 2015-06-06 MED ORDER — CITALOPRAM HYDROBROMIDE 20 MG PO TABS: ORAL_TABLET | ORAL | Status: DC

## 2015-06-06 MED ORDER — ATORVASTATIN CALCIUM 80 MG PO TABS: 80.0000 mg | ORAL_TABLET | Freq: Every day | ORAL | Status: DC

## 2015-06-06 MED FILL — NICOTINE 14 MG/24HR PATCH: 14 | 28 days supply | Qty: 28 | Fill #0

## 2015-06-06 MED FILL — SPIRIVA 18 MCG CP-HANDIHALE: 18 | 90 days supply | Qty: 90 | Fill #0

## 2015-06-06 NOTE — Progress Notes (Signed)
   Subjective:    Patient ID: Tommy Turner, male    DOB: [birth year], 75 y.o.   MRN: [id number]  HPIFollow up from Rehab. Was a [employer] for 2 weeks for COPD.  Patient is try to do a better job of eating try to do a better job of staying active uses his oxygen all the time still gets short of breath when he pushes himself denies any chest pressure denies PND denies excessive swelling. Pt states no new problems or concerns.  Patient has CHF on his recent ultrasound he is taking medications for this well compensated currently Patients back gives him some trouble takes pain medicine occasionally  Review of Systems Relates cough occasionally bringing up clear phlegm no fevers short of breath with activity uses his oxygen all the time denies swelling in the legs denies vomiting diarrhea relates good appetite    Objective:   Physical Exam Neck no masses lungs are clear no crackles heart is regular pulses are normal extremities no edema skin warm dry abdomen soft  Greater and 25 minutes spent with this patient discussing his COPD CHF recent hospitalization and his rehabilitation. Greater than half the time was spent in counseling.     Assessment & Plan:  Blood pressure is low I recommend reducing blood pressure medication Severe COPD follow-up patient in 6 weeks we will talk about end-of-life issues on follow-up Hypoxia with COPD continue 2 L of O2 on a regular basis Cachexia Mild frailty. CHF stable continue medications as ordered

## 2015-06-07 DIAGNOSIS — J9620 Acute and chronic respiratory failure, unspecified whether with hypoxia or hypercapnia: Secondary | ICD-10-CM | POA: Diagnosis not present

## 2015-06-07 DIAGNOSIS — J441 Chronic obstructive pulmonary disease with (acute) exacerbation: Secondary | ICD-10-CM | POA: Diagnosis not present

## 2015-06-07 DIAGNOSIS — I255 Ischemic cardiomyopathy: Secondary | ICD-10-CM | POA: Diagnosis not present

## 2015-06-08 DIAGNOSIS — I255 Ischemic cardiomyopathy: Secondary | ICD-10-CM | POA: Diagnosis not present

## 2015-06-08 DIAGNOSIS — J9620 Acute and chronic respiratory failure, unspecified whether with hypoxia or hypercapnia: Secondary | ICD-10-CM | POA: Diagnosis not present

## 2015-06-08 DIAGNOSIS — J441 Chronic obstructive pulmonary disease with (acute) exacerbation: Secondary | ICD-10-CM | POA: Diagnosis not present

## 2015-06-08 MED FILL — CITALOPRAM HBR 20 MG TABLET: 20 | 90 days supply | Qty: 45 | Fill #1

## 2015-06-09 DIAGNOSIS — I255 Ischemic cardiomyopathy: Secondary | ICD-10-CM | POA: Diagnosis not present

## 2015-06-09 DIAGNOSIS — J9620 Acute and chronic respiratory failure, unspecified whether with hypoxia or hypercapnia: Secondary | ICD-10-CM | POA: Diagnosis not present

## 2015-06-09 DIAGNOSIS — J441 Chronic obstructive pulmonary disease with (acute) exacerbation: Secondary | ICD-10-CM | POA: Diagnosis not present

## 2015-06-10 DIAGNOSIS — I255 Ischemic cardiomyopathy: Secondary | ICD-10-CM | POA: Diagnosis not present

## 2015-06-10 DIAGNOSIS — J441 Chronic obstructive pulmonary disease with (acute) exacerbation: Secondary | ICD-10-CM | POA: Diagnosis not present

## 2015-06-10 DIAGNOSIS — J9620 Acute and chronic respiratory failure, unspecified whether with hypoxia or hypercapnia: Secondary | ICD-10-CM | POA: Diagnosis not present

## 2015-06-13 DIAGNOSIS — J9620 Acute and chronic respiratory failure, unspecified whether with hypoxia or hypercapnia: Secondary | ICD-10-CM | POA: Diagnosis not present

## 2015-06-13 DIAGNOSIS — J441 Chronic obstructive pulmonary disease with (acute) exacerbation: Secondary | ICD-10-CM | POA: Diagnosis not present

## 2015-06-13 DIAGNOSIS — I255 Ischemic cardiomyopathy: Secondary | ICD-10-CM | POA: Diagnosis not present

## 2015-06-15 DIAGNOSIS — J9620 Acute and chronic respiratory failure, unspecified whether with hypoxia or hypercapnia: Secondary | ICD-10-CM | POA: Diagnosis not present

## 2015-06-15 DIAGNOSIS — J441 Chronic obstructive pulmonary disease with (acute) exacerbation: Secondary | ICD-10-CM | POA: Diagnosis not present

## 2015-06-15 DIAGNOSIS — I255 Ischemic cardiomyopathy: Secondary | ICD-10-CM | POA: Diagnosis not present

## 2015-06-15 DIAGNOSIS — I509 Heart failure, unspecified: Secondary | ICD-10-CM | POA: Diagnosis not present

## 2015-06-16 DIAGNOSIS — J9620 Acute and chronic respiratory failure, unspecified whether with hypoxia or hypercapnia: Secondary | ICD-10-CM | POA: Diagnosis not present

## 2015-06-16 DIAGNOSIS — J441 Chronic obstructive pulmonary disease with (acute) exacerbation: Secondary | ICD-10-CM | POA: Diagnosis not present

## 2015-06-16 DIAGNOSIS — I255 Ischemic cardiomyopathy: Secondary | ICD-10-CM | POA: Diagnosis not present

## 2015-06-20 DIAGNOSIS — I255 Ischemic cardiomyopathy: Secondary | ICD-10-CM | POA: Diagnosis not present

## 2015-06-20 DIAGNOSIS — J9620 Acute and chronic respiratory failure, unspecified whether with hypoxia or hypercapnia: Secondary | ICD-10-CM | POA: Diagnosis not present

## 2015-06-20 DIAGNOSIS — J441 Chronic obstructive pulmonary disease with (acute) exacerbation: Secondary | ICD-10-CM | POA: Diagnosis not present

## 2015-06-21 DIAGNOSIS — J441 Chronic obstructive pulmonary disease with (acute) exacerbation: Secondary | ICD-10-CM | POA: Diagnosis not present

## 2015-06-21 DIAGNOSIS — I255 Ischemic cardiomyopathy: Secondary | ICD-10-CM | POA: Diagnosis not present

## 2015-06-21 DIAGNOSIS — J9620 Acute and chronic respiratory failure, unspecified whether with hypoxia or hypercapnia: Secondary | ICD-10-CM | POA: Diagnosis not present

## 2015-06-23 ENCOUNTER — Telehealth: Payer: Self-pay | Admitting: Family Medicine

## 2015-06-23 NOTE — Telephone Encounter (Signed)
Just Felizardo Hoffmann from [facility] called to notify Dr. Nicki Reaper that [facility] Diamond cancelled an appointment for their services due to a stomach virus going around in his home.

## 2015-06-27 ENCOUNTER — Other Ambulatory Visit (HOSPITAL_COMMUNITY)
Admission: AD | Admit: 2015-06-27 | Discharge: 2015-06-27 | Disposition: A | Discharge status: Home / Self Care | Payer: 59 | Source: Skilled Nursing Facility | Attending: Family Medicine | Admitting: Family Medicine

## 2015-06-27 ENCOUNTER — Other Ambulatory Visit: Payer: Self-pay | Admitting: *Deleted

## 2015-06-27 ENCOUNTER — Telehealth: Payer: Self-pay | Admitting: *Deleted

## 2015-06-27 DIAGNOSIS — I509 Heart failure, unspecified: Secondary | ICD-10-CM | POA: Insufficient documentation

## 2015-06-27 DIAGNOSIS — J441 Chronic obstructive pulmonary disease with (acute) exacerbation: Secondary | ICD-10-CM | POA: Insufficient documentation

## 2015-06-27 DIAGNOSIS — I255 Ischemic cardiomyopathy: Secondary | ICD-10-CM | POA: Insufficient documentation

## 2015-06-27 DIAGNOSIS — N390 Urinary tract infection, site not specified: Secondary | ICD-10-CM | POA: Insufficient documentation

## 2015-06-27 DIAGNOSIS — J9620 Acute and chronic respiratory failure, unspecified whether with hypoxia or hypercapnia: Secondary | ICD-10-CM | POA: Diagnosis not present

## 2015-06-27 LAB — URINALYSIS, ROUTINE W REFLEX MICROSCOPIC
Bilirubin Urine: NEGATIVE
Glucose, UA: 100 mg/dL — AB
Hgb urine dipstick: NEGATIVE
Ketones, ur: NEGATIVE mg/dL
LEUKOCYTES UA: NEGATIVE
NITRITE: POSITIVE — AB
PH: 5 (ref 5.0–8.0)

## 2015-06-27 LAB — URINE MICROSCOPIC-ADD ON: RBC / HPF: NONE SEEN RBC/hpf (ref 0–5)

## 2015-06-27 MED ORDER — CEFPROZIL 500 MG PO TABS: 500.0000 mg | ORAL_TABLET | Freq: Two times a day (BID) | ORAL | Status: DC

## 2015-06-27 MED FILL — TAMSULOSIN HCL 0.4 MG CAP: 0.4 | 90 days supply | Qty: 90 | Fill #1

## 2015-06-27 NOTE — Telephone Encounter (Signed)
Nurse also informed to collect urine before taking antibiotic. Med sent to pharm. Home health nurse notified.

## 2015-06-27 NOTE — Telephone Encounter (Signed)
Home health nurse Juliann Pulse calling to report pt is having burning with urination for the past week and blood in urine today. Consult with dr Nicki Reaper. Verbal order given to do u/a and urine culture. cefzil 500 one bid for 10 days. Send to [facility].

## 2015-06-29 DIAGNOSIS — J9620 Acute and chronic respiratory failure, unspecified whether with hypoxia or hypercapnia: Secondary | ICD-10-CM | POA: Diagnosis not present

## 2015-06-29 DIAGNOSIS — I255 Ischemic cardiomyopathy: Secondary | ICD-10-CM | POA: Diagnosis not present

## 2015-06-29 DIAGNOSIS — J441 Chronic obstructive pulmonary disease with (acute) exacerbation: Secondary | ICD-10-CM | POA: Diagnosis not present

## 2015-06-29 LAB — URINE CULTURE: Culture: NO GROWTH

## 2015-06-30 ENCOUNTER — Ambulatory Visit (INDEPENDENT_AMBULATORY_CARE_PROVIDER_SITE_OTHER): Payer: 59 | Admitting: Family Medicine

## 2015-06-30 VITALS — Ht 67.0 in | Wt 135.0 lb

## 2015-06-30 DIAGNOSIS — N400 Enlarged prostate without lower urinary tract symptoms: Secondary | ICD-10-CM

## 2015-06-30 DIAGNOSIS — N401 Enlarged prostate with lower urinary tract symptoms: Secondary | ICD-10-CM

## 2015-06-30 DIAGNOSIS — R600 Localized edema: Secondary | ICD-10-CM | POA: Diagnosis not present

## 2015-06-30 DIAGNOSIS — R338 Other retention of urine: Secondary | ICD-10-CM | POA: Diagnosis not present

## 2015-06-30 DIAGNOSIS — R972 Elevated prostate specific antigen [PSA]: Secondary | ICD-10-CM

## 2015-06-30 DIAGNOSIS — R300 Dysuria: Secondary | ICD-10-CM

## 2015-06-30 DIAGNOSIS — J438 Other emphysema: Secondary | ICD-10-CM

## 2015-06-30 DIAGNOSIS — I255 Ischemic cardiomyopathy: Secondary | ICD-10-CM

## 2015-06-30 LAB — POCT URINALYSIS DIPSTICK
PH UA: 7
SPEC GRAV UA: 1.015

## 2015-06-30 MED ORDER — TAMSULOSIN HCL 0.4 MG PO CAPS
0.8000 mg | ORAL_CAPSULE | Freq: Every day | ORAL | Status: DC
Start: 2015-06-30 — End: 2016-02-14

## 2015-06-30 MED ORDER — CEFPROZIL 500 MG PO TABS: 500.0000 mg | ORAL_TABLET | Freq: Two times a day (BID) | ORAL | Status: DC

## 2015-06-30 MED ORDER — TORSEMIDE 20 MG PO TABS: 40.0000 mg | ORAL_TABLET | Freq: Every day | ORAL | Status: DC

## 2015-06-30 NOTE — Progress Notes (Signed)
   Subjective:    Patient ID: Tommy Turner, male    DOB: 1940-06-22, 75 y.o.   MRN: [id number]  HPI  Patient arrives with c/o swollen feet and difficulty urinating for 3-4 days. No fever. Difficult urination or in past few days urinalysis did show some WBCs he was placed on antibiotics. The patient denied any high fevers denied any nausea but states low appetite and low energy difficult time urinating is noticed his legs are swelling he has a severe history of COPD hypoxia and low appetite.  Review of Systems Patient with some nausea no vomiting no diarrhea having difficulty with urination in addition to this having difficult time getting around short of breath with activity denies chest pressure tightness or pain    Objective:   Physical Exam On physical exam patient does not appear to look like he feels ill. He is cachectic. Appears frail though. Lungs are clear no crackles heart is regular pulse normal abdomen is soft prostate severely enlarged prostate nontender. Extremities 1-2+ edema in the lower legs.  25 minutes was spent with the patient. Greater than half the time was spent in discussion and answering questions and counseling regarding the issues that the patient came in for today. Greater than half the time spent discussing this particular issue with the patient answering questions regarding COPD and BPH     Assessment & Plan:  Patient has lost little bit of weight I believe this is related to the fact he is just not eating well in addition to this I believe this patient would benefit from plenty of fluids along with better oral intake Change diuretic Urgent referral to urology he will need a catheter Increase Flomax to 2 each evening Follow-up within 2-3 weeks for recheck Lab work ordered May need adjustments based upon all of this If dietary continues to decline may have to try a appetite stimulant

## 2015-07-01 ENCOUNTER — Telehealth: Payer: Self-pay | Admitting: Family Medicine

## 2015-07-01 ENCOUNTER — Other Ambulatory Visit: Payer: Self-pay | Admitting: *Deleted

## 2015-07-01 DIAGNOSIS — N401 Enlarged prostate with lower urinary tract symptoms: Secondary | ICD-10-CM | POA: Insufficient documentation

## 2015-07-01 DIAGNOSIS — E871 Hypo-osmolality and hyponatremia: Secondary | ICD-10-CM

## 2015-07-01 DIAGNOSIS — R338 Other retention of urine: Secondary | ICD-10-CM

## 2015-07-01 LAB — BASIC METABOLIC PANEL
BUN/Creatinine Ratio: 18 (ref 10–22)
BUN: 12 mg/dL (ref 8–27)
CHLORIDE: 82 mmol/L — AB (ref 96–106)
CO2: 25 mmol/L (ref 18–29)
Calcium: 9 mg/dL (ref 8.6–10.2)
Creatinine, Ser: 0.66 mg/dL — ABNORMAL LOW (ref 0.76–1.27)
GFR calc Af Amer: 109 mL/min/{1.73_m2} (ref >59)
GFR, EST NON AFRICAN AMERICAN: 95 mL/min/{1.73_m2} (ref >59)
GLUCOSE: 101 mg/dL — AB (ref 65–99)
POTASSIUM: 5.1 mmol/L (ref 3.5–5.2)
SODIUM: 121 mmol/L — AB (ref 134–144)

## 2015-07-01 LAB — HEPATIC FUNCTION PANEL
ALBUMIN: 3 g/dL — AB (ref 3.5–4.8)
ALT: 12 IU/L (ref 0–44)
AST: 21 IU/L (ref 0–40)
Alkaline Phosphatase: 82 IU/L (ref 39–117)
Bilirubin Total: 0.9 mg/dL (ref 0.0–1.2)
Bilirubin, Direct: 0.41 mg/dL — ABNORMAL HIGH (ref 0.00–0.40)
Total Protein: 5.2 g/dL — ABNORMAL LOW (ref 6.0–8.5)

## 2015-07-01 LAB — CBC WITH DIFFERENTIAL/PLATELET
BASOS ABS: 0 10*3/uL (ref 0.0–0.2)
Basos: 0 %
EOS (ABSOLUTE): 0 10*3/uL (ref 0.0–0.4)
Eos: 0 %
HEMATOCRIT: 34.9 % — AB (ref 37.5–51.0)
Hemoglobin: 11.9 g/dL — ABNORMAL LOW (ref 12.6–17.7)
IMMATURE GRANULOCYTES: 0 %
Immature Grans (Abs): 0 10*3/uL (ref 0.0–0.1)
LYMPHS ABS: 0.8 10*3/uL (ref 0.7–3.1)
Lymphs: 5 %
MCH: 30.4 pg (ref 26.6–33.0)
MCHC: 34.1 g/dL (ref 31.5–35.7)
MCV: 89 fL (ref 79–97)
MONOS ABS: 1.6 10*3/uL — AB (ref 0.1–0.9)
Monocytes: 11 %
NEUTROS PCT: 84 %
Neutrophils Absolute: 12.1 10*3/uL — ABNORMAL HIGH (ref 1.4–7.0)
PLATELETS: 297 10*3/uL (ref 150–379)
RBC: 3.91 x10E6/uL — AB (ref 4.14–5.80)
RDW: 15.4 % (ref 12.3–15.4)
WBC: 14.5 10*3/uL — AB (ref 3.4–10.8)

## 2015-07-01 NOTE — Telephone Encounter (Signed)
Nurses please talk with the nurse spine at what she needs a note telling what she needs to know patient is already on antibiotics was seen by urology yesterday please try to handle this message if you need me come talk to me while the nurses on the line

## 2015-07-01 NOTE — Telephone Encounter (Signed)
Discussed with home health nurse

## 2015-07-01 NOTE — Telephone Encounter (Signed)
[facility] nurse Juliann Pulse called wanting to know results to patient's urine culture. Please advise.  Juliann Pulse [phone]

## 2015-07-04 ENCOUNTER — Telehealth: Payer: Self-pay | Admitting: Family Medicine

## 2015-07-04 MED ORDER — HYDROCODONE-ACETAMINOPHEN 10-325 MG PO TABS: 1.0000 | ORAL_TABLET | Freq: Four times a day (QID) | ORAL | Status: DC | PRN

## 2015-07-04 NOTE — Telephone Encounter (Signed)
Requesting Rx for hydrocodone 10 mg.  He is completely out.

## 2015-07-04 NOTE — Telephone Encounter (Signed)
Left message on voicemail notifying patient script ready for pickup.  

## 2015-07-04 NOTE — Telephone Encounter (Signed)
May have a prescription for hydrocodone 10 mg/325 mg, 1 every 6 hours when necessary pain, #60

## 2015-07-05 DIAGNOSIS — J449 Chronic obstructive pulmonary disease, unspecified: Secondary | ICD-10-CM | POA: Diagnosis not present

## 2015-07-06 DIAGNOSIS — I255 Ischemic cardiomyopathy: Secondary | ICD-10-CM | POA: Diagnosis not present

## 2015-07-06 DIAGNOSIS — J9620 Acute and chronic respiratory failure, unspecified whether with hypoxia or hypercapnia: Secondary | ICD-10-CM | POA: Diagnosis not present

## 2015-07-06 DIAGNOSIS — J441 Chronic obstructive pulmonary disease with (acute) exacerbation: Secondary | ICD-10-CM | POA: Diagnosis not present

## 2015-07-08 DIAGNOSIS — E871 Hypo-osmolality and hyponatremia: Secondary | ICD-10-CM | POA: Diagnosis not present

## 2015-07-09 LAB — BASIC METABOLIC PANEL
BUN/Creatinine Ratio: 15 (ref 10–22)
BUN: 10 mg/dL (ref 8–27)
CALCIUM: 9.3 mg/dL (ref 8.6–10.2)
CO2: 26 mmol/L (ref 18–29)
CREATININE: 0.68 mg/dL — AB (ref 0.76–1.27)
Chloride: 96 mmol/L (ref 96–106)
GFR, EST AFRICAN AMERICAN: 108 mL/min/{1.73_m2} (ref >59)
GFR, EST NON AFRICAN AMERICAN: 93 mL/min/{1.73_m2} (ref >59)
Glucose: 52 mg/dL — ABNORMAL LOW (ref 65–99)
Potassium: 4.5 mmol/L (ref 3.5–5.2)
Sodium: 138 mmol/L (ref 134–144)

## 2015-07-13 ENCOUNTER — Ambulatory Visit: Payer: 59 | Admitting: Family Medicine

## 2015-07-13 DIAGNOSIS — J441 Chronic obstructive pulmonary disease with (acute) exacerbation: Secondary | ICD-10-CM | POA: Diagnosis not present

## 2015-07-13 DIAGNOSIS — I255 Ischemic cardiomyopathy: Secondary | ICD-10-CM | POA: Diagnosis not present

## 2015-07-13 DIAGNOSIS — J9620 Acute and chronic respiratory failure, unspecified whether with hypoxia or hypercapnia: Secondary | ICD-10-CM | POA: Diagnosis not present

## 2015-07-14 ENCOUNTER — Encounter: Payer: Self-pay | Admitting: Family Medicine

## 2015-07-14 ENCOUNTER — Ambulatory Visit (INDEPENDENT_AMBULATORY_CARE_PROVIDER_SITE_OTHER): Payer: 59 | Admitting: Family Medicine

## 2015-07-14 VITALS — BP 110/68 | Ht 67.0 in | Wt 133.4 lb

## 2015-07-14 DIAGNOSIS — G894 Chronic pain syndrome: Secondary | ICD-10-CM

## 2015-07-14 DIAGNOSIS — I255 Ischemic cardiomyopathy: Secondary | ICD-10-CM

## 2015-07-14 MED ORDER — HYDROCODONE-ACETAMINOPHEN 10-325 MG PO TABS: 1.0000 | ORAL_TABLET | Freq: Four times a day (QID) | ORAL | Status: DC | PRN

## 2015-07-14 MED ORDER — TORSEMIDE 20 MG PO TABS: 40.0000 mg | ORAL_TABLET | Freq: Every day | ORAL | Status: DC

## 2015-07-14 MED FILL — ALBUTEROL 0.083% INHAL SOLN: (2.5 MG/3ML | 10 days supply | Qty: 150 | Fill #0

## 2015-07-14 MED FILL — VENTOLIN HFA 90 MCG INHALER: 108 (90 BAS | 25 days supply | Qty: 18 | Fill #0

## 2015-07-14 MED FILL — TORSEMIDE 20 MG TABLET: 20 | 90 days supply | Qty: 180 | Fill #0

## 2015-07-14 NOTE — Patient Instructions (Signed)

## 2015-07-14 NOTE — Progress Notes (Signed)
   Subjective:    Patient ID: Tommy Turner, male    DOB: [birth year], 75 y.o.   MRN: [id number]  HPI This patient was seen today for chronic pain  The medication list was reviewed and updated.   -Compliance with medication: yes  - Number patient states they take daily: 2-3 daily   -when was the last dose patient took: yesterday   The patient was advised the importance of maintaining medication and not using illegal substances with these.  Refills needed: yes  The patient was educated that we can provide 3 monthly scripts for their medication, it is their responsibility to follow the instructions.  Side effects or complications from medications: none  Patient is aware that pain medications are meant to minimize the severity of the pain to allow their pain levels to improve to allow for better function. They are aware of that pain medications cannot totally remove their pain.  Due for UDT ( at least once per year) : utd  Patient states that his urinary tube is too long and in the way.      Review of Systems  Constitutional: Negative for activity change.  Gastrointestinal: Negative for vomiting and abdominal pain.  Neurological: Negative for weakness.  Psychiatric/Behavioral: Negative for confusion.       Objective:   Physical Exam  Constitutional: He appears well-nourished.  Cardiovascular: Normal rate, regular rhythm and normal heart sounds.   No murmur heard. Pulmonary/Chest: Effort normal and breath sounds normal.  Musculoskeletal: He exhibits no edema.  Lymphadenopathy:    He has no cervical adenopathy.  Neurological: He is alert.  Psychiatric: His behavior is normal.  Vitals reviewed.         Assessment & Plan:  The patient was seen today as part of a comprehensive visit regarding pain control. Patient's compliance with the medication as well as discussion regarding effectiveness was completed. Prescriptions were written. Patient was advised to follow-up  in 3 months. The patient was assessed for any signs of severe side effects. The patient was advised to take the medicine as directed and to report to [place] if any side effect issues.

## 2015-07-20 DIAGNOSIS — J441 Chronic obstructive pulmonary disease with (acute) exacerbation: Secondary | ICD-10-CM | POA: Diagnosis not present

## 2015-07-20 DIAGNOSIS — I255 Ischemic cardiomyopathy: Secondary | ICD-10-CM | POA: Diagnosis not present

## 2015-07-20 DIAGNOSIS — J9620 Acute and chronic respiratory failure, unspecified whether with hypoxia or hypercapnia: Secondary | ICD-10-CM | POA: Diagnosis not present

## 2015-07-25 DIAGNOSIS — J441 Chronic obstructive pulmonary disease with (acute) exacerbation: Secondary | ICD-10-CM | POA: Diagnosis not present

## 2015-07-26 MED FILL — POTASSIUM CL ER 20 MEQ TAB: 20 | 90 days supply | Qty: 90 | Fill #1

## 2015-07-28 DIAGNOSIS — J9620 Acute and chronic respiratory failure, unspecified whether with hypoxia or hypercapnia: Secondary | ICD-10-CM | POA: Diagnosis not present

## 2015-07-28 DIAGNOSIS — I255 Ischemic cardiomyopathy: Secondary | ICD-10-CM | POA: Diagnosis not present

## 2015-07-28 DIAGNOSIS — J441 Chronic obstructive pulmonary disease with (acute) exacerbation: Secondary | ICD-10-CM | POA: Diagnosis not present

## 2015-07-29 ENCOUNTER — Telehealth: Payer: Self-pay | Admitting: *Deleted

## 2015-07-29 NOTE — Telephone Encounter (Signed)
Incoming fax from [facility]. Requesting metoprolol succ er 25mg  tablet. Take one half tablet po twice daily.  Last filled 03/07/15 for 90 day supply. Med is not on med list. Last prescribed by Leonia Reader.

## 2015-08-01 MED ORDER — METOPROLOL SUCCINATE ER 25 MG PO TB24
12.5000 mg | ORAL_TABLET | Freq: Two times a day (BID) | ORAL | Status: DC
Start: 2015-08-01 — End: 2016-04-03

## 2015-08-01 MED FILL — METOPROLOL SUCC ER 25 MG TA: 25 | 90 days supply | Qty: 90 | Fill #0

## 2015-08-01 NOTE — Telephone Encounter (Signed)
I would recommend talking with the patient/his wife try to verify if he is in fact taking this medicine or not since it isn't on our med list once you find this out then we can decide. Obviously if he is already on it I would recommend refilling it if he is not on this medicine I would recommend not refilling

## 2015-08-01 NOTE — Telephone Encounter (Signed)
Talked to [name] (patient's wife) and she stated that patient is taking this medication daily. Refill was sent to [place].

## 2015-08-02 ENCOUNTER — Other Ambulatory Visit: Payer: Self-pay

## 2015-08-02 DIAGNOSIS — J441 Chronic obstructive pulmonary disease with (acute) exacerbation: Secondary | ICD-10-CM | POA: Diagnosis not present

## 2015-08-02 DIAGNOSIS — J9620 Acute and chronic respiratory failure, unspecified whether with hypoxia or hypercapnia: Secondary | ICD-10-CM | POA: Diagnosis not present

## 2015-08-02 DIAGNOSIS — J449 Chronic obstructive pulmonary disease, unspecified: Secondary | ICD-10-CM | POA: Diagnosis not present

## 2015-08-02 DIAGNOSIS — Z466 Encounter for fitting and adjustment of urinary device: Secondary | ICD-10-CM | POA: Diagnosis not present

## 2015-08-02 MED ORDER — NITROGLYCERIN 0.4 MG SL SUBL: 0.4000 mg | SUBLINGUAL_TABLET | SUBLINGUAL | Status: DC | PRN

## 2015-08-02 MED FILL — NITROGLYCERIN 0.4 MG TAB SL: 0.4 | 90 days supply | Qty: 100 | Fill #0

## 2015-08-05 ENCOUNTER — Ambulatory Visit (INDEPENDENT_AMBULATORY_CARE_PROVIDER_SITE_OTHER): Payer: 59 | Admitting: Urology

## 2015-08-05 DIAGNOSIS — N139 Obstructive and reflux uropathy, unspecified: Secondary | ICD-10-CM

## 2015-08-05 DIAGNOSIS — R338 Other retention of urine: Secondary | ICD-10-CM

## 2015-08-05 DIAGNOSIS — N401 Enlarged prostate with lower urinary tract symptoms: Secondary | ICD-10-CM | POA: Diagnosis not present

## 2015-08-05 MED FILL — FINASTERIDE 5 MG TABLET: 5 | 30 days supply | Qty: 30 | Fill #0

## 2015-08-09 DIAGNOSIS — J9620 Acute and chronic respiratory failure, unspecified whether with hypoxia or hypercapnia: Secondary | ICD-10-CM | POA: Diagnosis not present

## 2015-08-09 DIAGNOSIS — Z466 Encounter for fitting and adjustment of urinary device: Secondary | ICD-10-CM | POA: Diagnosis not present

## 2015-08-09 DIAGNOSIS — J441 Chronic obstructive pulmonary disease with (acute) exacerbation: Secondary | ICD-10-CM | POA: Diagnosis not present

## 2015-08-12 DIAGNOSIS — J441 Chronic obstructive pulmonary disease with (acute) exacerbation: Secondary | ICD-10-CM | POA: Diagnosis not present

## 2015-08-12 DIAGNOSIS — Z466 Encounter for fitting and adjustment of urinary device: Secondary | ICD-10-CM | POA: Diagnosis not present

## 2015-08-12 DIAGNOSIS — I255 Ischemic cardiomyopathy: Secondary | ICD-10-CM | POA: Diagnosis not present

## 2015-08-12 DIAGNOSIS — J9620 Acute and chronic respiratory failure, unspecified whether with hypoxia or hypercapnia: Secondary | ICD-10-CM | POA: Diagnosis not present

## 2015-08-17 MED FILL — ALBUTEROL 0.083% INHAL SOLN: (2.5 MG/3ML | 10 days supply | Qty: 150 | Fill #1

## 2015-08-22 ENCOUNTER — Telehealth: Payer: Self-pay | Admitting: *Deleted

## 2015-08-22 NOTE — Telephone Encounter (Signed)
[facility] see form in nurse's folder. Request from [facility] reimbursement requesting leg bag. Dr. Nicki Reaper wants to know if pt still has catheter.

## 2015-08-23 DIAGNOSIS — J441 Chronic obstructive pulmonary disease with (acute) exacerbation: Secondary | ICD-10-CM | POA: Diagnosis not present

## 2015-08-23 DIAGNOSIS — J9620 Acute and chronic respiratory failure, unspecified whether with hypoxia or hypercapnia: Secondary | ICD-10-CM | POA: Diagnosis not present

## 2015-08-23 DIAGNOSIS — Z466 Encounter for fitting and adjustment of urinary device: Secondary | ICD-10-CM | POA: Diagnosis not present

## 2015-08-23 NOTE — Telephone Encounter (Signed)
Pt's wife states he does have a catheter. they are getting leg bags from home health. She will ask home health nurse today if they are getting through [employer] and call us back if they need form filled out. Form in nurse's folder.

## 2015-09-01 ENCOUNTER — Telehealth: Payer: Self-pay | Admitting: Family Medicine

## 2015-09-01 NOTE — Telephone Encounter (Signed)
Please see previous nurses phone message regarding catheter we keep getting request for these from [employer] unless the wife states that we need to fill these out we will start shredding these requests and not paying any attention to them. They are becoming a overburden in regards to the number of times there being sent to [place]. Please clarify with the wife regarding this issue and if necessary should read requests accordingly and let Pamala Hurry know about this as well because she states we keep getting these faxes

## 2015-09-02 DIAGNOSIS — J449 Chronic obstructive pulmonary disease, unspecified: Secondary | ICD-10-CM | POA: Diagnosis not present

## 2015-09-02 NOTE — Telephone Encounter (Signed)
[facility]

## 2015-09-02 NOTE — Telephone Encounter (Signed)
See other telephone message concerning this issue.

## 2015-09-06 NOTE — Telephone Encounter (Signed)
Wife states home health is handing this and for [place] to shred form for order for leg bag.

## 2015-09-07 ENCOUNTER — Ambulatory Visit (INDEPENDENT_AMBULATORY_CARE_PROVIDER_SITE_OTHER): Payer: 59 | Admitting: Urology

## 2015-09-07 DIAGNOSIS — N139 Obstructive and reflux uropathy, unspecified: Secondary | ICD-10-CM

## 2015-09-07 DIAGNOSIS — N401 Enlarged prostate with lower urinary tract symptoms: Secondary | ICD-10-CM

## 2015-09-07 DIAGNOSIS — R351 Nocturia: Secondary | ICD-10-CM | POA: Diagnosis not present

## 2015-09-07 MED FILL — SPIRIVA 18 MCG CP-HANDIHALE: 18 | 90 days supply | Qty: 90 | Fill #1

## 2015-09-07 MED FILL — FINASTERIDE 5 MG TABLET: 5 | 30 days supply | Qty: 30 | Fill #0

## 2015-09-07 MED FILL — ALBUTEROL 0.083% INHAL SOLN: (2.5 MG/3ML | 10 days supply | Qty: 150 | Fill #2

## 2015-09-08 ENCOUNTER — Emergency Department (HOSPITAL_COMMUNITY)
Admission: EM | Admit: 2015-09-08 | Discharge: 2015-09-08 | Disposition: A | Discharge status: Home / Self Care | Payer: 59 | Attending: Emergency Medicine | Admitting: Emergency Medicine

## 2015-09-08 ENCOUNTER — Encounter (HOSPITAL_COMMUNITY): Payer: Self-pay

## 2015-09-08 DIAGNOSIS — I110 Hypertensive heart disease with heart failure: Secondary | ICD-10-CM | POA: Diagnosis not present

## 2015-09-08 DIAGNOSIS — I252 Old myocardial infarction: Secondary | ICD-10-CM | POA: Insufficient documentation

## 2015-09-08 DIAGNOSIS — I509 Heart failure, unspecified: Secondary | ICD-10-CM | POA: Diagnosis not present

## 2015-09-08 DIAGNOSIS — E785 Hyperlipidemia, unspecified: Secondary | ICD-10-CM | POA: Insufficient documentation

## 2015-09-08 DIAGNOSIS — I2510 Atherosclerotic heart disease of native coronary artery without angina pectoris: Secondary | ICD-10-CM | POA: Insufficient documentation

## 2015-09-08 DIAGNOSIS — R339 Retention of urine, unspecified: Secondary | ICD-10-CM | POA: Insufficient documentation

## 2015-09-08 DIAGNOSIS — J449 Chronic obstructive pulmonary disease, unspecified: Secondary | ICD-10-CM | POA: Insufficient documentation

## 2015-09-08 DIAGNOSIS — F17210 Nicotine dependence, cigarettes, uncomplicated: Secondary | ICD-10-CM | POA: Diagnosis not present

## 2015-09-08 DIAGNOSIS — F329 Major depressive disorder, single episode, unspecified: Secondary | ICD-10-CM | POA: Insufficient documentation

## 2015-09-08 DIAGNOSIS — N390 Urinary tract infection, site not specified: Secondary | ICD-10-CM | POA: Diagnosis not present

## 2015-09-08 LAB — URINE MICROSCOPIC-ADD ON

## 2015-09-08 LAB — URINALYSIS, ROUTINE W REFLEX MICROSCOPIC
Bilirubin Urine: NEGATIVE
GLUCOSE, UA: NEGATIVE mg/dL
Ketones, ur: NEGATIVE mg/dL
Nitrite: NEGATIVE
PROTEIN: NEGATIVE mg/dL
SPECIFIC GRAVITY, URINE: 1.01 (ref 1.005–1.030)
pH: 5 (ref 5.0–8.0)

## 2015-09-08 MED ORDER — CEPHALEXIN 500 MG PO CAPS: 500.0000 mg | ORAL_CAPSULE | Freq: Four times a day (QID) | ORAL | Status: DC

## 2015-09-08 MED ORDER — CEPHALEXIN 500 MG PO CAPS
500.0000 mg | ORAL_CAPSULE | Freq: Once | ORAL | Status: AC
  Administered 2015-09-08: 500 mg via ORAL
  Filled 2015-09-08: qty 1

## 2015-09-08 MED FILL — VENTOLIN HFA 90 MCG INHALER: 108 (90 BAS | 25 days supply | Qty: 18 | Fill #4

## 2015-09-08 NOTE — ED Notes (Signed)
Pt reports having foley removed today at Dr office on 09/07/15 and has not been able to urinate since 5:00pm on 09/07/15 and urinated only a very small amount.

## 2015-09-08 NOTE — ED Provider Notes (Signed)
TIME SEEN: 2:50 AM  CHIEF COMPLAINT: Urinary retention  HPI: Pt is a 75 y.o. male with history of CAD, COPD, CHF on home oxygen he presents to the emergency department with urinary retention. States he has had urinary retention intermittently for the past few months. Reports he's had had multiple Foley catheters placed. He states that normally after they're removed he has to have another one placed again for urinary retention. Denies any medications. No new back pain, numbness, vomiting or focal weakness. States was able to last year and a small amount at 5 PM. His last Foley catheter was removed and the doctor's office yesterday. States that he is seeing [facility]. No pain with urination. No hematuria. No fever. Reports he is otherwise in his normal state of health. States he is only on medication for enlarged prostate and states he is continuing to take this medication.  ROS: See HPI Constitutional: no fever  Eyes: no drainage  ENT: no runny nose   Cardiovascular:  no chest pain  Resp: no SOB  GI: no vomiting GU: no dysuria Integumentary: no rash  Allergy: no hives  Musculoskeletal: no leg swelling  Neurological: no slurred speech ROS otherwise negative  PAST MEDICAL HISTORY/PAST SURGICAL HISTORY:  Past Medical History  Diagnosis Date  . AAA (abdominal aortic aneurysm) ([place])   . Hyperlipidemia     takes Atorvastatin daily  . CAD (coronary artery disease)   . Ulcer     gastric  . Aneurysm ([place])   . Anxiety     takes Xanax daily as needed  . Depression     takes Celexa daily  . Hypertension     takes Metoprolol and Benazepril  daily  . Peripheral edema     takes Furosemide daily  . Myocardial infarction ([place])     20+yrs ago  . COPD (chronic obstructive pulmonary disease) ([facility])     Albuterol inhaler and neb daily as needed;takes SPiriva daily  . History of shingles   . CHF (congestive heart failure) ([place])   . Cancer ([place])   . Shortness of breath      on oxygen  2  liters continuous  . Headache(784.0)     occasionally    MEDICATIONS:  Prior to Admission medications   Medication Sig Start Date End Date Taking? Authorizing Provider  albuterol (PROVENTIL) (2.5 MG/3ML) 0.083% nebulizer solution USE 1 VIAL IN NEBULIZER EVERY 4 HOURS AS NEEDED Patient taking differently: USE 1 VIAL IN NEBULIZER EVERY 4 HOURS AS NEEDED WHEEZING. 02/08/15   [name], MD  aspirin 81 MG tablet Take 81 mg by mouth daily.    Historical Provider, MD  atorvastatin (LIPITOR) 80 MG tablet Take 1 tablet (80 mg total) by mouth daily. 06/06/15   [name], MD  benazepril (LOTENSIN) 5 MG tablet Take 1 tablet (5 mg total) by mouth daily. 06/06/15   [name], MD  budesonide-formoterol (SYMBICORT) 160-4.5 MCG/ACT inhaler Inhale 2 puffs into the lungs 2 (two) times daily. 06/06/15   [name], MD  cefPROZIL (CEFZIL) 500 MG tablet Take 1 tablet (500 mg total) by mouth 2 (two) times daily. Patient not taking: Reported on 07/14/2015 06/30/15   [name], MD  citalopram (CELEXA) 20 MG tablet 1 qd 06/06/15   [name], MD  HYDROcodone-acetaminophen (NORCO) 10-325 MG tablet Take 1 tablet by mouth every 6 (six) hours as needed for severe pain. 07/14/15   [name], MD  metoprolol succinate (TOPROL-XL) 25  MG 24 hr tablet Take 0.5 tablets (12.5 mg total) by mouth 2 (two) times daily. 08/01/15   [name], MD  nicotine (NICODERM CQ) 14 mg/24hr patch Place 1 patch (14 mg total) onto the skin daily. 06/06/15   [name], MD  nitroGLYCERIN (NITROSTAT) 0.4 MG SL tablet Place 1 tablet (0.4 mg total) under the tongue every 5 (five) minutes as needed for chest pain. 08/02/15   [name], MD  potassium chloride SA (K-DUR,KLOR-CON) 20 MEQ tablet TAKE 1 TABLET BY MOUTH DAILY. 04/11/15   Arnoldo Lenis, MD  tamsulosin (FLOMAX) 0.4 MG CAPS capsule Take 2 capsules (0.8 mg total) by mouth at bedtime. 06/30/15   [name], MD  tiotropium (SPIRIVA HANDIHALER) 18 MCG  inhalation capsule PLACE 1 CAPSULE INTO INHALER AND INHALE DAILY 06/06/15   [name], MD  torsemide (DEMADEX) 20 MG tablet Take 2 tablets (40 mg total) by mouth daily. 07/14/15   [name], MD  UNABLE TO FIND Place 2 L into the nose continuous. Oxygen 2 liters.    Historical Provider, MD  VENTOLIN HFA 108 (90 BASE) MCG/ACT inhaler INHALE 2 PUFFS BY MOUTH EVERY 4-6 HOURS AS NEEDED 11/24/14   [name], MD    ALLERGIES:  No Known Allergies  SOCIAL HISTORY:  Social History  Substance Use Topics  . Smoking status: Light Tobacco Smoker -- 0.25 packs/day for 58 years    Types: Cigarettes  . Smokeless tobacco: Former Systems developer     Comment: vapor ciggs  . Alcohol Use: No    FAMILY HISTORY: Family History  Problem Relation Age of Onset  . Hypertension Brother   . Heart disease Brother     Heart Disease before age 10  . Heart attack Brother   . Cancer Brother   . Cancer Mother   . Diabetes Mother   . Heart disease Mother     Heart Disease before age 72  . Hypertension Mother   . Heart attack Mother   . Cancer Father     prostate  . Hypertension Father   . COPD Sister   . Diabetes Sister   . Heart disease Sister   . Hypertension Sister   . Heart attack Sister   . Colon cancer Neg Hx     EXAM: BP 120/88 mmHg  Pulse 78  Temp(Src) 97.4 F (36.3 C) (Oral)  Ht 5\' 7"  (1.702 m)  Wt 133 lb (60.328 kg)  BMI 20.83 kg/m2  SpO2 100% CONSTITUTIONAL: Alert and oriented and responds appropriately to questions. chronically ill appearing, thin, elderly, in no distress HEAD: Normocephalic EYES: Conjunctivae clear, PERRL ENT: normal nose; no rhinorrhea; moist mucous membranes NECK: Supple, no meningismus, no LAD  CARD: RRR; S1 and S2 appreciated; no murmurs, no clicks, no rubs, no gallops RESP: Normal chest excursion without splinting or tachypnea; breath sounds clear and equal bilaterally; no wheezes, no rhonchi, no rales, no hypoxia or respiratory distress, speaking full  sentences ABD/GI: Normal bowel sounds; non-distended; soft, mildly tender in the suprapubic region, no rebound, no guarding, no peritoneal signs GU:  Normal external genitalia, circumcised male, normal penile shaft, no blood or discharge at the urethral meatus, no testicular masses or tenderness on exam, no scrotal masses or swelling, no hernias appreciated, 2+ femoral pulses bilaterally; no perineal erythema, warmth, subcutaneous air or crepitus; no high riding testicle, normal bilateral cremasteric reflex.  Chaperone present for exam. BACK:  The back appears normal and is non-tender to palpation,  there is no CVA tenderness EXT: Normal ROM in all joints; non-tender to palpation; no edema; normal capillary refill; no cyanosis, no calf tenderness or swelling    SKIN: Normal color for age and race; warm; no rash NEURO: Moves all extremities equally, sensation to light touch intact diffusely, cranial nerves II through XII intact PSYCH: The patient's mood and manner are appropriate. Grooming and personal hygiene are appropriate.  MEDICAL DECISION MAKING: Patient here with recurrent urinary retention. Had a Foley catheter removed yesterday. Has over 1000 mL of urine in his bladder on bladder scan. Foley catheter placed without difficulty. Patient refills much better. Urinalysis pending. No back pain or neurologic deficits. No new medications. He is on Flomax. Has a urologist for follow-up.  ED PROGRESS: Urine shows small hemoglobin, trace leukocytes and many bacteria. Culture is pending. We'll discharge him on Keflex for the next week. He has had previous urine cultures with no growth. Advise close urology follow-up. He has a PCP as well. Discussed return precautions and Foley catheter care. He verbalizes understanding and is comfortable with this plan. Patient lives with his wife.    At this time, I do not feel there is any life-threatening condition present. I have reviewed and discussed all results  (EKG, imaging, lab, urine as appropriate), exam findings with patient. I have reviewed nursing notes and appropriate previous records.  I feel the patient is safe to be discharged home without further emergent workup. Discussed usual and customary return precautions. Patient and family (if present) verbalize understanding and are comfortable with this plan.  Patient will follow-up with their primary care provider. If they do not have a primary care provider, information for follow-up has been provided to them. All questions have been answered.    [place], DO 09/08/15 [phone]

## 2015-09-08 NOTE — Discharge Instructions (Signed)
Acute Urinary Retention, Male Acute urinary retention is the temporary inability to urinate. This is a common problem in older men. As men age their prostates become larger and block the flow of urine from the bladder. This is usually a problem that has come on gradually.  HOME CARE INSTRUCTIONS If you are sent home with a Foley catheter and a drainage system, you will need to discuss the best course of action with your health care provider. While the catheter is in, maintain a good intake of fluids. Keep the drainage bag emptied and lower than your catheter. This is so that contaminated urine will not flow back into your bladder, which could lead to a urinary tract infection. There are two main types of drainage bags. One is a large bag that usually is used at night. It has a good capacity that will allow you to sleep through the night without having to empty it. The second type is called a leg bag. It has a smaller capacity, so it needs to be emptied more frequently. However, the main advantage is that it can be attached by a leg strap and can go underneath your clothing, allowing you the freedom to move about or leave your home. Only take over-the-counter or prescription medicines for pain, discomfort, or fever as directed by your health care provider.  SEEK MEDICAL CARE IF:  You develop a low-grade fever.  You experience spasms or leakage of urine with the spasms. SEEK IMMEDIATE MEDICAL CARE IF:   You develop chills or fever.  Your catheter stops draining urine.  Your catheter falls out.  You start to develop increased bleeding that does not respond to rest and increased fluid intake. MAKE SURE YOU:  Understand these instructions.  Will watch your condition.  Will get help right away if you are not doing well or get worse.   This information is not intended to replace advice given to you by your health care provider. Make sure you discuss any questions you have with your health care  provider.   Document Released: 07/30/2000 Document Revised: 09/07/2014 Document Reviewed: 10/02/2012 Elsevier Interactive Patient Education 2016 [employer]  Urinary Tract Infection Urinary tract infections (UTIs) can develop anywhere along your urinary tract. Your urinary tract is your body's drainage system for removing wastes and extra water. Your urinary tract includes two kidneys, two ureters, a bladder, and a urethra. Your kidneys are a pair of bean-shaped organs. Each kidney is about the size of your fist. They are located below your ribs, one on each side of your spine. CAUSES Infections are caused by microbes, which are microscopic organisms, including fungi, viruses, and bacteria. These organisms are so small that they can only be seen through a microscope. Bacteria are the microbes that most commonly cause UTIs. SYMPTOMS  Symptoms of UTIs may vary by age and gender of the patient and by the location of the infection. Symptoms in young women typically include a frequent and intense urge to urinate and a painful, burning feeling in the bladder or urethra during urination. Older women and men are more likely to be tired, shaky, and weak and have muscle aches and abdominal pain. A fever may mean the infection is in your kidneys. Other symptoms of a kidney infection include pain in your back or sides below the ribs, nausea, and vomiting. DIAGNOSIS To diagnose a UTI, your caregiver will ask you about your symptoms. Your caregiver will also ask you to provide a urine sample. The urine sample  sample will be tested for bacteria and white blood cells. White blood cells are made by your body to help fight infection. °TREATMENT  °Typically, UTIs can be treated with medication. Because most UTIs are caused by a bacterial infection, they usually can be treated with the use of antibiotics. The choice of antibiotic and length of treatment depend on your symptoms and the type of bacteria causing your  infection. °HOME CARE INSTRUCTIONS °· If you were prescribed antibiotics, take them exactly as your caregiver instructs you. Finish the medication even if you feel better after you have only taken some of the medication. °· Drink enough water and fluids to keep your urine clear or pale yellow. °· Avoid caffeine, tea, and carbonated beverages. They tend to irritate your bladder. °· Empty your bladder often. Avoid holding urine for long periods of time. °· Empty your bladder before and after sexual intercourse. °· After a bowel movement, women should cleanse from front to back. Use each tissue only once. °SEEK MEDICAL CARE IF:  °· You have back pain. °· You develop a fever. °· Your symptoms do not begin to resolve within 3 days. °SEEK IMMEDIATE MEDICAL CARE IF:  °· You have severe back pain or lower abdominal pain. °· You develop chills. °· You have nausea or vomiting. °· You have continued burning or discomfort with urination. °MAKE SURE YOU:  °· Understand these instructions. °· Will watch your condition. °· Will get help right away if you are not doing well or get worse. °  °This information is not intended to replace advice given to you by your health care provider. Make sure you discuss any questions you have with your health care provider. °  °Document Released: 01/31/2005 Document Revised: 01/12/2015 Document Reviewed: 06/01/2011 °Elsevier Interactive Patient Education ©2016 Elsevier Inc. ° °

## 2015-09-09 DIAGNOSIS — J9620 Acute and chronic respiratory failure, unspecified whether with hypoxia or hypercapnia: Secondary | ICD-10-CM | POA: Diagnosis not present

## 2015-09-09 DIAGNOSIS — J441 Chronic obstructive pulmonary disease with (acute) exacerbation: Secondary | ICD-10-CM | POA: Diagnosis not present

## 2015-09-09 DIAGNOSIS — Z466 Encounter for fitting and adjustment of urinary device: Secondary | ICD-10-CM | POA: Diagnosis not present

## 2015-09-10 LAB — URINE CULTURE: Culture: >=100000 — AB

## 2015-09-12 ENCOUNTER — Telehealth (HOSPITAL_BASED_OUTPATIENT_CLINIC_OR_DEPARTMENT_OTHER): Payer: Self-pay | Admitting: Emergency Medicine

## 2015-09-12 NOTE — Telephone Encounter (Signed)
Post ED Visit - Positive Culture Follow-up  Culture report reviewed by antimicrobial stewardship pharmacist:  []  Elenor Quinones, Pharm.D. []  Heide Guile, Pharm.D., BCPS []  Parks Neptune, Pharm.D. []  Alycia Rossetti, Pharm.D., BCPS []  [place], Pharm.D., BCPS, AAHIVP []  Legrand Como, Pharm.D., BCPS, AAHIVP [x]  Milus Glazier, Pharm.D. []  Stephens November, Pharm.D.  Positive URINE culture Treated with cephalexin,  sensitive to the same and no further patient follow-up is required at this time.  Hazle Nordmann 09/12/2015, 10:14 AM

## 2015-09-12 NOTE — Progress Notes (Signed)
ED Antimicrobial Stewardship Positive Culture Follow Up   Tommy Turner is an 75 y.o. male who presented to [facility] on 09/08/2015 with a chief complaint of  Chief Complaint  Patient presents with  . Urinary Retention    Recent Results (from the past 720 hour(s))  Urine culture     Status: Abnormal   Collection Time: 09/08/15  3:00 AM  Result Value Ref Range Status   Specimen Description URINE, CLEAN CATCH  Final   Special Requests NONE  Final   Culture (A)  Final    >=100,000 COLONIES/mL ENTEROBACTER CLOACAE 50,000 COLONIES/mL ENTEROCOCCUS SPECIES    Report Status 09/10/2015 FINAL  Final   Organism ID, Bacteria ENTEROBACTER CLOACAE (A)  Final   Organism ID, Bacteria ENTEROCOCCUS SPECIES (A)  Final      Susceptibility   Enterobacter cloacae - MIC*    CEFAZOLIN >=64 RESISTANT Resistant     CEFTRIAXONE <=1 SENSITIVE Sensitive     CIPROFLOXACIN <=0.25 SENSITIVE Sensitive     GENTAMICIN <=1 SENSITIVE Sensitive     IMIPENEM <=0.25 SENSITIVE Sensitive     NITROFURANTOIN 64 INTERMEDIATE Intermediate     TRIMETH/SULFA <=20 SENSITIVE Sensitive     PIP/TAZO 64 INTERMEDIATE Intermediate     * >=100,000 COLONIES/mL ENTEROBACTER CLOACAE   Enterococcus species - MIC*    AMPICILLIN <=2 SENSITIVE Sensitive     LEVOFLOXACIN 1 SENSITIVE Sensitive     NITROFURANTOIN <=16 SENSITIVE Sensitive     VANCOMYCIN 1 SENSITIVE Sensitive     * 50,000 COLONIES/mL ENTEROCOCCUS SPECIES    No urinary symptoms and UA negative. No treatment indicated.  ED Provider: Domenic Moras, PA-C  Cassie L. Nicole Kindred, PharmD PGY2 Infectious Diseases Pharmacy Resident Pager: [phone] 09/12/2015 9:35 AM

## 2015-09-27 MED FILL — ALBUTEROL 0.083% INHAL SOLN: (2.5 MG/3ML | 5 days supply | Qty: 75 | Fill #3

## 2015-09-28 ENCOUNTER — Other Ambulatory Visit: Payer: Self-pay

## 2015-09-28 DIAGNOSIS — Z466 Encounter for fitting and adjustment of urinary device: Secondary | ICD-10-CM | POA: Diagnosis not present

## 2015-09-28 DIAGNOSIS — J9620 Acute and chronic respiratory failure, unspecified whether with hypoxia or hypercapnia: Secondary | ICD-10-CM | POA: Diagnosis not present

## 2015-09-28 DIAGNOSIS — J441 Chronic obstructive pulmonary disease with (acute) exacerbation: Secondary | ICD-10-CM | POA: Diagnosis not present

## 2015-09-28 MED ORDER — VENTOLIN HFA 108 (90 BASE) MCG/ACT IN AERS
INHALATION_SPRAY | RESPIRATORY_TRACT | Status: DC
Start: 2015-09-28 — End: 2015-11-27

## 2015-09-28 MED FILL — VENTOLIN HFA 90 MCG INHALER: 108 (90 BAS | 25 days supply | Qty: 18 | Fill #0

## 2015-09-29 MED FILL — CITALOPRAM HBR 20 MG TABLET: 20 | 90 days supply | Qty: 45 | Fill #2

## 2015-10-02 DIAGNOSIS — J449 Chronic obstructive pulmonary disease, unspecified: Secondary | ICD-10-CM | POA: Diagnosis not present

## 2015-10-07 MED FILL — ALBUTEROL 0.083% INHAL SOLN: (2.5 MG/3ML | 10 days supply | Qty: 150 | Fill #4 | Status: TO

## 2015-10-10 MED FILL — FINASTERIDE 5 MG TABLET: 5 | 30 days supply | Qty: 30 | Fill #1

## 2015-10-10 MED FILL — TORSEMIDE 20 MG TABLET: 20 | 90 days supply | Qty: 180 | Fill #1

## 2015-10-14 ENCOUNTER — Ambulatory Visit: Payer: 59 | Admitting: Family Medicine

## 2015-10-21 ENCOUNTER — Ambulatory Visit (INDEPENDENT_AMBULATORY_CARE_PROVIDER_SITE_OTHER): Payer: 59 | Admitting: Urology

## 2015-10-21 ENCOUNTER — Other Ambulatory Visit (HOSPITAL_COMMUNITY)
Admission: RE | Admit: 2015-10-21 | Discharge: 2015-10-21 | Disposition: A | Discharge status: Home / Self Care | Payer: 59 | Source: Other Acute Inpatient Hospital | Attending: Urology | Admitting: Urology

## 2015-10-21 DIAGNOSIS — N401 Enlarged prostate with lower urinary tract symptoms: Secondary | ICD-10-CM | POA: Diagnosis not present

## 2015-10-21 DIAGNOSIS — R339 Retention of urine, unspecified: Secondary | ICD-10-CM | POA: Diagnosis not present

## 2015-10-21 DIAGNOSIS — R338 Other retention of urine: Secondary | ICD-10-CM

## 2015-10-22 LAB — URINE CULTURE

## 2015-10-24 MED FILL — KLOR-CON M20 TABLET: 20 | 30 days supply | Qty: 30 | Fill #0

## 2015-10-25 ENCOUNTER — Ambulatory Visit (INDEPENDENT_AMBULATORY_CARE_PROVIDER_SITE_OTHER): Payer: 59 | Admitting: Urology

## 2015-10-25 DIAGNOSIS — R338 Other retention of urine: Secondary | ICD-10-CM

## 2015-11-02 ENCOUNTER — Ambulatory Visit (INDEPENDENT_AMBULATORY_CARE_PROVIDER_SITE_OTHER): Payer: 59 | Admitting: Cardiology

## 2015-11-02 ENCOUNTER — Encounter: Payer: Self-pay | Admitting: Cardiology

## 2015-11-02 VITALS — BP 102/62 | HR 47 | Ht 67.0 in | Wt 126.0 lb

## 2015-11-02 DIAGNOSIS — I2510 Atherosclerotic heart disease of native coronary artery without angina pectoris: Secondary | ICD-10-CM

## 2015-11-02 DIAGNOSIS — I5022 Chronic systolic (congestive) heart failure: Secondary | ICD-10-CM

## 2015-11-02 DIAGNOSIS — J449 Chronic obstructive pulmonary disease, unspecified: Secondary | ICD-10-CM | POA: Diagnosis not present

## 2015-11-02 DIAGNOSIS — R001 Bradycardia, unspecified: Secondary | ICD-10-CM

## 2015-11-02 DIAGNOSIS — Z01810 Encounter for preprocedural cardiovascular examination: Secondary | ICD-10-CM

## 2015-11-02 DIAGNOSIS — I255 Ischemic cardiomyopathy: Secondary | ICD-10-CM

## 2015-11-02 NOTE — Progress Notes (Signed)
Clinical Summary Tommy Turner is a 75 y.o.male seen today for follow up seen today for follow up of the following medical problems.      1. CAD/ICM  - Reports history of MI 20 years ago at [facility]. Family reports CABG at that time.  - from Dr Earline Mayotte old notes CABG in 2000, op note not in our system..  - echo 07/2009 LVEF 30-35%. Repeat echo 12/29/13 LVEF 25-30%, multiple WMAs, grade I diastolic dysfunction, moderate RV dysfunction.  - 9/2015Lexiscan MPI, large scar apex and inferolateral wall, no ischemia, LVEF 24%.  - echo 04/2015 LVEF 20-25%, mod LAE, moderate RV dysfunction  - last visit started Toprol XL 12.5mg  daily, tolerating well without significant side effects  - stable SOB <1/2 block. No recent LE edema. Denies any chest pain - compliant with meds. Medication titration limited by low blood pressures. Mild orthostatic symptoms. - has not been interested in ICDs.   2. AAA - s/p repair,. EVAR 09/2014.  - followed by vascular  3. Urinary retention - has chronic foley - upcoming prostate surgery planned   Past Medical History  Diagnosis Date  . AAA (abdominal aortic aneurysm) ([place])   . Hyperlipidemia     takes Atorvastatin daily  . CAD (coronary artery disease)   . Ulcer     gastric  . Aneurysm ([place])   . Anxiety     takes Xanax daily as needed  . Depression     takes Celexa daily  . Hypertension     takes Metoprolol and Benazepril  daily  . Peripheral edema     takes Furosemide daily  . Myocardial infarction ([place])     20+yrs ago  . COPD (chronic obstructive pulmonary disease) ([facility])     Albuterol inhaler and neb daily as needed;takes SPiriva daily  . History of shingles   . CHF (congestive heart failure) ([place])   . Cancer ([place])   . Shortness of breath      on oxygen  2 liters continuous  . Headache(784.0)     occasionally     No Known Allergies   Current Outpatient Prescriptions  Medication Sig Dispense Refill  . albuterol (PROVENTIL)  (2.5 MG/3ML) 0.083% nebulizer solution USE 1 VIAL IN NEBULIZER EVERY 4 HOURS AS NEEDED (Patient taking differently: USE 1 VIAL IN NEBULIZER EVERY 4 HOURS AS NEEDED WHEEZING.) 150 mL PRN  . aspirin 81 MG tablet Take 81 mg by mouth daily.    Marland Kitchen atorvastatin (LIPITOR) 80 MG tablet Take 1 tablet (80 mg total) by mouth daily. 90 tablet 3  . benazepril (LOTENSIN) 5 MG tablet Take 1 tablet (5 mg total) by mouth daily. 90 tablet 3  . budesonide-formoterol (SYMBICORT) 160-4.5 MCG/ACT inhaler Inhale 2 puffs into the lungs 2 (two) times daily. 3 Inhaler 3  . cephALEXin (KEFLEX) 500 MG capsule Take 1 capsule (500 mg total) by mouth 4 (four) times daily. 28 capsule 0  . citalopram (CELEXA) 20 MG tablet 1 qd 90 tablet 3  . HYDROcodone-acetaminophen (NORCO) 10-325 MG tablet Take 1 tablet by mouth every 6 (six) hours as needed for severe pain. 60 tablet 0  . metoprolol succinate (TOPROL-XL) 25 MG 24 hr tablet Take 0.5 tablets (12.5 mg total) by mouth 2 (two) times daily. 90 tablet 1  . nicotine (NICODERM CQ) 14 mg/24hr patch Place 1 patch (14 mg total) onto the skin daily. 90 patch 1  . nitroGLYCERIN (NITROSTAT) 0.4 MG SL tablet Place 1 tablet (0.4 mg total)  under the tongue every 5 (five) minutes as needed for chest pain. 100 tablet 0  . potassium chloride SA (K-DUR,KLOR-CON) 20 MEQ tablet TAKE 1 TABLET BY MOUTH DAILY. 90 tablet 1  . tamsulosin (FLOMAX) 0.4 MG CAPS capsule Take 2 capsules (0.8 mg total) by mouth at bedtime. 180 capsule 2  . tiotropium (SPIRIVA HANDIHALER) 18 MCG inhalation capsule PLACE 1 CAPSULE INTO INHALER AND INHALE DAILY 90 capsule 3  . torsemide (DEMADEX) 20 MG tablet Take 2 tablets (40 mg total) by mouth daily. 180 tablet 2  . UNABLE TO FIND Place 2 L into the nose continuous. Oxygen 2 liters.    . VENTOLIN HFA 108 (90 Base) MCG/ACT inhaler INHALE 2 PUFFS BY MOUTH EVERY 4-6 HOURS AS NEEDED 18 g 5   No current facility-administered medications for this visit.     Past Surgical History    Procedure Laterality Date  . Lipoma excision      right lower back  . Partial gastrectomy      approx 1990  . Hernia repair Left     inguinal  . Tonsillectomy      as a child  . Esophagogastroduodenoscopy    . Coronary artery bypass graft  20+yrs ago    x 6  . Abdominal aortic endovascular stent graft N/A 09/14/2014    Procedure: ABDOMINAL AORTIC ENDOVASCULAR STENT GRAFT;  Surgeon: Conrad LaGrange, MD;  Location: [facility] OR;  Service: Vascular;  Laterality: N/A;     No Known Allergies    Family History  Problem Relation Age of Onset  . Hypertension Brother   . Heart disease Brother     Heart Disease before age 64  . Heart attack Brother   . Cancer Brother   . Cancer Mother   . Diabetes Mother   . Heart disease Mother     Heart Disease before age 40  . Hypertension Mother   . Heart attack Mother   . Cancer Father     prostate  . Hypertension Father   . COPD Sister   . Diabetes Sister   . Heart disease Sister   . Hypertension Sister   . Heart attack Sister   . Colon cancer Neg Hx      Social History Mr. Moura reports that he has been smoking Cigarettes.  He has a 14.5 pack-year smoking history. He has quit using smokeless tobacco. Mr. Bone reports that he does not drink alcohol.   Review of Systems CONSTITUTIONAL: No weight loss, fever, chills, weakness or fatigue.  HEENT: Eyes: No visual loss, blurred vision, double vision or yellow sclerae.No hearing loss, sneezing, congestion, runny nose or sore throat.  SKIN: No rash or itching.  CARDIOVASCULAR: per HPI RESPIRATORY: No shortness of breath, cough or sputum.  GASTROINTESTINAL: No anorexia, nausea, vomiting or diarrhea. No abdominal pain or blood.  GENITOURINARY: No burning on urination, no polyuria NEUROLOGICAL: No headache, dizziness, syncope, paralysis, ataxia, numbness or tingling in the extremities. No change in bowel or bladder control.  MUSCULOSKELETAL: No muscle, back pain, joint pain or stiffness.   LYMPHATICS: No enlarged nodes. No history of splenectomy.  PSYCHIATRIC: No history of depression or anxiety.  ENDOCRINOLOGIC: No reports of sweating, cold or heat intolerance. No polyuria or polydipsia.  Marland Kitchen   Physical Examination Filed Vitals:   11/02/15 0844  BP: 102/62  Pulse: 47   Filed Vitals:   11/02/15 0844  Height: 5\' 7"  (1.702 m)  Weight: 126 lb (57.153 kg)    Gen: resting  comfortably, no acute distress HEENT: no scleral icterus, pupils equal round and reactive, no palptable cervical adenopathy,  CV: RRR, no m/r/g, no jvd Resp: Clear to auscultation bilaterally GI: abdomen is soft, non-tender, non-distended, normal bowel sounds, no hepatosplenomegaly MSK: extremities are warm, no edema.  Skin: warm, no rash Neuro:  no focal deficits Psych: appropriate affect   Diagnostic Studies Echo 12/2013 Study Conclusions  - Left ventricle: The cavity size was normal. Wall thickness was increased in a pattern of mild LVH. Systolic function was severely reduced. The estimated ejection fraction was in the range of 25% to 30%. There is akinesis of the inferior, inferoseptal, and apical myocardium. There is akinesis of the midanterolateral myocardium. Doppler parameters are consistent with abnormal left ventricular relaxation (grade 1 diastolic dysfunction). - Aortic valve: Mildly calcified annulus. Trileaflet; moderately calcified leaflets. There was overall mild stenosis. There was trivial regurgitation. Peak gradient (S): 11 mm Hg. Peak velocity ratio of LVOT to aortic valve: 0.43. - Mitral valve: Calcified annulus. Moderately thickened leaflets . There was mild regurgitation. - Left atrium: The atrium was moderately to severely dilated. - Right ventricle: The cavity size was mildly dilated. Systolic function was moderately reduced. - Right atrium: The atrium was mildly dilated. Central venous pressure (est): 8 mm Hg. - Atrial septum: No defect or patent foramen ovale  was identified. - Tricuspid valve: There was trivial regurgitation. - Pulmonary arteries: PA peak pressure: 58 mm Hg (S). - Pericardium, extracardiac: There was no pericardial effusion.  Impressions:  - Mild LVH with LVEF approximately 25-30%, wall motion abnormalities consistent with ischemic cardiomyopathy. Grade 1 diastolic dysfunction. Moderate to severe left atrial enlargement. Heavily calcified mitral annulus with mild mitral regurgitation. Moderately calcified aortic valve with evidence of mild aortic stenosis of relatively low gradient. There is trivial aortic regurgitation. Mild RV dilatation with moderately reduced contraction. Mildly dilated right atrium. PASP calculated at 58 mm mercury.  01/2014 Lexiscan MPI 1. Large area of scar in both the apex and also the inferolateral wall. There is no significant peri-infarct ischemia.  2. Severely reduced left ventricular systolic function. The LV is enlarged  3. Left ventricular ejection fraction 24%  4. High-risk stress test findings due to severe left ventricular systolic dysfunction and large area of scar. No current myocardium at jeopardy*.     Assessment and Plan   1. CAD/ICM/Chronic systolic HF - from prior notes hx of CABG in 2000 , LVEF 07/2009 at 30-35%  - repeat echo 12/2013, LVEF 25-30%, multiple WMAs. Recent lexiscan shows old scar, no active ischemia - he is NYHA II-III, we are working to optimize his medications. - soft bp and low heart rates, will not increase meds today - he has not been interested in ICD  2. Preoperative evaluation  - from cardiac standpoint he is at increased risk for urology procedure but not prohibitive risk. Recommend proceeding, continue cardiac meds in periop period. If neccesary ok to hold ASA   F/u 4 months      Arnoldo Lenis, M.D.

## 2015-11-02 NOTE — Patient Instructions (Signed)
Medication Instructions:  Your physician recommends that you continue on your current medications as directed. Please refer to the Current Medication list given to you today.  Labwork: none  Testing/Procedures: none  Follow-Up: Your physician wants you to follow-up in: 4 months.  You will receive a reminder letter in the mail two months in advance. If you don't receive a letter, please call our office to schedule the follow-up appointment.   Any Other Special Instructions Will Be Listed Below (If Applicable).     If you need a refill on your cardiac medications before your next appointment, please call your pharmacy.   

## 2015-11-07 MED FILL — FINASTERIDE 5 MG TABLET: 5 | 30 days supply | Qty: 30 | Fill #2

## 2015-11-14 ENCOUNTER — Other Ambulatory Visit: Payer: Self-pay | Admitting: Urology

## 2015-11-18 ENCOUNTER — Ambulatory Visit (INDEPENDENT_AMBULATORY_CARE_PROVIDER_SITE_OTHER): Payer: 59 | Admitting: Family Medicine

## 2015-11-18 ENCOUNTER — Encounter: Payer: Self-pay | Admitting: Family Medicine

## 2015-11-18 VITALS — BP 120/54 | Ht 67.0 in | Wt 130.0 lb

## 2015-11-18 DIAGNOSIS — F419 Anxiety disorder, unspecified: Secondary | ICD-10-CM

## 2015-11-18 DIAGNOSIS — G894 Chronic pain syndrome: Secondary | ICD-10-CM

## 2015-11-18 DIAGNOSIS — R634 Abnormal weight loss: Secondary | ICD-10-CM

## 2015-11-18 DIAGNOSIS — I255 Ischemic cardiomyopathy: Secondary | ICD-10-CM

## 2015-11-18 DIAGNOSIS — J438 Other emphysema: Secondary | ICD-10-CM | POA: Diagnosis not present

## 2015-11-18 DIAGNOSIS — R338 Other retention of urine: Secondary | ICD-10-CM

## 2015-11-18 DIAGNOSIS — N400 Enlarged prostate without lower urinary tract symptoms: Secondary | ICD-10-CM

## 2015-11-18 DIAGNOSIS — N401 Enlarged prostate with lower urinary tract symptoms: Secondary | ICD-10-CM

## 2015-11-18 MED ORDER — HYDROCODONE-ACETAMINOPHEN 10-325 MG PO TABS: 1.0000 | ORAL_TABLET | Freq: Four times a day (QID) | ORAL | Status: DC | PRN

## 2015-11-18 MED ORDER — CLONAZEPAM 0.5 MG PO TABS: 0.5000 mg | ORAL_TABLET | Freq: Two times a day (BID) | ORAL | Status: DC | PRN

## 2015-11-18 NOTE — Progress Notes (Signed)
   Subjective:    Patient ID: Tommy Turner, male    DOB: [birth year], 75 y.o.   MRN: [id number]  HPI This patient was seen today for chronic pain  The medication list was reviewed and updated.   -Compliance with medication: Takes as needed.  - Number patient states they take daily: 1 to 2 tablets.  -when was the last dose patient took? Yesterday.  The patient was advised the importance of maintaining medication and not using illegal substances with these.  Refills needed: Yes The patient was educated that we can provide 3 monthly scripts for their medication, it is their responsibility to follow the instructions.  Side effects or complications from medications: None  Patient is aware that pain medications are meant to minimize the severity of the pain to allow their pain levels to improve to allow for better function. They are aware of that pain medications cannot totally remove their pain.  Due for UDT ( at least once per year) : I have chosen not to do urine drug screen for this patient Patient does pain medication some days none some days one or 2 maximum 3 he is very responsible with his medicines. He is under the care of his wife. He has very guarded prognosis. Medication does not cause drowsiness or respiratory depression Long discussion held regarding his COPD He relates a lot of shortness of breath but denies high fevers chills sweats States oxygen helps Denies any chest tightness pressure pain or fever Is wearing a catheter Is having surgery later this month States his moods overall are doing fairly good  Improvement of caloric intake discussed.    Review of Systems Patient does have significant shortness of breath with activity   patient denies high fever chills sweats nausea vomiting diarrhea Objective:   Physical Exam  Cachectic, lungs are clear hearts regular abdomen soft extremities no edema skin warm dry neurologic grossly normal      Assessment & Plan:    Severe COPD oxygen dependent very guarded prognosis. Both patient and wife thinking positively do not want to think about death. They are hopeful that various treatments will help him live a long time. They are aware that a serious illness could cause premature death Currently right now they are not interested in talking about end-of-life issues Chronic pain uses pain medications infrequently pain medicine often lasted good 2 months at a time 3 scripts given Patient COPD's taken his medicines as directed uses oxygen appropriately Patient denies being depressed but does do well with taking citalopram low-dose daily Having severe problems with BPH is having surgery later this month hopefully this will help Anxiety related issues states he does not use nerve pills much Klonopin written he may take twice a day as needed caution drowsiness not to be using the same time his pain medicine 25 minutes was spent with the patient. Greater than half the time was spent in discussion and answering questions and counseling regarding the issues that the patient came in for today. Follow-up in 3-4 months

## 2015-11-21 ENCOUNTER — Encounter (HOSPITAL_COMMUNITY): Payer: Self-pay

## 2015-11-21 ENCOUNTER — Encounter (HOSPITAL_COMMUNITY)
Admission: RE | Admit: 2015-11-21 | Discharge: 2015-11-21 | Disposition: A | Discharge status: Home / Self Care | Payer: 59 | Source: Ambulatory Visit | Attending: Urology | Admitting: Urology

## 2015-11-21 ENCOUNTER — Ambulatory Visit (HOSPITAL_COMMUNITY)
Admission: RE | Admit: 2015-11-21 | Discharge: 2015-11-21 | Disposition: A | Discharge status: Home / Self Care | Payer: 59 | Source: Ambulatory Visit | Attending: Anesthesiology | Admitting: Anesthesiology

## 2015-11-21 DIAGNOSIS — Z01818 Encounter for other preprocedural examination: Secondary | ICD-10-CM

## 2015-11-21 DIAGNOSIS — Z951 Presence of aortocoronary bypass graft: Secondary | ICD-10-CM | POA: Diagnosis not present

## 2015-11-21 LAB — BASIC METABOLIC PANEL
ANION GAP: 4 — AB (ref 5–15)
BUN: 14 mg/dL (ref 6–20)
CALCIUM: 9.1 mg/dL (ref 8.9–10.3)
CHLORIDE: 97 mmol/L — AB (ref 101–111)
CO2: 37 mmol/L — AB (ref 22–32)
CREATININE: 0.55 mg/dL — AB (ref 0.61–1.24)
Glucose, Bld: 107 mg/dL — ABNORMAL HIGH (ref 65–99)
POTASSIUM: 3.9 mmol/L (ref 3.5–5.1)
Sodium: 138 mmol/L (ref 135–145)

## 2015-11-21 LAB — CBC
HCT: 39.6 % (ref 39.0–52.0)
HEMOGLOBIN: 12.6 g/dL — AB (ref 13.0–17.0)
MCH: 30.3 pg (ref 26.0–34.0)
MCHC: 31.8 g/dL (ref 30.0–36.0)
MCV: 95.2 fL (ref 78.0–100.0)
PLATELETS: 146 10*3/uL — AB (ref 150–400)
RBC: 4.16 MIL/uL — AB (ref 4.22–5.81)
RDW: 13.9 % (ref 11.5–15.5)
WBC: 8 10*3/uL (ref 4.0–10.5)

## 2015-11-21 NOTE — Progress Notes (Signed)
Recalled office of [facility] at [facility] and left another message to make sure Dr Harl Bowie and nurse had received telephone message along with EPIC note routed to them regarding patient's blood pressure at preop visit on 11/21/15.

## 2015-11-21 NOTE — Progress Notes (Addendum)
EKG- 11/02/2015 in EPIC  ECHO-05/03/15- EPIC  LOV- Cardiology- 11/02/2015-EPIC - clearance in note.

## 2015-11-21 NOTE — Progress Notes (Signed)
BMP done 11/21/15 routed via EPIC to Dr Jeffie Pollock.

## 2015-11-21 NOTE — Patient Instructions (Signed)
Tommy Turner  11/21/2015   Your procedure is scheduled on: 11/24/2015    Report to [facility] Main  Entrance take [place]  elevators to 3rd floor to  [place] at    [place] AM.  Call this number if you have problems the morning of surgery [phone]   Remember: ONLY 1 PERSON MAY GO WITH YOU TO SHORT STAY TO GET  READY MORNING OF [place].  Do not eat food or drink liquids :After Midnight.     Take these medicines the morning of surgery with A SIP OF WATER: Albuterol Nebulizer if needed and bring, citalopram ( Celexa), Proscar ( Finasteride), Hydrocodone i fneeded, Metoprolol, spiriva  Ventolin Inhaler .  Bring Inhalers Clonazepam if needed                                 You may not have any metal on your body including hair pins and              piercings  Do not wear jewelry, , lotions, powders or perfumes, deodorant                        Men may shave face and neck.   Do not bring valuables to the hospital. [place].  Contacts, dentures or bridgework may not be worn into surgery.  Leave suitcase in the car. After surgery it may be brought to your room.         Special Instructions:               Please read over the following fact sheets you were given: _____________________________________________________________________             [facility] - Preparing for Surgery Before surgery, you can play an important role.  Because skin is not sterile, your skin needs to be as free of germs as possible.  You can reduce the number of germs on your skin by washing with CHG (chlorahexidine gluconate) soap before surgery.  CHG is an antiseptic cleaner which kills germs and bonds with the skin to continue killing germs even after washing. Please DO NOT use if you have an allergy to CHG or antibacterial soaps.  If your skin becomes reddened/irritated stop using the CHG and inform your nurse when you  arrive at Short Stay. Do not shave (including legs and underarms) for at least 48 hours prior to the first CHG shower.  You may shave your face/neck. Please follow these instructions carefully:  1.  Shower with CHG Soap the night before surgery and the  morning of Surgery.  2.  If you choose to wash your hair, wash your hair first as usual with your  normal  shampoo.  3.  After you shampoo, rinse your hair and body thoroughly to remove the  shampoo.                           4.  Use CHG as you would any other liquid soap.  You can apply chg directly  to the skin and wash  Gently with a scrungie or clean washcloth.  5.  Apply the CHG Soap to your body ONLY FROM THE NECK DOWN.   Do not use on face/ open                           Wound or open sores. Avoid contact with eyes, ears mouth and genitals (private parts).                       Wash face,  Genitals (private parts) with your normal soap.             6.  Wash thoroughly, paying special attention to the area where your surgery  will be performed.  7.  Thoroughly rinse your body with warm water from the neck down.  8.  DO NOT shower/wash with your normal soap after using and rinsing off  the CHG Soap.                9.  Pat yourself dry with a clean towel.            10.  Wear clean pajamas.            11.  Place clean sheets on your bed the night of your first shower and do not  sleep with pets. Day of Surgery : Do not apply any lotions/deodorants the morning of surgery.  Please wear clean clothes to the hospital/surgery center.  FAILURE TO FOLLOW THESE INSTRUCTIONS MAY RESULT IN THE CANCELLATION OF YOUR SURGERY PATIENT SIGNATURE_________________________________  NURSE SIGNATURE__________________________________  ________________________________________________________________________

## 2015-11-21 NOTE — Progress Notes (Signed)
Called and left message at [facility] regarding progress note from today preop appointment.  Left 2 phone numbers I could be reached at [phone] and [phone].

## 2015-11-21 NOTE — Progress Notes (Signed)
Patient seen on preop appt at 1100am.  At beginning of preop visit O2 SAT WAS 89-90.  Patient's wife reported o2 sat at home in the 48's [place].  Wife states O2 runs in the 36s outside home.  Patient in wheelchair in no acute distress.  Alert and oriented x 3.  Oxygen at 2L/[place] with portable tank.  Respirations even and unlabored.  No wheezing noted.  Blood pressure on initial -87/55.  At end of interview rechecked blood pressure in left arm was 84/52.  Patient denies any dizziness or lightheadedness.  Pulse- 57.  At 1130am blood pressure was 86/60 in right arm.  Spoke with Dr Laurie Panda ( anesthesia ) with all of above information.  Dr Ermalene Postin stated to tell family he may be cancelled am of surgery in regards to if blood pressure runs low or depending on pulmonary status.  Wife and patient aware.  Dr Laurie Panda ordered me to monitor blood pressure for  Approximately 30 minutes and to recheck Patient reports having eaten this am.  Emptied foley bag of 700cc of urine.  At 12 noon- rechecked blood pressure in right arm was 99/56.  Pulse- 55.  Patient drank 30 ounces of water approximately .  Patient at 12 noon asymptomatic .  Spoke with Dr Laurie Panda ( anesthesia ) in regards to 99/56 blood pressure.  I informed Dr Laurie Panda I would let Dr   Harl Bowie ( cardiology be aware of above.  DR Ermalene Postin stated ok to discharge patient after CXR completed.  CXR completed in Radiology and patient was discharged in car with wife .

## 2015-11-21 NOTE — Progress Notes (Signed)
Office staff called from [facility] in [place] and stated they had received telephone message I sent regarding patient and that Dr Harl Bowie would see note today regarding routed message I had sent.

## 2015-11-22 ENCOUNTER — Ambulatory Visit (INDEPENDENT_AMBULATORY_CARE_PROVIDER_SITE_OTHER): Payer: 59 | Admitting: Urology

## 2015-11-22 DIAGNOSIS — R338 Other retention of urine: Secondary | ICD-10-CM | POA: Diagnosis not present

## 2015-11-23 NOTE — H&P (Signed)
CC: I have symptoms of an enlarged prostate.  HPI: Tommy Turner is a 75 year-old male established patient who is here for symptoms of enlarged prostate.  He has been treated with Flomax and Proscar. The patient has never had a surgical procedure for bladder outlet obstruction to his prostate.   Tommy Turner returns today in f/u. He has a history of BPH with BOO for several years and went into retention in May. He is currently on finasteride and tamsulosin. He passed a voiding trial at his last visit with Dr. Alyson Ingles last month but had to return to the ER for a foley catheter replacement that night. He still has the foley. He had a cystoscopy in March that showed BPH with Bilobar hyperplasia with obstruction.      ALLERGIES: No Allergies    MEDICATIONS: Albuterol AERS Inhalation  Aspirin 81 MG TABS Oral  Finasteride 5 MG Oral Tablet 0 Oral  Furosemide 20 MG Oral Tablet Oral  Lasix 40 MG Oral Tablet Oral  Lipitor 80 MG Oral Tablet Oral  Lotensin TABS Oral  Metoprolol Succinate ER 50 MG Oral Tablet Extended Release 24 Hour Oral  Nitroglycerin 0.4 MG Sublingual Tablet Sublingual Sublingual  Norco 10-325 MG Oral Tablet Oral  Oxygen Use  Oxygen Use  Potassium Chloride 20 MEQ TBCR Oral  Potassium Chloride 20 MEQ TBCR Oral  Pravastatin Sodium 40 MG Oral Tablet Oral  Spiriva HandiHaler 18 MCG Inhalation Capsule Inhalation  Symbicort 160-4.5 MCG/ACT Inhalation Aerosol Inhalation  Tamsulosin HCl - 0.4 MG Oral Capsule 0 Oral Daily  Tamsulosin HCl - 0.4 MG Oral Capsule 0 Oral Daily  Toprol XL 25 MG Oral Tablet Extended Release 24 Hour Oral  Xanax 1 MG Oral Tablet Oral     GU PSH: Hernia Repair - 2011      PSH Notes: Surgery For Abdominal Aortic Aneurysm, Lipectomy Of Other Area, Esophagogastroduodenoscopy, Tonsillectomy, Partial Gastrectomy, Hernia Repair, Gastrointestinal Surgery, Coronary Artery Bypass Graft (CABG)   NON-GU PSH: Coronary Artery Bypass Grafting (cabg) - 2011 Excise  Excessive Skin Tissue - 04/16/2014 Remove Stomach; Partial - 04/16/2014 Remove Tonsils - 04/16/2014 Upper GI Endoscopy - 04/16/2014    GU PMH: BPH w/LUTS, Benign prostatic hypertrophy (BPH) with incomplete bladder emptying - 09/08/2015 Nocturia, Nocturia - 09/08/2015 Obstructive and reflux uropathy, Unspec, Obstructive uropathy - 09/08/2015 Retention Of Urine Ot, Acute urinary retention - 08/05/2015 Elevated PSA, Elevated prostate specific antigen (PSA) - 02/11/2015 Urinary Retention, Unspec, Urinary retention - 02/11/2015 ED, arterial insufficiency, Erectile dysfunction due to arterial insufficiency - 2014 Personal Hx urinary calculi, Nephrolithiasis - 2014    NON-GU PMH: Encounter for general adult medical examination without abnormal findings, Encounter for preventive health examination - 09/28/2014 Edema, unspecified, Peripheral edema - 04/16/2014 Old myocardial infarction, History of acute myocardial infarction - 04/16/2014 Personal history of other diseases of the circulatory system, History of congestive heart disease - 04/16/2014, History of abdominal aortic aneurysm, - 04/16/2014, History of cardiac disorder, - 04/16/2014, History of hypertension, - 2014 Personal history of other diseases of the respiratory system, History of chronic obstructive lung disease - 04/16/2014 Personal history of other infectious and parasitic diseases, History of herpes zoster - 04/16/2014 Personal history of other mental and behavioral disorders, History of depression - 04/16/2014 Personal history of other specified conditions, History of headache - 04/16/2014 Anxiety disorder, unspecified, Anxiety (Symptom) - 2014 Personal history of other endocrine, nutritional and metabolic disease, History of hypercholesterolemia - 2014 , Neoplasm Of The Prostate Gland - 2012, Acute Myocardial Infarction, - 2011,  Peptic Ulcer, - 2011    FAMILY HISTORY: Death In The Family Father - Father Death In The Family Mother -  Mother Diabetes - Mother Family Health Status Number Of Children - Runs In Family nephrolithiasis - Brother Prostate Cancer - Father   SOCIAL HISTORY: No Social History     Notes: Current every day smoker, Alcohol Use, Marital History - Currently Married, Tobacco Use, Caffeine Use, Retired From Work   REVIEW OF SYSTEMS:    GU Review Male:   Patient denies frequent urination, hard to postpone urination, burning/ pain with urination, get up at night to urinate, leakage of urine, stream starts and stops, trouble starting your stream, have to strain to urinate , erection problems, and penile pain.  Gastrointestinal (Upper):   Patient denies vomiting, indigestion/ heartburn, and nausea.  Gastrointestinal (Lower):   Patient denies diarrhea and constipation.  Constitutional:   Patient denies fever, night sweats, weight loss, and fatigue.  Skin:   Patient denies skin rash/ lesion and itching.  Eyes:   Patient denies blurred vision and double vision.  Ears/ Nose/ Throat:   Patient denies sore throat and sinus problems.  Hematologic/Lymphatic:   Patient denies swollen glands and easy bruising.  Cardiovascular:   Patient denies leg swelling and chest pains.  Respiratory:   Patient reports shortness of breath. Patient denies cough.  Endocrine:   Patient denies excessive thirst.  Musculoskeletal:   Patient denies back pain and joint pain.  Neurological:   Patient denies headaches and dizziness.  Psychologic:   Patient denies depression and anxiety.   VITAL SIGNS:    BP: 110/60 mmHg   Pulse: 54 /min   Temp: 97.5 F / 36 C      MULTI-SYSTEM PHYSICAL EXAMINATION:    Constitutional: WD, cachectic WM in NAD   Respiratory: BBS, CTA but distant  Cardiovascular: RRR without murmur.    PAST DATA REVIEWED:  Source Of History:  Patient   PROCEDURES:          Catheter / SP Tube - [id number] Simple Foley Indwelling Cath Change  The patient's indwelling foley was carefully removed. A 16 French Foley  catheter was inserted into the bladder using sterile technique. The patient was taught routine catheter care. A urine culture was sent to the lab. A leg bag was connected.   ASSESSMENT:      ICD-10 Details  1 GU:   BPH w/LUTS - N40.1 Stable  2   Retention Of Urine Ot - R33.8 Stable - He failed his voiding trial on dual medical therapy.    PLAN:           Orders Labs Urine Culture and Sensitivity          Schedule Return Visit: ASAP - Schedule Surgery  Return Notes: I discussed the options for treatment of his retention and I think he needs a TURP and will need clearance from [facility].

## 2015-11-24 ENCOUNTER — Ambulatory Visit (HOSPITAL_COMMUNITY): Payer: 59 | Admitting: Anesthesiology

## 2015-11-24 ENCOUNTER — Encounter (HOSPITAL_COMMUNITY)
Admission: RE | Disposition: A | Discharge status: Home / Self Care | Payer: Self-pay | Source: Ambulatory Visit | Attending: Urology

## 2015-11-24 ENCOUNTER — Observation Stay (HOSPITAL_COMMUNITY)
Admission: RE | Admit: 2015-11-24 | Discharge: 2015-11-25 | Disposition: A | Discharge status: Home / Self Care | Payer: 59 | Source: Ambulatory Visit | Attending: Urology | Admitting: Urology

## 2015-11-24 ENCOUNTER — Encounter (HOSPITAL_COMMUNITY): Payer: Self-pay | Admitting: *Deleted

## 2015-11-24 ENCOUNTER — Telehealth: Payer: Self-pay | Admitting: *Deleted

## 2015-11-24 DIAGNOSIS — J449 Chronic obstructive pulmonary disease, unspecified: Secondary | ICD-10-CM | POA: Diagnosis not present

## 2015-11-24 DIAGNOSIS — Z79899 Other long term (current) drug therapy: Secondary | ICD-10-CM | POA: Insufficient documentation

## 2015-11-24 DIAGNOSIS — Z9981 Dependence on supplemental oxygen: Secondary | ICD-10-CM | POA: Diagnosis not present

## 2015-11-24 DIAGNOSIS — I2510 Atherosclerotic heart disease of native coronary artery without angina pectoris: Secondary | ICD-10-CM | POA: Diagnosis not present

## 2015-11-24 DIAGNOSIS — F419 Anxiety disorder, unspecified: Secondary | ICD-10-CM | POA: Insufficient documentation

## 2015-11-24 DIAGNOSIS — F17200 Nicotine dependence, unspecified, uncomplicated: Secondary | ICD-10-CM | POA: Diagnosis not present

## 2015-11-24 DIAGNOSIS — Z7951 Long term (current) use of inhaled steroids: Secondary | ICD-10-CM | POA: Insufficient documentation

## 2015-11-24 DIAGNOSIS — Z79891 Long term (current) use of opiate analgesic: Secondary | ICD-10-CM | POA: Diagnosis not present

## 2015-11-24 DIAGNOSIS — I509 Heart failure, unspecified: Secondary | ICD-10-CM | POA: Diagnosis not present

## 2015-11-24 DIAGNOSIS — I252 Old myocardial infarction: Secondary | ICD-10-CM | POA: Diagnosis not present

## 2015-11-24 DIAGNOSIS — E785 Hyperlipidemia, unspecified: Secondary | ICD-10-CM | POA: Insufficient documentation

## 2015-11-24 DIAGNOSIS — R338 Other retention of urine: Secondary | ICD-10-CM | POA: Diagnosis not present

## 2015-11-24 DIAGNOSIS — E7800 Pure hypercholesterolemia, unspecified: Secondary | ICD-10-CM | POA: Diagnosis not present

## 2015-11-24 DIAGNOSIS — N401 Enlarged prostate with lower urinary tract symptoms: Principal | ICD-10-CM | POA: Diagnosis present

## 2015-11-24 DIAGNOSIS — I110 Hypertensive heart disease with heart failure: Secondary | ICD-10-CM | POA: Diagnosis not present

## 2015-11-24 DIAGNOSIS — Z951 Presence of aortocoronary bypass graft: Secondary | ICD-10-CM | POA: Insufficient documentation

## 2015-11-24 DIAGNOSIS — Z7982 Long term (current) use of aspirin: Secondary | ICD-10-CM | POA: Diagnosis not present

## 2015-11-24 DIAGNOSIS — N320 Bladder-neck obstruction: Secondary | ICD-10-CM | POA: Diagnosis not present

## 2015-11-24 DIAGNOSIS — I739 Peripheral vascular disease, unspecified: Secondary | ICD-10-CM | POA: Insufficient documentation

## 2015-11-24 DIAGNOSIS — N138 Other obstructive and reflux uropathy: Secondary | ICD-10-CM | POA: Insufficient documentation

## 2015-11-24 DIAGNOSIS — I10 Essential (primary) hypertension: Secondary | ICD-10-CM | POA: Diagnosis not present

## 2015-11-24 DIAGNOSIS — F329 Major depressive disorder, single episode, unspecified: Secondary | ICD-10-CM | POA: Insufficient documentation

## 2015-11-24 HISTORY — PX: TRANSURETHRAL RESECTION OF PROSTATE: SHX73

## 2015-11-24 SURGERY — TURP (TRANSURETHRAL RESECTION OF PROSTATE)
Anesthesia: General

## 2015-11-24 MED ORDER — BISACODYL 10 MG RE SUPP: 10.0000 mg | Freq: Every day | RECTAL | Status: DC | PRN

## 2015-11-24 MED ORDER — POTASSIUM CHLORIDE CRYS ER 20 MEQ PO TBCR
20.0000 meq | EXTENDED_RELEASE_TABLET | Freq: Every morning | ORAL | Status: DC
  Administered 2015-11-24 – 2015-11-25 (×2): 20 meq via ORAL
  Filled 2015-11-24 (×2): qty 1

## 2015-11-24 MED ORDER — EPHEDRINE SULFATE 50 MG/ML IJ SOLN
INTRAMUSCULAR | Status: DC | PRN
  Administered 2015-11-24 (×4): 10 mg via INTRAVENOUS

## 2015-11-24 MED ORDER — HYDROCODONE-ACETAMINOPHEN 10-325 MG PO TABS: 1.0000 | ORAL_TABLET | Freq: Four times a day (QID) | ORAL | Status: DC | PRN

## 2015-11-24 MED ORDER — ALBUTEROL SULFATE (2.5 MG/3ML) 0.083% IN NEBU: 2.5000 mg | INHALATION_SOLUTION | RESPIRATORY_TRACT | Status: DC | PRN

## 2015-11-24 MED ORDER — HYDROCODONE-ACETAMINOPHEN 5-325 MG PO TABS: 1.0000 | ORAL_TABLET | ORAL | Status: DC | PRN

## 2015-11-24 MED ORDER — DEXAMETHASONE SODIUM PHOSPHATE 10 MG/ML IJ SOLN
INTRAMUSCULAR | Status: AC
  Filled 2015-11-24: qty 1

## 2015-11-24 MED ORDER — HYOSCYAMINE SULFATE 0.125 MG SL SUBL
0.1250 mg | SUBLINGUAL_TABLET | SUBLINGUAL | Status: DC | PRN
  Filled 2015-11-24: qty 1

## 2015-11-24 MED ORDER — NITROGLYCERIN 0.4 MG SL SUBL: 0.4000 mg | SUBLINGUAL_TABLET | SUBLINGUAL | Status: DC | PRN

## 2015-11-24 MED ORDER — NALOXONE HCL 0.4 MG/ML IJ SOLN
INTRAMUSCULAR | Status: DC | PRN
  Administered 2015-11-24: .08 ug via INTRAVENOUS

## 2015-11-24 MED ORDER — METOPROLOL SUCCINATE ER 25 MG PO TB24
12.5000 mg | ORAL_TABLET | Freq: Two times a day (BID) | ORAL | Status: DC
  Administered 2015-11-24 – 2015-11-25 (×2): 12.5 mg via ORAL
  Filled 2015-11-24 (×2): qty 1

## 2015-11-24 MED ORDER — ONDANSETRON HCL 4 MG/2ML IJ SOLN
INTRAMUSCULAR | Status: DC | PRN
Start: 2015-11-24 — End: 2015-11-24
  Administered 2015-11-24: 4 mg via INTRAVENOUS

## 2015-11-24 MED ORDER — FINASTERIDE 5 MG PO TABS
5.0000 mg | ORAL_TABLET | Freq: Every day | ORAL | Status: DC
  Administered 2015-11-24 – 2015-11-25 (×2): 5 mg via ORAL
  Filled 2015-11-24 (×2): qty 1

## 2015-11-24 MED ORDER — PROMETHAZINE HCL 25 MG/ML IJ SOLN: 6.2500 mg | INTRAMUSCULAR | Status: DC | PRN

## 2015-11-24 MED ORDER — CEFAZOLIN SODIUM-DEXTROSE 2-4 GM/100ML-% IV SOLN
INTRAVENOUS | Status: AC
  Filled 2015-11-24: qty 100

## 2015-11-24 MED ORDER — MOMETASONE FURO-FORMOTEROL FUM 200-5 MCG/ACT IN AERO
2.0000 | INHALATION_SPRAY | Freq: Two times a day (BID) | RESPIRATORY_TRACT | Status: DC
  Administered 2015-11-24 – 2015-11-25 (×2): 2 via RESPIRATORY_TRACT
  Filled 2015-11-24: qty 8.8

## 2015-11-24 MED ORDER — FENTANYL CITRATE (PF) 100 MCG/2ML IJ SOLN
INTRAMUSCULAR | Status: DC | PRN
  Administered 2015-11-24 (×2): 25 ug via INTRAVENOUS

## 2015-11-24 MED ORDER — FLEET ENEMA 7-19 GM/118ML RE ENEM: 1.0000 | ENEMA | Freq: Once | RECTAL | Status: DC | PRN

## 2015-11-24 MED ORDER — TIOTROPIUM BROMIDE MONOHYDRATE 18 MCG IN CAPS
18.0000 ug | ORAL_CAPSULE | Freq: Every day | RESPIRATORY_TRACT | Status: DC
  Administered 2015-11-25: 18 ug via RESPIRATORY_TRACT
  Filled 2015-11-24: qty 5

## 2015-11-24 MED ORDER — CEFAZOLIN SODIUM-DEXTROSE 2-4 GM/100ML-% IV SOLN
2.0000 g | INTRAVENOUS | Status: AC
  Administered 2015-11-24: 2 g via INTRAVENOUS

## 2015-11-24 MED ORDER — MAGNESIUM HYDROXIDE 400 MG/5ML PO SUSP: 30.0000 mL | Freq: Every day | ORAL | Status: DC | PRN

## 2015-11-24 MED ORDER — DIPHENHYDRAMINE HCL 50 MG/ML IJ SOLN: 12.5000 mg | Freq: Four times a day (QID) | INTRAMUSCULAR | Status: DC | PRN

## 2015-11-24 MED ORDER — ALBUTEROL SULFATE HFA 108 (90 BASE) MCG/ACT IN AERS: 2.0000 | INHALATION_SPRAY | RESPIRATORY_TRACT | Status: DC | PRN

## 2015-11-24 MED ORDER — TAMSULOSIN HCL 0.4 MG PO CAPS
0.8000 mg | ORAL_CAPSULE | Freq: Every day | ORAL | Status: DC
  Administered 2015-11-24: 0.8 mg via ORAL
  Filled 2015-11-24 (×2): qty 2

## 2015-11-24 MED ORDER — ACETAMINOPHEN 325 MG PO TABS: 650.0000 mg | ORAL_TABLET | ORAL | Status: DC | PRN

## 2015-11-24 MED ORDER — FENTANYL CITRATE (PF) 100 MCG/2ML IJ SOLN: 25.0000 ug | INTRAMUSCULAR | Status: DC | PRN

## 2015-11-24 MED ORDER — LIDOCAINE HCL (CARDIAC) 20 MG/ML IV SOLN
INTRAVENOUS | Status: AC
  Filled 2015-11-24: qty 5

## 2015-11-24 MED ORDER — CEPHALEXIN 500 MG PO CAPS
500.0000 mg | ORAL_CAPSULE | Freq: Three times a day (TID) | ORAL | Status: DC
  Administered 2015-11-24 – 2015-11-25 (×3): 500 mg via ORAL
  Filled 2015-11-24 (×3): qty 1

## 2015-11-24 MED ORDER — LIDOCAINE HCL (CARDIAC) 20 MG/ML IV SOLN
INTRAVENOUS | Status: DC | PRN
  Administered 2015-11-24: 100 mg via INTRAVENOUS

## 2015-11-24 MED ORDER — ONDANSETRON HCL 4 MG/2ML IJ SOLN
4.0000 mg | INTRAMUSCULAR | Status: DC | PRN
Start: 2015-11-24 — End: 2015-11-25

## 2015-11-24 MED ORDER — CLONAZEPAM 0.5 MG PO TABS: 0.5000 mg | ORAL_TABLET | Freq: Two times a day (BID) | ORAL | Status: DC | PRN

## 2015-11-24 MED ORDER — LACTATED RINGERS IV SOLN
INTRAVENOUS | Status: DC
  Administered 2015-11-24: 1000 mL via INTRAVENOUS
  Administered 2015-11-24: 13:00:00 via INTRAVENOUS

## 2015-11-24 MED ORDER — LACTATED RINGERS IV SOLN: INTRAVENOUS | Status: DC

## 2015-11-24 MED ORDER — ZOLPIDEM TARTRATE 5 MG PO TABS: 5.0000 mg | ORAL_TABLET | Freq: Every evening | ORAL | Status: DC | PRN

## 2015-11-24 MED ORDER — ATORVASTATIN CALCIUM 40 MG PO TABS
80.0000 mg | ORAL_TABLET | Freq: Every day | ORAL | Status: DC
  Administered 2015-11-25: 80 mg via ORAL
  Filled 2015-11-24: qty 2

## 2015-11-24 MED ORDER — SODIUM CHLORIDE 0.9 % IR SOLN
Status: DC | PRN
  Administered 2015-11-24: 12000 mL

## 2015-11-24 MED ORDER — FENTANYL CITRATE (PF) 100 MCG/2ML IJ SOLN
INTRAMUSCULAR | Status: AC
  Filled 2015-11-24: qty 2

## 2015-11-24 MED ORDER — ONDANSETRON HCL 4 MG/2ML IJ SOLN
INTRAMUSCULAR | Status: AC
  Filled 2015-11-24: qty 2

## 2015-11-24 MED ORDER — BENAZEPRIL HCL 10 MG PO TABS
5.0000 mg | ORAL_TABLET | Freq: Every day | ORAL | Status: DC
Start: 2015-11-25 — End: 2015-11-25
  Administered 2015-11-25: 5 mg via ORAL
  Filled 2015-11-24: qty 1

## 2015-11-24 MED ORDER — MEPERIDINE HCL 50 MG/ML IJ SOLN: 6.2500 mg | INTRAMUSCULAR | Status: DC | PRN

## 2015-11-24 MED ORDER — HYDROMORPHONE HCL 1 MG/ML IJ SOLN
0.5000 mg | INTRAMUSCULAR | Status: DC | PRN
  Administered 2015-11-24: 1 mg via INTRAVENOUS
  Filled 2015-11-24: qty 1

## 2015-11-24 MED ORDER — DIPHENHYDRAMINE HCL 12.5 MG/5ML PO ELIX: 12.5000 mg | ORAL_SOLUTION | Freq: Four times a day (QID) | ORAL | Status: DC | PRN

## 2015-11-24 MED ORDER — DEXAMETHASONE SODIUM PHOSPHATE 10 MG/ML IJ SOLN
INTRAMUSCULAR | Status: DC | PRN
  Administered 2015-11-24: 10 mg via INTRAVENOUS

## 2015-11-24 MED ORDER — PROPOFOL 10 MG/ML IV BOLUS
INTRAVENOUS | Status: AC
  Filled 2015-11-24: qty 20

## 2015-11-24 MED ORDER — KCL IN DEXTROSE-NACL 20-5-0.45 MEQ/L-%-% IV SOLN
INTRAVENOUS | Status: DC
  Administered 2015-11-24 – 2015-11-25 (×2): via INTRAVENOUS
  Filled 2015-11-24 (×2): qty 1000

## 2015-11-24 MED ORDER — TORSEMIDE 20 MG PO TABS
40.0000 mg | ORAL_TABLET | Freq: Every day | ORAL | Status: DC
  Administered 2015-11-25: 40 mg via ORAL
  Filled 2015-11-24: qty 2

## 2015-11-24 MED ORDER — CITALOPRAM HYDROBROMIDE 20 MG PO TABS
20.0000 mg | ORAL_TABLET | Freq: Every day | ORAL | Status: DC
  Administered 2015-11-25: 20 mg via ORAL
  Filled 2015-11-24: qty 1

## 2015-11-24 MED ORDER — PROPOFOL 10 MG/ML IV BOLUS
INTRAVENOUS | Status: DC | PRN
  Administered 2015-11-24: 100 mg via INTRAVENOUS

## 2015-11-24 SURGICAL SUPPLY — 16 items
BAG URINE DRAINAGE (UROLOGICAL SUPPLIES) ×2 IMPLANT
BAG URO CATCHER STRL LF (MISCELLANEOUS) ×3 IMPLANT
CATH FOLEY 3WAY 30CC 22FR (CATHETERS) IMPLANT
ELECT REM PT RETURN 9FT ADLT (ELECTROSURGICAL) ×3
ELECTRODE REM PT RTRN 9FT ADLT (ELECTROSURGICAL) ×1 IMPLANT
GLOVE SURG SS PI 8.0 STRL IVOR (GLOVE) ×2 IMPLANT
GOWN STRL REUS W/TWL XL LVL3 (GOWN DISPOSABLE) ×3 IMPLANT
HOLDER FOLEY CATH W/STRAP (MISCELLANEOUS) ×2 IMPLANT
LOOP CUT BIPOLAR 24F LRG (ELECTROSURGICAL) ×2 IMPLANT
MANIFOLD NEPTUNE II (INSTRUMENTS) ×3 IMPLANT
PACK CYSTO (CUSTOM PROCEDURE TRAY) ×3 IMPLANT
SET ASPIRATION TUBING (TUBING) ×3 IMPLANT
SYR 30ML LL (SYRINGE) ×2 IMPLANT
SYRINGE IRR TOOMEY STRL 70CC (SYRINGE) ×2 IMPLANT
TUBING CONNECTING 10 (TUBING) ×2 IMPLANT
TUBING CONNECTING 10' (TUBING) ×1

## 2015-11-24 NOTE — Anesthesia Preprocedure Evaluation (Addendum)
Anesthesia Evaluation  Patient identified by MRN, date of birth, ID band Patient awake    Reviewed: Allergy & Precautions, NPO status , Patient's Chart, lab work & pertinent test results, reviewed documented beta blocker date and time   Airway Mallampati: I  TM Distance: >3 FB Neck ROM: Full    Dental  (+) Edentulous Upper, Edentulous Lower   Pulmonary COPD,  COPD inhaler and oxygen dependent, former smoker,     + decreased breath sounds      Cardiovascular hypertension, Pt. on medications and Pt. on home beta blockers + CAD, + Past MI, + CABG, + Peripheral Vascular Disease and +CHF   Rhythm:Regular Rate:Normal     Neuro/Psych PSYCHIATRIC DISORDERS Anxiety Depression negative neurological ROS     GI/Hepatic Neg liver ROS, PUD,   Endo/Other  negative endocrine ROS  Renal/GU negative Renal ROS  negative genitourinary   Musculoskeletal negative musculoskeletal ROS (+)   Abdominal   Peds negative pediatric ROS (+)  Hematology negative hematology ROS (+)   Anesthesia Other Findings   Reproductive/Obstetrics negative OB ROS                           Lab Results  Component Value Date   WBC 8.0 11/21/2015   HGB 12.6* 11/21/2015   HCT 39.6 11/21/2015   MCV 95.2 11/21/2015   PLT 146* 11/21/2015   Lab Results  Component Value Date   CREATININE 0.55* 11/21/2015   BUN 14 11/21/2015   NA 138 11/21/2015   K 3.9 11/21/2015   CL 97* 11/21/2015   CO2 37* 11/21/2015   Lab Results  Component Value Date   INR 1.17 09/14/2014   INR 1.07 09/10/2014   INR 1.02 01/19/2014   10/2015 EKG: sinus bradycardia, 1st degree AV block.  04/2015 Echo - Left ventricle: LVEF is approximately 20 to 25% with severe hypokinesis of the lateral wall; akinenesis of the distal lateral, distal septal, apical, mid/distal inferior , distal anteior walls False tendon seen in LV. The cavity size was normal. Wall  thickness was normal. Doppler parameters are consistent with abnormal left ventricular relaxation (grade 1 diastolic dysfunction). - Mitral valve: Calcified annulus. Mildly thickened leaflets . - Left atrium: The atrium was moderately to severely dilated. - Right ventricle: The cavity size was mildly dilated. Systolic function was moderately reduced. - Right atrium: The atrium was moderately dilated.  Anesthesia Physical Anesthesia Plan  ASA: III  Anesthesia Plan: General   Post-op Pain Management:    Induction: Intravenous  Airway Management Planned: LMA  Additional Equipment:   Intra-op Plan:   Post-operative Plan: Possible Post-op intubation/ventilation  Informed Consent: I have reviewed the patients History and Physical, chart, labs and discussed the procedure including the risks, benefits and alternatives for the proposed anesthesia with the patient or authorized representative who has indicated his/her understanding and acceptance.   Dental advisory given  Plan Discussed with: CRNA  Anesthesia Plan Comments: (Possible ETT if LMA does not seat.  Anesthetic plan discussed in detail. Associated risk discussed including but not limited to life threatening cardiovascular, pulmonary events and dental damage. The postoperative pain management and antiemetic plan discussed with patient. All questions answered in detail. Patient is in agreement.   )      Anesthesia Quick Evaluation

## 2015-11-24 NOTE — Discharge Instructions (Signed)
Transurethral Resection of the Prostate, Care After °Refer to this sheet in the next few weeks. These instructions provide you with information on caring for yourself after your procedure. Your caregiver also may give you specific instructions. Your treatment has been planned according to current medical practices, but complications sometimes occur. Call your caregiver if you have any problems or questions after your procedure. °HOME CARE INSTRUCTIONS  °Recovery can take 4-6 weeks. Avoid alcohol, caffeinated drinks, and spicy foods for 2 weeks after your procedure. Drink enough fluids to keep your urine clear or pale yellow. Urinate as soon as you feel the urge to do so. Do not try to hold your urine for long periods of time. °During recovery you may experience pain caused by bladder spasms, which result in a very intense urge to urinate. Take all medicines as directed by your caregiver, including medicines for pain. Try to limit the amount of pain medicines you take because it can cause constipation. If you do become constipated, do not strain to move your bowels. Straining can increase bleeding. Constipation can be minimized by increasing the amount fluids and fiber in your diet. Your caregiver also may prescribe a stool softener. °Do not lift heavy objects (more than 5 lb [2.25 kg]) or perform exercises that cause you to strain for at least 1 month after your procedure. When sitting, you may want to sit in a soft chair or use a cushion. For the first 10 days after your procedure, avoid the following activities: °· Running. °· Strenuous work. °· Long walks. °· Riding in a car for extended periods. °· Sex. °SEEK MEDICAL CARE IF: °· You have difficulty urinating. °· You have blood in your urine that does not go away after you rest or increase your fluid intake. °· You have swelling in your penis or scrotum. °SEEK IMMEDIATE MEDICAL CARE IF:  °· You are suddenly unable to urinate. °· You notice blood clots in your  urine. °· You have chills. °· You have a fever. °· You have pain in your back or lower abdomen. °· You have pain or swelling in your legs. °MAKE SURE YOU:  °· Understand these instructions. °· Will watch your condition. °· Will get help right away if you are not doing well or get worse. °  °This information is not intended to replace advice given to you by your health care provider. Make sure you discuss any questions you have with your health care provider. °  °Document Released: 04/23/2005 Document Revised: 05/14/2014 Document Reviewed: 06/01/2011 °Elsevier Interactive Patient Education ©2016 [employer] ° °

## 2015-11-24 NOTE — Transfer of Care (Signed)
Immediate Anesthesia Transfer of Care Note  Patient: Tommy Turner  Procedure(s) Performed: Procedure(s): TRANSURETHRAL RESECTION OF THE PROSTATE (TURP) (N/A)  Patient Location: PACU  Anesthesia Type:General  Level of Consciousness: sedated  Airway & Oxygen Therapy: Patient Spontanous Breathing and Patient connected to face mask oxygen  Post-op Assessment: Report given to RN and Post -op Vital signs reviewed and stable  Post vital signs: Reviewed and stable  Last Vitals:  Filed Vitals:   11/24/15 0913  BP: 97/55  Pulse: 67  Temp: 36.5 C  Resp: 20    Last Pain: There were no vitals filed for this visit.    Patients Stated Pain Goal: 4 ([id number])  Complications: No apparent anesthesia complications

## 2015-11-24 NOTE — Anesthesia Postprocedure Evaluation (Signed)
Anesthesia Post Note  Patient: Tommy Turner  Procedure(s) Performed: Procedure(s) (LRB): TRANSURETHRAL RESECTION OF THE PROSTATE (TURP) (N/A)  Patient location during evaluation: PACU Anesthesia Type: General Level of consciousness: awake and alert Pain management: pain level controlled Vital Signs Assessment: post-procedure vital signs reviewed and stable Respiratory status: spontaneous breathing, nonlabored ventilation, respiratory function stable and patient connected to nasal cannula oxygen Cardiovascular status: blood pressure returned to baseline and stable Postop Assessment: no signs of nausea or vomiting Anesthetic complications: no    Last Vitals:  Filed Vitals:   11/24/15 1345 11/24/15 1407  BP: 117/70 111/48  Pulse: 73 74  Temp: 36.4 C 36.4 C  Resp: 17 16    Last Pain:  Filed Vitals:   11/24/15 1421  PainSc: 0-No pain                 Effie Berkshire

## 2015-11-24 NOTE — Interval H&P Note (Signed)
History and Physical Interval Note:  11/24/2015 11:36 AM  Tommy Turner  has presented today for surgery, with the diagnosis of BENIGN PROSTATIC HYPERTROPHY WITH RETENTION  The various methods of treatment have been discussed with the patient and family. After consideration of risks, benefits and other options for treatment, the patient has consented to  Procedure(s): TRANSURETHRAL RESECTION OF THE PROSTATE (TURP) (N/A) as a surgical intervention .  The patient's history has been reviewed, patient examined, no change in status, stable for surgery.  I have reviewed the patient's chart and labs.  Questions were answered to the patient's satisfaction.     Grover Robinson J

## 2015-11-24 NOTE — Brief Op Note (Signed)
11/24/2015  12:33 PM  PATIENT:  Tommy Turner  75 y.o. male  PRE-OPERATIVE DIAGNOSIS:  BENIGN PROSTATIC HYPERTROPHY WITH RETENTION  POST-OPERATIVE DIAGNOSIS:  BENIGN PROSTATIC HYPERTROPHY WITH RETENTION  PROCEDURE:  Procedure(s): TRANSURETHRAL RESECTION OF THE PROSTATE (TURP) (N/A)  SURGEON:  Surgeon(s) and Role:    * Irine Seal, MD - Primary  PHYSICIAN ASSISTANT:   ASSISTANTS: none   ANESTHESIA:   general  EBL:  Total I/O In: 800 [I.V.:800] Out: -   BLOOD ADMINISTERED:none  DRAINS: Urinary Catheter (Foley)   LOCAL MEDICATIONS USED:  NONE  SPECIMEN:  Source of Specimen:  prostate chips  DISPOSITION OF SPECIMEN:  PATHOLOGY  COUNTS:  YES  TOURNIQUET:  * No tourniquets in log *  DICTATION: .Other Dictation: Dictation Number [id number]  PLAN OF CARE: Discharge to home after PACU  PATIENT DISPOSITION:  PACU - hemodynamically stable.   Delay start of Pharmacological VTE agent (>24hrs) due to surgical blood loss or risk of bleeding: not applicable

## 2015-11-24 NOTE — Telephone Encounter (Signed)
Received this message regarding low bp's in preop, surgery cancelled. Can have patient decrease his benazepril to 2.5mg  daily and come in for nursing bp check later this week, either Thurs or Friday  Pt aware to decrease benazepril 2.5 mg - pt is having surgery today

## 2015-11-24 NOTE — Progress Notes (Signed)
Patient ID: Tommy Turner, male   DOB: [birth year], 75 y.o.   MRN: [id number] Doing well post TURP.  Urine clear.  BP 111/48 mmHg  Pulse 74  Temp(Src) 97.5 F (36.4 C) (Oral)  Resp 16  Ht 5\' 7"  (1.702 m)  Wt 59.875 kg (132 lb)  BMI 20.67 kg/m2  SpO2 95%   Foley out in am with discharge when voiding.

## 2015-11-25 ENCOUNTER — Encounter (HOSPITAL_COMMUNITY): Payer: Self-pay | Admitting: Urology

## 2015-11-25 DIAGNOSIS — Z951 Presence of aortocoronary bypass graft: Secondary | ICD-10-CM | POA: Diagnosis not present

## 2015-11-25 DIAGNOSIS — Z7982 Long term (current) use of aspirin: Secondary | ICD-10-CM | POA: Diagnosis not present

## 2015-11-25 DIAGNOSIS — I110 Hypertensive heart disease with heart failure: Secondary | ICD-10-CM | POA: Diagnosis not present

## 2015-11-25 DIAGNOSIS — R338 Other retention of urine: Secondary | ICD-10-CM | POA: Diagnosis not present

## 2015-11-25 DIAGNOSIS — I252 Old myocardial infarction: Secondary | ICD-10-CM | POA: Diagnosis not present

## 2015-11-25 DIAGNOSIS — Z79899 Other long term (current) drug therapy: Secondary | ICD-10-CM | POA: Diagnosis not present

## 2015-11-25 DIAGNOSIS — N401 Enlarged prostate with lower urinary tract symptoms: Secondary | ICD-10-CM | POA: Diagnosis not present

## 2015-11-25 DIAGNOSIS — Z7951 Long term (current) use of inhaled steroids: Secondary | ICD-10-CM | POA: Diagnosis not present

## 2015-11-25 DIAGNOSIS — N138 Other obstructive and reflux uropathy: Secondary | ICD-10-CM | POA: Diagnosis not present

## 2015-11-25 MED ORDER — CEPHALEXIN 500 MG PO CAPS: 500.0000 mg | ORAL_CAPSULE | Freq: Three times a day (TID) | ORAL | Status: DC

## 2015-11-25 NOTE — Care Management Note (Signed)
Case Management Note  Patient Details  Name: Tommy Turner MRN: [id number] Date of Birth: 16-Nov-1940  Subjective/Objective:  75 y/o m admitted w/BPH, POD#1 TURP. From home.                   Action/Plan:d/c home no needs or orders.   Expected Discharge Date:                Expected Discharge Plan:  Home/Self Care  In-House Referral:     Discharge planning Services  CM Consult  Post Acute Care Choice:    Choice offered to:     DME Arranged:    DME Agency:     HH Arranged:    [place] Agency:     Status of Service:  Completed, signed off  If discussed at [employer] of Stay Meetings, dates discussed:    Additional Comments:  Dessa Phi, RN 11/25/2015, 9:31 AM

## 2015-11-25 NOTE — Op Note (Signed)
NAMESEYMOUR, BENNETTS               ACCOUNT NO.:  [id number]  MEDICAL RECORD NO.:  [id number]  LOCATION:  [id number]                         FACILITY:  [facility]  PHYSICIAN:  Marshall Cork. Jeffie Pollock, M.D.    DATE OF BIRTH:  [birth year]  DATE OF PROCEDURE:  11/24/2015 DATE OF DISCHARGE:                              OPERATIVE REPORT   PROCEDURES:  Transurethral resection of the prostate.  Removal of bladder stones.  PREOPERATIVE DIAGNOSES:  Benign prostatic hypertrophy and bladder outlet obstruction.  POSTOPERATIVE DIAGNOSES:  Benign prostatic hypertrophy and bladder outlet obstruction.  SURGEON:  Marshall Cork. Jeffie Pollock, M.D.  ANESTHESIA:  General.  SPECIMEN:  Prostate chips.  DRAINS:  A 22-French, 3-way Foley catheter.  BLOOD LOSS:  Minimal.  SPECIMEN:  Prostate chips.  COMPLICATIONS:  None.  INDICATIONS:  Mr. Klutz is a 75 year old white male with BPH and urinary retention.  He has elected to undergo TURP.  FINDINGS AND PROCEDURE:  He was taken to the operating room where general anesthetic was induced and he was given Ancef.  He was placed in lithotomy position.  His Foley catheter was removed.  He was fitted with PAS hose.  Perineum and genitalia were prepped with Betadine solution and was draped in usual sterile fashion.  Cystoscopy was performed using the 23-French scope and 30-degree lens. Examination revealed the normal urethra.  The external sphincter was intact.  The prostate was approximately 3 cm in length with bilobar hyperplasia with coaptation and obstruction.  Examination of the bladder revealed moderate severe trabeculation.  There was catheter edema on the posterior wall.  There were some eggshell calcifications from his chronic catheter.  Ureteral orifices were unremarkable.  No tumors were noted.  The stone fragments were evacuated from the bladder.  The cystoscope was removed and the 28-French continuous flow resectoscope sheath was placed with aid of a visual obturator.   This was fitted with an Beatrix Fetters handle with bipolar loop and 30-degree lens with saline as the irrigant.  The resection was initiated at the bladder neck by exposing the bladder neck fibers from 5-7 o'clock.  The floor of the prostate was then resected out to alongside of the verumontanum.  The left lobe of the prostate was resected from bladder neck to apex out to capsular fibers.  This was repeated on the right side.  The chips were evacuated.  At this point, additional anterior and apical tissue was resected and the chips were retrieved.  Hemostasis was achieved, final inspection revealed no retained chips, intact ureteral orifices and intact external sphincter and no active bleeding.  Pressure on the bladder after removal of the scope produced a good stream.  A 22- [place] 3-way Foley catheter was placed with aid of a catheter guide. The balloon was filled with 30 mL of sterile fluid.  The catheter was irrigated with clear return.  The catheter was placed to straight drainage and continuous irrigation.  The patient was taken out of lithotomy position.  He was moved to the recovery room in stable condition.  There were no complications.     Marshall Cork. Jeffie Pollock, M.D.     JJW/MEDQ  D:  11/24/2015  T:  11/25/2015  Job:  [id number] 

## 2015-11-25 NOTE — Discharge Summary (Signed)
Physician Discharge Summary  Patient ID: Tommy Turner MRN: [id number] DOB/AGE: 1940/11/05 75 y.o.  Admit date: 11/24/2015 Discharge date: 11/25/2015  Admission Diagnoses:  BPH (benign prostatic hypertrophy) with urinary retention  Discharge Diagnoses:  Principal Problem:   BPH (benign prostatic hypertrophy) with urinary retention   Past Medical History  Diagnosis Date  . AAA (abdominal aortic aneurysm) ([place])   . Hyperlipidemia     takes Atorvastatin daily  . CAD (coronary artery disease)   . Ulcer     gastric  . Aneurysm ([place])   . Anxiety     takes Xanax daily as needed  . Depression     takes Celexa daily  . Hypertension     takes Metoprolol and Benazepril  daily  . Peripheral edema     takes Furosemide daily  . Myocardial infarction ([place])     20+yrs ago  . COPD (chronic obstructive pulmonary disease) ([facility])     Albuterol inhaler and neb daily as needed;takes SPiriva daily  . History of shingles   . CHF (congestive heart failure) ([place])   . Shortness of breath      on oxygen  2 liters continuous  . Foley catheter in place   . Oxygen dependent     2L/[place]     Surgeries: Procedure(s): TRANSURETHRAL RESECTION OF THE PROSTATE (TURP) on 11/24/2015   Consultants (if any):    Discharged Condition: Improved  Hospital Course: Tommy Turner is an 75 y.o. male who was admitted 11/24/2015 with a diagnosis of BPH (benign prostatic hypertrophy) with urinary retention and went to the operating room on 11/24/2015 and underwent the above named procedures.  His foley was removed this morning and he was able to void without difficulty.   He will be discharged home but I will keep him on Keflex for a few more days because of the bladder inflammation related to his chronic foley catheter.   He was given perioperative antibiotics:  Anti-infectives    Start     Dose/Rate Route Frequency Ordered Stop   11/25/15 0000  cephALEXin (KEFLEX) 500 MG capsule     500 mg Oral 3 times daily  11/25/15 0927     11/24/15 1600  cephALEXin (KEFLEX) capsule 500 mg     500 mg Oral 3 times daily 11/24/15 1416     11/24/15 0936  ceFAZolin (ANCEF) IVPB 2g/100 mL premix     2 g 200 mL/hr over 30 Minutes Intravenous 30 min pre-op 11/24/15 0936 11/24/15 1145    .  He was given sequential compression devicesfor DVT prophylaxis.  He benefited maximally from the hospital stay and there were no complications.    Recent vital signs:  Filed Vitals:   11/25/15 0523 11/25/15 0750  BP: 91/48 100/48  Pulse: 65 62  Temp: 98.2 F (36.8 C)   Resp: 18     Recent laboratory studies:  Lab Results  Component Value Date   HGB 12.6* 11/21/2015   HGB 10.6* 05/29/2015   HGB 11.6* 05/27/2015   Lab Results  Component Value Date   WBC 8.0 11/21/2015   PLT 146* 11/21/2015   Lab Results  Component Value Date   INR 1.17 09/14/2014   Lab Results  Component Value Date   NA 138 11/21/2015   K 3.9 11/21/2015   CL 97* 11/21/2015   CO2 37* 11/21/2015   BUN 14 11/21/2015   CREATININE 0.55* 11/21/2015   GLUCOSE 107* 11/21/2015    Discharge Medications:  Medication List    TAKE these medications        albuterol (2.5 MG/3ML) 0.083% nebulizer solution  Commonly known as:  PROVENTIL  USE 1 VIAL IN NEBULIZER EVERY 4 HOURS AS NEEDED     VENTOLIN HFA 108 (90 Base) MCG/ACT inhaler  Generic drug:  albuterol  INHALE 2 PUFFS BY MOUTH EVERY 4-6 HOURS AS NEEDED     aspirin EC 81 MG tablet  Take 81 mg by mouth daily with breakfast.     atorvastatin 80 MG tablet  Commonly known as:  LIPITOR  Take 1 tablet (80 mg total) by mouth daily.     benazepril 5 MG tablet  Commonly known as:  LOTENSIN  Take 1 tablet (5 mg total) by mouth daily.     budesonide-formoterol 160-4.5 MCG/ACT inhaler  Commonly known as:  SYMBICORT  Inhale 2 puffs into the lungs 2 (two) times daily.     cephALEXin 500 MG capsule  Commonly known as:  KEFLEX  Take 1 capsule (500 mg total) by mouth 3 (three) times  daily.     citalopram 20 MG tablet  Commonly known as:  CELEXA  1 qd     clonazePAM 0.5 MG tablet  Commonly known as:  KLONOPIN  Take 1 tablet (0.5 mg total) by mouth 2 (two) times daily as needed for anxiety.     finasteride 5 MG tablet  Commonly known as:  PROSCAR  Take 5 mg by mouth daily.     HYDROcodone-acetaminophen 10-325 MG tablet  Commonly known as:  NORCO  Take 1 tablet by mouth every 6 (six) hours as needed for severe pain.     metoprolol succinate 25 MG 24 hr tablet  Commonly known as:  TOPROL-XL  Take 0.5 tablets (12.5 mg total) by mouth 2 (two) times daily.     nitroGLYCERIN 0.4 MG SL tablet  Commonly known as:  NITROSTAT  Place 1 tablet (0.4 mg total) under the tongue every 5 (five) minutes as needed for chest pain.     OXYGEN  Inhale 2 L into the lungs continuous.     potassium chloride SA 20 MEQ tablet  Commonly known as:  K-DUR,KLOR-CON  TAKE 1 TABLET BY MOUTH DAILY.     tamsulosin 0.4 MG Caps capsule  Commonly known as:  FLOMAX  Take 2 capsules (0.8 mg total) by mouth at bedtime.     tiotropium 18 MCG inhalation capsule  Commonly known as:  SPIRIVA HANDIHALER  PLACE 1 CAPSULE INTO INHALER AND INHALE DAILY     torsemide 20 MG tablet  Commonly known as:  DEMADEX  Take 2 tablets (40 mg total) by mouth daily.        Diagnostic Studies: Dg Chest 2 View  11/21/2015  CLINICAL DATA:  Preop for TURP EXAM: CHEST  2 VIEW COMPARISON:  05/28/2015 FINDINGS: Cardiomegaly again noted. No acute infiltrate or pleural effusion. No pulmonary edema. Mild hyperinflation. Osteopenia and mild degenerative changes thoracic spine. Status post CABG IMPRESSION: No active disease.  Mild hyperinflation.  Status post CABG. Electronically Signed   By: Lahoma Crocker M.D.   On: 11/21/2015 14:29    Disposition: 01-Home or Self Care      Discharge Instructions    Discontinue IV    Complete by:  As directed            Follow-up Information    Follow up with Malka So, MD  On 12/16/2015.   Specialty:  Urology   Why:  [phone]  Contact information:   [address] [place] [address] [phone]        Signed: Malka So 11/25/2015, 9:28 AM

## 2015-11-27 ENCOUNTER — Encounter (HOSPITAL_COMMUNITY): Payer: Self-pay | Admitting: Emergency Medicine

## 2015-11-27 ENCOUNTER — Emergency Department (HOSPITAL_COMMUNITY)
Admission: EM | Admit: 2015-11-27 | Discharge: 2015-11-27 | Disposition: A | Discharge status: Home / Self Care | Payer: 59 | Attending: Emergency Medicine | Admitting: Emergency Medicine

## 2015-11-27 DIAGNOSIS — J449 Chronic obstructive pulmonary disease, unspecified: Secondary | ICD-10-CM | POA: Insufficient documentation

## 2015-11-27 DIAGNOSIS — I509 Heart failure, unspecified: Secondary | ICD-10-CM | POA: Insufficient documentation

## 2015-11-27 DIAGNOSIS — I110 Hypertensive heart disease with heart failure: Secondary | ICD-10-CM | POA: Insufficient documentation

## 2015-11-27 DIAGNOSIS — Z7951 Long term (current) use of inhaled steroids: Secondary | ICD-10-CM | POA: Diagnosis not present

## 2015-11-27 DIAGNOSIS — Z79891 Long term (current) use of opiate analgesic: Secondary | ICD-10-CM | POA: Diagnosis not present

## 2015-11-27 DIAGNOSIS — E785 Hyperlipidemia, unspecified: Secondary | ICD-10-CM | POA: Insufficient documentation

## 2015-11-27 DIAGNOSIS — F329 Major depressive disorder, single episode, unspecified: Secondary | ICD-10-CM | POA: Insufficient documentation

## 2015-11-27 DIAGNOSIS — I252 Old myocardial infarction: Secondary | ICD-10-CM | POA: Insufficient documentation

## 2015-11-27 DIAGNOSIS — Z87891 Personal history of nicotine dependence: Secondary | ICD-10-CM | POA: Diagnosis not present

## 2015-11-27 DIAGNOSIS — N390 Urinary tract infection, site not specified: Secondary | ICD-10-CM | POA: Insufficient documentation

## 2015-11-27 DIAGNOSIS — Z7982 Long term (current) use of aspirin: Secondary | ICD-10-CM | POA: Insufficient documentation

## 2015-11-27 DIAGNOSIS — Z79899 Other long term (current) drug therapy: Secondary | ICD-10-CM | POA: Insufficient documentation

## 2015-11-27 DIAGNOSIS — R339 Retention of urine, unspecified: Secondary | ICD-10-CM | POA: Diagnosis not present

## 2015-11-27 DIAGNOSIS — R338 Other retention of urine: Secondary | ICD-10-CM

## 2015-11-27 DIAGNOSIS — R300 Dysuria: Secondary | ICD-10-CM | POA: Diagnosis present

## 2015-11-27 LAB — BASIC METABOLIC PANEL
Anion gap: 3 — ABNORMAL LOW (ref 5–15)
BUN: 14 mg/dL (ref 6–20)
CALCIUM: 9 mg/dL (ref 8.9–10.3)
CO2: 38 mmol/L — ABNORMAL HIGH (ref 22–32)
CREATININE: 0.69 mg/dL (ref 0.61–1.24)
Chloride: 95 mmol/L — ABNORMAL LOW (ref 101–111)
GFR calc Af Amer: >60 mL/min (ref >60)
GLUCOSE: 92 mg/dL (ref 65–99)
POTASSIUM: 3.7 mmol/L (ref 3.5–5.1)
SODIUM: 136 mmol/L (ref 135–145)

## 2015-11-27 LAB — URINALYSIS, ROUTINE W REFLEX MICROSCOPIC
Bilirubin Urine: NEGATIVE
GLUCOSE, UA: NEGATIVE mg/dL
KETONES UR: NEGATIVE mg/dL
Nitrite: NEGATIVE
PROTEIN: NEGATIVE mg/dL
Specific Gravity, Urine: 1.008 (ref 1.005–1.030)
pH: 7 (ref 5.0–8.0)

## 2015-11-27 LAB — URINE MICROSCOPIC-ADD ON

## 2015-11-27 NOTE — ED Triage Notes (Signed)
Patient had a prostate on Thursday. Patient had been able to ambulate after surgery until today. Patient states difficulty and painful urination starting this morning.

## 2015-11-27 NOTE — ED Notes (Signed)
Patient was alert, oriented and stable upon discharge. RN went over AVS and patient had no further questions. Pt given leg bag for home catheter.

## 2015-11-27 NOTE — ED Provider Notes (Signed)
[facility] DEPT Provider Note   CSN: [id number] Arrival date & time: 11/27/15  1536    History   Chief Complaint Chief Complaint  Patient presents with  . Urinary Retention  . Dysuria    HPI Tommy Turner is a 75 y.o. male who presents with urinary retention. Had TURP 3 days ago. Foley catheter was removed 2 days ago. Urinating normally until this morning and has only been able to get out a few drops. Has some pain at the tip of his penis. No fevers, back pain, abdominal pain. Denies shortness of breath besides his chronic dyspnea from COPD. He is chronically on 2 L oxygen. No new shorts of breath or chest pain. Prior to the surgery he had had a Foley catheter for several months.  HPI  Past Medical History:  Diagnosis Date  . AAA (abdominal aortic aneurysm) ([place])   . Aneurysm ([place])   . Anxiety    takes Xanax daily as needed  . CAD (coronary artery disease)   . CHF (congestive heart failure) ([place])   . COPD (chronic obstructive pulmonary disease) ([facility])    Albuterol inhaler and neb daily as needed;takes SPiriva daily  . Depression    takes Celexa daily  . Foley catheter in place   . History of shingles   . Hyperlipidemia    takes Atorvastatin daily  . Hypertension    takes Metoprolol and Benazepril  daily  . Myocardial infarction ([place])    20+yrs ago  . Oxygen dependent    2L/[place]   . Peripheral edema    takes Furosemide daily  . Shortness of breath     on oxygen  2 liters continuous  . Ulcer    gastric    Patient Active Problem List   Diagnosis Date Noted  . BPH (benign prostatic hypertrophy) with urinary retention 07/01/2015  . Hypotension 05/24/2015  . Systolic CHF, chronic ([place]) 05/24/2015  . Protein-calorie malnutrition, severe 05/04/2015  . Acute on chronic respiratory failure with hypoxia ([place]) 05/03/2015  . Chronic systolic CHF (congestive heart failure) ([place]) 05/03/2015  . Leg swelling- Bilateral leg / foot 10/21/2014  . Abdominal aortic aneurysm  without rupture ([place]) 09/14/2014  . Dyspnea 06/03/2014  . Elevated PSA 03/09/2014  . Peripheral vascular disease, unspecified ([place]) 01/22/2014  . Chronic pain syndrome 10/27/2013  . Hyperglycemia 04/09/2013  . Acute respiratory failure with hypoxia ([place]) 04/09/2013  . COPD with acute exacerbation ([place]) 04/05/2013  . COPD exacerbation ([place]) 04/05/2013  . Loss of weight 03/18/2013  . Early satiety 03/18/2013  . Dysphagia, unspecified(787.20) 03/18/2013  . COPD (chronic obstructive pulmonary disease) ([place]) 10/31/2012  . Hyperlipemia 10/31/2012  . CAD (coronary artery disease) 10/31/2012  . Anxiety 10/31/2012  . Abdominal aortic aneurysm ([place]) 02/02/2011  . TOBACCO ABUSE 09/20/2009  . PEPTIC ULCER DISEASE 09/20/2009  . Essential hypertension 09/19/2009  . BRONCHITIS, CHRONIC 09/19/2009    Past Surgical History:  Procedure Laterality Date  . ABDOMINAL AORTIC ENDOVASCULAR STENT GRAFT N/A 09/14/2014   Procedure: ABDOMINAL AORTIC ENDOVASCULAR STENT GRAFT;  Surgeon: Conrad San Fernando, MD;  Location: [facility];  Service: Vascular;  Laterality: N/A;  . CORONARY ARTERY BYPASS GRAFT  20+yrs ago   x 6  . ESOPHAGOGASTRODUODENOSCOPY    . HERNIA REPAIR Left    inguinal  . LIPOMA EXCISION     right lower back  . PARTIAL GASTRECTOMY     approx 1990  . TONSILLECTOMY     as a child  . TRANSURETHRAL RESECTION OF PROSTATE  N/A 11/24/2015   Procedure: TRANSURETHRAL RESECTION OF THE PROSTATE (TURP);  Surgeon: Irine Seal, MD;  Location: [facility];  Service: Urology;  Laterality: N/A;       Home Medications    Prior to Admission medications   Medication Sig Start Date End Date Taking? Authorizing Provider  albuterol (PROVENTIL HFA;VENTOLIN HFA) 108 (90 Base) MCG/ACT inhaler Inhale 2 puffs into the lungs every 4 (four) hours as needed for wheezing or shortness of breath.   Yes Historical Provider, MD  albuterol (PROVENTIL) (2.5 MG/3ML) 0.083% nebulizer solution Take 2.5 mg by nebulization every 4 (four) hours  as needed for wheezing or shortness of breath.   Yes Historical Provider, MD  aspirin EC 81 MG tablet Take 81 mg by mouth daily.    Yes Historical Provider, MD  atorvastatin (LIPITOR) 80 MG tablet Take 1 tablet (80 mg total) by mouth daily. 06/06/15  Yes [name], MD  benazepril (LOTENSIN) 5 MG tablet Take 1 tablet (5 mg total) by mouth daily. 06/06/15  Yes [name], MD  budesonide-formoterol (SYMBICORT) 160-4.5 MCG/ACT inhaler Inhale 2 puffs into the lungs 2 (two) times daily. 06/06/15  Yes [name], MD  cephALEXin (KEFLEX) 500 MG capsule Take 1 capsule (500 mg total) by mouth 3 (three) times daily. 11/25/15  Yes Irine Seal, MD  citalopram (CELEXA) 20 MG tablet Take 20 mg by mouth daily.   Yes Historical Provider, MD  clonazePAM (KLONOPIN) 0.5 MG tablet Take 1 tablet (0.5 mg total) by mouth 2 (two) times daily as needed for anxiety. 11/18/15  Yes [name], MD  finasteride (PROSCAR) 5 MG tablet Take 5 mg by mouth daily.   Yes Historical Provider, MD  HYDROcodone-acetaminophen (NORCO) 10-325 MG tablet Take 1 tablet by mouth every 6 (six) hours as needed for severe pain. 11/18/15  Yes [name], MD  metoprolol succinate (TOPROL-XL) 25 MG 24 hr tablet Take 0.5 tablets (12.5 mg total) by mouth 2 (two) times daily. 08/01/15  Yes [name], MD  nitroGLYCERIN (NITROSTAT) 0.4 MG SL tablet Place 1 tablet (0.4 mg total) under the tongue every 5 (five) minutes as needed for chest pain. 08/02/15  Yes [name], MD  potassium chloride SA (K-DUR,KLOR-CON) 20 MEQ tablet Take 20 mEq by mouth daily.   Yes Historical Provider, MD  tamsulosin (FLOMAX) 0.4 MG CAPS capsule Take 2 capsules (0.8 mg total) by mouth at bedtime. 06/30/15  Yes [name], MD  tiotropium (SPIRIVA) 18 MCG inhalation capsule Place 18 mcg into inhaler and inhale daily.   Yes Historical Provider, MD  torsemide (DEMADEX) 20 MG tablet Take 2 tablets (40 mg total) by mouth daily. 07/14/15  Yes [name], MD     Family History Family History  Problem Relation Age of Onset  . Hypertension Brother   . Heart disease Brother     Heart Disease before age 71  . Heart attack Brother   . Cancer Brother   . Cancer Mother   . Diabetes Mother   . Heart disease Mother     Heart Disease before age 40  . Hypertension Mother   . Heart attack Mother   . Cancer Father     prostate  . Hypertension Father   . COPD Sister   . Diabetes Sister   . Heart disease Sister   . Hypertension Sister   . Heart attack Sister   . Colon cancer Neg Hx     Social History Social  History  Substance Use Topics  . Smoking status: Former Smoker    Packs/day: 0.25    Years: 58.00    Types: Cigarettes    Quit date: 11/02/2014  . Smokeless tobacco: Never Used     Comment: vapor ciggs  . Alcohol use No     Allergies   Review of patient's allergies indicates no known allergies.   Review of Systems Review of Systems  Constitutional: Negative for fever.  Respiratory: Negative for shortness of breath.   Cardiovascular: Negative for chest pain.  Gastrointestinal: Negative for abdominal pain and vomiting.  Genitourinary: Positive for difficulty urinating and dysuria.  Musculoskeletal: Negative for back pain.  All other systems reviewed and are negative.    Physical Exam Updated Vital Signs BP 113/69   Pulse 64   Temp 98 F (36.7 C) (Oral)   Resp 23   Ht 5\' 7"  (1.702 m)   Wt 128 lb (58.1 kg)   SpO2 96%   BMI 20.05 kg/m   Physical Exam  Constitutional: He is oriented to person, place, and time. He appears well-developed and well-nourished. No distress.  HENT:  Head: Normocephalic and atraumatic.  Right Ear: External ear normal.  Left Ear: External ear normal.  Nose: Nose normal.  Eyes: Right eye exhibits no discharge. Left eye exhibits no discharge.  Neck: Neck supple.  Cardiovascular: Normal rate, regular rhythm and normal heart sounds.   Pulmonary/Chest: Effort normal and breath sounds  normal.  Abdominal: Soft. He exhibits distension (lower abdominal). There is no tenderness. There is no CVA tenderness.  Musculoskeletal: He exhibits no edema.  Neurological: He is alert and oriented to person, place, and time.  Skin: Skin is warm and dry. He is not diaphoretic.  Nursing note and vitals reviewed.    ED Treatments / Results  Labs (all labs ordered are listed, but only abnormal results are displayed) Labs Reviewed  URINALYSIS, ROUTINE W REFLEX MICROSCOPIC (NOT AT [facility]) - Abnormal; Notable for the following:       Result Value   Hgb urine dipstick LARGE (*)    Leukocytes, UA TRACE (*)    All other components within normal limits  BASIC METABOLIC PANEL - Abnormal; Notable for the following:    Chloride 95 (*)    CO2 38 (*)    Anion gap 3 (*)    All other components within normal limits  URINE MICROSCOPIC-ADD ON - Abnormal; Notable for the following:    Squamous Epithelial / LPF 0-5 (*)    Bacteria, UA RARE (*)    All other components within normal limits    EKG  EKG Interpretation None       Radiology No results found.  Procedures Procedures (including critical care time)  Medications Ordered in ED Medications - No data to display   Initial Impression / Assessment and Plan / ED Course  I have reviewed the triage vital signs and the nursing notes.  Pertinent labs & imaging results that were available during my care of the patient were reviewed by me and considered in my medical decision making (see chart for details).  Clinical Course  Comment By Time  Nurse did bladder scan, has 650 mL. Will place foley catheter. Will check BMP. Noted to be hypoxic and hypotensive initially but on recheck 3 min later vital signs are unremarkable. Has remained normo-tensive and no hypoxia on his 2L O2 Sherwood Gambler, MD 07/23 1613  Urine has blood but no infection. Continuing to drain in catheter. Will  d/c with foley and f/u with his urologist. He is already on  flomax. VS remain stable. Sherwood Gambler, MD 07/23 1849    Final Clinical Impressions(s) / ED Diagnoses   Final diagnoses:  Acute urinary retention    New Prescriptions New Prescriptions   No medications on file     Sherwood Gambler, MD 11/27/15 1851

## 2015-11-29 ENCOUNTER — Telehealth: Payer: Self-pay | Admitting: Family Medicine

## 2015-11-29 DIAGNOSIS — I10 Essential (primary) hypertension: Secondary | ICD-10-CM

## 2015-11-29 NOTE — Telephone Encounter (Signed)
Received fax from [facility] for a refill request on patient's Klor-Con M20 Tablet with directions to take 1 tablet by mouth daily. This medication was prescribe by a historical provider. May we refill?

## 2015-11-29 NOTE — Telephone Encounter (Signed)
Please clarify with the patient and his wife if he has been taking this? If he has you may refill it but also have patient do a metabolic 7 within 14 days to make sure his potassium is staying good-if he has not been taking it then do not fill it

## 2015-11-29 NOTE — Telephone Encounter (Signed)
Left message return call. 11/29/15 

## 2015-12-01 MED ORDER — POTASSIUM CHLORIDE CRYS ER 20 MEQ PO TBCR: 20.0000 meq | EXTENDED_RELEASE_TABLET | Freq: Every day | ORAL | 0 refills | Status: DC

## 2015-12-01 MED FILL — KLOR-CON M20 TABLET: 20 | 90 days supply | Qty: 90 | Fill #0

## 2015-12-01 NOTE — Telephone Encounter (Signed)
Spoke with patient's wife and patient's wife stated that patient is taking the potassium once a day. Informed her that we can reorder but patient will need to have a BMET drawn in 14 days. Patient wife verbalized understanding. BMET ordered and refill on medictation sent into pharmacy per Dr.Scott Luking.

## 2015-12-01 NOTE — Addendum Note (Signed)
Addended by: Ofilia Neas R on: 12/01/2015 10:41 AM   Modules accepted: Orders

## 2015-12-02 DIAGNOSIS — J449 Chronic obstructive pulmonary disease, unspecified: Secondary | ICD-10-CM | POA: Diagnosis not present

## 2015-12-05 MED FILL — TAMSULOSIN HCL 0.4 MG CAP: 0.4 | 30 days supply | Qty: 30 | Fill #0

## 2015-12-14 ENCOUNTER — Other Ambulatory Visit: Payer: Self-pay | Admitting: *Deleted

## 2015-12-14 MED ORDER — ALBUTEROL SULFATE (2.5 MG/3ML) 0.083% IN NEBU: 2.5000 mg | INHALATION_SOLUTION | RESPIRATORY_TRACT | 12 refills | Status: DC | PRN

## 2015-12-14 MED FILL — ALBUTEROL 0.083% INHAL SOLN: (2.5 MG/3ML | 6 days supply | Qty: 75 | Fill #0

## 2015-12-15 MED FILL — FINASTERIDE 5 MG TABLET: 5 | 30 days supply | Qty: 30 | Fill #3

## 2015-12-16 ENCOUNTER — Ambulatory Visit (INDEPENDENT_AMBULATORY_CARE_PROVIDER_SITE_OTHER): Payer: Self-pay | Admitting: Urology

## 2015-12-16 ENCOUNTER — Other Ambulatory Visit (HOSPITAL_COMMUNITY)
Admission: RE | Admit: 2015-12-16 | Discharge: 2015-12-16 | Disposition: A | Discharge status: Home / Self Care | Payer: 59 | Source: Other Acute Inpatient Hospital | Attending: Family Medicine | Admitting: Family Medicine

## 2015-12-16 DIAGNOSIS — N401 Enlarged prostate with lower urinary tract symptoms: Secondary | ICD-10-CM | POA: Diagnosis not present

## 2015-12-16 DIAGNOSIS — R338 Other retention of urine: Secondary | ICD-10-CM

## 2015-12-16 LAB — URINE MICROSCOPIC-ADD ON: Squamous Epithelial / LPF: NONE SEEN

## 2015-12-16 LAB — URINALYSIS, ROUTINE W REFLEX MICROSCOPIC
BILIRUBIN URINE: NEGATIVE
GLUCOSE, UA: NEGATIVE mg/dL
KETONES UR: NEGATIVE mg/dL
Nitrite: NEGATIVE
PH: 7 (ref 5.0–8.0)
Protein, ur: NEGATIVE mg/dL
Specific Gravity, Urine: <1.005 — ABNORMAL LOW (ref 1.005–1.030)

## 2015-12-18 LAB — URINE CULTURE: Culture: >=100000 — AB

## 2015-12-20 MED FILL — VENTOLIN HFA 90 MCG INHALER: 108 (90 BAS | 25 days supply | Qty: 18 | Fill #1

## 2016-01-02 DIAGNOSIS — J449 Chronic obstructive pulmonary disease, unspecified: Secondary | ICD-10-CM | POA: Diagnosis not present

## 2016-01-10 ENCOUNTER — Telehealth: Payer: Self-pay | Admitting: Family Medicine

## 2016-01-10 NOTE — Telephone Encounter (Signed)
Pt needs appt Having lots of confusion mostly at night, just don't feel good   Asking for appt for this Demetrius Charity or Fri due to that's wife's off days to bring him  Only appts are same day, please advise

## 2016-01-10 NOTE — Telephone Encounter (Signed)
May have Thursday appointment

## 2016-01-11 NOTE — Telephone Encounter (Signed)
LMOM to call back to schedule

## 2016-01-12 MED FILL — clonazePAM 0.5 MG TABS: 0.5 | 30 days supply | Qty: 60 | Fill #0

## 2016-01-13 ENCOUNTER — Other Ambulatory Visit: Payer: Self-pay | Admitting: Adult Health

## 2016-01-13 ENCOUNTER — Ambulatory Visit (INDEPENDENT_AMBULATORY_CARE_PROVIDER_SITE_OTHER): Payer: 59 | Admitting: Family Medicine

## 2016-01-13 ENCOUNTER — Ambulatory Visit (HOSPITAL_COMMUNITY)
Admission: RE | Admit: 2016-01-13 | Discharge: 2016-01-13 | Disposition: A | Discharge status: Home / Self Care | Payer: 59 | Source: Ambulatory Visit | Attending: Family Medicine | Admitting: Family Medicine

## 2016-01-13 ENCOUNTER — Encounter: Payer: Self-pay | Admitting: Family Medicine

## 2016-01-13 VITALS — BP 118/82 | Ht 67.0 in | Wt 135.4 lb

## 2016-01-13 DIAGNOSIS — R0600 Dyspnea, unspecified: Secondary | ICD-10-CM | POA: Insufficient documentation

## 2016-01-13 DIAGNOSIS — J438 Other emphysema: Secondary | ICD-10-CM | POA: Diagnosis not present

## 2016-01-13 DIAGNOSIS — J439 Emphysema, unspecified: Secondary | ICD-10-CM | POA: Diagnosis not present

## 2016-01-13 DIAGNOSIS — I255 Ischemic cardiomyopathy: Secondary | ICD-10-CM

## 2016-01-13 LAB — BLOOD GAS, ARTERIAL
Acid-Base Excess: 11.1 mmol/L — ABNORMAL HIGH (ref 0.0–2.0)
Bicarbonate: 32.9 mmol/L — ABNORMAL HIGH (ref 20.0–28.0)
DRAWN BY: 221791
O2 Content: 2 L/min
O2 Saturation: 77.5 %
PH ART: 7.405 (ref 7.350–7.450)
Patient temperature: 37
TCO2: 13.3 mmol/L (ref 0–100)
pCO2 arterial: 59.1 mmHg — ABNORMAL HIGH (ref 32.0–48.0)
pO2, Arterial: 39.9 mmHg — CL (ref 83.0–108.0)

## 2016-01-13 MED ORDER — HYDROCODONE-ACETAMINOPHEN 10-325 MG PO TABS: 0.5000 | ORAL_TABLET | Freq: Four times a day (QID) | ORAL | 0 refills | Status: DC | PRN

## 2016-01-13 MED FILL — TORSEMIDE 20 MG TABLET: 20 | 90 days supply | Qty: 180 | Fill #2

## 2016-01-13 MED FILL — METOPROLOL SUCC ER 25 MG TA: 25 | 90 days supply | Qty: 90 | Fill #1

## 2016-01-13 NOTE — Addendum Note (Signed)
Addended by: Dairl Ponder on: 01/13/2016 02:15 PM   Modules accepted: Orders

## 2016-01-13 NOTE — Progress Notes (Signed)
   Subjective:    Patient ID: Tommy Turner, male    DOB: 07-14-1940, 75 y.o.   MRN: [id number]  HPI Patient arrives with c/o confusion at night and sleeping all the time This been going on over the past 4-6 weeks. Not depressed. Does occasionally take nerve medicine occasion takes her pain medicine.  Review of Systems Patient sleeps a lot breathing pattern is normal no vomiting or diarrhea no fever or chills    Objective:   Physical Exam Patient alert no obvious issues lungs are clear hearts regular pulse normal       Assessment & Plan:  Advanced COPD along with increased fatigue and sleepiness I believe that this is related to hypercapnia from his advanced COPD his O2 sat in 02 saturation is low I am hesitant to increase the oxygen any because of the risk of causing COPD 2 depression. If he starts having any severe somnolence for which family can't wake him up the family needs to immediately bring him to the hospital via [place] patient in one month  I recommended to reduce the pain medication to a half a tablet every 6 hours when necessary for pain also recommended to reduce in her medicine to a half a tablet.

## 2016-01-18 ENCOUNTER — Telehealth: Payer: Self-pay | Admitting: Family Medicine

## 2016-01-18 DIAGNOSIS — T415X4A Poisoning by therapeutic gases, undetermined, initial encounter: Secondary | ICD-10-CM

## 2016-01-18 NOTE — Addendum Note (Signed)
Addended by: Jesusita Oka on: 01/18/2016 11:01 AM   Modules accepted: Orders

## 2016-01-18 NOTE — Telephone Encounter (Signed)
Referral in

## 2016-01-18 NOTE — Telephone Encounter (Signed)
I discussed the results with [facility] regarding the ABG. Patient is holding on to wait too much CO2. This is a sign of end-stage COPD. We will be following up the patient in early October. Patient is very sleepy all the time because of elevated CO2. Nurse's-please put in consultation for Dr. Luan Pulling urgent area [place]-I did speak with Dr. Luan Pulling regarding this patient. They stated they would be willing to see the patient for follow-up consultation somewhere within the next couple weeks. Please send this information over and asked symptoms for urgent consultation thank you

## 2016-01-23 MED FILL — ALBUTEROL 0.083% INHAL SOLN: (2.5 MG/3ML | 6 days supply | Qty: 75 | Fill #1

## 2016-01-26 MED FILL — VENTOLIN HFA 90 MCG INHALER: 108 (90 BAS | 25 days supply | Qty: 18 | Fill #2

## 2016-02-02 DIAGNOSIS — J449 Chronic obstructive pulmonary disease, unspecified: Secondary | ICD-10-CM | POA: Diagnosis not present

## 2016-02-06 ENCOUNTER — Other Ambulatory Visit: Payer: Self-pay | Admitting: Adult Health

## 2016-02-08 ENCOUNTER — Other Ambulatory Visit: Payer: Self-pay | Admitting: *Deleted

## 2016-02-08 MED ORDER — BENAZEPRIL HCL 5 MG PO TABS: 5.0000 mg | ORAL_TABLET | Freq: Every day | ORAL | 0 refills | Status: DC

## 2016-02-08 MED FILL — BENAZEPRIL HCL 5 MG TABLET: 5 | 90 days supply | Qty: 90 | Fill #0

## 2016-02-10 ENCOUNTER — Ambulatory Visit: Payer: 59 | Admitting: Family Medicine

## 2016-02-14 ENCOUNTER — Ambulatory Visit (INDEPENDENT_AMBULATORY_CARE_PROVIDER_SITE_OTHER): Payer: 59 | Admitting: Family Medicine

## 2016-02-14 ENCOUNTER — Encounter: Payer: Self-pay | Admitting: Family Medicine

## 2016-02-14 VITALS — BP 112/68 | Ht 67.0 in | Wt 128.0 lb

## 2016-02-14 DIAGNOSIS — I255 Ischemic cardiomyopathy: Secondary | ICD-10-CM

## 2016-02-14 DIAGNOSIS — Z23 Encounter for immunization: Secondary | ICD-10-CM | POA: Diagnosis not present

## 2016-02-14 DIAGNOSIS — D508 Other iron deficiency anemias: Secondary | ICD-10-CM | POA: Diagnosis not present

## 2016-02-14 DIAGNOSIS — R634 Abnormal weight loss: Secondary | ICD-10-CM

## 2016-02-14 DIAGNOSIS — J431 Panlobular emphysema: Secondary | ICD-10-CM | POA: Diagnosis not present

## 2016-02-14 MED ORDER — POTASSIUM CHLORIDE CRYS ER 20 MEQ PO TBCR: 20.0000 meq | EXTENDED_RELEASE_TABLET | Freq: Every day | ORAL | 3 refills | Status: DC

## 2016-02-14 NOTE — Progress Notes (Signed)
   Subjective:    Patient ID: Tommy Turner, male    DOB: 09/15/1940, 75 y.o.   MRN: [id number]  HPI This patient was seen today for chronic pain  The medication list was reviewed and updated.   -Compliance with medication: pt states he has not taken any pain meds since last visit  - Number patient states they take daily: pt states he has not taken any pain meds since last visit.   -when was the last dose patient took? Since last visit  The patient was advised the importance of maintaining medication and not using illegal substances with these.  Refills needed: no  The patient was educated that we can provide 3 monthly scripts for their medication, it is their responsibility to follow the instructions.  Side effects or complications from medications: none  Patient is aware that pain medications are meant to minimize the severity of the pain to allow their pain levels to improve to allow for better function. They are aware of that pain medications cannot totally remove their pain.  Due for UDT ( at least once per year) : due today but pt states he has not taken any pain meds since last visit. States dr told him to hold off on taking pain meds. Pain contact given today. No urine drug screen today since pt has not taken meds.  Although the patient came in today for chronic pain visit he has not been taking pain medication at all recently so therefore there is no need to refill this medicine or to do a urine drug screen  Patient has severe COPD he is trying eat relatively healthy and try to take in adequate calories. Has been losing weight. Has a lot of daytime sleepiness has known issues with retention of CO2 Flu vaccine today.   Pt states no concerns.   Needs refill on potassium.   He relates compliance with blood pressure medicine depression medicine diuretic and potassium    Review of Systems  Constitutional: Negative for activity change, fatigue and fever.  Respiratory:  Positive for shortness of breath. Negative for cough.   Cardiovascular: Negative for chest pain and leg swelling.  Neurological: Negative for headaches.       Objective:   Physical Exam  Constitutional: He appears well-nourished. No distress.  Cardiovascular: Normal rate, regular rhythm and normal heart sounds.   No murmur heard. Pulmonary/Chest: Effort normal and breath sounds normal. No respiratory distress.  Musculoskeletal: He exhibits no edema.  Lymphadenopathy:    He has no cervical adenopathy.  Neurological: He is alert.  Psychiatric: His behavior is normal.  Vitals reviewed.         Assessment & Plan:  Weight loss encourage dietary intake probably related to the emphysema  Emphysema in stage hypercapnia referral back to Dr. Luan Pulling for his input  Follow-up in 2 months time  Comprehensive lab work in a couple months time follow-up sooner from

## 2016-02-14 NOTE — Progress Notes (Signed)
Referral put in.

## 2016-02-14 NOTE — Addendum Note (Signed)
Addended by: Carmelina Noun on: 02/14/2016 04:28 PM   Modules accepted: Orders

## 2016-02-15 LAB — CBC WITH DIFFERENTIAL/PLATELET
BASOS: 0 %
Basophils Absolute: 0 10*3/uL (ref 0.0–0.2)
EOS (ABSOLUTE): 0 10*3/uL (ref 0.0–0.4)
EOS: 0 %
HEMATOCRIT: 43.6 % (ref 37.5–51.0)
HEMOGLOBIN: 14.8 g/dL (ref 12.6–17.7)
IMMATURE GRANULOCYTES: 0 %
Immature Grans (Abs): 0 10*3/uL (ref 0.0–0.1)
LYMPHS ABS: 1.9 10*3/uL (ref 0.7–3.1)
Lymphs: 25 %
MCH: 31.2 pg (ref 26.6–33.0)
MCHC: 33.9 g/dL (ref 31.5–35.7)
MCV: 92 fL (ref 79–97)
MONOCYTES: 7 %
MONOS ABS: 0.6 10*3/uL (ref 0.1–0.9)
Neutrophils Absolute: 5.1 10*3/uL (ref 1.4–7.0)
Neutrophils: 68 %
Platelets: 136 10*3/uL — ABNORMAL LOW (ref 150–379)
RBC: 4.75 x10E6/uL (ref 4.14–5.80)
RDW: 12.7 % (ref 12.3–15.4)
WBC: 7.7 10*3/uL (ref 3.4–10.8)

## 2016-02-15 LAB — IRON AND TIBC
IRON SATURATION: 22 % (ref 15–55)
Iron: 61 ug/dL (ref 38–169)
Total Iron Binding Capacity: 281 ug/dL (ref 250–450)
UIBC: 220 ug/dL (ref 111–343)

## 2016-02-15 LAB — TSH: TSH: 0.575 u[IU]/mL (ref 0.450–4.500)

## 2016-02-15 LAB — BASIC METABOLIC PANEL
BUN/Creatinine Ratio: 28 — ABNORMAL HIGH (ref 10–24)
BUN: 16 mg/dL (ref 8–27)
CO2: 36 mmol/L — ABNORMAL HIGH (ref 18–29)
Calcium: 9.5 mg/dL (ref 8.6–10.2)
Chloride: 93 mmol/L — ABNORMAL LOW (ref 96–106)
Creatinine, Ser: 0.58 mg/dL — ABNORMAL LOW (ref 0.76–1.27)
GFR, EST AFRICAN AMERICAN: 114 mL/min/{1.73_m2} (ref >59)
GFR, EST NON AFRICAN AMERICAN: 99 mL/min/{1.73_m2} (ref >59)
Glucose: 98 mg/dL (ref 65–99)
POTASSIUM: 4.2 mmol/L (ref 3.5–5.2)
SODIUM: 141 mmol/L (ref 134–144)

## 2016-02-15 LAB — FERRITIN: Ferritin: 119 ng/mL (ref 30–400)

## 2016-02-22 MED FILL — SPIRIVA 18 MCG CP-HANDIHALE: 18 | 90 days supply | Qty: 90 | Fill #2

## 2016-02-27 MED FILL — KLOR-CON M20 TABLET: 20 | 90 days supply | Qty: 90 | Fill #0

## 2016-02-28 MED FILL — ALBUTEROL 0.083% INHAL SOLN: (2.5 MG/3ML | 6 days supply | Qty: 75 | Fill #2 | Status: TO

## 2016-03-03 DIAGNOSIS — J449 Chronic obstructive pulmonary disease, unspecified: Secondary | ICD-10-CM | POA: Diagnosis not present

## 2016-03-05 ENCOUNTER — Other Ambulatory Visit: Payer: Self-pay | Admitting: Family Medicine

## 2016-03-05 NOTE — Telephone Encounter (Signed)
May refill this +5 additional refills 

## 2016-03-06 MED FILL — clonazePAM 0.5 MG TABS: 0.5 | 30 days supply | Qty: 60 | Fill #0

## 2016-03-12 MED FILL — HYDROCODON-APAP 10-325: 10-325 | 15 days supply | Qty: 60 | Fill #0

## 2016-03-13 ENCOUNTER — Other Ambulatory Visit: Payer: Self-pay | Admitting: Family Medicine

## 2016-03-19 ENCOUNTER — Other Ambulatory Visit: Payer: Self-pay

## 2016-03-19 MED ORDER — TORSEMIDE 20 MG PO TABS: 40.0000 mg | ORAL_TABLET | Freq: Every day | ORAL | 1 refills | Status: DC

## 2016-03-19 MED FILL — TORSEMIDE 20 MG TABLET: 20 | 90 days supply | Qty: 180 | Fill #0

## 2016-03-23 NOTE — Patient Outreach (Signed)
Assigning Raina Mina RN to Mr. Fleurimond.

## 2016-03-28 ENCOUNTER — Other Ambulatory Visit: Payer: Self-pay | Admitting: *Deleted

## 2016-03-28 NOTE — Patient Outreach (Addendum)
[facility]) Care Management  03/28/2016  Tommy Turner 04-17-1941 [id number]   RN spoke with pt and introduced the [facility] program and services. Pt receptive to today's conversation and reports his wife who continues to work is responsible for arranging his pill box with all his medications. RN discussed pt's medical issues related to his COPD. All medications were reviewed with pt understanding however wife more familiar. Pt able to indicate when he has breathing problems he uses his medication if no improvement pt's contacts his provider. Pt is aware of his COPD medication but his caregiver is the organizer on the usage. RN discussed COPD action plan related to prevention measures. Pt receptive to ongoing telephonic case management but not sure how much this pt will retain.Pt does not wish to accept services without his spouse's permission.  I received verbal permission to discussed [facility] services with his wife but leaving a message to the home contact number with RN contacts if wife wishes to return the call with this request. If call received will reopen this case for ongoing telephonic services with a referral. No other community needs at this time.   Raina Mina, RN Care Management Coordinator [place] Office [phone]

## 2016-04-03 ENCOUNTER — Other Ambulatory Visit: Payer: Self-pay | Admitting: Family Medicine

## 2016-04-03 DIAGNOSIS — J449 Chronic obstructive pulmonary disease, unspecified: Secondary | ICD-10-CM | POA: Diagnosis not present

## 2016-04-03 MED FILL — METOPROLOL SUCC ER 25 MG TA: 25 | 90 days supply | Qty: 90 | Fill #0

## 2016-04-03 MED FILL — ALBUTEROL 0.083% INHAL SOLN: (2.5 MG/3ML | 4 days supply | Qty: 75 | Fill #0

## 2016-04-03 MED FILL — VENTOLIN HFA 90 MCG INHALER: 108 (90 BAS | 25 days supply | Qty: 18 | Fill #3

## 2016-04-04 ENCOUNTER — Inpatient Hospital Stay (HOSPITAL_COMMUNITY)
Admission: EM | Admit: 2016-04-04 | Discharge: 2016-04-17 | DRG: 871 | Disposition: A | Discharge status: Home Health | Payer: 59 | Attending: Nephrology | Admitting: Nephrology

## 2016-04-04 ENCOUNTER — Encounter (HOSPITAL_COMMUNITY): Payer: Self-pay | Admitting: *Deleted

## 2016-04-04 ENCOUNTER — Emergency Department (HOSPITAL_COMMUNITY): Payer: 59

## 2016-04-04 DIAGNOSIS — I519 Heart disease, unspecified: Secondary | ICD-10-CM | POA: Diagnosis not present

## 2016-04-04 DIAGNOSIS — Z8249 Family history of ischemic heart disease and other diseases of the circulatory system: Secondary | ICD-10-CM

## 2016-04-04 DIAGNOSIS — J9622 Acute and chronic respiratory failure with hypercapnia: Secondary | ICD-10-CM | POA: Diagnosis present

## 2016-04-04 DIAGNOSIS — I25708 Atherosclerosis of coronary artery bypass graft(s), unspecified, with other forms of angina pectoris: Secondary | ICD-10-CM | POA: Diagnosis not present

## 2016-04-04 DIAGNOSIS — I472 Ventricular tachycardia: Secondary | ICD-10-CM | POA: Diagnosis present

## 2016-04-04 DIAGNOSIS — I255 Ischemic cardiomyopathy: Secondary | ICD-10-CM | POA: Diagnosis present

## 2016-04-04 DIAGNOSIS — E872 Acidosis: Secondary | ICD-10-CM | POA: Diagnosis present

## 2016-04-04 DIAGNOSIS — R338 Other retention of urine: Secondary | ICD-10-CM | POA: Diagnosis present

## 2016-04-04 DIAGNOSIS — I10 Essential (primary) hypertension: Secondary | ICD-10-CM | POA: Diagnosis not present

## 2016-04-04 DIAGNOSIS — I252 Old myocardial infarction: Secondary | ICD-10-CM | POA: Diagnosis not present

## 2016-04-04 DIAGNOSIS — I248 Other forms of acute ischemic heart disease: Secondary | ICD-10-CM | POA: Diagnosis not present

## 2016-04-04 DIAGNOSIS — R778 Other specified abnormalities of plasma proteins: Secondary | ICD-10-CM

## 2016-04-04 DIAGNOSIS — G9341 Metabolic encephalopathy: Secondary | ICD-10-CM | POA: Diagnosis not present

## 2016-04-04 DIAGNOSIS — R0602 Shortness of breath: Secondary | ICD-10-CM | POA: Diagnosis not present

## 2016-04-04 DIAGNOSIS — M6281 Muscle weakness (generalized): Secondary | ICD-10-CM

## 2016-04-04 DIAGNOSIS — A419 Sepsis, unspecified organism: Principal | ICD-10-CM | POA: Diagnosis present

## 2016-04-04 DIAGNOSIS — I517 Cardiomegaly: Secondary | ICD-10-CM | POA: Diagnosis not present

## 2016-04-04 DIAGNOSIS — Z87891 Personal history of nicotine dependence: Secondary | ICD-10-CM

## 2016-04-04 DIAGNOSIS — R197 Diarrhea, unspecified: Secondary | ICD-10-CM | POA: Diagnosis not present

## 2016-04-04 DIAGNOSIS — R748 Abnormal levels of other serum enzymes: Secondary | ICD-10-CM | POA: Diagnosis not present

## 2016-04-04 DIAGNOSIS — T17890A Other foreign object in other parts of respiratory tract causing asphyxiation, initial encounter: Secondary | ICD-10-CM | POA: Diagnosis not present

## 2016-04-04 DIAGNOSIS — I5043 Acute on chronic combined systolic (congestive) and diastolic (congestive) heart failure: Secondary | ICD-10-CM | POA: Diagnosis not present

## 2016-04-04 DIAGNOSIS — J440 Chronic obstructive pulmonary disease with acute lower respiratory infection: Secondary | ICD-10-CM | POA: Diagnosis present

## 2016-04-04 DIAGNOSIS — Z7189 Other specified counseling: Secondary | ICD-10-CM

## 2016-04-04 DIAGNOSIS — J449 Chronic obstructive pulmonary disease, unspecified: Secondary | ICD-10-CM | POA: Diagnosis not present

## 2016-04-04 DIAGNOSIS — Z951 Presence of aortocoronary bypass graft: Secondary | ICD-10-CM

## 2016-04-04 DIAGNOSIS — Z7982 Long term (current) use of aspirin: Secondary | ICD-10-CM

## 2016-04-04 DIAGNOSIS — I739 Peripheral vascular disease, unspecified: Secondary | ICD-10-CM | POA: Diagnosis present

## 2016-04-04 DIAGNOSIS — E785 Hyperlipidemia, unspecified: Secondary | ICD-10-CM | POA: Diagnosis present

## 2016-04-04 DIAGNOSIS — G894 Chronic pain syndrome: Secondary | ICD-10-CM | POA: Diagnosis not present

## 2016-04-04 DIAGNOSIS — Z9981 Dependence on supplemental oxygen: Secondary | ICD-10-CM

## 2016-04-04 DIAGNOSIS — Z79899 Other long term (current) drug therapy: Secondary | ICD-10-CM

## 2016-04-04 DIAGNOSIS — Z66 Do not resuscitate: Secondary | ICD-10-CM | POA: Diagnosis present

## 2016-04-04 DIAGNOSIS — J9690 Respiratory failure, unspecified, unspecified whether with hypoxia or hypercapnia: Secondary | ICD-10-CM | POA: Diagnosis not present

## 2016-04-04 DIAGNOSIS — R443 Hallucinations, unspecified: Secondary | ICD-10-CM | POA: Diagnosis not present

## 2016-04-04 DIAGNOSIS — D649 Anemia, unspecified: Secondary | ICD-10-CM | POA: Diagnosis present

## 2016-04-04 DIAGNOSIS — I5022 Chronic systolic (congestive) heart failure: Secondary | ICD-10-CM | POA: Diagnosis not present

## 2016-04-04 DIAGNOSIS — J181 Lobar pneumonia, unspecified organism: Secondary | ICD-10-CM

## 2016-04-04 DIAGNOSIS — R061 Stridor: Secondary | ICD-10-CM | POA: Diagnosis not present

## 2016-04-04 DIAGNOSIS — Z903 Acquired absence of stomach [part of]: Secondary | ICD-10-CM

## 2016-04-04 DIAGNOSIS — Z720 Tobacco use: Secondary | ICD-10-CM

## 2016-04-04 DIAGNOSIS — R0902 Hypoxemia: Secondary | ICD-10-CM

## 2016-04-04 DIAGNOSIS — Z6820 Body mass index (BMI) 20.0-20.9, adult: Secondary | ICD-10-CM

## 2016-04-04 DIAGNOSIS — J189 Pneumonia, unspecified organism: Secondary | ICD-10-CM | POA: Diagnosis not present

## 2016-04-04 DIAGNOSIS — Z515 Encounter for palliative care: Secondary | ICD-10-CM | POA: Diagnosis not present

## 2016-04-04 DIAGNOSIS — I482 Chronic atrial fibrillation: Secondary | ICD-10-CM | POA: Diagnosis not present

## 2016-04-04 DIAGNOSIS — R509 Fever, unspecified: Secondary | ICD-10-CM

## 2016-04-04 DIAGNOSIS — D696 Thrombocytopenia, unspecified: Secondary | ICD-10-CM | POA: Diagnosis present

## 2016-04-04 DIAGNOSIS — N401 Enlarged prostate with lower urinary tract symptoms: Secondary | ICD-10-CM | POA: Diagnosis present

## 2016-04-04 DIAGNOSIS — I110 Hypertensive heart disease with heart failure: Secondary | ICD-10-CM | POA: Diagnosis present

## 2016-04-04 DIAGNOSIS — G9340 Encephalopathy, unspecified: Secondary | ICD-10-CM | POA: Diagnosis present

## 2016-04-04 DIAGNOSIS — Z7951 Long term (current) use of inhaled steroids: Secondary | ICD-10-CM

## 2016-04-04 DIAGNOSIS — I959 Hypotension, unspecified: Secondary | ICD-10-CM | POA: Diagnosis present

## 2016-04-04 DIAGNOSIS — Z9079 Acquired absence of other genital organ(s): Secondary | ICD-10-CM

## 2016-04-04 DIAGNOSIS — J9621 Acute and chronic respiratory failure with hypoxia: Secondary | ICD-10-CM | POA: Diagnosis present

## 2016-04-04 DIAGNOSIS — R0689 Other abnormalities of breathing: Secondary | ICD-10-CM | POA: Diagnosis not present

## 2016-04-04 DIAGNOSIS — I2510 Atherosclerotic heart disease of native coronary artery without angina pectoris: Secondary | ICD-10-CM | POA: Diagnosis present

## 2016-04-04 DIAGNOSIS — J441 Chronic obstructive pulmonary disease with (acute) exacerbation: Secondary | ICD-10-CM | POA: Diagnosis not present

## 2016-04-04 DIAGNOSIS — F419 Anxiety disorder, unspecified: Secondary | ICD-10-CM | POA: Diagnosis not present

## 2016-04-04 DIAGNOSIS — R7989 Other specified abnormal findings of blood chemistry: Secondary | ICD-10-CM

## 2016-04-04 DIAGNOSIS — R531 Weakness: Secondary | ICD-10-CM | POA: Diagnosis not present

## 2016-04-04 DIAGNOSIS — F418 Other specified anxiety disorders: Secondary | ICD-10-CM | POA: Diagnosis present

## 2016-04-04 DIAGNOSIS — J9611 Chronic respiratory failure with hypoxia: Secondary | ICD-10-CM | POA: Diagnosis present

## 2016-04-04 DIAGNOSIS — J9612 Chronic respiratory failure with hypercapnia: Secondary | ICD-10-CM

## 2016-04-04 DIAGNOSIS — I6789 Other cerebrovascular disease: Secondary | ICD-10-CM | POA: Diagnosis not present

## 2016-04-04 DIAGNOSIS — R64 Cachexia: Secondary | ICD-10-CM | POA: Diagnosis present

## 2016-04-04 DIAGNOSIS — Z825 Family history of asthma and other chronic lower respiratory diseases: Secondary | ICD-10-CM

## 2016-04-04 LAB — BLOOD GAS, ARTERIAL
ACID-BASE EXCESS: 4.6 mmol/L — AB (ref 0.0–2.0)
Acid-Base Excess: 5.2 mmol/L — ABNORMAL HIGH (ref 0.0–2.0)
Acid-Base Excess: 4.9 mmol/L — ABNORMAL HIGH (ref 0.0–2.0)
BICARBONATE: 27.1 mmol/L (ref 20.0–28.0)
Bicarbonate: 26.4 mmol/L (ref 20.0–28.0)
Bicarbonate: 26.1 mmol/L (ref 20.0–28.0)
DELIVERY SYSTEMS: POSITIVE
DELIVERY SYSTEMS: POSITIVE
DRAWN BY: 234301
DRAWN BY: 22223
Drawn by: 22223
EXPIRATORY PAP: 8
Expiratory PAP: 8
FIO2: 50
FIO2: 50
INSPIRATORY PAP: 20
Inspiratory PAP: 20
LHR: 10 {breaths}/min
LHR: 10 {breaths}/min
O2 CONTENT: 6 L/min
O2 Saturation: 89.4 %
O2 Saturation: 92.5 %
O2 Saturation: 94 %
PCO2 ART: 84.5 mmHg — AB (ref 32.0–48.0)
PH ART: 7.205 — AB (ref 7.350–7.450)
PO2 ART: 71.1 mmHg — AB (ref 83.0–108.0)
pCO2 arterial: 86.2 mmHg (ref 32.0–48.0)
pCO2 arterial: 69.5 mmHg (ref 32.0–48.0)
pH, Arterial: 7.204 — ABNORMAL LOW (ref 7.350–7.450)
pH, Arterial: 7.276 — ABNORMAL LOW (ref 7.350–7.450)
pO2, Arterial: 79.9 mmHg — ABNORMAL LOW (ref 83.0–108.0)
pO2, Arterial: 82.9 mmHg — ABNORMAL LOW (ref 83.0–108.0)

## 2016-04-04 LAB — URINE MICROSCOPIC-ADD ON

## 2016-04-04 LAB — CBC WITH DIFFERENTIAL/PLATELET
BASOS ABS: 0 10*3/uL (ref 0.0–0.1)
BASOS PCT: 0 %
Eosinophils Absolute: 0 10*3/uL (ref 0.0–0.7)
Eosinophils Relative: 0 %
HEMATOCRIT: 47.8 % (ref 39.0–52.0)
HEMOGLOBIN: 14.9 g/dL (ref 13.0–17.0)
LYMPHS PCT: 2 %
Lymphs Abs: 0.4 10*3/uL — ABNORMAL LOW (ref 0.7–4.0)
MCH: 31.5 pg (ref 26.0–34.0)
MCHC: 31.2 g/dL (ref 30.0–36.0)
MCV: 101.1 fL — ABNORMAL HIGH (ref 78.0–100.0)
Monocytes Absolute: 0.4 10*3/uL (ref 0.1–1.0)
Monocytes Relative: 2 %
NEUTROS ABS: 22.2 10*3/uL — AB (ref 1.7–7.7)
NEUTROS PCT: 96 %
Platelets: 142 10*3/uL — ABNORMAL LOW (ref 150–400)
RBC: 4.73 MIL/uL (ref 4.22–5.81)
RDW: 13.2 % (ref 11.5–15.5)
WBC: 23.1 10*3/uL — ABNORMAL HIGH (ref 4.0–10.5)

## 2016-04-04 LAB — BASIC METABOLIC PANEL
Anion gap: 7 (ref 5–15)
BUN: 22 mg/dL — AB (ref 6–20)
CHLORIDE: 99 mmol/L — AB (ref 101–111)
CO2: 35 mmol/L — AB (ref 22–32)
CREATININE: 0.72 mg/dL (ref 0.61–1.24)
Calcium: 8.8 mg/dL — ABNORMAL LOW (ref 8.9–10.3)
GFR calc non Af Amer: >60 mL/min (ref >60)
Glucose, Bld: 151 mg/dL — ABNORMAL HIGH (ref 65–99)
Potassium: 4.2 mmol/L (ref 3.5–5.1)
Sodium: 141 mmol/L (ref 135–145)

## 2016-04-04 LAB — STREP PNEUMONIAE URINARY ANTIGEN: STREP PNEUMO URINARY ANTIGEN: NEGATIVE

## 2016-04-04 LAB — URINALYSIS, ROUTINE W REFLEX MICROSCOPIC
Bilirubin Urine: NEGATIVE
Glucose, UA: NEGATIVE mg/dL
Hgb urine dipstick: NEGATIVE
KETONES UR: NEGATIVE mg/dL
LEUKOCYTES UA: NEGATIVE
NITRITE: NEGATIVE
PH: 5.5 (ref 5.0–8.0)
Protein, ur: 30 mg/dL — AB
Specific Gravity, Urine: 1.02 (ref 1.005–1.030)

## 2016-04-04 LAB — INFLUENZA PANEL BY PCR (TYPE A & B)
Influenza A By PCR: NEGATIVE
Influenza B By PCR: NEGATIVE

## 2016-04-04 LAB — LACTIC ACID, PLASMA
LACTIC ACID, VENOUS: 1.6 mmol/L (ref 0.5–1.9)
Lactic Acid, Venous: 1.2 mmol/L (ref 0.5–1.9)
Lactic Acid, Venous: 1 mmol/L (ref 0.5–1.9)
Lactic Acid, Venous: 1.1 mmol/L (ref 0.5–1.9)

## 2016-04-04 LAB — MRSA PCR SCREENING: MRSA by PCR: NEGATIVE

## 2016-04-04 LAB — PROCALCITONIN: Procalcitonin: 15.58 ng/mL

## 2016-04-04 LAB — TROPONIN I: Troponin I: 0.07 ng/mL (ref <0.03)

## 2016-04-04 MED ORDER — SODIUM CHLORIDE 0.9 % IV BOLUS (SEPSIS)
1000.0000 mL | Freq: Once | INTRAVENOUS | Status: AC
  Administered 2016-04-04: 1000 mL via INTRAVENOUS

## 2016-04-04 MED ORDER — SODIUM CHLORIDE 0.9 % IV SOLN
INTRAVENOUS | Status: DC
  Administered 2016-04-04: 13:00:00 via INTRAVENOUS

## 2016-04-04 MED ORDER — ALBUTEROL SULFATE (2.5 MG/3ML) 0.083% IN NEBU
2.5000 mg | INHALATION_SOLUTION | RESPIRATORY_TRACT | Status: DC | PRN
Start: 2016-04-04 — End: 2016-04-17

## 2016-04-04 MED ORDER — METOPROLOL SUCCINATE ER 25 MG PO TB24: 12.5000 mg | ORAL_TABLET | Freq: Every day | ORAL | Status: DC

## 2016-04-04 MED ORDER — ACETAMINOPHEN 325 MG PO TABS: 650.0000 mg | ORAL_TABLET | Freq: Four times a day (QID) | ORAL | Status: DC | PRN

## 2016-04-04 MED ORDER — AZITHROMYCIN 500 MG IV SOLR
500.0000 mg | INTRAVENOUS | Status: DC
  Administered 2016-04-04: 500 mg via INTRAVENOUS
  Filled 2016-04-04 (×2): qty 500

## 2016-04-04 MED ORDER — METHYLPREDNISOLONE SODIUM SUCC 125 MG IJ SOLR
125.0000 mg | Freq: Once | INTRAMUSCULAR | Status: AC
  Administered 2016-04-04: 125 mg via INTRAVENOUS
  Filled 2016-04-04: qty 2

## 2016-04-04 MED ORDER — SODIUM CHLORIDE 0.9 % IV SOLN
INTRAVENOUS | Status: DC
  Administered 2016-04-05: 02:00:00 via INTRAVENOUS

## 2016-04-04 MED ORDER — FINASTERIDE 5 MG PO TABS
5.0000 mg | ORAL_TABLET | Freq: Every day | ORAL | Status: DC
  Administered 2016-04-04 – 2016-04-17 (×14): 5 mg via ORAL
  Filled 2016-04-04 (×16): qty 1

## 2016-04-04 MED ORDER — ONDANSETRON HCL 4 MG/2ML IJ SOLN: 4.0000 mg | Freq: Four times a day (QID) | INTRAMUSCULAR | Status: DC | PRN

## 2016-04-04 MED ORDER — ENSURE ENLIVE PO LIQD
237.0000 mL | Freq: Two times a day (BID) | ORAL | Status: DC
  Administered 2016-04-05: 237 mL via ORAL

## 2016-04-04 MED ORDER — ACETAMINOPHEN 650 MG RE SUPP: 650.0000 mg | Freq: Four times a day (QID) | RECTAL | Status: DC | PRN

## 2016-04-04 MED ORDER — ACETAMINOPHEN 650 MG RE SUPP
650.0000 mg | Freq: Once | RECTAL | Status: AC
  Administered 2016-04-04: 650 mg via RECTAL
  Filled 2016-04-04: qty 1

## 2016-04-04 MED ORDER — VANCOMYCIN HCL IN DEXTROSE 1-5 GM/200ML-% IV SOLN
1000.0000 mg | Freq: Once | INTRAVENOUS | Status: AC
  Administered 2016-04-04: 1000 mg via INTRAVENOUS
  Filled 2016-04-04: qty 200

## 2016-04-04 MED ORDER — MOMETASONE FURO-FORMOTEROL FUM 200-5 MCG/ACT IN AERO
2.0000 | INHALATION_SPRAY | Freq: Two times a day (BID) | RESPIRATORY_TRACT | Status: DC
  Administered 2016-04-04 – 2016-04-17 (×23): 2 via RESPIRATORY_TRACT
  Filled 2016-04-04: qty 8.8

## 2016-04-04 MED ORDER — VANCOMYCIN HCL IN DEXTROSE 750-5 MG/150ML-% IV SOLN
750.0000 mg | Freq: Two times a day (BID) | INTRAVENOUS | Status: DC
  Administered 2016-04-05: 750 mg via INTRAVENOUS
  Filled 2016-04-04 (×3): qty 150

## 2016-04-04 MED ORDER — CEFTRIAXONE SODIUM 1 G IJ SOLR
1.0000 g | INTRAMUSCULAR | Status: AC
  Administered 2016-04-05 – 2016-04-11 (×7): 1 g via INTRAVENOUS
  Filled 2016-04-04 (×7): qty 10

## 2016-04-04 MED ORDER — HEPARIN SODIUM (PORCINE) 5000 UNIT/ML IJ SOLN: 5000.0000 [IU] | Freq: Three times a day (TID) | INTRAMUSCULAR | Status: DC

## 2016-04-04 MED ORDER — ATORVASTATIN CALCIUM 40 MG PO TABS
80.0000 mg | ORAL_TABLET | Freq: Every day | ORAL | Status: DC
  Administered 2016-04-05 – 2016-04-17 (×13): 80 mg via ORAL
  Filled 2016-04-04 (×13): qty 2

## 2016-04-04 MED ORDER — DEXTROSE 5 % IV SOLN
1.0000 g | Freq: Once | INTRAVENOUS | Status: AC
  Administered 2016-04-04: 1 g via INTRAVENOUS
  Filled 2016-04-04: qty 10

## 2016-04-04 MED ORDER — METHYLPREDNISOLONE SODIUM SUCC 125 MG IJ SOLR
60.0000 mg | Freq: Four times a day (QID) | INTRAMUSCULAR | Status: DC
  Administered 2016-04-04 – 2016-04-11 (×26): 60 mg via INTRAVENOUS
  Filled 2016-04-04 (×28): qty 2

## 2016-04-04 MED ORDER — ONDANSETRON HCL 4 MG PO TABS: 4.0000 mg | ORAL_TABLET | Freq: Four times a day (QID) | ORAL | Status: DC | PRN

## 2016-04-04 MED ORDER — DEXTROSE 5 % IV SOLN
500.0000 mg | INTRAVENOUS | Status: AC
  Administered 2016-04-05 – 2016-04-11 (×7): 500 mg via INTRAVENOUS
  Filled 2016-04-04 (×7): qty 500

## 2016-04-04 MED ORDER — IPRATROPIUM-ALBUTEROL 0.5-2.5 (3) MG/3ML IN SOLN
3.0000 mL | Freq: Four times a day (QID) | RESPIRATORY_TRACT | Status: DC
  Administered 2016-04-04 – 2016-04-12 (×31): 3 mL via RESPIRATORY_TRACT
  Filled 2016-04-04 (×31): qty 3

## 2016-04-04 MED ORDER — IPRATROPIUM-ALBUTEROL 0.5-2.5 (3) MG/3ML IN SOLN
3.0000 mL | Freq: Once | RESPIRATORY_TRACT | Status: AC
  Administered 2016-04-04: 3 mL via RESPIRATORY_TRACT
  Filled 2016-04-04: qty 3

## 2016-04-04 MED ORDER — GUAIFENESIN ER 600 MG PO TB12
1200.0000 mg | ORAL_TABLET | Freq: Two times a day (BID) | ORAL | Status: DC
  Administered 2016-04-04 – 2016-04-17 (×26): 1200 mg via ORAL
  Filled 2016-04-04 (×26): qty 2

## 2016-04-04 MED ORDER — ASPIRIN EC 81 MG PO TBEC
81.0000 mg | DELAYED_RELEASE_TABLET | Freq: Every day | ORAL | Status: DC
  Administered 2016-04-04 – 2016-04-17 (×14): 81 mg via ORAL
  Filled 2016-04-04 (×14): qty 1

## 2016-04-04 MED ORDER — ENOXAPARIN SODIUM 40 MG/0.4ML ~~LOC~~ SOLN
40.0000 mg | SUBCUTANEOUS | Status: DC
  Administered 2016-04-04 – 2016-04-16 (×13): 40 mg via SUBCUTANEOUS
  Filled 2016-04-04 (×13): qty 0.4

## 2016-04-04 NOTE — H&P (Signed)
History and Physical    SHAWNTAY WHITTET [id number] DOB: 1941-03-04 DOA: 04/04/2016  PCP: Sallee Lange, MD  Patient coming from: Home   Chief Complaint: Shortness of breath   HPI: Tommy Turner is a 75 y.o. male with medical history significant for AAA, anxiety, coronary artery disease, chronic systolic CHF EF 0000000, COPD oxygen dependent (3L), hypertension, hyperlipidemia, MI, and tobacco abuse, presents via EMS for gradually worsening shortness of breath that onset 8-10 hours prior to arrival. Patient reports that he was in his usual state of health yesterday when last night he began having shortness of breath. This progressed through the night. He was brought to the ER for evaluation where he was noted to be febrile. He does report a cough. He is somewhat somnolent right now and currently on BiPAP therefore history is limited. Daughter is at bedside who does not have any significant contribution history.    ED Course: On arrival to the emergency room, he was noted to be tachypneic, tachycardic and hypotensive. Patient was started on BiPAP for respiratory support. Chest x-ray indicated left-sided pneumonia. WBC count was noted to be elevated at 23,000. He was started on treatment for sepsis with IV fluids and antibiotics. He received a dose of Solu-Medrol. Respiratory status has improved and he was breathing more comfortably on BiPAP. He has been referred for admission.  Review of Systems: As per HPI otherwise 10 point review of systems negative.   Past Medical History:  Diagnosis Date  . AAA (abdominal aortic aneurysm) ([place])   . Aneurysm ([place])   . Anxiety    takes Xanax daily as needed  . CAD (coronary artery disease)   . CHF (congestive heart failure) ([place])   . COPD (chronic obstructive pulmonary disease) (H[facility])    Albuterol inhaler and neb daily as needed;takes SPiriva daily  . Depression    takes Celexa daily  . Foley catheter in place   . History of shingles   .  Hyperlipidemia    takes Atorvastatin daily  . Hypertension    takes Metoprolol and Benazepril  daily  . Myocardial infarction    20+yrs ago  . Oxygen dependent    2L/[place]   . Peripheral edema    takes Furosemide daily  . Shortness of breath     on oxygen  2 liters continuous  . Ulcer ([place])    gastric    Past Surgical History:  Procedure Laterality Date  . ABDOMINAL AORTIC ENDOVASCULAR STENT GRAFT N/A 09/14/2014   Procedure: ABDOMINAL AORTIC ENDOVASCULAR STENT GRAFT;  Surgeon: Conrad Shadeland, MD;  Location: [place];  Service: Vascular;  Laterality: N/A;  . CORONARY ARTERY BYPASS GRAFT  20+yrs ago   x 6  . ESOPHAGOGASTRODUODENOSCOPY    . HERNIA REPAIR Left    inguinal  . LIPOMA EXCISION     right lower back  . PARTIAL GASTRECTOMY     approx 1990  . TONSILLECTOMY     as a child  . TRANSURETHRAL RESECTION OF PROSTATE N/A 11/24/2015   Procedure: TRANSURETHRAL RESECTION OF THE PROSTATE (TURP);  Surgeon: Irine Seal, MD;  Location: [facility];  Service: Urology;  Laterality: N/A;     reports that he quit smoking about 17 months ago. His smoking use included Cigarettes. He has a 14.50 pack-year smoking history. He has never used smokeless tobacco. He reports that he does not drink alcohol or use drugs.  No Known Allergies  Family History  Problem Relation Age of Onset  . Hypertension  Brother   . Heart disease Brother     Heart Disease before age 56  . Heart attack Brother   . Cancer Brother   . Cancer Mother   . Diabetes Mother   . Heart disease Mother     Heart Disease before age 26  . Hypertension Mother   . Heart attack Mother   . Cancer Father     prostate  . Hypertension Father   . COPD Sister   . Diabetes Sister   . Heart disease Sister   . Hypertension Sister   . Heart attack Sister   . Colon cancer Neg Hx     Prior to Admission medications   Medication Sig Start Date End Date Taking? Authorizing Provider  albuterol (PROVENTIL HFA;VENTOLIN HFA) 108 (90 Base) MCG/ACT  inhaler Inhale 2 puffs into the lungs every 4 (four) hours as needed for wheezing or shortness of breath.   Yes Historical Provider, MD  albuterol (PROVENTIL) (2.5 MG/3ML) 0.083% nebulizer solution Take 3 mLs (2.5 mg total) by nebulization every 4 (four) hours as needed for wheezing or shortness of breath. 12/14/15  Yes [name], MD  aspirin EC 81 MG tablet Take 81 mg by mouth daily.    Yes Historical Provider, MD  atorvastatin (LIPITOR) 80 MG tablet Take 1 tablet (80 mg total) by mouth daily. 06/06/15  Yes [name], MD  benazepril (LOTENSIN) 5 MG tablet Take 1 tablet (5 mg total) by mouth daily. 02/08/16  Yes [name], MD  budesonide-formoterol (SYMBICORT) 160-4.5 MCG/ACT inhaler Inhale 2 puffs into the lungs 2 (two) times daily. 06/06/15  Yes [name], MD  clonazePAM (KLONOPIN) 0.5 MG tablet TAKE 1 TABLET BY MOUTH TWICE DAILY AS NEEDED FOR ANXIETY 03/05/16  Yes [name], MD  finasteride (PROSCAR) 5 MG tablet Take 5 mg by mouth daily.   Yes Historical Provider, MD  metoprolol succinate (TOPROL-XL) 25 MG 24 hr tablet TAKE 1/2 TABLET BY MOUTH 2 TIMES DAILY. 04/03/16  Yes [name], MD  potassium chloride SA (K-DUR,KLOR-CON) 20 MEQ tablet Take 1 tablet (20 mEq total) by mouth daily. 02/14/16  Yes [name], MD  tiotropium (SPIRIVA) 18 MCG inhalation capsule Place 18 mcg into inhaler and inhale daily.   Yes Historical Provider, MD  torsemide (DEMADEX) 20 MG tablet Take 2 tablets (40 mg total) by mouth daily. 03/19/16  Yes [name], MD  HYDROcodone-acetaminophen (NORCO) 10-325 MG tablet Take 0.5 tablets by mouth every 6 (six) hours as needed for severe pain. 01/13/16   [name], MD  nitroGLYCERIN (NITROSTAT) 0.4 MG SL tablet Place 1 tablet (0.4 mg total) under the tongue every 5 (five) minutes as needed for chest pain. 08/02/15   [name], MD    Physical Exam: Vitals:   04/04/16 1300 04/04/16 1330 04/04/16 1400 04/04/16 1535  BP: (!) 99/50 (!) 100/52  (!) 102/56   Pulse: 89 88 82 84  Resp: 18 (!) 0 18 (!) 21  Temp:      TempSrc:      SpO2: 96% 97% 98% 98%  Weight:      Height:          Constitutional: NAD, calm, comfortable, Cachectic Vitals:   04/04/16 1300 04/04/16 1330 04/04/16 1400 04/04/16 1535  BP: (!) 99/50 (!) 100/52 (!) 102/56   Pulse: 89 88 82 84  Resp: 18 (!) 0 18 (!) 21  Temp:      TempSrc:  SpO2: 96% 97% 98% 98%  Weight:      Height:       Eyes: PERRL, lids and conjunctivae normal ENMT:BiPAP mask in place  Neck: normal, supple, no masses, no thyromegaly Respiratory: Diminished breath sounds bilaterally. Normal work of breathing..  Cardiovascular: Regular rate and rhythm, no murmurs / rubs / gallops. No extremity edema. 2+ pedal pulses. No carotid bruits.  Abdomen: no tenderness, no masses palpated. No hepatosplenomegaly. Bowel sounds positive.  Musculoskeletal: no clubbing / cyanosis. No joint deformity upper and lower extremities. Good ROM, no contractures. Normal muscle tone.  Skin: no rashes, lesions, ulcers. No induration Neurologic: CN 2-12 grossly intact. Sensation intact, DTR normal. Strength 5/5 in all 4.  Psychiatric: Somnolent, briefly wakes up to voice but falls back asleep.    Labs on Admission: I have personally reviewed following labs and imaging studies  CBC:  Recent Labs Lab 04/04/16 0822  WBC 23.1*  NEUTROABS 22.2*  HGB 14.9  HCT 47.8  MCV 101.1*  PLT [id number]*   Basic Metabolic Panel:  Recent Labs Lab 04/04/16 0822  NA 141  K 4.2  CL 99*  CO2 35*  GLUCOSE 151*  BUN 22*  CREATININE 0.72  CALCIUM 8.8*   GFR: Estimated Creatinine Clearance: 64.7 mL/min (by C-G formula based on SCr of 0.72 mg/dL). Liver Function Tests: No results for input(s): AST, ALT, ALKPHOS, BILITOT, PROT, ALBUMIN in the last 168 hours. No results for input(s): LIPASE, AMYLASE in the last 168 hours. No results for input(s): AMMONIA in the last 168 hours. Coagulation Profile: No results for  input(s): INR, PROTIME in the last 168 hours. Cardiac Enzymes:  Recent Labs Lab 04/04/16 0822  TROPONINI 0.07*   BNP (last 3 results) No results for input(s): PROBNP in the last 8760 hours. HbA1C: No results for input(s): HGBA1C in the last 72 hours. CBG: No results for input(s): GLUCAP in the last 168 hours. Lipid Profile: No results for input(s): CHOL, HDL, LDLCALC, TRIG, CHOLHDL, LDLDIRECT in the last 72 hours. Thyroid Function Tests: No results for input(s): TSH, T4TOTAL, FREET4, T3FREE, THYROIDAB in the last 72 hours. Anemia Panel: No results for input(s): VITAMINB12, FOLATE, FERRITIN, TIBC, IRON, RETICCTPCT in the last 72 hours. Urine analysis:    Component Value Date/Time   COLORURINE YELLOW 04/04/2016 0838   APPEARANCEUR HAZY (A) 04/04/2016 0838   LABSPEC 1.020 04/04/2016 0838   PHURINE 5.5 04/04/2016 0838   GLUCOSEU NEGATIVE 04/04/2016 0838   HGBUR NEGATIVE 04/04/2016 0838   BILIRUBINUR NEGATIVE 04/04/2016 0838   KETONESUR NEGATIVE 04/04/2016 0838   PROTEINUR 30 (A) 04/04/2016 0838   UROBILINOGEN 1.0 09/10/2014 0919   NITRITE NEGATIVE 04/04/2016 0838   LEUKOCYTESUR NEGATIVE 04/04/2016 0838   Sepsis Labs: !!!!!!!!!!!!!!!!!!!!!!!!!!!!!!!!!!!!!!!!!!!! @LABRCNTIP (procalcitonin:4,lacticidven:4) ) Recent Results (from the past 240 hour(s))  MRSA PCR Screening     Status: None   Collection Time: 04/04/16 12:15 PM  Result Value Ref Range Status   MRSA by PCR NEGATIVE NEGATIVE Final    Comment:        The GeneXpert MRSA Assay (FDA approved for NASAL specimens only), is one component of a comprehensive MRSA colonization surveillance program. It is not intended to diagnose MRSA infection nor to guide or monitor treatment for MRSA infections.      Radiological Exams on Admission: Dg Chest Port 1 View  Result Date: 04/04/2016 CLINICAL DATA:  Shortness of Breath EXAM: PORTABLE CHEST 1 VIEW COMPARISON:  November 21, 2015 FINDINGS: There is widespread airspace  consolidation throughout most of the left  lung with the greatest concentration of infiltrate in the left mid and lower lung zone regions. There is mild patchy opacity in the lateral right base. There is a degree of underlying hyper expansion. Heart size is normal. Pulmonary vascular is within normal limits. Patient is status post coronary artery bypass grafting. There is atherosclerotic calcification in the aorta. No adenopathy. IMPRESSION: Extensive consolidation, felt to represent pneumonia throughout most of the left lung, with the greatest degree of consolidation in the left mid and lower lung zones. Small area of infiltrate lateral right base. Suspect a degree of underlying COPD with hyperexpansion. Stable cardiac silhouette. Aortic atherosclerosis. Followup PA and lateral chest radiographs recommended in 3-4 weeks following trial of antibiotic therapy to ensure resolution and exclude underlying malignancy. Electronically Signed   By: Lowella Grip III M.D.   On: 04/04/2016 08:41    EKG: Independently reviewed. EKG shows sinus  tachycardia.   Assessment/Plan Active Problems:   Essential hypertension   Anxiety   COPD with acute exacerbation (HCC)   Chronic pain syndrome   Acute on chronic respiratory failure with hypoxia (HCC)   Chronic systolic CHF (congestive heart failure) (HCC)   CAP (community acquired pneumonia)   Sepsis ([place])   Pneumonia  1. Sepsis secondary to community acquired pneumonia. Upon arrival patient was noted to be hypoxic, tachypneic, hypotensive, and tachycardic. Chest x-ray was notable for pneumonia. Blood cultures and sputum cultures have been ordered. We'll also request influenza panel. He was started on ceftriaxone, azithromycin and vancomycin. Lactic acid normal range. He is being bolused IV fluids per sepsis protocol. Continue on BiPAP, nebulizer treatment, IV antibiotic, steroids, IV fluids, and pulmonary hygiene.  2. Acute on chronic respiratory failure. Related  to pneumonia. Currently on BiPAP. Will check ABG since he is somnolent. 3. Elevated Troponin. Patient presented with an elevated troponin of 0.07. Likely demand ischemia. No complaints of chest pain. EDP reviewed EKG with cardiology who did not feel there was any acute ischemic changes.  4. CAD. Continue Lipitor, nitroglycerin, and metoprolol.  5. COPD exacerbation. Patient is dependent on 3 liters of supplemental oxygen, will continue. He was noted to be short of breath and wheezing on admission. Continue on bronchodilators. Add steroids..  6. Chronic systolic congestive heart failure, EF 20-25% in 04/2015. Compensated at this time. He is receiving IV fluids in the setting of sepsis. Monitor volume status closely.  7. HTN. Pressures are improving with IV fluids, but still low range of normal. Continue Lopressor, but hold remaining antihypertensives..  8. HLD. Continue statin.  9. Anxiety and Depression. Hold Klonopin for now since he is somnolent.  10. Tobacco abuse. Counseled on cessation.   11. Chronic pain syndrome. Hold oral pain medication since he is somnolent at this time.  DVT prophylaxis: Lovenox  Code Status: FULL  Family Communication: Daughter at bedside Disposition Plan: Discharge home once improved.  Consults called: None  Admission status: Inpatient   Kathie Dike, MD [facility] If 7PM-7AM, please contact night-coverage [email or web address] Password [facility]  04/04/2016, 3:49 PM

## 2016-04-04 NOTE — Consult Note (Signed)
Cardiology Consultation   Patient ID: Tommy Turner; [id number]; 1941-02-16   Admit date: 04/04/2016 Date of Consult: 04/04/2016  Referring MD:  Dr. Roderic Palau Cardiologist: Dr. Harl Bowie Consulting Cardiologist: Dr. Bronson Ing  Patient Care Team: [name], MD as PCP - General (Family Medicine) Arnoldo Lenis, MD as Consulting Physician (Cardiology)    Reason for Consultation: positive troponin, hypotension   History of Present Illness: Tommy Turner is a 75 y.o. male with a hx of CAD/ICM status post remote MI followed by CABG in 2000 no and not in our system. Echo 2011 LVEF 30-35%, echo 2015 EF 25-30% with multiple wall motion abnormalities, grade 1 DD, moderate RV dysfunction. Lexi scan 2005 large scar in the apex and inferior lateral wall no ischemia LVEF 24%. Last echo 2016 LVEF 20-25%, moderate LAE and moderate RV dysfunction. He underwent repair of AAA 09/2014 and has chronic urinary retention.   Last saw Dr. Harl Bowie 10/2015 at which time he was mildly orthostatic and  medications could not be titrated. Patient was not interested in an ICD. He was cleared for surgery but was felt to be at increased risk. Patient also has COPD, O2 dependent at 3 L, hypertension, HLD, and tobacco abuse.  Patient presents now with worsening shortness of breath without improvement from nebulizer. WBC is 23,000, troponin 0.07. In ER he was hypoxic, tachypneic, tachycardic and hypotensive. Was felt he had sepsis secondary to community-acquired pneumonia.    Most history was taken from his wife. Patient does state that he had chest pressure yesterday that lasted about 30 minutes. He did not tell his wife about this. He didn't take nitroglycerin. He denies any other chest pain before after this. He is very innactive and doesn't do much. He continues to smoke cigarettes. Has chronic dyspnea.    Past Medical History:  Diagnosis Date  . AAA (abdominal aortic aneurysm) ([place])   . Aneurysm ([place])   .  Anxiety    takes Xanax daily as needed  . CAD (coronary artery disease)   . CHF (congestive heart failure) ([place])   . COPD (chronic obstructive pulmonary disease) ([facility])    Albuterol inhaler and neb daily as needed;takes SPiriva daily  . Depression    takes Celexa daily  . Foley catheter in place   . History of shingles   . Hyperlipidemia    takes Atorvastatin daily  . Hypertension    takes Metoprolol and Benazepril  daily  . Myocardial infarction    20+yrs ago  . Oxygen dependent    2L/[place]   . Peripheral edema    takes Furosemide daily  . Shortness of breath     on oxygen  2 liters continuous  . Ulcer ([place])    gastric    Past Surgical History:  Procedure Laterality Date  . ABDOMINAL AORTIC ENDOVASCULAR STENT GRAFT N/A 09/14/2014   Procedure: ABDOMINAL AORTIC ENDOVASCULAR STENT GRAFT;  Surgeon: Conrad Cayuga, MD;  Location: [facility];  Service: Vascular;  Laterality: N/A;  . CORONARY ARTERY BYPASS GRAFT  20+yrs ago   x 6  . ESOPHAGOGASTRODUODENOSCOPY    . HERNIA REPAIR Left    inguinal  . LIPOMA EXCISION     right lower back  . PARTIAL GASTRECTOMY     approx 1990  . TONSILLECTOMY     as a child  . TRANSURETHRAL RESECTION OF PROSTATE N/A 11/24/2015   Procedure: TRANSURETHRAL RESECTION OF THE PROSTATE (TURP);  Surgeon: Irine Seal, MD;  Location: [facility];  Service: Urology;  Laterality: N/A;      Home Meds: Prior to Admission medications   Medication Sig Start Date End Date Taking? Authorizing Provider  albuterol (PROVENTIL HFA;VENTOLIN HFA) 108 (90 Base) MCG/ACT inhaler Inhale 2 puffs into the lungs every 4 (four) hours as needed for wheezing or shortness of breath.   Yes Historical Provider, MD  albuterol (PROVENTIL) (2.5 MG/3ML) 0.083% nebulizer solution Take 3 mLs (2.5 mg total) by nebulization every 4 (four) hours as needed for wheezing or shortness of breath. 12/14/15  Yes [name], MD  aspirin EC 81 MG tablet Take 81 mg by mouth daily.    Yes Historical Provider, MD    atorvastatin (LIPITOR) 80 MG tablet Take 1 tablet (80 mg total) by mouth daily. 06/06/15  Yes [name], MD  benazepril (LOTENSIN) 5 MG tablet Take 1 tablet (5 mg total) by mouth daily. 02/08/16  Yes [name], MD  budesonide-formoterol (SYMBICORT) 160-4.5 MCG/ACT inhaler Inhale 2 puffs into the lungs 2 (two) times daily. 06/06/15  Yes [name], MD  clonazePAM (KLONOPIN) 0.5 MG tablet TAKE 1 TABLET BY MOUTH TWICE DAILY AS NEEDED FOR ANXIETY 03/05/16  Yes [name], MD  finasteride (PROSCAR) 5 MG tablet Take 5 mg by mouth daily.   Yes Historical Provider, MD  metoprolol succinate (TOPROL-XL) 25 MG 24 hr tablet TAKE 1/2 TABLET BY MOUTH 2 TIMES DAILY. 04/03/16  Yes [name], MD  potassium chloride SA (K-DUR,KLOR-CON) 20 MEQ tablet Take 1 tablet (20 mEq total) by mouth daily. 02/14/16  Yes [name], MD  tiotropium (SPIRIVA) 18 MCG inhalation capsule Place 18 mcg into inhaler and inhale daily.   Yes Historical Provider, MD  torsemide (DEMADEX) 20 MG tablet Take 2 tablets (40 mg total) by mouth daily. 03/19/16  Yes [name], MD  HYDROcodone-acetaminophen (NORCO) 10-325 MG tablet Take 0.5 tablets by mouth every 6 (six) hours as needed for severe pain. 01/13/16   [name], MD  nitroGLYCERIN (NITROSTAT) 0.4 MG SL tablet Place 1 tablet (0.4 mg total) under the tongue every 5 (five) minutes as needed for chest pain. 08/02/15   [name], MD    Current Medications: . sodium chloride   Intravenous STAT  . azithromycin  500 mg Intravenous Q24H     Allergies:   No Known Allergies  Social History:   The patient  reports that he quit smoking about 17 months ago. His smoking use included Cigarettes. He has a 14.50 pack-year smoking history. He has never used smokeless tobacco. He reports that he does not drink alcohol or use drugs.    Family History:   The patient's family history includes COPD in his sister; Cancer in his brother, father, and mother; Diabetes in his  mother and sister; Heart attack in his brother, mother, and sister; Heart disease in his brother, mother, and sister; Hypertension in his brother, father, mother, and sister.   ROS:  Please see the history of present illness.  Review of Systems  Constitution: Positive for weakness and malaise/fatigue.  HENT: Positive for hearing loss.   Cardiovascular: Positive for chest pain and dyspnea on exertion.  Respiratory: Positive for cough and shortness of breath.   Endocrine: Negative.   Hematologic/Lymphatic: Negative.   Musculoskeletal: Negative.   Gastrointestinal: Negative.   Genitourinary: Negative.    All other ROS reviewed and negative.      Vital Signs: Blood pressure 96/61, pulse 84, temperature 98.3 F (36.8 C), temperature source Axillary,  resp. rate (!) 23, height 5\' 7"  (1.702 m), weight 128 lb 4.9 oz (58.2 kg), SpO2 97 %.   PHYSICAL EXAM: General:  Well nourished, well developed, in no acute distress  HEENT: normal Lymph: no adenopathy Neck: Slight increase JVD Endocrine:  No thryomegaly Vascular: No carotid bruits; FA pulses 2+ bilaterally without bruits  Cardiac:  RRR; normal S1, S2; distant heart sounds no murmur, rub, bruit, thrill, or heave Lungs:  clear to auscultation bilaterally, no wheezing, rhonchi or rales  Abd: soft, nontender, no hepatomegaly  Ext: no edema, Good distal pulses bilaterally Musculoskeletal:  No deformities, BUE and BLE strength normal and equal Skin: warm and dry  Neuro:  CNs 2-12 intact, no focal abnormalities noted Psych:  Normal affect    EKG:  Normal sinus rhythm with intraventricular conduction delay, no acute change from prior tracing  Telemetry: NSR  Labs:  Recent Labs  04/04/16 0822  TROPONINI 0.07*   Lab Results  Component Value Date   WBC 23.1 (H) 04/04/2016   HGB 14.9 04/04/2016   HCT 47.8 04/04/2016   MCV 101.1 (H) 04/04/2016   PLT 142 (L) 04/04/2016    Recent Labs Lab 04/04/16 0822  NA 141  K 4.2  CL 99*    CO2 35*  BUN 22*  CREATININE 0.72  CALCIUM 8.8*  GLUCOSE 151*   Lab Results  Component Value Date   CHOL 119 03/12/2014   HDL 55 03/12/2014   LDLCALC 54 03/12/2014   TRIG 52 03/12/2014   No results found for: DDIMER  Radiology/Studies:  Dg Chest Port 1 View  Result Date: 04/04/2016 CLINICAL DATA:  Shortness of Breath EXAM: PORTABLE CHEST 1 VIEW COMPARISON:  November 21, 2015 FINDINGS: There is widespread airspace consolidation throughout most of the left lung with the greatest concentration of infiltrate in the left mid and lower lung zone regions. There is mild patchy opacity in the lateral right base. There is a degree of underlying hyper expansion. Heart size is normal. Pulmonary vascular is within normal limits. Patient is status post coronary artery bypass grafting. There is atherosclerotic calcification in the aorta. No adenopathy. IMPRESSION: Extensive consolidation, felt to represent pneumonia throughout most of the left lung, with the greatest degree of consolidation in the left mid and lower lung zones. Small area of infiltrate lateral right base. Suspect a degree of underlying COPD with hyperexpansion. Stable cardiac silhouette. Aortic atherosclerosis. Followup PA and lateral chest radiographs recommended in 3-4 weeks following trial of antibiotic therapy to ensure resolution and exclude underlying malignancy. Electronically Signed   By: Lowella Grip III M.D.   On: 04/04/2016 08:41     PROBLEM LIST:  Active Problems:   CAP (community acquired pneumonia)     ASSESSMENT AND PLAN:  Sepsis secondary to CAP  Hypotension has been a chronic problem with difficulty titrating medications up. Was on Lotensin 5 mg once daily, Demadex 40 mg daily Toprol-XL 25 mg half tablet twice a day. All these are on hold. Would try to gradually restart as his blood pressure is at his baseline.  CAD status post CABG in 2000 now with borderline elevated troponin of 0.07 and 30 minutes of chest  pressure yesterday.EKG without acute change. Follow symptoms and troponins.  Ischemic cardiomyopathy LVEF 24%, patient medicines can't be titrated because of hypotension, has not been interested in ICD.  COPD O2 dependent on 3 L still smoking  History of hypertension but has been hypotensive recently HLD on statin  Anxiety depression  Signed, Ermalinda Barrios, PA-C  04/04/2016 12:48 PM   The patient was seen and examined, and I agree with the history, physical exam, assessment and plan as documented above, with modifications as noted below. Pt admitted with sepsis due to community acquired pneumonia. Troponin elevation consistent with demand ischemia due to sepsis. No evidence of decompensated heart failure at present. Continue to hold cardiac medications until clinical condition stabilizes.  Kate Sable, MD, [facility]  04/04/2016 2:39 PM

## 2016-04-04 NOTE — Progress Notes (Signed)
CRITICAL VALUE ALERT  Critical value received: ABG: PH 7.276, PCO2 69.5, PO2 82.9  Date of notification: 04/04/16  Time of notification: 2305  Critical value read back:yes  Nurse who received alert: Gerome Sam, RN  MD notified (1st page):  Mid-level K.Kirby  Time of first page: [phone]  MD notified (2nd page):  Time of second page:  Responding MD:    Time MD responded:

## 2016-04-04 NOTE — ED Notes (Signed)
4th liter hung with 553ml bolus then at 113ml/hr per vo dr Lacinda Axon

## 2016-04-04 NOTE — Progress Notes (Signed)
CRITICAL VALUE ALERT  Critical value received: ABG: PH 7.205, PCO2 84.5, PO2 79.9  Date of notification:  04/04/16  Time of notification:  1935  Critical value read back:yes  Nurse who received alert: Gerome Sam, RN  MD notified (1st page):  Mid-level K.Kirby  Time of first page: 1940  MD notified (2nd page):  Time of second page:  Responding MD: Mid-level K.Kirby  Time MD responded:  [phone]

## 2016-04-04 NOTE — Progress Notes (Signed)
Pharmacy Antibiotic Note  Tommy Turner is a 75 y.o. male admitted on 04/04/2016 with pneumonia.  Pharmacy has been consulted for Vancomycin dosing. Pt also on Azithromycin 500mg  IV q24h and Rocephin 1gm IV q24h  Plan: Vancomycin 1000mg  loading dose, then 750mg  IV every 12 hours.  Goal trough 15-20 mcg/mL.  F/U cultures and clinical progress Monitor labs and V/S, Vancomycin trough as indicated Deescalate tx as indicated Height: 5\' 7"  (170.2 cm) Weight: 128 lb 4.9 oz (58.2 kg) IBW/kg (Calculated) : 66.1  Temp (24hrs), Avg:100.6 F (38.1 C), Min:98.3 F (36.8 C), Max:102.4 F (39.1 C)   Recent Labs Lab 04/04/16 0822 04/04/16 1220  WBC 23.1*  --   CREATININE 0.72  --   LATICACIDVEN 1.6 1.2    Estimated Creatinine Clearance: 64.7 mL/min (by C-G formula based on SCr of 0.72 mg/dL).    No Known Allergies  Antimicrobials this admission: 11/29 Ceftriaxone >>  11/29 Azithromycin >> 11/29 Vancomycin >>   Dose adjustments this admission: n/a  Microbiology results: 11/29 BCx: pending 11/29 UCx: pending 11/29 MRSA PCR: negative  Thank you for allowing pharmacy to be a part of this patient's care.  Isac Sarna, BS [place], [place] Clinical Pharmacist Pager [phone]  04/04/2016 4:01 PM

## 2016-04-04 NOTE — ED Triage Notes (Signed)
Pt brought in by [facility] c/o SOB since last night. Pt is on home O2 and sats were 81% upon EMS arrival. EMS gave Albuterol neb and sats increased to 92%. HR 130-140 and pt wheezing for EMS. Hx COPD, Emphysema.

## 2016-04-04 NOTE — ED Notes (Signed)
Waiting RT for transfer

## 2016-04-04 NOTE — ED Notes (Addendum)
CRITICAL VALUE ALERT  Critical value received:  Troponin 0.07  Date of notification:  04/04/16  Time of notification:  [id number]  Critical value read back:Yes.    Nurse who received alert: Parthenia Ames  MD notified (1st page):  Dr. Lacinda Axon  Time of first page:  913  MD notified (2nd page):  Time of second page:  Responding MD:  Dr. Lacinda Axon  Time MD responded:  913

## 2016-04-04 NOTE — ED Notes (Signed)
Pt attempted to urinate, unable at this time. edp aware of vs.

## 2016-04-04 NOTE — ED Notes (Signed)
3rd ekg obtained

## 2016-04-04 NOTE — Progress Notes (Signed)
CRITICAL VALUE ALERT  Critical value received:  ABG PH 7.2, PCO2 86.2, PO2 71.1  Date of notification:  03/2916  Time of notification:  1730  Critical value read back:yes  Nurse who received alert:  E.Phynix Horton RN  MD notified (1st page):  Dr. Roderic Palau   Time of first page:  1735  MD notified (2nd page):  Time of second page:  Responding MD:  Dr. Roderic Palau     Time MD responded:  [phone]

## 2016-04-04 NOTE — ED Provider Notes (Signed)
[facility] DEPT Provider Note   CSN: [id number] Arrival date & time: 04/04/16  0754  By signing my name below, I, Higinio Plan, attest that this documentation has been prepared under the direction and in the presence of Nat Christen, MD . Electronically Signed: Higinio Plan, Scribe. 04/04/2016. 8:39 AM.  History   Chief Complaint Chief Complaint  Patient presents with  . Shortness of Breath   The history is provided by the EMS personnel and the spouse. No language interpreter was used.   LEVEL V CAVEAT and Limited ROS due to Urgent Need for Intervention   HPI Comments: Tommy Turner is a 75 y.o. male with PMHx of CHF, PE, COPD and emphysema, brought in by [facility] to the Emergency Department for an evaluation of gradually worsening, shortness of breath. Per wife, pt began experiencing acute dyspnea over the past 8-10 hours. She notes pt tried his nebulizer treatment and inhaler at home this morning with minimal relief. Per EMS, pt is on home O2 and his sats were 81% upon their arrival.   PCP: Dr. Wolfgang Phoenix   Past Medical History:  Diagnosis Date  . AAA (abdominal aortic aneurysm) ([place])   . Aneurysm ([place])   . Anxiety    takes Xanax daily as needed  . CAD (coronary artery disease)   . CHF (congestive heart failure) ([place])   . COPD (chronic obstructive pulmonary disease) (H[facility])    Albuterol inhaler and neb daily as needed;takes SPiriva daily  . Depression    takes Celexa daily  . Foley catheter in place   . History of shingles   . Hyperlipidemia    takes Atorvastatin daily  . Hypertension    takes Metoprolol and Benazepril  daily  . Myocardial infarction    20+yrs ago  . Oxygen dependent    2L/[place]   . Peripheral edema    takes Furosemide daily  . Shortness of breath     on oxygen  2 liters continuous  . Ulcer [facility])    gastric    Patient Active Problem List   Diagnosis Date Noted  . BPH (benign prostatic hypertrophy) with urinary retention 07/01/2015  . Hypotension  05/24/2015  . Systolic CHF, chronic ([place]) 05/24/2015  . Protein-calorie malnutrition, severe 05/04/2015  . Acute on chronic respiratory failure with hypoxia ([place]) 05/03/2015  . Chronic systolic CHF (congestive heart failure) ([place]) 05/03/2015  . Leg swelling- Bilateral leg / foot 10/21/2014  . Abdominal aortic aneurysm without rupture ([place]) 09/14/2014  . Dyspnea 06/03/2014  . Elevated PSA 03/09/2014  . Peripheral vascular disease, unspecified 01/22/2014  . Chronic pain syndrome 10/27/2013  . Hyperglycemia 04/09/2013  . Acute respiratory failure with hypoxia ([place]) 04/09/2013  . COPD with acute exacerbation ([place]) 04/05/2013  . COPD exacerbation ([place]) 04/05/2013  . Loss of weight 03/18/2013  . Early satiety 03/18/2013  . Dysphagia, unspecified(787.20) 03/18/2013  . COPD (chronic obstructive pulmonary disease) ([place]) 10/31/2012  . Hyperlipemia 10/31/2012  . CAD (coronary artery disease) 10/31/2012  . Anxiety 10/31/2012  . Abdominal aortic aneurysm ([place]) 02/02/2011  . TOBACCO ABUSE 09/20/2009  . PEPTIC ULCER DISEASE 09/20/2009  . Essential hypertension 09/19/2009  . BRONCHITIS, CHRONIC 09/19/2009    Past Surgical History:  Procedure Laterality Date  . ABDOMINAL AORTIC ENDOVASCULAR STENT GRAFT N/A 09/14/2014   Procedure: ABDOMINAL AORTIC ENDOVASCULAR STENT GRAFT;  Surgeon: Conrad [place], MD;  Location: [facility];  Service: Vascular;  Laterality: N/A;  . CORONARY ARTERY BYPASS GRAFT  20+yrs ago   x 6  .  ESOPHAGOGASTRODUODENOSCOPY    . HERNIA REPAIR Left    inguinal  . LIPOMA EXCISION     right lower back  . PARTIAL GASTRECTOMY     approx 1990  . TONSILLECTOMY     as a child  . TRANSURETHRAL RESECTION OF PROSTATE N/A 11/24/2015   Procedure: TRANSURETHRAL RESECTION OF THE PROSTATE (TURP);  Surgeon: Irine Seal, MD;  Location: [facility];  Service: Urology;  Laterality: N/A;       Home Medications    Prior to Admission medications   Medication Sig Start Date End Date Taking? Authorizing  Provider  albuterol (PROVENTIL HFA;VENTOLIN HFA) 108 (90 Base) MCG/ACT inhaler Inhale 2 puffs into the lungs every 4 (four) hours as needed for wheezing or shortness of breath.    Historical Provider, MD  albuterol (PROVENTIL) (2.5 MG/3ML) 0.083% nebulizer solution Take 3 mLs (2.5 mg total) by nebulization every 4 (four) hours as needed for wheezing or shortness of breath. 12/14/15   [name], MD  aspirin EC 81 MG tablet Take 81 mg by mouth daily.     Historical Provider, MD  atorvastatin (LIPITOR) 80 MG tablet Take 1 tablet (80 mg total) by mouth daily. 06/06/15   [name], MD  benazepril (LOTENSIN) 5 MG tablet Take 1 tablet (5 mg total) by mouth daily. 02/08/16   [name], MD  budesonide-formoterol (SYMBICORT) 160-4.5 MCG/ACT inhaler Inhale 2 puffs into the lungs 2 (two) times daily. 06/06/15   [name], MD  citalopram (CELEXA) 20 MG tablet Take 20 mg by mouth daily.    Historical Provider, MD  clonazePAM (KLONOPIN) 0.5 MG tablet TAKE 1 TABLET BY MOUTH TWICE DAILY AS NEEDED FOR ANXIETY 03/05/16   [name], MD  finasteride (PROSCAR) 5 MG tablet Take 5 mg by mouth daily.    Historical Provider, MD  HYDROcodone-acetaminophen (NORCO) 10-325 MG tablet Take 0.5 tablets by mouth every 6 (six) hours as needed for severe pain. 01/13/16   [name], MD  metoprolol succinate (TOPROL-XL) 25 MG 24 hr tablet TAKE 1/2 TABLET BY MOUTH 2 TIMES DAILY. 04/03/16   [name], MD  nitroGLYCERIN (NITROSTAT) 0.4 MG SL tablet Place 1 tablet (0.4 mg total) under the tongue every 5 (five) minutes as needed for chest pain. 08/02/15   [name], MD  potassium chloride SA (K-DUR,KLOR-CON) 20 MEQ tablet Take 1 tablet (20 mEq total) by mouth daily. 02/14/16   [name], MD  tiotropium (SPIRIVA) 18 MCG inhalation capsule Place 18 mcg into inhaler and inhale daily.    Historical Provider, MD  torsemide (DEMADEX) 20 MG tablet Take 2 tablets (40 mg total) by mouth daily. 03/19/16   [name], MD    Family History Family History  Problem Relation Age of Onset  . Hypertension Brother   . Heart disease Brother     Heart Disease before age 63  . Heart attack Brother   . Cancer Brother   . Cancer Mother   . Diabetes Mother   . Heart disease Mother     Heart Disease before age 74  . Hypertension Mother   . Heart attack Mother   . Cancer Father     prostate  . Hypertension Father   . COPD Sister   . Diabetes Sister   . Heart disease Sister   . Hypertension Sister   . Heart attack Sister   . Colon cancer Neg Hx     Social History Social  History  Substance Use Topics  . Smoking status: Former Smoker    Packs/day: 0.25    Years: 58.00    Types: Cigarettes    Quit date: 11/02/2014  . Smokeless tobacco: Never Used     Comment: vapor ciggs  . Alcohol use No     Allergies   Patient has no known allergies.   Review of Systems Review of Systems  Unable to perform ROS: Severe respiratory distress   Physical Exam Updated Vital Signs BP (!) 141/112   Pulse (!) 140   Resp (!) 28   SpO2 (!) 86%   Physical Exam  Constitutional: He is oriented to person, place, and time. He appears well-developed and well-nourished.  Extremely dyspneic and tachypnic. Thin body habitus.  HENT:  Head: Normocephalic and atraumatic.  Eyes: Conjunctivae are normal.  Neck: Neck supple.  Cardiovascular: Tachycardia present.   Tachycardic   Pulmonary/Chest: He has wheezes.  He had scattered wheezes and rhonchi throughout lung fields.   Abdominal: Soft. Bowel sounds are normal.  Musculoskeletal: Normal range of motion.  Neurological: He is alert and oriented to person, place, and time.  Skin: Skin is warm and dry.  Psychiatric: He has a normal mood and affect. His behavior is normal.  Nursing note and vitals reviewed.  ED Treatments / Results  Labs (all labs ordered are listed, but only abnormal results are displayed) Labs Reviewed  CBC WITH DIFFERENTIAL/PLATELET -  Abnormal; Notable for the following:       Result Value   WBC 23.1 (*)    MCV 101.1 (*)    Platelets 142 (*)    Neutro Abs 22.2 (*)    Lymphs Abs 0.4 (*)    All other components within normal limits  BASIC METABOLIC PANEL - Abnormal; Notable for the following:    Chloride 99 (*)    CO2 35 (*)    Glucose, Bld 151 (*)    BUN 22 (*)    Calcium 8.8 (*)    All other components within normal limits  TROPONIN I - Abnormal; Notable for the following:    Troponin I 0.07 (*)    All other components within normal limits  CULTURE, BLOOD (ROUTINE X 2)  CULTURE, BLOOD (ROUTINE X 2)  URINE CULTURE  LACTIC ACID, PLASMA  LACTIC ACID, PLASMA  URINALYSIS, ROUTINE W REFLEX MICROSCOPIC (NOT AT [facility])    EKG  EKG Interpretation None       Radiology Dg Chest Port 1 View  Result Date: 04/04/2016 CLINICAL DATA:  Shortness of Breath EXAM: PORTABLE CHEST 1 VIEW COMPARISON:  November 21, 2015 FINDINGS: There is widespread airspace consolidation throughout most of the left lung with the greatest concentration of infiltrate in the left mid and lower lung zone regions. There is mild patchy opacity in the lateral right base. There is a degree of underlying hyper expansion. Heart size is normal. Pulmonary vascular is within normal limits. Patient is status post coronary artery bypass grafting. There is atherosclerotic calcification in the aorta. No adenopathy. IMPRESSION: Extensive consolidation, felt to represent pneumonia throughout most of the left lung, with the greatest degree of consolidation in the left mid and lower lung zones. Small area of infiltrate lateral right base. Suspect a degree of underlying COPD with hyperexpansion. Stable cardiac silhouette. Aortic atherosclerosis. Followup PA and lateral chest radiographs recommended in 3-4 weeks following trial of antibiotic therapy to ensure resolution and exclude underlying malignancy. Electronically Signed   By: Lowella Grip III M.D.  On: 04/04/2016  08:41   Procedures Procedures (including critical care time)  Medications Ordered in ED Medications  azithromycin (ZITHROMAX) 500 mg in dextrose 5 % 250 mL IVPB (0 mg Intravenous Stopped 04/04/16 0951)  sodium chloride 0.9 % bolus 1,000 mL (1,000 mLs Intravenous New Bag/Given 04/04/16 0942)  sodium chloride 0.9 % bolus 1,000 mL (0 mLs Intravenous Stopped 04/04/16 0942)  methylPREDNISolone sodium succinate (SOLU-MEDROL) 125 mg/2 mL injection 125 mg (125 mg Intravenous Given 04/04/16 0830)  ipratropium-albuterol (DUONEB) 0.5-2.5 (3) MG/3ML nebulizer solution 3 mL (3 mLs Nebulization Given 04/04/16 0832)  cefTRIAXone (ROCEPHIN) 1 g in dextrose 5 % 50 mL IVPB (0 g Intravenous Stopped 04/04/16 0918)  acetaminophen (TYLENOL) suppository 650 mg (650 mg Rectal Given 04/04/16 0830)  sodium chloride 0.9 % bolus 1,000 mL (0 mLs Intravenous Stopped 04/04/16 0943)    DIAGNOSTIC STUDIES:  Oxygen Saturation is 86% on N/C, low by my interpretation.    COORDINATION OF CARE:  8:11 AM Discussed treatment plan with pt and his wife at bedside and they agreed to plan.  Initial Impression / Assessment and Plan / ED Course  I have reviewed the triage vital signs and the nursing notes.  Pertinent labs & imaging results that were available during my care of the patient were reviewed by me and considered in my medical decision making (see chart for details).  Clinical Course     Plan for code sepsis,  BIPAP, nebulizer treatment, antibiotics, steroids, IV fluids, CXR and blood cultures. Eventual admission to the hospital.   Recheck at 0945:  Patient blood pressure was initially soft, but is improving with hydration. He is no longer dyspneic nor tachypneic. IVFs, IV Rocephin and Zithromax  CRITICAL CARE Performed by: Nat Christen Total critical care time: 40 minutes Critical care time was exclusive of separately billable procedures and treating other patients. Critical care was necessary to treat or  prevent imminent or life-threatening deterioration. Critical care was time spent personally by me on the following activities: development of treatment plan with patient and/or surrogate as well as nursing, discussions with consultants, evaluation of patient's response to treatment, examination of patient, obtaining history from patient or surrogate, ordering and performing treatments and interventions, ordering and review of laboratory studies, ordering and review of radiographic studies, pulse oximetry and re-evaluation of patient's condition.  I personally performed the services described in this documentation, which was scribed in my presence. The recorded information has been reviewed and is accurate.    Final Clinical Impressions(s) / ED Diagnoses   Final diagnoses:  Community acquired pneumonia of left lung, unspecified part of lung    New Prescriptions New Prescriptions   No medications on file     Nat Christen, MD 04/04/16 1014

## 2016-04-04 NOTE — ED Notes (Signed)
Pt receiving meds and bipap started.

## 2016-04-05 DIAGNOSIS — R7989 Other specified abnormal findings of blood chemistry: Secondary | ICD-10-CM

## 2016-04-05 DIAGNOSIS — R778 Other specified abnormalities of plasma proteins: Secondary | ICD-10-CM

## 2016-04-05 DIAGNOSIS — D649 Anemia, unspecified: Secondary | ICD-10-CM

## 2016-04-05 DIAGNOSIS — J441 Chronic obstructive pulmonary disease with (acute) exacerbation: Secondary | ICD-10-CM

## 2016-04-05 DIAGNOSIS — I472 Ventricular tachycardia: Secondary | ICD-10-CM

## 2016-04-05 DIAGNOSIS — J9621 Acute and chronic respiratory failure with hypoxia: Secondary | ICD-10-CM

## 2016-04-05 DIAGNOSIS — D696 Thrombocytopenia, unspecified: Secondary | ICD-10-CM

## 2016-04-05 DIAGNOSIS — I5022 Chronic systolic (congestive) heart failure: Secondary | ICD-10-CM

## 2016-04-05 DIAGNOSIS — I9589 Other hypotension: Secondary | ICD-10-CM

## 2016-04-05 DIAGNOSIS — R509 Fever, unspecified: Secondary | ICD-10-CM

## 2016-04-05 LAB — CBC
HCT: 38.4 % — ABNORMAL LOW (ref 39.0–52.0)
HEMOGLOBIN: 11.7 g/dL — AB (ref 13.0–17.0)
MCH: 31.1 pg (ref 26.0–34.0)
MCHC: 30.5 g/dL (ref 30.0–36.0)
MCV: 102.1 fL — ABNORMAL HIGH (ref 78.0–100.0)
Platelets: 101 10*3/uL — ABNORMAL LOW (ref 150–400)
RBC: 3.76 MIL/uL — ABNORMAL LOW (ref 4.22–5.81)
RDW: 13.5 % (ref 11.5–15.5)
WBC: 19.3 10*3/uL — ABNORMAL HIGH (ref 4.0–10.5)

## 2016-04-05 LAB — BASIC METABOLIC PANEL
ANION GAP: 4 — AB (ref 5–15)
BUN: 17 mg/dL (ref 6–20)
CALCIUM: 8.6 mg/dL — AB (ref 8.9–10.3)
CO2: 32 mmol/L (ref 22–32)
Chloride: 105 mmol/L (ref 101–111)
Creatinine, Ser: 0.45 mg/dL — ABNORMAL LOW (ref 0.61–1.24)
GFR calc Af Amer: >60 mL/min (ref >60)
GFR calc non Af Amer: >60 mL/min (ref >60)
GLUCOSE: 119 mg/dL — AB (ref 65–99)
Potassium: 3.9 mmol/L (ref 3.5–5.1)
Sodium: 141 mmol/L (ref 135–145)

## 2016-04-05 LAB — MAGNESIUM: MAGNESIUM: 1.7 mg/dL (ref 1.7–2.4)

## 2016-04-05 LAB — HIV ANTIBODY (ROUTINE TESTING W REFLEX): HIV Screen 4th Generation wRfx: NONREACTIVE

## 2016-04-05 MED ORDER — ORAL CARE MOUTH RINSE
15.0000 mL | Freq: Two times a day (BID) | OROMUCOSAL | Status: DC
  Administered 2016-04-05 – 2016-04-17 (×18): 15 mL via OROMUCOSAL

## 2016-04-05 MED ORDER — HYDROCODONE-ACETAMINOPHEN 5-325 MG PO TABS
1.0000 | ORAL_TABLET | Freq: Four times a day (QID) | ORAL | Status: DC | PRN
  Administered 2016-04-06 – 2016-04-17 (×13): 1 via ORAL
  Filled 2016-04-05 (×13): qty 1

## 2016-04-05 MED ORDER — POTASSIUM CHLORIDE CRYS ER 10 MEQ PO TBCR
10.0000 meq | EXTENDED_RELEASE_TABLET | Freq: Once | ORAL | Status: AC
  Administered 2016-04-05: 10 meq via ORAL
  Filled 2016-04-05: qty 1

## 2016-04-05 MED ORDER — ENSURE ENLIVE PO LIQD
237.0000 mL | Freq: Three times a day (TID) | ORAL | Status: DC
  Administered 2016-04-05 – 2016-04-17 (×24): 237 mL via ORAL

## 2016-04-05 MED ORDER — CHLORHEXIDINE GLUCONATE 0.12 % MT SOLN
15.0000 mL | Freq: Two times a day (BID) | OROMUCOSAL | Status: DC
Start: 2016-04-05 — End: 2016-04-17
  Administered 2016-04-05 – 2016-04-17 (×22): 15 mL via OROMUCOSAL
  Filled 2016-04-05 (×21): qty 15

## 2016-04-05 MED ORDER — MAGNESIUM SULFATE 2 GM/50ML IV SOLN
2.0000 g | Freq: Once | INTRAVENOUS | Status: AC
  Administered 2016-04-05: 2 g via INTRAVENOUS
  Filled 2016-04-05: qty 50

## 2016-04-05 NOTE — Progress Notes (Signed)
Called and updated wife around 2300 on 04/04/16 per pt and family request. She wants to be kept updated throughout his length of stay.  Imberly Troxler Rica Mote, RN

## 2016-04-05 NOTE — Progress Notes (Addendum)
Initial Nutrition Assessment  DOCUMENTATION CODES:  Severe malnutrition in context of chronic illness   Pt meets criteria for SEVERE MALNUTRITION in the context of Chronic illness as evidenced by Severe muscle and fat wasting. -Believed that Malnutrition is longstanding  INTERVENTION:  -Ensure Enlive po TID, each supplement provides 350 kcal and 20 grams of protein  -Encouraged patient to start supplementing diet at home  NUTRITION DIAGNOSIS:  Increased nutrient needs related to Chronic catabolic illness (Severe COPD) and acute illness as evidenced by estimated nutritional requirements for this condition  GOAL:  Patient will meet greater than or equal to 90% of their needs  MONITOR:  PO intake, Supplement acceptance, Labs, Weight trends, I & O's  REASON FOR ASSESSMENT:  Malnutrition Screening Tool    ASSESSMENT:  75 y/o male PMHx AAA, Anxiety/Depression, CAD, CHF, COPD (Dependant on 3 L), HTN, HLD, MI, Tobacco abuse, chronic peripheral edema. Presented with SOB, fever, cough. Placed on Bipap. Met sepsis criteria thought to be related to CAP.   Pt is a somewhat hard to understand and a relatively poor historian. He reports the PTA he had a good appetite. He was able to eat multiple meals per day. His says he could not gain weight despite his good appetite. He did not take any vitamin/mineral supplements nor did he drink any protein supplements. RD strongly recommended he begin to consume these at home. Gave some names of products.   Denied N/V/C/D. Claims his bowels are regular  He does not know his UBW. Daughter in room states that on last PCP appointment he was 132 lbs. Per chart review, pts weight has been fairly stable for the last year, typically be 128-138 lbs. His weight is misleading due to presence of edema, he displays severe wasting.   Pt was agreeable to continuing Ensure supplement while admitted. Denied any food requests. Ate well this morning.   NFPE: Severe wasting  of temporalis and clavicular musculature. With moderate deltoid and gastrocnemius wasting. Displays severe fat wasting of thoracic cavity w/ ribs easily visible.   Classic case of chronic malnutrition due to catabolic copd and heightened energy expenditure due to WOB.    Meds: IV abx, ensure, Mag, Methylprednisolone Labs: WBC:19.3, Glucose 120-150.   Recent Labs Lab 04/04/16 0822 04/05/16 0403  NA 141 141  K 4.2 3.9  CL 99* 105  CO2 35* 32  BUN 22* 17  CREATININE 0.72 0.45*  CALCIUM 8.8* 8.6*  MG  --  1.7  GLUCOSE 151* 119*    Diet Order:  Diet Heart Room service appropriate? Yes; Fluid consistency: Thin  Skin:  Reviewed, no issues  Last BM:  11/28  Height:  Ht Readings from Last 1 Encounters:  04/04/16 '5\' 7"'  (1.702 m)   Weight:  Wt Readings from Last 1 Encounters:  04/05/16 138 lb 14.2 oz (63 kg)   Wt Readings from Last 10 Encounters:  04/05/16 138 lb 14.2 oz (63 kg)  02/14/16 128 lb (58.1 kg)  01/13/16 135 lb 6.4 oz (61.4 kg)  11/27/15 128 lb (58.1 kg)  11/24/15 132 lb (59.9 kg)  11/21/15 132 lb (59.9 kg)  11/18/15 130 lb (59 kg)  11/02/15 126 lb (57.2 kg)  09/08/15 133 lb (60.3 kg)  07/14/15 133 lb 6 oz (60.5 kg)  Dosing weight: 128 lbs Ideal Body Weight:  67.27 kg  BMI:  Body mass index is 21.75 kg/m.  Estimated Nutritional Needs:  Kcal:  2050-2200 kcals (35-38 g/kg bw) Protein:  87-100 g Pro (1.5-1.7 g/kg  bw) Fluid:  Per MD due to CHF  EDUCATION NEEDS:  No education needs identified at this time  Burtis Junes RD, LDN, [place] Clinical Nutrition Pager: [phone] 04/05/2016 10:17 AM

## 2016-04-05 NOTE — Progress Notes (Signed)
Patient Name: Tommy Turner Date of Encounter: 04/05/2016  Primary Cardiologist: [facility] Problem List     Principal Problem:   Sepsis [facility]) Active Problems:   Essential hypertension   CAD (coronary artery disease)   Anxiety   COPD with acute exacerbation (H[facility])   Chronic pain syndrome   Acute on chronic respiratory failure with hypoxia (HCC)   Chronic systolic CHF (congestive heart failure) (HCC)   Hypotension   CAP (community acquired pneumonia)   Anemia   Thrombocytopenia (H[facility])   Fever   Elevated troponin    Subjective   SOB improving. Wants to eat and drink - appetite better.  Inpatient Medications    . aspirin EC  81 mg Oral Daily  . atorvastatin  80 mg Oral Daily  . azithromycin  500 mg Intravenous Q24H  . cefTRIAXone (ROCEPHIN)  IV  1 g Intravenous Q24H  . chlorhexidine  15 mL Mouth Rinse BID  . enoxaparin (LOVENOX) injection  40 mg Subcutaneous Q24H  . feeding supplement (ENSURE ENLIVE)  237 mL Oral BID BM  . finasteride  5 mg Oral Daily  . guaiFENesin  1,200 mg Oral BID  . ipratropium-albuterol  3 mL Nebulization Q6H  . mouth rinse  15 mL Mouth Rinse q12n4p  . methylPREDNISolone (SOLU-MEDROL) injection  60 mg Intravenous Q6H  . mometasone-formoterol  2 puff Inhalation BID  . vancomycin  750 mg Intravenous Q12H    Vital Signs    Vitals:   04/05/16 0700 04/05/16 0707 04/05/16 0730 04/05/16 0800  BP: 97/63  (!) 97/51 (!) 96/57  Pulse: 73 82 74 76  Resp: (!) 21 (!) 21 13 (!) 21  Temp:  97.5 F (36.4 C)    TempSrc:  Axillary    SpO2: 95% 97% 97% 100%  Weight:      Height:        Intake/Output Summary (Last 24 hours) at 04/05/16 0826 Last data filed at 04/05/16 0500  Gross per 24 hour  Intake             3300 ml  Output              925 ml  Net             2375 ml   Filed Weights   04/04/16 1215 04/04/16 1227 04/05/16 0500  Weight: 128 lb 4.9 oz (58.2 kg) 128 lb 4.9 oz (58.2 kg) 138 lb 14.2 oz (63 kg)    Physical Exam      General: Frail chronically ill appearing WM in no acute distress. HEENT: Normocephalic, atraumatic, sclera non-icteric, no xanthomas, nares are without discharge. Neck: JVP not elevated. Lungs: + Barrel chested. Significantly diminished BS throughout without wheezes, rales or rhonchi.Breathing is unlabored. Cardiac: RRR S1 S2 without murmurs, rubs, or gallops.  Abdomen: Soft, non-tender, non-distended with normoactive bowel sounds. No rebound/guarding. Extremities: No clubbing or cyanosis. No edema. Distal pedal pulses are 2+ and equal bilaterally. Skin: Warm and dry, no significant rash. Neuro: Alert and oriented X 3. Sensation in tact. Follows commands. Psych:  Responds to questions appropriately with a normal affect.  Labs    CBC  Recent Labs  04/04/16 0822 04/05/16 0403  WBC 23.1* 19.3*  NEUTROABS 22.2*  --   HGB 14.9 11.7*  HCT 47.8 38.4*  MCV 101.1* 102.1*  PLT 142* [id number]*   Basic Metabolic Panel  Recent Labs  04/04/16 0822 04/05/16 0403  NA 141 141  K 4.2 3.9  CL 99*  105  CO2 35* 32  GLUCOSE 151* 119*  BUN 22* 17  CREATININE 0.72 0.45*  CALCIUM 8.8* 8.6*   Liver Function Tests No results for input(s): AST, ALT, ALKPHOS, BILITOT, PROT, ALBUMIN in the last 72 hours. No results for input(s): LIPASE, AMYLASE in the last 72 hours. Cardiac Enzymes  Recent Labs  04/04/16 0822  TROPONINI 0.07*    Telemetry    NSR occ PVCs, brief 6 beat NSVT, occ vent bigeminy bigeminy  Radiology    Dg Chest Port 1 View  Result Date: 04/04/2016 CLINICAL DATA:  Shortness of Breath EXAM: PORTABLE CHEST 1 VIEW COMPARISON:  November 21, 2015 FINDINGS: There is widespread airspace consolidation throughout most of the left lung with the greatest concentration of infiltrate in the left mid and lower lung zone regions. There is mild patchy opacity in the lateral right base. There is a degree of underlying hyper expansion. Heart size is normal. Pulmonary vascular is within normal limits.  Patient is status post coronary artery bypass grafting. There is atherosclerotic calcification in the aorta. No adenopathy. IMPRESSION: Extensive consolidation, felt to represent pneumonia throughout most of the left lung, with the greatest degree of consolidation in the left mid and lower lung zones. Small area of infiltrate lateral right base. Suspect a degree of underlying COPD with hyperexpansion. Stable cardiac silhouette. Aortic atherosclerosis. Followup PA and lateral chest radiographs recommended in 3-4 weeks following trial of antibiotic therapy to ensure resolution and exclude underlying malignancy. Electronically Signed   By: Lowella Grip III M.D.   On: 04/04/2016 08:41     Patient Profile     57M with CAD s/p MI/CABG [id number], chronic systolic CHF/ICM (pt disinterested in ICD), RV dysfunction as well, AAA repair 2016, chronic urinary retention, severe COPD with chronic respiratory failure on home O2, HTN (more recently with OP hypotension limiting medication therapy), HLD, tobacco abuse presented with a/c hypoxia, tachycardia, hypotension, fever, WBC 23k, felt to have sepsis related to CAP. Cardiology consulted for mildly elevated troponin and hypotension. Last echo 04/2015: EF 20-25% with multiple WMA, grade 1 DD, mod-sev LAE, mod reduced RVF, mod RAE.  Assessment & Plan    1. Sepsis felt due to CAP +/- AECOPD - per primary team. Now on antibiotics, steroids, nebs.  2. Hypotension - BP improving compared to yesterday (was 70s). Cardiac meds on hold due to this. See below.  3. Chronic systolic CHF - volume status appears stable. Weight trending upward but this may be due to fluid resuscitation. Clinically he is stable. Will need slow resumption of HF medications when BP allows. Note to IM: would consider capping IV fluids if appropriate to minimize chance of exacerbation.  4. Minimally elevated troponin (with history of known CAD and nonischemic nuc 2015) - per notes, felt c/w demand  ischemia due to sepsis. Continue ASA and statin. Can consider OP nuclear stress testing when patient improved from acute illness depending on symptoms.  5. Anemia/thrombocytopenia - likely decreased due to dilution. Follow.  6. PVCs/brief NSVT - will plan to resume BB as soon as blood pressure allows. Give 83meq KCl and follow lytes. Have added Mg to this AM's bloodwork.  Signed, Charlie Pitter, PA-C  04/05/2016, 8:26 AM   The patient was seen and examined, and I concur with the assessment and plan as stated above. Will resume cardiac meds when BP allows.

## 2016-04-05 NOTE — Progress Notes (Signed)
PROGRESS NOTE    Tommy Turner  [id number] DOB: January 02, 1941 DOA: 04/04/2016 PCP: Sallee Lange, MD    Brief Narrative:  75 y.o. male with medical history significant of AAA, anxiety, CAD, HTN, HLD, chronic systolic CHF EF 0000000, COPD oxygen dependent (2L), MI, and tobacco abuse presented with complaint of gradually worsening shortness of breath. On arrival, patient was hypoxic, febrile, tachycardic, tachypneic, hypotensive, and had a WBC of 23,000. While in the ED, patient was given IV fluids and antibiotics as well as Solu-Medrol. CXR was remarkable for pneumonia. Patient was also put on BiPAP. Patient was admitted for further treatment of sepsis and respiratory failure.   Assessment & Plan:   Active Problems:   Essential hypertension   Anxiety   COPD with acute exacerbation (HCC)   Chronic pain syndrome   Acute on chronic respiratory failure with hypoxia (HCC)   Chronic systolic CHF (congestive heart failure) ([place])   CAP (community acquired pneumonia)   Sepsis ([place])   Pneumonia  1. Sepsis secondary to community acquired pneumonia. Upon arrival patient was noted to be hypoxic, tachypneic, hypotensive, and tachycardic. Chest x-ray was notable for pneumonia. Blood cultures and sputum cultures have been ordered. Influenza panel was negative. He was started on ceftriaxone, azithromycin and vancomycin. MRSA PCR is negative, so will discontinue vancomycin. Lactic acid normal range. He was bolused IV fluids per sepsis protocol. Will hold off on further fluids, so as not to precipitate CHF exacerbation.   2. Acute on chronic respiratory failure. Related to pneumonia. Patient is chronically on 2L. He was placed on bipap on admission due to increased work of breathing and respiratory acidosis. PCO2 has improved since admission and overall patient is clinically better. Will try to wean down oxygen as tolerated. 3. Elevated Troponin. Patient presented with an elevated troponin of 0.07. Likely  demand ischemia. No complaints of chest pain. Cardiology following.  4. CAD. No complaints of chest pain. Continue Lipitor, nitroglycerin, and metoprolol.  5. COPD exacerbation. He was noted to be short of breath and wheezing on admission. Continue on bronchodilators, abx and steroids.  6. Chronic systolic congestive heart failure, EF 20-25% in 04/2015. Compensated at this time. BB and diuretics on hold due to low blood pressures.  7. HTN. Pressures are improving with IV fluids, but still low range of normal. Continue to hold antihypertensives for now.  8. HLD. Continue statin.  9. Anxiety and Depression. Stable.   10. Tobacco abuse. Counseled on cessation.   11. Chronic pain syndrome. Continue home pain regimen 12. Thrombocytopenia. Somewhat chronic, but likely lower due to acute infection. Continue to follow. No signs of bleeding at this time.  DVT prophylaxis: Lovenox Code Status: Full Family Communication: no family at bedside Disposition Plan: Discharge home once improved   Consultants:   Cardiology  Procedures:   BiPAP  Antimicrobials:   IV Zithromax 11/30 >> 12/7  IV Rocephin 11/30 >> 12/7  Vancomycin 11/29 >> 11/30   Subjective: Patient is more awake, feels better, shortness of breath improving. Has productive cough  Objective: Vitals:   04/05/16 0300 04/05/16 0341 04/05/16 0400 04/05/16 0500  BP: 94/61 98/76    Pulse: 72 71    Resp: 17 18    Temp:   97.9 F (36.6 C)   TempSrc:   Axillary   SpO2: 93% 95%    Weight:    63 kg (138 lb 14.2 oz)  Height:        Intake/Output Summary (Last 24 hours) at 04/05/16 0602  Last data filed at 04/05/16 0500  Gross per 24 hour  Intake             3300 ml  Output              925 ml  Net             2375 ml   Filed Weights   04/04/16 1215 04/04/16 1227 04/05/16 0500  Weight: 58.2 kg (128 lb 4.9 oz) 58.2 kg (128 lb 4.9 oz) 63 kg (138 lb 14.2 oz)    Examination:  General exam: Appears calm and comfortable    Respiratory system: crackles at bases. Respiratory effort normal. Cardiovascular system: S1 & S2 heard, RRR. No JVD, murmurs, rubs, gallops or clicks. trace pedal edema. Gastrointestinal system: Abdomen is nondistended, soft and nontender. No organomegaly or masses felt. Normal bowel sounds heard. Central nervous system: Alert and oriented. No focal neurological deficits. Extremities: Symmetric 5 x 5 power. Skin: No rashes, lesions or ulcers Psychiatry: Judgement and insight appear normal. Mood & affect appropriate.     Data Reviewed: I have personally reviewed following labs and imaging studies  CBC:  Recent Labs Lab 04/04/16 0822 04/05/16 0403  WBC 23.1* 19.3*  NEUTROABS 22.2*  --   HGB 14.9 11.7*  HCT 47.8 38.4*  MCV 101.1* 102.1*  PLT 142* 99991111*   Basic Metabolic Panel:  Recent Labs Lab 04/04/16 0822 04/05/16 0403  NA 141 141  K 4.2 3.9  CL 99* 105  CO2 35* 32  GLUCOSE 151* 119*  BUN 22* 17  CREATININE 0.72 0.45*  CALCIUM 8.8* 8.6*   GFR: Estimated Creatinine Clearance: 70 mL/min (by C-G formula based on SCr of 0.45 mg/dL (L)). Liver Function Tests: No results for input(s): AST, ALT, ALKPHOS, BILITOT, PROT, ALBUMIN in the last 168 hours. No results for input(s): LIPASE, AMYLASE in the last 168 hours. No results for input(s): AMMONIA in the last 168 hours. Coagulation Profile: No results for input(s): INR, PROTIME in the last 168 hours. Cardiac Enzymes:  Recent Labs Lab 04/04/16 0822  TROPONINI 0.07*   BNP (last 3 results) No results for input(s): PROBNP in the last 8760 hours. HbA1C: No results for input(s): HGBA1C in the last 72 hours. CBG: No results for input(s): GLUCAP in the last 168 hours. Lipid Profile: No results for input(s): CHOL, HDL, LDLCALC, TRIG, CHOLHDL, LDLDIRECT in the last 72 hours. Thyroid Function Tests: No results for input(s): TSH, T4TOTAL, FREET4, T3FREE, THYROIDAB in the last 72 hours. Anemia Panel: No results for  input(s): VITAMINB12, FOLATE, FERRITIN, TIBC, IRON, RETICCTPCT in the last 72 hours. Sepsis Labs:  Recent Labs Lab 04/04/16 [id number] 04/04/16 1220 04/04/16 1555 04/04/16 1602 04/04/16 1943  PROCALCITON  --   --  15.58  --   --   LATICACIDVEN 1.6 1.2  --  1.0 1.1    Recent Results (from the past 240 hour(s))  MRSA PCR Screening     Status: None   Collection Time: 04/04/16 12:15 PM  Result Value Ref Range Status   MRSA by PCR NEGATIVE NEGATIVE Final    Comment:        The GeneXpert MRSA Assay (FDA approved for NASAL specimens only), is one component of a comprehensive MRSA colonization surveillance program. It is not intended to diagnose MRSA infection nor to guide or monitor treatment for MRSA infections.          Radiology Studies: Dg Chest Port 1 View  Result Date: 04/04/2016 CLINICAL DATA:  Shortness  of Breath EXAM: PORTABLE CHEST 1 VIEW COMPARISON:  November 21, 2015 FINDINGS: There is widespread airspace consolidation throughout most of the left lung with the greatest concentration of infiltrate in the left mid and lower lung zone regions. There is mild patchy opacity in the lateral right base. There is a degree of underlying hyper expansion. Heart size is normal. Pulmonary vascular is within normal limits. Patient is status post coronary artery bypass grafting. There is atherosclerotic calcification in the aorta. No adenopathy. IMPRESSION: Extensive consolidation, felt to represent pneumonia throughout most of the left lung, with the greatest degree of consolidation in the left mid and lower lung zones. Small area of infiltrate lateral right base. Suspect a degree of underlying COPD with hyperexpansion. Stable cardiac silhouette. Aortic atherosclerosis. Followup PA and lateral chest radiographs recommended in 3-4 weeks following trial of antibiotic therapy to ensure resolution and exclude underlying malignancy. Electronically Signed   By: Lowella Grip III M.D.   On:  04/04/2016 08:41        Scheduled Meds: . aspirin EC  81 mg Oral Daily  . atorvastatin  80 mg Oral Daily  . azithromycin  500 mg Intravenous Q24H  . cefTRIAXone (ROCEPHIN)  IV  1 g Intravenous Q24H  . chlorhexidine  15 mL Mouth Rinse BID  . enoxaparin (LOVENOX) injection  40 mg Subcutaneous Q24H  . feeding supplement (ENSURE ENLIVE)  237 mL Oral BID BM  . finasteride  5 mg Oral Daily  . guaiFENesin  1,200 mg Oral BID  . ipratropium-albuterol  3 mL Nebulization Q6H  . mouth rinse  15 mL Mouth Rinse q12n4p  . methylPREDNISolone (SOLU-MEDROL) injection  60 mg Intravenous Q6H  . mometasone-formoterol  2 puff Inhalation BID  . vancomycin  750 mg Intravenous Q12H   Continuous Infusions: . sodium chloride 100 mL/hr at 04/05/16 0135     LOS: 1 day    Time spent: 25 minutes    Kathie Dike, MD [facility]   If 7PM-7AM, please contact night-coverage [email or web address] Password [email or web address] 04/05/2016, 6:02 AM

## 2016-04-05 NOTE — Progress Notes (Signed)
Mg 1.7. Will rx 2 g Mg sulfate. Kella Splinter PA-C

## 2016-04-06 DIAGNOSIS — F419 Anxiety disorder, unspecified: Secondary | ICD-10-CM

## 2016-04-06 LAB — BASIC METABOLIC PANEL
Anion gap: 4 — ABNORMAL LOW (ref 5–15)
BUN: 16 mg/dL (ref 6–20)
CALCIUM: 9.2 mg/dL (ref 8.9–10.3)
CO2: 34 mmol/L — ABNORMAL HIGH (ref 22–32)
CREATININE: 0.51 mg/dL — AB (ref 0.61–1.24)
Chloride: 101 mmol/L (ref 101–111)
GFR calc non Af Amer: >60 mL/min (ref >60)
Glucose, Bld: 163 mg/dL — ABNORMAL HIGH (ref 65–99)
Potassium: 4.1 mmol/L (ref 3.5–5.1)
SODIUM: 139 mmol/L (ref 135–145)

## 2016-04-06 LAB — LEGIONELLA PNEUMOPHILA SEROGP 1 UR AG: L. PNEUMOPHILA SEROGP 1 UR AG: NEGATIVE

## 2016-04-06 LAB — EXPECTORATED SPUTUM ASSESSMENT W REFEX TO RESP CULTURE

## 2016-04-06 LAB — CBC
HCT: 35.9 % — ABNORMAL LOW (ref 39.0–52.0)
Hemoglobin: 11.2 g/dL — ABNORMAL LOW (ref 13.0–17.0)
MCH: 31.5 pg (ref 26.0–34.0)
MCHC: 31.2 g/dL (ref 30.0–36.0)
MCV: 100.8 fL — ABNORMAL HIGH (ref 78.0–100.0)
PLATELETS: 114 10*3/uL — AB (ref 150–400)
RBC: 3.56 MIL/uL — AB (ref 4.22–5.81)
RDW: 13.3 % (ref 11.5–15.5)
WBC: 16.3 10*3/uL — AB (ref 4.0–10.5)

## 2016-04-06 LAB — MAGNESIUM: MAGNESIUM: 2.2 mg/dL (ref 1.7–2.4)

## 2016-04-06 LAB — URINE CULTURE

## 2016-04-06 LAB — EXPECTORATED SPUTUM ASSESSMENT W GRAM STAIN, RFLX TO RESP C

## 2016-04-06 NOTE — Progress Notes (Signed)
Patient Name: Tommy Turner Date of Encounter: 04/06/2016  Primary Cardiologist: [facility] Problem List     Principal Problem:   Sepsis [facility]) Active Problems:   Essential hypertension   CAD (coronary artery disease)   Anxiety   COPD with acute exacerbation (HCC)   Chronic pain syndrome   Acute on chronic respiratory failure with hypoxia (HCC)   Chronic systolic CHF (congestive heart failure) (HCC)   Hypotension   CAP (community acquired pneumonia)   Anemia   Thrombocytopenia (HCC)   Fever   Elevated troponin    Subjective   Feeling better, no complaints of significant dyspnea, no pain. Hungry and thirsty   Inpatient Medications    . aspirin EC  81 mg Oral Daily  . atorvastatin  80 mg Oral Daily  . azithromycin  500 mg Intravenous Q24H  . cefTRIAXone (ROCEPHIN)  IV  1 g Intravenous Q24H  . chlorhexidine  15 mL Mouth Rinse BID  . enoxaparin (LOVENOX) injection  40 mg Subcutaneous Q24H  . feeding supplement (ENSURE ENLIVE)  237 mL Oral TID BM  . finasteride  5 mg Oral Daily  . guaiFENesin  1,200 mg Oral BID  . ipratropium-albuterol  3 mL Nebulization Q6H  . mouth rinse  15 mL Mouth Rinse q12n4p  . methylPREDNISolone (SOLU-MEDROL) injection  60 mg Intravenous Q6H  . mometasone-formoterol  2 puff Inhalation BID    Vital Signs    Vitals:   04/06/16 0028 04/06/16 0203 04/06/16 0400 04/06/16 0453  BP: (!) 90/42     Pulse: 82     Resp: (!) 29     Temp:   98 F (36.7 C)   TempSrc:   Axillary   SpO2: 98% 100%    Weight:    151 lb 0.2 oz (68.5 kg)  Height:        Intake/Output Summary (Last 24 hours) at 04/06/16 0722 Last data filed at 04/06/16 0453  Gross per 24 hour  Intake              960 ml  Output             1600 ml  Net             -640 ml   Filed Weights   04/04/16 1227 04/05/16 0500 04/06/16 0453  Weight: 128 lb 4.9 oz (58.2 kg) 138 lb 14.2 oz (63 kg) 151 lb 0.2 oz (68.5 kg)    Physical Exam    General: Frail chronically ill  appearing WM in no acute distress. HEENT: Normocephalic, atraumatic, sclera non-icteric, no xanthomas, nares are without discharge. Neck: JVP not elevated. Lungs: + Barrel chested. Significantly diminished BS throughout without wheezes, rales or rhonchi.Breathing is unlabored. Cardiac: RRR S1 S2 without murmurs, rubs, or gallops.  Abdomen: Soft, non-tender, non-distended with normoactive bowel sounds. No rebound/guarding. Extremities: No clubbing or cyanosis. No edema. Distal pedal pulses are 2+ and equal bilaterally. Skin: Warm and dry, no significant rash. Neuro: Alert and oriented X 3. Sensation in tact. Follows commands. Psych:  Responds to questions appropriately with a normal affect.  Labs    CBC  Recent Labs  04/04/16 0822 04/05/16 0403 04/06/16 0359  WBC 23.1* 19.3* 16.3*  NEUTROABS 22.2*  --   --   HGB 14.9 11.7* 11.2*  HCT 47.8 38.4* 35.9*  MCV 101.1* 102.1* 100.8*  PLT 142* 101* 99991111*   Basic Metabolic Panel  Recent Labs  04/05/16 0403 04/06/16 0359  NA 141 139  K 3.9 4.1  CL 105 101  CO2 32 34*  GLUCOSE 119* 163*  BUN 17 16  CREATININE 0.45* 0.51*  CALCIUM 8.6* 9.2  MG 1.7 2.2   Liver Function Tests No results for input(s): AST, ALT, ALKPHOS, BILITOT, PROT, ALBUMIN in the last 72 hours. No results for input(s): LIPASE, AMYLASE in the last 72 hours. Cardiac Enzymes  Recent Labs  04/04/16 0822  TROPONINI 0.07*    Telemetry    NSR occ PVCs, brief 6 beat NSVT, occ vent bigeminy bigeminy  Radiology    Dg Chest Port 1 View  Result Date: 04/04/2016 CLINICAL DATA:  Shortness of Breath EXAM: PORTABLE CHEST 1 VIEW COMPARISON:  November 21, 2015 FINDINGS: There is widespread airspace consolidation throughout most of the left lung with the greatest concentration of infiltrate in the left mid and lower lung zone regions. There is mild patchy opacity in the lateral right base. There is a degree of underlying hyper expansion. Heart size is normal. Pulmonary  vascular is within normal limits. Patient is status post coronary artery bypass grafting. There is atherosclerotic calcification in the aorta. No adenopathy. IMPRESSION: Extensive consolidation, felt to represent pneumonia throughout most of the left lung, with the greatest degree of consolidation in the left mid and lower lung zones. Small area of infiltrate lateral right base. Suspect a degree of underlying COPD with hyperexpansion. Stable cardiac silhouette. Aortic atherosclerosis. Followup PA and lateral chest radiographs recommended in 3-4 weeks following trial of antibiotic therapy to ensure resolution and exclude underlying malignancy. Electronically Signed   By: Lowella Grip III M.D.   On: 04/04/2016 08:41     Patient Profile     36M with CAD s/p MI/CABG [id number], chronic systolic CHF/ICM (pt disinterested in ICD), RV dysfunction as well, AAA repair 2016, chronic urinary retention, severe COPD with chronic respiratory failure on home O2, HTN (more recently with OP hypotension limiting medication therapy), HLD, tobacco abuse presented with a/c hypoxia, tachycardia, hypotension, fever, WBC 23k, felt to have sepsis related to CAP. Cardiology consulted for mildly elevated troponin and hypotension. Last echo 04/2015: EF 20-25% with multiple WMA, grade 1 DD, mod-sev LAE, mod reduced RVF, mod RAE.  Assessment & Plan    1. Sepsis felt due to CAP +/- AECOPD - per primary team. Now on antibiotics, steroids, nebs.  2. Hypotension - Some improvement but still holding off of cardiac medications until BP is more improved. Ok to have BP in the 90's with significantly reduced EF. Current BP is related to sepsis.   3. Chronic systolic CHF - Stable. No evidence of decompensation at this mornings assessment. No yet on cardiac medications. Would start with low dose coreg when able to accept medications. Wt unchanged, stable.   4. Minimally elevated troponin (with history of known CAD and nonischemic nuc 2015) -  per notes, felt c/w demand ischemia due to sepsis. Continue ASA and statin. Can consider OP nuclear stress testing when patient improved from acute illness depending on symptoms.  5. Anemia/thrombocytopenia - Platelets improved this am. H&H stable.   6. PVCs/brief NSVT: Magnesium low yesterday. Repleted with IV bolus. Continues intermittent ventricular ectopy.  Signed, Jory Sims, NP  04/06/2016, 7:22 AM   The patient was seen and examined, and I concur with the assessment and plan as stated above. Denies chest pain. Says breathing has improved. Will resume cardiac meds when BP allows.

## 2016-04-06 NOTE — Care Management Note (Signed)
Case Management Note  Patient Details  Name: Tommy Turner MRN: [id number] Date of Birth: Oct 04, 1940  Subjective/Objective:                  Pt is from home, admitted with sepsis. He lives with wife who at the bedside. He is ind with ADL's. He has PCP but relys on family for transportation. He has oxygen and neb machine PTA. He has cane and walker he uses for ambulation. Pt has used [facility] in the past and will like to use them again if necessary.   Action/Plan: Per wife pt plans on return home. Pt will need PT eval prior to DC. CM will cont to follow.   Expected Discharge Date:     04/13/2016            Expected Discharge Plan:  Home/Self Care  In-House Referral:  NA  Discharge planning Services  CM Consult  Post Acute Care Choice:  NA Choice offered to:  NA  Status of Service:  In process, will continue to follow  Sherald Barge, RN 04/06/2016, 1:07 PM

## 2016-04-06 NOTE — Care Management Important Message (Signed)
Important Message  Patient Details  Name: Tommy Turner MRN: [id number] Date of Birth: 11-18-1940   Medicare Important Message Given:  Yes    Sherald Barge, RN 04/06/2016, 1:09 PM

## 2016-04-06 NOTE — Progress Notes (Signed)
Patient's O2 SAT dropped to 80% on 6L [place], BiPAP reapplied and O2 SAT now 91% on BiPAP. Abigail Butts, RT called and made aware.

## 2016-04-06 NOTE — Progress Notes (Signed)
1105 12 lead EKG results revealed LEFT axis deviation and LEFT BBB. MD notified.

## 2016-04-06 NOTE — Progress Notes (Signed)
1004 Patient noted to have a change in rhythm and is sustaining in ventricular bigeminy. Patient is asymptomatic at this time. MD notified.

## 2016-04-06 NOTE — Progress Notes (Signed)
PROGRESS NOTE    Tommy Turner  [id number] DOB: 01/07/1941 DOA: 04/04/2016 PCP: Sallee Lange, MD    Brief Narrative:  75 y.o. male with medical history significant of AAA, anxiety, CAD, HTN, HLD, chronic systolic CHF EF 0000000, COPD oxygen dependent (2L), MI, and tobacco abuse presented with complaint of gradually worsening shortness of breath. On arrival, patient was hypoxic, febrile, tachycardic, tachypneic, hypotensive, and had a WBC of 23,000. While in the ED, patient was given IV fluids and antibiotics as well as Solu-Medrol. CXR was remarkable for pneumonia. Patient was also put on BiPAP. Patient was admitted for further treatment of sepsis and respiratory failure.   Assessment & Plan:   Principal Problem:   Sepsis ([place]) Active Problems:   Essential hypertension   CAD (coronary artery disease)   Anxiety   COPD with acute exacerbation (HCC)   Chronic pain syndrome   Acute on chronic respiratory failure with hypoxia (HCC)   Chronic systolic CHF (congestive heart failure) (HCC)   Hypotension   CAP (community acquired pneumonia)   Anemia   Thrombocytopenia (HCC)   Fever   Elevated troponin  1. Sepsis secondary to community acquired pneumonia. Upon arrival patient was noted to be hypoxic, tachypneic, hypotensive, and tachycardic. Chest x-ray was notable for pneumonia. Blood cultures and sputum cultures have been ordered. Influenza panel was negative. He was started on ceftriaxone, azithromycin and vancomycin. MRSA PCR is negative, so vancomycin discontinued. Lactic acid normal range. He was bolused IV fluids per sepsis protocol. Will hold off on further fluids, so as not to precipitate CHF exacerbation.   2. Acute on chronic respiratory failure. Related to pneumonia. Patient is chronically on 2L. He was placed on bipap on admission due to increased work of breathing and respiratory acidosis. PCO2 has improved since admission and overall patient is clinically better. When taken  off bipap, he desaturates into the 80s. Will consult pulmonology for further assistance. 3. Elevated Troponin. Patient presented with an elevated troponin of 0.07. Likely demand ischemia. No complaints of chest pain. Cardiology following.  4. CAD. No complaints of chest pain. Continue Lipitor, nitroglycerin, and metoprolol.  5. COPD exacerbation. He was noted to be short of breath and wheezing on admission. Continue on bronchodilators, abx and steroids.  6. Chronic systolic congestive heart failure, EF 20-25% in 04/2015. Compensated at this time. BB and diuretics on hold due to low blood pressures.  7. HTN. Pressures are improving with IV fluids, but still low range of normal. Continue to hold antihypertensives for now.  8. HLD. Continue statin.  9. Anxiety and Depression. Stable.   10. Tobacco abuse. Counseled on cessation.   11. Chronic pain syndrome. Continue home pain regimen 12. Thrombocytopenia. Somewhat chronic, but likely lower due to acute infection. Continue to follow. No signs of bleeding at this time.  DVT prophylaxis: Lovenox Code Status: Full Family Communication: no family at bedside Disposition Plan: Discharge home once improved   Consultants:   Cardiology  Procedures:   BiPAP  Antimicrobials:   IV Zithromax 11/30 >> 12/7  IV Rocephin 11/30 >> 12/7  Vancomycin 11/29 >> 11/30   Subjective: Patient is feeling better. Feels shortness of breath is improving. Wheezing improving.  Objective: Vitals:   04/06/16 0735 04/06/16 0800 04/06/16 0819 04/06/16 0912  BP:      Pulse:    79  Resp:    16  Temp:  98.3 F (36.8 C)    TempSrc:  Oral    SpO2:   94% 96%  Weight: 63.5 kg (139 lb 15.9 oz)     Height:        Intake/Output Summary (Last 24 hours) at 04/06/16 0923 Last data filed at 04/06/16 0850  Gross per 24 hour  Intake              720 ml  Output             1825 ml  Net            -1105 ml   Filed Weights   04/05/16 0500 04/06/16 0453 04/06/16 0735   Weight: 63 kg (138 lb 14.2 oz) 68.5 kg (151 lb 0.2 oz) 63.5 kg (139 lb 15.9 oz)    Examination:  General exam: Appears calm and comfortable  Respiratory system: diminished breath sounds at bases. Respiratory effort normal. Cardiovascular system: S1 & S2 heard, RRR. No JVD, murmurs, rubs, gallops or clicks. trace pedal edema. Gastrointestinal system: Abdomen is nondistended, soft and nontender. No organomegaly or masses felt. Normal bowel sounds heard. Central nervous system: Alert and oriented. No focal neurological deficits. Extremities: Symmetric 5 x 5 power. Skin: No rashes, lesions or ulcers Psychiatry: Judgement and insight appear normal. Mood & affect appropriate.     Data Reviewed: I have personally reviewed following labs and imaging studies  CBC:  Recent Labs Lab 04/04/16 0822 04/05/16 0403 04/06/16 0359  WBC 23.1* 19.3* 16.3*  NEUTROABS 22.2*  --   --   HGB 14.9 11.7* 11.2*  HCT 47.8 38.4* 35.9*  MCV 101.1* 102.1* 100.8*  PLT 142* 101* [id number]*   Basic Metabolic Panel:  Recent Labs Lab 04/04/16 0822 04/05/16 0403 04/06/16 0359  NA 141 141 139  K 4.2 3.9 4.1  CL 99* 105 101  CO2 35* 32 34*  GLUCOSE 151* 119* 163*  BUN 22* 17 16  CREATININE 0.72 0.45* 0.51*  CALCIUM 8.8* 8.6* 9.2  MG  --  1.7 2.2   GFR: Estimated Creatinine Clearance: 70.6 mL/min (by C-G formula based on SCr of 0.51 mg/dL (L)). Liver Function Tests: No results for input(s): AST, ALT, ALKPHOS, BILITOT, PROT, ALBUMIN in the last 168 hours. No results for input(s): LIPASE, AMYLASE in the last 168 hours. No results for input(s): AMMONIA in the last 168 hours. Coagulation Profile: No results for input(s): INR, PROTIME in the last 168 hours. Cardiac Enzymes:  Recent Labs Lab 04/04/16 0822  TROPONINI 0.07*   BNP (last 3 results) No results for input(s): PROBNP in the last 8760 hours. HbA1C: No results for input(s): HGBA1C in the last 72 hours. CBG: No results for input(s): GLUCAP in  the last 168 hours. Lipid Profile: No results for input(s): CHOL, HDL, LDLCALC, TRIG, CHOLHDL, LDLDIRECT in the last 72 hours. Thyroid Function Tests: No results for input(s): TSH, T4TOTAL, FREET4, T3FREE, THYROIDAB in the last 72 hours. Anemia Panel: No results for input(s): VITAMINB12, FOLATE, FERRITIN, TIBC, IRON, RETICCTPCT in the last 72 hours. Sepsis Labs:  Recent Labs Lab 04/04/16 [id number] 04/04/16 1220 04/04/16 1555 04/04/16 1602 04/04/16 1943  PROCALCITON  --   --  15.58  --   --   LATICACIDVEN 1.6 1.2  --  1.0 1.1    Recent Results (from the past 240 hour(s))  Blood culture (routine x 2)     Status: None (Preliminary result)   Collection Time: 04/04/16  8:32 AM  Result Value Ref Range Status   Specimen Description BLOOD LEFT HAND  Final   Special Requests   Final    BOTTLES DRAWN  AEROBIC AND ANAEROBIC AEB=10CC ANA=8CC   Culture NO GROWTH 1 DAY  Final   Report Status PENDING  Incomplete  Blood culture (routine x 2)     Status: None (Preliminary result)   Collection Time: 04/04/16  8:37 AM  Result Value Ref Range Status   Specimen Description BLOOD RIGHT HAND  Final   Special Requests BOTTLES DRAWN AEROBIC AND ANAEROBIC 10CC ANA=8CC  Final   Culture NO GROWTH 1 DAY  Final   Report Status PENDING  Incomplete  Urine culture     Status: Abnormal   Collection Time: 04/04/16 11:08 AM  Result Value Ref Range Status   Specimen Description URINE, CLEAN CATCH  Final   Special Requests NONE  Final   Culture MULTIPLE SPECIES PRESENT, SUGGEST RECOLLECTION (A)  Final   Report Status 04/06/2016 FINAL  Final  MRSA PCR Screening     Status: None   Collection Time: 04/04/16 12:15 PM  Result Value Ref Range Status   MRSA by PCR NEGATIVE NEGATIVE Final    Comment:        The GeneXpert MRSA Assay (FDA approved for NASAL specimens only), is one component of a comprehensive MRSA colonization surveillance program. It is not intended to diagnose MRSA infection nor to guide  or monitor treatment for MRSA infections.          Radiology Studies: No results found.      Scheduled Meds: . aspirin EC  81 mg Oral Daily  . atorvastatin  80 mg Oral Daily  . azithromycin  500 mg Intravenous Q24H  . cefTRIAXone (ROCEPHIN)  IV  1 g Intravenous Q24H  . chlorhexidine  15 mL Mouth Rinse BID  . enoxaparin (LOVENOX) injection  40 mg Subcutaneous Q24H  . feeding supplement (ENSURE ENLIVE)  237 mL Oral TID BM  . finasteride  5 mg Oral Daily  . guaiFENesin  1,200 mg Oral BID  . ipratropium-albuterol  3 mL Nebulization Q6H  . mouth rinse  15 mL Mouth Rinse q12n4p  . methylPREDNISolone (SOLU-MEDROL) injection  60 mg Intravenous Q6H  . mometasone-formoterol  2 puff Inhalation BID   Continuous Infusions:    LOS: 2 days    Time spent: 25 minutes    Kathie Dike, MD [facility]   If 7PM-7AM, please contact night-coverage [email or web address] Password [facility] 04/06/2016, 9:23 AM

## 2016-04-06 NOTE — Progress Notes (Signed)
**Note De-Identified Tommy Turner Obfuscation** RN called that patients SAT 82% of HFNC.  Patient placed back onto BIPAP; tolerating well.  RRT to continue to monitor.

## 2016-04-07 ENCOUNTER — Other Ambulatory Visit: Payer: Self-pay

## 2016-04-07 DIAGNOSIS — G9340 Encephalopathy, unspecified: Secondary | ICD-10-CM | POA: Diagnosis present

## 2016-04-07 LAB — BLOOD GAS, ARTERIAL
ACID-BASE EXCESS: 10.9 mmol/L — AB (ref 0.0–2.0)
Bicarbonate: 33.5 mmol/L — ABNORMAL HIGH (ref 20.0–28.0)
DRAWN BY: 317771
Delivery systems: POSITIVE
Expiratory PAP: 6
FIO2: 0.45
Inspiratory PAP: 16
O2 Content: 45 L/min
O2 SAT: 95.3 %
PCO2 ART: 60.9 mmHg — AB (ref 32.0–48.0)
PH ART: 7.392 (ref 7.350–7.450)
PO2 ART: 86.2 mmHg (ref 83.0–108.0)
RATE: 12 resp/min

## 2016-04-07 LAB — CBC
HEMATOCRIT: 37.5 % — AB (ref 39.0–52.0)
Hemoglobin: 11.5 g/dL — ABNORMAL LOW (ref 13.0–17.0)
MCH: 30.7 pg (ref 26.0–34.0)
MCHC: 30.7 g/dL (ref 30.0–36.0)
MCV: 100 fL (ref 78.0–100.0)
PLATELETS: 120 10*3/uL — AB (ref 150–400)
RBC: 3.75 MIL/uL — AB (ref 4.22–5.81)
RDW: 13.3 % (ref 11.5–15.5)
WBC: 12.7 10*3/uL — AB (ref 4.0–10.5)

## 2016-04-07 LAB — BASIC METABOLIC PANEL
Anion gap: 2 — ABNORMAL LOW (ref 5–15)
BUN: 15 mg/dL (ref 6–20)
CHLORIDE: 104 mmol/L (ref 101–111)
CO2: 37 mmol/L — AB (ref 22–32)
CREATININE: 0.41 mg/dL — AB (ref 0.61–1.24)
Calcium: 9.8 mg/dL (ref 8.9–10.3)
GFR calc non Af Amer: >60 mL/min (ref >60)
Glucose, Bld: 157 mg/dL — ABNORMAL HIGH (ref 65–99)
Potassium: 4.3 mmol/L (ref 3.5–5.1)
SODIUM: 143 mmol/L (ref 135–145)

## 2016-04-07 MED ORDER — LORAZEPAM 0.5 MG PO TABS: 0.5000 mg | ORAL_TABLET | Freq: Once | ORAL | Status: DC

## 2016-04-07 MED ORDER — LORAZEPAM 2 MG/ML IJ SOLN
INTRAMUSCULAR | Status: AC
  Filled 2016-04-07: qty 1

## 2016-04-07 MED ORDER — METOPROLOL SUCCINATE ER 25 MG PO TB24
12.5000 mg | ORAL_TABLET | Freq: Two times a day (BID) | ORAL | Status: DC
  Administered 2016-04-07 – 2016-04-17 (×20): 12.5 mg via ORAL
  Filled 2016-04-07 (×21): qty 1

## 2016-04-07 MED ORDER — NICOTINE 21 MG/24HR TD PT24
21.0000 mg | MEDICATED_PATCH | Freq: Every day | TRANSDERMAL | Status: DC
  Administered 2016-04-07 – 2016-04-17 (×11): 21 mg via TRANSDERMAL
  Filled 2016-04-07 (×11): qty 1

## 2016-04-07 MED ORDER — LORAZEPAM 2 MG/ML IJ SOLN
0.5000 mg | Freq: Once | INTRAMUSCULAR | Status: AC
  Administered 2016-04-07: 0.5 mg via INTRAVENOUS

## 2016-04-07 MED ORDER — LORAZEPAM 2 MG/ML IJ SOLN
0.5000 mg | Freq: Four times a day (QID) | INTRAMUSCULAR | Status: DC
  Administered 2016-04-07 – 2016-04-09 (×5): 0.5 mg via INTRAVENOUS
  Filled 2016-04-07 (×5): qty 1

## 2016-04-07 NOTE — Progress Notes (Signed)
Pts mental status has progressively gotten worse. Pt stating people are in his yard and the dog keeps messing at his feet. He also states people were going to be coming in the window and pipes through the walls. He knew he was in [place] but said it was 2016. MD notifed,  RT drew ABG. RN to continue to monitor closely.  Ericka Pontiff, RN 2:26 AM 04/07/16

## 2016-04-07 NOTE — Progress Notes (Signed)
PROGRESS NOTE    Tommy Turner  [id number] DOB: August 31, 1940 DOA: 04/04/2016 PCP: Sallee Lange, MD    Brief Narrative:  75 y.o. male with medical history significant of AAA, anxiety, CAD, HTN, HLD, chronic systolic CHF EF 0000000, COPD oxygen dependent (2L), MI, and tobacco abuse presented with complaint of gradually worsening shortness of breath. On arrival, patient was hypoxic, febrile, tachycardic, tachypneic, hypotensive, and had a WBC of 23,000. While in the ED, patient was given IV fluids and antibiotics as well as Solu-Medrol. CXR was remarkable for pneumonia. Patient was also put on BiPAP. Patient was admitted for further treatment of sepsis and respiratory failure.   Assessment & Plan:   Principal Problem:   Sepsis ([place]) Active Problems:   Essential hypertension   CAD (coronary artery disease)   Anxiety   COPD with acute exacerbation (HCC)   Chronic pain syndrome   Acute on chronic respiratory failure with hypoxia (HCC)   Chronic systolic CHF (congestive heart failure) (HCC)   Hypotension   CAP (community acquired pneumonia)   Anemia   Thrombocytopenia (HCC)   Fever   Elevated troponin   Acute encephalopathy  1. Sepsis secondary to community acquired pneumonia. Upon arrival patient was noted to be hypoxic, tachypneic, hypotensive, and tachycardic. Chest x-ray was notable for pneumonia. Blood cultures and sputum cultures have been ordered. Influenza panel was negative. He was started on ceftriaxone, azithromycin and vancomycin. MRSA PCR is negative, so vancomycin discontinued. Lactic acid normal range. He was bolused IV fluids per sepsis protocol. Will hold off on further fluids, so as not to precipitate CHF exacerbation.   2. Acute on chronic respiratory failure. Related to pneumonia. Patient is chronically on 2L. He was placed on bipap on admission due to increased work of breathing and respiratory acidosis. PCO2 has improved since admission. When taken off bipap, he  desaturates into the 80s and has to go back on bipap. Pulmonology following. 3. Acute encephalopathy. Patient has become increasingly confused and agitated overnight. He is having hallucinations. Review of medications show that he was taking clonazepam at home which was not continued on admission due to lethargy. He may be having some element of benzo withdrawal. Agree with starting ativan. ABG showed improvement of PCO2. No fever or signs of worsening infection. 4. Elevated Troponin. Patient presented with an elevated troponin of 0.07. Likely demand ischemia. No complaints of chest pain. Cardiology following.  5. CAD. No complaints of chest pain. Continue Lipitor, nitroglycerin, and metoprolol.  6. COPD exacerbation. He was noted to be short of breath and wheezing on admission. Continue on bronchodilators, abx and steroids.  7. Chronic systolic congestive heart failure, EF 20-25% in 04/2015. Compensated at this time. BB and diuretics on hold due to low blood pressures.  8. HTN. Pressures are improving with IV fluids, but still low range of normal. Continue to hold antihypertensives for now.  9. HLD. Continue statin.  10. Anxiety and Depression. Stable.   11. Tobacco abuse. Counseled on cessation.   12. Chronic pain syndrome. Continue home pain regimen 13. Thrombocytopenia. Somewhat chronic, but likely lower due to acute infection. Continue to follow. No signs of bleeding at this time. Overall improving.  DVT prophylaxis: Lovenox Code Status: Full Family Communication: no family at bedside Disposition Plan: Discharge home once improved   Consultants:   Cardiology  pulmonology  Procedures:   BiPAP  Antimicrobials:   IV Zithromax 11/30 >> 12/7  IV Rocephin 11/30 >> 12/7  Vancomycin 11/29 >> 11/30   Subjective:  Patient has become increasingly confused and agitated overnight  Objective: Vitals:   04/07/16 0400 04/07/16 0425 04/07/16 0805 04/07/16 0818  BP:  125/70    Pulse:   90    Resp:  15    Temp: 97.6 F (36.4 C)  97.4 F (36.3 C)   TempSrc: Axillary  Axillary   SpO2:    92%  Weight: 63.7 kg (140 lb 6.9 oz)     Height:        Intake/Output Summary (Last 24 hours) at 04/07/16 0917 Last data filed at 04/07/16 0300  Gross per 24 hour  Intake              600 ml  Output             1000 ml  Net             -400 ml   Filed Weights   04/06/16 0453 04/06/16 0735 04/07/16 0400  Weight: 68.5 kg (151 lb 0.2 oz) 63.5 kg (139 lb 15.9 oz) 63.7 kg (140 lb 6.9 oz)    Examination:  General exam: agitated, confused Respiratory system: clear bilaterally. Respiratory effort normal. Cardiovascular system: S1 & S2 heard, RRR. No JVD, murmurs, rubs, gallops or clicks. trace pedal edema. Gastrointestinal system: Abdomen is nondistended, soft and nontender. No organomegaly or masses felt. Normal bowel sounds heard. Central nervous system: disoriented. No focal neurological deficits. Extremities: Symmetric 5 x 5 power. Skin: No rashes, lesions or ulcers Psychiatry: confused and agitated     Data Reviewed: I have personally reviewed following labs and imaging studies  CBC:  Recent Labs Lab 04/04/16 0822 04/05/16 0403 04/06/16 0359 04/07/16 0436  WBC 23.1* 19.3* 16.3* 12.7*  NEUTROABS 22.2*  --   --   --   HGB 14.9 11.7* 11.2* 11.5*  HCT 47.8 38.4* 35.9* 37.5*  MCV 101.1* 102.1* 100.8* 100.0  PLT 142* 101* 114* [id number]*   Basic Metabolic Panel:  Recent Labs Lab 04/04/16 0822 04/05/16 0403 04/06/16 0359 04/07/16 0436  NA 141 141 139 143  K 4.2 3.9 4.1 4.3  CL 99* 105 101 104  CO2 35* 32 34* 37*  GLUCOSE 151* 119* 163* 157*  BUN 22* 17 16 15   CREATININE 0.72 0.45* 0.51* 0.41*  CALCIUM 8.8* 8.6* 9.2 9.8  MG  --  1.7 2.2  --    GFR: Estimated Creatinine Clearance: 70.8 mL/min (by C-G formula based on SCr of 0.41 mg/dL (L)). Liver Function Tests: No results for input(s): AST, ALT, ALKPHOS, BILITOT, PROT, ALBUMIN in the last 168 hours. No results  for input(s): LIPASE, AMYLASE in the last 168 hours. No results for input(s): AMMONIA in the last 168 hours. Coagulation Profile: No results for input(s): INR, PROTIME in the last 168 hours. Cardiac Enzymes:  Recent Labs Lab 04/04/16 0822  TROPONINI 0.07*   BNP (last 3 results) No results for input(s): PROBNP in the last 8760 hours. HbA1C: No results for input(s): HGBA1C in the last 72 hours. CBG: No results for input(s): GLUCAP in the last 168 hours. Lipid Profile: No results for input(s): CHOL, HDL, LDLCALC, TRIG, CHOLHDL, LDLDIRECT in the last 72 hours. Thyroid Function Tests: No results for input(s): TSH, T4TOTAL, FREET4, T3FREE, THYROIDAB in the last 72 hours. Anemia Panel: No results for input(s): VITAMINB12, FOLATE, FERRITIN, TIBC, IRON, RETICCTPCT in the last 72 hours. Sepsis Labs:  Recent Labs Lab 04/04/16 0822 04/04/16 1220 04/04/16 1555 04/04/16 1602 04/04/16 1943  PROCALCITON  --   --  15.58  --   --  LATICACIDVEN 1.6 1.2  --  1.0 1.1    Recent Results (from the past 240 hour(s))  Blood culture (routine x 2)     Status: None (Preliminary result)   Collection Time: 04/04/16  8:32 AM  Result Value Ref Range Status   Specimen Description BLOOD LEFT HAND  Final   Special Requests   Final    BOTTLES DRAWN AEROBIC AND ANAEROBIC AEB=10CC ANA=8CC   Culture NO GROWTH 2 DAYS  Final   Report Status PENDING  Incomplete  Blood culture (routine x 2)     Status: None (Preliminary result)   Collection Time: 04/04/16  8:37 AM  Result Value Ref Range Status   Specimen Description BLOOD RIGHT HAND  Final   Special Requests BOTTLES DRAWN AEROBIC AND ANAEROBIC 10CC ANA=8CC  Final   Culture NO GROWTH 2 DAYS  Final   Report Status PENDING  Incomplete  Urine culture     Status: Abnormal   Collection Time: 04/04/16 11:08 AM  Result Value Ref Range Status   Specimen Description URINE, CLEAN CATCH  Final   Special Requests NONE  Final   Culture MULTIPLE SPECIES PRESENT,  SUGGEST RECOLLECTION (A)  Final   Report Status 04/06/2016 FINAL  Final  MRSA PCR Screening     Status: None   Collection Time: 04/04/16 12:15 PM  Result Value Ref Range Status   MRSA by PCR NEGATIVE NEGATIVE Final    Comment:        The GeneXpert MRSA Assay (FDA approved for NASAL specimens only), is one component of a comprehensive MRSA colonization surveillance program. It is not intended to diagnose MRSA infection nor to guide or monitor treatment for MRSA infections.   Culture, sputum-assessment     Status: None   Collection Time: 04/05/16 11:50 PM  Result Value Ref Range Status   Specimen Description SPUTUM  Final   Special Requests NONE  Final   Sputum evaluation   Final    THIS SPECIMEN IS ACCEPTABLE. RESPIRATORY CULTURE REPORT TO FOLLOW.   Report Status 04/06/2016 FINAL  Final  Culture, respiratory (NON-Expectorated)     Status: None (Preliminary result)   Collection Time: 04/05/16 11:50 PM  Result Value Ref Range Status   Specimen Description SPUTUM  Final   Special Requests NONE  Final   Gram Stain   Final    FEW WBC PRESENT, PREDOMINANTLY PMN RARE GRAM POSITIVE COCCI IN PAIRS AND CHAINS RARE GRAM NEGATIVE COCCOBACILLI RARE GRAM POSITIVE RODS    Culture   Final    NO GROWTH < 24 HOURS Performed at [facility]    Report Status PENDING  Incomplete         Radiology Studies: No results found.      Scheduled Meds: . aspirin EC  81 mg Oral Daily  . atorvastatin  80 mg Oral Daily  . azithromycin  500 mg Intravenous Q24H  . cefTRIAXone (ROCEPHIN)  IV  1 g Intravenous Q24H  . chlorhexidine  15 mL Mouth Rinse BID  . enoxaparin (LOVENOX) injection  40 mg Subcutaneous Q24H  . feeding supplement (ENSURE ENLIVE)  237 mL Oral TID BM  . finasteride  5 mg Oral Daily  . guaiFENesin  1,200 mg Oral BID  . ipratropium-albuterol  3 mL Nebulization Q6H  . LORazepam  0.5 mg Intravenous Q6H  . mouth rinse  15 mL Mouth Rinse q12n4p  . methylPREDNISolone  (SOLU-MEDROL) injection  60 mg Intravenous Q6H  . mometasone-formoterol  2 puff Inhalation BID  Continuous Infusions:    LOS: 3 days    Time spent: 25 minutes    Kathie Dike, MD [facility]   If 7PM-7AM, please contact night-coverage [email or web address] Password [facility] 04/07/2016, 9:17 AM

## 2016-04-07 NOTE — Progress Notes (Addendum)
Consult requested by:Dr. Memon Consult requested for respiratory failure:  HPI: This is a 75 year old who was admitted to the hospital on November 29 with increasing shortness of breath hypoxia and acute on chronic respiratory failure. At baseline he has abdominal aortic aneurysm, anxiety, coronary artery occlusive disease, hypertension, chronic systolic heart failure with an ejection fraction of 20-25%, COPD, chronic hypoxic respiratory failure on 2 L oxygen at home and he also has had continued tobacco abuse. He had increasing shortness of breath and came to the emergency department he was found to be hypoxic and septic. He was given IV fluids given Solu-Medrol and his chest x-ray showed pneumonia. He was started on BiPAP. Since that time he has been treated for sepsis and pneumonia but has continued with very high oxygen requirements and has continued at least intermittently needing BiPAP. He is very agitated and confused and hallucinating. History is from the medical record because of his confusion and agitation.  Past Medical History:  Diagnosis Date  . AAA (abdominal aortic aneurysm) ([place])   . Aneurysm ([place])   . Anxiety    takes Xanax daily as needed  . CAD (coronary artery disease)   . CHF (congestive heart failure) ([place])   . COPD (chronic obstructive pulmonary disease) ([facility])    Albuterol inhaler and neb daily as needed;takes SPiriva daily  . Depression    takes Celexa daily  . Foley catheter in place   . History of shingles   . Hyperlipidemia    takes Atorvastatin daily  . Hypertension    takes Metoprolol and Benazepril  daily  . Myocardial infarction    20+yrs ago  . Oxygen dependent    2L/[place]   . Peripheral edema    takes Furosemide daily  . Shortness of breath     on oxygen  2 liters continuous  . Ulcer ([place])    gastric     Family History  Problem Relation Age of Onset  . Hypertension Brother   . Heart disease Brother     Heart Disease before age 9  . Heart attack  Brother   . Cancer Brother   . Cancer Mother   . Diabetes Mother   . Heart disease Mother     Heart Disease before age 41  . Hypertension Mother   . Heart attack Mother   . Cancer Father     prostate  . Hypertension Father   . COPD Sister   . Diabetes Sister   . Heart disease Sister   . Hypertension Sister   . Heart attack Sister   . Colon cancer Neg Hx      Social History   Social History  . Marital status: Married    Spouse name: N/A  . Number of children: N/A  . Years of education: N/A   Social History Main Topics  . Smoking status: Former Smoker    Packs/day: 0.25    Years: 58.00    Types: Cigarettes    Quit date: 11/02/2014  . Smokeless tobacco: Never Used     Comment: vapor ciggs  . Alcohol use No  . Drug use: No  . Sexual activity: Not Currently   Other Topics Concern  . None   Social History Narrative  . None     ROS: Not obtainable    Objective: Vital signs in last 24 hours: Temp:  [97.4 F (36.3 C)-98.6 F (37 C)] 97.4 F (36.3 C) (12/02 0805) Pulse Rate:  [75-103] 90 (12/02 0425) Resp:  [  15-28] 15 (12/02 0425) BP: (117-128)/(59-70) 125/70 (12/02 0425) SpO2:  [82 %-97 %] 92 % (12/02 0818) FiO2 (%):  [45 %] 45 % (12/02 0818) Weight:  [63.7 kg (140 lb 6.9 oz)] 63.7 kg (140 lb 6.9 oz) (12/02 0400) Weight change: -5 kg (-11 lb 0.4 oz) Last BM Date: 04/03/16  Intake/Output from previous day: 12/01 0701 - 12/02 0700 In: 840 [P.O.:240; IV Piggyback:600] Out: 1225 [Urine:1225]  PHYSICAL EXAM Constitutional: He is a thin male who is very agitated. He is wearing nasal oxygen now but his oxygen saturations dropping down toward 80. Eyes: Pupils react. EOMI. Ears nose and throat: His mucous membranes are minimally dry. Cardiovascular: His heart is regular without gallop. He does not have any edema. Respiratory: His respiratory rates about 20 respiratory effort looks normal. His lungs are clear with fairly markedly diminished breath sounds  gastrointestinal: His abdomen is soft without masses bowel sounds are present musculoskeletal: I don't see any swelling of the joints skin: Warm and dry neurological/psychiatric: He is very agitated and disoriented and has been hallucinating.  Lab Results: Basic Metabolic Panel:  Recent Labs  04/05/16 0403 04/06/16 0359 04/07/16 0436  NA 141 139 143  K 3.9 4.1 4.3  CL 105 101 104  CO2 32 34* 37*  GLUCOSE 119* 163* 157*  BUN 17 16 15   CREATININE 0.45* 0.51* 0.41*  CALCIUM 8.6* 9.2 9.8  MG 1.7 2.2  --    Liver Function Tests: No results for input(s): AST, ALT, ALKPHOS, BILITOT, PROT, ALBUMIN in the last 72 hours. No results for input(s): LIPASE, AMYLASE in the last 72 hours. No results for input(s): AMMONIA in the last 72 hours. CBC:  Recent Labs  04/06/16 0359 04/07/16 0436  WBC 16.3* 12.7*  HGB 11.2* 11.5*  HCT 35.9* 37.5*  MCV 100.8* 100.0  PLT 114* 120*   Cardiac Enzymes: No results for input(s): CKTOTAL, CKMB, CKMBINDEX, TROPONINI in the last 72 hours. BNP: No results for input(s): PROBNP in the last 72 hours. D-Dimer: No results for input(s): DDIMER in the last 72 hours. CBG: No results for input(s): GLUCAP in the last 72 hours. Hemoglobin A1C: No results for input(s): HGBA1C in the last 72 hours. Fasting Lipid Panel: No results for input(s): CHOL, HDL, LDLCALC, TRIG, CHOLHDL, LDLDIRECT in the last 72 hours. Thyroid Function Tests: No results for input(s): TSH, T4TOTAL, FREET4, T3FREE, THYROIDAB in the last 72 hours. Anemia Panel: No results for input(s): VITAMINB12, FOLATE, FERRITIN, TIBC, IRON, RETICCTPCT in the last 72 hours. Coagulation: No results for input(s): LABPROT, INR in the last 72 hours. Urine Drug Screen: Drugs of Abuse  No results found for: LABOPIA, COCAINSCRNUR, LABBENZ, AMPHETMU, THCU, LABBARB  Alcohol Level: No results for input(s): ETH in the last 72 hours. Urinalysis: No results for input(s): COLORURINE, LABSPEC, PHURINE, GLUCOSEU,  HGBUR, BILIRUBINUR, KETONESUR, PROTEINUR, UROBILINOGEN, NITRITE, LEUKOCYTESUR in the last 72 hours.  Invalid input(s): APPERANCEUR Misc. Labs:   ABGS:  Recent Labs  04/07/16 0217  PHART 7.392  PO2ART 86.2  HCO3 33.5*     MICROBIOLOGY: Recent Results (from the past 240 hour(s))  Blood culture (routine x 2)     Status: None (Preliminary result)   Collection Time: 04/04/16  8:32 AM  Result Value Ref Range Status   Specimen Description BLOOD LEFT HAND  Final   Special Requests   Final    BOTTLES DRAWN AEROBIC AND ANAEROBIC AEB=10CC ANA=8CC   Culture NO GROWTH 2 DAYS  Final   Report Status PENDING  Incomplete  Blood culture (routine x 2)     Status: None (Preliminary result)   Collection Time: 04/04/16  8:37 AM  Result Value Ref Range Status   Specimen Description BLOOD RIGHT HAND  Final   Special Requests BOTTLES DRAWN AEROBIC AND ANAEROBIC 10CC ANA=8CC  Final   Culture NO GROWTH 2 DAYS  Final   Report Status PENDING  Incomplete  Urine culture     Status: Abnormal   Collection Time: 04/04/16 11:08 AM  Result Value Ref Range Status   Specimen Description URINE, CLEAN CATCH  Final   Special Requests NONE  Final   Culture MULTIPLE SPECIES PRESENT, SUGGEST RECOLLECTION (A)  Final   Report Status 04/06/2016 FINAL  Final  MRSA PCR Screening     Status: None   Collection Time: 04/04/16 12:15 PM  Result Value Ref Range Status   MRSA by PCR NEGATIVE NEGATIVE Final    Comment:        The GeneXpert MRSA Assay (FDA approved for NASAL specimens only), is one component of a comprehensive MRSA colonization surveillance program. It is not intended to diagnose MRSA infection nor to guide or monitor treatment for MRSA infections.   Culture, sputum-assessment     Status: None   Collection Time: 04/05/16 11:50 PM  Result Value Ref Range Status   Specimen Description SPUTUM  Final   Special Requests NONE  Final   Sputum evaluation   Final    THIS SPECIMEN IS ACCEPTABLE.  RESPIRATORY CULTURE REPORT TO FOLLOW.   Report Status 04/06/2016 FINAL  Final  Culture, respiratory (NON-Expectorated)     Status: None (Preliminary result)   Collection Time: 04/05/16 11:50 PM  Result Value Ref Range Status   Specimen Description SPUTUM  Final   Special Requests NONE  Final   Gram Stain   Final    FEW WBC PRESENT, PREDOMINANTLY PMN RARE GRAM POSITIVE COCCI IN PAIRS AND CHAINS RARE GRAM NEGATIVE COCCOBACILLI RARE GRAM POSITIVE RODS    Culture   Final    NO GROWTH < 24 HOURS Performed at [facility]    Report Status PENDING  Incomplete    Studies/Results: No results found.  Medications:  Prior to Admission:  Prescriptions Prior to Admission  Medication Sig Dispense Refill Last Dose  . albuterol (PROVENTIL HFA;VENTOLIN HFA) 108 (90 Base) MCG/ACT inhaler Inhale 2 puffs into the lungs every 4 (four) hours as needed for wheezing or shortness of breath.   04/04/2016 at Unknown time  . albuterol (PROVENTIL) (2.5 MG/3ML) 0.083% nebulizer solution Take 3 mLs (2.5 mg total) by nebulization every 4 (four) hours as needed for wheezing or shortness of breath. 75 mL 12 04/04/2016 at Unknown time  . aspirin EC 81 MG tablet Take 81 mg by mouth daily.    04/03/2016 at Unknown time  . atorvastatin (LIPITOR) 80 MG tablet Take 1 tablet (80 mg total) by mouth daily. 90 tablet 3 04/03/2016 at Unknown time  . benazepril (LOTENSIN) 5 MG tablet Take 1 tablet (5 mg total) by mouth daily. 90 tablet 0 04/03/2016 at Unknown time  . budesonide-formoterol (SYMBICORT) 160-4.5 MCG/ACT inhaler Inhale 2 puffs into the lungs 2 (two) times daily. 3 Inhaler 3 04/03/2016 at Unknown time  . clonazePAM (KLONOPIN) 0.5 MG tablet TAKE 1 TABLET BY MOUTH TWICE DAILY AS NEEDED FOR ANXIETY 60 tablet 5 04/03/2016 at Unknown time  . finasteride (PROSCAR) 5 MG tablet Take 5 mg by mouth daily.  11 04/03/2016 at Unknown time  . metoprolol succinate (TOPROL-XL) 25  MG 24 hr tablet TAKE 1/2 TABLET BY MOUTH 2  TIMES DAILY. 90 tablet 3 04/03/2016 at 2000  . potassium chloride SA (K-DUR,KLOR-CON) 20 MEQ tablet Take 1 tablet (20 mEq total) by mouth daily. 90 tablet 3 04/03/2016 at Unknown time  . tiotropium (SPIRIVA) 18 MCG inhalation capsule Place 18 mcg into inhaler and inhale daily.   04/03/2016 at Unknown time  . torsemide (DEMADEX) 20 MG tablet Take 2 tablets (40 mg total) by mouth daily. 180 tablet 1 04/03/2016 at Unknown time  . HYDROcodone-acetaminophen (NORCO) 10-325 MG tablet Take 0.5 tablets by mouth every 6 (six) hours as needed for severe pain. 60 tablet 0 unknown  . nitroGLYCERIN (NITROSTAT) 0.4 MG SL tablet Place 1 tablet (0.4 mg total) under the tongue every 5 (five) minutes as needed for chest pain. 100 tablet 0 unknown   Scheduled: . aspirin EC  81 mg Oral Daily  . atorvastatin  80 mg Oral Daily  . azithromycin  500 mg Intravenous Q24H  . cefTRIAXone (ROCEPHIN)  IV  1 g Intravenous Q24H  . chlorhexidine  15 mL Mouth Rinse BID  . enoxaparin (LOVENOX) injection  40 mg Subcutaneous Q24H  . feeding supplement (ENSURE ENLIVE)  237 mL Oral TID BM  . finasteride  5 mg Oral Daily  . guaiFENesin  1,200 mg Oral BID  . ipratropium-albuterol  3 mL Nebulization Q6H  . LORazepam  0.5 mg Intravenous Q6H  . mouth rinse  15 mL Mouth Rinse q12n4p  . methylPREDNISolone (SOLU-MEDROL) injection  60 mg Intravenous Q6H  . mometasone-formoterol  2 puff Inhalation BID   Continuous:  [id number] **OR** acetaminophen, albuterol, HYDROcodone-acetaminophen, ondansetron **OR** ondansetron (ZOFRAN) IV  Assesment: He was admitted with community-acquired pneumonia, sepsis, acute on chronic hypoxic respiratory failure, COPD exacerbation and he has had acute encephalopathy which is ongoing. He is continuing to have difficulty maintaining his oxygen saturation off BiPAP. He is on appropriate treatment for pneumonia and he is on steroids both for the pneumonia and for COPD exacerbation. Principal Problem:    Sepsis ([place]) Active Problems:   Essential hypertension   CAD (coronary artery disease)   Anxiety   COPD with acute exacerbation ([facility])   Chronic pain syndrome   Acute on chronic respiratory failure with hypoxia ([facility])   Chronic systolic CHF (congestive heart failure) ([facility])   Hypotension   CAP (community acquired pneumonia)   Anemia   Thrombocytopenia ([facility])   Fever   Elevated troponin   Acute encephalopathy    Plan: Discussed with Dr. Roderic Palau I think we'll try to continue on BiPAP as needed to avoid intubation and mechanical ventilation if possible. Continue current treatments because I think he is on appropriate care. [facility] Ativan to see if some of his problem is withdrawal from his chronic benzo at home. Start nicotine patch. Recheck blood gas in the morning. Recheck chest x-ray in the morning. He is high risk to need intubation and mechanical ventilation because of his severe COPD at baseline and his mental status    LOS: 3 days   [place] L 04/07/2016, 9:17 AM

## 2016-04-07 NOTE — Progress Notes (Signed)
Bipap has high leak RRT is aware and we have tried measures to decrease the leak and help the seal around pt face. There hasn't been any repeat ABGs done at this time. Pt has a small change in mental status. He is still alert and able to tell me where he is at but is really figidity with his hands and saying comments that do not make much since to the situation. Almost seems like a touch of delirium or lack of sleep. RN to continue to monitor and will contact MD if persist and becomes worse. Pt is calm at this time and seems to be resting.  Ericka Pontiff, RN 1:30 AM 04/07/16

## 2016-04-08 ENCOUNTER — Inpatient Hospital Stay (HOSPITAL_COMMUNITY): Payer: 59

## 2016-04-08 LAB — BASIC METABOLIC PANEL
Anion gap: 3 — ABNORMAL LOW (ref 5–15)
BUN: 17 mg/dL (ref 6–20)
CHLORIDE: 100 mmol/L — AB (ref 101–111)
CO2: 38 mmol/L — ABNORMAL HIGH (ref 22–32)
CREATININE: 0.36 mg/dL — AB (ref 0.61–1.24)
Calcium: 9.9 mg/dL (ref 8.9–10.3)
GFR calc non Af Amer: >60 mL/min (ref >60)
Glucose, Bld: 127 mg/dL — ABNORMAL HIGH (ref 65–99)
POTASSIUM: 4.8 mmol/L (ref 3.5–5.1)
SODIUM: 141 mmol/L (ref 135–145)

## 2016-04-08 LAB — CULTURE, RESPIRATORY W GRAM STAIN: Culture: NORMAL

## 2016-04-08 LAB — CBC
HCT: 39.1 % (ref 39.0–52.0)
Hemoglobin: 11.9 g/dL — ABNORMAL LOW (ref 13.0–17.0)
MCH: 30.8 pg (ref 26.0–34.0)
MCHC: 30.4 g/dL (ref 30.0–36.0)
MCV: 101.3 fL — AB (ref 78.0–100.0)
PLATELETS: 119 10*3/uL — AB (ref 150–400)
RBC: 3.86 MIL/uL — AB (ref 4.22–5.81)
RDW: 13.3 % (ref 11.5–15.5)
WBC: 9.3 10*3/uL (ref 4.0–10.5)

## 2016-04-08 LAB — BLOOD GAS, ARTERIAL
Acid-Base Excess: 13.6 mmol/L — ABNORMAL HIGH (ref 0.0–2.0)
Bicarbonate: 35.1 mmol/L — ABNORMAL HIGH (ref 20.0–28.0)
DELIVERY SYSTEMS: POSITIVE
Drawn by: 21310
Expiratory PAP: 8
FIO2: 45
INSPIRATORY PAP: 18
O2 Saturation: 96 %
PO2 ART: 99.6 mmHg (ref 83.0–108.0)
Patient temperature: 37
pCO2 arterial: 76.1 mmHg (ref 32.0–48.0)
pH, Arterial: 7.339 — ABNORMAL LOW (ref 7.350–7.450)

## 2016-04-08 LAB — CULTURE, RESPIRATORY

## 2016-04-08 NOTE — Progress Notes (Signed)
PROGRESS NOTE    Tommy Turner  [id number] DOB: Mar 11, 1941 DOA: 04/04/2016 PCP: Sallee Lange, MD    Brief Narrative:  75 y.o. male with medical history significant of AAA, anxiety, CAD, HTN, HLD, chronic systolic CHF EF 0000000, COPD oxygen dependent (2L), MI, and tobacco abuse presented with complaint of gradually worsening shortness of breath. On arrival, patient was hypoxic, febrile, tachycardic, tachypneic, hypotensive, and had a WBC of 23,000. While in the ED, patient was given IV fluids and antibiotics as well as Solu-Medrol. CXR was remarkable for pneumonia. Patient was also put on BiPAP. Patient was admitted for further treatment of sepsis and respiratory failure.   Assessment & Plan:   Principal Problem:   Sepsis ([place]) Active Problems:   Essential hypertension   CAD (coronary artery disease)   Anxiety   COPD with acute exacerbation ([facility])   Chronic pain syndrome   Acute on chronic respiratory failure with hypoxia ([facility])   Chronic systolic CHF (congestive heart failure) ([facility])   Hypotension   CAP (community acquired pneumonia)   Anemia   Thrombocytopenia ([facility])   Fever   Elevated troponin   Acute encephalopathy  1. Sepsis secondary to community acquired pneumonia. Upon arrival patient was noted to be hypoxic, tachypneic, hypotensive, and tachycardic. Chest x-ray was notable for pneumonia. Blood cultures and sputum cultures have been ordered. Influenza panel was negative. He was started on ceftriaxone, azithromycin and vancomycin. MRSA PCR is negative, so vancomycin discontinued. Lactic acid normal range. He was bolused IV fluids per sepsis protocol. Will hold off on further fluids, so as not to precipitate CHF exacerbation.   2. Acute on chronic respiratory failure. Related to pneumonia. Patient is chronically on 2L. He was placed on bipap on admission due to increased work of breathing and respiratory acidosis. PCO2 has improved since admission. When taken off bipap, he  desaturates into the 80s and has to go back on bipap. Pulmonology following. Will try and wean off bipap as tolerated. 3. Acute encephalopathy. Patient became agitated and was having hallucinations. Review of medications show that he was taking clonazepam at home which was not continued on admission due to lethargy. He may have been having some element of benzo withdrawal. Started on ativan with some improvement. ABG showed improvement of PCO2. No fever or signs of worsening infection. 4. Elevated Troponin. Patient presented with an elevated troponin of 0.07. Likely demand ischemia. No complaints of chest pain. Cardiology following.  5. CAD. No complaints of chest pain. Continue Lipitor, nitroglycerin, and metoprolol.  6. COPD exacerbation. He was noted to be short of breath and wheezing on admission. Continue on bronchodilators, abx and steroids.  7. Chronic systolic congestive heart failure, EF 20-25% in 04/2015. Compensated at this time.He is on low dose beta blockers. Diuretics on hold due to low blood pressures.  8. HTN. Blood pressures have been stable. Continue to hold diuertices for now.  9. HLD. Continue statin.  10. Anxiety and Depression. Stable.   11. Tobacco abuse. Counseled on cessation.   12. Chronic pain syndrome. Continue home pain regimen 13. Thrombocytopenia. Somewhat chronic, but likely lower due to acute infection. Continue to follow. No signs of bleeding at this time. Overall improving.  DVT prophylaxis: Lovenox Code Status: Full Family Communication: no family at bedside Disposition Plan: Discharge home once improved   Consultants:   Cardiology  pulmonology  Procedures:   BiPAP  Antimicrobials:   IV Zithromax 11/30 >> 12/7  IV Rocephin 11/30 >> 12/7  Vancomycin 11/29 >> 11/30  Subjective: Resting comfortably on bipap. Has productive cough  Objective: Vitals:   04/08/16 1000 04/08/16 1300 04/08/16 1310 04/08/16 1400  BP: 117/68 120/72  113/62    Pulse: 87 83  81  Resp: (!) 0 20  13  Temp:      TempSrc:      SpO2: 95% 100% 100% 99%  Weight:      Height:        Intake/Output Summary (Last 24 hours) at 04/08/16 1502 Last data filed at 04/08/16 0929  Gross per 24 hour  Intake              910 ml  Output             1450 ml  Net             -540 ml   Filed Weights   04/06/16 0735 04/07/16 0400 04/08/16 0439  Weight: 63.5 kg (139 lb 15.9 oz) 63.7 kg (140 lb 6.9 oz) 65.5 kg (144 lb 6.4 oz)    Examination:  General exam: calm, resting on bipap Respiratory system: clear bilaterally. Respiratory effort normal. Cardiovascular system: S1 & S2 heard, RRR. No JVD, murmurs, rubs, gallops or clicks. No pedal edema. Gastrointestinal system: Abdomen is nondistended, soft and nontender. No organomegaly or masses felt. Normal bowel sounds heard. Central nervous system:  No focal neurological deficits. Extremities: Symmetric 5 x 5 power. Skin: No rashes, lesions or ulcers Psychiatry: calm, cooperative     Data Reviewed: I have personally reviewed following labs and imaging studies  CBC:  Recent Labs Lab 04/04/16 0822 04/05/16 0403 04/06/16 0359 04/07/16 0436 04/08/16 0428  WBC 23.1* 19.3* 16.3* 12.7* 9.3  NEUTROABS 22.2*  --   --   --   --   HGB 14.9 11.7* 11.2* 11.5* 11.9*  HCT 47.8 38.4* 35.9* 37.5* 39.1  MCV 101.1* 102.1* 100.8* 100.0 101.3*  PLT 142* 101* 114* 120* [id number]*   Basic Metabolic Panel:  Recent Labs Lab 04/04/16 0822 04/05/16 0403 04/06/16 0359 04/07/16 0436 04/08/16 0428  NA 141 141 139 143 141  K 4.2 3.9 4.1 4.3 4.8  CL 99* 105 101 104 100*  CO2 35* 32 34* 37* 38*  GLUCOSE 151* 119* 163* 157* 127*  BUN 22* 17 16 15 17   CREATININE 0.72 0.45* 0.51* 0.41* 0.36*  CALCIUM 8.8* 8.6* 9.2 9.8 9.9  MG  --  1.7 2.2  --   --    GFR: Estimated Creatinine Clearance: 72.8 mL/min (by C-G formula based on SCr of 0.36 mg/dL (L)). Liver Function Tests: No results for input(s): AST, ALT, ALKPHOS, BILITOT,  PROT, ALBUMIN in the last 168 hours. No results for input(s): LIPASE, AMYLASE in the last 168 hours. No results for input(s): AMMONIA in the last 168 hours. Coagulation Profile: No results for input(s): INR, PROTIME in the last 168 hours. Cardiac Enzymes:  Recent Labs Lab 04/04/16 0822  TROPONINI 0.07*   BNP (last 3 results) No results for input(s): PROBNP in the last 8760 hours. HbA1C: No results for input(s): HGBA1C in the last 72 hours. CBG: No results for input(s): GLUCAP in the last 168 hours. Lipid Profile: No results for input(s): CHOL, HDL, LDLCALC, TRIG, CHOLHDL, LDLDIRECT in the last 72 hours. Thyroid Function Tests: No results for input(s): TSH, T4TOTAL, FREET4, T3FREE, THYROIDAB in the last 72 hours. Anemia Panel: No results for input(s): VITAMINB12, FOLATE, FERRITIN, TIBC, IRON, RETICCTPCT in the last 72 hours. Sepsis Labs:  Recent Labs Lab 04/04/16 [id number] 04/04/16 1220  04/04/16 1555 04/04/16 1602 04/04/16 1943  PROCALCITON  --   --  15.58  --   --   LATICACIDVEN 1.6 1.2  --  1.0 1.1    Recent Results (from the past 240 hour(s))  Blood culture (routine x 2)     Status: None (Preliminary result)   Collection Time: 04/04/16  8:32 AM  Result Value Ref Range Status   Specimen Description BLOOD LEFT HAND  Final   Special Requests   Final    BOTTLES DRAWN AEROBIC AND ANAEROBIC AEB=10CC ANA=8CC   Culture NO GROWTH 2 DAYS  Final   Report Status PENDING  Incomplete  Blood culture (routine x 2)     Status: None (Preliminary result)   Collection Time: 04/04/16  8:37 AM  Result Value Ref Range Status   Specimen Description BLOOD RIGHT HAND  Final   Special Requests BOTTLES DRAWN AEROBIC AND ANAEROBIC 10CC ANA=8CC  Final   Culture NO GROWTH 2 DAYS  Final   Report Status PENDING  Incomplete  Urine culture     Status: Abnormal   Collection Time: 04/04/16 11:08 AM  Result Value Ref Range Status   Specimen Description URINE, CLEAN CATCH  Final   Special Requests  NONE  Final   Culture MULTIPLE SPECIES PRESENT, SUGGEST RECOLLECTION (A)  Final   Report Status 04/06/2016 FINAL  Final  MRSA PCR Screening     Status: None   Collection Time: 04/04/16 12:15 PM  Result Value Ref Range Status   MRSA by PCR NEGATIVE NEGATIVE Final    Comment:        The GeneXpert MRSA Assay (FDA approved for NASAL specimens only), is one component of a comprehensive MRSA colonization surveillance program. It is not intended to diagnose MRSA infection nor to guide or monitor treatment for MRSA infections.   Culture, sputum-assessment     Status: None   Collection Time: 04/05/16 11:50 PM  Result Value Ref Range Status   Specimen Description SPUTUM  Final   Special Requests NONE  Final   Sputum evaluation   Final    THIS SPECIMEN IS ACCEPTABLE. RESPIRATORY CULTURE REPORT TO FOLLOW.   Report Status 04/06/2016 FINAL  Final  Culture, respiratory (NON-Expectorated)     Status: None   Collection Time: 04/05/16 11:50 PM  Result Value Ref Range Status   Specimen Description SPUTUM  Final   Special Requests NONE  Final   Gram Stain   Final    FEW WBC PRESENT, PREDOMINANTLY PMN RARE GRAM POSITIVE COCCI IN PAIRS AND CHAINS RARE GRAM NEGATIVE COCCOBACILLI RARE GRAM POSITIVE RODS    Culture   Final    Consistent with normal respiratory flora. Performed at [facility]    Report Status 04/08/2016 FINAL  Final         Radiology Studies: Dg Chest Port 1 View  Result Date: 04/08/2016 CLINICAL DATA:  Shortness of breath. EXAM: PORTABLE CHEST 1 VIEW COMPARISON:  April 04, 2016 FINDINGS: Persistent infiltrate in the left lung, mildly improved in the interval. Stable cardiomegaly. Skin fold over the lateral right chest. No other changes. IMPRESSION: Mildly improved left-sided infiltrate. Recommend follow-up to resolution. Electronically Signed   By: Dorise Bullion III M.D   On: 04/08/2016 09:12        Scheduled Meds: . aspirin EC  81 mg Oral Daily  .  atorvastatin  80 mg Oral Daily  . azithromycin  500 mg Intravenous Q24H  . cefTRIAXone (ROCEPHIN)  IV  1 g  Intravenous Q24H  . chlorhexidine  15 mL Mouth Rinse BID  . enoxaparin (LOVENOX) injection  40 mg Subcutaneous Q24H  . feeding supplement (ENSURE ENLIVE)  237 mL Oral TID BM  . finasteride  5 mg Oral Daily  . guaiFENesin  1,200 mg Oral BID  . ipratropium-albuterol  3 mL Nebulization Q6H  . LORazepam  0.5 mg Intravenous Q6H  . mouth rinse  15 mL Mouth Rinse q12n4p  . methylPREDNISolone (SOLU-MEDROL) injection  60 mg Intravenous Q6H  . metoprolol succinate  12.5 mg Oral BID  . mometasone-formoterol  2 puff Inhalation BID  . nicotine  21 mg Transdermal Daily   Continuous Infusions:    LOS: 4 days    Time spent: 25 minutes    Kathie Dike, MD [facility]   If 7PM-7AM, please contact night-coverage [email or web address] Password [email or web address] 04/08/2016, 3:02 PM

## 2016-04-08 NOTE — Progress Notes (Signed)
DR DAVID CALLED ABG RESULTS. NO NEW ORDERS. PT IS ALERT WHEN AWAKENED FROM SLEEP. DRINKING PO FLUIDS W/O DIFFICULTY.

## 2016-04-08 NOTE — Consult Note (Signed)
Physician Addendum Pulmonology  Progress Notes Date of Service: 04/07/2016  9:17 AM        Expand All Collapse All              Hide copied text    Hover for attribution information                                                                                    Consult requested by:Dr. Memon  Consult requested for respiratory failure:     HPI: This is a 75 year old who was admitted to the hospital on November 29 with increasing shortness of breath hypoxia and acute on chronic respiratory failure. At baseline he has abdominal aortic aneurysm, anxiety, coronary artery occlusive disease, hypertension, chronic systolic heart failure with an ejection fraction of 20-25%, COPD, chronic hypoxic respiratory failure on 2 L oxygen at home and he also has had continued tobacco abuse. He had increasing shortness of breath and came to the emergency department he was found to be hypoxic and septic. He was given IV fluids given Solu-Medrol and his chest x-ray showed pneumonia. He was started on BiPAP. Since that time he has been treated for sepsis and pneumonia but has continued with very high oxygen requirements and has continued at least intermittently needing BiPAP. He is very agitated and confused and hallucinating. History is from the medical record because of his confusion and agitation.          Past Medical History:    Diagnosis   Date    .   AAA (abdominal aortic aneurysm) ([place])        .   Aneurysm ([place])        .   Anxiety            takes Xanax daily as needed    .   CAD (coronary artery disease)        .   CHF (congestive heart failure) ([place])        .   COPD (chronic obstructive pulmonary disease) ([facility])            Albuterol inhaler and neb daily as needed;takes SPiriva daily    .   Depression            takes Celexa daily    .    Foley catheter in place        .   History of shingles        .   Hyperlipidemia            takes Atorvastatin daily    .   Hypertension            takes Metoprolol and Benazepril  daily    .   Myocardial infarction            20+yrs ago    .   Oxygen dependent            2L/[place]     .   Peripheral edema            takes Furosemide daily    .   Shortness of breath  on oxygen  2 liters continuous    .   Ulcer ([place])            gastric                 Family History    Problem   Relation   Age of Onset    .   Hypertension   Brother        .   Heart disease   Brother                Heart Disease before age 42    .   Heart attack   Brother        .   Cancer   Brother        .   Cancer   Mother        .   Diabetes   Mother        .   Heart disease   Mother                Heart Disease before age 49    .   Hypertension   Mother        .   Heart attack   Mother        .   Cancer   Father                prostate    .   Hypertension   Father        .   COPD   Sister        .   Diabetes   Sister        .   Heart disease   Sister        .   Hypertension   Sister        .   Heart attack   Sister        .   Colon cancer   Neg Hx               Social History             Social History    .   Marital status:   Married            Spouse name:   N/A    .   Number of children:   N/A    .   Years of education:   N/A              Social History Main Topics    .   Smoking status:   Former Smoker            Packs/day:   0.25            Years:   58.00            Types:   Cigarettes            Quit date:   11/02/2014    .   Smokeless tobacco:   Never  Used                Comment: vapor ciggs    .   Alcohol use   No    .   Drug use:   No    .   Sexual activity:   Not Currently            Other Topics   Concern    .  None           Social History Narrative    .   None          ROS: Not obtainable           Objective:  Vital signs in last 24 hours:  Temp:  [97.4 F (36.3 C)-98.6 F (37 C)] 97.4 F (36.3 C) (12/02 0805)  Pulse Rate:  [75-103] 90 (12/02 0425)  Resp:  [15-28] 15 (12/02 0425)  BP: (117-128)/(59-70) 125/70 (12/02 0425)  SpO2:  [82 %-97 %] 92 % (12/02 0818)  FiO2 (%):  [45 %] 45 % (12/02 0818)  Weight:  [63.7 kg (140 lb 6.9 oz)] 63.7 kg (140 lb 6.9 oz) (12/02 0400)  Weight change: -5 kg (-11 lb 0.4 oz)  Last BM Date: 04/03/16     Intake/Output from previous day:  12/01 0701 - 12/02 0700  In: 840 [P.O.:240; IV Piggyback:600]  Out: 1225 [Urine:1225]     PHYSICAL EXAM  Constitutional: He is a thin male who is very agitated. He is wearing nasal oxygen now but his oxygen saturations dropping down toward 80. Eyes: Pupils react. EOMI. Ears nose and throat: His mucous membranes are minimally dry. Cardiovascular: His heart is regular without gallop. He does not have any edema. Respiratory: His respiratory rates about 20 respiratory effort looks normal. His lungs are clear with fairly markedly diminished breath sounds gastrointestinal: His abdomen is soft without masses bowel sounds are present musculoskeletal: I don't see any swelling of the joints skin: Warm and dry neurological/psychiatric: He is very agitated and disoriented and has been hallucinating.     Lab Results:  Basic Metabolic Panel:    Recent Labs (last 2 labs)                                                                                                                                                     Liver Function Tests:   Recent Labs (last 2 labs)         Recent Labs (last 2 labs)         Recent Labs (last 2 labs)        CBC:    Recent Labs (last 2 labs)                                                                             Cardiac Enzymes:   Recent Labs (last 2 labs)        BNP:   Recent Labs (last 2 labs)  D-Dimer:   Recent Labs (last 2 labs)        CBG:   Recent Labs (last 2 labs)        Hemoglobin A1C:   Recent Labs (last 2 labs)        Fasting Lipid Panel:   Recent Labs (last 2 labs)        Thyroid Function Tests:   Recent Labs (last 2 labs)        Anemia Panel:   Recent Labs (last 2 labs)        Coagulation:   Recent Labs (last 2 labs)        Urine Drug Screen:  Drugs of Abuse    Labs (Brief)        Alcohol Level:   Recent Labs (last 2 labs)        Urinalysis:    Recent Labs (last 2 labs)             Misc. Labs:        ABGS:    Recent Labs (last 2 labs)                                                  MICROBIOLOGY:          Recent Results (from the past 240 hour(s))    Blood culture (routine x 2)     Status: None (Preliminary result)        Collection Time: 04/04/16  8:32 AM    Result   Value   Ref Range   Status        Specimen Description   BLOOD LEFT HAND       Final        Special Requests           Final            BOTTLES DRAWN AEROBIC AND ANAEROBIC AEB=10CC ANA=8CC        Culture   NO GROWTH 2 DAYS       Final        Report Status   PENDING       Incomplete    Blood culture (routine x 2)     Status: None (Preliminary result)        Collection Time: 04/04/16  8:37 AM    Result   Value   Ref Range   Status        Specimen Description   BLOOD  RIGHT HAND       Final        Special Requests   BOTTLES DRAWN AEROBIC AND ANAEROBIC 10CC ANA=8CC       Final        Culture   NO GROWTH 2 DAYS       Final        Report Status   PENDING       Incomplete    Urine culture     Status: Abnormal        Collection Time: 04/04/16 11:08 AM    Result   Value   Ref Range   Status        Specimen Description   URINE, CLEAN CATCH       Final        Special Requests   NONE       Final  Culture   MULTIPLE SPECIES PRESENT, SUGGEST RECOLLECTION (A)       Final        Report Status   04/06/2016 FINAL       Final    MRSA PCR Screening     Status: None        Collection Time: 04/04/16 12:15 PM    Result   Value   Ref Range   Status        MRSA by PCR   NEGATIVE   NEGATIVE   Final            Comment:           The GeneXpert MRSA Assay (FDA  approved for NASAL specimens  only), is one component of a  comprehensive MRSA colonization  surveillance program. It is not  intended to diagnose MRSA  infection nor to guide or  monitor treatment for  MRSA infections.     Culture, sputum-assessment     Status: None        Collection Time: 04/05/16 11:50 PM    Result   Value   Ref Range   Status        Specimen Description   SPUTUM       Final        Special Requests   NONE       Final        Sputum evaluation           Final            THIS SPECIMEN IS ACCEPTABLE. RESPIRATORY CULTURE REPORT TO FOLLOW.        Report Status   04/06/2016 FINAL       Final    Culture, respiratory (NON-Expectorated)     Status: None (Preliminary result)        Collection Time: 04/05/16 11:50 PM    Result   Value   Ref Range   Status        Specimen Description   SPUTUM       Final        Special Requests   NONE       Final        Gram Stain            Final            FEW WBC PRESENT, PREDOMINANTLY PMN  RARE GRAM POSITIVE COCCI IN PAIRS AND CHAINS  RARE GRAM NEGATIVE COCCOBACILLI  RARE GRAM POSITIVE RODS         Culture           Final            NO GROWTH < 24 HOURS  Performed at [facility]         Report Status   PENDING       Incomplete          Studies/Results:   Imaging Results (Last 48 hours)          Medications:   Prior to Admission:           Prescriptions Prior to Admission    Medication   Sig   Dispense   Refill   Last Dose    .   albuterol (PROVENTIL HFA;VENTOLIN HFA) 108 (90 Base) MCG/ACT inhaler   Inhale 2 puffs into the lungs every 4 (four) hours as needed for wheezing or shortness of breath.  04/04/2016 at Unknown time    .   albuterol (PROVENTIL) (2.5 MG/3ML) 0.083% nebulizer solution   Take 3 mLs (2.5 mg total) by nebulization every 4 (four) hours as needed for wheezing or shortness of breath.   75 mL   12   04/04/2016 at Unknown time    .   aspirin EC 81 MG tablet   Take 81 mg by mouth daily.            04/03/2016 at Unknown time    .   atorvastatin (LIPITOR) 80 MG tablet   Take 1 tablet (80 mg total) by mouth daily.   90 tablet   3   04/03/2016 at Unknown time    .   benazepril (LOTENSIN) 5 MG tablet   Take 1 tablet (5 mg total) by mouth daily.   90 tablet   0   04/03/2016 at Unknown time    .   budesonide-formoterol (SYMBICORT) 160-4.5 MCG/ACT inhaler   Inhale 2 puffs into the lungs 2 (two) times daily.   3 Inhaler   3   04/03/2016 at Unknown time    .   clonazePAM (KLONOPIN) 0.5 MG tablet   TAKE 1 TABLET BY MOUTH TWICE DAILY AS NEEDED FOR ANXIETY   60 tablet   5   04/03/2016 at Unknown time    .   finasteride (PROSCAR) 5 MG tablet   Take 5 mg by mouth daily.       11   04/03/2016 at Unknown time    .   metoprolol succinate  (TOPROL-XL) 25 MG 24 hr tablet   TAKE 1/2 TABLET BY MOUTH 2 TIMES DAILY.   90 tablet   3   04/03/2016 at 2000    .   potassium chloride SA (K-DUR,KLOR-CON) 20 MEQ tablet   Take 1 tablet (20 mEq total) by mouth daily.   90 tablet   3   04/03/2016 at Unknown time    .   tiotropium (SPIRIVA) 18 MCG inhalation capsule   Place 18 mcg into inhaler and inhale daily.           04/03/2016 at Unknown time    .   torsemide (DEMADEX) 20 MG tablet   Take 2 tablets (40 mg total) by mouth daily.   180 tablet   1   04/03/2016 at Unknown time    .   HYDROcodone-acetaminophen (NORCO) 10-325 MG tablet   Take 0.5 tablets by mouth every 6 (six) hours as needed for severe pain.   60 tablet   0   unknown    .   nitroGLYCERIN (NITROSTAT) 0.4 MG SL tablet   Place 1 tablet (0.4 mg total) under the tongue every 5 (five) minutes as needed for chest pain.   100 tablet   0   unknown       Scheduled:   .   aspirin EC    81 mg   Oral   Daily    .   atorvastatin    80 mg   Oral   Daily    .   azithromycin    500 mg   Intravenous   Q24H    .   cefTRIAXone (ROCEPHIN)  IV    1 g   Intravenous   Q24H    .   chlorhexidine    15 mL   Mouth Rinse   BID    .   enoxaparin (LOVENOX) injection    40 mg  Subcutaneous   Q24H    .   feeding supplement (ENSURE ENLIVE)    237 mL   Oral   TID BM    .   finasteride    5 mg   Oral   Daily    .   guaiFENesin    1,200 mg   Oral   BID    .   ipratropium-albuterol    3 mL   Nebulization   Q6H    .   LORazepam    0.5 mg   Intravenous   Q6H    .   mouth rinse    15 mL   Mouth Rinse   q12n4p    .   methylPREDNISolone (SOLU-MEDROL) injection    60 mg   Intravenous   Q6H    .   mometasone-formoterol    2 puff   Inhalation   BID       Continuous:     [id number] **OR** acetaminophen,  albuterol, HYDROcodone-acetaminophen, ondansetron **OR** ondansetron (ZOFRAN) IV     Assesment: He was admitted with community-acquired pneumonia, sepsis, acute on chronic hypoxic respiratory failure, COPD exacerbation and he has had acute encephalopathy which is ongoing. He is continuing to have difficulty maintaining his oxygen saturation off BiPAP. He is on appropriate treatment for pneumonia and he is on steroids both for the pneumonia and for COPD exacerbation.  Principal Problem:    Sepsis ([place])  Active Problems:    Essential hypertension    CAD (coronary artery disease)    Anxiety    COPD with acute exacerbation ([facility])    Chronic pain syndrome    Acute on chronic respiratory failure with hypoxia ([facility])    Chronic systolic CHF (congestive heart failure) ([facility])    Hypotension    CAP (community acquired pneumonia)    Anemia    Thrombocytopenia ([facility])    Fever    Elevated troponin    Acute encephalopathy           Plan: Discussed with Dr. Roderic Palau I think we'll try to continue on BiPAP as needed to avoid intubation and mechanical ventilation if possible. Continue current treatments because I think he is on appropriate care. Institute Ativan to see if some of his problem is withdrawal from his chronic benzo at home. Start nicotine patch. Recheck blood gas in the morning. Recheck chest x-ray in the morning. He is high risk to need intubation and mechanical ventilation because of his severe COPD at baseline and his mental status         LOS: 3 days      Cynara Tatham L  04/07/2016, 9:17 AM

## 2016-04-08 NOTE — Progress Notes (Signed)
PT RESTED WELL THIS SHIFT. RERMAINED ON BIPAP THROUGH THE NIGHT.

## 2016-04-08 NOTE — Progress Notes (Signed)
Subjective: He is calm her this morning. He is still on BiPAP. He failed coming off BiPAP again. He has no complaints but I'm not sure how accurate that is. He denies any chest pain abdominal pain nausea vomiting but does admit that he is short of breath. My consult note yesterday was inadvertently labeled a progress note so I copied and pasted it to a consult note for accuracy  Objective: Vital signs in last 24 hours: Temp:  [97.7 F (36.5 C)-97.8 F (36.6 C)] 97.8 F (36.6 C) (12/03 0330) Pulse Rate:  [62-112] 84 (12/03 0817) Resp:  [0-26] 14 (12/03 0700) BP: (110-137)/(67-97) 122/71 (12/03 0817) SpO2:  [77 %-100 %] 93 % (12/03 0700) FiO2 (%):  [45 %] 45 % (12/03 0234) Weight:  [65.5 kg (144 lb 6.4 oz)] 65.5 kg (144 lb 6.4 oz) (12/03 0439) Weight change: 2 kg (4 lb 6.6 oz) Last BM Date: 04/04/16  Intake/Output from previous day: 12/02 0701 - 12/03 0700 In: 860 [P.O.:510; IV Piggyback:350] Out: 1175 [Urine:1175]  PHYSICAL EXAM General appearance: alert and Still confused but not as agitated on BiPAP Resp: rhonchi bilaterally Cardio: regular rate and rhythm, S1, S2 normal, no murmur, click, rub or gallop GI: soft, non-tender; bowel sounds normal; no masses,  no organomegaly Extremities: extremities normal, atraumatic, no cyanosis or edema Skin warm and dry. Mucous membranes slightly dry. Pupils are reactive. EOMI  Lab Results:  Results for orders placed or performed during the hospital encounter of 04/04/16 (from the past 48 hour(s))  Blood gas, arterial     Status: Abnormal   Collection Time: 04/07/16  2:17 AM  Result Value Ref Range   FIO2 0.45    O2 Content 45.0 L/min   Delivery systems BILEVEL POSITIVE AIRWAY PRESSURE    LHR 12.0 resp/min   Inspiratory PAP 16.0    Expiratory PAP 6.0    pH, Arterial 7.392 7.350 - 7.450   pCO2 arterial 60.9 (H) 32.0 - 48.0 mmHg   pO2, Arterial 86.2 83.0 - 108.0 mmHg   Bicarbonate 33.5 (H) 20.0 - 28.0 mmol/L   Acid-Base Excess 10.9 (H)  0.0 - 2.0 mmol/L   O2 Saturation 95.3 %   Collection site RIGHT RADIAL    Drawn by [id number]    Sample type ARTERIAL DRAW    Allens test (pass/fail) PASS PASS  Basic metabolic panel     Status: Abnormal   Collection Time: 04/07/16  4:36 AM  Result Value Ref Range   Sodium 143 135 - 145 mmol/L   Potassium 4.3 3.5 - 5.1 mmol/L   Chloride 104 101 - 111 mmol/L   CO2 37 (H) 22 - 32 mmol/L   Glucose, Bld 157 (H) 65 - 99 mg/dL   BUN 15 6 - 20 mg/dL   Creatinine, Ser 0.41 (L) 0.61 - 1.24 mg/dL   Calcium 9.8 8.9 - 10.3 mg/dL   GFR calc non Af Amer >60 >60 mL/min   GFR calc Af Amer >60 >60 mL/min    Comment: (NOTE) The eGFR has been calculated using the CKD EPI equation. This calculation has not been validated in all clinical situations. eGFR's persistently <60 mL/min signify possible Chronic Kidney Disease.    Anion gap 2 (L) 5 - 15    Comment: REPEATED TO VERIFY  CBC     Status: Abnormal   Collection Time: 04/07/16  4:36 AM  Result Value Ref Range   WBC 12.7 (H) 4.0 - 10.5 K/uL   RBC 3.75 (L) 4.22 - 5.81 MIL/uL  Hemoglobin 11.5 (L) 13.0 - 17.0 g/dL   HCT 37.5 (L) 39.0 - 52.0 %   MCV 100.0 78.0 - 100.0 fL   MCH 30.7 26.0 - 34.0 pg   MCHC 30.7 30.0 - 36.0 g/dL   RDW 13.3 11.5 - 15.5 %   Platelets 120 (L) 150 - 400 K/uL  Basic metabolic panel     Status: Abnormal   Collection Time: 04/08/16  4:28 AM  Result Value Ref Range   Sodium 141 135 - 145 mmol/L   Potassium 4.8 3.5 - 5.1 mmol/L   Chloride 100 (L) 101 - 111 mmol/L   CO2 38 (H) 22 - 32 mmol/L   Glucose, Bld 127 (H) 65 - 99 mg/dL   BUN 17 6 - 20 mg/dL   Creatinine, Ser 0.36 (L) 0.61 - 1.24 mg/dL   Calcium 9.9 8.9 - 10.3 mg/dL   GFR calc non Af Amer >60 >60 mL/min   GFR calc Af Amer >60 >60 mL/min    Comment: (NOTE) The eGFR has been calculated using the CKD EPI equation. This calculation has not been validated in all clinical situations. eGFR's persistently <60 mL/min signify possible Chronic Kidney Disease.    Anion  gap 3 (L) 5 - 15  CBC     Status: Abnormal   Collection Time: 04/08/16  4:28 AM  Result Value Ref Range   WBC 9.3 4.0 - 10.5 K/uL   RBC 3.86 (L) 4.22 - 5.81 MIL/uL   Hemoglobin 11.9 (L) 13.0 - 17.0 g/dL   HCT 39.1 39.0 - 52.0 %   MCV 101.3 (H) 78.0 - 100.0 fL   MCH 30.8 26.0 - 34.0 pg   MCHC 30.4 30.0 - 36.0 g/dL   RDW 13.3 11.5 - 15.5 %   Platelets 119 (L) 150 - 400 K/uL    Comment: SPECIMEN CHECKED FOR CLOTS PLATELET COUNT CONFIRMED BY SMEAR   Blood gas, arterial     Status: Abnormal   Collection Time: 04/08/16  5:00 AM  Result Value Ref Range   FIO2 45.00    Delivery systems BILEVEL POSITIVE AIRWAY PRESSURE    Inspiratory PAP 18.0    Expiratory PAP 8.0    pH, Arterial 7.339 (L) 7.350 - 7.450   pCO2 arterial 76.1 (HH) 32.0 - 48.0 mmHg    Comment: CRITICAL RESULT CALLED TO, READ BACK BY AND VERIFIED WITH: SMITH,F RN AT 0786 04/08/16 PITTMAN,S RRT    pO2, Arterial 99.6 83.0 - 108.0 mmHg   Bicarbonate 35.1 (H) 20.0 - 28.0 mmol/L   Acid-Base Excess 13.6 (H) 0.0 - 2.0 mmol/L   O2 Saturation 96.0 %   Patient temperature 37.0    Collection site RIGHT RADIAL    Drawn by [id number]    Sample type ARTERIAL    Allens test (pass/fail) PASS PASS    ABGS  Recent Labs  04/08/16 0500  PHART 7.339*  PO2ART 99.6  HCO3 35.1*   CULTURES Recent Results (from the past 240 hour(s))  Blood culture (routine x 2)     Status: None (Preliminary result)   Collection Time: 04/04/16  8:32 AM  Result Value Ref Range Status   Specimen Description BLOOD LEFT HAND  Final   Special Requests   Final    BOTTLES DRAWN AEROBIC AND ANAEROBIC AEB=10CC ANA=8CC   Culture NO GROWTH 2 DAYS  Final   Report Status PENDING  Incomplete  Blood culture (routine x 2)     Status: None (Preliminary result)   Collection Time: 04/04/16  8:67 AM  Result Value Ref Range Status   Specimen Description BLOOD RIGHT HAND  Final   Special Requests BOTTLES DRAWN AEROBIC AND ANAEROBIC 10CC ANA=8CC  Final   Culture NO GROWTH  2 DAYS  Final   Report Status PENDING  Incomplete  Urine culture     Status: Abnormal   Collection Time: 04/04/16 11:08 AM  Result Value Ref Range Status   Specimen Description URINE, CLEAN CATCH  Final   Special Requests NONE  Final   Culture MULTIPLE SPECIES PRESENT, SUGGEST RECOLLECTION (A)  Final   Report Status 04/06/2016 FINAL  Final  MRSA PCR Screening     Status: None   Collection Time: 04/04/16 12:15 PM  Result Value Ref Range Status   MRSA by PCR NEGATIVE NEGATIVE Final    Comment:        The GeneXpert MRSA Assay (FDA approved for NASAL specimens only), is one component of a comprehensive MRSA colonization surveillance program. It is not intended to diagnose MRSA infection nor to guide or monitor treatment for MRSA infections.   Culture, sputum-assessment     Status: None   Collection Time: 04/05/16 11:50 PM  Result Value Ref Range Status   Specimen Description SPUTUM  Final   Special Requests NONE  Final   Sputum evaluation   Final    THIS SPECIMEN IS ACCEPTABLE. RESPIRATORY CULTURE REPORT TO FOLLOW.   Report Status 04/06/2016 FINAL  Final  Culture, respiratory (NON-Expectorated)     Status: None (Preliminary result)   Collection Time: 04/05/16 11:50 PM  Result Value Ref Range Status   Specimen Description SPUTUM  Final   Special Requests NONE  Final   Gram Stain   Final    FEW WBC PRESENT, PREDOMINANTLY PMN RARE GRAM POSITIVE COCCI IN PAIRS AND CHAINS RARE GRAM NEGATIVE COCCOBACILLI RARE GRAM POSITIVE RODS    Culture   Final    CULTURE REINCUBATED FOR BETTER GROWTH Performed at [facility]    Report Status PENDING  Incomplete   Studies/Results: Dg Chest Port 1 View  Result Date: 04/08/2016 CLINICAL DATA:  Shortness of breath. EXAM: PORTABLE CHEST 1 VIEW COMPARISON:  April 04, 2016 FINDINGS: Persistent infiltrate in the left lung, mildly improved in the interval. Stable cardiomegaly. Skin fold over the lateral right chest. No other changes.  IMPRESSION: Mildly improved left-sided infiltrate. Recommend follow-up to resolution. Electronically Signed   By: Dorise Bullion III M.D   On: 04/08/2016 09:12    Medications:  Prior to Admission:  Prescriptions Prior to Admission  Medication Sig Dispense Refill Last Dose  . albuterol (PROVENTIL HFA;VENTOLIN HFA) 108 (90 Base) MCG/ACT inhaler Inhale 2 puffs into the lungs every 4 (four) hours as needed for wheezing or shortness of breath.   04/04/2016 at Unknown time  . albuterol (PROVENTIL) (2.5 MG/3ML) 0.083% nebulizer solution Take 3 mLs (2.5 mg total) by nebulization every 4 (four) hours as needed for wheezing or shortness of breath. 75 mL 12 04/04/2016 at Unknown time  . aspirin EC 81 MG tablet Take 81 mg by mouth daily.    04/03/2016 at Unknown time  . atorvastatin (LIPITOR) 80 MG tablet Take 1 tablet (80 mg total) by mouth daily. 90 tablet 3 04/03/2016 at Unknown time  . benazepril (LOTENSIN) 5 MG tablet Take 1 tablet (5 mg total) by mouth daily. 90 tablet 0 04/03/2016 at Unknown time  . budesonide-formoterol (SYMBICORT) 160-4.5 MCG/ACT inhaler Inhale 2 puffs into the lungs 2 (two) times daily. 3 Inhaler 3 04/03/2016  at Unknown time  . clonazePAM (KLONOPIN) 0.5 MG tablet TAKE 1 TABLET BY MOUTH TWICE DAILY AS NEEDED FOR ANXIETY 60 tablet 5 04/03/2016 at Unknown time  . finasteride (PROSCAR) 5 MG tablet Take 5 mg by mouth daily.  11 04/03/2016 at Unknown time  . metoprolol succinate (TOPROL-XL) 25 MG 24 hr tablet TAKE 1/2 TABLET BY MOUTH 2 TIMES DAILY. 90 tablet 3 04/03/2016 at 2000  . potassium chloride SA (K-DUR,KLOR-CON) 20 MEQ tablet Take 1 tablet (20 mEq total) by mouth daily. 90 tablet 3 04/03/2016 at Unknown time  . tiotropium (SPIRIVA) 18 MCG inhalation capsule Place 18 mcg into inhaler and inhale daily.   04/03/2016 at Unknown time  . torsemide (DEMADEX) 20 MG tablet Take 2 tablets (40 mg total) by mouth daily. 180 tablet 1 04/03/2016 at Unknown time  . HYDROcodone-acetaminophen  (NORCO) 10-325 MG tablet Take 0.5 tablets by mouth every 6 (six) hours as needed for severe pain. 60 tablet 0 unknown  . nitroGLYCERIN (NITROSTAT) 0.4 MG SL tablet Place 1 tablet (0.4 mg total) under the tongue every 5 (five) minutes as needed for chest pain. 100 tablet 0 unknown   Scheduled: . aspirin EC  81 mg Oral Daily  . atorvastatin  80 mg Oral Daily  . azithromycin  500 mg Intravenous Q24H  . cefTRIAXone (ROCEPHIN)  IV  1 g Intravenous Q24H  . chlorhexidine  15 mL Mouth Rinse BID  . enoxaparin (LOVENOX) injection  40 mg Subcutaneous Q24H  . feeding supplement (ENSURE ENLIVE)  237 mL Oral TID BM  . finasteride  5 mg Oral Daily  . guaiFENesin  1,200 mg Oral BID  . ipratropium-albuterol  3 mL Nebulization Q6H  . LORazepam  0.5 mg Intravenous Q6H  . mouth rinse  15 mL Mouth Rinse q12n4p  . methylPREDNISolone (SOLU-MEDROL) injection  60 mg Intravenous Q6H  . metoprolol succinate  12.5 mg Oral BID  . mometasone-formoterol  2 puff Inhalation BID  . nicotine  21 mg Transdermal Daily   Continuous:  PYK:[id number] **OR** acetaminophen, albuterol, HYDROcodone-acetaminophen, ondansetron **OR** ondansetron (ZOFRAN) IV  Assesment: He was admitted with sepsis, community-acquired pneumonia and acute on chronic hypoxic/hypercapnic respiratory failure. He also has chronic systolic heart failure but that does not seem to be playing much of an element with this. His troponin level was elevated and I start to be due to demand ischemia. He had severe encephalopathy and that's better. I think that's multifactorial and includes his acute illness and perhaps some withdrawal from medication  He is still having episodes of hypercapnia still requiring BiPAP and still at high risk of needing to be intubated and ventilated later.  His encephalopathy is better with nicotine replacement and Ativan but he is still confused.  Pneumonia seems to be improving by exam  He is no longer septic Principal  Problem:   Sepsis ([place]) Active Problems:   Essential hypertension   CAD (coronary artery disease)   Anxiety   COPD with acute exacerbation (HCC)   Chronic pain syndrome   Acute on chronic respiratory failure with hypoxia (HCC)   Chronic systolic CHF (congestive heart failure) (HCC)   Hypotension   CAP (community acquired pneumonia)   Anemia   Thrombocytopenia (HCC)   Fever   Elevated troponin   Acute encephalopathy    Plan: Continue current treatments. Watch clinically to see if he needs another blood gas. If he becomes somnolent would need another blood gas and I would suspect that if he becomes somnolent on BiPAP  he's going to require intubation and mechanical ventilation. Discussed with family at bedside    LOS: 4 days   Adeli Frost L 04/08/2016, 9:18 AM

## 2016-04-08 NOTE — Plan of Care (Signed)
Problem: Consults Goal: Skin Care Protocol Initiated - if Braden Score 18 or less If consults are not indicated, leave blank or document N/A  Outcome: Progressing Pink sacral foam to sacrum. Goal: Nutrition Consult-if indicated Outcome: Not Progressing Poor po intake.

## 2016-04-08 NOTE — Plan of Care (Signed)
Problem: Consults Goal: Respiratory Problems Patient Education See Patient Education Module for education specifics.  Outcome: Not Progressing Pt is now on continuous bipap to support resp status

## 2016-04-09 ENCOUNTER — Inpatient Hospital Stay (HOSPITAL_COMMUNITY): Payer: 59

## 2016-04-09 ENCOUNTER — Encounter (HOSPITAL_COMMUNITY): Payer: Self-pay | Admitting: Primary Care

## 2016-04-09 DIAGNOSIS — Z7189 Other specified counseling: Secondary | ICD-10-CM

## 2016-04-09 DIAGNOSIS — I255 Ischemic cardiomyopathy: Secondary | ICD-10-CM

## 2016-04-09 DIAGNOSIS — I10 Essential (primary) hypertension: Secondary | ICD-10-CM

## 2016-04-09 DIAGNOSIS — Z515 Encounter for palliative care: Secondary | ICD-10-CM

## 2016-04-09 DIAGNOSIS — A419 Sepsis, unspecified organism: Principal | ICD-10-CM

## 2016-04-09 DIAGNOSIS — R748 Abnormal levels of other serum enzymes: Secondary | ICD-10-CM

## 2016-04-09 LAB — CBC
HCT: 41 % (ref 39.0–52.0)
Hemoglobin: 12.6 g/dL — ABNORMAL LOW (ref 13.0–17.0)
MCH: 30.7 pg (ref 26.0–34.0)
MCHC: 30.7 g/dL (ref 30.0–36.0)
MCV: 100 fL (ref 78.0–100.0)
PLATELETS: 134 10*3/uL — AB (ref 150–400)
RBC: 4.1 MIL/uL — ABNORMAL LOW (ref 4.22–5.81)
RDW: 12.9 % (ref 11.5–15.5)
WBC: 7.9 10*3/uL (ref 4.0–10.5)

## 2016-04-09 LAB — ECHOCARDIOGRAM COMPLETE
CHL CUP STROKE VOLUME: 53 mL
E/e' ratio: 27.55
EWDT: 120 ms
FS: 5 % — AB (ref 28–44)
HEIGHTINCHES: 67 in
IVS/LV PW RATIO, ED: 0.97
LA ID, A-P, ES: 54 mm
LA diam end sys: 54 mm
LA vol A4C: 98.1 ml
LA vol index: 58.5 mL/m2
LADIAMINDEX: 3.07 cm/m2
LAVOL: 103 mL
LV E/e' medial: 27.55
LV E/e'average: 27.55
LV PW d: 11.5 mm — AB (ref 0.6–1.1)
LV sys vol: 171 mL — AB (ref 21–61)
LVDIAVOL: 224 mL — AB (ref 62–150)
LVDIAVOLIN: 127 mL/m2
LVOT SV: 59 mL
LVOT VTI: 20.7 cm
LVOT area: 2.84 cm2
LVOT peak grad rest: 4 mmHg
LVOT peak vel: 99.8 cm/s
LVOTD: 19 mm
LVSYSVOLIN: 97 mL/m2
MV Dec: 120
MV Peak grad: 7 mmHg
MVPKEVEL: 135 m/s
RV LATERAL S' VELOCITY: 8.7 cm/s
RV TAPSE: 18.7 mm
Simpson's disk: 24
TDI e' medial: 4.9
WEIGHTICAEL: 2303.37 [oz_av]

## 2016-04-09 LAB — BASIC METABOLIC PANEL
ANION GAP: 5 (ref 5–15)
BUN: 17 mg/dL (ref 6–20)
CALCIUM: 9.7 mg/dL (ref 8.9–10.3)
CO2: 39 mmol/L — ABNORMAL HIGH (ref 22–32)
Chloride: 97 mmol/L — ABNORMAL LOW (ref 101–111)
Creatinine, Ser: 0.37 mg/dL — ABNORMAL LOW (ref 0.61–1.24)
Glucose, Bld: 169 mg/dL — ABNORMAL HIGH (ref 65–99)
Potassium: 4.5 mmol/L (ref 3.5–5.1)
SODIUM: 141 mmol/L (ref 135–145)

## 2016-04-09 LAB — BLOOD GAS, ARTERIAL
ACID-BASE EXCESS: 12.6 mmol/L — AB (ref 0.0–2.0)
BICARBONATE: 34.4 mmol/L — AB (ref 20.0–28.0)
DRAWN BY: 277331
O2 Content: 9 L/min
O2 Saturation: 97.1 %
PATIENT TEMPERATURE: 37
pCO2 arterial: 66.7 mmHg (ref 32.0–48.0)
pH, Arterial: 7.376 (ref 7.350–7.450)
pO2, Arterial: 125 mmHg — ABNORMAL HIGH (ref 83.0–108.0)

## 2016-04-09 MED ORDER — LORAZEPAM 2 MG/ML IJ SOLN
0.5000 mg | Freq: Four times a day (QID) | INTRAMUSCULAR | Status: DC | PRN
Start: 2016-04-09 — End: 2016-04-17
  Administered 2016-04-10 – 2016-04-17 (×4): 0.5 mg via INTRAVENOUS
  Filled 2016-04-09 (×4): qty 1

## 2016-04-09 MED ORDER — LOSARTAN POTASSIUM 25 MG PO TABS
12.5000 mg | ORAL_TABLET | Freq: Every day | ORAL | Status: DC
  Administered 2016-04-09 – 2016-04-13 (×5): 12.5 mg via ORAL
  Filled 2016-04-09 (×8): qty 0.5

## 2016-04-09 NOTE — Progress Notes (Signed)
Patient Name: Tommy Turner Date of Encounter: 04/09/2016  Primary Cardiologist: Carlyle Dolly MD  Hospital Problem List     Principal Problem:   Sepsis [facility]) Active Problems:   Essential hypertension   CAD (coronary artery disease)   Anxiety   COPD with acute exacerbation (HCC)   Chronic pain syndrome   Acute on chronic respiratory failure with hypoxia (HCC)   Chronic systolic CHF (congestive heart failure) (HCC)   Hypotension   CAP (community acquired pneumonia)   Anemia   Thrombocytopenia (HCC)   Fever   Elevated troponin   Acute encephalopathy    Subjective   Confused, responsive, not oriented to place but recognizes his wife at bedside. Denies chest pain, dyspnea.  Inpatient Medications    Scheduled Meds: . aspirin EC  81 mg Oral Daily  . atorvastatin  80 mg Oral Daily  . azithromycin  500 mg Intravenous Q24H  . cefTRIAXone (ROCEPHIN)  IV  1 g Intravenous Q24H  . chlorhexidine  15 mL Mouth Rinse BID  . enoxaparin (LOVENOX) injection  40 mg Subcutaneous Q24H  . feeding supplement (ENSURE ENLIVE)  237 mL Oral TID BM  . finasteride  5 mg Oral Daily  . guaiFENesin  1,200 mg Oral BID  . ipratropium-albuterol  3 mL Nebulization Q6H  . LORazepam  0.5 mg Intravenous Q6H  . mouth rinse  15 mL Mouth Rinse q12n4p  . methylPREDNISolone (SOLU-MEDROL) injection  60 mg Intravenous Q6H  . metoprolol succinate  12.5 mg Oral BID  . mometasone-formoterol  2 puff Inhalation BID  . nicotine  21 mg Transdermal Daily    Vital Signs    Vitals:   04/09/16 0545 04/09/16 0600 04/09/16 0711 04/09/16 0732  BP:  140/81    Pulse: 93 98 95   Resp: (!) 22 (!) 8    Temp:   97.9 F (36.6 C)   TempSrc:   Axillary   SpO2: 99% 99% 96% 97%  Weight:      Height:        Intake/Output Summary (Last 24 hours) at 04/09/16 0953 Last data filed at 04/09/16 0500  Gross per 24 hour  Intake              240 ml  Output              325 ml  Net              -85 ml   Filed Weights     04/07/16 0400 04/08/16 0439 04/09/16 0500  Weight: 140 lb 6.9 oz (63.7 kg) 144 lb 6.4 oz (65.5 kg) 143 lb 15.4 oz (65.3 kg)    Physical Exam    GEN:  No acute distress.  Neck: Supple, no JVD, carotid bruits, or masses. Cardiac: RRR, irregular, 1/6 systolic murmur, , rubs, or gallops. No clubbing, cyanosis, edema.  Radials/DP/PT 2+ and equal bilaterally.  Respiratory:  Clear on the right, crackles on the left, especially at the base, wearing O2. GI: Normal bowel sounds,  Psych: Confused, responsive, not oriented to place and time, but recognizes his wife.   Labs    CBC  Recent Labs  04/08/16 0428 04/09/16 0403  WBC 9.3 7.9  HGB 11.9* 12.6*  HCT 39.1 41.0  MCV 101.3* 100.0  PLT 119* [id number]*   Basic Metabolic Panel  Recent Labs  04/08/16 0428 04/09/16 0403  NA 141 141  K 4.8 4.5  CL 100* 97*  CO2 38* 39*  GLUCOSE 127* 169*  BUN 17 17  CREATININE 0.36* 0.37*  CALCIUM 9.9 9.7     Telemetry    SR with Intraventricular conduction delay. Frequent PvC's  ECG    Tracing from 04/07/2016 shows sinus rhythm with left bundle branch block- personally reviewed.  Radiology    Dg Chest Port 1 View  Result Date: 04/08/2016 CLINICAL DATA:  Shortness of breath. EXAM: PORTABLE CHEST 1 VIEW COMPARISON:  April 04, 2016 FINDINGS: Persistent infiltrate in the left lung, mildly improved in the interval. Stable cardiomegaly. Skin fold over the lateral right chest. No other changes. IMPRESSION: Mildly improved left-sided infiltrate. Recommend follow-up to resolution. Electronically Signed   By: Dorise Bullion III M.D   On: 04/08/2016 09:12    Cardiac Studies   Echocardiogram 05/03/2015 Left ventricle: LVEF is approximately 20 to 25% with severe   hypokinesis of the lateral wall; akinenesis of the distal   lateral, distal septal, apical, mid/distal inferior , distal   anteior walls False tendon seen in LV. The cavity size was   normal. Wall thickness was normal. Doppler  parameters are   consistent with abnormal left ventricular relaxation (grade 1   diastolic dysfunction). - Mitral valve: Calcified annulus. Mildly thickened leaflets . - Left atrium: The atrium was moderately to severely dilated. - Right ventricle: The cavity size was mildly dilated. Systolic   function was moderately reduced. - Right atrium: The atrium was moderately dilated.  Patient Profile     [id number] with CAD s/p MI/CABG [id number], chronic systolic CHF/ICM (pt not interested in ICD), RV dysfunction as well, AAA repair 2016, chronic urinary retention, severe COPD with chronic respiratory failure on home O2, HTN (more recently with OP hypotension limiting medication therapy), HLD, tobacco abuse presented with a/c hypoxia, tachycardia, hypotension, fever, WBC 23k, felt to have sepsis related to CAP. Cardiology consulted for mildly elevated troponin and hypotension. Last echo 04/2015: EF 20-25% with multiple WMA, grade 1 DD, mod-sev LAE, mod reduced RVF, mod RAE.  Assessment & Plan    1. Sepsis felt due to CAP +/- AECOPD - per primary team. Continues on antibiotics, steroids, nebs.  2. Hypotension - Improved, systolics running in the [id number] and higher most recently.  3. History of ischemic cardiomyopathy with chronic systolic heart failure. LVEF 20-25% range by echocardiogram last year. Has been on minimal medical regimen, limited by hypotension.  4. Minimally elevated troponin (with history of known CAD and nonischemic nuc 2015) - per notes, felt c/w demand ischemia due to sepsis. Continue ASA and statin.  5. PVCs/brief NSVT: Magnesium low yesterday. Repleted with IV bolus. Continues intermittent ventricular ectopy.   Signed, Jory Sims, NP  04/09/2016, 9:53 AM   Attending note:  Patient seen and examined. Reviewed hospital course and prior recommendations by Dr. Bronson Ing. Modified above note by Ms. Lawrence NP. Mr. Eliopoulos has been confused recently, still with evidence of hypoxic  respiratory failure and requiring supportive treatment. His blood pressure trend has increased allowing room for further optimization of cardiac medical therapy. He is now on Toprol-XL, aspirin, and Lipitor. Lab work shows creatinine 0.4, potassium 4.5, hemoglobin 12.6, platelets 134. As an outpatient he had been on both Lotensin and Toprol-XL. We will try and initiate low-dose Cozaar at this time and also arrange a follow-up echocardiogram.  Satira Sark, M.D., F.A.C.C.

## 2016-04-09 NOTE — Progress Notes (Signed)
*  PRELIMINARY RESULTS* Echocardiogram 2D Echocardiogram has been performed.  Samuel Germany 04/09/2016, 4:02 PM

## 2016-04-09 NOTE — Progress Notes (Signed)
Subjective: He is on nasal cannula and doing okay with that now. He says he feels better but he still obviously confused. No other new problems have been noted overnight.  Objective: Vital signs in last 24 hours: Temp:  [97.9 F (36.6 C)-98.3 F (36.8 C)] 97.9 F (36.6 C) (12/04 0711) Pulse Rate:  [65-99] 95 (12/04 0711) Resp:  [0-24] 8 (12/04 0600) BP: (104-140)/(56-89) 140/81 (12/04 0600) SpO2:  [88 %-100 %] 97 % (12/04 0732) FiO2 (%):  [45 %] 45 % (12/04 0250) Weight:  [65.3 kg (143 lb 15.4 oz)] 65.3 kg (143 lb 15.4 oz) (12/04 0500) Weight change: -0.2 kg (-7.1 oz) Last BM Date: 04/04/16  Intake/Output from previous day: 12/03 0701 - 12/04 0700 In: 790 [P.O.:490; IV Piggyback:300] Out: 675 [Urine:675]  PHYSICAL EXAM General appearance: alert, mild distress and Confused on nasal cannula Resp: clear to auscultation bilaterally Cardio: regular rate and rhythm, S1, S2 normal, no murmur, click, rub or gallop GI: soft, non-tender; bowel sounds normal; no masses,  no organomegaly Extremities: extremities normal, atraumatic, no cyanosis or edema Pupils reactive. EOMI. Mucous membranes are moist. Skin warm and dry  Lab Results:  Results for orders placed or performed during the hospital encounter of 04/04/16 (from the past 48 hour(s))  Basic metabolic panel     Status: Abnormal   Collection Time: 04/08/16  4:28 AM  Result Value Ref Range   Sodium 141 135 - 145 mmol/L   Potassium 4.8 3.5 - 5.1 mmol/L   Chloride 100 (L) 101 - 111 mmol/L   CO2 38 (H) 22 - 32 mmol/L   Glucose, Bld 127 (H) 65 - 99 mg/dL   BUN 17 6 - 20 mg/dL   Creatinine, Ser 0.36 (L) 0.61 - 1.24 mg/dL   Calcium 9.9 8.9 - 10.3 mg/dL   GFR calc non Af Amer >60 >60 mL/min   GFR calc Af Amer >60 >60 mL/min    Comment: (NOTE) The eGFR has been calculated using the CKD EPI equation. This calculation has not been validated in all clinical situations. eGFR's persistently <60 mL/min signify possible Chronic  Kidney Disease.    Anion gap 3 (L) 5 - 15  CBC     Status: Abnormal   Collection Time: 04/08/16  4:28 AM  Result Value Ref Range   WBC 9.3 4.0 - 10.5 K/uL   RBC 3.86 (L) 4.22 - 5.81 MIL/uL   Hemoglobin 11.9 (L) 13.0 - 17.0 g/dL   HCT 39.1 39.0 - 52.0 %   MCV 101.3 (H) 78.0 - 100.0 fL   MCH 30.8 26.0 - 34.0 pg   MCHC 30.4 30.0 - 36.0 g/dL   RDW 13.3 11.5 - 15.5 %   Platelets 119 (L) 150 - 400 K/uL    Comment: SPECIMEN CHECKED FOR CLOTS PLATELET COUNT CONFIRMED BY SMEAR   Blood gas, arterial     Status: Abnormal   Collection Time: 04/08/16  5:00 AM  Result Value Ref Range   FIO2 45.00    Delivery systems BILEVEL POSITIVE AIRWAY PRESSURE    Inspiratory PAP 18.0    Expiratory PAP 8.0    pH, Arterial 7.339 (L) 7.350 - 7.450   pCO2 arterial 76.1 (HH) 32.0 - 48.0 mmHg    Comment: CRITICAL RESULT CALLED TO, READ BACK BY AND VERIFIED WITH: SMITH,F RN AT 0549 04/08/16 PITTMAN,S RRT    pO2, Arterial 99.6 83.0 - 108.0 mmHg   Bicarbonate 35.1 (H) 20.0 - 28.0 mmol/L   Acid-Base Excess 13.6 (H) 0.0 -  2.0 mmol/L   O2 Saturation 96.0 %   Patient temperature 37.0    Collection site RIGHT RADIAL    Drawn by [id number]    Sample type ARTERIAL    Allens test (pass/fail) PASS PASS  CBC     Status: Abnormal   Collection Time: 04/09/16  4:03 AM  Result Value Ref Range   WBC 7.9 4.0 - 10.5 K/uL   RBC 4.10 (L) 4.22 - 5.81 MIL/uL   Hemoglobin 12.6 (L) 13.0 - 17.0 g/dL   HCT 41.0 39.0 - 52.0 %   MCV 100.0 78.0 - 100.0 fL   MCH 30.7 26.0 - 34.0 pg   MCHC 30.7 30.0 - 36.0 g/dL   RDW 12.9 11.5 - 15.5 %   Platelets 134 (L) 150 - 400 K/uL  Basic metabolic panel     Status: Abnormal   Collection Time: 04/09/16  4:03 AM  Result Value Ref Range   Sodium 141 135 - 145 mmol/L   Potassium 4.5 3.5 - 5.1 mmol/L   Chloride 97 (L) 101 - 111 mmol/L   CO2 39 (H) 22 - 32 mmol/L   Glucose, Bld 169 (H) 65 - 99 mg/dL   BUN 17 6 - 20 mg/dL   Creatinine, Ser 0.37 (L) 0.61 - 1.24 mg/dL   Calcium 9.7 8.9 - 10.3  mg/dL   GFR calc non Af Amer >60 >60 mL/min   GFR calc Af Amer >60 >60 mL/min    Comment: (NOTE) The eGFR has been calculated using the CKD EPI equation. This calculation has not been validated in all clinical situations. eGFR's persistently <60 mL/min signify possible Chronic Kidney Disease.    Anion gap 5 5 - 15    ABGS  Recent Labs  04/08/16 0500  PHART 7.339*  PO2ART 99.6  HCO3 35.1*   CULTURES Recent Results (from the past 240 hour(s))  Blood culture (routine x 2)     Status: None (Preliminary result)   Collection Time: 04/04/16  8:32 AM  Result Value Ref Range Status   Specimen Description BLOOD LEFT HAND  Final   Special Requests   Final    BOTTLES DRAWN AEROBIC AND ANAEROBIC AEB=10CC ANA=8CC   Culture NO GROWTH 2 DAYS  Final   Report Status PENDING  Incomplete  Blood culture (routine x 2)     Status: None (Preliminary result)   Collection Time: 04/04/16  8:37 AM  Result Value Ref Range Status   Specimen Description BLOOD RIGHT HAND  Final   Special Requests BOTTLES DRAWN AEROBIC AND ANAEROBIC 10CC ANA=8CC  Final   Culture NO GROWTH 2 DAYS  Final   Report Status PENDING  Incomplete  Urine culture     Status: Abnormal   Collection Time: 04/04/16 11:08 AM  Result Value Ref Range Status   Specimen Description URINE, CLEAN CATCH  Final   Special Requests NONE  Final   Culture MULTIPLE SPECIES PRESENT, SUGGEST RECOLLECTION (A)  Final   Report Status 04/06/2016 FINAL  Final  MRSA PCR Screening     Status: None   Collection Time: 04/04/16 12:15 PM  Result Value Ref Range Status   MRSA by PCR NEGATIVE NEGATIVE Final    Comment:        The GeneXpert MRSA Assay (FDA approved for NASAL specimens only), is one component of a comprehensive MRSA colonization surveillance program. It is not intended to diagnose MRSA infection nor to guide or monitor treatment for MRSA infections.   Culture, sputum-assessment  Status: None   Collection Time: 04/05/16 11:50 PM   Result Value Ref Range Status   Specimen Description SPUTUM  Final   Special Requests NONE  Final   Sputum evaluation   Final    THIS SPECIMEN IS ACCEPTABLE. RESPIRATORY CULTURE REPORT TO FOLLOW.   Report Status 04/06/2016 FINAL  Final  Culture, respiratory (NON-Expectorated)     Status: None   Collection Time: 04/05/16 11:50 PM  Result Value Ref Range Status   Specimen Description SPUTUM  Final   Special Requests NONE  Final   Gram Stain   Final    FEW WBC PRESENT, PREDOMINANTLY PMN RARE GRAM POSITIVE COCCI IN PAIRS AND CHAINS RARE GRAM NEGATIVE COCCOBACILLI RARE GRAM POSITIVE RODS    Culture   Final    Consistent with normal respiratory flora. Performed at [facility]    Report Status 04/08/2016 FINAL  Final   Studies/Results: Dg Chest Port 1 View  Result Date: 04/08/2016 CLINICAL DATA:  Shortness of breath. EXAM: PORTABLE CHEST 1 VIEW COMPARISON:  April 04, 2016 FINDINGS: Persistent infiltrate in the left lung, mildly improved in the interval. Stable cardiomegaly. Skin fold over the lateral right chest. No other changes. IMPRESSION: Mildly improved left-sided infiltrate. Recommend follow-up to resolution. Electronically Signed   By: Dorise Bullion III M.D   On: 04/08/2016 09:12    Medications:  Prior to Admission:  Prescriptions Prior to Admission  Medication Sig Dispense Refill Last Dose  . albuterol (PROVENTIL HFA;VENTOLIN HFA) 108 (90 Base) MCG/ACT inhaler Inhale 2 puffs into the lungs every 4 (four) hours as needed for wheezing or shortness of breath.   04/04/2016 at Unknown time  . albuterol (PROVENTIL) (2.5 MG/3ML) 0.083% nebulizer solution Take 3 mLs (2.5 mg total) by nebulization every 4 (four) hours as needed for wheezing or shortness of breath. 75 mL 12 04/04/2016 at Unknown time  . aspirin EC 81 MG tablet Take 81 mg by mouth daily.    04/03/2016 at Unknown time  . atorvastatin (LIPITOR) 80 MG tablet Take 1 tablet (80 mg total) by mouth daily. 90 tablet  3 04/03/2016 at Unknown time  . benazepril (LOTENSIN) 5 MG tablet Take 1 tablet (5 mg total) by mouth daily. 90 tablet 0 04/03/2016 at Unknown time  . budesonide-formoterol (SYMBICORT) 160-4.5 MCG/ACT inhaler Inhale 2 puffs into the lungs 2 (two) times daily. 3 Inhaler 3 04/03/2016 at Unknown time  . clonazePAM (KLONOPIN) 0.5 MG tablet TAKE 1 TABLET BY MOUTH TWICE DAILY AS NEEDED FOR ANXIETY 60 tablet 5 04/03/2016 at Unknown time  . finasteride (PROSCAR) 5 MG tablet Take 5 mg by mouth daily.  11 04/03/2016 at Unknown time  . metoprolol succinate (TOPROL-XL) 25 MG 24 hr tablet TAKE 1/2 TABLET BY MOUTH 2 TIMES DAILY. 90 tablet 3 04/03/2016 at 2000  . potassium chloride SA (K-DUR,KLOR-CON) 20 MEQ tablet Take 1 tablet (20 mEq total) by mouth daily. 90 tablet 3 04/03/2016 at Unknown time  . tiotropium (SPIRIVA) 18 MCG inhalation capsule Place 18 mcg into inhaler and inhale daily.   04/03/2016 at Unknown time  . torsemide (DEMADEX) 20 MG tablet Take 2 tablets (40 mg total) by mouth daily. 180 tablet 1 04/03/2016 at Unknown time  . HYDROcodone-acetaminophen (NORCO) 10-325 MG tablet Take 0.5 tablets by mouth every 6 (six) hours as needed for severe pain. 60 tablet 0 unknown  . nitroGLYCERIN (NITROSTAT) 0.4 MG SL tablet Place 1 tablet (0.4 mg total) under the tongue every 5 (five) minutes as needed for chest  pain. 100 tablet 0 unknown   Scheduled: . aspirin EC  81 mg Oral Daily  . atorvastatin  80 mg Oral Daily  . azithromycin  500 mg Intravenous Q24H  . cefTRIAXone (ROCEPHIN)  IV  1 g Intravenous Q24H  . chlorhexidine  15 mL Mouth Rinse BID  . enoxaparin (LOVENOX) injection  40 mg Subcutaneous Q24H  . feeding supplement (ENSURE ENLIVE)  237 mL Oral TID BM  . finasteride  5 mg Oral Daily  . guaiFENesin  1,200 mg Oral BID  . ipratropium-albuterol  3 mL Nebulization Q6H  . LORazepam  0.5 mg Intravenous Q6H  . mouth rinse  15 mL Mouth Rinse q12n4p  . methylPREDNISolone (SOLU-MEDROL) injection  60 mg  Intravenous Q6H  . metoprolol succinate  12.5 mg Oral BID  . mometasone-formoterol  2 puff Inhalation BID  . nicotine  21 mg Transdermal Daily   Continuous:  OMV:[id number] **OR** acetaminophen, albuterol, HYDROcodone-acetaminophen, ondansetron **OR** ondansetron (ZOFRAN) IV  Assesment: He was admitted with community-acquired pneumonia and sepsis from that. He has acute on chronic hypoxic and hypercapnic respiratory failure and has been on BiPAP. He had not tolerated nasal cannula but is doing so this morning. He's had acute encephalopathy likely metabolic and perhaps also related to medication withdrawal. At baseline he has COPD. He has chronic systolic heart failure. He is improving. Principal Problem:   Sepsis ([place]) Active Problems:   Essential hypertension   CAD (coronary artery disease)   Anxiety   COPD with acute exacerbation (HCC)   Chronic pain syndrome   Acute on chronic respiratory failure with hypoxia (HCC)   Chronic systolic CHF (congestive heart failure) (HCC)   Hypotension   CAP (community acquired pneumonia)   Anemia   Thrombocytopenia (HCC)   Fever   Elevated troponin   Acute encephalopathy    Plan: Continue current treatments. No change in medications. See how he does on nasal cannula for a longer period of time.    LOS: 5 days   Hoa Deriso L 04/09/2016, 7:44 AM

## 2016-04-09 NOTE — Plan of Care (Signed)
Family meeting scheduled for 12/5 at 1000.

## 2016-04-09 NOTE — Progress Notes (Signed)
PROGRESS NOTE    Tommy Turner  [id number] DOB: 12/10/1940 DOA: 04/04/2016 PCP: Sallee Lange, MD    Brief Narrative:  75 y.o. male with medical history significant of AAA, anxiety, CAD, HTN, HLD, chronic systolic CHF EF 0000000, COPD oxygen dependent (2L), MI, and tobacco abuse presented with complaint of gradually worsening shortness of breath. On arrival, patient was hypoxic, febrile, tachycardic, tachypneic, hypotensive, and had a WBC of 23,000. While in the ED, patient was given IV fluids and antibiotics as well as Solu-Medrol. CXR was remarkable for pneumonia. Patient was also put on BiPAP. Patient was admitted for further treatment of sepsis and respiratory failure.   Assessment & Plan:   Principal Problem:   Sepsis ([place]) Active Problems:   Essential hypertension   CAD (coronary artery disease)   Anxiety   COPD with acute exacerbation (HCC)   Chronic pain syndrome   Acute on chronic respiratory failure with hypoxia (HCC)   Chronic systolic CHF (congestive heart failure) (HCC)   Hypotension   CAP (community acquired pneumonia)   Anemia   Thrombocytopenia (HCC)   Fever   Elevated troponin   Acute encephalopathy  1. Sepsis secondary to community acquired pneumonia. Upon arrival patient was noted to be hypoxic, tachypneic, hypotensive, and tachycardic. Chest x-ray was notable for pneumonia. Blood cultures and sputum cultures have been ordered. Influenza panel was negative. He was started on ceftriaxone, azithromycin and vancomycin. MRSA PCR is negative, so vancomycin discontinued. Lactic acid normal range. He was bolused IV fluids per sepsis protocol. Will hold off on further fluids, so as not to precipitate CHF exacerbation.   2. Acute on chronic respiratory failure. Related to pneumonia. Patient is chronically on 2L. He was placed on bipap on admission due to increased work of breathing and respiratory acidosis. PCO2 has improved since admission. When taken off bipap, he  desaturates into the 80s and has to go back on bipap. Pulmonology following. Will try and wean off bipap as tolerated. 3. Acute encephalopathy. Patient became agitated and was having hallucinations. Review of medications show that he was taking clonazepam at home which was not continued on admission due to lethargy. He may have been having some element of benzo withdrawal. Started on ativan with some improvement. Will repeat ABG. No fever or signs of worsening infection. 4. Elevated Troponin. Patient presented with an elevated troponin of 0.07. Likely demand ischemia. No complaints of chest pain. Cardiology following.  5. CAD. No complaints of chest pain. Continue Lipitor, nitroglycerin, and metoprolol.  6. COPD exacerbation. He was noted to be short of breath and wheezing on admission. Continue on bronchodilators, abx and steroids.  7. Chronic systolic congestive heart failure, EF 20-25% in 04/2015. Compensated at this time. He is on low dose beta blockers. Diuretics on hold due to low blood pressures.  8. HTN. Blood pressures have been stable. Continue to hold diuertices for now.  9. HLD. Continue statin.  10. Anxiety and Depression. Stable.   11. Tobacco abuse. Counseled on cessation.   12. Chronic pain syndrome. Continue home pain regimen 13. Thrombocytopenia. Somewhat chronic, but likely lower due to acute infection. Continue to follow. No signs of bleeding at this time. Overall improving.  DVT prophylaxis: Lovenox Code Status: Full code, long term prognosis appears poor. Will consult palliative care for goals of care Family Communication: no family at bedside Disposition Plan: Discharge home once improved   Consultants:   Cardiology  pulmonology  Procedures:   BiPAP  Antimicrobials:   IV Zithromax 11/30 >>  12/7  IV Rocephin 11/30 >> 12/7  Vancomycin 11/29 >> 11/30   Subjective: Wakes up to voice, but then falls back asleep  Objective: Vitals:   04/09/16 0600 04/09/16  0711 04/09/16 0732 04/09/16 1003  BP: 140/81   127/86  Pulse: 98 95  98  Resp: (!) 8     Temp:  97.9 F (36.6 C)    TempSrc:  Axillary    SpO2: 99% 96% 97%   Weight:      Height:        Intake/Output Summary (Last 24 hours) at 04/09/16 1010 Last data filed at 04/09/16 0500  Gross per 24 hour  Intake              240 ml  Output              325 ml  Net              -85 ml   Filed Weights   04/07/16 0400 04/08/16 0439 04/09/16 0500  Weight: 63.7 kg (140 lb 6.9 oz) 65.5 kg (144 lb 6.4 oz) 65.3 kg (143 lb 15.4 oz)    Examination:  General exam: calm, somnolent Respiratory system: clear bilaterally. Respiratory effort normal. Cardiovascular system: S1 & S2 heard, RRR. No JVD, murmurs, rubs, gallops or clicks. No pedal edema. Gastrointestinal system: Abdomen is nondistended, soft and nontender. No organomegaly or masses felt. Normal bowel sounds heard. Central nervous system:  No focal neurological deficits. Extremities: Symmetric 5 x 5 power. Skin: No rashes, lesions or ulcers Psychiatry: calm, somnolent    Data Reviewed: I have personally reviewed following labs and imaging studies  CBC:  Recent Labs Lab 04/04/16 0822 04/05/16 0403 04/06/16 0359 04/07/16 0436 04/08/16 0428 04/09/16 0403  WBC 23.1* 19.3* 16.3* 12.7* 9.3 7.9  NEUTROABS 22.2*  --   --   --   --   --   HGB 14.9 11.7* 11.2* 11.5* 11.9* 12.6*  HCT 47.8 38.4* 35.9* 37.5* 39.1 41.0  MCV 101.1* 102.1* 100.8* 100.0 101.3* 100.0  PLT 142* 101* 114* 120* 119* [id number]*   Basic Metabolic Panel:  Recent Labs Lab 04/05/16 0403 04/06/16 0359 04/07/16 0436 04/08/16 0428 04/09/16 0403  NA 141 139 143 141 141  K 3.9 4.1 4.3 4.8 4.5  CL 105 101 104 100* 97*  CO2 32 34* 37* 38* 39*  GLUCOSE 119* 163* 157* 127* 169*  BUN 17 16 15 17 17   CREATININE 0.45* 0.51* 0.41* 0.36* 0.37*  CALCIUM 8.6* 9.2 9.8 9.9 9.7  MG 1.7 2.2  --   --   --    GFR: Estimated Creatinine Clearance: 72.6 mL/min (by C-G formula based  on SCr of 0.37 mg/dL (L)). Liver Function Tests: No results for input(s): AST, ALT, ALKPHOS, BILITOT, PROT, ALBUMIN in the last 168 hours. No results for input(s): LIPASE, AMYLASE in the last 168 hours. No results for input(s): AMMONIA in the last 168 hours. Coagulation Profile: No results for input(s): INR, PROTIME in the last 168 hours. Cardiac Enzymes:  Recent Labs Lab 04/04/16 0822  TROPONINI 0.07*   BNP (last 3 results) No results for input(s): PROBNP in the last 8760 hours. HbA1C: No results for input(s): HGBA1C in the last 72 hours. CBG: No results for input(s): GLUCAP in the last 168 hours. Lipid Profile: No results for input(s): CHOL, HDL, LDLCALC, TRIG, CHOLHDL, LDLDIRECT in the last 72 hours. Thyroid Function Tests: No results for input(s): TSH, T4TOTAL, FREET4, T3FREE, THYROIDAB in the last 72 hours.  Anemia Panel: No results for input(s): VITAMINB12, FOLATE, FERRITIN, TIBC, IRON, RETICCTPCT in the last 72 hours. Sepsis Labs:  Recent Labs Lab 04/04/16 [id number] 04/04/16 1220 04/04/16 1555 04/04/16 1602 04/04/16 1943  PROCALCITON  --   --  15.58  --   --   LATICACIDVEN 1.6 1.2  --  1.0 1.1    Recent Results (from the past 240 hour(s))  Blood culture (routine x 2)     Status: None (Preliminary result)   Collection Time: 04/04/16  8:32 AM  Result Value Ref Range Status   Specimen Description BLOOD LEFT HAND  Final   Special Requests   Final    BOTTLES DRAWN AEROBIC AND ANAEROBIC AEB=10CC ANA=8CC   Culture NO GROWTH 2 DAYS  Final   Report Status PENDING  Incomplete  Blood culture (routine x 2)     Status: None (Preliminary result)   Collection Time: 04/04/16  8:37 AM  Result Value Ref Range Status   Specimen Description BLOOD RIGHT HAND  Final   Special Requests BOTTLES DRAWN AEROBIC AND ANAEROBIC 10CC ANA=8CC  Final   Culture NO GROWTH 2 DAYS  Final   Report Status PENDING  Incomplete  Urine culture     Status: Abnormal   Collection Time: 04/04/16 11:08 AM    Result Value Ref Range Status   Specimen Description URINE, CLEAN CATCH  Final   Special Requests NONE  Final   Culture MULTIPLE SPECIES PRESENT, SUGGEST RECOLLECTION (A)  Final   Report Status 04/06/2016 FINAL  Final  MRSA PCR Screening     Status: None   Collection Time: 04/04/16 12:15 PM  Result Value Ref Range Status   MRSA by PCR NEGATIVE NEGATIVE Final    Comment:        The GeneXpert MRSA Assay (FDA approved for NASAL specimens only), is one component of a comprehensive MRSA colonization surveillance program. It is not intended to diagnose MRSA infection nor to guide or monitor treatment for MRSA infections.   Culture, sputum-assessment     Status: None   Collection Time: 04/05/16 11:50 PM  Result Value Ref Range Status   Specimen Description SPUTUM  Final   Special Requests NONE  Final   Sputum evaluation   Final    THIS SPECIMEN IS ACCEPTABLE. RESPIRATORY CULTURE REPORT TO FOLLOW.   Report Status 04/06/2016 FINAL  Final  Culture, respiratory (NON-Expectorated)     Status: None   Collection Time: 04/05/16 11:50 PM  Result Value Ref Range Status   Specimen Description SPUTUM  Final   Special Requests NONE  Final   Gram Stain   Final    FEW WBC PRESENT, PREDOMINANTLY PMN RARE GRAM POSITIVE COCCI IN PAIRS AND CHAINS RARE GRAM NEGATIVE COCCOBACILLI RARE GRAM POSITIVE RODS    Culture   Final    Consistent with normal respiratory flora. Performed at [facility]    Report Status 04/08/2016 FINAL  Final         Radiology Studies: Dg Chest Port 1 View  Result Date: 04/08/2016 CLINICAL DATA:  Shortness of breath. EXAM: PORTABLE CHEST 1 VIEW COMPARISON:  April 04, 2016 FINDINGS: Persistent infiltrate in the left lung, mildly improved in the interval. Stable cardiomegaly. Skin fold over the lateral right chest. No other changes. IMPRESSION: Mildly improved left-sided infiltrate. Recommend follow-up to resolution. Electronically Signed   By: Dorise Bullion III M.D   On: 04/08/2016 09:12        Scheduled Meds: . aspirin EC  81  mg Oral Daily  . atorvastatin  80 mg Oral Daily  . azithromycin  500 mg Intravenous Q24H  . cefTRIAXone (ROCEPHIN)  IV  1 g Intravenous Q24H  . chlorhexidine  15 mL Mouth Rinse BID  . enoxaparin (LOVENOX) injection  40 mg Subcutaneous Q24H  . feeding supplement (ENSURE ENLIVE)  237 mL Oral TID BM  . finasteride  5 mg Oral Daily  . guaiFENesin  1,200 mg Oral BID  . ipratropium-albuterol  3 mL Nebulization Q6H  . LORazepam  0.5 mg Intravenous Q6H  . mouth rinse  15 mL Mouth Rinse q12n4p  . methylPREDNISolone (SOLU-MEDROL) injection  60 mg Intravenous Q6H  . metoprolol succinate  12.5 mg Oral BID  . mometasone-formoterol  2 puff Inhalation BID  . nicotine  21 mg Transdermal Daily   Continuous Infusions:    LOS: 5 days    Time spent: 25 minutes    Kathie Dike, MD [facility]   If 7PM-7AM, please contact night-coverage [email or web address] Password [email or web address] 04/09/2016, 10:10 AM

## 2016-04-10 DIAGNOSIS — Z7189 Other specified counseling: Secondary | ICD-10-CM

## 2016-04-10 DIAGNOSIS — Z515 Encounter for palliative care: Secondary | ICD-10-CM

## 2016-04-10 LAB — BASIC METABOLIC PANEL
ANION GAP: 4 — AB (ref 5–15)
BUN: 16 mg/dL (ref 6–20)
CHLORIDE: 96 mmol/L — AB (ref 101–111)
CO2: 40 mmol/L — AB (ref 22–32)
CREATININE: 0.3 mg/dL — AB (ref 0.61–1.24)
Calcium: 9.5 mg/dL (ref 8.9–10.3)
GFR calc non Af Amer: >60 mL/min (ref >60)
Glucose, Bld: 147 mg/dL — ABNORMAL HIGH (ref 65–99)
POTASSIUM: 4.4 mmol/L (ref 3.5–5.1)
SODIUM: 140 mmol/L (ref 135–145)

## 2016-04-10 LAB — URINALYSIS, ROUTINE W REFLEX MICROSCOPIC
Bilirubin Urine: NEGATIVE
GLUCOSE, UA: NEGATIVE mg/dL
Hgb urine dipstick: NEGATIVE
KETONES UR: NEGATIVE mg/dL
LEUKOCYTES UA: NEGATIVE
NITRITE: NEGATIVE
PH: 7 (ref 5.0–8.0)
Protein, ur: NEGATIVE mg/dL
SPECIFIC GRAVITY, URINE: 1.016 (ref 1.005–1.030)

## 2016-04-10 LAB — CBC
HEMATOCRIT: 42.6 % (ref 39.0–52.0)
HEMOGLOBIN: 13.4 g/dL (ref 13.0–17.0)
MCH: 30.9 pg (ref 26.0–34.0)
MCHC: 31.5 g/dL (ref 30.0–36.0)
MCV: 98.2 fL (ref 78.0–100.0)
PLATELETS: 139 10*3/uL — AB (ref 150–400)
RBC: 4.34 MIL/uL (ref 4.22–5.81)
RDW: 12.6 % (ref 11.5–15.5)
WBC: 11.2 10*3/uL — AB (ref 4.0–10.5)

## 2016-04-10 LAB — AMMONIA: Ammonia: 26 umol/L (ref 9–35)

## 2016-04-10 MED ORDER — FLEET ENEMA 7-19 GM/118ML RE ENEM
1.0000 | ENEMA | Freq: Once | RECTAL | Status: AC
Start: 2016-04-10 — End: 2016-04-10
  Administered 2016-04-10: 1 via RECTAL

## 2016-04-10 MED ORDER — LORAZEPAM 2 MG/ML IJ SOLN
1.0000 mg | Freq: Once | INTRAMUSCULAR | Status: AC
  Administered 2016-04-10: 1 mg via INTRAVENOUS
  Filled 2016-04-10: qty 1

## 2016-04-10 NOTE — Progress Notes (Signed)
Pt had episode of confusion around 0010, gave ativan .5 that was scheduled PRN Q 6hrs, confusion remained and unchanged. Pt still trying to get out of bed and has increased confusion. Notified Md. Hanley Seamen ordered 1 time dose of ativan around 0215. Pt is now asleep. Will continue to monitor closely.  Ericka Pontiff, RN 2:38 AM 04/10/16

## 2016-04-10 NOTE — Progress Notes (Signed)
Patient Name: Tommy Turner Date of Encounter: 04/10/2016  Primary Cardiologist: Dr. Raeford Razor Problem List     Principal Problem:   Sepsis [facility]) Active Problems:   Essential hypertension   CAD (coronary artery disease)   Anxiety   COPD with acute exacerbation (HCC)   Chronic pain syndrome   Acute on chronic respiratory failure with hypoxia (HCC)   Chronic systolic CHF (congestive heart failure) ([place])   Hypotension   CAP (community acquired pneumonia)   Anemia   Thrombocytopenia (HCC)   Fever   Elevated troponin   Acute encephalopathy    Subjective   Remains confused. No obvious reported chest pain or palpitations.  Inpatient Medications    Scheduled Meds: . aspirin EC  81 mg Oral Daily  . atorvastatin  80 mg Oral Daily  . azithromycin  500 mg Intravenous Q24H  . cefTRIAXone (ROCEPHIN)  IV  1 g Intravenous Q24H  . chlorhexidine  15 mL Mouth Rinse BID  . enoxaparin (LOVENOX) injection  40 mg Subcutaneous Q24H  . feeding supplement (ENSURE ENLIVE)  237 mL Oral TID BM  . finasteride  5 mg Oral Daily  . guaiFENesin  1,200 mg Oral BID  . ipratropium-albuterol  3 mL Nebulization Q6H  . losartan  12.5 mg Oral Daily  . mouth rinse  15 mL Mouth Rinse q12n4p  . methylPREDNISolone (SOLU-MEDROL) injection  60 mg Intravenous Q6H  . metoprolol succinate  12.5 mg Oral BID  . mometasone-formoterol  2 puff Inhalation BID  . nicotine  21 mg Transdermal Daily  . sodium phosphate  1 enema Rectal Once    PRN Meds: acetaminophen **OR** acetaminophen, albuterol, HYDROcodone-acetaminophen, LORazepam, ondansetron **OR** ondansetron (ZOFRAN) IV   Vital Signs    Vitals:   04/10/16 0800 04/10/16 0808 04/10/16 0821 04/10/16 0900  BP: 93/76   128/76  Pulse: 89   85  Resp: 16   18  Temp:      TempSrc:      SpO2: 97% 95% 97% 95%  Weight:      Height:        Intake/Output Summary (Last 24 hours) at 04/10/16 0920 Last data filed at 04/10/16 0831  Gross per 24  hour  Intake              680 ml  Output             1900 ml  Net            -1220 ml   Filed Weights   04/08/16 0439 04/09/16 0500 04/10/16 0500  Weight: 144 lb 6.4 oz (65.5 kg) 143 lb 15.4 oz (65.3 kg) 139 lb 8.8 oz (63.3 kg)    Physical Exam   Gen: Chronically ill-appearing male, no distress. On nasal cannula. Neck: Supple, no elevated JVP or carotid bruits, no thyromegaly. Lungs: Diminished breath sounds with prolonged expiratory phase and mildly labored breathing.. Cardiac: Regular rate and rhythm, no S3, soft systolic murmur. Abdomen: Soft, nontender, no hepatomegaly, bowel sounds present, no guarding or rebound. Extremities: No pitting edema, distal pulses 2+.  Labs    CBC  Recent Labs  04/09/16 0403 04/10/16 0819  WBC 7.9 11.2*  HGB 12.6* 13.4  HCT 41.0 42.6  MCV 100.0 98.2  PLT 134* [id number]*   Basic Metabolic Panel  Recent Labs  04/09/16 0403 04/10/16 0819  NA 141 140  K 4.5 4.4  CL 97* 96*  CO2 39* 40*  GLUCOSE 169* 147*  BUN 17  16  CREATININE 0.37* 0.30*  CALCIUM 9.7 9.5    Telemetry    I personally reviewed telemetry monitoring which shows sinus rhythm.  Radiology    Chest x-ray 04/08/2016: FINDINGS: Persistent infiltrate in the left lung, mildly improved in the interval. Stable cardiomegaly. Skin fold over the lateral right chest. No other changes.  IMPRESSION: Mildly improved left-sided infiltrate. Recommend follow-up to resolution.  Cardiac Studies   Echocardiogram 04/09/2016: Study Conclusions  - Left ventricle: The cavity size was mildly dilated. Wall   thickness was increased in a pattern of mild LVH. Systolic   function was severely reduced. The estimated ejection fraction   was 20%. There is akinesis of the inferior and inferoseptal   myocardium. There is dyskinesis of the mid-apicalanteroseptal and   apical myocardium. The study is not technically sufficient to   allow evaluation of LV diastolic function. - Aortic valve:  Mildly calcified annulus. Trileaflet; mildly   calcified leaflets. - Aortic root: The aortic root was mildly dilated. - Mitral valve: Calcified annulus. Mildly thickened leaflets .   There was mild regurgitation. - Left atrium: The atrium was severely dilated. - Right ventricle: Systolic function was moderately reduced. - Right atrium: The atrium was mildly dilated. Central venous   pressure (est): 3 mm Hg. - Atrial septum: No defect or patent foramen ovale was identified. - Tricuspid valve: There was trivial regurgitation. - Pulmonary arteries: Systolic pressure could not be accurately   estimated. - Pericardium, extracardiac: There was no pericardial effusion.  Impressions:  - Mild LVH with mild chamber dilatation and LVEF approximately 20%.   Wall motion abnormalities consistent with ischemic   cardiomyopathy. Indeterminate diastolic function. Severe left   atrial enlargement. Mitral annular calcification with mild mitral   regurgitation. Mildly dilated aortic root. Moderately reduced   right ventricular contraction. Trivial tricuspid regurgitation.   Mild right atrial enlargement.  Patient Profile     75 year old male with multivessel CAD status post previous CABG, chronic systolic heart failure with ischemic cardiomyopathy (declined ICD), RV dysfunction, and severe COPD with acute on chronic combined respiratory failure and sepsis in the setting of community-acquired pneumonia. He had a mild troponin I increase most consistent with demand ischemia. Follow-up echocardiogram shows stable LVEF approximately 20%. He is being managed medically.  Assessment & Plan    1. Sepsis felt due to CAP +/- AECOPD - per primary team. Continues on antibiotics, steroids, nebs.  2. Hypotension - Improved. Blood pressure has allowed titration of cardiac medications.  3. History of ischemic cardiomyopathy with chronic systolic heart failure. LVEF 20% by follow-up echocardiogram.  4.  Minimally elevated troponin (with history of known CAD and nonischemic nuc 2015) - per notes, felt c/w demand ischemia due to sepsis. Continue ASA and statin.  5. PVCs/brief NSVT.  No major change in clinical status, still requiring support and input from Pulmonary. Cardiac regimen now includes aspirin, Lipitor, Cozaar, and Toprol-XL. Follow-up chest x-ray pending, does not look to be fluid overloaded. Echocardiogram from yesterday showed stable LVEF.  Signed, Satira Sark, M.D., F.A.C.C.  04/10/2016, 9:20 AM

## 2016-04-10 NOTE — Progress Notes (Signed)
Subjective: He's had more confusion overnight. He is sleepy now but arousable. Clearly confused. No other new problems noted overnight. He is on nasal cannula with moderate high flow. Oxygen saturations in the 90s on this  Objective: Vital signs in last 24 hours: Temp:  [97.7 F (36.5 C)-98.9 F (37.2 C)] 97.9 F (36.6 C) (12/05 0712) Pulse Rate:  [69-98] 91 (12/05 0712) Resp:  [0-28] 22 (12/05 0712) BP: (127-138)/(75-98) 131/75 (12/05 0400) SpO2:  [82 %-100 %] 97 % (12/05 0712) FiO2 (%):  [40 %-100 %] 80 % (12/05 0429) Weight:  [63.3 kg (139 lb 8.8 oz)] 63.3 kg (139 lb 8.8 oz) (12/05 0500) Weight change: -2 kg (-4 lb 6.6 oz) Last BM Date: 04/03/16 (last documented BM >3 days)  Intake/Output from previous day: 12/04 0701 - 12/05 0700 In: 180 [P.O.:180] Out: 1900 [Urine:1900]  PHYSICAL EXAM General appearance: He looks chronically sick. He is on nasal cannula. He is confused. Sleepy but arousable after Ativan Resp: He still has increased respiratory effort. Respiratory rate around 20. His lungs are clear with prolonged expiratory phase Cardio: regular rate and rhythm, S1, S2 normal, no murmur, click, rub or gallop GI: soft, non-tender; bowel sounds normal; no masses,  no organomegaly Extremities: extremities normal, atraumatic, no cyanosis or edema Pupils react. EOMI. Mucous membranes slightly dry. Skin warm and dry.  Lab Results:  Results for orders placed or performed during the hospital encounter of 04/04/16 (from the past 48 hour(s))  CBC     Status: Abnormal   Collection Time: 04/09/16  4:03 AM  Result Value Ref Range   WBC 7.9 4.0 - 10.5 K/uL   RBC 4.10 (L) 4.22 - 5.81 MIL/uL   Hemoglobin 12.6 (L) 13.0 - 17.0 g/dL   HCT 41.0 39.0 - 52.0 %   MCV 100.0 78.0 - 100.0 fL   MCH 30.7 26.0 - 34.0 pg   MCHC 30.7 30.0 - 36.0 g/dL   RDW 12.9 11.5 - 15.5 %   Platelets 134 (L) 150 - 400 K/uL  Basic metabolic panel     Status: Abnormal   Collection Time: 04/09/16  4:03 AM   Result Value Ref Range   Sodium 141 135 - 145 mmol/L   Potassium 4.5 3.5 - 5.1 mmol/L   Chloride 97 (L) 101 - 111 mmol/L   CO2 39 (H) 22 - 32 mmol/L   Glucose, Bld 169 (H) 65 - 99 mg/dL   BUN 17 6 - 20 mg/dL   Creatinine, Ser 0.37 (L) 0.61 - 1.24 mg/dL   Calcium 9.7 8.9 - 10.3 mg/dL   GFR calc non Af Amer >60 >60 mL/min   GFR calc Af Amer >60 >60 mL/min    Comment: (NOTE) The eGFR has been calculated using the CKD EPI equation. This calculation has not been validated in all clinical situations. eGFR's persistently <60 mL/min signify possible Chronic Kidney Disease.    Anion gap 5 5 - 15  Blood gas, arterial     Status: Abnormal   Collection Time: 04/09/16 10:30 AM  Result Value Ref Range   O2 Content 9.0 L/min   Delivery systems NASAL CANNULA    pH, Arterial 7.376 7.350 - 7.450   pCO2 arterial 66.7 (HH) 32.0 - 48.0 mmHg    Comment: CRITICAL RESULT CALLED TO, READ BACK BY AND VERIFIED WITH: BOB LEE RN,[id number] BY WENDY VIA,RRT,RCP ON 04/09/2016    pO2, Arterial 125 (H) 83.0 - 108.0 mmHg   Bicarbonate 34.4 (H) 20.0 - 28.0 mmol/L  Acid-Base Excess 12.6 (H) 0.0 - 2.0 mmol/L   O2 Saturation 97.1 %   Patient temperature 37.0    Collection site BRACHIAL ARTERY    Drawn by [id number]    Sample type ARTERIAL DRAW    Allens test (pass/fail) PASS PASS    ABGS  Recent Labs  04/09/16 1030  PHART 7.376  PO2ART 125*  HCO3 34.4*   CULTURES Recent Results (from the past 240 hour(s))  Blood culture (routine x 2)     Status: None (Preliminary result)   Collection Time: 04/04/16  8:32 AM  Result Value Ref Range Status   Specimen Description BLOOD LEFT HAND  Final   Special Requests   Final    BOTTLES DRAWN AEROBIC AND ANAEROBIC AEB=10CC ANA=8CC   Culture NO GROWTH 2 DAYS  Final   Report Status PENDING  Incomplete  Blood culture (routine x 2)     Status: None (Preliminary result)   Collection Time: 04/04/16  8:37 AM  Result Value Ref Range Status   Specimen Description BLOOD  RIGHT HAND  Final   Special Requests BOTTLES DRAWN AEROBIC AND ANAEROBIC 10CC ANA=8CC  Final   Culture NO GROWTH 2 DAYS  Final   Report Status PENDING  Incomplete  Urine culture     Status: Abnormal   Collection Time: 04/04/16 11:08 AM  Result Value Ref Range Status   Specimen Description URINE, CLEAN CATCH  Final   Special Requests NONE  Final   Culture MULTIPLE SPECIES PRESENT, SUGGEST RECOLLECTION (A)  Final   Report Status 04/06/2016 FINAL  Final  MRSA PCR Screening     Status: None   Collection Time: 04/04/16 12:15 PM  Result Value Ref Range Status   MRSA by PCR NEGATIVE NEGATIVE Final    Comment:        The GeneXpert MRSA Assay (FDA approved for NASAL specimens only), is one component of a comprehensive MRSA colonization surveillance program. It is not intended to diagnose MRSA infection nor to guide or monitor treatment for MRSA infections.   Culture, sputum-assessment     Status: None   Collection Time: 04/05/16 11:50 PM  Result Value Ref Range Status   Specimen Description SPUTUM  Final   Special Requests NONE  Final   Sputum evaluation   Final    THIS SPECIMEN IS ACCEPTABLE. RESPIRATORY CULTURE REPORT TO FOLLOW.   Report Status 04/06/2016 FINAL  Final  Culture, respiratory (NON-Expectorated)     Status: None   Collection Time: 04/05/16 11:50 PM  Result Value Ref Range Status   Specimen Description SPUTUM  Final   Special Requests NONE  Final   Gram Stain   Final    FEW WBC PRESENT, PREDOMINANTLY PMN RARE GRAM POSITIVE COCCI IN PAIRS AND CHAINS RARE GRAM NEGATIVE COCCOBACILLI RARE GRAM POSITIVE RODS    Culture   Final    Consistent with normal respiratory flora. Performed at [facility]    Report Status 04/08/2016 FINAL  Final   Studies/Results: No results found.  Medications:  Prior to Admission:  Prescriptions Prior to Admission  Medication Sig Dispense Refill Last Dose  . albuterol (PROVENTIL HFA;VENTOLIN HFA) 108 (90 Base) MCG/ACT inhaler  Inhale 2 puffs into the lungs every 4 (four) hours as needed for wheezing or shortness of breath.   04/04/2016 at Unknown time  . albuterol (PROVENTIL) (2.5 MG/3ML) 0.083% nebulizer solution Take 3 mLs (2.5 mg total) by nebulization every 4 (four) hours as needed for wheezing or shortness of breath. 75  mL 12 04/04/2016 at Unknown time  . aspirin EC 81 MG tablet Take 81 mg by mouth daily.    04/03/2016 at Unknown time  . atorvastatin (LIPITOR) 80 MG tablet Take 1 tablet (80 mg total) by mouth daily. 90 tablet 3 04/03/2016 at Unknown time  . benazepril (LOTENSIN) 5 MG tablet Take 1 tablet (5 mg total) by mouth daily. 90 tablet 0 04/03/2016 at Unknown time  . budesonide-formoterol (SYMBICORT) 160-4.5 MCG/ACT inhaler Inhale 2 puffs into the lungs 2 (two) times daily. 3 Inhaler 3 04/03/2016 at Unknown time  . clonazePAM (KLONOPIN) 0.5 MG tablet TAKE 1 TABLET BY MOUTH TWICE DAILY AS NEEDED FOR ANXIETY 60 tablet 5 04/03/2016 at Unknown time  . finasteride (PROSCAR) 5 MG tablet Take 5 mg by mouth daily.  11 04/03/2016 at Unknown time  . metoprolol succinate (TOPROL-XL) 25 MG 24 hr tablet TAKE 1/2 TABLET BY MOUTH 2 TIMES DAILY. 90 tablet 3 04/03/2016 at 2000  . potassium chloride SA (K-DUR,KLOR-CON) 20 MEQ tablet Take 1 tablet (20 mEq total) by mouth daily. 90 tablet 3 04/03/2016 at Unknown time  . tiotropium (SPIRIVA) 18 MCG inhalation capsule Place 18 mcg into inhaler and inhale daily.   04/03/2016 at Unknown time  . torsemide (DEMADEX) 20 MG tablet Take 2 tablets (40 mg total) by mouth daily. 180 tablet 1 04/03/2016 at Unknown time  . HYDROcodone-acetaminophen (NORCO) 10-325 MG tablet Take 0.5 tablets by mouth every 6 (six) hours as needed for severe pain. 60 tablet 0 unknown  . nitroGLYCERIN (NITROSTAT) 0.4 MG SL tablet Place 1 tablet (0.4 mg total) under the tongue every 5 (five) minutes as needed for chest pain. 100 tablet 0 unknown   Scheduled: . aspirin EC  81 mg Oral Daily  . atorvastatin  80 mg  Oral Daily  . azithromycin  500 mg Intravenous Q24H  . cefTRIAXone (ROCEPHIN)  IV  1 g Intravenous Q24H  . chlorhexidine  15 mL Mouth Rinse BID  . enoxaparin (LOVENOX) injection  40 mg Subcutaneous Q24H  . feeding supplement (ENSURE ENLIVE)  237 mL Oral TID BM  . finasteride  5 mg Oral Daily  . guaiFENesin  1,200 mg Oral BID  . ipratropium-albuterol  3 mL Nebulization Q6H  . losartan  12.5 mg Oral Daily  . mouth rinse  15 mL Mouth Rinse q12n4p  . methylPREDNISolone (SOLU-MEDROL) injection  60 mg Intravenous Q6H  . metoprolol succinate  12.5 mg Oral BID  . mometasone-formoterol  2 puff Inhalation BID  . nicotine  21 mg Transdermal Daily   Continuous:  OYD:XAJOINOMVEHMC **OR** acetaminophen, albuterol, HYDROcodone-acetaminophen, LORazepam, ondansetron **OR** ondansetron (ZOFRAN) IV  Assesment: He was admitted with sepsis with community-acquired pneumonia. He has developed acute on chronic hypoxic and hypercapnic respiratory failure. His sepsis has resolved. His blood pressure is better. He has COPD at baseline and seems to be having an acute exacerbation precipitated by his pneumonia. He has been off and on BiPAP but has done pretty well the last 48 hours. He is now on high flow nasal oxygen with good oxygenation.  He had acute encephalopathy thought to be metabolic related to his acute illness and possibly withdrawal from benzodiazepines and nicotine. He is now back on enteral diazepam to nicotine and had improved but had another episode of confusion during the night last night. He received Ativan and he is sleepy now.  He has chronic systolic heart failure but does not seem to be having an acute exacerbation.   Principal Problem:  Sepsis ([place]) Active Problems:   Essential hypertension   CAD (coronary artery disease)   Anxiety   COPD with acute exacerbation (HCC)   Chronic pain syndrome   Acute on chronic respiratory failure with hypoxia (HCC)   Chronic systolic CHF (congestive  heart failure) (HCC)   Hypotension   CAP (community acquired pneumonia)   Anemia   Thrombocytopenia (HCC)   Fever   Elevated troponin   Acute encephalopathy    Plan: Continue treatments. Monitor clinically for need for BiPAP.    LOS: 6 days   Kartel Wolbert L 04/10/2016, 7:34 AM

## 2016-04-10 NOTE — Progress Notes (Signed)
PROGRESS NOTE    Tommy Turner  [id number] DOB: May 28, 1940 DOA: 04/04/2016 PCP: Sallee Lange, MD    Brief Narrative:  75 y.o. male with medical history significant of AAA, anxiety, CAD, HTN, HLD, chronic systolic CHF EF 0000000, COPD oxygen dependent (2L), MI, and tobacco abuse presented with complaint of gradually worsening shortness of breath. He was admitted to the hospital for sepsis related to pneumonia as well as COPD exacerbation. Initially he required bipap, but has since been weaned off. Hospital course has been complicated by development of confusion and agitation. He is receiving intermittent ativan. Pulmonology and cardiology following. Palliative care also consulted to discuss goals of care.   Assessment & Plan:   Principal Problem:   Sepsis ([place]) Active Problems:   Essential hypertension   CAD (coronary artery disease)   Anxiety   COPD with acute exacerbation (HCC)   Chronic pain syndrome   Acute on chronic respiratory failure with hypoxia (HCC)   Chronic systolic CHF (congestive heart failure) (HCC)   Hypotension   CAP (community acquired pneumonia)   Anemia   Thrombocytopenia (HCC)   Fever   Elevated troponin   Acute encephalopathy  1. Sepsis secondary to community acquired pneumonia. Upon arrival patient was noted to be hypoxic, tachypneic, hypotensive, and tachycardic. Chest x-ray was notable for pneumonia. Blood cultures and sputum cultures have been ordered. Influenza panel was negative. He was started on ceftriaxone, azithromycin and vancomycin. MRSA PCR is negative, so vancomycin discontinued. Lactic acid normal range. He was bolused IV fluids per sepsis protocol. Will hold off on further fluids, so as not to precipitate CHF exacerbation.   2. Acute on chronic respiratory failure. Related to pneumonia. Patient is chronically on 2L. He was placed on bipap on admission due to increased work of breathing and respiratory acidosis. PCO2 has improved since  admission. He has currently been weaned off bipap. Pulmonology following. He is still on high flow nasal cannula 3. Acute encephalopathy. Patient became agitated and was having hallucinations. Review of medications show that he was taking clonazepam at home which was not continued on admission due to lethargy. He may have been having some element of benzo withdrawal. Started on ativan. ABG shows improving PCO2. No fever or signs of worsening infection. Check ammonia and urinalysis. 4. Elevated Troponin. Patient presented with an elevated troponin of 0.07. Likely demand ischemia. No complaints of chest pain. Cardiology following.  5. CAD. No complaints of chest pain. Continue Lipitor, nitroglycerin, and metoprolol.  6. COPD exacerbation. He was noted to be short of breath and wheezing on admission. Continue on bronchodilators, abx and steroids.  7. Chronic systolic congestive heart failure, EF 20-25% in 04/2015. Compensated at this time. He is on low dose beta blockers. Diuretics on hold due to low blood pressures.  8. HTN. Blood pressures have been stable. Continue to hold diuertices for now.  9. HLD. Continue statin.  10. Anxiety and Depression. Stable.   11. Tobacco abuse. Counseled on cessation.   12. Chronic pain syndrome. Continue home pain regimen 13. Thrombocytopenia. Somewhat chronic, but likely lower due to acute infection. Continue to follow. No signs of bleeding at this time. Overall improving.  DVT prophylaxis: Lovenox Code Status: Full code, long term prognosis appears poor. Palliative care meeting today for goals of care Family Communication: discussed with wife at bedside Disposition Plan: Discharge home once improved   Consultants:   Cardiology  pulmonology  Procedures:   BiPAP  Antimicrobials:   IV Zithromax 11/30 >> 12/7  IV Rocephin 11/30 >> 12/7  Vancomycin 11/29 >> 11/30   Subjective: Opens eyes to voice and says yes and no, but quickly falls back  asleep  Objective: Vitals:   04/10/16 0712 04/10/16 0800 04/10/16 0808 04/10/16 0821  BP:  93/76    Pulse: 91 89    Resp: (!) 22 16    Temp: 97.9 F (36.6 C)     TempSrc: Axillary     SpO2: 97% 97% 95% 97%  Weight:      Height:        Intake/Output Summary (Last 24 hours) at 04/10/16 0838 Last data filed at 04/10/16 0500  Gross per 24 hour  Intake              180 ml  Output             1900 ml  Net            -1720 ml   Filed Weights   04/08/16 0439 04/09/16 0500 04/10/16 0500  Weight: 65.5 kg (144 lb 6.4 oz) 65.3 kg (143 lb 15.4 oz) 63.3 kg (139 lb 8.8 oz)    Examination:  General exam: calm, somnolent, cachetic Respiratory system: CTA B. Respiratory effort normal. Cardiovascular system: S1 & S2 heard, RRR. No JVD, murmurs, rubs, gallops or clicks. No pedal edema. Gastrointestinal system: Abdomen is nondistended, soft and nontender. No organomegaly or masses felt. Normal bowel sounds heard. Central nervous system:  No focal neurological deficits. Extremities: Symmetric 5 x 5 power. Skin: No rashes, lesions or ulcers Psychiatry: calm, somnolent    Data Reviewed: I have personally reviewed following labs and imaging studies  CBC:  Recent Labs Lab 04/04/16 0822 04/05/16 0403 04/06/16 0359 04/07/16 0436 04/08/16 0428 04/09/16 0403  WBC 23.1* 19.3* 16.3* 12.7* 9.3 7.9  NEUTROABS 22.2*  --   --   --   --   --   HGB 14.9 11.7* 11.2* 11.5* 11.9* 12.6*  HCT 47.8 38.4* 35.9* 37.5* 39.1 41.0  MCV 101.1* 102.1* 100.8* 100.0 101.3* 100.0  PLT 142* 101* 114* 120* 119* [id number]*   Basic Metabolic Panel:  Recent Labs Lab 04/05/16 0403 04/06/16 0359 04/07/16 0436 04/08/16 0428 04/09/16 0403  NA 141 139 143 141 141  K 3.9 4.1 4.3 4.8 4.5  CL 105 101 104 100* 97*  CO2 32 34* 37* 38* 39*  GLUCOSE 119* 163* 157* 127* 169*  BUN 17 16 15 17 17   CREATININE 0.45* 0.51* 0.41* 0.36* 0.37*  CALCIUM 8.6* 9.2 9.8 9.9 9.7  MG 1.7 2.2  --   --   --    GFR: Estimated  Creatinine Clearance: 70.3 mL/min (by C-G formula based on SCr of 0.37 mg/dL (L)). Liver Function Tests: No results for input(s): AST, ALT, ALKPHOS, BILITOT, PROT, ALBUMIN in the last 168 hours. No results for input(s): LIPASE, AMYLASE in the last 168 hours. No results for input(s): AMMONIA in the last 168 hours. Coagulation Profile: No results for input(s): INR, PROTIME in the last 168 hours. Cardiac Enzymes:  Recent Labs Lab 04/04/16 0822  TROPONINI 0.07*   BNP (last 3 results) No results for input(s): PROBNP in the last 8760 hours. HbA1C: No results for input(s): HGBA1C in the last 72 hours. CBG: No results for input(s): GLUCAP in the last 168 hours. Lipid Profile: No results for input(s): CHOL, HDL, LDLCALC, TRIG, CHOLHDL, LDLDIRECT in the last 72 hours. Thyroid Function Tests: No results for input(s): TSH, T4TOTAL, FREET4, T3FREE, THYROIDAB in the last 72  hours. Anemia Panel: No results for input(s): VITAMINB12, FOLATE, FERRITIN, TIBC, IRON, RETICCTPCT in the last 72 hours. Sepsis Labs:  Recent Labs Lab 04/04/16 [id number] 04/04/16 1220 04/04/16 1555 04/04/16 1602 04/04/16 1943  PROCALCITON  --   --  15.58  --   --   LATICACIDVEN 1.6 1.2  --  1.0 1.1    Recent Results (from the past 240 hour(s))  Blood culture (routine x 2)     Status: None (Preliminary result)   Collection Time: 04/04/16  8:32 AM  Result Value Ref Range Status   Specimen Description BLOOD LEFT HAND  Final   Special Requests   Final    BOTTLES DRAWN AEROBIC AND ANAEROBIC AEB=10CC ANA=8CC   Culture NO GROWTH 2 DAYS  Final   Report Status PENDING  Incomplete  Blood culture (routine x 2)     Status: None (Preliminary result)   Collection Time: 04/04/16  8:37 AM  Result Value Ref Range Status   Specimen Description BLOOD RIGHT HAND  Final   Special Requests BOTTLES DRAWN AEROBIC AND ANAEROBIC 10CC ANA=8CC  Final   Culture NO GROWTH 2 DAYS  Final   Report Status PENDING  Incomplete  Urine culture      Status: Abnormal   Collection Time: 04/04/16 11:08 AM  Result Value Ref Range Status   Specimen Description URINE, CLEAN CATCH  Final   Special Requests NONE  Final   Culture MULTIPLE SPECIES PRESENT, SUGGEST RECOLLECTION (A)  Final   Report Status 04/06/2016 FINAL  Final  MRSA PCR Screening     Status: None   Collection Time: 04/04/16 12:15 PM  Result Value Ref Range Status   MRSA by PCR NEGATIVE NEGATIVE Final    Comment:        The GeneXpert MRSA Assay (FDA approved for NASAL specimens only), is one component of a comprehensive MRSA colonization surveillance program. It is not intended to diagnose MRSA infection nor to guide or monitor treatment for MRSA infections.   Culture, sputum-assessment     Status: None   Collection Time: 04/05/16 11:50 PM  Result Value Ref Range Status   Specimen Description SPUTUM  Final   Special Requests NONE  Final   Sputum evaluation   Final    THIS SPECIMEN IS ACCEPTABLE. RESPIRATORY CULTURE REPORT TO FOLLOW.   Report Status 04/06/2016 FINAL  Final  Culture, respiratory (NON-Expectorated)     Status: None   Collection Time: 04/05/16 11:50 PM  Result Value Ref Range Status   Specimen Description SPUTUM  Final   Special Requests NONE  Final   Gram Stain   Final    FEW WBC PRESENT, PREDOMINANTLY PMN RARE GRAM POSITIVE COCCI IN PAIRS AND CHAINS RARE GRAM NEGATIVE COCCOBACILLI RARE GRAM POSITIVE RODS    Culture   Final    Consistent with normal respiratory flora. Performed at [facility]    Report Status 04/08/2016 FINAL  Final         Radiology Studies: No results found.      Scheduled Meds: . aspirin EC  81 mg Oral Daily  . atorvastatin  80 mg Oral Daily  . azithromycin  500 mg Intravenous Q24H  . cefTRIAXone (ROCEPHIN)  IV  1 g Intravenous Q24H  . chlorhexidine  15 mL Mouth Rinse BID  . enoxaparin (LOVENOX) injection  40 mg Subcutaneous Q24H  . feeding supplement (ENSURE ENLIVE)  237 mL Oral TID BM  .  finasteride  5 mg Oral Daily  . guaiFENesin  1,200 mg Oral BID  . ipratropium-albuterol  3 mL Nebulization Q6H  . losartan  12.5 mg Oral Daily  . mouth rinse  15 mL Mouth Rinse q12n4p  . methylPREDNISolone (SOLU-MEDROL) injection  60 mg Intravenous Q6H  . metoprolol succinate  12.5 mg Oral BID  . mometasone-formoterol  2 puff Inhalation BID  . nicotine  21 mg Transdermal Daily   Continuous Infusions:    LOS: 6 days    Time spent: 25 minutes    Kathie Dike, MD [facility]   If 7PM-7AM, please contact night-coverage [email or web address] Password [facility] 04/10/2016, 8:38 AM

## 2016-04-10 NOTE — Care Management Note (Signed)
Case Management Note  Patient Details  Name: Tommy Turner MRN: [id number] Date of Birth: 02-29-1941  If discussed at Long Length of Stay Meetings, dates discussed:  04/11/2016  Sherald Barge, RN 04/10/2016, 3:11 PM

## 2016-04-10 NOTE — Consult Note (Signed)
Consultation Note Date: 04/10/2016   Patient Name: Tommy Turner  DOB: 04-29-1941  MRN: [id number]  Age / Sex: 75 y.o., male  PCP: [name], MD Referring Physician: Kathie Dike, MD  Reason for Consultation: Establishing goals of care and Psychosocial/spiritual support  HPI/Patient Profile: 75 y.o. male  with past medical history of COPD with home O2 dependence, anxiety and depression, CAD, CHF, AAA with stenting 2 1/2 years ago, hypertension and increase cholesterol, MI 20 years ago, gastric ulcer requiring surgery 25 years ago admitted on 04/04/2016 with sepsis related to community acquired pneumonia.   Clinical Assessment and Goals of Care: Mr. Coones is resting quietly in bed, he does not make and keep eye contact. His wife, [name] is at bed side with her great niece age 70. [name] states that she and her husband have gained custody of her 2 great nieces ages 88 and 105.  March 2018 they will have been married 30 years. We talk about Mr. Kontz's health over the last year.  She shares that he is able to manage his ADL's, but she helps with his bathing. He does not manage bills, get groceries, or provide any executive function around the home. She tells me that he has lost weight over the last year and a half, but is unable to give weights.    We talk about her support system.  [name] states she is working, has family to help her, but she prefers to care for her husband without help.  We talk about her choices for rehab after this hospitalization.  [name] tells of how Mr. Daws had confusion after he spent time in rehab Jan. of this year.  She admits that he has had confusion for the last year.  I ask [name] to think about where Mr. Waver will be in 3 months, 6 months, or 1 year.  We talk about the benefits of Hospice, but she shares that her uncle had hospice and the providers were in the den talking  about his funeral arrangements where he could hear. She also says that her aunt had hospice and her uncle loved it. [name], in essence, states that she wants to have control over what happens with her husband.   [name] states, "He's what's important".   I share a diagram of the chronic illness pathway. We talk about possibilities for the future including repeat hospitalizations, rehab, the benefits of hospice, support for her during this difficult time.    We talk about advanced directives. [name] states that they have not talked about these issues, but that she knows he would not want to be kept alive on life support.   She elects to allow a natural death. She states that he has never named the place of death. [name] states her goal is to "take him home". She also talks about wanting one more trip to the ocean.  Healthcare power of attorney NEXT OF KIN  - wife, Othel Krenek, makes decisions if Mr. Fullenkamp is unable. She shares that he  has children of his own but they are not as involved in his care, and would not go against her wishes.  She has "sisters and a brother, and her daughter" to help if she needs.    SUMMARY OF RECOMMENDATIONS   continue to treat the treatable at this point. [name] states today that her husband went to [facility] for rehab January of this year and did not do well. She states that he was confused with the changes.  [name] states her preferences is home with home health at this time.   Code Status/Advance Care Planning:  DNR - [name] elects to allow a natural death today. She states that she knows what her husband would want, sharing that he would not want to be kept alive on machines.  Symptom Management:   per hospitalist, no additional management needed.  Palliative Prophylaxis:   Aspiration, Oral Care and Turn Reposition  Additional Recommendations (Limitations, Scope, Preferences):  Continue to treat the treatable at this point, but no extraordinary measures such as  CPR or intubation.  Psycho-social/Spiritual:   Desire for further Chaplaincy support:yes  Additional Recommendations: Caregiving  Support/Resources, Education on Hospice and ICU Family Guide  Prognosis:   < 6 months, would not be surprising based on functional decline, weight loss over the last year and a half, extended ICU stay of 6 days at this point, and two hospitalizations in the last 6 months.   Discharge Planning: To be determined. [name] states her preferences for Mr. Kemmer to return to their home, with the benefits of advanced homecare. We discussed the option for SNF for rehab, we also discuss hospice for future needs.      Primary Diagnoses: Present on Admission: . CAP (community acquired pneumonia) . Sepsis ([place]) . Acute on chronic respiratory failure with hypoxia ([place]) . Anxiety . Chronic systolic CHF (congestive heart failure) ([place]) . COPD with acute exacerbation ([place]) . Essential hypertension . Chronic pain syndrome . Hypotension . CAD (coronary artery disease) . Acute encephalopathy   I have reviewed the medical record, interviewed the patient and family, and examined the patient. The following aspects are pertinent.  Past Medical History:  Diagnosis Date  . AAA (abdominal aortic aneurysm) ([place])   . Aneurysm ([place])   . Anxiety    takes Xanax daily as needed  . CAD (coronary artery disease)   . CHF (congestive heart failure) ([place])   . COPD (chronic obstructive pulmonary disease) (HCC)    Albuterol inhaler and neb daily as needed;takes SPiriva daily  . Depression    takes Celexa daily  . Foley catheter in place   . History of shingles   . Hyperlipidemia    takes Atorvastatin daily  . Hypertension    takes Metoprolol and Benazepril  daily  . Myocardial infarction    20+yrs ago  . Oxygen dependent    2L/[place]   . Peripheral edema    takes Furosemide daily  . Shortness of breath     on oxygen  2 liters continuous  . Ulcer ([place])    gastric    Social History   Social History  . Marital status: Married    Spouse name: N/A  . Number of children: N/A  . Years of education: N/A   Social History Main Topics  . Smoking status: Former Smoker    Packs/day: 0.25    Years: 58.00    Types: Cigarettes    Quit date: 11/02/2014  . Smokeless tobacco: Never Used  Comment: vapor ciggs  . Alcohol use No  . Drug use: No  . Sexual activity: Not Currently   Other Topics Concern  . None   Social History Narrative  . None   Family History  Problem Relation Age of Onset  . Hypertension Brother   . Heart disease Brother     Heart Disease before age 79  . Heart attack Brother   . Cancer Brother   . Cancer Mother   . Diabetes Mother   . Heart disease Mother     Heart Disease before age 18  . Hypertension Mother   . Heart attack Mother   . Cancer Father     prostate  . Hypertension Father   . COPD Sister   . Diabetes Sister   . Heart disease Sister   . Hypertension Sister   . Heart attack Sister   . Colon cancer Neg Hx    Scheduled Meds: . aspirin EC  81 mg Oral Daily  . atorvastatin  80 mg Oral Daily  . azithromycin  500 mg Intravenous Q24H  . cefTRIAXone (ROCEPHIN)  IV  1 g Intravenous Q24H  . chlorhexidine  15 mL Mouth Rinse BID  . enoxaparin (LOVENOX) injection  40 mg Subcutaneous Q24H  . feeding supplement (ENSURE ENLIVE)  237 mL Oral TID BM  . finasteride  5 mg Oral Daily  . guaiFENesin  1,200 mg Oral BID  . ipratropium-albuterol  3 mL Nebulization Q6H  . losartan  12.5 mg Oral Daily  . mouth rinse  15 mL Mouth Rinse q12n4p  . methylPREDNISolone (SOLU-MEDROL) injection  60 mg Intravenous Q6H  . metoprolol succinate  12.5 mg Oral BID  . mometasone-formoterol  2 puff Inhalation BID  . nicotine  21 mg Transdermal Daily   Continuous Infusions: PRN Meds:.acetaminophen **OR** acetaminophen, albuterol, HYDROcodone-acetaminophen, LORazepam, ondansetron **OR** ondansetron (ZOFRAN) IV Medications Prior to  Admission:  Prior to Admission medications   Medication Sig Start Date End Date Taking? Authorizing Provider  albuterol (PROVENTIL HFA;VENTOLIN HFA) 108 (90 Base) MCG/ACT inhaler Inhale 2 puffs into the lungs every 4 (four) hours as needed for wheezing or shortness of breath.   Yes Historical Provider, MD  albuterol (PROVENTIL) (2.5 MG/3ML) 0.083% nebulizer solution Take 3 mLs (2.5 mg total) by nebulization every 4 (four) hours as needed for wheezing or shortness of breath. 12/14/15  Yes [name], MD  aspirin EC 81 MG tablet Take 81 mg by mouth daily.    Yes Historical Provider, MD  atorvastatin (LIPITOR) 80 MG tablet Take 1 tablet (80 mg total) by mouth daily. 06/06/15  Yes [name], MD  benazepril (LOTENSIN) 5 MG tablet Take 1 tablet (5 mg total) by mouth daily. 02/08/16  Yes [name], MD  budesonide-formoterol (SYMBICORT) 160-4.5 MCG/ACT inhaler Inhale 2 puffs into the lungs 2 (two) times daily. 06/06/15  Yes [name], MD  clonazePAM (KLONOPIN) 0.5 MG tablet TAKE 1 TABLET BY MOUTH TWICE DAILY AS NEEDED FOR ANXIETY 03/05/16  Yes [name], MD  finasteride (PROSCAR) 5 MG tablet Take 5 mg by mouth daily.   Yes Historical Provider, MD  metoprolol succinate (TOPROL-XL) 25 MG 24 hr tablet TAKE 1/2 TABLET BY MOUTH 2 TIMES DAILY. 04/03/16  Yes [name], MD  potassium chloride SA (K-DUR,KLOR-CON) 20 MEQ tablet Take 1 tablet (20 mEq total) by mouth daily. 02/14/16  Yes [name], MD  tiotropium (SPIRIVA) 18 MCG inhalation capsule Place 18 mcg into inhaler and  inhale daily.   Yes Historical Provider, MD  torsemide (DEMADEX) 20 MG tablet Take 2 tablets (40 mg total) by mouth daily. 03/19/16  Yes [name], MD  HYDROcodone-acetaminophen (NORCO) 10-325 MG tablet Take 0.5 tablets by mouth every 6 (six) hours as needed for severe pain. 01/13/16   [name], MD  nitroGLYCERIN (NITROSTAT) 0.4 MG SL tablet Place 1 tablet (0.4 mg total) under the tongue every 5 (five)  minutes as needed for chest pain. 08/02/15   [name], MD   No Known Allergies Review of Systems  Unable to perform ROS: Mental status change    Physical Exam  Constitutional:  Frail and thin, chronically ill appearing, encephalopathic  HENT:  Head: Normocephalic and atraumatic.  Cardiovascular: Normal rate and regular rhythm.   Pulmonary/Chest: Effort normal. No respiratory distress.  Hi flow nasal cannula at 70%  Abdominal: Soft. He exhibits no distension.  Musculoskeletal:  Muscle wasting  Neurological:  Opens eyes to command, does not make or keep eye contact, unable to communicate effectively  Skin: Skin is warm and dry.  Nursing note and vitals reviewed.   Vital Signs: BP 128/76   Pulse 87   Temp 97.3 F (36.3 C) (Oral)   Resp (!) 21   Ht 5\' 7"  (1.702 m)   Wt 63.3 kg (139 lb 8.8 oz)   SpO2 92%   BMI 21.86 kg/m  Pain Assessment: No/denies pain POSS *See Group Information*: S-Acceptable,Sleep, easy to arouse Pain Score: 0-No pain   SpO2: SpO2: 92 % O2 Device:SpO2: 92 % O2 Flow Rate: .O2 Flow Rate (L/min): 35 L/min (decreased to 30)  IO: Intake/output summary:  Intake/Output Summary (Last 24 hours) at 04/10/16 1407 Last data filed at 04/10/16 1403  Gross per 24 hour  Intake              780 ml  Output             2650 ml  Net            -1870 ml    LBM: Last BM Date: 04/03/16 Baseline Weight: Weight: 61.7 kg (136 lb) Most recent weight: Weight: 63.3 kg (139 lb 8.8 oz)     Palliative Assessment/Data:   Flowsheet Rows   Flowsheet Row Most Recent Value  Intake Tab  Referral Department  Hospitalist  Unit at Time of Referral  ICU  Palliative Care Primary Diagnosis  Cardiac  Date Notified  04/09/16  Palliative Care Type  New Palliative care  Reason for referral  Clarify Goals of Care  Date of Admission  04/04/16  Date first seen by Palliative Care  04/09/16  # of days Palliative referral response time  0 Day(s)  # of days IP prior to Palliative  referral  5  Clinical Assessment  Palliative Performance Scale Score  20%  Pain Max last 24 hours  Not able to report  Pain Min Last 24 hours  Not able to report  Dyspnea Max Last 24 Hours  Not able to report  Dyspnea Min Last 24 hours  Not able to report  Psychosocial & Spiritual Assessment  Palliative Care Outcomes  Patient/Family meeting held?  Yes  Who was at the meeting?  brief meeting today with wife at bedside. family meeting 12/5 at 1000  Palliative Care follow-up planned  -- [follow up while at [facility]]      Time In: 1000 Time Out: 1050 Time Total: 50 minutes Greater than 50%  of this time was  spent counseling and coordinating care related to the above assessment and plan.  Signed by: Drue Novel, NP   Please contact Palliative Medicine Team phone at [phone] for questions and concerns.  For individual provider: See Shea Evans

## 2016-04-10 NOTE — Progress Notes (Signed)
Patient has had a great day today. This morning he was a little agitated and restless, trying to climb out of his bed and pull at his lines. His wife was able to calm him down and from 1100 to 1900 he has been calm and resting. He drunk 1 whole ensure today and he is looking much better and states he feels better.   Patient has had 2 bowel movements today after enema. I did not have to give any ativan on my shift. Patient has only had slight confusion today. Much improvement from last night.  Margaret Pyle, RN

## 2016-04-11 ENCOUNTER — Inpatient Hospital Stay (HOSPITAL_COMMUNITY): Payer: 59

## 2016-04-11 DIAGNOSIS — G9340 Encephalopathy, unspecified: Secondary | ICD-10-CM

## 2016-04-11 LAB — BASIC METABOLIC PANEL
Anion gap: 4 — ABNORMAL LOW (ref 5–15)
BUN: 17 mg/dL (ref 6–20)
CHLORIDE: 96 mmol/L — AB (ref 101–111)
CO2: 41 mmol/L — ABNORMAL HIGH (ref 22–32)
CREATININE: 0.36 mg/dL — AB (ref 0.61–1.24)
Calcium: 9.5 mg/dL (ref 8.9–10.3)
GFR calc Af Amer: >60 mL/min (ref >60)
GLUCOSE: 158 mg/dL — AB (ref 65–99)
POTASSIUM: 4.2 mmol/L (ref 3.5–5.1)
SODIUM: 141 mmol/L (ref 135–145)

## 2016-04-11 LAB — CULTURE, BLOOD (ROUTINE X 2)
CULTURE: NO GROWTH
Culture: NO GROWTH

## 2016-04-11 LAB — CBC
HCT: 43.4 % (ref 39.0–52.0)
Hemoglobin: 14 g/dL (ref 13.0–17.0)
MCH: 31.3 pg (ref 26.0–34.0)
MCHC: 32.3 g/dL (ref 30.0–36.0)
MCV: 96.9 fL (ref 78.0–100.0)
PLATELETS: 144 10*3/uL — AB (ref 150–400)
RBC: 4.48 MIL/uL (ref 4.22–5.81)
RDW: 12.7 % (ref 11.5–15.5)
WBC: 10.3 10*3/uL (ref 4.0–10.5)

## 2016-04-11 LAB — GLUCOSE, CAPILLARY: Glucose-Capillary: 163 mg/dL — ABNORMAL HIGH (ref 65–99)

## 2016-04-11 MED ORDER — METHYLPREDNISOLONE SODIUM SUCC 125 MG IJ SOLR
60.0000 mg | Freq: Two times a day (BID) | INTRAMUSCULAR | Status: DC
  Administered 2016-04-11 – 2016-04-13 (×4): 60 mg via INTRAVENOUS
  Filled 2016-04-11 (×4): qty 2

## 2016-04-11 NOTE — Progress Notes (Addendum)
Patient ID: Tommy Turner, male   DOB: April 29, 1941, 75 y.o.   MRN: [id number]    Patient Name: Tommy Turner Date of Encounter: 04/11/2016  Primary Cardiologist: Dr. Raeford Razor Problem List     Principal Problem:   Sepsis [facility]) Active Problems:   Essential hypertension   CAD (coronary artery disease)   Anxiety   COPD with acute exacerbation (HCC)   Chronic pain syndrome   Acute on chronic respiratory failure with hypoxia (HCC)   Chronic systolic CHF (congestive heart failure) ([place])   Hypotension   CAP (community acquired pneumonia)   Anemia   Thrombocytopenia (HCC)   Fever   Elevated troponin   Acute encephalopathy   Palliative care encounter   Goals of care, counseling/discussion   DNR (do not resuscitate) discussion    Subjective   Sitting up eating. Feeling better. More alert.  Inpatient Medications    Scheduled Meds: . aspirin EC  81 mg Oral Daily  . atorvastatin  80 mg Oral Daily  . azithromycin  500 mg Intravenous Q24H  . cefTRIAXone (ROCEPHIN)  IV  1 g Intravenous Q24H  . chlorhexidine  15 mL Mouth Rinse BID  . enoxaparin (LOVENOX) injection  40 mg Subcutaneous Q24H  . feeding supplement (ENSURE ENLIVE)  237 mL Oral TID BM  . finasteride  5 mg Oral Daily  . guaiFENesin  1,200 mg Oral BID  . ipratropium-albuterol  3 mL Nebulization Q6H  . losartan  12.5 mg Oral Daily  . mouth rinse  15 mL Mouth Rinse q12n4p  . methylPREDNISolone (SOLU-MEDROL) injection  60 mg Intravenous Q6H  . metoprolol succinate  12.5 mg Oral BID  . mometasone-formoterol  2 puff Inhalation BID  . nicotine  21 mg Transdermal Daily    PRN Meds: acetaminophen **OR** acetaminophen, albuterol, HYDROcodone-acetaminophen, LORazepam, ondansetron **OR** ondansetron (ZOFRAN) IV   Vital Signs    Vitals:   04/11/16 0400 04/11/16 0500 04/11/16 0743 04/11/16 0751  BP:      Pulse:      Resp:      Temp: 97.9 F (36.6 C)     TempSrc: Oral     SpO2:   (!) 89% 90%  Weight:   132 lb 11.5 oz (60.2 kg)    Height:        Intake/Output Summary (Last 24 hours) at 04/11/16 0759 Last data filed at 04/11/16 0500  Gross per 24 hour  Intake              600 ml  Output             1650 ml  Net            -1050 ml   Filed Weights   04/09/16 0500 04/10/16 0500 04/11/16 0500  Weight: 143 lb 15.4 oz (65.3 kg) 139 lb 8.8 oz (63.3 kg) 132 lb 11.5 oz (60.2 kg)    Physical Exam   GEN: Thin, chronically ill appearing elderly male, in no acute distress.  Neck: Supple, no JVD, carotid bruits, or masses. Cardiac: RRR, distant heart sounds, no murmurs, rubs, or gallops. No clubbing, cyanosis, edema.  Radials/DP/PT 2+ and equal bilaterally.  Respiratory:  Decreased breath sounds, significantly so on the left with rhonchi on right. GI: Soft, nontender, nondistended, BS + x 4. Skin: warm and dry, no rash. Psych: AAOx3.  Normal affect.  Labs    CBC  Recent Labs  04/10/16 0819 04/11/16 0356  WBC 11.2* 10.3  HGB 13.4  14.0  HCT 42.6 43.4  MCV 98.2 96.9  PLT 139* [id number]*   Basic Metabolic Panel  Recent Labs  04/10/16 0819 04/11/16 0356  NA 140 141  K 4.4 4.2  CL 96* 96*  CO2 40* 41*  GLUCOSE 147* 158*  BUN 16 17  CREATININE 0.30* 0.36*  CALCIUM 9.5 9.5    Telemetry    NSR with PVC's. Brief episode NSVT.   Radiology    Dg Chest Port 1 View  Result Date: 04/11/2016 CLINICAL DATA:  Difficulty breathing EXAM: PORTABLE CHEST 1 VIEW COMPARISON:  04/08/2016 FINDINGS: Cardiac shadow is obscured by opacification in the left hemi thorax. There is complete opacification of the left hemi thorax noted with significant volume loss and midline shift to the left. The right lung is hyperaerated without focal infiltrate. Some mild interstitial thickening is noted. IMPRESSION: Complete opacification of the left hemi thorax with volume loss likely related to mucous plugging and consolidation. Hyper expansion of the right lung is noted. Electronically Signed   By: Inez Catalina  M.D.   On: 04/11/2016 07:41    Cardiac Studies   Echocardiogram 04/09/2016: Study Conclusions   - Left ventricle: The cavity size was mildly dilated. Wall   thickness was increased in a pattern of mild LVH. Systolic   function was severely reduced. The estimated ejection fraction   was 20%. There is akinesis of the inferior and inferoseptal   myocardium. There is dyskinesis of the mid-apicalanteroseptal and   apical myocardium. The study is not technically sufficient to   allow evaluation of LV diastolic function. - Aortic valve: Mildly calcified annulus. Trileaflet; mildly   calcified leaflets. - Aortic root: The aortic root was mildly dilated. - Mitral valve: Calcified annulus. Mildly thickened leaflets .   There was mild regurgitation. - Left atrium: The atrium was severely dilated. - Right ventricle: Systolic function was moderately reduced. - Right atrium: The atrium was mildly dilated. Central venous   pressure (est): 3 mm Hg. - Atrial septum: No defect or patent foramen ovale was identified. - Tricuspid valve: There was trivial regurgitation. - Pulmonary arteries: Systolic pressure could not be accurately   estimated. - Pericardium, extracardiac: There was no pericardial effusion.   Impressions:   - Mild LVH with mild chamber dilatation and LVEF approximately 20%.   Wall motion abnormalities consistent with ischemic   cardiomyopathy. Indeterminate diastolic function. Severe left   atrial enlargement. Mitral annular calcification with mild mitral   regurgitation. Mildly dilated aortic root. Moderately reduced   right ventricular contraction. Trivial tricuspid regurgitation.   Mild right atrial enlargement.  Patient Profile     75 year old male with multivessel CAD status post previous CABG, chronic systolic heart failure with ischemic cardiomyopathy (declined ICD), RV dysfunction, and severe COPD with acute on chronic combined respiratory failure and sepsis in the  setting of community-acquired pneumonia. He had a mild troponin I increase most consistent with demand ischemia. Follow-up echocardiogram shows stable LVEF approximately 20%. He is being managed medically.   Assessment & Plan    1. Sepsis felt due to CAP +/- AECOPD - per primary team. Continues on antibiotics, steroids, nebs. Follow-up chest x-ray today complete opacification of the left hemi thorax with volume loss likely related to mucous plugging and consolidation, does not look to be fluid overloaded.   2. Hypotension - Improved. Blood pressure has allowed titration of cardiac medications. Was on lotensin 5 mg daily, demadex 40 mg daily and toprol xl 12.5 mg BID PTA. Now on  losartan 12.5 daily and Toprol XL. Demadex still on hold for now, but no evidence of progressive fluid overload.   3. History of ischemic cardiomyopathy with chronic systolic heart failure. LVEF 20% on echo 04/09/16 which is stable. Has refused ICD in past. Palliative care.   4. Minimally elevated troponin (with history of known CAD and nonischemic nuc 2015) - per notes, felt c/w demand ischemia due to sepsis. Continue ASA and statin.   5. PVCs/brief NSVT. Back on beta blocker  Signed, Ermalinda Barrios, PA-C  04/11/2016, 7:59 AM   Attending note:  Patient seen and examined. Discussed the case with Ms. Bonnell Public PA-C. I agree with her above findings. Patient has become more alert in general, less short of breath, although his follow-up chest x-ray shows significant opacification on the left side suggesting substantial consolidation and/or plugging. From a cardiac perspective he has been hemodynamically stable, remains in sinus rhythm. Plan to continue aspirin, Lipitor, Toprol-XL, and Cozaar. He had previously been on Demadex as an outpatient which can be resumed eventually, although at this point not showing evidence of progressive volume overload. Out greater than in last 48 hours. Creatinine stable.  Satira Sark, M.D.,  F.A.C.C.

## 2016-04-11 NOTE — Progress Notes (Signed)
PROGRESS NOTE    Tommy Turner  [id number] DOB: [birth year] DOA: 04/04/2016 PCP: Sallee Lange, MD   Brief Narrative: 75 y.o. male with medical history significant of AAA, anxiety, CAD, HTN, HLD, chronic systolic CHF EF 0000000, COPD oxygen dependent (2L), MI, and tobacco abuse presented with complaint of gradually worsening shortness of breath. He was admitted to the hospital for sepsis related to pneumonia as well as COPD exacerbation. Initially he required bipap, but has since been weaned off. Hospital course has been complicated by development of confusion and agitation. He is receiving intermittent ativan. Pulmonology and cardiology following. Palliative care also consulted to discuss goals of care.  Assessment & Plan:   # Sepsis due to community acquired pneumonia: -Chest x-ray with left lung opacity. Pulmonary consult following. -Completing 7 days of ceftriaxone and azithromycin today. Sepsis seems improving. Blood culture negative. -possible mucus suctioning and incentive spirometer, respiratory therapist consulted.   #Acute on chronic hypoxic respiratory failure: Due to pneumonia and possible COPD exacerbation: -Required BiPAP but currently on high flow oxygen. -Continue bronchodilators and respiratory therapy. -Wean Solu-Medrol from appendectomy 6 hour to every 12  # History of ischemic cardiomyopathy with chronic systolic heart failure:LVEF 20% on echo 04/09/16 which is stable. Has refused ICD in past. Cardiology following. -Palliative consult evaluation ongoing. Patient is currently DO NOT RESUSCITATE.  #Mild elevation in troponin (0.07) likely demand ischemia in the setting of sepsis. Patient is on aspirin and statin.  # PVCs/brief NSVT. continue beta blocker  # Acute encephalopathy metabolic versus medication related: Continue to provide supportive care. Ammonia level 26.  #Essential hypertension: Continue losartan low-dose and metoprolol. Monitor blood  pressure.  #Hyperlipidemia: Continue statin.  #Mild thrombocytopenia, chronic: No sign of bleeding. Platelet 144 today.   Active Problems:   Essential hypertension   CAD (coronary artery disease)   Anxiety   COPD with acute exacerbation (HCC)   Chronic pain syndrome   Acute on chronic respiratory failure with hypoxia (HCC)   Chronic systolic CHF (congestive heart failure) (HCC)   Hypotension   CAP (community acquired pneumonia)   Anemia   Thrombocytopenia (HCC)   Fever   Elevated troponin   Acute encephalopathy   Palliative care encounter   Goals of care, counseling/discussion   DNR (do not resuscitate) discussion  DVT prophylaxis:Lovenox subcutaneous  Code Status:DO NOT RESUSCITATE  Family Communication:Discussed with the patient's wife at bedside  Disposition Plan:Likely discharge home with home care versus rehabilitation in 2-3 days    Consultants:   Pulmonologist and cardiologist  Procedures:BiPAP as needed  Antimicrobials:Completing 7th days of IV ceftriaxone and azithromycin  Subjective:Patient was seen and examined at bedside. Patient reported doing good. Has weakness stable shortness of breath. Denied chest pain, cough, fever, nausea, vomiting, abdominal pain. Wife at bedside.   Objective: Vitals:   04/11/16 1000 04/11/16 1100 04/11/16 1142 04/11/16 1200  BP: 124/75 125/71  140/73  Pulse: 88 70  85  Resp: (!) 23 20  (!) 21  Temp:   98.4 F (36.9 C)   TempSrc:   Oral   SpO2: 96% 92%  92%  Weight:      Height:        Intake/Output Summary (Last 24 hours) at 04/11/16 1323 Last data filed at 04/11/16 1307  Gross per 24 hour  Intake              530 ml  Output             2275 ml  Net            -  1745 ml   Filed Weights   04/09/16 0500 04/10/16 0500 04/11/16 0500  Weight: 65.3 kg (143 lb 15.4 oz) 63.3 kg (139 lb 8.8 oz) 60.2 kg (132 lb 11.5 oz)    Examination:  General exam:Ill-looking elderly male lying on bed on high flow oxygen   Respiratory system:Bilateral diffuse expiratory wheeze, decreased breath sound on the left side  Cardiovascular system: S1 & S2 heard, RRR.  No pedal edema. Gastrointestinal system: Abdomen is nondistended, soft and nontender. Normal bowel sounds heard. Central nervous system: Alert awake and following commands  Extremities: Symmetric 5 x 5 power. Skin: No rashes, lesions or ulcers Psychiatry: Judgement and insight appearimpaired     Data Reviewed: I have personally reviewed following labs and imaging studies  CBC:  Recent Labs Lab 04/07/16 0436 04/08/16 0428 04/09/16 0403 04/10/16 0819 04/11/16 0356  WBC 12.7* 9.3 7.9 11.2* 10.3  HGB 11.5* 11.9* 12.6* 13.4 14.0  HCT 37.5* 39.1 41.0 42.6 43.4  MCV 100.0 101.3* 100.0 98.2 96.9  PLT 120* 119* 134* 139* 123456*   Basic Metabolic Panel:  Recent Labs Lab 04/05/16 0403 04/06/16 0359 04/07/16 0436 04/08/16 0428 04/09/16 0403 04/10/16 0819 04/11/16 0356  NA 141 139 143 141 141 140 141  K 3.9 4.1 4.3 4.8 4.5 4.4 4.2  CL 105 101 104 100* 97* 96* 96*  CO2 32 34* 37* 38* 39* 40* 41*  GLUCOSE 119* 163* 157* 127* 169* 147* 158*  BUN 17 16 15 17 17 16 17   CREATININE 0.45* 0.51* 0.41* 0.36* 0.37* 0.30* 0.36*  CALCIUM 8.6* 9.2 9.8 9.9 9.7 9.5 9.5  MG 1.7 2.2  --   --   --   --   --    GFR: Estimated Creatinine Clearance: 66.9 mL/min (by C-G formula based on SCr of 0.36 mg/dL (L)). Liver Function Tests: No results for input(s): AST, ALT, ALKPHOS, BILITOT, PROT, ALBUMIN in the last 168 hours. No results for input(s): LIPASE, AMYLASE in the last 168 hours.  Recent Labs Lab 04/10/16 0846  AMMONIA 26   Coagulation Profile: No results for input(s): INR, PROTIME in the last 168 hours. Cardiac Enzymes: No results for input(s): CKTOTAL, CKMB, CKMBINDEX, TROPONINI in the last 168 hours. BNP (last 3 results) No results for input(s): PROBNP in the last 8760 hours. HbA1C: No results for input(s): HGBA1C in the last 72  hours. CBG: No results for input(s): GLUCAP in the last 168 hours. Lipid Profile: No results for input(s): CHOL, HDL, LDLCALC, TRIG, CHOLHDL, LDLDIRECT in the last 72 hours. Thyroid Function Tests: No results for input(s): TSH, T4TOTAL, FREET4, T3FREE, THYROIDAB in the last 72 hours. Anemia Panel: No results for input(s): VITAMINB12, FOLATE, FERRITIN, TIBC, IRON, RETICCTPCT in the last 72 hours. Sepsis Labs:  Recent Labs Lab 04/04/16 1555 04/04/16 1602 04/04/16 1943  PROCALCITON 15.58  --   --   LATICACIDVEN  --  1.0 1.1    Recent Results (from the past 240 hour(s))  Blood culture (routine x 2)     Status: None   Collection Time: 04/04/16  8:32 AM  Result Value Ref Range Status   Specimen Description BLOOD LEFT HAND  Final   Special Requests   Final    BOTTLES DRAWN AEROBIC AND ANAEROBIC AEB=10CC ANA=8CC   Culture NO GROWTH 7 DAYS  Final   Report Status 04/11/2016 FINAL  Final  Blood culture (routine x 2)     Status: None   Collection Time: 04/04/16  8:37 AM  Result Value Ref Range  Status   Specimen Description BLOOD RIGHT HAND  Final   Special Requests BOTTLES DRAWN AEROBIC AND ANAEROBIC 10CC ANA=8CC  Final   Culture NO GROWTH 7 DAYS  Final   Report Status 04/11/2016 FINAL  Final  Urine culture     Status: Abnormal   Collection Time: 04/04/16 11:08 AM  Result Value Ref Range Status   Specimen Description URINE, CLEAN CATCH  Final   Special Requests NONE  Final   Culture MULTIPLE SPECIES PRESENT, SUGGEST RECOLLECTION (A)  Final   Report Status 04/06/2016 FINAL  Final  MRSA PCR Screening     Status: None   Collection Time: 04/04/16 12:15 PM  Result Value Ref Range Status   MRSA by PCR NEGATIVE NEGATIVE Final    Comment:        The GeneXpert MRSA Assay (FDA approved for NASAL specimens only), is one component of a comprehensive MRSA colonization surveillance program. It is not intended to diagnose MRSA infection nor to guide or monitor treatment for MRSA  infections.   Culture, sputum-assessment     Status: None   Collection Time: 04/05/16 11:50 PM  Result Value Ref Range Status   Specimen Description SPUTUM  Final   Special Requests NONE  Final   Sputum evaluation   Final    THIS SPECIMEN IS ACCEPTABLE. RESPIRATORY CULTURE REPORT TO FOLLOW.   Report Status 04/06/2016 FINAL  Final  Culture, respiratory (NON-Expectorated)     Status: None   Collection Time: 04/05/16 11:50 PM  Result Value Ref Range Status   Specimen Description SPUTUM  Final   Special Requests NONE  Final   Gram Stain   Final    FEW WBC PRESENT, PREDOMINANTLY PMN RARE GRAM POSITIVE COCCI IN PAIRS AND CHAINS RARE GRAM NEGATIVE COCCOBACILLI RARE GRAM POSITIVE RODS    Culture   Final    Consistent with normal respiratory flora. Performed at [facility]    Report Status 04/08/2016 FINAL  Final         Radiology Studies: Dg Chest Port 1 View  Result Date: 04/11/2016 CLINICAL DATA:  Difficulty breathing EXAM: PORTABLE CHEST 1 VIEW COMPARISON:  04/08/2016 FINDINGS: Cardiac shadow is obscured by opacification in the left hemi thorax. There is complete opacification of the left hemi thorax noted with significant volume loss and midline shift to the left. The right lung is hyperaerated without focal infiltrate. Some mild interstitial thickening is noted. IMPRESSION: Complete opacification of the left hemi thorax with volume loss likely related to mucous plugging and consolidation. Hyper expansion of the right lung is noted. Electronically Signed   By: Inez Catalina M.D.   On: 04/11/2016 07:41        Scheduled Meds: . aspirin EC  81 mg Oral Daily  . atorvastatin  80 mg Oral Daily  . chlorhexidine  15 mL Mouth Rinse BID  . enoxaparin (LOVENOX) injection  40 mg Subcutaneous Q24H  . feeding supplement (ENSURE ENLIVE)  237 mL Oral TID BM  . finasteride  5 mg Oral Daily  . guaiFENesin  1,200 mg Oral BID  . ipratropium-albuterol  3 mL Nebulization Q6H  .  losartan  12.5 mg Oral Daily  . mouth rinse  15 mL Mouth Rinse q12n4p  . methylPREDNISolone (SOLU-MEDROL) injection  60 mg Intravenous Q6H  . metoprolol succinate  12.5 mg Oral BID  . mometasone-formoterol  2 puff Inhalation BID  . nicotine  21 mg Transdermal Daily   Continuous Infusions:   LOS: 7 days  Shanan Fitzpatrick Tanna Furry, MD [facility] Pager [phone]  If 7PM-7AM, please contact night-coverage [email or web address] Password [email or web address] 04/11/2016, 1:23 PM

## 2016-04-11 NOTE — Evaluation (Signed)
Physical Therapy Evaluation Patient Details Name: Tommy Turner MRN: [id number] DOB: January 18, 1941 Today's Date: 04/11/2016   History of Present Illness  75 y.o. male with medical history significant of AAA, anxiety, CAD, HTN, HLD, chronic systolic CHF EF 0000000, COPD oxygen dependent (2L), MI, and tobacco abuse presented with complaint of gradually worsening shortness of breath. He was admitted to the hospital for sepsis related to pneumonia as well as COPD exacerbation. Initially he required bipap, but has since been weaned off. Hospital course has been complicated by development of confusion and agitation. He is receiving intermittent ativan. Pulmonology and cardiology following. Palliative care also consulted to discuss goals of care.  Clinical Impression  Pt received in bed, wife present, and pt is agreeable to PT evaluation.  Pt states that he normally uses a cane for ambulation at home, but he is independent with dressing and bathing.  He only drives very short distances, up to 1.5 miles.  Pt wears O2 at home - he states he wears it all the time in the house, and only occasionally when he is outside the house.  During PT evaluation, he is on 30L of high flow O2 at 90% at rest.  He was able to perform supine<>sit and transfer bed<>chair with Min guard assistance, but he desaturated down to 86%.  He required 3-4 min for him to recover back to 90% after resting in the chair.  Recommend that pt d/c home with HHPT, to progress pt's strength and endurance.      Follow Up Recommendations Home health PT    Equipment Recommendations  Other (comment) (TBD)    Recommendations for Other Services       Precautions / Restrictions Precautions Precautions: Fall Precaution Comments: No falls, but recent immobility in the hospital.  Restrictions Weight Bearing Restrictions: No      Mobility  Bed Mobility Overal bed mobility: Needs Assistance Bed Mobility: Supine to Sit     Supine to sit:  Supervision;HOB elevated     General bed mobility comments: vc's to sit on the EOB for a moment prior to standing to transfer.   Transfers Overall transfer level: Needs assistance Equipment used: None Transfers: Stand Pivot Transfers   Stand pivot transfers: Min guard       General transfer comment: Pt demonstrates desaturation down to 86% during transfer.   Ambulation/Gait Ambulation/Gait assistance:  (NA due to high level of O2 requirements with desaturation during transfer. )              Stairs            Wheelchair Mobility    Modified Rankin (Stroke Patients Only)       Balance Overall balance assessment: Needs assistance Sitting-balance support: Bilateral upper extremity supported;Feet supported Sitting balance-Leahy Scale: Fair     Standing balance support: No upper extremity supported Standing balance-Leahy Scale: Fair                               Pertinent Vitals/Pain Pain Assessment: No/denies pain    Home Living   Living Arrangements: Spouse/significant other   Type of Home: Mobile home Home Access: Stairs to enter   Entrance Stairs-Number of Steps: 2 Home Layout: One level Home Equipment: Walker - 2 wheels;Cane - single point;Shower seat (O2 - inside all the time, and outside some of the time.  2L)      Prior Function     Gait /  Transfers Assistance Needed: cane occasionally for ambulation.    ADL's / Homemaking Assistance Needed: Pt states he was independent with dressing and bathing.  Driving occasionally up to 1.5 miles.          Hand Dominance   Dominant Hand: Right    Extremity/Trunk Assessment   Upper Extremity Assessment: Generalized weakness           Lower Extremity Assessment: Generalized weakness         Communication      Cognition Arousal/Alertness: Awake/alert Behavior During Therapy: WFL for tasks assessed/performed Overall Cognitive Status: History of cognitive impairments - at  baseline (unable to tell me why he is here or the date. )                      General Comments      Exercises     Assessment/Plan    PT Assessment Patient needs continued PT services  PT Problem List Decreased strength;Decreased activity tolerance;Decreased balance;Decreased mobility;Decreased safety awareness;Decreased knowledge of use of DME;Decreased knowledge of precautions;Decreased skin integrity;Cardiopulmonary status limiting activity          PT Treatment Interventions DME instruction;Gait training;Functional mobility training;Therapeutic activities;Therapeutic exercise;Balance training;Cognitive remediation;Patient/family education    PT Goals (Current goals can be found in the Care Plan section)  Acute Rehab PT Goals Patient Stated Goal: To go home.  PT Goal Formulation: With patient/family Time For Goal Achievement: 04/18/16 Potential to Achieve Goals: Fair    Frequency Min 4X/week   Barriers to discharge        Co-evaluation               End of Session Equipment Utilized During Treatment: Gait belt;Oxygen Activity Tolerance: Treatment limited secondary to medical complications (Comment) Patient left: in chair;with call bell/phone within reach;with family/visitor present Nurse Communication: Mobility status (Mysti, RN notified of pt's mobiltiy status and desaturation with transfer.  Mobility sheet in room. )    Functional Assessment Tool Used: [employer]	[place] AM-PAC "6-clicks"  Functional Limitation: Mobility: Walking and moving around Mobility: Walking and Moving Around Current Status [phone]): At least 40 percent but less than 60 percent impaired, limited or restricted Mobility: Walking and Moving Around Goal Status [phone]): At least 20 percent but less than 40 percent impaired, limited or restricted    Time: [id number] PT Time Calculation (min) (ACUTE ONLY): 27 min   Charges:   PT Evaluation $PT Eval Low Complexity: 1 Procedure PT  Treatments $Therapeutic Activity: 8-22 mins   PT G Codes:   PT G-Codes **NOT FOR INPATIENT CLASS** Functional Assessment Tool Used: [employer] "6-clicks"  Functional Limitation: Mobility: Walking and moving around Mobility: Walking and Moving Around Current Status [phone]): At least 40 percent but less than 60 percent impaired, limited or restricted Mobility: Walking and Moving Around Goal Status [phone]): At least 20 percent but less than 40 percent impaired, limited or restricted    Beth Cristalle Rohm, PT, DPT X: [phone]

## 2016-04-11 NOTE — Progress Notes (Signed)
Subjective: He is substantially better today. He's awake alert and cooperative. He is on high flow nasal cannula and his oxygen saturation is in the low 90s. He says he's been able to cough productively and feels like he can breathe better since that happened.  Objective: Vital signs in last 24 hours: Temp:  [97.3 F (36.3 C)-98.5 F (36.9 C)] 97.9 F (36.6 C) (12/06 0400) Pulse Rate:  [76-101] 80 (12/05 1900) Resp:  [7-26] 21 (12/05 1900) BP: (93-155)/(73-94) 127/73 (12/05 1900) SpO2:  [86 %-97 %] 89 % (12/06 0743) FiO2 (%):  [70 %-80 %] 70 % (12/06 0743) Weight:  [60.2 kg (132 lb 11.5 oz)] 60.2 kg (132 lb 11.5 oz) (12/06 0500) Weight change: -3.1 kg (-6 lb 13.4 oz) Last BM Date: 04/10/16  Intake/Output from previous day: 12/05 0701 - 12/06 0700 In: 600 [IV Piggyback:600] Out: 1650 [Urine:1650]  PHYSICAL EXAM General appearance: alert, cooperative and no distress Resp: Decreased breath sounds with prolonged expiratory phase Cardio: regular rate and rhythm, S1, S2 normal, no murmur, click, rub or gallop GI: soft, non-tender; bowel sounds normal; no masses,  no organomegaly Extremities: extremities normal, atraumatic, no cyanosis or edema Skin warm and dry. Pupils react. Mucous membranes are moist.  Lab Results:  Results for orders placed or performed during the hospital encounter of 04/04/16 (from the past 48 hour(s))  Blood gas, arterial     Status: Abnormal   Collection Time: 04/09/16 10:30 AM  Result Value Ref Range   O2 Content 9.0 L/min   Delivery systems NASAL CANNULA    pH, Arterial 7.376 7.350 - 7.450   pCO2 arterial 66.7 (HH) 32.0 - 48.0 mmHg    Comment: CRITICAL RESULT CALLED TO, READ BACK BY AND VERIFIED WITH: BOB LEE RN,[id number] BY WENDY VIA,RRT,RCP ON 04/09/2016    pO2, Arterial 125 (H) 83.0 - 108.0 mmHg   Bicarbonate 34.4 (H) 20.0 - 28.0 mmol/L   Acid-Base Excess 12.6 (H) 0.0 - 2.0 mmol/L   O2 Saturation 97.1 %   Patient temperature 37.0    Collection site  BRACHIAL ARTERY    Drawn by [id number]    Sample type ARTERIAL DRAW    Allens test (pass/fail) PASS PASS  Basic metabolic panel     Status: Abnormal   Collection Time: 04/10/16  8:19 AM  Result Value Ref Range   Sodium 140 135 - 145 mmol/L   Potassium 4.4 3.5 - 5.1 mmol/L   Chloride 96 (L) 101 - 111 mmol/L   CO2 40 (H) 22 - 32 mmol/L   Glucose, Bld 147 (H) 65 - 99 mg/dL   BUN 16 6 - 20 mg/dL   Creatinine, Ser 0.30 (L) 0.61 - 1.24 mg/dL   Calcium 9.5 8.9 - 10.3 mg/dL   GFR calc non Af Amer >60 >60 mL/min   GFR calc Af Amer >60 >60 mL/min    Comment: (NOTE) The eGFR has been calculated using the CKD EPI equation. This calculation has not been validated in all clinical situations. eGFR's persistently <60 mL/min signify possible Chronic Kidney Disease.    Anion gap 4 (L) 5 - 15  CBC     Status: Abnormal   Collection Time: 04/10/16  8:19 AM  Result Value Ref Range   WBC 11.2 (H) 4.0 - 10.5 K/uL   RBC 4.34 4.22 - 5.81 MIL/uL   Hemoglobin 13.4 13.0 - 17.0 g/dL   HCT 42.6 39.0 - 52.0 %   MCV 98.2 78.0 - 100.0 fL   MCH 30.9 26.0 -  34.0 pg   MCHC 31.5 30.0 - 36.0 g/dL   RDW 12.6 11.5 - 15.5 %   Platelets 139 (L) 150 - 400 K/uL  Ammonia     Status: None   Collection Time: 04/10/16  8:46 AM  Result Value Ref Range   Ammonia 26 9 - 35 umol/L  Urinalysis, Routine w reflex microscopic (not at [facility])     Status: None   Collection Time: 04/10/16 10:50 AM  Result Value Ref Range   Color, Urine YELLOW YELLOW   APPearance CLEAR CLEAR   Specific Gravity, Urine 1.016 1.005 - 1.030   pH 7.0 5.0 - 8.0   Glucose, UA NEGATIVE NEGATIVE mg/dL   Hgb urine dipstick NEGATIVE NEGATIVE   Bilirubin Urine NEGATIVE NEGATIVE   Ketones, ur NEGATIVE NEGATIVE mg/dL   Protein, ur NEGATIVE NEGATIVE mg/dL   Nitrite NEGATIVE NEGATIVE   Leukocytes, UA NEGATIVE NEGATIVE  CBC     Status: Abnormal   Collection Time: 04/11/16  3:56 AM  Result Value Ref Range   WBC 10.3 4.0 - 10.5 K/uL   RBC 4.48 4.22 - 5.81  MIL/uL   Hemoglobin 14.0 13.0 - 17.0 g/dL   HCT 43.4 39.0 - 52.0 %   MCV 96.9 78.0 - 100.0 fL   MCH 31.3 26.0 - 34.0 pg   MCHC 32.3 30.0 - 36.0 g/dL   RDW 12.7 11.5 - 15.5 %   Platelets 144 (L) 150 - 400 K/uL  Basic metabolic panel     Status: Abnormal   Collection Time: 04/11/16  3:56 AM  Result Value Ref Range   Sodium 141 135 - 145 mmol/L   Potassium 4.2 3.5 - 5.1 mmol/L   Chloride 96 (L) 101 - 111 mmol/L   CO2 41 (H) 22 - 32 mmol/L   Glucose, Bld 158 (H) 65 - 99 mg/dL   BUN 17 6 - 20 mg/dL   Creatinine, Ser 0.36 (L) 0.61 - 1.24 mg/dL   Calcium 9.5 8.9 - 10.3 mg/dL   GFR calc non Af Amer >60 >60 mL/min   GFR calc Af Amer >60 >60 mL/min    Comment: (NOTE) The eGFR has been calculated using the CKD EPI equation. This calculation has not been validated in all clinical situations. eGFR's persistently <60 mL/min signify possible Chronic Kidney Disease.    Anion gap 4 (L) 5 - 15    ABGS  Recent Labs  04/09/16 1030  PHART 7.376  PO2ART 125*  HCO3 34.4*   CULTURES Recent Results (from the past 240 hour(s))  Blood culture (routine x 2)     Status: None (Preliminary result)   Collection Time: 04/04/16  8:32 AM  Result Value Ref Range Status   Specimen Description BLOOD LEFT HAND  Final   Special Requests   Final    BOTTLES DRAWN AEROBIC AND ANAEROBIC AEB=10CC ANA=8CC   Culture NO GROWTH 2 DAYS  Final   Report Status PENDING  Incomplete  Blood culture (routine x 2)     Status: None (Preliminary result)   Collection Time: 04/04/16  8:37 AM  Result Value Ref Range Status   Specimen Description BLOOD RIGHT HAND  Final   Special Requests BOTTLES DRAWN AEROBIC AND ANAEROBIC 10CC ANA=8CC  Final   Culture NO GROWTH 2 DAYS  Final   Report Status PENDING  Incomplete  Urine culture     Status: Abnormal   Collection Time: 04/04/16 11:08 AM  Result Value Ref Range Status   Specimen Description URINE, CLEAN CATCH  Final   Special Requests NONE  Final   Culture MULTIPLE SPECIES  PRESENT, SUGGEST RECOLLECTION (A)  Final   Report Status 04/06/2016 FINAL  Final  MRSA PCR Screening     Status: None   Collection Time: 04/04/16 12:15 PM  Result Value Ref Range Status   MRSA by PCR NEGATIVE NEGATIVE Final    Comment:        The GeneXpert MRSA Assay (FDA approved for NASAL specimens only), is one component of a comprehensive MRSA colonization surveillance program. It is not intended to diagnose MRSA infection nor to guide or monitor treatment for MRSA infections.   Culture, sputum-assessment     Status: None   Collection Time: 04/05/16 11:50 PM  Result Value Ref Range Status   Specimen Description SPUTUM  Final   Special Requests NONE  Final   Sputum evaluation   Final    THIS SPECIMEN IS ACCEPTABLE. RESPIRATORY CULTURE REPORT TO FOLLOW.   Report Status 04/06/2016 FINAL  Final  Culture, respiratory (NON-Expectorated)     Status: None   Collection Time: 04/05/16 11:50 PM  Result Value Ref Range Status   Specimen Description SPUTUM  Final   Special Requests NONE  Final   Gram Stain   Final    FEW WBC PRESENT, PREDOMINANTLY PMN RARE GRAM POSITIVE COCCI IN PAIRS AND CHAINS RARE GRAM NEGATIVE COCCOBACILLI RARE GRAM POSITIVE RODS    Culture   Final    Consistent with normal respiratory flora. Performed at [facility]    Report Status 04/08/2016 FINAL  Final   Studies/Results: Dg Chest Port 1 View  Result Date: 04/11/2016 CLINICAL DATA:  Difficulty breathing EXAM: PORTABLE CHEST 1 VIEW COMPARISON:  04/08/2016 FINDINGS: Cardiac shadow is obscured by opacification in the left hemi thorax. There is complete opacification of the left hemi thorax noted with significant volume loss and midline shift to the left. The right lung is hyperaerated without focal infiltrate. Some mild interstitial thickening is noted. IMPRESSION: Complete opacification of the left hemi thorax with volume loss likely related to mucous plugging and consolidation. Hyper expansion of  the right lung is noted. Electronically Signed   By: Inez Catalina M.D.   On: 04/11/2016 07:41    Medications:  Prior to Admission:  Prescriptions Prior to Admission  Medication Sig Dispense Refill Last Dose  . albuterol (PROVENTIL HFA;VENTOLIN HFA) 108 (90 Base) MCG/ACT inhaler Inhale 2 puffs into the lungs every 4 (four) hours as needed for wheezing or shortness of breath.   04/04/2016 at Unknown time  . albuterol (PROVENTIL) (2.5 MG/3ML) 0.083% nebulizer solution Take 3 mLs (2.5 mg total) by nebulization every 4 (four) hours as needed for wheezing or shortness of breath. 75 mL 12 04/04/2016 at Unknown time  . aspirin EC 81 MG tablet Take 81 mg by mouth daily.    04/03/2016 at Unknown time  . atorvastatin (LIPITOR) 80 MG tablet Take 1 tablet (80 mg total) by mouth daily. 90 tablet 3 04/03/2016 at Unknown time  . benazepril (LOTENSIN) 5 MG tablet Take 1 tablet (5 mg total) by mouth daily. 90 tablet 0 04/03/2016 at Unknown time  . budesonide-formoterol (SYMBICORT) 160-4.5 MCG/ACT inhaler Inhale 2 puffs into the lungs 2 (two) times daily. 3 Inhaler 3 04/03/2016 at Unknown time  . clonazePAM (KLONOPIN) 0.5 MG tablet TAKE 1 TABLET BY MOUTH TWICE DAILY AS NEEDED FOR ANXIETY 60 tablet 5 04/03/2016 at Unknown time  . finasteride (PROSCAR) 5 MG tablet Take 5 mg by mouth daily.  11 04/03/2016 at Unknown time  . metoprolol succinate (TOPROL-XL) 25 MG 24 hr tablet TAKE 1/2 TABLET BY MOUTH 2 TIMES DAILY. 90 tablet 3 04/03/2016 at 2000  . potassium chloride SA (K-DUR,KLOR-CON) 20 MEQ tablet Take 1 tablet (20 mEq total) by mouth daily. 90 tablet 3 04/03/2016 at Unknown time  . tiotropium (SPIRIVA) 18 MCG inhalation capsule Place 18 mcg into inhaler and inhale daily.   04/03/2016 at Unknown time  . torsemide (DEMADEX) 20 MG tablet Take 2 tablets (40 mg total) by mouth daily. 180 tablet 1 04/03/2016 at Unknown time  . HYDROcodone-acetaminophen (NORCO) 10-325 MG tablet Take 0.5 tablets by mouth every 6 (six) hours  as needed for severe pain. 60 tablet 0 unknown  . nitroGLYCERIN (NITROSTAT) 0.4 MG SL tablet Place 1 tablet (0.4 mg total) under the tongue every 5 (five) minutes as needed for chest pain. 100 tablet 0 unknown   Scheduled: . aspirin EC  81 mg Oral Daily  . atorvastatin  80 mg Oral Daily  . azithromycin  500 mg Intravenous Q24H  . cefTRIAXone (ROCEPHIN)  IV  1 g Intravenous Q24H  . chlorhexidine  15 mL Mouth Rinse BID  . enoxaparin (LOVENOX) injection  40 mg Subcutaneous Q24H  . feeding supplement (ENSURE ENLIVE)  237 mL Oral TID BM  . finasteride  5 mg Oral Daily  . guaiFENesin  1,200 mg Oral BID  . ipratropium-albuterol  3 mL Nebulization Q6H  . losartan  12.5 mg Oral Daily  . mouth rinse  15 mL Mouth Rinse q12n4p  . methylPREDNISolone (SOLU-MEDROL) injection  60 mg Intravenous Q6H  . metoprolol succinate  12.5 mg Oral BID  . mometasone-formoterol  2 puff Inhalation BID  . nicotine  21 mg Transdermal Daily   Continuous:  WEX:[id number] **OR** acetaminophen, albuterol, HYDROcodone-acetaminophen, LORazepam, ondansetron **OR** ondansetron (ZOFRAN) IV  Assesment: He was admitted with COPD exacerbation, community-acquired pneumonia, acute on chronic hypoxic respiratory failure and sepsis. He had significant acute encephalopathy. He is substantially better with all of these. Sepsis has resolved. His encephalopathy is much better. He is on high flow nasal cannula and not requiring BiPAP at this point. Principal Problem:   Sepsis ([place]) Active Problems:   Essential hypertension   CAD (coronary artery disease)   Anxiety   COPD with acute exacerbation (HCC)   Chronic pain syndrome   Acute on chronic respiratory failure with hypoxia (HCC)   Chronic systolic CHF (congestive heart failure) (HCC)   Hypotension   CAP (community acquired pneumonia)   Anemia   Thrombocytopenia (HCC)   Fever   Elevated troponin   Acute encephalopathy   Palliative care encounter   Goals of care,  counseling/discussion   DNR (do not resuscitate) discussion    Plan: Add flutter valve. Continue other treatments.    LOS: 7 days   [place] L 04/11/2016, 7:53 AM

## 2016-04-11 NOTE — Care Management Note (Signed)
Case Management Note  Patient Details  Name: Tommy Turner MRN: [id number] Date of Birth: [birth year]  Expected Discharge Date:    04/14/2016              Expected Discharge Plan:  [place]  In-House Referral:  NA  Discharge planning Services  CM Consult  Post Acute Care Choice:  Durable Medical Equipment, Home Health Choice offered to:  Patient  DME Arranged:  Wheelchair manual DME Agency:  [place]:  RN, PT [facility] Agency:  [place]  Status of Service:  In process, will continue to follow  Additional Comments: Pt still requiring HFNC. Pt will need to wean back to <6lpm prior to DC or need concentrator at home changed to provide HF. PT has recommended HH PT at DC family agrees and pt/family request [facility] for [facility] services. Per pt's wife he does not have WC and would benefit from one to get to appointments. She would also like to use [facility] for WC. Romualdo Bolk, of [facility] made aware of referral and will obtain pt info from chart. CM will cont to follow for DC planning.   Sherald Barge, RN 04/11/2016, 3:39 PM

## 2016-04-12 ENCOUNTER — Inpatient Hospital Stay (HOSPITAL_COMMUNITY): Payer: 59

## 2016-04-12 MED ORDER — TORSEMIDE 20 MG PO TABS
40.0000 mg | ORAL_TABLET | Freq: Every day | ORAL | Status: DC
  Administered 2016-04-12 – 2016-04-17 (×6): 40 mg via ORAL
  Filled 2016-04-12 (×6): qty 2

## 2016-04-12 MED ORDER — DEXTROSE 5 % IV SOLN
1.0000 g | INTRAVENOUS | Status: DC
  Administered 2016-04-12 – 2016-04-16 (×5): 1 g via INTRAVENOUS
  Filled 2016-04-12 (×7): qty 10

## 2016-04-12 MED ORDER — ACETYLCYSTEINE 20 % IN SOLN
3.0000 mL | RESPIRATORY_TRACT | Status: DC
  Administered 2016-04-12 (×2): 3 mL via RESPIRATORY_TRACT
  Administered 2016-04-13: 4 mL via RESPIRATORY_TRACT
  Administered 2016-04-13 (×5): 3 mL via RESPIRATORY_TRACT
  Administered 2016-04-14: 07:00:00 via RESPIRATORY_TRACT
  Administered 2016-04-14 (×2): 3 mL via RESPIRATORY_TRACT
  Administered 2016-04-14: 15:00:00 via RESPIRATORY_TRACT
  Administered 2016-04-14 – 2016-04-15 (×5): 3 mL via RESPIRATORY_TRACT
  Filled 2016-04-12 (×17): qty 4

## 2016-04-12 MED ORDER — AZITHROMYCIN 250 MG PO TABS
500.0000 mg | ORAL_TABLET | Freq: Every day | ORAL | Status: DC
  Administered 2016-04-12 – 2016-04-17 (×6): 500 mg via ORAL
  Filled 2016-04-12 (×6): qty 2

## 2016-04-12 MED ORDER — IPRATROPIUM-ALBUTEROL 0.5-2.5 (3) MG/3ML IN SOLN
3.0000 mL | RESPIRATORY_TRACT | Status: DC
  Administered 2016-04-12 – 2016-04-15 (×19): 3 mL via RESPIRATORY_TRACT
  Filled 2016-04-12 (×19): qty 3

## 2016-04-12 NOTE — Progress Notes (Signed)
Daily Progress Note   Patient Name: Tommy Turner       Date: 04/12/2016 DOB: [birth year]  Age: 75 y.o. MRN#: [id number] Attending Physician: Rosita Fire, MD Primary Care Physician: Sallee Lange, MD Admit Date: 04/04/2016  Reason for Consultation/Follow-up: Establishing goals of care and Psychosocial/spiritual support  Subjective: Tommy Turner is sitting up in bed eating lunch. He greets me and makes, but does not keep, eye contact. He is able to feed himself. He denies issues or complaints at this time. He will only briefly make eye contact and then look away. I question if there is some continued confusion, but this may also be related to hearing loss.   I ask if he has thought about where he will go when he leaves the hospital, and his wife, [name], quickly states that they have spoken with advanced home health for in-home services. [name] states that his 2 grand nieces, ages 65 and 61, are ready for him to come home. Tommy Turner does not engage in conversation today. No symptom management services needed at this time. Goals of care have been set with wife, but conversation with patient would be very beneficial.  PMT will continue to follow.  Length of Stay: 8  Current Medications: Scheduled Meds:  . acetylcysteine  3 mL Nebulization Q4H  . aspirin EC  81 mg Oral Daily  . atorvastatin  80 mg Oral Daily  . azithromycin  500 mg Oral Daily  . cefTRIAXone (ROCEPHIN)  IV  1 g Intravenous Q24H  . chlorhexidine  15 mL Mouth Rinse BID  . enoxaparin (LOVENOX) injection  40 mg Subcutaneous Q24H  . feeding supplement (ENSURE ENLIVE)  237 mL Oral TID BM  . finasteride  5 mg Oral Daily  . guaiFENesin  1,200 mg Oral BID  . ipratropium-albuterol  3 mL Nebulization Q4H  . losartan  12.5 mg Oral  Daily  . mouth rinse  15 mL Mouth Rinse q12n4p  . methylPREDNISolone (SOLU-MEDROL) injection  60 mg Intravenous Q12H  . metoprolol succinate  12.5 mg Oral BID  . mometasone-formoterol  2 puff Inhalation BID  . nicotine  21 mg Transdermal Daily  . torsemide  40 mg Oral Daily    Continuous Infusions:   PRN Meds: acetaminophen **OR** acetaminophen, albuterol, HYDROcodone-acetaminophen, LORazepam, ondansetron **OR** ondansetron (ZOFRAN) IV  Physical Exam  Constitutional: No distress.  Frail, chronically ill appearing  HENT:  Head: Normocephalic and atraumatic.  Temporal wasting  Cardiovascular: Normal rate.   Pulmonary/Chest: Effort normal.  High flo Nasal cannula  Abdominal: Soft. He exhibits no distension. There is no guarding.  Musculoskeletal:  Thin and frail, muscle wasting  Neurological: He is alert.  Nursing note and vitals reviewed.           Vital Signs: BP (!) 109/59   Pulse 70   Temp 98.8 F (37.1 C) (Oral)   Resp 19   Ht 5\' 7"  (1.702 m)   Wt 60.2 kg (132 lb 11.5 oz)   SpO2 94%   BMI 20.79 kg/m  SpO2: SpO2: 94 % O2 Device: O2 Device: High Flow Nasal Cannula O2 Flow Rate: O2 Flow Rate (L/min): 30 L/min  Intake/output summary:  Intake/Output Summary (Last 24 hours) at 04/12/16 1520 Last data filed at 04/12/16 1238  Gross per 24 hour  Intake              770 ml  Output              525 ml  Net              245 ml   LBM: Last BM Date: 04/12/16 Baseline Weight: Weight: 61.7 kg (136 lb) Most recent weight: Weight: 60.2 kg (132 lb 11.5 oz)       Palliative Assessment/Data:    Flowsheet Rows   Flowsheet Row Most Recent Value  Intake Tab  Referral Department  Hospitalist  Unit at Time of Referral  ICU  Palliative Care Primary Diagnosis  Cardiac  Date Notified  04/09/16  Palliative Care Type  New Palliative care  Reason for referral  Clarify Goals of Care  Date of Admission  04/04/16  Date first seen by Palliative Care  04/09/16  # of days  Palliative referral response time  0 Day(s)  # of days IP prior to Palliative referral  5  Clinical Assessment  Palliative Performance Scale Score  20%  Pain Max last 24 hours  Not able to report  Pain Min Last 24 hours  Not able to report  Dyspnea Max Last 24 Hours  Not able to report  Dyspnea Min Last 24 hours  Not able to report  Psychosocial & Spiritual Assessment  Palliative Care Outcomes  Patient/Family meeting held?  Yes  Who was at the meeting?  brief meeting today with wife at bedside. family meeting 12/5 at 1000  Palliative Care follow-up planned  -- [follow up while at [place]      Patient Active Problem List   Diagnosis Date Noted  . Palliative care encounter   . Goals of care, counseling/discussion   . DNR (do not resuscitate) discussion   . Acute encephalopathy 04/07/2016  . Anemia 04/05/2016  . Thrombocytopenia ([place]) 04/05/2016  . Fever 04/05/2016  . Elevated troponin 04/05/2016  . CAP (community acquired pneumonia) 04/04/2016  . Sepsis ([place]) 04/04/2016  . BPH (benign prostatic hypertrophy) with urinary retention 07/01/2015  . Hypotension 05/24/2015  . Systolic CHF, chronic ([place]) 05/24/2015  . Protein-calorie malnutrition, severe 05/04/2015  . Acute on chronic respiratory failure with hypoxia ([place]) 05/03/2015  . Chronic systolic CHF (congestive heart failure) ([place]) 05/03/2015  . Leg swelling- Bilateral leg / foot 10/21/2014  . Abdominal aortic aneurysm without rupture ([place]) 09/14/2014  . Dyspnea 06/03/2014  . Elevated PSA 03/09/2014  . Peripheral vascular disease, unspecified 01/22/2014  . Chronic pain  syndrome 10/27/2013  . Hyperglycemia 04/09/2013  . Acute respiratory failure with hypoxia ([place]) 04/09/2013  . COPD with acute exacerbation ([place]) 04/05/2013  . COPD exacerbation ([place]) 04/05/2013  . Loss of weight 03/18/2013  . Early satiety 03/18/2013  . Dysphagia, unspecified(787.20) 03/18/2013  . COPD (chronic obstructive pulmonary disease) ([place]) 10/31/2012    . Hyperlipemia 10/31/2012  . CAD (coronary artery disease) 10/31/2012  . Anxiety 10/31/2012  . Abdominal aortic aneurysm ([place]) 02/02/2011  . TOBACCO ABUSE 09/20/2009  . PEPTIC ULCER DISEASE 09/20/2009  . Essential hypertension 09/19/2009  . BRONCHITIS, CHRONIC 09/19/2009    Palliative Care Assessment & Plan   Patient Profile: 75 y.o. male  with past medical history of COPD with home O2 dependence, anxiety and depression, CAD, CHF, AAA with stenting 2 1/2 years ago, hypertension and increase cholesterol, MI 20 years ago, gastric ulcer requiring surgery 25 years ago admitted on 04/04/2016 with sepsis related to community acquired pneumonia.   Assessment: As above  Recommendations/Plan:  Continue to treat the treatable, but no extraordinary measures such as CPR or intubation. Continue to offer SNF for rehab as needed. Continue to discuss options for hospice in the future.  Goals of Care and Additional Recommendations:  Limitations on Scope of Treatment: Continue to treat the treatable but no extraordinary measures such as CPR or intubation  Code Status:    Code Status Orders        Start     Ordered   04/10/16 1213  Do not attempt resuscitation (DNR)  Continuous    Question Answer Comment  In the event of cardiac or respiratory ARREST Do not call a "code blue"   In the event of cardiac or respiratory ARREST Do not perform Intubation, CPR, defibrillation or ACLS   In the event of cardiac or respiratory ARREST Use medication by any route, position, wound care, and other measures to relive pain and suffering. May use oxygen, suction and manual treatment of airway obstruction as needed for comfort.      04/10/16 1212    Code Status History    Date Active Date Inactive Code Status Order ID Comments User Context   04/04/2016  3:42 PM 04/04/2016  3:42 PM Full Code [id number]  Kathie Dike, MD Inpatient   04/04/2016  3:42 PM 04/10/2016 12:12 PM Full Code [id number]  Kathie Dike,  MD Inpatient   11/24/2015  2:16 PM 11/25/2015  1:48 PM Full Code [id number]  Irine Seal, MD Inpatient   05/02/2015  2:17 PM 05/10/2015  7:38 PM Full Code [id number]  Orvan Falconer, MD Inpatient   09/14/2014  2:27 PM 09/15/2014  6:37 PM Full Code [id number]  Alvia Grove, PA-C Inpatient   04/05/2013  4:33 PM 04/12/2013 10:13 PM Full Code [id number]  Donne Hazel, MD ED       Prognosis:   < 6 months, would not be surprising based on frailty, functional decline, severity of cardiovascular/pulmonary compromise  Discharge Planning:  Wife states they will return home with the services of advanced home health  Care plan was discussed with nursing staff, case manager, social worker, and Dr. Carolin Sicks on next rounds  Thank you for allowing the Palliative Medicine Team to assist in the care of this patient.   Time In: 1130  Time Out: 1145  Total Time 15 minutes  Prolonged Time Billed  no       Greater than 50%  of this time was spent counseling and coordinating care related to the above assessment and plan.  Drue Novel, NP  Please contact Palliative Medicine Team phone at [phone] for questions and concerns.

## 2016-04-12 NOTE — Plan of Care (Signed)
Problem: Health Behavior/Discharge Planning: Goal: Ability to manage health-related needs will improve Outcome: Progressing Patient is progressing well and is trying to be more independent.

## 2016-04-12 NOTE — Progress Notes (Signed)
Patient Name: Tommy Turner Date of Encounter: 04/12/2016  Primary Cardiologist: Carlyle Dolly MD  Hospital Problem List     Principal Problem:   Sepsis [facility]) Active Problems:   Essential hypertension   CAD (coronary artery disease)   Anxiety   COPD with acute exacerbation (HCC)   Chronic pain syndrome   Acute on chronic respiratory failure with hypoxia (HCC)   Chronic systolic CHF (congestive heart failure) (HCC)   Hypotension   CAP (community acquired pneumonia)   Anemia   Thrombocytopenia (HCC)   Fever   Elevated troponin   Acute encephalopathy   Palliative care encounter   Goals of care, counseling/discussion   DNR (do not resuscitate) discussion     Subjective   No complaints. Breathing some better but feels weak.   Inpatient Medications    Scheduled Meds: . aspirin EC  81 mg Oral Daily  . atorvastatin  80 mg Oral Daily  . chlorhexidine  15 mL Mouth Rinse BID  . enoxaparin (LOVENOX) injection  40 mg Subcutaneous Q24H  . feeding supplement (ENSURE ENLIVE)  237 mL Oral TID BM  . finasteride  5 mg Oral Daily  . guaiFENesin  1,200 mg Oral BID  . ipratropium-albuterol  3 mL Nebulization Q6H  . losartan  12.5 mg Oral Daily  . mouth rinse  15 mL Mouth Rinse q12n4p  . methylPREDNISolone (SOLU-MEDROL) injection  60 mg Intravenous Q12H  . metoprolol succinate  12.5 mg Oral BID  . mometasone-formoterol  2 puff Inhalation BID  . nicotine  21 mg Transdermal Daily   Continuous Infusions:  PRN Meds: acetaminophen **OR** acetaminophen, albuterol, HYDROcodone-acetaminophen, LORazepam, ondansetron **OR** ondansetron (ZOFRAN) IV   Vital Signs    Vitals:   04/12/16 0300 04/12/16 0400 04/12/16 0500 04/12/16 0600  BP: 109/64 119/65 112/68 113/67  Pulse: 66 69 65 (!) 52  Resp: (!) 23 (!) 22 20 18   Temp:  98.1 F (36.7 C)    TempSrc:  Oral    SpO2: 94% 96% 96% 96%  Weight:   132 lb 11.5 oz (60.2 kg)   Height:        Intake/Output Summary (Last 24 hours) at  04/12/16 0741 Last data filed at 04/11/16 2200  Gross per 24 hour  Intake             1010 ml  Output             1150 ml  Net             -140 ml   Filed Weights   04/10/16 0500 04/11/16 0500 04/12/16 0500  Weight: 139 lb 8.8 oz (63.3 kg) 132 lb 11.5 oz (60.2 kg) 132 lb 11.5 oz (60.2 kg)    Physical Exam    GEN: Cachetic no acute distress HEENT: Grossly normal.  Neck: Supple, no JVD, carotid bruits, or masses. Cardiac: RRR, tachycardic, no murmurs, rubs, or gallops. No clubbing, cyanosis, edema.  Radials/DP/PT 1+ and equal bilaterally.  Respiratory:  Respirations regular and unlabored, expiratory wheezes on the right, diminished on the left. No coughing.  GI: Soft, nontender, nondistended, BS + x 4. MS: no deformity or atrophy. Skin: warm and dry, no rash. Neuro:  Strength diminished and sensation are intact. Psych: AAOx3.  Normal affect.  Labs    CBC  Recent Labs  04/10/16 0819 04/11/16 0356  WBC 11.2* 10.3  HGB 13.4 14.0  HCT 42.6 43.4  MCV 98.2 96.9  PLT 139* [id number]*   Basic Metabolic Panel  Recent Labs  04/10/16 0819 04/11/16 0356  NA 140 141  K 4.4 4.2  CL 96* 96*  CO2 40* 41*  GLUCOSE 147* 158*  BUN 16 17  CREATININE 0.30* 0.36*  CALCIUM 9.5 9.5     Telemetry    SR with frequent PVC. No recurrent SVT- Personally Reviewed  Radiology    Dg Chest Port 1 View  Result Date: 04/11/2016 CLINICAL DATA:  Difficulty breathing EXAM: PORTABLE CHEST 1 VIEW COMPARISON:  04/08/2016 FINDINGS: Cardiac shadow is obscured by opacification in the left hemi thorax. There is complete opacification of the left hemi thorax noted with significant volume loss and midline shift to the left. The right lung is hyperaerated without focal infiltrate. Some mild interstitial thickening is noted. IMPRESSION: Complete opacification of the left hemi thorax with volume loss likely related to mucous plugging and consolidation. Hyper expansion of the right lung is noted. Electronically  Signed   By: Inez Catalina M.D.   On: 04/11/2016 07:41    Cardiac Studies   Echocardiogram 04/09/2016: Study Conclusions  - Left ventricle: The cavity size was mildly dilated. Wall thickness was increased in a pattern of mild LVH. Systolic function was severely reduced. The estimated ejection fraction was 20%. There is akinesis of the inferior and inferoseptal myocardium. There is dyskinesis of the mid-apicalanteroseptal and apical myocardium. The study is not technically sufficient to allow evaluation of LV diastolic function. - Aortic valve: Mildly calcified annulus. Trileaflet; mildly calcified leaflets. - Aortic root: The aortic root was mildly dilated. - Mitral valve: Calcified annulus. Mildly thickened leaflets . There was mild regurgitation. - Left atrium: The atrium was severely dilated. - Right ventricle: Systolic function was moderately reduced. - Right atrium: The atrium was mildly dilated. Central venous pressure (est): 3 mm Hg. - Atrial septum: No defect or patent foramen ovale was identified. - Tricuspid valve: There was trivial regurgitation. - Pulmonary arteries: Systolic pressure could not be accurately estimated. - Pericardium, extracardiac: There was no pericardial effusion.  Impressions:  - Mild LVH with mild chamber dilatation and LVEF approximately 20%. Wall motion abnormalities consistent with ischemic cardiomyopathy. Indeterminate diastolic function. Severe left atrial enlargement. Mitral annular calcification with mild mitral regurgitation. Mildly dilated aortic root. Moderately reduced right ventricular contraction. Trivial tricuspid regurgitation. Mild right atrial enlargement.  Patient Profile     75 year old male with multivessel CAD status post previous CABG, chronic systolic heart failure with ischemic cardiomyopathy (declined ICD), RV dysfunction, and severe COPD with acute on chronic combined respiratory  failure and sepsis in the setting of community-acquired pneumonia. He had a mild troponin I increase most consistent with demand ischemia. Follow-up echocardiogram shows stable LVEF approximately 20%. He is being managed medically.  Assessment & Plan    1. Hx of Ischemic Cardiomyopathy: Echo this admission reveals EF 20%. He refuses ICD. On metoprolol 12.5 mg BID and losartan. Creatinine 0.36. He remains stable with some tachycardia at times. This may be related to steroids and inhalers. No further NSVT.   2.  Sepsis CAP etiology with COPD:  Being followed by Hospitalist team. He continues on antibiotic therapy, steroids, and pulmonary toilet with flutter valve. Chest PT per ICU bed via respiratory therapy. CXR revealed mucus plugging and consolidation on the left.   3. Demand ischemia: In the setting of CAP, and sepsis.   4. Hypotension: Stable now back on BB and ARB. Soft.   Signed, Jory Sims, NP  04/12/2016, 7:41 AM   Attending Note Patient seen and discussed with NP  Lawrence, I agree with her documentation above. History of CAD with prior CABG, chronic systolic HF LVEF [id number] admitted with sepsis due to CAP. His chronic cardiac conditions are stable. Diuretic held in setting of sepsis and prior hypotension now resolved, we will restart torsemide 40mg  daily. We will sign off inpatient care, please contact our office at d/c to arrange f/u in 1-2 weeks.    Zandra Abts MD

## 2016-04-12 NOTE — Progress Notes (Addendum)
Subjective: He says he had a good night last night. He is off BiPAP and did not use that at all last night. He says his breathing is better. He is coughing a little bit.  Objective: Vital signs in last 24 hours: Temp:  [97.8 F (36.6 C)-98.5 F (36.9 C)] 98.1 F (36.7 C) (12/07 0400) Pulse Rate:  [52-88] 52 (12/07 0600) Resp:  [17-25] 18 (12/07 0600) BP: (109-140)/(59-91) 113/67 (12/07 0600) SpO2:  [84 %-96 %] 96 % (12/07 0600) FiO2 (%):  [70 %] 70 % (12/07 0130) Weight:  [60.2 kg (132 lb 11.5 oz)] 60.2 kg (132 lb 11.5 oz) (12/07 0500) Weight change: 0 kg (0 lb) Last BM Date: 04/11/16  Intake/Output from previous day: 12/06 0701 - 12/07 0700 In: 1010 [P.O.:960; IV Piggyback:50] Out: 1150 [Urine:1150]  PHYSICAL EXAM General appearance: alert, cooperative and mild distress Resp: rhonchi bilaterally Cardio: regular rate and rhythm, S1, S2 normal, no murmur, click, rub or gallop GI: soft, non-tender; bowel sounds normal; no masses,  no organomegaly Extremities: extremities normal, atraumatic, no cyanosis or edema Mucous membranes are moist. Pupils reactive. Skin warm and dry  Lab Results:  Results for orders placed or performed during the hospital encounter of 04/04/16 (from the past 48 hour(s))  Basic metabolic panel     Status: Abnormal   Collection Time: 04/10/16  8:19 AM  Result Value Ref Range   Sodium 140 135 - 145 mmol/L   Potassium 4.4 3.5 - 5.1 mmol/L   Chloride 96 (L) 101 - 111 mmol/L   CO2 40 (H) 22 - 32 mmol/L   Glucose, Bld 147 (H) 65 - 99 mg/dL   BUN 16 6 - 20 mg/dL   Creatinine, Ser 0.30 (L) 0.61 - 1.24 mg/dL   Calcium 9.5 8.9 - 10.3 mg/dL   GFR calc non Af Amer >60 >60 mL/min   GFR calc Af Amer >60 >60 mL/min    Comment: (NOTE) The eGFR has been calculated using the CKD EPI equation. This calculation has not been validated in all clinical situations. eGFR's persistently <60 mL/min signify possible Chronic Kidney Disease.    Anion gap 4 (L) 5 - 15  CBC      Status: Abnormal   Collection Time: 04/10/16  8:19 AM  Result Value Ref Range   WBC 11.2 (H) 4.0 - 10.5 K/uL   RBC 4.34 4.22 - 5.81 MIL/uL   Hemoglobin 13.4 13.0 - 17.0 g/dL   HCT 42.6 39.0 - 52.0 %   MCV 98.2 78.0 - 100.0 fL   MCH 30.9 26.0 - 34.0 pg   MCHC 31.5 30.0 - 36.0 g/dL   RDW 12.6 11.5 - 15.5 %   Platelets 139 (L) 150 - 400 K/uL  Ammonia     Status: None   Collection Time: 04/10/16  8:46 AM  Result Value Ref Range   Ammonia 26 9 - 35 umol/L  Urinalysis, Routine w reflex microscopic (not at [facility])     Status: None   Collection Time: 04/10/16 10:50 AM  Result Value Ref Range   Color, Urine YELLOW YELLOW   APPearance CLEAR CLEAR   Specific Gravity, Urine 1.016 1.005 - 1.030   pH 7.0 5.0 - 8.0   Glucose, UA NEGATIVE NEGATIVE mg/dL   Hgb urine dipstick NEGATIVE NEGATIVE   Bilirubin Urine NEGATIVE NEGATIVE   Ketones, ur NEGATIVE NEGATIVE mg/dL   Protein, ur NEGATIVE NEGATIVE mg/dL   Nitrite NEGATIVE NEGATIVE   Leukocytes, UA NEGATIVE NEGATIVE  CBC  Status: Abnormal   Collection Time: 04/11/16  3:56 AM  Result Value Ref Range   WBC 10.3 4.0 - 10.5 K/uL   RBC 4.48 4.22 - 5.81 MIL/uL   Hemoglobin 14.0 13.0 - 17.0 g/dL   HCT 43.4 39.0 - 52.0 %   MCV 96.9 78.0 - 100.0 fL   MCH 31.3 26.0 - 34.0 pg   MCHC 32.3 30.0 - 36.0 g/dL   RDW 12.7 11.5 - 15.5 %   Platelets 144 (L) 150 - 400 K/uL  Basic metabolic panel     Status: Abnormal   Collection Time: 04/11/16  3:56 AM  Result Value Ref Range   Sodium 141 135 - 145 mmol/L   Potassium 4.2 3.5 - 5.1 mmol/L   Chloride 96 (L) 101 - 111 mmol/L   CO2 41 (H) 22 - 32 mmol/L   Glucose, Bld 158 (H) 65 - 99 mg/dL   BUN 17 6 - 20 mg/dL   Creatinine, Ser 0.36 (L) 0.61 - 1.24 mg/dL   Calcium 9.5 8.9 - 10.3 mg/dL   GFR calc non Af Amer >60 >60 mL/min   GFR calc Af Amer >60 >60 mL/min    Comment: (NOTE) The eGFR has been calculated using the CKD EPI equation. This calculation has not been validated in all clinical  situations. eGFR's persistently <60 mL/min signify possible Chronic Kidney Disease.    Anion gap 4 (L) 5 - 15  Glucose, capillary     Status: Abnormal   Collection Time: 04/11/16  9:10 PM  Result Value Ref Range   Glucose-Capillary 163 (H) 65 - 99 mg/dL    ABGS  Recent Labs  04/09/16 1030  PHART 7.376  PO2ART 125*  HCO3 34.4*   CULTURES Recent Results (from the past 240 hour(s))  Blood culture (routine x 2)     Status: None   Collection Time: 04/04/16  8:32 AM  Result Value Ref Range Status   Specimen Description BLOOD LEFT HAND  Final   Special Requests   Final    BOTTLES DRAWN AEROBIC AND ANAEROBIC AEB=10CC ANA=8CC   Culture NO GROWTH 7 DAYS  Final   Report Status 04/11/2016 FINAL  Final  Blood culture (routine x 2)     Status: None   Collection Time: 04/04/16  8:37 AM  Result Value Ref Range Status   Specimen Description BLOOD RIGHT HAND  Final   Special Requests BOTTLES DRAWN AEROBIC AND ANAEROBIC 10CC ANA=8CC  Final   Culture NO GROWTH 7 DAYS  Final   Report Status 04/11/2016 FINAL  Final  Urine culture     Status: Abnormal   Collection Time: 04/04/16 11:08 AM  Result Value Ref Range Status   Specimen Description URINE, CLEAN CATCH  Final   Special Requests NONE  Final   Culture MULTIPLE SPECIES PRESENT, SUGGEST RECOLLECTION (A)  Final   Report Status 04/06/2016 FINAL  Final  MRSA PCR Screening     Status: None   Collection Time: 04/04/16 12:15 PM  Result Value Ref Range Status   MRSA by PCR NEGATIVE NEGATIVE Final    Comment:        The GeneXpert MRSA Assay (FDA approved for NASAL specimens only), is one component of a comprehensive MRSA colonization surveillance program. It is not intended to diagnose MRSA infection nor to guide or monitor treatment for MRSA infections.   Culture, sputum-assessment     Status: None   Collection Time: 04/05/16 11:50 PM  Result Value Ref Range Status  Specimen Description SPUTUM  Final   Special Requests NONE   Final   Sputum evaluation   Final    THIS SPECIMEN IS ACCEPTABLE. RESPIRATORY CULTURE REPORT TO FOLLOW.   Report Status 04/06/2016 FINAL  Final  Culture, respiratory (NON-Expectorated)     Status: None   Collection Time: 04/05/16 11:50 PM  Result Value Ref Range Status   Specimen Description SPUTUM  Final   Special Requests NONE  Final   Gram Stain   Final    FEW WBC PRESENT, PREDOMINANTLY PMN RARE GRAM POSITIVE COCCI IN PAIRS AND CHAINS RARE GRAM NEGATIVE COCCOBACILLI RARE GRAM POSITIVE RODS    Culture   Final    Consistent with normal respiratory flora. Performed at [facility]    Report Status 04/08/2016 FINAL  Final   Studies/Results: Dg Chest Port 1 View  Result Date: 04/11/2016 CLINICAL DATA:  Difficulty breathing EXAM: PORTABLE CHEST 1 VIEW COMPARISON:  04/08/2016 FINDINGS: Cardiac shadow is obscured by opacification in the left hemi thorax. There is complete opacification of the left hemi thorax noted with significant volume loss and midline shift to the left. The right lung is hyperaerated without focal infiltrate. Some mild interstitial thickening is noted. IMPRESSION: Complete opacification of the left hemi thorax with volume loss likely related to mucous plugging and consolidation. Hyper expansion of the right lung is noted. Electronically Signed   By: Inez Catalina M.D.   On: 04/11/2016 07:41    Medications:  Prior to Admission:  Prescriptions Prior to Admission  Medication Sig Dispense Refill Last Dose  . albuterol (PROVENTIL HFA;VENTOLIN HFA) 108 (90 Base) MCG/ACT inhaler Inhale 2 puffs into the lungs every 4 (four) hours as needed for wheezing or shortness of breath.   04/04/2016 at Unknown time  . albuterol (PROVENTIL) (2.5 MG/3ML) 0.083% nebulizer solution Take 3 mLs (2.5 mg total) by nebulization every 4 (four) hours as needed for wheezing or shortness of breath. 75 mL 12 04/04/2016 at Unknown time  . aspirin EC 81 MG tablet Take 81 mg by mouth daily.     04/03/2016 at Unknown time  . atorvastatin (LIPITOR) 80 MG tablet Take 1 tablet (80 mg total) by mouth daily. 90 tablet 3 04/03/2016 at Unknown time  . benazepril (LOTENSIN) 5 MG tablet Take 1 tablet (5 mg total) by mouth daily. 90 tablet 0 04/03/2016 at Unknown time  . budesonide-formoterol (SYMBICORT) 160-4.5 MCG/ACT inhaler Inhale 2 puffs into the lungs 2 (two) times daily. 3 Inhaler 3 04/03/2016 at Unknown time  . clonazePAM (KLONOPIN) 0.5 MG tablet TAKE 1 TABLET BY MOUTH TWICE DAILY AS NEEDED FOR ANXIETY 60 tablet 5 04/03/2016 at Unknown time  . finasteride (PROSCAR) 5 MG tablet Take 5 mg by mouth daily.  11 04/03/2016 at Unknown time  . metoprolol succinate (TOPROL-XL) 25 MG 24 hr tablet TAKE 1/2 TABLET BY MOUTH 2 TIMES DAILY. 90 tablet 3 04/03/2016 at 2000  . potassium chloride SA (K-DUR,KLOR-CON) 20 MEQ tablet Take 1 tablet (20 mEq total) by mouth daily. 90 tablet 3 04/03/2016 at Unknown time  . tiotropium (SPIRIVA) 18 MCG inhalation capsule Place 18 mcg into inhaler and inhale daily.   04/03/2016 at Unknown time  . torsemide (DEMADEX) 20 MG tablet Take 2 tablets (40 mg total) by mouth daily. 180 tablet 1 04/03/2016 at Unknown time  . HYDROcodone-acetaminophen (NORCO) 10-325 MG tablet Take 0.5 tablets by mouth every 6 (six) hours as needed for severe pain. 60 tablet 0 unknown  . nitroGLYCERIN (NITROSTAT) 0.4 MG SL  tablet Place 1 tablet (0.4 mg total) under the tongue every 5 (five) minutes as needed for chest pain. 100 tablet 0 unknown   Scheduled: . aspirin EC  81 mg Oral Daily  . atorvastatin  80 mg Oral Daily  . chlorhexidine  15 mL Mouth Rinse BID  . enoxaparin (LOVENOX) injection  40 mg Subcutaneous Q24H  . feeding supplement (ENSURE ENLIVE)  237 mL Oral TID BM  . finasteride  5 mg Oral Daily  . guaiFENesin  1,200 mg Oral BID  . ipratropium-albuterol  3 mL Nebulization Q6H  . losartan  12.5 mg Oral Daily  . mouth rinse  15 mL Mouth Rinse q12n4p  . methylPREDNISolone (SOLU-MEDROL)  injection  60 mg Intravenous Q12H  . metoprolol succinate  12.5 mg Oral BID  . mometasone-formoterol  2 puff Inhalation BID  . nicotine  21 mg Transdermal Daily   Continuous:  GPQ:DIYMEBRAXENMM **OR** acetaminophen, albuterol, HYDROcodone-acetaminophen, LORazepam, ondansetron **OR** ondansetron (ZOFRAN) IV  Assesment: He was admitted with community-acquired pneumonia and sepsis. He has COPD and had acute exacerbation. He had acute on chronic hypoxic/hypercapnic respiratory failure and that is better. He is still requiring high flow oxygen. He has improved markedly over the last 72 hours. He was significantly encephalopathic and that has improved. Principal Problem:   Sepsis ([place]) Active Problems:   Essential hypertension   CAD (coronary artery disease)   Anxiety   COPD with acute exacerbation (HCC)   Chronic pain syndrome   Acute on chronic respiratory failure with hypoxia (HCC)   Chronic systolic CHF (congestive heart failure) (HCC)   Hypotension   CAP (community acquired pneumonia)   Anemia   Thrombocytopenia (HCC)   Fever   Elevated troponin   Acute encephalopathy   Palliative care encounter   Goals of care, counseling/discussion   DNR (do not resuscitate) discussion    Plan: Continue current treatments. Addendum: cxr yesterday shows "white-out" of left lung. Clinically he is ok. Increase nebs, suction  Add mucomyst. May need bronch.   LOS: 8 days   Tommy Turner L 04/12/2016, 7:41 AM

## 2016-04-12 NOTE — Progress Notes (Signed)
PROGRESS NOTE    Tommy Turner  [id number] DOB: [birth year] DOA: 04/04/2016 PCP: Sallee Lange, MD   Brief Narrative: 75 y.o. male with medical history significant of AAA, anxiety, CAD, HTN, HLD, chronic systolic CHF EF 0000000, COPD oxygen dependent (2L), MI, and tobacco abuse presented with complaint of gradually worsening shortness of breath. He was admitted to the hospital for sepsis related to pneumonia as well as COPD exacerbation. Initially he required bipap, but has since been weaned off. Hospital course has been complicated by development of confusion and agitation. He is receiving intermittent ativan. Pulmonology and cardiology following. Palliative care also consulted to discuss goals of care.  Assessment & Plan:   # Sepsis due to community acquired pneumonia: -The chest x-ray which was done yesterday showed complete opacity of left long. I will continue ceftriaxone and azithromycin (8th day) since his x-ray finding is not improving. Today, I called Dr. Luan Pulling and discussed above finding with him in detail. He may need bronchoscopy. Meantime we will continue with suctioning and respiratory therapy.  #Acute on chronic hypoxic respiratory failure: Due to pneumonia and possible COPD exacerbation: -Required BiPAP but currently on high flow oxygen. -Continue bronchodilators and respiratory therapy. -Continue Solu-Medrol every 12 hours.  # History of ischemic cardiomyopathy with chronic systolic heart failure:LVEF 20% on echo 04/09/16 which is stable. Has refused ICD in past. Cardiology following. -Palliative consult evaluation ongoing. Patient is currently DO NOT RESUSCITATE.  #Mild elevation in troponin (0.07) likely demand ischemia in the setting of sepsis. Patient is on aspirin and statin.  # PVCs/brief NSVT. continue beta blocker  # Acute encephalopathy metabolic versus medication related: Continue to provide supportive care. Ammonia level 26.  #Essential hypertension:  Continue losartan low-dose and metoprolol. Monitor blood pressure.  #Hyperlipidemia: Continue statin.  #Mild thrombocytopenia, chronic: No sign of bleeding. Platelet 144 today.   Active Problems:   Essential hypertension   CAD (coronary artery disease)   Anxiety   COPD with acute exacerbation (HCC)   Chronic pain syndrome   Acute on chronic respiratory failure with hypoxia (HCC)   Chronic systolic CHF (congestive heart failure) (HCC)   Hypotension   CAP (community acquired pneumonia)   Anemia   Thrombocytopenia (HCC)   Fever   Elevated troponin   Acute encephalopathy   Palliative care encounter   Goals of care, counseling/discussion   DNR (do not resuscitate) discussion  DVT prophylaxis:Lovenox subcutaneous  Code Status:DO NOT RESUSCITATE  Family Communication: Discussed with the patient's wife at bedside Disposition Plan:Likely discharge home with home care versus rehabilitation in 2-3 days    Consultants:   Pulmonologist and cardiologist  Procedures:BiPAP as needed  Antimicrobials: Eighth day of IV ceftriaxone and erythromycin  Subjective:Patient was seen and examined at bedside. Patient reported somewhat better today. He denied headache, dizziness, chest pain. Has dry cough and stable shortness of breath. Patient's wife at bedside. Denied nausea or vomiting.   Objective: Vitals:   04/12/16 0730 04/12/16 0744 04/12/16 0800 04/12/16 0900  BP:   106/76 (!) 109/59  Pulse:   70 70  Resp:   (!) 21 19  Temp: 98.1 F (36.7 C)     TempSrc: Oral     SpO2:  96% 91% 92%  Weight:      Height:        Intake/Output Summary (Last 24 hours) at 04/12/16 1142 Last data filed at 04/11/16 2200  Gross per 24 hour  Intake  720 ml  Output              525 ml  Net              195 ml   Filed Weights   04/10/16 0500 04/11/16 0500 04/12/16 0500  Weight: 63.3 kg (139 lb 8.8 oz) 60.2 kg (132 lb 11.5 oz) 60.2 kg (132 lb 11.5 oz)    Examination:  General  exam:Ill-looking elderly male lying on bed on high flow oxygen  Respiratory system:Decreased breath sound on the left side, right side clear Cardiovascular system: Regular rate rhythm, S1-S2 normal. No pedal edema. Gastrointestinal system: Abdomen is nondistended, soft and nontender. Normal bowel sounds heard. Central nervous system: Alert awake and following commands  Extremities: Symmetric 5 x 5 power. Skin: No rashes, lesions or ulcers Psychiatry: Judgement and insight appear impaired     Data Reviewed: I have personally reviewed following labs and imaging studies  CBC:  Recent Labs Lab 04/07/16 0436 04/08/16 0428 04/09/16 0403 04/10/16 0819 04/11/16 0356  WBC 12.7* 9.3 7.9 11.2* 10.3  HGB 11.5* 11.9* 12.6* 13.4 14.0  HCT 37.5* 39.1 41.0 42.6 43.4  MCV 100.0 101.3* 100.0 98.2 96.9  PLT 120* 119* 134* 139* [id number]*   Basic Metabolic Panel:  Recent Labs Lab 04/06/16 0359 04/07/16 0436 04/08/16 0428 04/09/16 0403 04/10/16 0819 04/11/16 0356  NA 139 143 141 141 140 141  K 4.1 4.3 4.8 4.5 4.4 4.2  CL 101 104 100* 97* 96* 96*  CO2 34* 37* 38* 39* 40* 41*  GLUCOSE 163* 157* 127* 169* 147* 158*  BUN 16 15 17 17 16 17   CREATININE 0.51* 0.41* 0.36* 0.37* 0.30* 0.36*  CALCIUM 9.2 9.8 9.9 9.7 9.5 9.5  MG 2.2  --   --   --   --   --    GFR: Estimated Creatinine Clearance: 66.9 mL/min (by C-G formula based on SCr of 0.36 mg/dL (L)). Liver Function Tests: No results for input(s): AST, ALT, ALKPHOS, BILITOT, PROT, ALBUMIN in the last 168 hours. No results for input(s): LIPASE, AMYLASE in the last 168 hours.  Recent Labs Lab 04/10/16 0846  AMMONIA 26   Coagulation Profile: No results for input(s): INR, PROTIME in the last 168 hours. Cardiac Enzymes: No results for input(s): CKTOTAL, CKMB, CKMBINDEX, TROPONINI in the last 168 hours. BNP (last 3 results) No results for input(s): PROBNP in the last 8760 hours. HbA1C: No results for input(s): HGBA1C in the last 72  hours. CBG:  Recent Labs Lab 04/11/16 2110  GLUCAP 163*   Lipid Profile: No results for input(s): CHOL, HDL, LDLCALC, TRIG, CHOLHDL, LDLDIRECT in the last 72 hours. Thyroid Function Tests: No results for input(s): TSH, T4TOTAL, FREET4, T3FREE, THYROIDAB in the last 72 hours. Anemia Panel: No results for input(s): VITAMINB12, FOLATE, FERRITIN, TIBC, IRON, RETICCTPCT in the last 72 hours. Sepsis Labs: No results for input(s): PROCALCITON, LATICACIDVEN in the last 168 hours.  Recent Results (from the past 240 hour(s))  Blood culture (routine x 2)     Status: None   Collection Time: 04/04/16  8:32 AM  Result Value Ref Range Status   Specimen Description BLOOD LEFT HAND  Final   Special Requests   Final    BOTTLES DRAWN AEROBIC AND ANAEROBIC AEB=10CC ANA=8CC   Culture NO GROWTH 7 DAYS  Final   Report Status 04/11/2016 FINAL  Final  Blood culture (routine x 2)     Status: None   Collection Time: 04/04/16  8:37 AM  Result Value Ref Range Status   Specimen Description BLOOD RIGHT HAND  Final   Special Requests BOTTLES DRAWN AEROBIC AND ANAEROBIC 10CC ANA=8CC  Final   Culture NO GROWTH 7 DAYS  Final   Report Status 04/11/2016 FINAL  Final  Urine culture     Status: Abnormal   Collection Time: 04/04/16 11:08 AM  Result Value Ref Range Status   Specimen Description URINE, CLEAN CATCH  Final   Special Requests NONE  Final   Culture MULTIPLE SPECIES PRESENT, SUGGEST RECOLLECTION (A)  Final   Report Status 04/06/2016 FINAL  Final  MRSA PCR Screening     Status: None   Collection Time: 04/04/16 12:15 PM  Result Value Ref Range Status   MRSA by PCR NEGATIVE NEGATIVE Final    Comment:        The GeneXpert MRSA Assay (FDA approved for NASAL specimens only), is one component of a comprehensive MRSA colonization surveillance program. It is not intended to diagnose MRSA infection nor to guide or monitor treatment for MRSA infections.   Culture, sputum-assessment     Status: None    Collection Time: 04/05/16 11:50 PM  Result Value Ref Range Status   Specimen Description SPUTUM  Final   Special Requests NONE  Final   Sputum evaluation   Final    THIS SPECIMEN IS ACCEPTABLE. RESPIRATORY CULTURE REPORT TO FOLLOW.   Report Status 04/06/2016 FINAL  Final  Culture, respiratory (NON-Expectorated)     Status: None   Collection Time: 04/05/16 11:50 PM  Result Value Ref Range Status   Specimen Description SPUTUM  Final   Special Requests NONE  Final   Gram Stain   Final    FEW WBC PRESENT, PREDOMINANTLY PMN RARE GRAM POSITIVE COCCI IN PAIRS AND CHAINS RARE GRAM NEGATIVE COCCOBACILLI RARE GRAM POSITIVE RODS    Culture   Final    Consistent with normal respiratory flora. Performed at [facility]    Report Status 04/08/2016 FINAL  Final         Radiology Studies: Dg Chest Port 1 View  Result Date: 04/11/2016 CLINICAL DATA:  Difficulty breathing EXAM: PORTABLE CHEST 1 VIEW COMPARISON:  04/08/2016 FINDINGS: Cardiac shadow is obscured by opacification in the left hemi thorax. There is complete opacification of the left hemi thorax noted with significant volume loss and midline shift to the left. The right lung is hyperaerated without focal infiltrate. Some mild interstitial thickening is noted. IMPRESSION: Complete opacification of the left hemi thorax with volume loss likely related to mucous plugging and consolidation. Hyper expansion of the right lung is noted. Electronically Signed   By: Inez Catalina M.D.   On: 04/11/2016 07:41        Scheduled Meds: . aspirin EC  81 mg Oral Daily  . atorvastatin  80 mg Oral Daily  . chlorhexidine  15 mL Mouth Rinse BID  . enoxaparin (LOVENOX) injection  40 mg Subcutaneous Q24H  . feeding supplement (ENSURE ENLIVE)  237 mL Oral TID BM  . finasteride  5 mg Oral Daily  . guaiFENesin  1,200 mg Oral BID  . ipratropium-albuterol  3 mL Nebulization Q6H  . losartan  12.5 mg Oral Daily  . mouth rinse  15 mL Mouth Rinse  q12n4p  . methylPREDNISolone (SOLU-MEDROL) injection  60 mg Intravenous Q12H  . metoprolol succinate  12.5 mg Oral BID  . mometasone-formoterol  2 puff Inhalation BID  . nicotine  21 mg Transdermal Daily  . torsemide  40 mg  Oral Daily   Continuous Infusions:   LOS: 8 days    Samil Mecham Tanna Furry, MD [facility] Pager [phone]  If 7PM-7AM, please contact night-coverage [email or web address] Password [email or web address] 04/12/2016, 11:42 AM

## 2016-04-12 NOTE — Care Management Note (Signed)
Case Management Note  Patient Details  Name: CHIBUIKEM PALL MRN: [id number] Date of Birth: 1940-06-13  If discussed at Long Length of Stay Meetings, dates discussed:  04/12/2016  Additional Comments: [facility] rep has delivered [facility] to pt's room.   Sherald Barge, RN 04/12/2016, 2:42 PM

## 2016-04-13 ENCOUNTER — Inpatient Hospital Stay (HOSPITAL_COMMUNITY): Payer: 59

## 2016-04-13 MED ORDER — METHYLPREDNISOLONE SODIUM SUCC 125 MG IJ SOLR
60.0000 mg | Freq: Every day | INTRAMUSCULAR | Status: DC
  Administered 2016-04-14: 60 mg via INTRAVENOUS
  Filled 2016-04-13: qty 2

## 2016-04-13 NOTE — Progress Notes (Signed)
PROGRESS NOTE    Tommy Turner  [id number] DOB: Aug 04, 1940 DOA: 04/04/2016 PCP: Sallee Lange, MD   Brief Narrative: 75 y.o. male with medical history significant of AAA, anxiety, CAD, HTN, HLD, chronic systolic CHF EF 0000000, COPD oxygen dependent (2L), MI, and tobacco abuse presented with complaint of gradually worsening shortness of breath. He was admitted to the hospital for sepsis related to pneumonia as well as COPD exacerbation. Initially he required bipap, but has since been weaned off. Hospital course has been complicated by development of confusion and agitation. He is receiving intermittent ativan. Pulmonology and cardiology following. Palliative care also consulted to discuss goals of care.  Assessment & Plan:   # Sepsis due to community acquired pneumonia: -Continue ceftriaxone and azithromycin. Patient had left lung opacity evaluated by pulmonologist. Pulmonologist do not think patient needs bronchoscopy. Repeat x-ray today showed mild improvement. Patient is currently on high flow oxygen, not requiring BiPAP. Continue current management. Continue airway suctioning and breathing treatment. Patient reported he is feeling much better.  #Acute on chronic hypoxic respiratory failure: Due to pneumonia and possible COPD exacerbation: -Required BiPAP but currently on high flow oxygen. Try to wean oxygen slowly. -Taper Solu-Medrol to daily  # History of ischemic cardiomyopathy with chronic systolic heart failure:LVEF 20% on echo 04/09/16 which is stable. Has refused ICD in past. Cardiology following. -Palliative consult evaluation ongoing. Patient is currently DO NOT RESUSCITATE.  #Mild elevation in troponin (0.07) likely demand ischemia in the setting of sepsis. Patient is on aspirin and statin.  # PVCs/brief NSVT. continue beta blocker  # Acute encephalopathy metabolic versus medication related: Improved now. Continue to provide supportive care. Ammonia level 26.  #Essential  hypertension: Blood pressure low today. I will discontinue losartan. Continue metoprolol and Demadex with parameters. Monitor blood pressure.   #Hyperlipidemia: Continue statin.  #Mild thrombocytopenia, chronic: No sign of bleeding. Monitor.   Active Problems:   Essential hypertension   CAD (coronary artery disease)   Anxiety   COPD with acute exacerbation (HCC)   Chronic pain syndrome   Acute on chronic respiratory failure with hypoxia (HCC)   Chronic systolic CHF (congestive heart failure) (HCC)   Hypotension   CAP (community acquired pneumonia)   Anemia   Thrombocytopenia (HCC)   Fever   Elevated troponin   Acute encephalopathy   Palliative care encounter   Goals of care, counseling/discussion   DNR (do not resuscitate) discussion  DVT prophylaxis:Lovenox subcutaneous  Code Status:DO NOT RESUSCITATE  Family Communication: Discussed with the patient's wife at bedside Disposition Plan:Likely discharge home with home care versus rehabilitation in 2-3 days    Consultants:   Pulmonologist and cardiologist  Procedures:BiPAP as needed  Antimicrobials: Eighth day of IV ceftriaxone and erythromycin  Subjective:Patient was seen and examined at bedside. Patient reported somewhat better today. He denied headache, dizziness, chest pain. Has dry cough and stable shortness of breath. Patient's wife at bedside. Denied nausea or vomiting.   Objective: Vitals:   04/13/16 1000 04/13/16 1100 04/13/16 1118 04/13/16 1200  BP: (!) 86/52 (!) 91/54  105/65  Pulse: 93 63  75  Resp:      Temp:      TempSrc:      SpO2: (!) 88% 94% 95% 94%  Weight:      Height:        Intake/Output Summary (Last 24 hours) at 04/13/16 1346 Last data filed at 04/13/16 0900  Gross per 24 hour  Intake  480 ml  Output             3225 ml  Net            -2745 ml   Filed Weights   04/11/16 0500 04/12/16 0500 04/13/16 0500  Weight: 60.2 kg (132 lb 11.5 oz) 60.2 kg (132 lb 11.5 oz) 58.5 kg  (128 lb 15.5 oz)    Examination:  General exam:Ill-looking elderly male lying on bed on high flow oxygen  Respiratory system:Decreased breath sound on the left side, right side clear Cardiovascular system: Regular rate rhythm, S1-S2 normal. No pedal edema. Gastrointestinal system: Abdomen is nondistended, soft and nontender. Normal bowel sounds heard. Central nervous system: Alert awake and following commands  Extremities: Symmetric 5 x 5 power. Skin: No rashes, lesions or ulcers Psychiatry: Judgement and insight appear impaired     Data Reviewed: I have personally reviewed following labs and imaging studies  CBC:  Recent Labs Lab 04/07/16 0436 04/08/16 0428 04/09/16 0403 04/10/16 0819 04/11/16 0356  WBC 12.7* 9.3 7.9 11.2* 10.3  HGB 11.5* 11.9* 12.6* 13.4 14.0  HCT 37.5* 39.1 41.0 42.6 43.4  MCV 100.0 101.3* 100.0 98.2 96.9  PLT 120* 119* 134* 139* 123456*   Basic Metabolic Panel:  Recent Labs Lab 04/07/16 0436 04/08/16 0428 04/09/16 0403 04/10/16 0819 04/11/16 0356  NA 143 141 141 140 141  K 4.3 4.8 4.5 4.4 4.2  CL 104 100* 97* 96* 96*  CO2 37* 38* 39* 40* 41*  GLUCOSE 157* 127* 169* 147* 158*  BUN 15 17 17 16 17   CREATININE 0.41* 0.36* 0.37* 0.30* 0.36*  CALCIUM 9.8 9.9 9.7 9.5 9.5   GFR: Estimated Creatinine Clearance: 65 mL/min (by C-G formula based on SCr of 0.36 mg/dL (L)). Liver Function Tests: No results for input(s): AST, ALT, ALKPHOS, BILITOT, PROT, ALBUMIN in the last 168 hours. No results for input(s): LIPASE, AMYLASE in the last 168 hours.  Recent Labs Lab 04/10/16 0846  AMMONIA 26   Coagulation Profile: No results for input(s): INR, PROTIME in the last 168 hours. Cardiac Enzymes: No results for input(s): CKTOTAL, CKMB, CKMBINDEX, TROPONINI in the last 168 hours. BNP (last 3 results) No results for input(s): PROBNP in the last 8760 hours. HbA1C: No results for input(s): HGBA1C in the last 72 hours. CBG:  Recent Labs Lab  04/11/16 2110  GLUCAP 163*   Lipid Profile: No results for input(s): CHOL, HDL, LDLCALC, TRIG, CHOLHDL, LDLDIRECT in the last 72 hours. Thyroid Function Tests: No results for input(s): TSH, T4TOTAL, FREET4, T3FREE, THYROIDAB in the last 72 hours. Anemia Panel: No results for input(s): VITAMINB12, FOLATE, FERRITIN, TIBC, IRON, RETICCTPCT in the last 72 hours. Sepsis Labs: No results for input(s): PROCALCITON, LATICACIDVEN in the last 168 hours.  Recent Results (from the past 240 hour(s))  Blood culture (routine x 2)     Status: None   Collection Time: 04/04/16  8:32 AM  Result Value Ref Range Status   Specimen Description BLOOD LEFT HAND  Final   Special Requests   Final    BOTTLES DRAWN AEROBIC AND ANAEROBIC AEB=10CC ANA=8CC   Culture NO GROWTH 7 DAYS  Final   Report Status 04/11/2016 FINAL  Final  Blood culture (routine x 2)     Status: None   Collection Time: 04/04/16  8:37 AM  Result Value Ref Range Status   Specimen Description BLOOD RIGHT HAND  Final   Special Requests BOTTLES DRAWN AEROBIC AND ANAEROBIC 10CC ANA=8CC  Final   Culture  NO GROWTH 7 DAYS  Final   Report Status 04/11/2016 FINAL  Final  Urine culture     Status: Abnormal   Collection Time: 04/04/16 11:08 AM  Result Value Ref Range Status   Specimen Description URINE, CLEAN CATCH  Final   Special Requests NONE  Final   Culture MULTIPLE SPECIES PRESENT, SUGGEST RECOLLECTION (A)  Final   Report Status 04/06/2016 FINAL  Final  MRSA PCR Screening     Status: None   Collection Time: 04/04/16 12:15 PM  Result Value Ref Range Status   MRSA by PCR NEGATIVE NEGATIVE Final    Comment:        The GeneXpert MRSA Assay (FDA approved for NASAL specimens only), is one component of a comprehensive MRSA colonization surveillance program. It is not intended to diagnose MRSA infection nor to guide or monitor treatment for MRSA infections.   Culture, sputum-assessment     Status: None   Collection Time: 04/05/16 11:50  PM  Result Value Ref Range Status   Specimen Description SPUTUM  Final   Special Requests NONE  Final   Sputum evaluation   Final    THIS SPECIMEN IS ACCEPTABLE. RESPIRATORY CULTURE REPORT TO FOLLOW.   Report Status 04/06/2016 FINAL  Final  Culture, respiratory (NON-Expectorated)     Status: None   Collection Time: 04/05/16 11:50 PM  Result Value Ref Range Status   Specimen Description SPUTUM  Final   Special Requests NONE  Final   Gram Stain   Final    FEW WBC PRESENT, PREDOMINANTLY PMN RARE GRAM POSITIVE COCCI IN PAIRS AND CHAINS RARE GRAM NEGATIVE COCCOBACILLI RARE GRAM POSITIVE RODS    Culture   Final    Consistent with normal respiratory flora. Performed at [facility]    Report Status 04/08/2016 FINAL  Final         Radiology Studies: Dg Chest Port 1 View  Result Date: 04/13/2016 CLINICAL DATA:  Respiratory failure EXAM: PORTABLE CHEST 1 VIEW COMPARISON:  04/12/2016 FINDINGS: Shift of the mediastinum to the left and opacification at the left base are consistent with significant collapse of the left lower lobe. Compared to yesterday, there is increased aeration in the left upper lung zone. The right lung remains hyperaerated and clear other then chronic parenchymal changes at the right apex. No pneumothorax. Postoperative changes are noted. IMPRESSION: There continues to be significant collapse in the left lower lobe and opacity at the left base. This has slightly improved since yesterday. Mucous plugging are underlying endobronchial lesion cannot be excluded. Electronically Signed   By: Marybelle Killings M.D.   On: 04/13/2016 08:37   Dg Chest Port 1 View  Result Date: 04/12/2016 CLINICAL DATA:  Respiratory failure EXAM: PORTABLE CHEST 1 VIEW COMPARISON:  04/11/16 FINDINGS: Cardiomediastinal silhouette is stable. Again noted almost complete opacification and volume loss of the left hemi thorax. Minimal improvement in aeration left upper lobe. Right lung is clear. Status  post median sternotomy IMPRESSION: Again noted almost complete opacification and volume loss of the left hemi thorax. Minimal improvement in aeration left upper lobe. Right lung is clear. Status post median sternotomy Electronically Signed   By: Lahoma Crocker M.D.   On: 04/12/2016 13:01        Scheduled Meds: . acetylcysteine  3 mL Nebulization Q4H  . aspirin EC  81 mg Oral Daily  . atorvastatin  80 mg Oral Daily  . azithromycin  500 mg Oral Daily  . cefTRIAXone (ROCEPHIN)  IV  1  g Intravenous Q24H  . chlorhexidine  15 mL Mouth Rinse BID  . enoxaparin (LOVENOX) injection  40 mg Subcutaneous Q24H  . feeding supplement (ENSURE ENLIVE)  237 mL Oral TID BM  . finasteride  5 mg Oral Daily  . guaiFENesin  1,200 mg Oral BID  . ipratropium-albuterol  3 mL Nebulization Q4H  . losartan  12.5 mg Oral Daily  . mouth rinse  15 mL Mouth Rinse q12n4p  . methylPREDNISolone (SOLU-MEDROL) injection  60 mg Intravenous Q12H  . metoprolol succinate  12.5 mg Oral BID  . mometasone-formoterol  2 puff Inhalation BID  . nicotine  21 mg Transdermal Daily  . torsemide  40 mg Oral Daily   Continuous Infusions:   LOS: 9 days    Sadat Sliwa Tanna Furry, MD [facility] Pager [phone]  If 7PM-7AM, please contact night-coverage [email or web address] Password [email or web address] 04/13/2016, 1:46 PM

## 2016-04-13 NOTE — Care Management Important Message (Signed)
Important Message  Patient Details  Name: Tommy Turner MRN: [id number] Date of Birth: 05/04/1941   Medicare Important Message Given:  Yes    Sherald Barge, RN 04/13/2016, 12:22 PM

## 2016-04-13 NOTE — Progress Notes (Signed)
Subjective: He says he feels better. No new complaints. He's coughing up a lot of sputum with his percussion. No other new problems are noted. No chest pain nausea vomiting diarrhea and he's not very short of breath.  Objective: Vital signs in last 24 hours: Temp:  [97.7 F (36.5 C)-98.8 F (37.1 C)] 98.7 F (37.1 C) (12/08 0400) Pulse Rate:  [54-86] 54 (12/08 0400) Resp:  [16-28] 16 (12/08 0400) BP: (100-128)/(59-76) 100/65 (12/08 0400) SpO2:  [90 %-97 %] 97 % (12/08 0456) FiO2 (%):  [65 %-70 %] 65 % (12/08 0456) Weight:  [58.5 kg (128 lb 15.5 oz)] 58.5 kg (128 lb 15.5 oz) (12/08 0500) Weight change: -1.7 kg (-3 lb 12 oz) Last BM Date: 04/12/16  Intake/Output from previous day: 12/07 0701 - 12/08 0700 In: 530 [P.O.:480; IV Piggyback:50] Out: 3225 [Urine:3225]  PHYSICAL EXAM General appearance: alert, cooperative and no distress Resp: Diminished breath sounds on the left but better. Some rhonchi on the right Cardio: Heart is regular. No gallop. No edema. GI: soft, non-tender; bowel sounds normal; no masses,  no organomegaly Extremities: extremities normal, atraumatic, no cyanosis or edema Skin warm and dry. Mucous membranes are moist.  Lab Results:  Results for orders placed or performed during the hospital encounter of 04/04/16 (from the past 48 hour(s))  Glucose, capillary     Status: Abnormal   Collection Time: 04/11/16  9:10 PM  Result Value Ref Range   Glucose-Capillary 163 (H) 65 - 99 mg/dL    ABGS No results for input(s): PHART, PO2ART, TCO2, HCO3 in the last 72 hours.  Invalid input(s): PCO2 CULTURES Recent Results (from the past 240 hour(s))  Blood culture (routine x 2)     Status: None   Collection Time: 04/04/16  8:32 AM  Result Value Ref Range Status   Specimen Description BLOOD LEFT HAND  Final   Special Requests   Final    BOTTLES DRAWN AEROBIC AND ANAEROBIC AEB=10CC ANA=8CC   Culture NO GROWTH 7 DAYS  Final   Report Status 04/11/2016 FINAL  Final   Blood culture (routine x 2)     Status: None   Collection Time: 04/04/16  8:37 AM  Result Value Ref Range Status   Specimen Description BLOOD RIGHT HAND  Final   Special Requests BOTTLES DRAWN AEROBIC AND ANAEROBIC 10CC ANA=8CC  Final   Culture NO GROWTH 7 DAYS  Final   Report Status 04/11/2016 FINAL  Final  Urine culture     Status: Abnormal   Collection Time: 04/04/16 11:08 AM  Result Value Ref Range Status   Specimen Description URINE, CLEAN CATCH  Final   Special Requests NONE  Final   Culture MULTIPLE SPECIES PRESENT, SUGGEST RECOLLECTION (A)  Final   Report Status 04/06/2016 FINAL  Final  MRSA PCR Screening     Status: None   Collection Time: 04/04/16 12:15 PM  Result Value Ref Range Status   MRSA by PCR NEGATIVE NEGATIVE Final    Comment:        The GeneXpert MRSA Assay (FDA approved for NASAL specimens only), is one component of a comprehensive MRSA colonization surveillance program. It is not intended to diagnose MRSA infection nor to guide or monitor treatment for MRSA infections.   Culture, sputum-assessment     Status: None   Collection Time: 04/05/16 11:50 PM  Result Value Ref Range Status   Specimen Description SPUTUM  Final   Special Requests NONE  Final   Sputum evaluation   Final  THIS SPECIMEN IS ACCEPTABLE. RESPIRATORY CULTURE REPORT TO FOLLOW.   Report Status 04/06/2016 FINAL  Final  Culture, respiratory (NON-Expectorated)     Status: None   Collection Time: 04/05/16 11:50 PM  Result Value Ref Range Status   Specimen Description SPUTUM  Final   Special Requests NONE  Final   Gram Stain   Final    FEW WBC PRESENT, PREDOMINANTLY PMN RARE GRAM POSITIVE COCCI IN PAIRS AND CHAINS RARE GRAM NEGATIVE COCCOBACILLI RARE GRAM POSITIVE RODS    Culture   Final    Consistent with normal respiratory flora. Performed at [facility]    Report Status 04/08/2016 FINAL  Final   Studies/Results: Dg Chest Port 1 View  Result Date:  04/12/2016 CLINICAL DATA:  Respiratory failure EXAM: PORTABLE CHEST 1 VIEW COMPARISON:  04/11/16 FINDINGS: Cardiomediastinal silhouette is stable. Again noted almost complete opacification and volume loss of the left hemi thorax. Minimal improvement in aeration left upper lobe. Right lung is clear. Status post median sternotomy IMPRESSION: Again noted almost complete opacification and volume loss of the left hemi thorax. Minimal improvement in aeration left upper lobe. Right lung is clear. Status post median sternotomy Electronically Signed   By: Lahoma Crocker M.D.   On: 04/12/2016 13:01    Medications:  Prior to Admission:  Prescriptions Prior to Admission  Medication Sig Dispense Refill Last Dose  . albuterol (PROVENTIL HFA;VENTOLIN HFA) 108 (90 Base) MCG/ACT inhaler Inhale 2 puffs into the lungs every 4 (four) hours as needed for wheezing or shortness of breath.   04/04/2016 at Unknown time  . albuterol (PROVENTIL) (2.5 MG/3ML) 0.083% nebulizer solution Take 3 mLs (2.5 mg total) by nebulization every 4 (four) hours as needed for wheezing or shortness of breath. 75 mL 12 04/04/2016 at Unknown time  . aspirin EC 81 MG tablet Take 81 mg by mouth daily.    04/03/2016 at Unknown time  . atorvastatin (LIPITOR) 80 MG tablet Take 1 tablet (80 mg total) by mouth daily. 90 tablet 3 04/03/2016 at Unknown time  . benazepril (LOTENSIN) 5 MG tablet Take 1 tablet (5 mg total) by mouth daily. 90 tablet 0 04/03/2016 at Unknown time  . budesonide-formoterol (SYMBICORT) 160-4.5 MCG/ACT inhaler Inhale 2 puffs into the lungs 2 (two) times daily. 3 Inhaler 3 04/03/2016 at Unknown time  . clonazePAM (KLONOPIN) 0.5 MG tablet TAKE 1 TABLET BY MOUTH TWICE DAILY AS NEEDED FOR ANXIETY 60 tablet 5 04/03/2016 at Unknown time  . finasteride (PROSCAR) 5 MG tablet Take 5 mg by mouth daily.  11 04/03/2016 at Unknown time  . metoprolol succinate (TOPROL-XL) 25 MG 24 hr tablet TAKE 1/2 TABLET BY MOUTH 2 TIMES DAILY. 90 tablet 3  04/03/2016 at 2000  . potassium chloride SA (K-DUR,KLOR-CON) 20 MEQ tablet Take 1 tablet (20 mEq total) by mouth daily. 90 tablet 3 04/03/2016 at Unknown time  . tiotropium (SPIRIVA) 18 MCG inhalation capsule Place 18 mcg into inhaler and inhale daily.   04/03/2016 at Unknown time  . torsemide (DEMADEX) 20 MG tablet Take 2 tablets (40 mg total) by mouth daily. 180 tablet 1 04/03/2016 at Unknown time  . HYDROcodone-acetaminophen (NORCO) 10-325 MG tablet Take 0.5 tablets by mouth every 6 (six) hours as needed for severe pain. 60 tablet 0 unknown  . nitroGLYCERIN (NITROSTAT) 0.4 MG SL tablet Place 1 tablet (0.4 mg total) under the tongue every 5 (five) minutes as needed for chest pain. 100 tablet 0 unknown   Scheduled: . acetylcysteine  3 mL Nebulization  Q4H  . aspirin EC  81 mg Oral Daily  . atorvastatin  80 mg Oral Daily  . azithromycin  500 mg Oral Daily  . cefTRIAXone (ROCEPHIN)  IV  1 g Intravenous Q24H  . chlorhexidine  15 mL Mouth Rinse BID  . enoxaparin (LOVENOX) injection  40 mg Subcutaneous Q24H  . feeding supplement (ENSURE ENLIVE)  237 mL Oral TID BM  . finasteride  5 mg Oral Daily  . guaiFENesin  1,200 mg Oral BID  . ipratropium-albuterol  3 mL Nebulization Q4H  . losartan  12.5 mg Oral Daily  . mouth rinse  15 mL Mouth Rinse q12n4p  . methylPREDNISolone (SOLU-MEDROL) injection  60 mg Intravenous Q12H  . metoprolol succinate  12.5 mg Oral BID  . mometasone-formoterol  2 puff Inhalation BID  . nicotine  21 mg Transdermal Daily  . torsemide  40 mg Oral Daily   Continuous:  [id number] **OR** acetaminophen, albuterol, HYDROcodone-acetaminophen, LORazepam, ondansetron **OR** ondansetron (ZOFRAN) IV  Assesment: He was admitted with community-acquired pneumonia acute on chronic hypoxic/hypercapnic respiratory failure COPD exacerbation and sepsis. Clinically he is markedly improved but his chest x-ray on the sixth showed almost complete white out of the left lung. He has been  on treatment for that and chest x-ray on the seventh has shown some improvement and this morning there is significant improvement. I personally reviewed all of these films. He is coughing up a lot of sputum. His oxygenation is improving and we've been able to turn down his FiO2. Principal Problem:   Sepsis ([place]) Active Problems:   Essential hypertension   CAD (coronary artery disease)   Anxiety   COPD with acute exacerbation (HCC)   Chronic pain syndrome   Acute on chronic respiratory failure with hypoxia (HCC)   Chronic systolic CHF (congestive heart failure) (HCC)   Hypotension   CAP (community acquired pneumonia)   Anemia   Thrombocytopenia (HCC)   Fever   Elevated troponin   Acute encephalopathy   Palliative care encounter   Goals of care, counseling/discussion   DNR (do not resuscitate) discussion    Plan: Continue current treatments. He is improving with conservative treatments I don't think he is going to need to have a bronchoscopy. Repeat chest x-ray in the morning. Continue Mucomyst chest PT efforts at clearing his congestion.    LOS: 9 days   Nancy Arvin L 04/13/2016, 7:23 AM

## 2016-04-14 ENCOUNTER — Inpatient Hospital Stay (HOSPITAL_COMMUNITY): Payer: 59

## 2016-04-14 MED ORDER — PREDNISONE 10 MG PO TABS
50.0000 mg | ORAL_TABLET | Freq: Every day | ORAL | Status: DC
  Administered 2016-04-15: 09:00:00 50 mg via ORAL
  Filled 2016-04-14: qty 2

## 2016-04-14 NOTE — Progress Notes (Signed)
PROGRESS NOTE    Tommy Turner  [id number] DOB: [birth year] DOA: 04/04/2016 PCP: Sallee Lange, MD   Brief Narrative: 75 y.o. male with medical history significant of AAA, anxiety, CAD, HTN, HLD, chronic systolic CHF EF 0000000, COPD oxygen dependent (2L), MI, and tobacco abuse presented with complaint of gradually worsening shortness of breath. He was admitted to the hospital for sepsis related to pneumonia as well as COPD exacerbation. Initially he required bipap, but has since been weaned off. Hospital course has been complicated by development of confusion and agitation. He is receiving intermittent ativan. Pulmonology and cardiology following. Palliative care also consulted to discuss goals of care.  Assessment & Plan:   # Sepsis due to community acquired pneumonia: -Continue ceftriaxone and azithromycin. Patient had left lung opacity evaluated by pulmonologist. Pt is on chest percussion flutter valve and mucomyst. He is coughing up copious amount of sputum. Repeat CXR improving.  Patient is currently on high flow oxygen, not requiring BiPAP. Continue current management. Continue airway suctioning and breathing treatment. Patient reported he is feeling good.  #Acute on chronic hypoxic respiratory failure: Due to pneumonia and possible COPD exacerbation: -Required BiPAP but currently on high flow oxygen. Try to wean oxygen slowly. -dc Solu-Medrol and switch to prednisone  # History of ischemic cardiomyopathy with chronic systolic heart failure:LVEF 20% on echo 04/09/16 which is stable. Has refused ICD in past. Cardiology following. -Palliative consult evaluation ongoing. Patient is currently DO NOT RESUSCITATE.  #Mild elevation in troponin (0.07) likely demand ischemia in the setting of sepsis. Patient is on aspirin and statin.  # PVCs/brief NSVT. continue beta blocker  # Acute encephalopathy metabolic versus medication related: Improved now. Continue to provide supportive care.  Ammonia level 26.  #Essential hypertension: Blood pressure borderline low. He is off losartan.Continue metoprolol and Demadex with parameters. Monitor blood pressure.   #Hyperlipidemia: Continue statin.  #Mild thrombocytopenia, chronic: No sign of bleeding. Monitor.   Active Problems:   Essential hypertension   CAD (coronary artery disease)   Anxiety   COPD with acute exacerbation (HCC)   Chronic pain syndrome   Acute on chronic respiratory failure with hypoxia (HCC)   Chronic systolic CHF (congestive heart failure) (HCC)   Hypotension   CAP (community acquired pneumonia)   Anemia   Thrombocytopenia (HCC)   Fever   Elevated troponin   Acute encephalopathy   Palliative care encounter   Goals of care, counseling/discussion   DNR (do not resuscitate) discussion  DVT prophylaxis:Lovenox subcutaneous  Code Status:DO NOT RESUSCITATE  Family Communication: wife Disposition Plan:Likely discharge home with home care versus rehabilitation in 2-3 days    Consultants:   Pulmonologist and cardiologist  Procedures:BiPAP as needed  Antimicrobials: 9th day of IV ceftriaxone and erythromycin  Subjective:Patient was seen and examined at bedside. No new event. Denied shortness of breath, chest pain, dizziness, headache, nausea or vomiting. No new event.  Objective: Vitals:   04/14/16 0900 04/14/16 1000 04/14/16 1047 04/14/16 1100  BP: 112/72 (!) 102/53  (!) 102/54  Pulse: 66 (!) 59 (!) 56 67  Resp: (!) 23 18 19 19   Temp:   97.9 F (36.6 C)   TempSrc:   Oral   SpO2: 93% 98% 97% 95%  Weight:      Height:        Intake/Output Summary (Last 24 hours) at 04/14/16 1109 Last data filed at 04/14/16 0853  Gross per 24 hour  Intake  560 ml  Output             1200 ml  Net             -640 ml   Filed Weights   04/12/16 0500 04/13/16 0500 04/14/16 0400  Weight: 60.2 kg (132 lb 11.5 oz) 58.5 kg (128 lb 15.5 oz) 57.8 kg (127 lb 6.8 oz)    Examination:  General  exam:Ill-looking elderly male lying on bed on high flow oxygen  Respiratory system:Decreased breath sound on left lower base, right side clear. Cardiovascular system: Regular rate rhythm, S1 and S2 normal. No pedal edema. Gastrointestinal system: Abdomen is nondistended, soft and nontender. Normal bowel sounds heard. Central nervous system: Alert awake and following commands  Extremities: Symmetric 5 x 5 power. Skin: No rashes, lesions or ulcers Psychiatry: Judgement and insight appear normal. mood stable    Data Reviewed: I have personally reviewed following labs and imaging studies  CBC:  Recent Labs Lab 04/08/16 0428 04/09/16 0403 04/10/16 0819 04/11/16 0356  WBC 9.3 7.9 11.2* 10.3  HGB 11.9* 12.6* 13.4 14.0  HCT 39.1 41.0 42.6 43.4  MCV 101.3* 100.0 98.2 96.9  PLT 119* 134* 139* [id number]*   Basic Metabolic Panel:  Recent Labs Lab 04/08/16 0428 04/09/16 0403 04/10/16 0819 04/11/16 0356  NA 141 141 140 141  K 4.8 4.5 4.4 4.2  CL 100* 97* 96* 96*  CO2 38* 39* 40* 41*  GLUCOSE 127* 169* 147* 158*  BUN 17 17 16 17   CREATININE 0.36* 0.37* 0.30* 0.36*  CALCIUM 9.9 9.7 9.5 9.5   GFR: Estimated Creatinine Clearance: 64.2 mL/min (by C-G formula based on SCr of 0.36 mg/dL (L)). Liver Function Tests: No results for input(s): AST, ALT, ALKPHOS, BILITOT, PROT, ALBUMIN in the last 168 hours. No results for input(s): LIPASE, AMYLASE in the last 168 hours.  Recent Labs Lab 04/10/16 0846  AMMONIA 26   Coagulation Profile: No results for input(s): INR, PROTIME in the last 168 hours. Cardiac Enzymes: No results for input(s): CKTOTAL, CKMB, CKMBINDEX, TROPONINI in the last 168 hours. BNP (last 3 results) No results for input(s): PROBNP in the last 8760 hours. HbA1C: No results for input(s): HGBA1C in the last 72 hours. CBG:  Recent Labs Lab 04/11/16 2110  GLUCAP 163*   Lipid Profile: No results for input(s): CHOL, HDL, LDLCALC, TRIG, CHOLHDL, LDLDIRECT in the last 72  hours. Thyroid Function Tests: No results for input(s): TSH, T4TOTAL, FREET4, T3FREE, THYROIDAB in the last 72 hours. Anemia Panel: No results for input(s): VITAMINB12, FOLATE, FERRITIN, TIBC, IRON, RETICCTPCT in the last 72 hours. Sepsis Labs: No results for input(s): PROCALCITON, LATICACIDVEN in the last 168 hours.  Recent Results (from the past 240 hour(s))  MRSA PCR Screening     Status: None   Collection Time: 04/04/16 12:15 PM  Result Value Ref Range Status   MRSA by PCR NEGATIVE NEGATIVE Final    Comment:        The GeneXpert MRSA Assay (FDA approved for NASAL specimens only), is one component of a comprehensive MRSA colonization surveillance program. It is not intended to diagnose MRSA infection nor to guide or monitor treatment for MRSA infections.   Culture, sputum-assessment     Status: None   Collection Time: 04/05/16 11:50 PM  Result Value Ref Range Status   Specimen Description SPUTUM  Final   Special Requests NONE  Final   Sputum evaluation   Final    THIS SPECIMEN IS ACCEPTABLE. RESPIRATORY CULTURE REPORT  TO FOLLOW.   Report Status 04/06/2016 FINAL  Final  Culture, respiratory (NON-Expectorated)     Status: None   Collection Time: 04/05/16 11:50 PM  Result Value Ref Range Status   Specimen Description SPUTUM  Final   Special Requests NONE  Final   Gram Stain   Final    FEW WBC PRESENT, PREDOMINANTLY PMN RARE GRAM POSITIVE COCCI IN PAIRS AND CHAINS RARE GRAM NEGATIVE COCCOBACILLI RARE GRAM POSITIVE RODS    Culture   Final    Consistent with normal respiratory flora. Performed at [facility]    Report Status 04/08/2016 FINAL  Final         Radiology Studies: Dg Chest Port 1 View  Result Date: 04/14/2016 CLINICAL DATA:  Respiratory failure EXAM: PORTABLE CHEST 1 VIEW COMPARISON:  04/13/2016 FINDINGS: Left pleural effusion appears decreased with slight improvement in lingular aeration. Left lower lobe collapse/ consolidation persist.  Background hyperinflation noted. Visualize right lung remains clear. Right nipple shadow noted. Right costophrenic angle is excluded on today's study. No superimposed edema or pneumothorax. Atherosclerosis noted of the aorta. Degenerative changes of the spine. Remote coronary bypass changes. IMPRESSION: Decrease in the left effusion with improved lingular aeration. Left lower lobe collapse/ consolidation persist. Thoracic aortic atherosclerosis Electronically Signed   By: Jerilynn Mages.  Shick M.D.   On: 04/14/2016 09:11   Dg Chest Port 1 View  Result Date: 04/13/2016 CLINICAL DATA:  Respiratory failure EXAM: PORTABLE CHEST 1 VIEW COMPARISON:  04/12/2016 FINDINGS: Shift of the mediastinum to the left and opacification at the left base are consistent with significant collapse of the left lower lobe. Compared to yesterday, there is increased aeration in the left upper lung zone. The right lung remains hyperaerated and clear other then chronic parenchymal changes at the right apex. No pneumothorax. Postoperative changes are noted. IMPRESSION: There continues to be significant collapse in the left lower lobe and opacity at the left base. This has slightly improved since yesterday. Mucous plugging are underlying endobronchial lesion cannot be excluded. Electronically Signed   By: Marybelle Killings M.D.   On: 04/13/2016 08:37   Dg Chest Port 1 View  Result Date: 04/12/2016 CLINICAL DATA:  Respiratory failure EXAM: PORTABLE CHEST 1 VIEW COMPARISON:  04/11/16 FINDINGS: Cardiomediastinal silhouette is stable. Again noted almost complete opacification and volume loss of the left hemi thorax. Minimal improvement in aeration left upper lobe. Right lung is clear. Status post median sternotomy IMPRESSION: Again noted almost complete opacification and volume loss of the left hemi thorax. Minimal improvement in aeration left upper lobe. Right lung is clear. Status post median sternotomy Electronically Signed   By: Lahoma Crocker M.D.   On:  04/12/2016 13:01        Scheduled Meds: . acetylcysteine  3 mL Nebulization Q4H  . aspirin EC  81 mg Oral Daily  . atorvastatin  80 mg Oral Daily  . azithromycin  500 mg Oral Daily  . cefTRIAXone (ROCEPHIN)  IV  1 g Intravenous Q24H  . chlorhexidine  15 mL Mouth Rinse BID  . enoxaparin (LOVENOX) injection  40 mg Subcutaneous Q24H  . feeding supplement (ENSURE ENLIVE)  237 mL Oral TID BM  . finasteride  5 mg Oral Daily  . guaiFENesin  1,200 mg Oral BID  . ipratropium-albuterol  3 mL Nebulization Q4H  . mouth rinse  15 mL Mouth Rinse q12n4p  . methylPREDNISolone (SOLU-MEDROL) injection  60 mg Intravenous Daily  . metoprolol succinate  12.5 mg Oral BID  . mometasone-formoterol  2 puff Inhalation BID  . nicotine  21 mg Transdermal Daily  . torsemide  40 mg Oral Daily   Continuous Infusions:   LOS: 10 days    Dron Tanna Furry, MD [facility] Pager [phone]  If 7PM-7AM, please contact night-coverage [email or web address] Password [email or web address] 04/14/2016, 11:09 AM

## 2016-04-14 NOTE — Progress Notes (Signed)
Subjective: He says he feels much better. His oxygenation is better. He is still coughing up copious amounts of sputum. He feels like his breathing is better. He's not having any chest pain. No nausea vomiting diarrhea. He's not confused.  Objective: Vital signs in last 24 hours: Temp:  [97.7 F (36.5 C)-98.3 F (36.8 C)] 98.1 F (36.7 C) (12/09 0733) Pulse Rate:  [58-93] 63 (12/09 0733) Resp:  [16-21] 21 (12/09 0733) BP: (86-110)/(46-69) 107/63 (12/09 0400) SpO2:  [88 %-97 %] 94 % (12/09 0733) FiO2 (%):  [50 %-55 %] 50 % (12/09 0727) Weight:  [57.8 kg (127 lb 6.8 oz)] 57.8 kg (127 lb 6.8 oz) (12/09 0400) Weight change: -0.7 kg (-1 lb 8.7 oz) Last BM Date: 04/12/16  Intake/Output from previous day: 12/08 0701 - 12/09 0700 In: 480 [P.O.:480] Out: 1200 [Urine:1200]  PHYSICAL EXAM General appearance: alert, cooperative and no distress Resp: His right lung is clear. Left still shows somewhat diminished breath sounds and some rhonchi Cardio: regular rate and rhythm, S1, S2 normal, no murmur, click, rub or gallop GI: soft, non-tender; bowel sounds normal; no masses,  no organomegaly Extremities: extremities normal, atraumatic, no cyanosis or edema Skin warm and dry. Mucous membranes are moist. Pupils are reactive  Lab Results:  No results found for this or any previous visit (from the past 48 hour(s)).  ABGS No results for input(s): PHART, PO2ART, TCO2, HCO3 in the last 72 hours.  Invalid input(s): PCO2 CULTURES Recent Results (from the past 240 hour(s))  Urine culture     Status: Abnormal   Collection Time: 04/04/16 11:08 AM  Result Value Ref Range Status   Specimen Description URINE, CLEAN CATCH  Final   Special Requests NONE  Final   Culture MULTIPLE SPECIES PRESENT, SUGGEST RECOLLECTION (A)  Final   Report Status 04/06/2016 FINAL  Final  MRSA PCR Screening     Status: None   Collection Time: 04/04/16 12:15 PM  Result Value Ref Range Status   MRSA by PCR NEGATIVE  NEGATIVE Final    Comment:        The GeneXpert MRSA Assay (FDA approved for NASAL specimens only), is one component of a comprehensive MRSA colonization surveillance program. It is not intended to diagnose MRSA infection nor to guide or monitor treatment for MRSA infections.   Culture, sputum-assessment     Status: None   Collection Time: 04/05/16 11:50 PM  Result Value Ref Range Status   Specimen Description SPUTUM  Final   Special Requests NONE  Final   Sputum evaluation   Final    THIS SPECIMEN IS ACCEPTABLE. RESPIRATORY CULTURE REPORT TO FOLLOW.   Report Status 04/06/2016 FINAL  Final  Culture, respiratory (NON-Expectorated)     Status: None   Collection Time: 04/05/16 11:50 PM  Result Value Ref Range Status   Specimen Description SPUTUM  Final   Special Requests NONE  Final   Gram Stain   Final    FEW WBC PRESENT, PREDOMINANTLY PMN RARE GRAM POSITIVE COCCI IN PAIRS AND CHAINS RARE GRAM NEGATIVE COCCOBACILLI RARE GRAM POSITIVE RODS    Culture   Final    Consistent with normal respiratory flora. Performed at [facility]    Report Status 04/08/2016 FINAL  Final   Studies/Results: Dg Chest Port 1 View  Result Date: 04/13/2016 CLINICAL DATA:  Respiratory failure EXAM: PORTABLE CHEST 1 VIEW COMPARISON:  04/12/2016 FINDINGS: Shift of the mediastinum to the left and opacification at the left base are consistent with significant  collapse of the left lower lobe. Compared to yesterday, there is increased aeration in the left upper lung zone. The right lung remains hyperaerated and clear other then chronic parenchymal changes at the right apex. No pneumothorax. Postoperative changes are noted. IMPRESSION: There continues to be significant collapse in the left lower lobe and opacity at the left base. This has slightly improved since yesterday. Mucous plugging are underlying endobronchial lesion cannot be excluded. Electronically Signed   By: Marybelle Killings M.D.   On: 04/13/2016  08:37   Dg Chest Port 1 View  Result Date: 04/12/2016 CLINICAL DATA:  Respiratory failure EXAM: PORTABLE CHEST 1 VIEW COMPARISON:  04/11/16 FINDINGS: Cardiomediastinal silhouette is stable. Again noted almost complete opacification and volume loss of the left hemi thorax. Minimal improvement in aeration left upper lobe. Right lung is clear. Status post median sternotomy IMPRESSION: Again noted almost complete opacification and volume loss of the left hemi thorax. Minimal improvement in aeration left upper lobe. Right lung is clear. Status post median sternotomy Electronically Signed   By: Lahoma Crocker M.D.   On: 04/12/2016 13:01    Medications:  Prior to Admission:  Prescriptions Prior to Admission  Medication Sig Dispense Refill Last Dose  . albuterol (PROVENTIL HFA;VENTOLIN HFA) 108 (90 Base) MCG/ACT inhaler Inhale 2 puffs into the lungs every 4 (four) hours as needed for wheezing or shortness of breath.   04/04/2016 at Unknown time  . albuterol (PROVENTIL) (2.5 MG/3ML) 0.083% nebulizer solution Take 3 mLs (2.5 mg total) by nebulization every 4 (four) hours as needed for wheezing or shortness of breath. 75 mL 12 04/04/2016 at Unknown time  . aspirin EC 81 MG tablet Take 81 mg by mouth daily.    04/03/2016 at Unknown time  . atorvastatin (LIPITOR) 80 MG tablet Take 1 tablet (80 mg total) by mouth daily. 90 tablet 3 04/03/2016 at Unknown time  . benazepril (LOTENSIN) 5 MG tablet Take 1 tablet (5 mg total) by mouth daily. 90 tablet 0 04/03/2016 at Unknown time  . budesonide-formoterol (SYMBICORT) 160-4.5 MCG/ACT inhaler Inhale 2 puffs into the lungs 2 (two) times daily. 3 Inhaler 3 04/03/2016 at Unknown time  . clonazePAM (KLONOPIN) 0.5 MG tablet TAKE 1 TABLET BY MOUTH TWICE DAILY AS NEEDED FOR ANXIETY 60 tablet 5 04/03/2016 at Unknown time  . finasteride (PROSCAR) 5 MG tablet Take 5 mg by mouth daily.  11 04/03/2016 at Unknown time  . metoprolol succinate (TOPROL-XL) 25 MG 24 hr tablet TAKE 1/2  TABLET BY MOUTH 2 TIMES DAILY. 90 tablet 3 04/03/2016 at 2000  . potassium chloride SA (K-DUR,KLOR-CON) 20 MEQ tablet Take 1 tablet (20 mEq total) by mouth daily. 90 tablet 3 04/03/2016 at Unknown time  . tiotropium (SPIRIVA) 18 MCG inhalation capsule Place 18 mcg into inhaler and inhale daily.   04/03/2016 at Unknown time  . torsemide (DEMADEX) 20 MG tablet Take 2 tablets (40 mg total) by mouth daily. 180 tablet 1 04/03/2016 at Unknown time  . HYDROcodone-acetaminophen (NORCO) 10-325 MG tablet Take 0.5 tablets by mouth every 6 (six) hours as needed for severe pain. 60 tablet 0 unknown  . nitroGLYCERIN (NITROSTAT) 0.4 MG SL tablet Place 1 tablet (0.4 mg total) under the tongue every 5 (five) minutes as needed for chest pain. 100 tablet 0 unknown   Scheduled: . acetylcysteine  3 mL Nebulization Q4H  . aspirin EC  81 mg Oral Daily  . atorvastatin  80 mg Oral Daily  . azithromycin  500 mg Oral Daily  .  cefTRIAXone (ROCEPHIN)  IV  1 g Intravenous Q24H  . chlorhexidine  15 mL Mouth Rinse BID  . enoxaparin (LOVENOX) injection  40 mg Subcutaneous Q24H  . feeding supplement (ENSURE ENLIVE)  237 mL Oral TID BM  . finasteride  5 mg Oral Daily  . guaiFENesin  1,200 mg Oral BID  . ipratropium-albuterol  3 mL Nebulization Q4H  . mouth rinse  15 mL Mouth Rinse q12n4p  . methylPREDNISolone (SOLU-MEDROL) injection  60 mg Intravenous Daily  . metoprolol succinate  12.5 mg Oral BID  . mometasone-formoterol  2 puff Inhalation BID  . nicotine  21 mg Transdermal Daily  . torsemide  40 mg Oral Daily   Continuous:  [id number] **OR** acetaminophen, albuterol, HYDROcodone-acetaminophen, LORazepam, ondansetron **OR** ondansetron (ZOFRAN) IV  Assesment: He was admitted with acute on chronic hypoxic and hypercapnic respiratory failure. His situation was significantly compromised by acute encephalopathy. He has community-acquired pneumonia being treated. He was found to have severe mucus plugging of the left  lung and has responded very well to chest percussion flutter valve and Mucomyst. He's coughed up copious amounts of sputum. Chest x-ray was just done and I personally reviewed and it shows marked improvement in his left lung infiltrate. Principal Problem:   Sepsis ([place]) Active Problems:   Essential hypertension   CAD (coronary artery disease)   Anxiety   COPD with acute exacerbation (HCC)   Chronic pain syndrome   Acute on chronic respiratory failure with hypoxia (HCC)   Chronic systolic CHF (congestive heart failure) (HCC)   Hypotension   CAP (community acquired pneumonia)   Anemia   Thrombocytopenia (HCC)   Fever   Elevated troponin   Acute encephalopathy   Palliative care encounter   Goals of care, counseling/discussion   DNR (do not resuscitate) discussion    Plan: Continue current treatments.    LOS: 10 days   Ray Glacken L 04/14/2016, 8:49 AM

## 2016-04-15 ENCOUNTER — Inpatient Hospital Stay (HOSPITAL_COMMUNITY): Payer: 59

## 2016-04-15 MED ORDER — PREDNISONE 20 MG PO TABS
40.0000 mg | ORAL_TABLET | Freq: Every day | ORAL | Status: DC
  Administered 2016-04-16 – 2016-04-17 (×2): 40 mg via ORAL
  Filled 2016-04-15 (×2): qty 2

## 2016-04-15 NOTE — Progress Notes (Signed)
PROGRESS NOTE    OC LEMMEN  [id number] DOB: 1941/04/08 DOA: 04/04/2016 PCP: Sallee Lange, MD   Brief Narrative: 75 y.o. male with medical history significant of AAA, anxiety, CAD, HTN, HLD, chronic systolic CHF EF 0000000, COPD oxygen dependent (2L), MI, and tobacco abuse presented with complaint of gradually worsening shortness of breath. He was admitted to the hospital for sepsis related to pneumonia as well as COPD exacerbation. Initially he required bipap, but has since been weaned off. Hospital course has been complicated by development of confusion and agitation. He is receiving intermittent ativan. Pulmonology and cardiology following. Palliative care also consulted to discuss goals of care.  Assessment & Plan:   # Sepsis due to community acquired pneumonia: -Continue ceftriaxone and azithromycin. Left lung opacity is improving, reviewed today's x-ray. Pt is on chest percussion flutter valve and mucomyst. He is currently requiring 4 L of oxygen, try to wean down slowly. Discussed with the respiratory therapist.  -Continue current management. Continue airway suctioning and breathing treatment. Patient reported he is feeling good.  #Acute on chronic hypoxic respiratory failure: Due to pneumonia and possible COPD exacerbation: -Required BiPAP but currently on high flow oxygen. Try to wean oxygen slowly. -Tapering prednisone to 40 mg daily.  # History of ischemic cardiomyopathy with chronic systolic heart failure:LVEF 20% on echo 04/09/16 which is stable. Has refused ICD in past. Cardiology following. -Palliative consult evaluation ongoing. Patient is currently DO NOT RESUSCITATE.  #Mild elevation in troponin (0.07) likely demand ischemia in the setting of sepsis. Patient is on aspirin and statin.  # PVCs/brief NSVT. continue beta blocker  # Acute encephalopathy metabolic versus medication related: Improved now. Continue to provide supportive care. Ammonia level 26.  #Essential  hypertension: Blood pressure borderline low. He is off losartan.Continue metoprolol and Demadex with parameters. Monitor blood pressure.   #Hyperlipidemia: Continue statin.  #Mild thrombocytopenia, chronic: No sign of bleeding. Monitor.   Active Problems:   Essential hypertension   CAD (coronary artery disease)   Anxiety   COPD with acute exacerbation (HCC)   Chronic pain syndrome   Acute on chronic respiratory failure with hypoxia (HCC)   Chronic systolic CHF (congestive heart failure) (HCC)   Hypotension   CAP (community acquired pneumonia)   Anemia   Thrombocytopenia (HCC)   Fever   Elevated troponin   Acute encephalopathy   Palliative care encounter   Goals of care, counseling/discussion   DNR (do not resuscitate) discussion  DVT prophylaxis:Lovenox subcutaneous  Code Status:DO NOT RESUSCITATE  Family Communication: wife Disposition Plan:Likely discharge home with home care versus rehabilitation in 2-3 days    Consultants:   Pulmonologist and cardiologist  Procedures:BiPAP as needed  Antimicrobials: 10th day of IV ceftriaxone and azithromycin  Subjective:Patient was seen and examined at bedside. Reported having confusion last night. This morning had mild shortness of breath improving with bronchodilators and breathing treatment. Wife at bedside. Denied headache, nausea, vomiting, chest pain. Objective: Vitals:   04/15/16 0600 04/15/16 0700 04/15/16 0839 04/15/16 0845  BP: (!) 103/58 132/61    Pulse: (!) 59 67    Resp: (!) 23 15    Temp:    98.4 F (36.9 C)  TempSrc:    Oral  SpO2: 96% 99% 94%   Weight:      Height:        Intake/Output Summary (Last 24 hours) at 04/15/16 1122 Last data filed at 04/15/16 0500  Gross per 24 hour  Intake  770 ml  Output             1850 ml  Net            -1080 ml   Filed Weights   04/13/16 0500 04/14/16 0400 04/15/16 0500  Weight: 58.5 kg (128 lb 15.5 oz) 57.8 kg (127 lb 6.8 oz) 58.9 kg (129 lb 13.6 oz)     Examination:  General exam:Ill-looking elderly male lying on bed on high flow oxygen  Respiratory system:Decreased breath sound on the left lower base, gradually improving. Right-sided clear. Cardiovascular system: Regular rate rhythm, S1-S2 normal. No pedal edema.. Gastrointestinal system: Abdomen is nondistended, soft and nontender. Normal bowel sounds heard. Central nervous system: Alert awake and following commands  Extremities: Symmetric 5 x 5 power. Skin: No rashes, lesions or ulcers Psychiatry: Judgement and insight appear normal. mood stable    Data Reviewed: I have personally reviewed following labs and imaging studies  CBC:  Recent Labs Lab 04/09/16 0403 04/10/16 0819 04/11/16 0356  WBC 7.9 11.2* 10.3  HGB 12.6* 13.4 14.0  HCT 41.0 42.6 43.4  MCV 100.0 98.2 96.9  PLT 134* 139* [id number]*   Basic Metabolic Panel:  Recent Labs Lab 04/09/16 0403 04/10/16 0819 04/11/16 0356  NA 141 140 141  K 4.5 4.4 4.2  CL 97* 96* 96*  CO2 39* 40* 41*  GLUCOSE 169* 147* 158*  BUN 17 16 17   CREATININE 0.37* 0.30* 0.36*  CALCIUM 9.7 9.5 9.5   GFR: Estimated Creatinine Clearance: 65.4 mL/min (by C-G formula based on SCr of 0.36 mg/dL (L)). Liver Function Tests: No results for input(s): AST, ALT, ALKPHOS, BILITOT, PROT, ALBUMIN in the last 168 hours. No results for input(s): LIPASE, AMYLASE in the last 168 hours.  Recent Labs Lab 04/10/16 0846  AMMONIA 26   Coagulation Profile: No results for input(s): INR, PROTIME in the last 168 hours. Cardiac Enzymes: No results for input(s): CKTOTAL, CKMB, CKMBINDEX, TROPONINI in the last 168 hours. BNP (last 3 results) No results for input(s): PROBNP in the last 8760 hours. HbA1C: No results for input(s): HGBA1C in the last 72 hours. CBG:  Recent Labs Lab 04/11/16 2110  GLUCAP 163*   Lipid Profile: No results for input(s): CHOL, HDL, LDLCALC, TRIG, CHOLHDL, LDLDIRECT in the last 72 hours. Thyroid Function Tests: No  results for input(s): TSH, T4TOTAL, FREET4, T3FREE, THYROIDAB in the last 72 hours. Anemia Panel: No results for input(s): VITAMINB12, FOLATE, FERRITIN, TIBC, IRON, RETICCTPCT in the last 72 hours. Sepsis Labs: No results for input(s): PROCALCITON, LATICACIDVEN in the last 168 hours.  Recent Results (from the past 240 hour(s))  Culture, sputum-assessment     Status: None   Collection Time: 04/05/16 11:50 PM  Result Value Ref Range Status   Specimen Description SPUTUM  Final   Special Requests NONE  Final   Sputum evaluation   Final    THIS SPECIMEN IS ACCEPTABLE. RESPIRATORY CULTURE REPORT TO FOLLOW.   Report Status 04/06/2016 FINAL  Final  Culture, respiratory (NON-Expectorated)     Status: None   Collection Time: 04/05/16 11:50 PM  Result Value Ref Range Status   Specimen Description SPUTUM  Final   Special Requests NONE  Final   Gram Stain   Final    FEW WBC PRESENT, PREDOMINANTLY PMN RARE GRAM POSITIVE COCCI IN PAIRS AND CHAINS RARE GRAM NEGATIVE COCCOBACILLI RARE GRAM POSITIVE RODS    Culture   Final    Consistent with normal respiratory flora. Performed at [facility]  Report Status 04/08/2016 FINAL  Final         Radiology Studies: Dg Chest Port 1 View  Result Date: 04/15/2016 CLINICAL DATA:  Respiratory failure. EXAM: PORTABLE CHEST 1 VIEW COMPARISON:  04/14/2016 FINDINGS: Patient is rotated to the left. Sternotomy wires are unchanged. Lungs are adequately inflated demonstrate stable opacification over the left base likely moderate size effusion with atelectasis. Cannot exclude infection in the left base. Mild prominence of the perihilar markings unchanged. Stable cardiomegaly. Remainder of the exam is unchanged. IMPRESSION: Stable left base opacification compatible with moderate size effusion and associated atelectasis. Cannot exclude infection in the left base. Mild stable cardiomegaly with suggestion mild vascular congestion. Electronically Signed   By:  Marin Olp M.D.   On: 04/15/2016 07:13   Dg Chest Port 1 View  Result Date: 04/14/2016 CLINICAL DATA:  Respiratory failure EXAM: PORTABLE CHEST 1 VIEW COMPARISON:  04/13/2016 FINDINGS: Left pleural effusion appears decreased with slight improvement in lingular aeration. Left lower lobe collapse/ consolidation persist. Background hyperinflation noted. Visualize right lung remains clear. Right nipple shadow noted. Right costophrenic angle is excluded on today's study. No superimposed edema or pneumothorax. Atherosclerosis noted of the aorta. Degenerative changes of the spine. Remote coronary bypass changes. IMPRESSION: Decrease in the left effusion with improved lingular aeration. Left lower lobe collapse/ consolidation persist. Thoracic aortic atherosclerosis Electronically Signed   By: Jerilynn Mages.  Shick M.D.   On: 04/14/2016 09:11        Scheduled Meds: . acetylcysteine  3 mL Nebulization Q4H  . aspirin EC  81 mg Oral Daily  . atorvastatin  80 mg Oral Daily  . azithromycin  500 mg Oral Daily  . cefTRIAXone (ROCEPHIN)  IV  1 g Intravenous Q24H  . chlorhexidine  15 mL Mouth Rinse BID  . enoxaparin (LOVENOX) injection  40 mg Subcutaneous Q24H  . feeding supplement (ENSURE ENLIVE)  237 mL Oral TID BM  . finasteride  5 mg Oral Daily  . guaiFENesin  1,200 mg Oral BID  . ipratropium-albuterol  3 mL Nebulization Q4H  . mouth rinse  15 mL Mouth Rinse q12n4p  . metoprolol succinate  12.5 mg Oral BID  . mometasone-formoterol  2 puff Inhalation BID  . nicotine  21 mg Transdermal Daily  . [START ON 04/16/2016] predniSONE  40 mg Oral Q breakfast  . torsemide  40 mg Oral Daily   Continuous Infusions:   LOS: 11 days    Faizah Kandler Tanna Furry, MD [facility] Pager [phone]  If 7PM-7AM, please contact night-coverage [email or web address] Password [email or web address] 04/15/2016, 11:22 AM

## 2016-04-15 NOTE — Progress Notes (Signed)
Patient placed on 4 lpm / [place]

## 2016-04-15 NOTE — Progress Notes (Signed)
Subjective: He had a little more trouble with confusion last night. Otherwise he says he is doing okay. He complains of being sore from coughing. No other new complaints. No chest pain except for from coughing. He has abdominal pain from coughing. No nausea or vomiting.  Objective: Vital signs in last 24 hours: Temp:  [97.9 F (36.6 C)-98.8 F (37.1 C)] 98.4 F (36.9 C) (12/10 0845) Pulse Rate:  [56-76] 67 (12/10 0700) Resp:  [15-25] 15 (12/10 0700) BP: (89-132)/(48-80) 132/61 (12/10 0700) SpO2:  [93 %-99 %] 94 % (12/10 0839) FiO2 (%):  [40 %-50 %] 40 % (12/10 0011) Weight:  [58.9 kg (129 lb 13.6 oz)] 58.9 kg (129 lb 13.6 oz) (12/10 0500) Weight change: 1.1 kg (2 lb 6.8 oz) Last BM Date: 04/14/16  Intake/Output from previous day: 12/09 0701 - 12/10 0700 In: 1140 [P.O.:1040; I.V.:50; IV Piggyback:50] Out: 1850 [Urine:1850]  PHYSICAL EXAM General appearance: alert, cooperative and mild distress Resp: clear to auscultation bilaterally Cardio: regular rate and rhythm, S1, S2 normal, no murmur, click, rub or gallop GI: soft, non-tender; bowel sounds normal; no masses,  no organomegaly Extremities: extremities normal, atraumatic, no cyanosis or edema Skin warm and dry. Mucous membranes moist. Pupils react.  Lab Results:  No results found for this or any previous visit (from the past 48 hour(s)).  ABGS No results for input(s): PHART, PO2ART, TCO2, HCO3 in the last 72 hours.  Invalid input(s): PCO2 CULTURES Recent Results (from the past 240 hour(s))  Culture, sputum-assessment     Status: None   Collection Time: 04/05/16 11:50 PM  Result Value Ref Range Status   Specimen Description SPUTUM  Final   Special Requests NONE  Final   Sputum evaluation   Final    THIS SPECIMEN IS ACCEPTABLE. RESPIRATORY CULTURE REPORT TO FOLLOW.   Report Status 04/06/2016 FINAL  Final  Culture, respiratory (NON-Expectorated)     Status: None   Collection Time: 04/05/16 11:50 PM  Result Value Ref  Range Status   Specimen Description SPUTUM  Final   Special Requests NONE  Final   Gram Stain   Final    FEW WBC PRESENT, PREDOMINANTLY PMN RARE GRAM POSITIVE COCCI IN PAIRS AND CHAINS RARE GRAM NEGATIVE COCCOBACILLI RARE GRAM POSITIVE RODS    Culture   Final    Consistent with normal respiratory flora. Performed at [facility]    Report Status 04/08/2016 FINAL  Final   Studies/Results: Dg Chest Port 1 View  Result Date: 04/15/2016 CLINICAL DATA:  Respiratory failure. EXAM: PORTABLE CHEST 1 VIEW COMPARISON:  04/14/2016 FINDINGS: Patient is rotated to the left. Sternotomy wires are unchanged. Lungs are adequately inflated demonstrate stable opacification over the left base likely moderate size effusion with atelectasis. Cannot exclude infection in the left base. Mild prominence of the perihilar markings unchanged. Stable cardiomegaly. Remainder of the exam is unchanged. IMPRESSION: Stable left base opacification compatible with moderate size effusion and associated atelectasis. Cannot exclude infection in the left base. Mild stable cardiomegaly with suggestion mild vascular congestion. Electronically Signed   By: Marin Olp M.D.   On: 04/15/2016 07:13   Dg Chest Port 1 View  Result Date: 04/14/2016 CLINICAL DATA:  Respiratory failure EXAM: PORTABLE CHEST 1 VIEW COMPARISON:  04/13/2016 FINDINGS: Left pleural effusion appears decreased with slight improvement in lingular aeration. Left lower lobe collapse/ consolidation persist. Background hyperinflation noted. Visualize right lung remains clear. Right nipple shadow noted. Right costophrenic angle is excluded on today's study. No superimposed edema or pneumothorax.  Atherosclerosis noted of the aorta. Degenerative changes of the spine. Remote coronary bypass changes. IMPRESSION: Decrease in the left effusion with improved lingular aeration. Left lower lobe collapse/ consolidation persist. Thoracic aortic atherosclerosis Electronically  Signed   By: Jerilynn Mages.  Shick M.D.   On: 04/14/2016 09:11    Medications:  Prior to Admission:  Prescriptions Prior to Admission  Medication Sig Dispense Refill Last Dose  . albuterol (PROVENTIL HFA;VENTOLIN HFA) 108 (90 Base) MCG/ACT inhaler Inhale 2 puffs into the lungs every 4 (four) hours as needed for wheezing or shortness of breath.   04/04/2016 at Unknown time  . albuterol (PROVENTIL) (2.5 MG/3ML) 0.083% nebulizer solution Take 3 mLs (2.5 mg total) by nebulization every 4 (four) hours as needed for wheezing or shortness of breath. 75 mL 12 04/04/2016 at Unknown time  . aspirin EC 81 MG tablet Take 81 mg by mouth daily.    04/03/2016 at Unknown time  . atorvastatin (LIPITOR) 80 MG tablet Take 1 tablet (80 mg total) by mouth daily. 90 tablet 3 04/03/2016 at Unknown time  . benazepril (LOTENSIN) 5 MG tablet Take 1 tablet (5 mg total) by mouth daily. 90 tablet 0 04/03/2016 at Unknown time  . budesonide-formoterol (SYMBICORT) 160-4.5 MCG/ACT inhaler Inhale 2 puffs into the lungs 2 (two) times daily. 3 Inhaler 3 04/03/2016 at Unknown time  . clonazePAM (KLONOPIN) 0.5 MG tablet TAKE 1 TABLET BY MOUTH TWICE DAILY AS NEEDED FOR ANXIETY 60 tablet 5 04/03/2016 at Unknown time  . finasteride (PROSCAR) 5 MG tablet Take 5 mg by mouth daily.  11 04/03/2016 at Unknown time  . metoprolol succinate (TOPROL-XL) 25 MG 24 hr tablet TAKE 1/2 TABLET BY MOUTH 2 TIMES DAILY. 90 tablet 3 04/03/2016 at 2000  . potassium chloride SA (K-DUR,KLOR-CON) 20 MEQ tablet Take 1 tablet (20 mEq total) by mouth daily. 90 tablet 3 04/03/2016 at Unknown time  . tiotropium (SPIRIVA) 18 MCG inhalation capsule Place 18 mcg into inhaler and inhale daily.   04/03/2016 at Unknown time  . torsemide (DEMADEX) 20 MG tablet Take 2 tablets (40 mg total) by mouth daily. 180 tablet 1 04/03/2016 at Unknown time  . HYDROcodone-acetaminophen (NORCO) 10-325 MG tablet Take 0.5 tablets by mouth every 6 (six) hours as needed for severe pain. 60 tablet 0  unknown  . nitroGLYCERIN (NITROSTAT) 0.4 MG SL tablet Place 1 tablet (0.4 mg total) under the tongue every 5 (five) minutes as needed for chest pain. 100 tablet 0 unknown   Scheduled: . acetylcysteine  3 mL Nebulization Q4H  . aspirin EC  81 mg Oral Daily  . atorvastatin  80 mg Oral Daily  . azithromycin  500 mg Oral Daily  . cefTRIAXone (ROCEPHIN)  IV  1 g Intravenous Q24H  . chlorhexidine  15 mL Mouth Rinse BID  . enoxaparin (LOVENOX) injection  40 mg Subcutaneous Q24H  . feeding supplement (ENSURE ENLIVE)  237 mL Oral TID BM  . finasteride  5 mg Oral Daily  . guaiFENesin  1,200 mg Oral BID  . ipratropium-albuterol  3 mL Nebulization Q4H  . mouth rinse  15 mL Mouth Rinse q12n4p  . metoprolol succinate  12.5 mg Oral BID  . mometasone-formoterol  2 puff Inhalation BID  . nicotine  21 mg Transdermal Daily  . predniSONE  50 mg Oral Q breakfast  . torsemide  40 mg Oral Daily   Continuous:  [id number] **OR** acetaminophen, albuterol, HYDROcodone-acetaminophen, LORazepam, ondansetron **OR** ondansetron (ZOFRAN) IV  Assesment: He was admitted with community-acquired pneumonia acute  on chronic hypoxic/hypercapnic respiratory failure COPD exacerbation and sepsis. He later developed mucus plugging on the left lung. He is generally improved. He's been having trouble with encephalopathy and had a little more trouble with that last night and early this morning. His oxygenation is good he's on 3 L nasal cannula now and O2 saturations running in the 92 range. Chest x-ray was done and I personally reviewed that and it shows essentially complete resolution of the "white out" of his left lung. Principal Problem:   Sepsis ([place]) Active Problems:   Essential hypertension   CAD (coronary artery disease)   Anxiety   COPD with acute exacerbation (HCC)   Chronic pain syndrome   Acute on chronic respiratory failure with hypoxia (HCC)   Chronic systolic CHF (congestive heart failure) (HCC)    Hypotension   CAP (community acquired pneumonia)   Anemia   Thrombocytopenia (HCC)   Fever   Elevated troponin   Acute encephalopathy   Palliative care encounter   Goals of care, counseling/discussion   DNR (do not resuscitate) discussion    Plan: Continue treatments. He can stop Mucomyst at 72 hours. Continue nasal cannula.    LOS: 11 days   Leonell Lobdell L 04/15/2016, 9:48 AM

## 2016-04-16 ENCOUNTER — Ambulatory Visit: Payer: 59 | Admitting: Family Medicine

## 2016-04-16 LAB — C DIFFICILE QUICK SCREEN W PCR REFLEX
C DIFFICLE (CDIFF) ANTIGEN: NEGATIVE
C Diff interpretation: NOT DETECTED
C Diff toxin: NEGATIVE

## 2016-04-16 LAB — CBC
HCT: 45.2 % (ref 39.0–52.0)
Hemoglobin: 14.4 g/dL (ref 13.0–17.0)
MCH: 31 pg (ref 26.0–34.0)
MCHC: 31.9 g/dL (ref 30.0–36.0)
MCV: 97.4 fL (ref 78.0–100.0)
PLATELETS: 133 10*3/uL — AB (ref 150–400)
RBC: 4.64 MIL/uL (ref 4.22–5.81)
RDW: 12.9 % (ref 11.5–15.5)
WBC: 21.8 10*3/uL — ABNORMAL HIGH (ref 4.0–10.5)

## 2016-04-16 LAB — BASIC METABOLIC PANEL
Anion gap: 8 (ref 5–15)
BUN: 15 mg/dL (ref 6–20)
CALCIUM: 9.1 mg/dL (ref 8.9–10.3)
CO2: 39 mmol/L — ABNORMAL HIGH (ref 22–32)
CREATININE: 0.47 mg/dL — AB (ref 0.61–1.24)
Chloride: 90 mmol/L — ABNORMAL LOW (ref 101–111)
GFR calc Af Amer: >60 mL/min (ref >60)
GLUCOSE: 121 mg/dL — AB (ref 65–99)
Potassium: 3.4 mmol/L — ABNORMAL LOW (ref 3.5–5.1)
SODIUM: 137 mmol/L (ref 135–145)

## 2016-04-16 LAB — BLOOD GAS, ARTERIAL
ACID-BASE EXCESS: 15 mmol/L — AB (ref 0.0–2.0)
BICARBONATE: 37.7 mmol/L — AB (ref 20.0–28.0)
Drawn by: 234301
O2 Content: 3 L/min
O2 Saturation: 92.3 %
PCO2 ART: 51 mmHg — AB (ref 32.0–48.0)
PH ART: 7.5 — AB (ref 7.350–7.450)
PO2 ART: 66.1 mmHg — AB (ref 83.0–108.0)
Patient temperature: 37

## 2016-04-16 LAB — AMMONIA: Ammonia: 18 umol/L (ref 9–35)

## 2016-04-16 MED ORDER — IPRATROPIUM-ALBUTEROL 0.5-2.5 (3) MG/3ML IN SOLN
3.0000 mL | Freq: Four times a day (QID) | RESPIRATORY_TRACT | Status: DC
  Administered 2016-04-17: 3 mL via RESPIRATORY_TRACT
  Filled 2016-04-16: qty 3

## 2016-04-16 MED ORDER — IPRATROPIUM-ALBUTEROL 0.5-2.5 (3) MG/3ML IN SOLN
3.0000 mL | RESPIRATORY_TRACT | Status: DC
  Administered 2016-04-16 (×4): 3 mL via RESPIRATORY_TRACT
  Filled 2016-04-16 (×4): qty 3

## 2016-04-16 NOTE — Progress Notes (Signed)
Nebs decreased to q4 while awake for patient sleep and improvement.

## 2016-04-16 NOTE — Progress Notes (Signed)
Daily Progress Note   Patient Name: Tommy Turner       Date: 04/16/2016 DOB: 03-27-1941  Age: 75 y.o. MRN#: [id number] Attending Physician: Rosita Fire, MD Primary Care Physician: Sallee Lange, MD Admit Date: 04/04/2016  Reason for Consultation/Follow-up: Establishing goals of care and Psychosocial/spiritual support  Subjective: Tommy Turner is resting quietly in bed. He is able to greet me, make and keep eye contact as I enter. Present at bedside today is daughter Kieth Brightly. We talk about disposition, rehab versus home health. Tommy Turner is unable to participate fully in these discussions at this time. He is oriented to person in place but not time. Kieth Brightly states that Tommy Turner's wife, [name], makes all decisions. I share my concern about functional decline after extended stay in ICU. We talk about home with hospice. Tommy Turner states that when he passes, he wants things his way, "I want to be alone".  He states that when his time comes to pass he wants to be alone, with no kids in the house.  I share that our goal is to make this time be the best it can. Continue discussions with his wife regarding the benefits of SNF for rehab when she is available.  Length of Stay: 12  Current Medications: Scheduled Meds:  . aspirin EC  81 mg Oral Daily  . atorvastatin  80 mg Oral Daily  . azithromycin  500 mg Oral Daily  . cefTRIAXone (ROCEPHIN)  IV  1 g Intravenous Q24H  . chlorhexidine  15 mL Mouth Rinse BID  . enoxaparin (LOVENOX) injection  40 mg Subcutaneous Q24H  . feeding supplement (ENSURE ENLIVE)  237 mL Oral TID BM  . finasteride  5 mg Oral Daily  . guaiFENesin  1,200 mg Oral BID  . ipratropium-albuterol  3 mL Nebulization Q4H WA  . mouth rinse  15 mL Mouth Rinse q12n4p  .  metoprolol succinate  12.5 mg Oral BID  . mometasone-formoterol  2 puff Inhalation BID  . nicotine  21 mg Transdermal Daily  . predniSONE  40 mg Oral Q breakfast  . torsemide  40 mg Oral Daily    Continuous Infusions:   PRN Meds: acetaminophen **OR** acetaminophen, albuterol, HYDROcodone-acetaminophen, LORazepam, ondansetron **OR** ondansetron (ZOFRAN) IV  Physical Exam  Constitutional:  Weak and frail, thin  HENT:  Head: Normocephalic and atraumatic.  Cardiovascular: Normal rate and regular rhythm.   Pulmonary/Chest:  Hi flow nasal cannula, some work of breathing noted  Abdominal: Soft. He exhibits no distension.  Musculoskeletal:  Muscle wasting, weak  Neurological: He is alert.  Oriented to person and place  Skin: Skin is warm and dry.  Vitals reviewed.           Vital Signs: BP (!) 112/94   Pulse 88   Temp 97.3 F (36.3 C) (Oral)   Resp (!) 22   Ht 5\' 7"  (1.702 m)   Wt 60.3 kg (132 lb 15 oz)   SpO2 91%   BMI 20.82 kg/m  SpO2: SpO2: 91 % O2 Device: O2 Device: High Flow Nasal Cannula O2 Flow Rate: O2 Flow Rate (L/min): 3 L/min  Intake/output summary:  Intake/Output Summary (Last 24 hours) at 04/16/16 1423 Last data filed at 04/16/16 1400  Gross per 24 hour  Intake              120 ml  Output             5100 ml  Net            -4980 ml   LBM: Last BM Date: 04/16/16 Baseline Weight: Weight: 61.7 kg (136 lb) Most recent weight: Weight: 60.3 kg (132 lb 15 oz)       Palliative Assessment/Data:    Flowsheet Rows   Flowsheet Row Most Recent Value  Intake Tab  Referral Department  Hospitalist  Unit at Time of Referral  ICU  Palliative Care Primary Diagnosis  Cardiac  Date Notified  04/09/16  Palliative Care Type  New Palliative care  Reason for referral  Clarify Goals of Care  Date of Admission  04/04/16  Date first seen by Palliative Care  04/09/16  # of days Palliative referral response time  0 Day(s)  # of days IP prior to Palliative referral  5    Clinical Assessment  Palliative Performance Scale Score  20%  Pain Max last 24 hours  Not able to report  Pain Min Last 24 hours  Not able to report  Dyspnea Max Last 24 Hours  Not able to report  Dyspnea Min Last 24 hours  Not able to report  Psychosocial & Spiritual Assessment  Palliative Care Outcomes  Patient/Family meeting held?  Yes  Who was at the meeting?  brief meeting today with wife at bedside. family meeting 12/5 at 1000  Palliative Care follow-up planned  -- [follow up while at [place]      Patient Active Problem List   Diagnosis Date Noted  . Palliative care encounter   . Goals of care, counseling/discussion   . DNR (do not resuscitate) discussion   . Acute encephalopathy 04/07/2016  . Anemia 04/05/2016  . Thrombocytopenia ([place]) 04/05/2016  . Fever 04/05/2016  . Elevated troponin 04/05/2016  . CAP (community acquired pneumonia) 04/04/2016  . Sepsis ([place]) 04/04/2016  . BPH (benign prostatic hypertrophy) with urinary retention 07/01/2015  . Hypotension 05/24/2015  . Systolic CHF, chronic ([place]) 05/24/2015  . Protein-calorie malnutrition, severe 05/04/2015  . Acute on chronic respiratory failure with hypoxia ([place]) 05/03/2015  . Chronic systolic CHF (congestive heart failure) ([place]) 05/03/2015  . Leg swelling- Bilateral leg / foot 10/21/2014  . Abdominal aortic aneurysm without rupture ([place]) 09/14/2014  . Dyspnea 06/03/2014  . Elevated PSA 03/09/2014  . Peripheral vascular disease, unspecified 01/22/2014  . Chronic pain syndrome 10/27/2013  . Hyperglycemia 04/09/2013  .  Acute respiratory failure with hypoxia ([place]) 04/09/2013  . COPD with acute exacerbation ([place]) 04/05/2013  . COPD exacerbation ([place]) 04/05/2013  . Loss of weight 03/18/2013  . Early satiety 03/18/2013  . Dysphagia, unspecified(787.20) 03/18/2013  . COPD (chronic obstructive pulmonary disease) ([place]) 10/31/2012  . Hyperlipemia 10/31/2012  . CAD (coronary artery disease) 10/31/2012  . Anxiety  10/31/2012  . Abdominal aortic aneurysm ([place]) 02/02/2011  . TOBACCO ABUSE 09/20/2009  . PEPTIC ULCER DISEASE 09/20/2009  . Essential hypertension 09/19/2009  . BRONCHITIS, CHRONIC 09/19/2009    Palliative Care Assessment & Plan   Patient Profile: 75 y.o.malewith past medical history of COPD with home O2 dependence, anxiety and depression, CAD, CHF, AAA with stenting 2 1/2 years ago, hypertension and increase cholesterol, MI20 years ago, gastric ulcer requiring surgery 25 years agoadmitted on 11/29/2017with sepsis related to cocute on chronicmmunity acquired pneumonia.   Assessment: Acute on chronic respiratory failure; managed by pulmonary, continues with improvements in respiratory status, but continues also with encephalopathy. Continue supportive care.   Recommendations/Plan:  continue to treat the treatable, but no extraordinary measures such as CPR or intubation. Family discussions need to continue related to disposition home with home health versus SNF for rehab.  Goals of Care and Additional Recommendations:  Limitations on Scope of Treatment: As above, continued to treat the treatable but no extraordinary measures such as CPR or intubation  Code Status:    Code Status Orders        Start     Ordered   04/10/16 1213  Do not attempt resuscitation (DNR)  Continuous    Question Answer Comment  In the event of cardiac or respiratory ARREST Do not call a "code blue"   In the event of cardiac or respiratory ARREST Do not perform Intubation, CPR, defibrillation or ACLS   In the event of cardiac or respiratory ARREST Use medication by any route, position, wound care, and other measures to relive pain and suffering. May use oxygen, suction and manual treatment of airway obstruction as needed for comfort.      04/10/16 1212    Code Status History    Date Active Date Inactive Code Status Order ID Comments User Context   04/04/2016  3:42 PM 04/04/2016  3:42 PM Full Code  [id number]  Kathie Dike, MD Inpatient   04/04/2016  3:42 PM 04/10/2016 12:12 PM Full Code [id number]  Kathie Dike, MD Inpatient   11/24/2015  2:16 PM 11/25/2015  1:48 PM Full Code [id number]  Irine Seal, MD Inpatient   05/02/2015  2:17 PM 05/10/2015  7:38 PM Full Code [id number]  Orvan Falconer, MD Inpatient   09/14/2014  2:27 PM 09/15/2014  6:37 PM Full Code [id number]  Alvia Grove, PA-C Inpatient   04/05/2013  4:33 PM 04/12/2013 10:13 PM Full Code [id number]  Donne Hazel, MD ED       Prognosis:   < 6 months, would not be surprising based on frailty, functional decline, severity of cardiovascular/pulmonary compromise  Discharge Planning:  Wife stated last week her goal was to return home with home health. Family discussions need to continue related to disposition home with home health versus SNF for rehab.  Care plan was discussed with nursing staff, CM, as to be, and Dr. Carolin Sicks on next rounds  Thank you for allowing the Palliative Medicine Team to assist in the care of this patient.   Time In: 1005  Time Out: 1030  Total Time 25 minutes  Prolonged Time Billed  no  Greater than 50%  of this time was spent counseling and coordinating care related to the above assessment and plan.  Drue Novel, NP  Please contact Palliative Medicine Team phone at [phone] for questions and concerns.

## 2016-04-16 NOTE — Progress Notes (Signed)
PROGRESS NOTE    Tommy Turner  [id number] DOB: [birth year] DOA: 04/04/2016 PCP: Sallee Lange, MD   Brief Narrative: 75 y.o. male with medical history significant of AAA, anxiety, CAD, HTN, HLD, chronic systolic CHF EF 0000000, COPD oxygen dependent (2L), MI, and tobacco abuse presented with complaint of gradually worsening shortness of breath. He was admitted to the hospital for sepsis related to pneumonia as well as COPD exacerbation. Initially he required bipap, but has since been weaned off. Hospital course has been complicated by development of confusion and agitation. He is receiving intermittent ativan. Pulmonology and cardiology following. Palliative care also consulted to discuss goals of care.  Assessment & Plan:   # Sepsis due to community acquired pneumonia: -Completing ceftriaxone and azithromycin, 10 days course today likely discontinue antibiotics tomorrow. Left lung opacity is improving. Pt is on chest percussion flutter valve and mucomyst. He is currently requiring 3 L of oxygen -Continue current management. Continue airway suctioning and breathing treatment. Patient reported he is feeling good. -I will repeat chest x-ray tomorrow.  #Acute on chronic hypoxic respiratory failure: Due to pneumonia and possible COPD exacerbation: -Required BiPAP but currently on 3 L of oxygen. -Tapering prednisone to 40 mg daily.  # History of ischemic cardiomyopathy with chronic systolic heart failure:LVEF 20% on echo 04/09/16 which is stable. Has refused ICD in past. Cardiology following. -Palliative consult evaluation ongoing. Patient is currently DO NOT RESUSCITATE.  #Mild elevation in troponin (0.07) likely demand ischemia in the setting of sepsis. Patient is on aspirin and statin.  # PVCs/brief NSVT. continue beta blocker  # Acute encephalopathy metabolic versus medication related: Concern for confusion today. ABG done which showed PCO2 51 which is better than before. I will check  ammonia level. No focal neurological deficit. Continue to monitor.   #Diarrhea: Infectious versus related with antibiotics. Abdomen exam benign. Leukocytosis could be due to steroid use. Checking stool C diff  #Essential hypertension: Blood pressure borderline low. Continue to hold losartan.Continue metoprolol and Demadex with parameters. Monitor blood pressure.   #Hyperlipidemia: Continue statin.  #Mild thrombocytopenia, chronic: No sign of bleeding. Monitor.   Active Problems:   Essential hypertension   CAD (coronary artery disease)   Anxiety   COPD with acute exacerbation (HCC)   Chronic pain syndrome   Acute on chronic respiratory failure with hypoxia (HCC)   Chronic systolic CHF (congestive heart failure) (HCC)   Hypotension   CAP (community acquired pneumonia)   Anemia   Thrombocytopenia (HCC)   Fever   Elevated troponin   Acute encephalopathy   Palliative care encounter   Goals of care, counseling/discussion   DNR (do not resuscitate) discussion  DVT prophylaxis:Lovenox subcutaneous  Code Status:DO NOT RESUSCITATE  Family Communication: No family present at bedside Disposition Plan: Likely discharge home with home care in 1-2 days.    Consultants:   Pulmonologist and cardiologist  Procedures:BiPAP as needed  Antimicrobials: 10th day of IV ceftriaxone and azithromycin  Subjective:Patient was seen and examined at bedside. Reported episodes of infusion this morning or last night. Has weakness. Denied shortness of breath or chest pain. Continue to have cough. Denied fever, headache, dizziness. Objective: Vitals:   04/16/16 1100 04/16/16 1200 04/16/16 1225 04/16/16 1400  BP: (!) 118/59 103/68  (!) 112/94  Pulse: 88 80  88  Resp: (!) 21 (!) 22    Temp:      TempSrc:      SpO2: 92% 91% 92% 91%  Weight:      Height:  Intake/Output Summary (Last 24 hours) at 04/16/16 1412 Last data filed at 04/16/16 1400  Gross per 24 hour  Intake              120 ml    Output             5100 ml  Net            -4980 ml   Filed Weights   04/14/16 0400 04/15/16 0500 04/16/16 0455  Weight: 57.8 kg (127 lb 6.8 oz) 58.9 kg (129 lb 13.6 oz) 60.3 kg (132 lb 15 oz)    Examination:  General exam:Ill-looking elderly male lying on bed, not in distress Respiratory system: Clear bilaterally, no wheezing appreciated. Cardiovascular system: Regular rate rhythm, S1 and S2 normal. No pedal edema Gastrointestinal system: Abdomen is nondistended, soft and nontender. Normal bowel sounds heard. Central nervous system: Alert awake and following commands  Extremities: Symmetric 5 x 5 power. Skin: No rashes, lesions or ulcers Psychiatry: Judgement and insight appear normal. mood stable    Data Reviewed: I have personally reviewed following labs and imaging studies  CBC:  Recent Labs Lab 04/10/16 0819 04/11/16 0356 04/16/16 1002  WBC 11.2* 10.3 21.8*  HGB 13.4 14.0 14.4  HCT 42.6 43.4 45.2  MCV 98.2 96.9 97.4  PLT 139* 144* [id number]*   Basic Metabolic Panel:  Recent Labs Lab 04/10/16 0819 04/11/16 0356 04/16/16 1002  NA 140 141 137  K 4.4 4.2 3.4*  CL 96* 96* 90*  CO2 40* 41* 39*  GLUCOSE 147* 158* 121*  BUN 16 17 15   CREATININE 0.30* 0.36* 0.47*  CALCIUM 9.5 9.5 9.1   GFR: Estimated Creatinine Clearance: 67 mL/min (by C-G formula based on SCr of 0.47 mg/dL (L)). Liver Function Tests: No results for input(s): AST, ALT, ALKPHOS, BILITOT, PROT, ALBUMIN in the last 168 hours. No results for input(s): LIPASE, AMYLASE in the last 168 hours.  Recent Labs Lab 04/10/16 0846  AMMONIA 26   Coagulation Profile: No results for input(s): INR, PROTIME in the last 168 hours. Cardiac Enzymes: No results for input(s): CKTOTAL, CKMB, CKMBINDEX, TROPONINI in the last 168 hours. BNP (last 3 results) No results for input(s): PROBNP in the last 8760 hours. HbA1C: No results for input(s): HGBA1C in the last 72 hours. CBG:  Recent Labs Lab 04/11/16 2110   GLUCAP 163*   Lipid Profile: No results for input(s): CHOL, HDL, LDLCALC, TRIG, CHOLHDL, LDLDIRECT in the last 72 hours. Thyroid Function Tests: No results for input(s): TSH, T4TOTAL, FREET4, T3FREE, THYROIDAB in the last 72 hours. Anemia Panel: No results for input(s): VITAMINB12, FOLATE, FERRITIN, TIBC, IRON, RETICCTPCT in the last 72 hours. Sepsis Labs: No results for input(s): PROCALCITON, LATICACIDVEN in the last 168 hours.  No results found for this or any previous visit (from the past 240 hour(s)).       Radiology Studies: Dg Chest Port 1 View  Result Date: 04/15/2016 CLINICAL DATA:  Respiratory failure. EXAM: PORTABLE CHEST 1 VIEW COMPARISON:  04/14/2016 FINDINGS: Patient is rotated to the left. Sternotomy wires are unchanged. Lungs are adequately inflated demonstrate stable opacification over the left base likely moderate size effusion with atelectasis. Cannot exclude infection in the left base. Mild prominence of the perihilar markings unchanged. Stable cardiomegaly. Remainder of the exam is unchanged. IMPRESSION: Stable left base opacification compatible with moderate size effusion and associated atelectasis. Cannot exclude infection in the left base. Mild stable cardiomegaly with suggestion mild vascular congestion. Electronically Signed   By: Quillian Quince  Derrel Nip M.D.   On: 04/15/2016 07:13        Scheduled Meds: . aspirin EC  81 mg Oral Daily  . atorvastatin  80 mg Oral Daily  . azithromycin  500 mg Oral Daily  . cefTRIAXone (ROCEPHIN)  IV  1 g Intravenous Q24H  . chlorhexidine  15 mL Mouth Rinse BID  . enoxaparin (LOVENOX) injection  40 mg Subcutaneous Q24H  . feeding supplement (ENSURE ENLIVE)  237 mL Oral TID BM  . finasteride  5 mg Oral Daily  . guaiFENesin  1,200 mg Oral BID  . ipratropium-albuterol  3 mL Nebulization Q4H WA  . mouth rinse  15 mL Mouth Rinse q12n4p  . metoprolol succinate  12.5 mg Oral BID  . mometasone-formoterol  2 puff Inhalation BID  .  nicotine  21 mg Transdermal Daily  . predniSONE  40 mg Oral Q breakfast  . torsemide  40 mg Oral Daily   Continuous Infusions:   LOS: 12 days    Dron Tanna Furry, MD [facility] Pager [phone]  If 7PM-7AM, please contact night-coverage [email or web address] Password [email or web address] 04/16/2016, 2:12 PM

## 2016-04-16 NOTE — Progress Notes (Signed)
Subjective: He has been more confused. No other new complaints but it's difficult to be certain considering his confusion. He denies any chest pain. He says he is not short of breath. He is still coughing some.  Objective: Vital signs in last 24 hours: Temp:  [97.3 F (36.3 C)-98.4 F (36.9 C)] 97.3 F (36.3 C) (12/11 0400) Pulse Rate:  [59-101] 73 (12/11 0700) Resp:  [15-25] 18 (12/11 0600) BP: (98-125)/(51-85) 125/66 (12/11 0700) SpO2:  [73 %-98 %] 92 % (12/11 0755) Weight:  [60.3 kg (132 lb 15 oz)] 60.3 kg (132 lb 15 oz) (12/11 0455) Weight change: 1.4 kg (3 lb 1.4 oz) Last BM Date: 04/14/16  Intake/Output from previous day: 12/10 0701 - 12/11 0700 In: 170 [P.O.:120; IV Piggyback:50] Out: 2000 [Urine:2000]  PHYSICAL EXAM General appearance: alert and Confused. On nasal oxygen. Resp: rhonchi bilaterally Cardio: regular rate and rhythm, S1, S2 normal, no murmur, click, rub or gallop GI: soft, non-tender; bowel sounds normal; no masses,  no organomegaly Extremities: extremities normal, atraumatic, no cyanosis or edema Skin warm and dry. Pupils react. Mucous membranes are moist  Lab Results:  No results found for this or any previous visit (from the past 48 hour(s)).  ABGS No results for input(s): PHART, PO2ART, TCO2, HCO3 in the last 72 hours.  Invalid input(s): PCO2 CULTURES No results found for this or any previous visit (from the past 240 hour(s)). Studies/Results: Dg Chest Port 1 View  Result Date: 04/15/2016 CLINICAL DATA:  Respiratory failure. EXAM: PORTABLE CHEST 1 VIEW COMPARISON:  04/14/2016 FINDINGS: Patient is rotated to the left. Sternotomy wires are unchanged. Lungs are adequately inflated demonstrate stable opacification over the left base likely moderate size effusion with atelectasis. Cannot exclude infection in the left base. Mild prominence of the perihilar markings unchanged. Stable cardiomegaly. Remainder of the exam is unchanged. IMPRESSION: Stable  left base opacification compatible with moderate size effusion and associated atelectasis. Cannot exclude infection in the left base. Mild stable cardiomegaly with suggestion mild vascular congestion. Electronically Signed   By: Marin Olp M.D.   On: 04/15/2016 07:13   Dg Chest Port 1 View  Result Date: 04/14/2016 CLINICAL DATA:  Respiratory failure EXAM: PORTABLE CHEST 1 VIEW COMPARISON:  04/13/2016 FINDINGS: Left pleural effusion appears decreased with slight improvement in lingular aeration. Left lower lobe collapse/ consolidation persist. Background hyperinflation noted. Visualize right lung remains clear. Right nipple shadow noted. Right costophrenic angle is excluded on today's study. No superimposed edema or pneumothorax. Atherosclerosis noted of the aorta. Degenerative changes of the spine. Remote coronary bypass changes. IMPRESSION: Decrease in the left effusion with improved lingular aeration. Left lower lobe collapse/ consolidation persist. Thoracic aortic atherosclerosis Electronically Signed   By: Jerilynn Mages.  Shick M.D.   On: 04/14/2016 09:11    Medications:  Prior to Admission:  Prescriptions Prior to Admission  Medication Sig Dispense Refill Last Dose  . albuterol (PROVENTIL HFA;VENTOLIN HFA) 108 (90 Base) MCG/ACT inhaler Inhale 2 puffs into the lungs every 4 (four) hours as needed for wheezing or shortness of breath.   04/04/2016 at Unknown time  . albuterol (PROVENTIL) (2.5 MG/3ML) 0.083% nebulizer solution Take 3 mLs (2.5 mg total) by nebulization every 4 (four) hours as needed for wheezing or shortness of breath. 75 mL 12 04/04/2016 at Unknown time  . aspirin EC 81 MG tablet Take 81 mg by mouth daily.    04/03/2016 at Unknown time  . atorvastatin (LIPITOR) 80 MG tablet Take 1 tablet (80 mg total) by mouth daily. [place]  tablet 3 04/03/2016 at Unknown time  . benazepril (LOTENSIN) 5 MG tablet Take 1 tablet (5 mg total) by mouth daily. 90 tablet 0 04/03/2016 at Unknown time  .  budesonide-formoterol (SYMBICORT) 160-4.5 MCG/ACT inhaler Inhale 2 puffs into the lungs 2 (two) times daily. 3 Inhaler 3 04/03/2016 at Unknown time  . clonazePAM (KLONOPIN) 0.5 MG tablet TAKE 1 TABLET BY MOUTH TWICE DAILY AS NEEDED FOR ANXIETY 60 tablet 5 04/03/2016 at Unknown time  . finasteride (PROSCAR) 5 MG tablet Take 5 mg by mouth daily.  11 04/03/2016 at Unknown time  . metoprolol succinate (TOPROL-XL) 25 MG 24 hr tablet TAKE 1/2 TABLET BY MOUTH 2 TIMES DAILY. 90 tablet 3 04/03/2016 at 2000  . potassium chloride SA (K-DUR,KLOR-CON) 20 MEQ tablet Take 1 tablet (20 mEq total) by mouth daily. 90 tablet 3 04/03/2016 at Unknown time  . tiotropium (SPIRIVA) 18 MCG inhalation capsule Place 18 mcg into inhaler and inhale daily.   04/03/2016 at Unknown time  . torsemide (DEMADEX) 20 MG tablet Take 2 tablets (40 mg total) by mouth daily. 180 tablet 1 04/03/2016 at Unknown time  . HYDROcodone-acetaminophen (NORCO) 10-325 MG tablet Take 0.5 tablets by mouth every 6 (six) hours as needed for severe pain. 60 tablet 0 unknown  . nitroGLYCERIN (NITROSTAT) 0.4 MG SL tablet Place 1 tablet (0.4 mg total) under the tongue every 5 (five) minutes as needed for chest pain. 100 tablet 0 unknown   Scheduled: . aspirin EC  81 mg Oral Daily  . atorvastatin  80 mg Oral Daily  . azithromycin  500 mg Oral Daily  . cefTRIAXone (ROCEPHIN)  IV  1 g Intravenous Q24H  . chlorhexidine  15 mL Mouth Rinse BID  . enoxaparin (LOVENOX) injection  40 mg Subcutaneous Q24H  . feeding supplement (ENSURE ENLIVE)  237 mL Oral TID BM  . finasteride  5 mg Oral Daily  . guaiFENesin  1,200 mg Oral BID  . ipratropium-albuterol  3 mL Nebulization Q4H WA  . mouth rinse  15 mL Mouth Rinse q12n4p  . metoprolol succinate  12.5 mg Oral BID  . mometasone-formoterol  2 puff Inhalation BID  . nicotine  21 mg Transdermal Daily  . predniSONE  40 mg Oral Q breakfast  . torsemide  40 mg Oral Daily   Continuous:  [id number] **OR**  acetaminophen, albuterol, HYDROcodone-acetaminophen, LORazepam, ondansetron **OR** ondansetron (ZOFRAN) IV  Assesment: He was admitted with community-acquired pneumonia, sepsis, acute on chronic hypoxic respiratory failure. He had white out of the left lung about 4 days ago but has made impressive progress with that. However he is still having trouble with encephalopathy. Principal Problem:   Sepsis ([place]) Active Problems:   Essential hypertension   CAD (coronary artery disease)   Anxiety   COPD with acute exacerbation (HCC)   Chronic pain syndrome   Acute on chronic respiratory failure with hypoxia (HCC)   Chronic systolic CHF (congestive heart failure) (HCC)   Hypotension   CAP (community acquired pneumonia)   Anemia   Thrombocytopenia (HCC)   Fever   Elevated troponin   Acute encephalopathy   Palliative care encounter   Goals of care, counseling/discussion   DNR (do not resuscitate) discussion    Plan: Continue treatments.    LOS: 12 days   Jacorion Klem L 04/16/2016, 8:03 AM

## 2016-04-16 NOTE — Progress Notes (Signed)
PT Cancellation Note  Patient Details Name: Tommy Turner MRN: [id number] DOB: November 05, 1940   Cancelled Treatment:    Reason Eval/Treat Not Completed: Patient declined, no reason specified (Pt becomming irritated with PT, but adamantly refused to attempt any mobility at this time.  Will check back as schedule allows. )   Beth Kayan Blissett, PT, DPT X: [phone]

## 2016-04-17 ENCOUNTER — Telehealth: Payer: Self-pay | Admitting: Family Medicine

## 2016-04-17 ENCOUNTER — Inpatient Hospital Stay (HOSPITAL_COMMUNITY): Payer: 59

## 2016-04-17 MED ORDER — PREDNISONE 10 MG (21) PO TBPK: ORAL_TABLET | ORAL | 0 refills | Status: DC

## 2016-04-17 MED ORDER — GUAIFENESIN ER 600 MG PO TB12: 1200.0000 mg | ORAL_TABLET | Freq: Two times a day (BID) | ORAL | 0 refills | Status: DC | PRN

## 2016-04-17 NOTE — Care Management Note (Signed)
Case Management Note  Patient Details  Name: ZAMEER AWWAD MRN: [id number] Date of Birth: 25-Oct-1940  Expected Discharge Date:     04/17/2016             Expected Discharge Plan:  [place]  In-House Referral:  NA  Discharge planning Services  CM Consult  Post Acute Care Choice:  Durable Medical Equipment, Home Health Choice offered to:  Patient  DME Arranged:  Wheelchair manual DME Agency:  [place]:  RN, PT [facility] Agency:  [place]  Status of Service:  Completed, signed off  If discussed at [place] of Stay Meetings, dates discussed:  04/17/2016  Additional Comments: Pt discharged home today with [facility] services through [facility]. Pt's wife at bedside, she is aware that [facility] has 48hrs to make first visit. Pt has port oxygen for transport home. Pt's WC delivered already. Romualdo Bolk, of [facility], aware of DC today and will obtain pt info from chart.   Sherald Barge, RN 04/17/2016, 4:01 PM

## 2016-04-17 NOTE — Care Management Important Message (Signed)
Important Message  Patient Details  Name: Tommy Turner MRN: [id number] Date of Birth: 10/04/1940   Medicare Important Message Given:  Yes    Sherald Barge, RN 04/17/2016, 4:00 PM

## 2016-04-17 NOTE — Discharge Summary (Signed)
Physician Discharge Summary  Tommy Turner [id number] DOB: 02/01/1941 DOA: 04/04/2016  PCP: Sallee Lange, MD  Admit date: 04/04/2016 Discharge date: 04/17/2016  Admitted From:Home Disposition: Home with home care services including PT, RN, home health aide, social worker.   Recommendations for Outpatient Follow-up:  1. Follow up with PCP in 1-2 weeks 2. Please obtain BMP/CBC in one week   Home Health: Yes Equipment/Devices: Oxygen Discharge Condition: Stable CODE STATUS: DO NOT RESUSCITATE Diet recommendation: Heart healthy  Brief/Interim Summary:75 y.o. male with medical history significant of AAA, anxiety, CAD, HTN, HLD, chronic systolic CHF EF 0000000, COPD oxygen dependent (2L), MI, and tobacco abuse presented with complaint of gradually worsening shortness of breath. He was admitted to the hospital for sepsis related to pneumonia as well as COPD exacerbation. Initially he required bipap, and later weaned off. Hospital course has been complicated by development of confusion and agitation.  Evaluated by pulmonologist and palliative care service. Patient is DO NOT RESUSCITATE.  # Sepsis due to community acquired pneumonia: -Complicated 10 days of ceftriaxone and azithromycin. Initial chest x-ray with left lung opacity. Treated with bronchodilator, Mucomyst, chest percussion flutter valve with improvement in chest x-ray finding. Currently oxygen requirement improving. Off BiPAP. Requiring 3 L of Oxygen with acceptable oxygen saturation. Discussed with the patient and patient's wife in detail. Decline rehabilitation or skilled nursing facility. Patient wants to go home with his wife. Discussed with the case manager team. Patient will be discharged home with home care services including physical therapy, visiting nurse, home health aide and social worker consult.  #Acute on chronic hypoxic respiratory failure: Due to pneumonia and possible COPD exacerbation: -Complicated antibiotics  course, discharged with the tapering dose of prednisone. Advised to follow-up with PCP and pulmonologist.  # History of ischemic cardiomyopathy with chronic systolic heart failure:LVEF 20% on echo 04/09/16 which is stable. Has refused ICD in past.  -Palliative consult evaluation ongoing. Patient is currently DO NOT RESUSCITATE.  #Mild elevation in troponin (0.07) likely demand ischemia in the setting of sepsis. Patient is on aspirin and statin.  # PVCs/brief NSVT. continue beta blocker  # Acute encephalopathy metabolic versus medication related: Confusion is improving. PCO2 is better. Ammonia not elevated. Patient does not have focal neurological deficit.  #Diarrhea: Stool C. difficile negative. Diarrhea improved. Leukocytosis likely related with the steroid.  #Essential hypertension: Blood pressure borderline low. Holding benazepril on discharge because of low blood pressure. Continue metoprolol and Demadex with parameters. Monitor blood pressure.   #Hyperlipidemia: Continue statin.  #Mild thrombocytopenia, chronic: No sign of bleeding. Monitor.  Patient is overall improving. Requiring only 3 L hypoxia and to maintain saturation. Chest x-ray is improving. Evaluated by palliative care services and discussed about goals of care and hospice care. Patient is willing to go home with his wife. I consulted outpatient palliative care and discussed with the family to follow up with PCP.  Discharge Diagnoses:  Principal Problem:   Sepsis ([place]) Active Problems:   Essential hypertension   CAD (coronary artery disease)   Anxiety   COPD with acute exacerbation (HCC)   Chronic pain syndrome   Acute on chronic respiratory failure with hypoxia (HCC)   Chronic systolic CHF (congestive heart failure) (HCC)   Hypotension   CAP (community acquired pneumonia)   Anemia   Thrombocytopenia (HCC)   Fever   Elevated troponin   Acute encephalopathy   Palliative care encounter   Goals of care,  counseling/discussion   DNR (do not resuscitate) discussion    Discharge  Instructions  Discharge Instructions    Amb Referral to Palliative Care    Complete by:  As directed    Call MD for:  difficulty breathing, headache or visual disturbances    Complete by:  As directed    Call MD for:  extreme fatigue    Complete by:  As directed    Call MD for:  hives    Complete by:  As directed    Call MD for:  persistant dizziness or light-headedness    Complete by:  As directed    Call MD for:  persistant nausea and vomiting    Complete by:  As directed    Call MD for:  severe uncontrolled pain    Complete by:  As directed    Call MD for:  temperature >100.4    Complete by:  As directed    Diet - low sodium heart healthy    Complete by:  As directed    Discharge instructions    Complete by:  As directed    Please follow up with your family doctor, pulmonologist. Please also follow up with palliative care and outpatient.   For home use only DME oxygen    Complete by:  As directed    Mode or (Route):  Nasal cannula   Liters per Minute:  3   Frequency:  Continuous (stationary and portable oxygen unit needed)   Oxygen conserving device:  Yes   Oxygen delivery system:  Gas   Increase activity slowly    Complete by:  As directed        Medication List    STOP taking these medications   benazepril 5 MG tablet Commonly known as:  LOTENSIN     TAKE these medications   albuterol 108 (90 Base) MCG/ACT inhaler Commonly known as:  PROVENTIL HFA;VENTOLIN HFA Inhale 2 puffs into the lungs every 4 (four) hours as needed for wheezing or shortness of breath.   albuterol (2.5 MG/3ML) 0.083% nebulizer solution Commonly known as:  PROVENTIL Take 3 mLs (2.5 mg total) by nebulization every 4 (four) hours as needed for wheezing or shortness of breath.   aspirin EC 81 MG tablet Take 81 mg by mouth daily.   atorvastatin 80 MG tablet Commonly known as:  LIPITOR Take 1 tablet (80 mg total)  by mouth daily.   budesonide-formoterol 160-4.5 MCG/ACT inhaler Commonly known as:  SYMBICORT Inhale 2 puffs into the lungs 2 (two) times daily.   clonazePAM 0.5 MG tablet Commonly known as:  KLONOPIN TAKE 1 TABLET BY MOUTH TWICE DAILY AS NEEDED FOR ANXIETY   finasteride 5 MG tablet Commonly known as:  PROSCAR Take 5 mg by mouth daily.   guaiFENesin 600 MG 12 hr tablet Commonly known as:  MUCINEX Take 2 tablets (1,200 mg total) by mouth 2 (two) times daily as needed.   HYDROcodone-acetaminophen 10-325 MG tablet Commonly known as:  NORCO Take 0.5 tablets by mouth every 6 (six) hours as needed for severe pain.   metoprolol succinate 25 MG 24 hr tablet Commonly known as:  TOPROL-XL TAKE 1/2 TABLET BY MOUTH 2 TIMES DAILY.   nitroGLYCERIN 0.4 MG SL tablet Commonly known as:  NITROSTAT Place 1 tablet (0.4 mg total) under the tongue every 5 (five) minutes as needed for chest pain.   potassium chloride SA 20 MEQ tablet Commonly known as:  K-DUR,KLOR-CON Take 1 tablet (20 mEq total) by mouth daily.   predniSONE 10 MG (21) Tbpk tablet Commonly known as:  STERAPRED  UNI-PAK 21 TAB Take 30 mg for 4 days, 20 mg for 3 days, 10 mg for 3 days and then stop   tiotropium 18 MCG inhalation capsule Commonly known as:  SPIRIVA Place 18 mcg into inhaler and inhale daily.   torsemide 20 MG tablet Commonly known as:  DEMADEX Take 2 tablets (40 mg total) by mouth daily.            Durable Medical Equipment        Start     Ordered   04/17/16 0000  For home use only DME oxygen    Question Answer Comment  Mode or (Route) Nasal cannula   Liters per Minute 3   Frequency Continuous (stationary and portable oxygen unit needed)   Oxygen conserving device Yes   Oxygen delivery system Gas      04/17/16 1145   04/12/16 0850  For home use only DME standard manual wheelchair with seat cushion  Once    Comments:  Patient suffers from COPD which impairs their ability to perform daily  activities like ambulating in the home.  A walker will not resolve  issue with performing activities of daily living. A wheelchair will allow patient to safely perform daily activities. Patient can safely propel the wheelchair in the home or has a caregiver who can provide assistance.  Accessories: elevating leg rests (ELRs), wheel locks, extensions and anti-tippers.   04/12/16 0850     Follow-up Information    [facility].   Contact information: [place] [address] [phone]        Sallee Lange, MD. Schedule an appointment as soon as possible for a visit in 1 week(s).   Specialty:  Family Medicine Contact information: [address] [place] [address] [phone]        HAWKINS,EDWARD L, MD. Schedule an appointment as soon as possible for a visit in 1 week(s).   Specialty:  Pulmonary Disease Contact information: [place] [address] [phone]          No Known Allergies  Consultations: Pulmonologist, palliative care  Procedures/Studies: BiPAP  Subjective: The atrium was seen and examined at bedside. Patient reported better. Denied shortness of breath, chest pain, nausea, vomiting, headache. Dizziness. Has chronic weakness. Reports diarrhea is improving.   Discharge Exam: Vitals:   04/17/16 0900 04/17/16 1100  BP: 106/67 (!) 106/52  Pulse:  76  Resp:  12  Temp:  97.9 F (36.6 C)   Vitals:   04/17/16 0832 04/17/16 0839 04/17/16 0900 04/17/16 1100  BP:   106/67 (!) 106/52  Pulse:    76  Resp:    12  Temp:    97.9 F (36.6 C)  TempSrc:    Oral  SpO2: 94% 99%  98%  Weight:      Height:        General: Elderly looking male lying on bed, waiting 3 L of oxygen. Not in distress  Cardiovascular: RRR, S1/S2 +, no rubs, no gallops. No pedal edema Respiratory: CTA bilaterally, no wheezing, no rhonchi Abdominal: Soft, NT, ND, bowel sounds + Extremities:  no edema, no cyanosis Neurologic: Alert awake, nonfocal. Muscle strength 5 over 5 in all extremities, symmetric   The results of significant diagnostics from this hospitalization (including imaging, microbiology, ancillary and laboratory) are listed below for reference.     Microbiology: Recent Results (from the past 240 hour(s))  C difficile quick scan w  PCR reflex     Status: None   Collection Time: 04/16/16 12:51 PM  Result Value Ref Range Status   C Diff antigen NEGATIVE NEGATIVE Final   C Diff toxin NEGATIVE NEGATIVE Final   C Diff interpretation No C. difficile detected.  Final     Labs: BNP (last 3 results)  Recent Labs  05/02/15 0845 05/16/15 0730  BNP 580.0* [id number]   Basic Metabolic Panel:  Recent Labs Lab 04/11/16 0356 04/16/16 1002  NA 141 137  K 4.2 3.4*  CL 96* 90*  CO2 41* 39*  GLUCOSE 158* 121*  BUN 17 15  CREATININE 0.36* 0.47*  CALCIUM 9.5 9.1   Liver Function Tests: No results for input(s): AST, ALT, ALKPHOS, BILITOT, PROT, ALBUMIN in the last 168 hours. No results for input(s): LIPASE, AMYLASE in the last 168 hours.  Recent Labs Lab 04/16/16 1450  AMMONIA 18   CBC:  Recent Labs Lab 04/11/16 0356 04/16/16 1002  WBC 10.3 21.8*  HGB 14.0 14.4  HCT 43.4 45.2  MCV 96.9 97.4  PLT 144* 133*   Cardiac Enzymes: No results for input(s): CKTOTAL, CKMB, CKMBINDEX, TROPONINI in the last 168 hours. BNP: Invalid input(s): POCBNP CBG:  Recent Labs Lab 04/11/16 2110  GLUCAP 163*   D-Dimer No results for input(s): DDIMER in the last 72 hours. Hgb A1c No results for input(s): HGBA1C in the last 72 hours. Lipid Profile No results for input(s): CHOL, HDL, LDLCALC, TRIG, CHOLHDL, LDLDIRECT in the last 72 hours. Thyroid function studies No results for input(s): TSH, T4TOTAL, T3FREE, THYROIDAB in the last 72 hours.  Invalid input(s): FREET3 Anemia work up No results for input(s): VITAMINB12, FOLATE, FERRITIN, TIBC, IRON, RETICCTPCT in the  last 72 hours. Urinalysis    Component Value Date/Time   COLORURINE YELLOW 04/10/2016 1050   APPEARANCEUR CLEAR 04/10/2016 1050   LABSPEC 1.016 04/10/2016 1050   PHURINE 7.0 04/10/2016 1050   GLUCOSEU NEGATIVE 04/10/2016 1050   HGBUR NEGATIVE 04/10/2016 [place] 04/10/2016 1050   KETONESUR NEGATIVE 04/10/2016 1050   PROTEINUR NEGATIVE 04/10/2016 1050   UROBILINOGEN 1.0 09/10/2014 0919   NITRITE NEGATIVE 04/10/2016 1050   LEUKOCYTESUR NEGATIVE 04/10/2016 1050   Sepsis Labs Invalid input(s): PROCALCITONIN,  WBC,  LACTICIDVEN Microbiology Recent Results (from the past 240 hour(s))  C difficile quick scan w PCR reflex     Status: None   Collection Time: 04/16/16 12:51 PM  Result Value Ref Range Status   C Diff antigen NEGATIVE NEGATIVE Final   C Diff toxin NEGATIVE NEGATIVE Final   C Diff interpretation No C. difficile detected.  Final     Time coordinating discharge: Over 30 minutes  SIGNED:   Rosita Fire, MD  [facility] 04/17/2016, 11:45 AM  If 7PM-7AM, please contact night-coverage [email or web address] Password [email or web address]

## 2016-04-17 NOTE — Telephone Encounter (Signed)
This patient was recently released from the hospital. It is recommended that he follow-up with [place] within 1 week. Please call the patient's family. #1 make sure that he doesn't need anything currently otherwise set him up for this coming Monday for a follow-up office visit. We will need to do a metabolic 7 and CBC after that visit.

## 2016-04-17 NOTE — Consult Note (Signed)
   [facility] CM Inpatient Consult   04/17/2016  Tommy Turner [birth year] [id number]   Hospital liaison following patient's progress via chart after receiving a referral from [facility] telephonic case manager. According to chart patient has been confused and liaison has been unable to telephonically reach out to patient. Reached out to inpatient case manager via email to make her aware patient is eligible for [facility] services through [facility] and to inquire if reaching out to patient's spouse would be appropriate. Have made refering [facility] telephonic case manager aware that patient is being followed up on. [facility] services do not take the place of any outpatient services arranged by inpatient case management.For questions please contact:   Saleah Rishel RN, [place] [facility] Liaison  [phone]) Business Mobile [phone]) Toll free office    For questions please contact:   Megann Easterwood RN, [place] [facility] Liaison  [phone]) [place] [phone]) Toll free office

## 2016-04-17 NOTE — Progress Notes (Signed)
Subjective: He remains confused. His breathing seems to be doing okay. No other new problems noted.  Objective: Vital signs in last 24 hours: Temp:  [97.7 F (36.5 C)-98.9 F (37.2 C)] 97.7 F (36.5 C) (12/12 0759) Pulse Rate:  [54-111] 60 (12/12 0500) Resp:  [15-25] 15 (12/12 0500) BP: (95-157)/(54-94) 104/59 (12/12 0500) SpO2:  [81 %-99 %] 81 % (12/12 0500) Weight:  [54.8 kg (120 lb 13 oz)] 54.8 kg (120 lb 13 oz) (12/12 0500) Weight change: -5.5 kg (-12 lb 2 oz) Last BM Date: 04/16/16  Intake/Output from previous day: 12/11 0701 - 12/12 0700 In: 720 [P.O.:720] Out: 4950 [Urine:4950]  PHYSICAL EXAM General appearance: alert, cooperative and Very confused Resp: clear to auscultation bilaterally Cardio: regular rate and rhythm, S1, S2 normal, no murmur, click, rub or gallop GI: soft, non-tender; bowel sounds normal; no masses,  no organomegaly Extremities: extremities normal, atraumatic, no cyanosis or edema Skin warm and dry. Mucous membranes are moist  Lab Results:  Results for orders placed or performed during the hospital encounter of 04/04/16 (from the past 48 hour(s))  CBC     Status: Abnormal   Collection Time: 04/16/16 10:02 AM  Result Value Ref Range   WBC 21.8 (H) 4.0 - 10.5 K/uL   RBC 4.64 4.22 - 5.81 MIL/uL   Hemoglobin 14.4 13.0 - 17.0 g/dL   HCT 45.2 39.0 - 52.0 %   MCV 97.4 78.0 - 100.0 fL   MCH 31.0 26.0 - 34.0 pg   MCHC 31.9 30.0 - 36.0 g/dL   RDW 12.9 11.5 - 15.5 %   Platelets 133 (L) 150 - 400 K/uL  Basic metabolic panel     Status: Abnormal   Collection Time: 04/16/16 10:02 AM  Result Value Ref Range   Sodium 137 135 - 145 mmol/L   Potassium 3.4 (L) 3.5 - 5.1 mmol/L   Chloride 90 (L) 101 - 111 mmol/L   CO2 39 (H) 22 - 32 mmol/L   Glucose, Bld 121 (H) 65 - 99 mg/dL   BUN 15 6 - 20 mg/dL   Creatinine, Ser 0.47 (L) 0.61 - 1.24 mg/dL   Calcium 9.1 8.9 - 10.3 mg/dL   GFR calc non Af Amer >60 >60 mL/min   GFR calc Af Amer >60 >60 mL/min     Comment: (NOTE) The eGFR has been calculated using the CKD EPI equation. This calculation has not been validated in all clinical situations. eGFR's persistently <60 mL/min signify possible Chronic Kidney Disease.    Anion gap 8 5 - 15  C difficile quick scan w PCR reflex     Status: None   Collection Time: 04/16/16 12:51 PM  Result Value Ref Range   C Diff antigen NEGATIVE NEGATIVE   C Diff toxin NEGATIVE NEGATIVE   C Diff interpretation No C. difficile detected.   Blood gas, arterial     Status: Abnormal   Collection Time: 04/16/16  1:15 PM  Result Value Ref Range   O2 Content 3.0 L/min   Delivery systems HI FLOW NASAL CANNULA    pH, Arterial 7.500 (H) 7.350 - 7.450   pCO2 arterial 51.0 (H) 32.0 - 48.0 mmHg   pO2, Arterial 66.1 (L) 83.0 - 108.0 mmHg   Bicarbonate 37.7 (H) 20.0 - 28.0 mmol/L   Acid-Base Excess 15.0 (H) 0.0 - 2.0 mmol/L   O2 Saturation 92.3 %   Patient temperature 37.0    Collection site RIGHT RADIAL    Drawn by [id number]  Sample type ARTERIAL DRAW    Allens test (pass/fail) PASS PASS  Ammonia     Status: None   Collection Time: 04/16/16  2:50 PM  Result Value Ref Range   Ammonia 18 9 - 35 umol/L    ABGS  Recent Labs  04/16/16 1315  PHART 7.500*  PO2ART 66.1*  HCO3 37.7*   CULTURES Recent Results (from the past 240 hour(s))  C difficile quick scan w PCR reflex     Status: None   Collection Time: 04/16/16 12:51 PM  Result Value Ref Range Status   C Diff antigen NEGATIVE NEGATIVE Final   C Diff toxin NEGATIVE NEGATIVE Final   C Diff interpretation No C. difficile detected.  Final   Studies/Results: No results found.  Medications:  Prior to Admission:  Prescriptions Prior to Admission  Medication Sig Dispense Refill Last Dose  . albuterol (PROVENTIL HFA;VENTOLIN HFA) 108 (90 Base) MCG/ACT inhaler Inhale 2 puffs into the lungs every 4 (four) hours as needed for wheezing or shortness of breath.   04/04/2016 at Unknown time  . albuterol  (PROVENTIL) (2.5 MG/3ML) 0.083% nebulizer solution Take 3 mLs (2.5 mg total) by nebulization every 4 (four) hours as needed for wheezing or shortness of breath. 75 mL 12 04/04/2016 at Unknown time  . aspirin EC 81 MG tablet Take 81 mg by mouth daily.    04/03/2016 at Unknown time  . atorvastatin (LIPITOR) 80 MG tablet Take 1 tablet (80 mg total) by mouth daily. 90 tablet 3 04/03/2016 at Unknown time  . benazepril (LOTENSIN) 5 MG tablet Take 1 tablet (5 mg total) by mouth daily. 90 tablet 0 04/03/2016 at Unknown time  . budesonide-formoterol (SYMBICORT) 160-4.5 MCG/ACT inhaler Inhale 2 puffs into the lungs 2 (two) times daily. 3 Inhaler 3 04/03/2016 at Unknown time  . clonazePAM (KLONOPIN) 0.5 MG tablet TAKE 1 TABLET BY MOUTH TWICE DAILY AS NEEDED FOR ANXIETY 60 tablet 5 04/03/2016 at Unknown time  . finasteride (PROSCAR) 5 MG tablet Take 5 mg by mouth daily.  11 04/03/2016 at Unknown time  . metoprolol succinate (TOPROL-XL) 25 MG 24 hr tablet TAKE 1/2 TABLET BY MOUTH 2 TIMES DAILY. 90 tablet 3 04/03/2016 at 2000  . potassium chloride SA (K-DUR,KLOR-CON) 20 MEQ tablet Take 1 tablet (20 mEq total) by mouth daily. 90 tablet 3 04/03/2016 at Unknown time  . tiotropium (SPIRIVA) 18 MCG inhalation capsule Place 18 mcg into inhaler and inhale daily.   04/03/2016 at Unknown time  . torsemide (DEMADEX) 20 MG tablet Take 2 tablets (40 mg total) by mouth daily. 180 tablet 1 04/03/2016 at Unknown time  . HYDROcodone-acetaminophen (NORCO) 10-325 MG tablet Take 0.5 tablets by mouth every 6 (six) hours as needed for severe pain. 60 tablet 0 unknown  . nitroGLYCERIN (NITROSTAT) 0.4 MG SL tablet Place 1 tablet (0.4 mg total) under the tongue every 5 (five) minutes as needed for chest pain. 100 tablet 0 unknown   Scheduled: . aspirin EC  81 mg Oral Daily  . atorvastatin  80 mg Oral Daily  . azithromycin  500 mg Oral Daily  . cefTRIAXone (ROCEPHIN)  IV  1 g Intravenous Q24H  . chlorhexidine  15 mL Mouth Rinse BID  .  enoxaparin (LOVENOX) injection  40 mg Subcutaneous Q24H  . feeding supplement (ENSURE ENLIVE)  237 mL Oral TID BM  . finasteride  5 mg Oral Daily  . guaiFENesin  1,200 mg Oral BID  . ipratropium-albuterol  3 mL Nebulization Q6H WA  .  mouth rinse  15 mL Mouth Rinse q12n4p  . metoprolol succinate  12.5 mg Oral BID  . mometasone-formoterol  2 puff Inhalation BID  . nicotine  21 mg Transdermal Daily  . predniSONE  40 mg Oral Q breakfast  . torsemide  40 mg Oral Daily   Continuous:  QPR:[id number] **OR** acetaminophen, albuterol, HYDROcodone-acetaminophen, LORazepam, ondansetron **OR** ondansetron (ZOFRAN) IV  Assesment: He was admitted with community-acquired pneumonia, sepsis, COPD exacerbation and acute on chronic hypoxic/hypercapnic respiratory failure. He had acute encephalopathy before but that seemed to clear and then about 48 hours so ago he became much more confused again. He had "white out" of his left lung but that has resolved. Chest x-ray today which I personally reviewed shows perhaps some effusion on the left in the base but his lung is now reexpanded. His wife says that he had some confusion at baseline Principal Problem:   Sepsis ([place]) Active Problems:   Essential hypertension   CAD (coronary artery disease)   Anxiety   COPD with acute exacerbation (HCC)   Chronic pain syndrome   Acute on chronic respiratory failure with hypoxia (HCC)   Chronic systolic CHF (congestive heart failure) (HCC)   Hypotension   CAP (community acquired pneumonia)   Anemia   Thrombocytopenia (HCC)   Fever   Elevated troponin   Acute encephalopathy   Palliative care encounter   Goals of care, counseling/discussion   DNR (do not resuscitate) discussion    Plan: Continue current treatments. He is much better from a pulmonary point of view and I will follow more peripherally now I will be happy to be more involved as needed    LOS: 13 days   Ernesta Trabert L 04/17/2016, 8:00 AM

## 2016-04-17 NOTE — Consult Note (Signed)
   [facility] CM Inpatient Consult   04/17/2016  Tommy Turner Jun 28, 1940 [id number]   Received word back from inpatient case manager that patient was discharging today and it would be appropriate to contact spouse. Liaison called patient's room but he had already left, attempted to contact patient at home but there was no answer. Will attempt to contact patient at home later today.   Rutherford Limerick RN, [place] [facility] Liaison  [phone]) Business Mobile [phone]) Toll free office

## 2016-04-18 NOTE — Telephone Encounter (Signed)
Discussed with pt's wife. She states he does not need anything at this time. Transferred to front to schedule office visit.

## 2016-04-20 ENCOUNTER — Other Ambulatory Visit: Payer: Self-pay | Admitting: *Deleted

## 2016-04-20 NOTE — Patient Outreach (Signed)
Hospital liaison unable to reach [facility] member while recently inpatient, communication order sent for telephonic case manager to outreach for services. Discussed case with telephonic case manager, she is aware of patient's status. [facility] Care Management services do not replace any services set up by the inpatient case manager. For questions or concerns please contact:  Vikki Gains RN, [place] [facility] Liaison  [phone]) Business Mobile [phone]) Toll free office

## 2016-04-22 DIAGNOSIS — J9621 Acute and chronic respiratory failure with hypoxia: Secondary | ICD-10-CM | POA: Diagnosis not present

## 2016-04-22 DIAGNOSIS — J189 Pneumonia, unspecified organism: Secondary | ICD-10-CM | POA: Diagnosis not present

## 2016-04-22 DIAGNOSIS — I110 Hypertensive heart disease with heart failure: Secondary | ICD-10-CM | POA: Diagnosis not present

## 2016-04-22 DIAGNOSIS — I5032 Chronic diastolic (congestive) heart failure: Secondary | ICD-10-CM | POA: Diagnosis not present

## 2016-04-22 DIAGNOSIS — I2510 Atherosclerotic heart disease of native coronary artery without angina pectoris: Secondary | ICD-10-CM | POA: Diagnosis not present

## 2016-04-22 DIAGNOSIS — F419 Anxiety disorder, unspecified: Secondary | ICD-10-CM | POA: Diagnosis not present

## 2016-04-22 DIAGNOSIS — Z87891 Personal history of nicotine dependence: Secondary | ICD-10-CM | POA: Diagnosis not present

## 2016-04-22 DIAGNOSIS — Z7982 Long term (current) use of aspirin: Secondary | ICD-10-CM | POA: Diagnosis not present

## 2016-04-22 DIAGNOSIS — J440 Chronic obstructive pulmonary disease with acute lower respiratory infection: Secondary | ICD-10-CM | POA: Diagnosis not present

## 2016-04-22 DIAGNOSIS — Z9981 Dependence on supplemental oxygen: Secondary | ICD-10-CM | POA: Diagnosis not present

## 2016-04-22 DIAGNOSIS — J441 Chronic obstructive pulmonary disease with (acute) exacerbation: Secondary | ICD-10-CM | POA: Diagnosis not present

## 2016-04-23 ENCOUNTER — Encounter: Payer: Self-pay | Admitting: Family Medicine

## 2016-04-23 ENCOUNTER — Other Ambulatory Visit: Payer: Self-pay | Admitting: *Deleted

## 2016-04-23 ENCOUNTER — Ambulatory Visit (INDEPENDENT_AMBULATORY_CARE_PROVIDER_SITE_OTHER): Payer: 59 | Admitting: Family Medicine

## 2016-04-23 VITALS — BP 92/68 | Wt 124.0 lb

## 2016-04-23 DIAGNOSIS — J431 Panlobular emphysema: Secondary | ICD-10-CM | POA: Diagnosis not present

## 2016-04-23 DIAGNOSIS — H6121 Impacted cerumen, right ear: Secondary | ICD-10-CM

## 2016-04-23 DIAGNOSIS — I10 Essential (primary) hypertension: Secondary | ICD-10-CM

## 2016-04-23 DIAGNOSIS — D72820 Lymphocytosis (symptomatic): Secondary | ICD-10-CM

## 2016-04-23 NOTE — Progress Notes (Signed)
   Subjective:    Patient ID: Tommy Turner, male    DOB: 28-May-1940, 75 y.o.   MRN: [id number]  HPI  Patient in office today for AP hospital follow-up.  Wife [name] states patient was in hospital for 2 weeks due to pneumonia.Long discussion was held going over the results of his x-rays lab work in his hospital stay this patient did suffer with moderate delirium while in the hospital. The patient also is having significant fatigue tiredness he is also having mild ataxia along with weakness and poor appetite. Patient is DO NOT RESUSCITATE.  Review of Systems  Constitutional: Negative for activity change, appetite change and fatigue.  HENT: Negative for congestion and rhinorrhea.   Respiratory: Positive for cough and shortness of breath (with activity).   Cardiovascular: Positive for chest pain.  Endocrine: Negative for polydipsia and polyphagia.  Genitourinary: Negative for difficulty urinating.  Musculoskeletal: Positive for back pain and gait problem.  Neurological: Negative for dizziness, weakness and numbness.  Psychiatric/Behavioral: Negative for confusion.       Objective:   Physical Exam  Constitutional: He appears well-nourished. No distress.  HENT:  Right cerumen impaction  Cardiovascular: Normal rate, regular rhythm and normal heart sounds.   No murmur heard. Pulmonary/Chest: Effort normal and breath sounds normal. No respiratory distress.  Musculoskeletal: He exhibits no edema.  Lymphadenopathy:    He has no cervical adenopathy.  Neurological: He is alert.  Psychiatric: His behavior is normal.  Vitals reviewed.   Transitional care review was done      Assessment & Plan:  25 minutes was spent with this patient discussing his hospitalization treatment measures and the best way to avoid being back in the hospital  Palliative measures were discussed.  Patient does have cerumen impaction right ear referral to ENT  Follow-up lab work on metabolic 7 and CBC due to  leukocytosis and diuretic use

## 2016-04-23 NOTE — Patient Outreach (Addendum)
[facility]) Care Management  04/23/2016  CRUSOE OATLEY 1940-10-20 [id number]  Subjective: Telephone call to patient's home number, no answer, left HIPAA compliant voicemail message, and requested call back.   Objective: Per chart review: Patient hospitalized  04/04/16 - 04/17/16 for sepsis due to community acquired pneumonia,  Acute on chronic hypoxic respiratory failure (Due to pneumonia and possible COPD exacerbation),  Acute encephalopathy metabolic versus medication related.    Patient also has a history of hypertension, hyperlipidemia, Abdominal aortic aneurysm, and Chronic systolic CHF (congestive heart failure).   [facility] Care Management high cost screening completed on 03/28/16. Had ED visit on 11/27/15 for urinary retention.   Patient hospitalized  11/24/15 - 11/25/15 for BPH (benign prostatic hypertrophy) with urinary retention.   Status post Transurethral resection of the prostate and removal of bladder stones on 11/24/15.   Had ED visit on 09/08/15 for urinary retention.  Assessment: Received UMR Transition of care referral on 04/20/16.   Transition of care pending patient outreach.   Plan: RNCM will call patient for 2nd telephone outreach attempt, transition of care follow up, within 10 business days, if no return call.   Ouida Abeyta H. Annia Friendly, BSN, [place] Management [facility] Telephonic CM Phone: [phone] Fax: [phone]

## 2016-04-24 ENCOUNTER — Encounter: Payer: Self-pay | Admitting: Family Medicine

## 2016-04-24 ENCOUNTER — Other Ambulatory Visit: Payer: Self-pay | Admitting: *Deleted

## 2016-04-24 NOTE — Patient Outreach (Addendum)
[facility]) Care Management  04/24/2016  Tommy Turner [birth year] [id number]   Subjective: Telephone call to patient's home number, no answer, left HIPAA compliant voicemail message, and requested call back.   Objective: Per chart review: Patient hospitalized  04/04/16 - 04/17/16 for sepsis due to community acquired pneumonia,  Acute on chronic hypoxic respiratory failure (Due to pneumonia and possible COPD exacerbation),  Acute encephalopathy metabolic versus medication related.    Patient also has a history of hypertension, hyperlipidemia, Abdominal aortic aneurysm, and Chronic systolic CHF (congestive heart failure).   [facility] Care Management high cost screening completed on 03/28/16. Had ED visit on 11/27/15 for urinary retention.   Patient hospitalized  11/24/15 - 11/25/15 for BPH (benign prostatic hypertrophy) with urinary retention. Status post Transurethral resection of the prostate and removal of bladder stones on 11/24/15.   Had ED visit on 09/08/15 for urinary retention.  Assessment: Received UMR Transition of care referral on 04/20/16. Transition of care pending patient outreach.   Plan: RNCM will call patient for 3rd telephone outreach attempt, transition of care follow up, within 10 business days, if no return call.   Tommy Turner H. Annia Friendly, BSN, [place] [name] Telephonic CM Phone: [phone] Fax: [phone]

## 2016-04-25 ENCOUNTER — Encounter: Payer: Self-pay | Admitting: *Deleted

## 2016-04-25 ENCOUNTER — Other Ambulatory Visit: Payer: Self-pay | Admitting: *Deleted

## 2016-04-25 NOTE — Patient Outreach (Addendum)
[name]) Care Management  04/25/2016  Tommy Turner July 22, 1940 [id number]   Subjective: Telephone call to patient's home number, no answer, unable to leave voicemail, and message states mailbox is full.  Telephone call to patient's mobile, no answer, and unable to leave message.    Objective: Per chart review: Patient hospitalized 04/04/16 - 04/17/16 for sepsis due to community acquired pneumonia, Acute on chronic hypoxic respiratory failure (Due to pneumonia and possible COPD exacerbation), Acute encephalopathy metabolic versus medication related. Patient also has a history of hypertension, hyperlipidemia, Abdominal aortic aneurysm, and Chronic systolic CHF (congestive heart failure). [facility] Care Management high cost screening completed on 03/28/16. Had ED visit on 11/27/15 for urinary retention. Patient hospitalized 11/24/15 - 11/25/15 for BPH (benign prostatic hypertrophy) with urinary retention. Status post Transurethral resection of the prostate and removal of bladder stones on 11/24/15. Had ED visit on 09/08/15 for urinary retention.  Assessment:Received UMR Transition of care referral on 04/20/16. Transition of care pending patient outreach.   Plan: RNCM will send patient unsuccessful outreach letter, [facility] pamphlet, and proceed with case closure within 10 business days, if no return call.   [name] H. Annia Friendly, BSN, [place] [facility] Telephonic CM Phone: [phone] Fax: [phone]

## 2016-04-26 DIAGNOSIS — J9621 Acute and chronic respiratory failure with hypoxia: Secondary | ICD-10-CM | POA: Diagnosis not present

## 2016-04-26 DIAGNOSIS — I110 Hypertensive heart disease with heart failure: Secondary | ICD-10-CM | POA: Diagnosis not present

## 2016-04-26 DIAGNOSIS — Z87891 Personal history of nicotine dependence: Secondary | ICD-10-CM | POA: Diagnosis not present

## 2016-04-26 DIAGNOSIS — I5032 Chronic diastolic (congestive) heart failure: Secondary | ICD-10-CM | POA: Diagnosis not present

## 2016-04-26 DIAGNOSIS — Z7982 Long term (current) use of aspirin: Secondary | ICD-10-CM | POA: Diagnosis not present

## 2016-04-26 DIAGNOSIS — J440 Chronic obstructive pulmonary disease with acute lower respiratory infection: Secondary | ICD-10-CM | POA: Diagnosis not present

## 2016-04-26 DIAGNOSIS — J189 Pneumonia, unspecified organism: Secondary | ICD-10-CM | POA: Diagnosis not present

## 2016-04-26 DIAGNOSIS — J441 Chronic obstructive pulmonary disease with (acute) exacerbation: Secondary | ICD-10-CM | POA: Diagnosis not present

## 2016-04-27 DIAGNOSIS — I110 Hypertensive heart disease with heart failure: Secondary | ICD-10-CM | POA: Diagnosis not present

## 2016-04-27 DIAGNOSIS — I5032 Chronic diastolic (congestive) heart failure: Secondary | ICD-10-CM | POA: Diagnosis not present

## 2016-04-27 DIAGNOSIS — J189 Pneumonia, unspecified organism: Secondary | ICD-10-CM | POA: Diagnosis not present

## 2016-04-27 DIAGNOSIS — J9621 Acute and chronic respiratory failure with hypoxia: Secondary | ICD-10-CM | POA: Diagnosis not present

## 2016-04-27 DIAGNOSIS — J441 Chronic obstructive pulmonary disease with (acute) exacerbation: Secondary | ICD-10-CM | POA: Diagnosis not present

## 2016-04-27 DIAGNOSIS — Z7982 Long term (current) use of aspirin: Secondary | ICD-10-CM | POA: Diagnosis not present

## 2016-04-27 DIAGNOSIS — J440 Chronic obstructive pulmonary disease with acute lower respiratory infection: Secondary | ICD-10-CM | POA: Diagnosis not present

## 2016-04-27 DIAGNOSIS — Z87891 Personal history of nicotine dependence: Secondary | ICD-10-CM | POA: Diagnosis not present

## 2016-05-01 MED FILL — ALBUTEROL 0.083% INHAL SOLN: (2.5 MG/3ML | 4 days supply | Qty: 75 | Fill #1

## 2016-05-02 ENCOUNTER — Telehealth: Payer: Self-pay | Admitting: Family Medicine

## 2016-05-02 DIAGNOSIS — Z87891 Personal history of nicotine dependence: Secondary | ICD-10-CM | POA: Diagnosis not present

## 2016-05-02 DIAGNOSIS — I110 Hypertensive heart disease with heart failure: Secondary | ICD-10-CM | POA: Diagnosis not present

## 2016-05-02 DIAGNOSIS — J441 Chronic obstructive pulmonary disease with (acute) exacerbation: Secondary | ICD-10-CM | POA: Diagnosis not present

## 2016-05-02 DIAGNOSIS — I5032 Chronic diastolic (congestive) heart failure: Secondary | ICD-10-CM | POA: Diagnosis not present

## 2016-05-02 DIAGNOSIS — Z7982 Long term (current) use of aspirin: Secondary | ICD-10-CM | POA: Diagnosis not present

## 2016-05-02 DIAGNOSIS — J189 Pneumonia, unspecified organism: Secondary | ICD-10-CM | POA: Diagnosis not present

## 2016-05-02 DIAGNOSIS — J440 Chronic obstructive pulmonary disease with acute lower respiratory infection: Secondary | ICD-10-CM | POA: Diagnosis not present

## 2016-05-02 DIAGNOSIS — J9621 Acute and chronic respiratory failure with hypoxia: Secondary | ICD-10-CM | POA: Diagnosis not present

## 2016-05-02 NOTE — Telephone Encounter (Signed)
Prescription faxed as requested.

## 2016-05-02 NOTE — Telephone Encounter (Signed)
Let's do 

## 2016-05-02 NOTE — Telephone Encounter (Signed)
Nurse from [facility] called stating that the pt is needing a prescription for a flutter valve sent over to [facility]. Pt does not have the one from the hospital. Please advise.    [facility]

## 2016-05-03 ENCOUNTER — Telehealth: Payer: Self-pay | Admitting: Family Medicine

## 2016-05-03 DIAGNOSIS — J449 Chronic obstructive pulmonary disease, unspecified: Secondary | ICD-10-CM | POA: Diagnosis not present

## 2016-05-03 NOTE — Telephone Encounter (Signed)
Pt notified. Script faxed

## 2016-05-03 NOTE — Telephone Encounter (Signed)
Requesting Rx for tubing for nebulizer machine to send to [facility].

## 2016-05-04 DIAGNOSIS — J441 Chronic obstructive pulmonary disease with (acute) exacerbation: Secondary | ICD-10-CM | POA: Diagnosis not present

## 2016-05-04 DIAGNOSIS — I5032 Chronic diastolic (congestive) heart failure: Secondary | ICD-10-CM | POA: Diagnosis not present

## 2016-05-04 DIAGNOSIS — Z87891 Personal history of nicotine dependence: Secondary | ICD-10-CM | POA: Diagnosis not present

## 2016-05-04 DIAGNOSIS — Z7982 Long term (current) use of aspirin: Secondary | ICD-10-CM | POA: Diagnosis not present

## 2016-05-04 DIAGNOSIS — J189 Pneumonia, unspecified organism: Secondary | ICD-10-CM | POA: Diagnosis not present

## 2016-05-04 DIAGNOSIS — J440 Chronic obstructive pulmonary disease with acute lower respiratory infection: Secondary | ICD-10-CM | POA: Diagnosis not present

## 2016-05-04 DIAGNOSIS — I110 Hypertensive heart disease with heart failure: Secondary | ICD-10-CM | POA: Diagnosis not present

## 2016-05-04 DIAGNOSIS — J9621 Acute and chronic respiratory failure with hypoxia: Secondary | ICD-10-CM | POA: Diagnosis not present

## 2016-05-08 DIAGNOSIS — I5032 Chronic diastolic (congestive) heart failure: Secondary | ICD-10-CM | POA: Diagnosis not present

## 2016-05-08 DIAGNOSIS — I110 Hypertensive heart disease with heart failure: Secondary | ICD-10-CM | POA: Diagnosis not present

## 2016-05-08 DIAGNOSIS — J9621 Acute and chronic respiratory failure with hypoxia: Secondary | ICD-10-CM | POA: Diagnosis not present

## 2016-05-08 DIAGNOSIS — J440 Chronic obstructive pulmonary disease with acute lower respiratory infection: Secondary | ICD-10-CM | POA: Diagnosis not present

## 2016-05-08 DIAGNOSIS — J441 Chronic obstructive pulmonary disease with (acute) exacerbation: Secondary | ICD-10-CM | POA: Diagnosis not present

## 2016-05-08 DIAGNOSIS — Z7982 Long term (current) use of aspirin: Secondary | ICD-10-CM | POA: Diagnosis not present

## 2016-05-08 DIAGNOSIS — Z87891 Personal history of nicotine dependence: Secondary | ICD-10-CM | POA: Diagnosis not present

## 2016-05-08 DIAGNOSIS — J189 Pneumonia, unspecified organism: Secondary | ICD-10-CM | POA: Diagnosis not present

## 2016-05-09 ENCOUNTER — Other Ambulatory Visit: Payer: Self-pay | Admitting: *Deleted

## 2016-05-09 NOTE — Patient Outreach (Addendum)
[facility]) Care Management  05/09/2016  Tommy Turner 05-13-1940 [id number]  No response to patient outreach attempts, will proceed with case closure.  Objective: Per chart review: Patient hospitalized 04/04/16 - 04/17/16 for sepsis due to community acquired pneumonia, Acute on chronic hypoxic respiratory failure (Due to pneumonia and possible COPD exacerbation), Acute encephalopathy metabolic versus medication related. Patient also has a history of hypertension, hyperlipidemia, Abdominal aortic aneurysm, and Chronic systolic CHF (congestive heart failure). [facility] Care Management high cost screening completed on 03/28/16. Had ED visit on 11/27/15 for urinary retention. Patient hospitalized 11/24/15 - 11/25/15 for BPH (benign prostatic hypertrophy) with urinary retention. Status post Transurethral resection of the prostate and removal of bladder stones on 11/24/15. Had ED visit on 09/08/15 for urinary retention.  Assessment:Received UMR Transition of care referral on 04/20/16. Transition of care follow up not completed, patient unable to contact, and will proceed with case closure.   Plan: RNCM will send case closure due to unable to reach request to [facility] [facility].     Alberto Schoch H. Annia Friendly, BSN, [place] Management [facility] Telephonic CM Phone: [phone] Fax: [phone]

## 2016-05-10 DIAGNOSIS — Z87891 Personal history of nicotine dependence: Secondary | ICD-10-CM | POA: Diagnosis not present

## 2016-05-10 DIAGNOSIS — J9621 Acute and chronic respiratory failure with hypoxia: Secondary | ICD-10-CM | POA: Diagnosis not present

## 2016-05-10 DIAGNOSIS — J189 Pneumonia, unspecified organism: Secondary | ICD-10-CM | POA: Diagnosis not present

## 2016-05-10 DIAGNOSIS — I5032 Chronic diastolic (congestive) heart failure: Secondary | ICD-10-CM | POA: Diagnosis not present

## 2016-05-10 DIAGNOSIS — J440 Chronic obstructive pulmonary disease with acute lower respiratory infection: Secondary | ICD-10-CM | POA: Diagnosis not present

## 2016-05-10 DIAGNOSIS — Z7982 Long term (current) use of aspirin: Secondary | ICD-10-CM | POA: Diagnosis not present

## 2016-05-10 DIAGNOSIS — I110 Hypertensive heart disease with heart failure: Secondary | ICD-10-CM | POA: Diagnosis not present

## 2016-05-10 DIAGNOSIS — J441 Chronic obstructive pulmonary disease with (acute) exacerbation: Secondary | ICD-10-CM | POA: Diagnosis not present

## 2016-05-10 MED FILL — ALBUTEROL 0.083% INHAL SOLN: (2.5 MG/3ML | 37 days supply | Qty: 225 | Fill #2

## 2016-05-13 DIAGNOSIS — J449 Chronic obstructive pulmonary disease, unspecified: Secondary | ICD-10-CM | POA: Diagnosis not present

## 2016-05-13 DIAGNOSIS — J441 Chronic obstructive pulmonary disease with (acute) exacerbation: Secondary | ICD-10-CM | POA: Diagnosis not present

## 2016-05-13 DIAGNOSIS — I5043 Acute on chronic combined systolic (congestive) and diastolic (congestive) heart failure: Secondary | ICD-10-CM | POA: Diagnosis not present

## 2016-05-14 DIAGNOSIS — Z7982 Long term (current) use of aspirin: Secondary | ICD-10-CM | POA: Diagnosis not present

## 2016-05-14 DIAGNOSIS — I5032 Chronic diastolic (congestive) heart failure: Secondary | ICD-10-CM | POA: Diagnosis not present

## 2016-05-14 DIAGNOSIS — J9621 Acute and chronic respiratory failure with hypoxia: Secondary | ICD-10-CM | POA: Diagnosis not present

## 2016-05-14 DIAGNOSIS — J441 Chronic obstructive pulmonary disease with (acute) exacerbation: Secondary | ICD-10-CM | POA: Diagnosis not present

## 2016-05-14 DIAGNOSIS — J189 Pneumonia, unspecified organism: Secondary | ICD-10-CM | POA: Diagnosis not present

## 2016-05-14 DIAGNOSIS — Z87891 Personal history of nicotine dependence: Secondary | ICD-10-CM | POA: Diagnosis not present

## 2016-05-14 DIAGNOSIS — J440 Chronic obstructive pulmonary disease with acute lower respiratory infection: Secondary | ICD-10-CM | POA: Diagnosis not present

## 2016-05-14 DIAGNOSIS — I110 Hypertensive heart disease with heart failure: Secondary | ICD-10-CM | POA: Diagnosis not present

## 2016-05-16 DIAGNOSIS — J9621 Acute and chronic respiratory failure with hypoxia: Secondary | ICD-10-CM | POA: Diagnosis not present

## 2016-05-16 DIAGNOSIS — Z87891 Personal history of nicotine dependence: Secondary | ICD-10-CM | POA: Diagnosis not present

## 2016-05-16 DIAGNOSIS — J189 Pneumonia, unspecified organism: Secondary | ICD-10-CM | POA: Diagnosis not present

## 2016-05-16 DIAGNOSIS — J441 Chronic obstructive pulmonary disease with (acute) exacerbation: Secondary | ICD-10-CM | POA: Diagnosis not present

## 2016-05-16 DIAGNOSIS — Z7982 Long term (current) use of aspirin: Secondary | ICD-10-CM | POA: Diagnosis not present

## 2016-05-16 DIAGNOSIS — J440 Chronic obstructive pulmonary disease with acute lower respiratory infection: Secondary | ICD-10-CM | POA: Diagnosis not present

## 2016-05-16 DIAGNOSIS — I110 Hypertensive heart disease with heart failure: Secondary | ICD-10-CM | POA: Diagnosis not present

## 2016-05-16 DIAGNOSIS — I5032 Chronic diastolic (congestive) heart failure: Secondary | ICD-10-CM | POA: Diagnosis not present

## 2016-05-17 ENCOUNTER — Telehealth: Payer: Self-pay | Admitting: Family Medicine

## 2016-05-17 MED ORDER — HYDROCODONE-ACETAMINOPHEN 10-325 MG PO TABS: 0.5000 | ORAL_TABLET | Freq: Four times a day (QID) | ORAL | 0 refills | Status: DC | PRN

## 2016-05-17 NOTE — Telephone Encounter (Signed)
Please find out when he got his [facility] they do not put this on the registry. #2 please clarify with the patient what does he mean by cigarette patches-I [place] soon nicotine patches? Is he currently using any? Is his goal to taper off the patch?

## 2016-05-17 NOTE — Telephone Encounter (Signed)
Rx for pain med ready for pick up.

## 2016-05-17 NOTE — Telephone Encounter (Signed)
Requesting Rx for HYDROcodone-acetaminophen (NORCO) 10-325 MG tablet.  Also, needing Rx for cigarette patches to [place]

## 2016-05-17 NOTE — Telephone Encounter (Signed)
May go ahead with a refill on his pain medicine. As for the patches where have to wait to we get more information thank you

## 2016-05-17 NOTE — Telephone Encounter (Signed)
[facility] reports last filled there 03/12/16.    I LMTCB to inquire about nicotine patches.

## 2016-05-18 MED ORDER — NICOTINE 7 MG/24HR TD PT24: 7.0000 mg | MEDICATED_PATCH | Freq: Every day | TRANSDERMAL | 2 refills | Status: DC

## 2016-05-18 MED ORDER — NICOTINE 14 MG/24HR TD PT24: 14.0000 mg | MEDICATED_PATCH | Freq: Every day | TRANSDERMAL | 0 refills | Status: DC

## 2016-05-18 MED FILL — NICOTINE 7 MG/24HR PATCH: 7 | 28 days supply | Qty: 28 | Fill #0

## 2016-05-18 MED FILL — NICOTINE 14 MG/24HR PATCH: 14 | 28 days supply | Qty: 28 | Fill #0

## 2016-05-18 NOTE — Telephone Encounter (Signed)
I'm assuming that the nicotine patches are something that they are using every single day I would go for 30 patches with 2 refills if they are using the same dose long-term it is important for them to let us know so we can do accordingly. Finally if they are gearing toward trying to taper off after 4 weeks I would recommend going down to 7 mg patch

## 2016-05-18 NOTE — Telephone Encounter (Signed)
Patient's wife called back. I advised RX ready for pick up.  She states she is unsure what strength nicotine patches are, but patient is wearing one, and does plan to taper off. I advised her to call back when she finds out the strength pt is wearing. She voiced understanding and agreed with plan.  I called [name] back to see if she would like the rx faxed to [facility] or [facility] or p/u here. LMTCB

## 2016-05-18 NOTE — Telephone Encounter (Signed)
I spoke with pt's wife and advised of the nicotine patches-instructions  that I sent in. She voiced understanding and agreed with plan.

## 2016-05-18 NOTE — Telephone Encounter (Signed)
Pt's wife picked up rx today. She states the pt's nicotine patches are 14mg .

## 2016-05-21 DIAGNOSIS — J189 Pneumonia, unspecified organism: Secondary | ICD-10-CM | POA: Diagnosis not present

## 2016-05-21 DIAGNOSIS — I110 Hypertensive heart disease with heart failure: Secondary | ICD-10-CM | POA: Diagnosis not present

## 2016-05-21 DIAGNOSIS — Z87891 Personal history of nicotine dependence: Secondary | ICD-10-CM | POA: Diagnosis not present

## 2016-05-21 DIAGNOSIS — J440 Chronic obstructive pulmonary disease with acute lower respiratory infection: Secondary | ICD-10-CM | POA: Diagnosis not present

## 2016-05-21 DIAGNOSIS — Z7982 Long term (current) use of aspirin: Secondary | ICD-10-CM | POA: Diagnosis not present

## 2016-05-21 DIAGNOSIS — J441 Chronic obstructive pulmonary disease with (acute) exacerbation: Secondary | ICD-10-CM | POA: Diagnosis not present

## 2016-05-21 DIAGNOSIS — J9621 Acute and chronic respiratory failure with hypoxia: Secondary | ICD-10-CM | POA: Diagnosis not present

## 2016-05-21 DIAGNOSIS — I5032 Chronic diastolic (congestive) heart failure: Secondary | ICD-10-CM | POA: Diagnosis not present

## 2016-05-21 MED FILL — clonazePAM 0.5 MG TABS: 0.5 | 30 days supply | Qty: 60 | Fill #1

## 2016-05-22 ENCOUNTER — Other Ambulatory Visit: Payer: Self-pay | Admitting: Family Medicine

## 2016-05-22 ENCOUNTER — Telehealth: Payer: Self-pay | Admitting: Family Medicine

## 2016-05-22 DIAGNOSIS — J440 Chronic obstructive pulmonary disease with acute lower respiratory infection: Secondary | ICD-10-CM | POA: Diagnosis not present

## 2016-05-22 DIAGNOSIS — I5032 Chronic diastolic (congestive) heart failure: Secondary | ICD-10-CM | POA: Diagnosis not present

## 2016-05-22 DIAGNOSIS — Z7982 Long term (current) use of aspirin: Secondary | ICD-10-CM | POA: Diagnosis not present

## 2016-05-22 DIAGNOSIS — J441 Chronic obstructive pulmonary disease with (acute) exacerbation: Secondary | ICD-10-CM | POA: Diagnosis not present

## 2016-05-22 DIAGNOSIS — Z87891 Personal history of nicotine dependence: Secondary | ICD-10-CM | POA: Diagnosis not present

## 2016-05-22 DIAGNOSIS — J9621 Acute and chronic respiratory failure with hypoxia: Secondary | ICD-10-CM | POA: Diagnosis not present

## 2016-05-22 DIAGNOSIS — I110 Hypertensive heart disease with heart failure: Secondary | ICD-10-CM | POA: Diagnosis not present

## 2016-05-22 DIAGNOSIS — J189 Pneumonia, unspecified organism: Secondary | ICD-10-CM | POA: Diagnosis not present

## 2016-05-22 MED ORDER — TORSEMIDE 20 MG PO TABS: 20.0000 mg | ORAL_TABLET | Freq: Every day | ORAL | 1 refills | Status: DC

## 2016-05-22 NOTE — Telephone Encounter (Signed)
I spoke with home health nurse-Julie-patient was having dizziness and positive orthostatic readings. Reviewing his medicines I recommended to reduce Demadex-new dosage 20 mg every morning. Also Lipitor he is no longer taking-which I agree with. Home health will do a metabolic 7 they will call us back next week with an update  Nurse's-please change directions in Epic for Demadex to be 20 mg every morning

## 2016-05-22 NOTE — Addendum Note (Signed)
Addended by: Dairl Ponder on: 05/22/2016 11:14 AM   Modules accepted: Orders

## 2016-05-22 NOTE — Telephone Encounter (Signed)
Medication directions updated in EPIC

## 2016-05-24 MED FILL — HYDROCODON-APAP 10-325: 10-325 | 30 days supply | Qty: 60 | Fill #0

## 2016-05-25 DIAGNOSIS — J441 Chronic obstructive pulmonary disease with (acute) exacerbation: Secondary | ICD-10-CM | POA: Diagnosis not present

## 2016-05-25 DIAGNOSIS — I5032 Chronic diastolic (congestive) heart failure: Secondary | ICD-10-CM | POA: Diagnosis not present

## 2016-05-25 DIAGNOSIS — Z7982 Long term (current) use of aspirin: Secondary | ICD-10-CM | POA: Diagnosis not present

## 2016-05-25 DIAGNOSIS — J9621 Acute and chronic respiratory failure with hypoxia: Secondary | ICD-10-CM | POA: Diagnosis not present

## 2016-05-25 DIAGNOSIS — J440 Chronic obstructive pulmonary disease with acute lower respiratory infection: Secondary | ICD-10-CM | POA: Diagnosis not present

## 2016-05-25 DIAGNOSIS — I110 Hypertensive heart disease with heart failure: Secondary | ICD-10-CM | POA: Diagnosis not present

## 2016-05-25 DIAGNOSIS — Z87891 Personal history of nicotine dependence: Secondary | ICD-10-CM | POA: Diagnosis not present

## 2016-05-25 DIAGNOSIS — J189 Pneumonia, unspecified organism: Secondary | ICD-10-CM | POA: Diagnosis not present

## 2016-05-28 ENCOUNTER — Ambulatory Visit (INDEPENDENT_AMBULATORY_CARE_PROVIDER_SITE_OTHER): Payer: 59 | Admitting: Otolaryngology

## 2016-05-29 ENCOUNTER — Other Ambulatory Visit (HOSPITAL_COMMUNITY)
Admission: RE | Admit: 2016-05-29 | Discharge: 2016-05-29 | Disposition: A | Discharge status: Home / Self Care | Payer: 59 | Source: Other Acute Inpatient Hospital | Attending: Family Medicine | Admitting: Family Medicine

## 2016-05-29 ENCOUNTER — Encounter: Payer: Self-pay | Admitting: Family Medicine

## 2016-05-29 DIAGNOSIS — J441 Chronic obstructive pulmonary disease with (acute) exacerbation: Secondary | ICD-10-CM | POA: Diagnosis not present

## 2016-05-29 DIAGNOSIS — J189 Pneumonia, unspecified organism: Secondary | ICD-10-CM | POA: Diagnosis not present

## 2016-05-29 DIAGNOSIS — Z7982 Long term (current) use of aspirin: Secondary | ICD-10-CM | POA: Diagnosis not present

## 2016-05-29 DIAGNOSIS — I110 Hypertensive heart disease with heart failure: Secondary | ICD-10-CM | POA: Diagnosis not present

## 2016-05-29 DIAGNOSIS — Z87891 Personal history of nicotine dependence: Secondary | ICD-10-CM | POA: Diagnosis not present

## 2016-05-29 DIAGNOSIS — J440 Chronic obstructive pulmonary disease with acute lower respiratory infection: Secondary | ICD-10-CM | POA: Diagnosis not present

## 2016-05-29 DIAGNOSIS — I5032 Chronic diastolic (congestive) heart failure: Secondary | ICD-10-CM | POA: Diagnosis not present

## 2016-05-29 DIAGNOSIS — J9621 Acute and chronic respiratory failure with hypoxia: Secondary | ICD-10-CM | POA: Diagnosis not present

## 2016-05-29 LAB — BASIC METABOLIC PANEL
ANION GAP: 5 (ref 5–15)
BUN: 19 mg/dL (ref 6–20)
CALCIUM: 8.9 mg/dL (ref 8.9–10.3)
CO2: 34 mmol/L — AB (ref 22–32)
Chloride: 100 mmol/L — ABNORMAL LOW (ref 101–111)
Creatinine, Ser: 0.82 mg/dL (ref 0.61–1.24)
GFR calc Af Amer: >60 mL/min (ref >60)
GFR calc non Af Amer: >60 mL/min (ref >60)
GLUCOSE: 104 mg/dL — AB (ref 65–99)
Potassium: 4 mmol/L (ref 3.5–5.1)
Sodium: 139 mmol/L (ref 135–145)

## 2016-05-29 MED FILL — VENTOLIN HFA 90 MCG INHALER: 108 (90 BAS | 25 days supply | Qty: 18 | Fill #4

## 2016-05-29 MED FILL — KLOR-CON M20 TABLET: 20 | 90 days supply | Qty: 90 | Fill #1

## 2016-06-03 DIAGNOSIS — J449 Chronic obstructive pulmonary disease, unspecified: Secondary | ICD-10-CM | POA: Diagnosis not present

## 2016-06-04 DIAGNOSIS — Z7982 Long term (current) use of aspirin: Secondary | ICD-10-CM | POA: Diagnosis not present

## 2016-06-04 DIAGNOSIS — I110 Hypertensive heart disease with heart failure: Secondary | ICD-10-CM | POA: Diagnosis not present

## 2016-06-04 DIAGNOSIS — I5032 Chronic diastolic (congestive) heart failure: Secondary | ICD-10-CM | POA: Diagnosis not present

## 2016-06-04 DIAGNOSIS — J9621 Acute and chronic respiratory failure with hypoxia: Secondary | ICD-10-CM | POA: Diagnosis not present

## 2016-06-04 DIAGNOSIS — J440 Chronic obstructive pulmonary disease with acute lower respiratory infection: Secondary | ICD-10-CM | POA: Diagnosis not present

## 2016-06-04 DIAGNOSIS — J441 Chronic obstructive pulmonary disease with (acute) exacerbation: Secondary | ICD-10-CM | POA: Diagnosis not present

## 2016-06-04 DIAGNOSIS — J189 Pneumonia, unspecified organism: Secondary | ICD-10-CM | POA: Diagnosis not present

## 2016-06-04 DIAGNOSIS — Z87891 Personal history of nicotine dependence: Secondary | ICD-10-CM | POA: Diagnosis not present

## 2016-06-11 ENCOUNTER — Encounter: Payer: Self-pay | Admitting: Cardiology

## 2016-06-11 ENCOUNTER — Other Ambulatory Visit: Payer: Self-pay | Admitting: Family Medicine

## 2016-06-11 MED FILL — SPIRIVA 18 MCG CP-HANDIHALE: 18 | 30 days supply | Qty: 30 | Fill #0

## 2016-06-11 MED FILL — ALBUTEROL 0.083% INHAL SOLN: (2.5 MG/3ML | 37 days supply | Qty: 225 | Fill #3

## 2016-06-13 DIAGNOSIS — J449 Chronic obstructive pulmonary disease, unspecified: Secondary | ICD-10-CM | POA: Diagnosis not present

## 2016-06-13 DIAGNOSIS — I5043 Acute on chronic combined systolic (congestive) and diastolic (congestive) heart failure: Secondary | ICD-10-CM | POA: Diagnosis not present

## 2016-06-13 DIAGNOSIS — J441 Chronic obstructive pulmonary disease with (acute) exacerbation: Secondary | ICD-10-CM | POA: Diagnosis not present

## 2016-06-14 DIAGNOSIS — I5032 Chronic diastolic (congestive) heart failure: Secondary | ICD-10-CM | POA: Diagnosis not present

## 2016-06-14 DIAGNOSIS — J440 Chronic obstructive pulmonary disease with acute lower respiratory infection: Secondary | ICD-10-CM | POA: Diagnosis not present

## 2016-06-14 DIAGNOSIS — J441 Chronic obstructive pulmonary disease with (acute) exacerbation: Secondary | ICD-10-CM | POA: Diagnosis not present

## 2016-06-14 DIAGNOSIS — J9621 Acute and chronic respiratory failure with hypoxia: Secondary | ICD-10-CM | POA: Diagnosis not present

## 2016-06-14 DIAGNOSIS — Z87891 Personal history of nicotine dependence: Secondary | ICD-10-CM | POA: Diagnosis not present

## 2016-06-14 DIAGNOSIS — Z7982 Long term (current) use of aspirin: Secondary | ICD-10-CM | POA: Diagnosis not present

## 2016-06-14 DIAGNOSIS — J189 Pneumonia, unspecified organism: Secondary | ICD-10-CM | POA: Diagnosis not present

## 2016-06-14 DIAGNOSIS — I110 Hypertensive heart disease with heart failure: Secondary | ICD-10-CM | POA: Diagnosis not present

## 2016-06-18 ENCOUNTER — Ambulatory Visit (INDEPENDENT_AMBULATORY_CARE_PROVIDER_SITE_OTHER): Payer: 59 | Admitting: Family Medicine

## 2016-06-18 ENCOUNTER — Encounter: Payer: Self-pay | Admitting: Family Medicine

## 2016-06-18 VITALS — BP 108/70 | Ht 67.0 in | Wt 133.2 lb

## 2016-06-18 DIAGNOSIS — J431 Panlobular emphysema: Secondary | ICD-10-CM

## 2016-06-18 DIAGNOSIS — D692 Other nonthrombocytopenic purpura: Secondary | ICD-10-CM

## 2016-06-18 DIAGNOSIS — R64 Cachexia: Secondary | ICD-10-CM | POA: Diagnosis not present

## 2016-06-18 DIAGNOSIS — I5022 Chronic systolic (congestive) heart failure: Secondary | ICD-10-CM

## 2016-06-18 MED ORDER — OSELTAMIVIR PHOSPHATE 75 MG PO CAPS
75.0000 mg | ORAL_CAPSULE | Freq: Two times a day (BID) | ORAL | 0 refills | Status: DC
Start: 2016-06-18 — End: 2016-08-17

## 2016-06-18 MED ORDER — HYDROCODONE-ACETAMINOPHEN 10-325 MG PO TABS: 0.5000 | ORAL_TABLET | Freq: Four times a day (QID) | ORAL | 0 refills | Status: DC | PRN

## 2016-06-18 NOTE — Progress Notes (Signed)
   Subjective:    Patient ID: Tommy Turner, male    DOB: 11/30/1940, 76 y.o.   MRN: [id number]  Cough  This is a new problem. The current episode started in the past 7 days. Associated symptoms comments: Dizziness.  Patient describes dizziness when he stands up or when he gets up. Seems to be worse with certain movements to some degree. Trying to eat relatively healthy. He states he's eating well not gaining any weight. Patient has concerns of pain to left shoulder. Patient relates that he has left shoulder pain when he does certain movements. Denies any injury to it. Denies any numbness or tingling. Denies hemoptysis denies high fever chills   Review of Systems  Respiratory: Positive for cough.   Patient denies high fever chills vomiting diarrhea does relate shortness breath seems to be worse at nighttime. Sleeps propped up. Has chronic swelling in the lower legs.     Objective:   Physical Exam Senile purpura noted on exam Pedal edema noted but not severe lungs clear no crackles heart regular pulse normal blood pressure on the low end Patient has appearance of cachexia. Also have senile purpura     Assessment & Plan:  Left shoulder pain probable mild tendinitis versus arthritis recommend Tylenol when pain is severe hydrocodone  Severe COPD continue Spiriva as well as albuterol  CHF low-dose beta blocker on fluid pills monitor for CHF  Hypoxia-continue oxygen may increase to 3 L at nighttime stick with 2-1/2 during the day  Patient with moderate cachexia related to his severe COPD and weakening condition long-term prognosis poor

## 2016-06-20 DIAGNOSIS — I110 Hypertensive heart disease with heart failure: Secondary | ICD-10-CM | POA: Diagnosis not present

## 2016-06-20 DIAGNOSIS — J441 Chronic obstructive pulmonary disease with (acute) exacerbation: Secondary | ICD-10-CM | POA: Diagnosis not present

## 2016-06-20 DIAGNOSIS — J440 Chronic obstructive pulmonary disease with acute lower respiratory infection: Secondary | ICD-10-CM | POA: Diagnosis not present

## 2016-06-20 DIAGNOSIS — Z7982 Long term (current) use of aspirin: Secondary | ICD-10-CM | POA: Diagnosis not present

## 2016-06-20 DIAGNOSIS — I5032 Chronic diastolic (congestive) heart failure: Secondary | ICD-10-CM | POA: Diagnosis not present

## 2016-06-20 DIAGNOSIS — J9621 Acute and chronic respiratory failure with hypoxia: Secondary | ICD-10-CM | POA: Diagnosis not present

## 2016-06-20 DIAGNOSIS — J189 Pneumonia, unspecified organism: Secondary | ICD-10-CM | POA: Diagnosis not present

## 2016-06-20 DIAGNOSIS — Z87891 Personal history of nicotine dependence: Secondary | ICD-10-CM | POA: Diagnosis not present

## 2016-06-28 MED FILL — TORSEMIDE 20 MG TABLET: 20 | 90 days supply | Qty: 180 | Fill #1

## 2016-07-04 ENCOUNTER — Telehealth: Payer: Self-pay | Admitting: Family Medicine

## 2016-07-04 DIAGNOSIS — J449 Chronic obstructive pulmonary disease, unspecified: Secondary | ICD-10-CM | POA: Diagnosis not present

## 2016-07-04 MED ORDER — ALBUTEROL SULFATE (2.5 MG/3ML) 0.083% IN NEBU: 2.5000 mg | INHALATION_SOLUTION | RESPIRATORY_TRACT | 5 refills | Status: DC | PRN

## 2016-07-04 MED FILL — VENTOLIN HFA 90 MCG INHALER: 108 (90 BAS | 25 days supply | Qty: 18 | Fill #5

## 2016-07-04 MED FILL — ALBUTEROL 0.083% INHAL SOLN: (2.5 MG/3ML | 15 days supply | Qty: 150 | Fill #0

## 2016-07-04 NOTE — Telephone Encounter (Signed)
Please touch base with the patient find out how often they are using it per day and we can order a month supply at a time or it can be rewritten as every 2 hours when necessary for the albuterol nebulizer solution with double the amount with 6 months a refill

## 2016-07-04 NOTE — Telephone Encounter (Signed)
Patient is having to use his nebulizer more often now and he is needing a new Rx wrote for his albuterol to show this.  [facility]

## 2016-07-04 NOTE — Telephone Encounter (Signed)
Med sent to pharmacy. [name] was notified.

## 2016-07-05 MED FILL — SPIRIVA 18 MCG CP-HANDIHALE: 18 | 30 days supply | Qty: 30 | Fill #1

## 2016-07-11 DIAGNOSIS — I5043 Acute on chronic combined systolic (congestive) and diastolic (congestive) heart failure: Secondary | ICD-10-CM | POA: Diagnosis not present

## 2016-07-11 DIAGNOSIS — J449 Chronic obstructive pulmonary disease, unspecified: Secondary | ICD-10-CM | POA: Diagnosis not present

## 2016-07-11 DIAGNOSIS — J441 Chronic obstructive pulmonary disease with (acute) exacerbation: Secondary | ICD-10-CM | POA: Diagnosis not present

## 2016-07-18 MED FILL — HYDROCODON-APAP 10-325: 10-325 | 30 days supply | Qty: 60 | Fill #0

## 2016-07-20 ENCOUNTER — Ambulatory Visit: Payer: 59 | Admitting: Family Medicine

## 2016-07-23 MED FILL — clonazePAM 0.5 MG TABS: 0.5 | 30 days supply | Qty: 60 | Fill #2

## 2016-07-26 MED FILL — ALBUTEROL 0.083% INHAL SOLN: (2.5 MG/3ML | 15 days supply | Qty: 150 | Fill #1

## 2016-07-27 ENCOUNTER — Other Ambulatory Visit: Payer: Self-pay

## 2016-07-27 MED ORDER — ALBUTEROL SULFATE HFA 108 (90 BASE) MCG/ACT IN AERS: 2.0000 | INHALATION_SPRAY | RESPIRATORY_TRACT | 5 refills | Status: DC | PRN

## 2016-07-27 MED FILL — VENTOLIN HFA 90 MCG INHALER: 108 (90 BAS | 17 days supply | Qty: 18 | Fill #0

## 2016-08-01 DIAGNOSIS — J449 Chronic obstructive pulmonary disease, unspecified: Secondary | ICD-10-CM | POA: Diagnosis not present

## 2016-08-06 MED FILL — SPIRIVA 18 MCG CP-HANDIHALE: 18 | 30 days supply | Qty: 30 | Fill #2

## 2016-08-08 ENCOUNTER — Telehealth: Payer: Self-pay | Admitting: Family Medicine

## 2016-08-08 NOTE — Telephone Encounter (Signed)
Pt dropped off a handicap placard form to be filled out. Form is in dr folder in office.

## 2016-08-09 NOTE — Telephone Encounter (Signed)
This was completed thank you 

## 2016-08-17 ENCOUNTER — Ambulatory Visit (INDEPENDENT_AMBULATORY_CARE_PROVIDER_SITE_OTHER): Payer: 59 | Admitting: Family Medicine

## 2016-08-17 ENCOUNTER — Encounter: Payer: Self-pay | Admitting: Family Medicine

## 2016-08-17 VITALS — BP 110/62 | Temp 98.5°F | Ht 67.0 in | Wt 136.5 lb

## 2016-08-17 DIAGNOSIS — J449 Chronic obstructive pulmonary disease, unspecified: Secondary | ICD-10-CM

## 2016-08-17 DIAGNOSIS — D692 Other nonthrombocytopenic purpura: Secondary | ICD-10-CM | POA: Diagnosis not present

## 2016-08-17 DIAGNOSIS — I5022 Chronic systolic (congestive) heart failure: Secondary | ICD-10-CM | POA: Diagnosis not present

## 2016-08-17 DIAGNOSIS — R0902 Hypoxemia: Secondary | ICD-10-CM | POA: Diagnosis not present

## 2016-08-17 DIAGNOSIS — I951 Orthostatic hypotension: Secondary | ICD-10-CM | POA: Diagnosis not present

## 2016-08-17 DIAGNOSIS — L853 Xerosis cutis: Secondary | ICD-10-CM | POA: Diagnosis not present

## 2016-08-17 NOTE — Progress Notes (Signed)
   Subjective:    Patient ID: Tommy Turner, male    DOB: [birth year], 76 y.o.   MRN: [id number]  Rash  This is a recurrent problem. The current episode started in the past 7 days. The affected locations include the back, left upper leg, right upper leg, left lower leg and right lower leg. (Dizziness)   Patient relates get dizzy when he stands up Patient states no other concerns this visit.  Review of Systems  Skin: Positive for rash.   CHF is stable COPD is stable Senile purpura noted on the arms stable    Objective:   Physical Exam Severe COPD lungs are clear hearts regular pulse normal BP on the low in extremities no edema dry skin is noted  Blood pressure 110/70 sitting one he stood up it was 96/68     Assessment & Plan:  Dry skin lotions recommended  Mild hypotension on a diuretic every day no sign of heart failure I recommend reducing the diuretic every other day if swelling starts occurring again reinitiate also may use a light amount of salt with diet but otherwise keep everything the same if any particular concerns or setbacks call us otherwise recheck in one month

## 2016-08-24 MED FILL — ALBUTEROL 0.083% INHAL SOLN: (2.5 MG/3ML | 15 days supply | Qty: 150 | Fill #2

## 2016-09-01 DIAGNOSIS — J449 Chronic obstructive pulmonary disease, unspecified: Secondary | ICD-10-CM | POA: Diagnosis not present

## 2016-09-04 MED FILL — POTASSIUM CL ER 20 MEQ TABL: 20 | 90 days supply | Qty: 90 | Fill #2

## 2016-09-04 MED FILL — METOPROLOL SUCC ER 25 MG TA: 25 | 90 days supply | Qty: 90 | Fill #1

## 2016-09-04 MED FILL — SPIRIVA 18 MCG CP-HANDIHALE: 18 | 30 days supply | Qty: 30 | Fill #3

## 2016-09-05 ENCOUNTER — Telehealth: Payer: Self-pay | Admitting: Family Medicine

## 2016-09-05 NOTE — Telephone Encounter (Signed)
Patient requesting refill on hydrocodone 10/325 °

## 2016-09-05 NOTE — Telephone Encounter (Signed)
Last filled 06/18/16

## 2016-09-06 ENCOUNTER — Other Ambulatory Visit: Payer: Self-pay | Admitting: *Deleted

## 2016-09-06 MED ORDER — HYDROCODONE-ACETAMINOPHEN 10-325 MG PO TABS: 0.5000 | ORAL_TABLET | Freq: Four times a day (QID) | ORAL | 0 refills | Status: DC | PRN

## 2016-09-06 NOTE — Telephone Encounter (Signed)
He may have refill

## 2016-09-06 NOTE — Telephone Encounter (Signed)
Script ready. Pt notified.  

## 2016-09-10 MED FILL — VENTOLIN HFA 90 MCG INHALER: 108 (90 BAS | 17 days supply | Qty: 18 | Fill #1

## 2016-09-10 MED FILL — HYDROCODON-APAP 10-325: 10-325 | 30 days supply | Qty: 60 | Fill #0

## 2016-09-12 ENCOUNTER — Other Ambulatory Visit: Payer: Self-pay | Admitting: Family Medicine

## 2016-09-12 NOTE — Telephone Encounter (Signed)
May give this +3 additional refills 

## 2016-09-14 MED FILL — clonazePAM 0.5 MG TABS: 0.5 | 30 days supply | Qty: 60 | Fill #0

## 2016-09-17 MED FILL — ALBUTEROL 0.083% INHAL SOLN: (2.5 MG/3ML | 15 days supply | Qty: 150 | Fill #3

## 2016-09-19 ENCOUNTER — Ambulatory Visit: Payer: 59 | Admitting: Family Medicine

## 2016-10-01 DIAGNOSIS — J449 Chronic obstructive pulmonary disease, unspecified: Secondary | ICD-10-CM | POA: Diagnosis not present

## 2016-10-02 MED FILL — SPIRIVA 18 MCG CP-HANDIHALE: 18 | 30 days supply | Qty: 30 | Fill #4

## 2016-10-11 DIAGNOSIS — Z029 Encounter for administrative examinations, unspecified: Secondary | ICD-10-CM

## 2016-10-12 ENCOUNTER — Telehealth: Payer: Self-pay | Admitting: Family Medicine

## 2016-10-12 NOTE — Telephone Encounter (Signed)
Patient wife had form faxed over to updated for FMLA. Please review,date,sign. In yellow folder.

## 2016-10-14 NOTE — Telephone Encounter (Signed)
This was reviewed and signed-thanks

## 2016-10-16 ENCOUNTER — Telehealth: Payer: Self-pay | Admitting: Family Medicine

## 2016-10-16 NOTE — Telephone Encounter (Signed)
Patient may have 2 refills one dated for June 13 the other one dated for July 13 #60 one twice a day when necessary in form family that the patient will need to do a follow-up office visit by early August

## 2016-10-16 NOTE — Telephone Encounter (Signed)
Requesting Rx for HYDROcodone-acetaminophen (NORCO) 10-325 MG tablet

## 2016-10-17 MED ORDER — HYDROCODONE-ACETAMINOPHEN 10-325 MG PO TABS: 0.5000 | ORAL_TABLET | Freq: Four times a day (QID) | ORAL | 0 refills | Status: DC | PRN

## 2016-10-17 NOTE — Telephone Encounter (Signed)
Spoke with patient's wife and informed her prescriptions are ready for pick up. Office visit due by August. PAtient's wife verbalized understanding.

## 2016-10-17 NOTE — Telephone Encounter (Signed)
Left message return call 10/17/2016

## 2016-10-18 MED FILL — VENTOLIN HFA 90 MCG INHALER: 108 (90 BAS | 17 days supply | Qty: 18 | Fill #2

## 2016-11-01 DIAGNOSIS — J449 Chronic obstructive pulmonary disease, unspecified: Secondary | ICD-10-CM | POA: Diagnosis not present

## 2016-11-05 MED FILL — ALBUTEROL 0.083% INHAL SOLN: (2.5 MG/3ML | 15 days supply | Qty: 150 | Fill #4

## 2016-11-05 MED FILL — SPIRIVA 18 MCG CP-HANDIHALE: 18 | 30 days supply | Qty: 30 | Fill #5

## 2016-11-15 MED FILL — TORSEMIDE 20 MG TABLET: 20 | 90 days supply | Qty: 180 | Fill #0

## 2016-11-28 ENCOUNTER — Other Ambulatory Visit: Payer: Self-pay | Admitting: Family Medicine

## 2016-11-28 MED FILL — ALBUTEROL 0.083% INHAL SOLN: (2.5 MG/3ML | 15 days supply | Qty: 150 | Fill #5

## 2016-11-28 MED FILL — VENTOLIN HFA 90 MCG INHALER: 108 (90 BAS | 17 days supply | Qty: 18 | Fill #3

## 2016-11-28 MED FILL — POTASSIUM CL ER 20 MEQ TABL: 20 | 90 days supply | Qty: 90 | Fill #3

## 2016-12-01 DIAGNOSIS — J449 Chronic obstructive pulmonary disease, unspecified: Secondary | ICD-10-CM | POA: Diagnosis not present

## 2016-12-03 MED FILL — SPIRIVA 18 MCG CP-HANDIHALE: 18 | 30 days supply | Qty: 30 | Fill #0

## 2016-12-17 ENCOUNTER — Other Ambulatory Visit: Payer: Self-pay | Admitting: Family Medicine

## 2016-12-18 MED FILL — ALBUTEROL 0.083% INHAL SOLN: (2.5 MG/3ML | 15 days supply | Qty: 150 | Fill #0 | Status: TO

## 2016-12-31 MED FILL — ALBUTEROL 0.083% INHAL SOLN: (2.5 MG/3ML | 15 days supply | Qty: 150 | Fill #1 | Status: TO

## 2016-12-31 MED FILL — SPIRIVA 18 MCG CP-HANDIHALE: 18 | 30 days supply | Qty: 30 | Fill #1

## 2017-01-01 DIAGNOSIS — J449 Chronic obstructive pulmonary disease, unspecified: Secondary | ICD-10-CM | POA: Diagnosis not present

## 2017-01-09 ENCOUNTER — Telehealth: Payer: Self-pay | Admitting: Family Medicine

## 2017-01-09 MED FILL — VENTOLIN HFA 90 MCG INHALER: 108 (90 BAS | 17 days supply | Qty: 18 | Fill #4

## 2017-01-09 NOTE — Telephone Encounter (Signed)
Last seen 08/17/16 for rash. Was suppose to follow up in one month. No upcoming appt

## 2017-01-09 NOTE — Telephone Encounter (Signed)
He may have prescription for 15 tablets, he needs an office visit for further tablets. Regular scheduled office visit does not have to be a work in

## 2017-01-09 NOTE — Telephone Encounter (Signed)
Requesting Rx for HYDROcodone-acetaminophen (NORCO) 10-325 MG tablet and nicotine patches.

## 2017-01-10 ENCOUNTER — Other Ambulatory Visit: Payer: Self-pay | Admitting: *Deleted

## 2017-01-10 MED ORDER — HYDROCODONE-ACETAMINOPHEN 10-325 MG PO TABS: 0.5000 | ORAL_TABLET | Freq: Four times a day (QID) | ORAL | 0 refills | Status: DC | PRN

## 2017-01-10 NOTE — Telephone Encounter (Signed)
Pt.notified

## 2017-01-10 NOTE — Telephone Encounter (Signed)
Left message return call 01/10/17

## 2017-01-15 ENCOUNTER — Telehealth: Payer: Self-pay | Admitting: *Deleted

## 2017-01-15 ENCOUNTER — Encounter: Payer: Self-pay | Admitting: Family Medicine

## 2017-01-15 ENCOUNTER — Ambulatory Visit (INDEPENDENT_AMBULATORY_CARE_PROVIDER_SITE_OTHER): Payer: 59 | Admitting: Family Medicine

## 2017-01-15 VITALS — BP 102/64 | Ht 67.0 in | Wt 127.0 lb

## 2017-01-15 DIAGNOSIS — Z23 Encounter for immunization: Secondary | ICD-10-CM

## 2017-01-15 DIAGNOSIS — Z79891 Long term (current) use of opiate analgesic: Secondary | ICD-10-CM | POA: Diagnosis not present

## 2017-01-15 DIAGNOSIS — R972 Elevated prostate specific antigen [PSA]: Secondary | ICD-10-CM

## 2017-01-15 DIAGNOSIS — J449 Chronic obstructive pulmonary disease, unspecified: Secondary | ICD-10-CM

## 2017-01-15 DIAGNOSIS — R0902 Hypoxemia: Secondary | ICD-10-CM

## 2017-01-15 DIAGNOSIS — R634 Abnormal weight loss: Secondary | ICD-10-CM | POA: Diagnosis not present

## 2017-01-15 MED ORDER — HYDROCODONE-ACETAMINOPHEN 10-325 MG PO TABS: 0.5000 | ORAL_TABLET | Freq: Four times a day (QID) | ORAL | 0 refills | Status: DC | PRN

## 2017-01-15 MED ORDER — CLONAZEPAM 0.5 MG PO TABS: 0.5000 mg | ORAL_TABLET | Freq: Two times a day (BID) | ORAL | 5 refills | Status: DC | PRN

## 2017-01-15 MED ORDER — NICOTINE 14 MG/24HR TD PT24: 14.0000 mg | MEDICATED_PATCH | Freq: Every day | TRANSDERMAL | 6 refills | Status: DC

## 2017-01-15 MED FILL — NICOTINE 14 MG/24HR PATCH: 14 | 28 days supply | Qty: 28 | Fill #0

## 2017-01-15 MED FILL — METOPROLOL SUCC ER 25 MG TA: 25 | 90 days supply | Qty: 90 | Fill #2

## 2017-01-15 NOTE — Progress Notes (Signed)
Subjective:    Patient ID: Tommy Turner, male    DOB: 1940/09/14, 76 y.o.   MRN: [id number]  HPI This patient was seen today for chronic pain. Has pain at the site of his hernia since having surgery  The medication list was reviewed and updated.   -Compliance with medication: yes  - Number patient states they take daily: one a day  -when was the last dose patient took? This morning  The patient was advised the importance of maintaining medication and not using illegal substances with these.  Refills needed: yes  The patient was educated that we can provide 3 monthly scripts for their medication, it is their responsibility to follow the instructions.  Side effects or complications from medications: none  Patient is aware that pain medications are meant to minimize the severity of the pain to allow their pain levels to improve to allow for better function. They are aware of that pain medications cannot totally remove their pain.  Due for UDT ( at least once per year) : due today  Needs refill on klonopin. Wants rx printed to carry to [facility].   Concerns about weight loss. Pt states he eats good. 3 meals a day.   This patient is eating well but losing weight which is common with COPD no hemoptysis no rectal bleeding no abdominal pain no chest pain No dizziness Does have COPD   Review of Systems  Constitutional: Negative for activity change, appetite change and fatigue.  HENT: Negative for congestion.   Respiratory: Positive for shortness of breath. Negative for cough, wheezing and stridor.   Cardiovascular: Negative for chest pain.  Gastrointestinal: Negative for abdominal pain.  Endocrine: Negative for polydipsia and polyphagia.  Genitourinary: Negative for difficulty urinating and dysuria.  Neurological: Negative for weakness.  Psychiatric/Behavioral: Negative for confusion.       Objective:   Physical Exam  Constitutional: No distress.  HENT:  Head:  Normocephalic and atraumatic.  Right Ear: External ear normal.  Left Ear: External ear normal.  Neck: No tracheal deviation present. No thyromegaly present.  Cardiovascular: Normal rate, regular rhythm and normal heart sounds.   No murmur heard. Pulmonary/Chest: Effort normal and breath sounds normal. No respiratory distress. He has no wheezes. He has no rales.  Abdominal: Soft. There is no tenderness. There is no rebound and no guarding.  Musculoskeletal: He exhibits no edema.  Lymphadenopathy:    He has no cervical adenopathy.  Neurological: He is alert.  Skin: Skin is warm and dry. No rash noted. He is not diaphoretic. No erythema.  Psychiatric: His behavior is normal.  Vitals reviewed.  Patient does have arthralgias in the back and hips his knees uses pain medicine no more than 2 per day  25 minutes was spent with the patient. Greater than half the time was spent in discussion and answering questions and counseling regarding the issues that the patient came in for today.      Assessment & Plan:  The patient was seen today as part of a comprehensive visit regarding pain control. Patient's compliance with the medication as well as discussion regarding effectiveness was completed. Prescriptions were written. Patient was advised to follow-up in 3 months. The patient was assessed for any signs of severe side effects. The patient was advised to take the medicine as directed and to report to [place] if any side effect issues.  COPD severe losing weight probably as a result of COPD check lab work await results Has a  history of elevated PSA we'll recheck this Maintained up needing CT scan of chest

## 2017-01-16 LAB — HEPATIC FUNCTION PANEL
ALBUMIN: 4.3 g/dL (ref 3.5–4.8)
ALT: 11 IU/L (ref 0–44)
AST: 20 IU/L (ref 0–40)
Alkaline Phosphatase: 102 IU/L (ref 39–117)
BILIRUBIN TOTAL: 0.5 mg/dL (ref 0.0–1.2)
Bilirubin, Direct: 0.17 mg/dL (ref 0.00–0.40)
TOTAL PROTEIN: 6.8 g/dL (ref 6.0–8.5)

## 2017-01-16 LAB — CBC WITH DIFFERENTIAL/PLATELET
Basophils Absolute: 0 10*3/uL (ref 0.0–0.2)
Basos: 0 %
EOS (ABSOLUTE): 0 10*3/uL (ref 0.0–0.4)
Eos: 1 %
Hematocrit: 44.5 % (ref 37.5–51.0)
Hemoglobin: 14.4 g/dL (ref 13.0–17.7)
Immature Grans (Abs): 0 10*3/uL (ref 0.0–0.1)
Immature Granulocytes: 0 %
Lymphocytes Absolute: 1.2 10*3/uL (ref 0.7–3.1)
Lymphs: 13 %
MCH: 29.4 pg (ref 26.6–33.0)
MCHC: 32.4 g/dL (ref 31.5–35.7)
MCV: 91 fL (ref 79–97)
MONOS ABS: 0.8 10*3/uL (ref 0.1–0.9)
Monocytes: 9 %
NEUTROS PCT: 77 %
Neutrophils Absolute: 6.9 10*3/uL (ref 1.4–7.0)
PLATELETS: 152 10*3/uL (ref 150–379)
RBC: 4.9 x10E6/uL (ref 4.14–5.80)
RDW: 15.1 % (ref 12.3–15.4)
WBC: 8.8 10*3/uL (ref 3.4–10.8)

## 2017-01-16 LAB — PSA: Prostate Specific Ag, Serum: 5.6 ng/mL — ABNORMAL HIGH (ref 0.0–4.0)

## 2017-01-16 LAB — BASIC METABOLIC PANEL
BUN/Creatinine Ratio: 13 (ref 10–24)
BUN: 8 mg/dL (ref 8–27)
CO2: 34 mmol/L — AB (ref 20–29)
Calcium: 9.5 mg/dL (ref 8.6–10.2)
Chloride: 93 mmol/L — ABNORMAL LOW (ref 96–106)
Creatinine, Ser: 0.63 mg/dL — ABNORMAL LOW (ref 0.76–1.27)
GFR calc Af Amer: 110 mL/min/{1.73_m2} (ref >59)
GFR, EST NON AFRICAN AMERICAN: 95 mL/min/{1.73_m2} (ref >59)
Glucose: 132 mg/dL — ABNORMAL HIGH (ref 65–99)
Potassium: 4.5 mmol/L (ref 3.5–5.2)
SODIUM: 143 mmol/L (ref 134–144)

## 2017-01-21 NOTE — Addendum Note (Signed)
Addended by: Launa Grill on: 01/21/2017 09:53 AM   Modules accepted: Orders

## 2017-01-23 ENCOUNTER — Encounter: Payer: Self-pay | Admitting: Family Medicine

## 2017-01-23 LAB — TOXASSURE SELECT 13 (MW), URINE

## 2017-01-23 LAB — PLEASE NOTE

## 2017-01-24 ENCOUNTER — Encounter: Payer: Self-pay | Admitting: Family Medicine

## 2017-01-31 MED FILL — ALBUTEROL 0.083% INHAL SOLN: (2.5 MG/3ML | 15 days supply | Qty: 150 | Fill #2 | Status: TO

## 2017-01-31 MED FILL — VENTOLIN HFA 90 MCG INHALER: 108 (90 BAS | 17 days supply | Qty: 18 | Fill #5

## 2017-01-31 MED FILL — SPIRIVA 18 MCG CP-HANDIHALE: 18 | 30 days supply | Qty: 30 | Fill #2

## 2017-02-01 DIAGNOSIS — J449 Chronic obstructive pulmonary disease, unspecified: Secondary | ICD-10-CM | POA: Diagnosis not present

## 2017-02-26 ENCOUNTER — Other Ambulatory Visit: Payer: Self-pay | Admitting: Family Medicine

## 2017-02-26 MED FILL — ALBUTEROL 0.083% INHAL SOLN: (2.5 MG/3ML | 15 days supply | Qty: 150 | Fill #3 | Status: TO

## 2017-02-26 MED FILL — SPIRIVA 18 MCG CP-HANDIHALE: 18 | 90 days supply | Qty: 90 | Fill #0

## 2017-02-26 MED FILL — POTASSIUM CL ER 20 MEQ TABL: 20 | 90 days supply | Qty: 90 | Fill #0

## 2017-02-27 ENCOUNTER — Encounter: Payer: Self-pay | Admitting: Family Medicine

## 2017-03-03 DIAGNOSIS — J449 Chronic obstructive pulmonary disease, unspecified: Secondary | ICD-10-CM | POA: Diagnosis not present

## 2017-03-04 ENCOUNTER — Telehealth: Payer: Self-pay | Admitting: Family Medicine

## 2017-03-04 DIAGNOSIS — Z029 Encounter for administrative examinations, unspecified: Secondary | ICD-10-CM

## 2017-03-04 NOTE — Telephone Encounter (Signed)
Patient dropped off FMLA to be filled out on husband.Please review,date,sign in your yellow folder.

## 2017-03-05 NOTE — Telephone Encounter (Signed)
This form was completed thank you 

## 2017-03-13 DIAGNOSIS — J449 Chronic obstructive pulmonary disease, unspecified: Secondary | ICD-10-CM | POA: Diagnosis not present

## 2017-03-13 DIAGNOSIS — J9611 Chronic respiratory failure with hypoxia: Secondary | ICD-10-CM | POA: Diagnosis not present

## 2017-03-13 DIAGNOSIS — I5022 Chronic systolic (congestive) heart failure: Secondary | ICD-10-CM | POA: Diagnosis not present

## 2017-04-03 DIAGNOSIS — J449 Chronic obstructive pulmonary disease, unspecified: Secondary | ICD-10-CM | POA: Diagnosis not present

## 2017-04-05 ENCOUNTER — Emergency Department (HOSPITAL_COMMUNITY): Payer: 59

## 2017-04-05 ENCOUNTER — Encounter (HOSPITAL_COMMUNITY): Payer: Self-pay

## 2017-04-05 ENCOUNTER — Inpatient Hospital Stay (HOSPITAL_COMMUNITY)
Admission: EM | Admit: 2017-04-05 | Discharge: 2017-04-11 | DRG: 329 | Disposition: A | Discharge status: Home / Self Care | Payer: 59 | Attending: Internal Medicine | Admitting: Internal Medicine

## 2017-04-05 ENCOUNTER — Other Ambulatory Visit: Payer: Self-pay

## 2017-04-05 DIAGNOSIS — G894 Chronic pain syndrome: Secondary | ICD-10-CM | POA: Diagnosis present

## 2017-04-05 DIAGNOSIS — J9622 Acute and chronic respiratory failure with hypercapnia: Secondary | ICD-10-CM | POA: Diagnosis not present

## 2017-04-05 DIAGNOSIS — K4030 Unilateral inguinal hernia, with obstruction, without gangrene, not specified as recurrent: Secondary | ICD-10-CM | POA: Diagnosis not present

## 2017-04-05 DIAGNOSIS — D696 Thrombocytopenia, unspecified: Secondary | ICD-10-CM | POA: Diagnosis present

## 2017-04-05 DIAGNOSIS — I5023 Acute on chronic systolic (congestive) heart failure: Secondary | ICD-10-CM | POA: Diagnosis not present

## 2017-04-05 DIAGNOSIS — J449 Chronic obstructive pulmonary disease, unspecified: Secondary | ICD-10-CM | POA: Diagnosis present

## 2017-04-05 DIAGNOSIS — K4031 Unilateral inguinal hernia, with obstruction, without gangrene, recurrent: Secondary | ICD-10-CM | POA: Diagnosis not present

## 2017-04-05 DIAGNOSIS — K4190 Unilateral femoral hernia, without obstruction or gangrene, not specified as recurrent: Secondary | ICD-10-CM | POA: Diagnosis present

## 2017-04-05 DIAGNOSIS — Z9981 Dependence on supplemental oxygen: Secondary | ICD-10-CM | POA: Diagnosis not present

## 2017-04-05 DIAGNOSIS — F419 Anxiety disorder, unspecified: Secondary | ICD-10-CM | POA: Diagnosis present

## 2017-04-05 DIAGNOSIS — I5022 Chronic systolic (congestive) heart failure: Secondary | ICD-10-CM | POA: Diagnosis not present

## 2017-04-05 DIAGNOSIS — Z66 Do not resuscitate: Secondary | ICD-10-CM | POA: Diagnosis present

## 2017-04-05 DIAGNOSIS — R1030 Lower abdominal pain, unspecified: Secondary | ICD-10-CM | POA: Diagnosis not present

## 2017-04-05 DIAGNOSIS — R0902 Hypoxemia: Secondary | ICD-10-CM | POA: Diagnosis not present

## 2017-04-05 DIAGNOSIS — Z8249 Family history of ischemic heart disease and other diseases of the circulatory system: Secondary | ICD-10-CM

## 2017-04-05 DIAGNOSIS — Z833 Family history of diabetes mellitus: Secondary | ICD-10-CM

## 2017-04-05 DIAGNOSIS — J9611 Chronic respiratory failure with hypoxia: Secondary | ICD-10-CM | POA: Diagnosis present

## 2017-04-05 DIAGNOSIS — Z7982 Long term (current) use of aspirin: Secondary | ICD-10-CM

## 2017-04-05 DIAGNOSIS — Z825 Family history of asthma and other chronic lower respiratory diseases: Secondary | ICD-10-CM

## 2017-04-05 DIAGNOSIS — I2510 Atherosclerotic heart disease of native coronary artery without angina pectoris: Secondary | ICD-10-CM | POA: Diagnosis present

## 2017-04-05 DIAGNOSIS — Z8711 Personal history of peptic ulcer disease: Secondary | ICD-10-CM

## 2017-04-05 DIAGNOSIS — Z0189 Encounter for other specified special examinations: Secondary | ICD-10-CM

## 2017-04-05 DIAGNOSIS — Z681 Body mass index (BMI) 19 or less, adult: Secondary | ICD-10-CM

## 2017-04-05 DIAGNOSIS — K4090 Unilateral inguinal hernia, without obstruction or gangrene, not specified as recurrent: Secondary | ICD-10-CM | POA: Diagnosis not present

## 2017-04-05 DIAGNOSIS — J9612 Chronic respiratory failure with hypercapnia: Secondary | ICD-10-CM

## 2017-04-05 DIAGNOSIS — Z951 Presence of aortocoronary bypass graft: Secondary | ICD-10-CM

## 2017-04-05 DIAGNOSIS — J90 Pleural effusion, not elsewhere classified: Secondary | ICD-10-CM

## 2017-04-05 DIAGNOSIS — I10 Essential (primary) hypertension: Secondary | ICD-10-CM | POA: Diagnosis not present

## 2017-04-05 DIAGNOSIS — K660 Peritoneal adhesions (postprocedural) (postinfection): Secondary | ICD-10-CM | POA: Diagnosis present

## 2017-04-05 DIAGNOSIS — Z87891 Personal history of nicotine dependence: Secondary | ICD-10-CM

## 2017-04-05 DIAGNOSIS — K56609 Unspecified intestinal obstruction, unspecified as to partial versus complete obstruction: Secondary | ICD-10-CM | POA: Diagnosis not present

## 2017-04-05 DIAGNOSIS — E440 Moderate protein-calorie malnutrition: Secondary | ICD-10-CM | POA: Diagnosis not present

## 2017-04-05 DIAGNOSIS — I340 Nonrheumatic mitral (valve) insufficiency: Secondary | ICD-10-CM | POA: Diagnosis present

## 2017-04-05 DIAGNOSIS — Z8679 Personal history of other diseases of the circulatory system: Secondary | ICD-10-CM

## 2017-04-05 DIAGNOSIS — E785 Hyperlipidemia, unspecified: Secondary | ICD-10-CM | POA: Diagnosis present

## 2017-04-05 DIAGNOSIS — Z4659 Encounter for fitting and adjustment of other gastrointestinal appliance and device: Secondary | ICD-10-CM | POA: Diagnosis not present

## 2017-04-05 DIAGNOSIS — N400 Enlarged prostate without lower urinary tract symptoms: Secondary | ICD-10-CM | POA: Diagnosis present

## 2017-04-05 DIAGNOSIS — Z809 Family history of malignant neoplasm, unspecified: Secondary | ICD-10-CM

## 2017-04-05 DIAGNOSIS — I110 Hypertensive heart disease with heart failure: Secondary | ICD-10-CM | POA: Diagnosis present

## 2017-04-05 DIAGNOSIS — J9621 Acute and chronic respiratory failure with hypoxia: Secondary | ICD-10-CM | POA: Diagnosis not present

## 2017-04-05 DIAGNOSIS — I252 Old myocardial infarction: Secondary | ICD-10-CM

## 2017-04-05 DIAGNOSIS — F329 Major depressive disorder, single episode, unspecified: Secondary | ICD-10-CM | POA: Diagnosis present

## 2017-04-05 DIAGNOSIS — I255 Ischemic cardiomyopathy: Secondary | ICD-10-CM | POA: Diagnosis present

## 2017-04-05 MED ORDER — IOPAMIDOL (ISOVUE-300) INJECTION 61%
100.0000 mL | Freq: Once | INTRAVENOUS | Status: AC | PRN
  Administered 2017-04-06: 100 mL via INTRAVENOUS

## 2017-04-05 NOTE — ED Provider Notes (Signed)
[facility] EMERGENCY DEPARTMENT Provider Note   CSN: [id number] Arrival date & time: 04/05/17  2109     History   Chief Complaint Chief Complaint  Patient presents with  . Groin Pain    HPI MALACHI KINZLER is a 76 y.o. male.  Patient with multiple medical problems including COPD on home oxygen, CHF, CAD status post bypass, aortic aneurysm repair 2 years ago presenting with intermittent left groin pain since yesterday.  States the pain comes and goes lasting for several minutes to hours at a time.  Denies any vomiting or diarrhea.  Last bowel movement was today.  Patient is concerned that he could be hitting this area with his oxygen tank but denies any direct trauma.  Denies any pain with urination or blood in the urine.  Denies any testicular pain or back pain.  No chest pain or change in his chronic shortness of breath.  Did have a left inguinal hernia repair many years ago.  76 y/o male who has multiple medical issues including COPD on home O2, HTN, CAD, CHF with EF 20% on ECHO in 2017, ischemic cardiomyopathy, prior endovascular AAA repair who presents with pain in his left groin and nausea   The history is provided by the patient and a relative.    Past Medical History:  Diagnosis Date  . AAA (abdominal aortic aneurysm) ([place])   . Aneurysm ([place])   . Anxiety    takes Xanax daily as needed  . CAD (coronary artery disease)   . CHF (congestive heart failure) ([place])   . COPD (chronic obstructive pulmonary disease) ([facility])    Albuterol inhaler and neb daily as needed;takes SPiriva daily  . Depression    takes Celexa daily  . Foley catheter in place   . History of shingles   . Hyperlipidemia    takes Atorvastatin daily  . Hypertension    takes Metoprolol and Benazepril  daily  . Myocardial infarction ([place])    20+yrs ago  . Oxygen dependent    2L/[place]   . Peripheral edema    takes Furosemide daily  . Shortness of breath     on oxygen  2 liters continuous  . Ulcer    gastric    Patient Active Problem List   Diagnosis Date Noted  . Senile purpura ([place]) 06/18/2016  . Cachexia ([place]) 06/18/2016  . Palliative care encounter   . Goals of care, counseling/discussion   . DNR (do not resuscitate) discussion   . Acute encephalopathy 04/07/2016  . Anemia 04/05/2016  . Thrombocytopenia ([place]) 04/05/2016  . Fever 04/05/2016  . Elevated troponin 04/05/2016  . CAP (community acquired pneumonia) 04/04/2016  . Sepsis ([place]) 04/04/2016  . BPH (benign prostatic hypertrophy) with urinary retention 07/01/2015  . Hypotension 05/24/2015  . Systolic CHF, chronic ([place]) 05/24/2015  . Protein-calorie malnutrition, severe 05/04/2015  . Acute on chronic respiratory failure with hypoxia ([place]) 05/03/2015  . Chronic systolic CHF (congestive heart failure) ([place]) 05/03/2015  . Leg swelling- Bilateral leg / foot 10/21/2014  . Abdominal aortic aneurysm without rupture ([place]) 09/14/2014  . Dyspnea 06/03/2014  . Elevated PSA 03/09/2014  . Peripheral vascular disease, unspecified ([place]) 01/22/2014  . Chronic pain syndrome 10/27/2013  . Hyperglycemia 04/09/2013  . Acute respiratory failure with hypoxia ([place]) 04/09/2013  . COPD with acute exacerbation ([place]) 04/05/2013  . COPD exacerbation ([place]) 04/05/2013  . Loss of weight 03/18/2013  . Early satiety 03/18/2013  . Dysphagia, unspecified(787.20) 03/18/2013  . COPD with hypoxia ([place]) 10/31/2012  .  Hyperlipemia 10/31/2012  . CAD (coronary artery disease) 10/31/2012  . Anxiety 10/31/2012  . Abdominal aortic aneurysm ([place]) 02/02/2011  . TOBACCO ABUSE 09/20/2009  . PEPTIC ULCER DISEASE 09/20/2009  . Essential hypertension 09/19/2009  . BRONCHITIS, CHRONIC 09/19/2009    Past Surgical History:  Procedure Laterality Date  . ABDOMINAL AORTIC ENDOVASCULAR STENT GRAFT N/A 09/14/2014   Procedure: ABDOMINAL AORTIC ENDOVASCULAR STENT GRAFT;  Surgeon: Conrad [place], MD;  Location: [facility];  Service: Vascular;  Laterality: N/A;  . CORONARY  ARTERY BYPASS GRAFT  20+yrs ago   x 6  . ESOPHAGOGASTRODUODENOSCOPY    . HERNIA REPAIR Left    inguinal  . LIPOMA EXCISION     right lower back  . PARTIAL GASTRECTOMY     approx 1990  . TONSILLECTOMY     as a child  . TRANSURETHRAL RESECTION OF PROSTATE N/A 11/24/2015   Procedure: TRANSURETHRAL RESECTION OF THE PROSTATE (TURP);  Surgeon: Irine Seal, MD;  Location: [facility];  Service: Urology;  Laterality: N/A;       Home Medications    Prior to Admission medications   Medication Sig Start Date End Date Taking? Authorizing Provider  albuterol (PROVENTIL HFA;VENTOLIN HFA) 108 (90 Base) MCG/ACT inhaler Inhale 2 puffs into the lungs every 4 (four) hours as needed for wheezing or shortness of breath. 07/27/16  Yes Luking, Scott A, MD  albuterol (PROVENTIL) (2.5 MG/3ML) 0.083% nebulizer solution USE 1 VIAL BY NEBULIZATION EVERY 2 (TWO) HOURS AS NEEDED FOR WHEEZING OR SHORTNESS OF BREATH 12/18/16  Yes [name], MD  aspirin EC 81 MG tablet Take 81 mg by mouth daily.    Yes [provider]  clonazePAM (KLONOPIN) 0.5 MG tablet Take 1 tablet (0.5 mg total) by mouth 2 (two) times daily as needed. for anxiety 01/15/17  Yes Luking, Elayne Snare, MD  HYDROcodone-acetaminophen (NORCO) 10-325 MG tablet Take 0.5 tablets by mouth every 6 (six) hours as needed for severe pain. 01/15/17  Yes Luking, Elayne Snare, MD  metoprolol succinate (TOPROL-XL) 25 MG 24 hr tablet TAKE 1/2 TABLET BY MOUTH 2 TIMES DAILY. Patient taking differently: TAKE 1/2 TABLET BY MOUTH DAILY. 04/03/16  Yes Luking, Elayne Snare, MD  nitroGLYCERIN (NITROSTAT) 0.4 MG SL tablet Place 1 tablet (0.4 mg total) under the tongue every 5 (five) minutes as needed for chest pain. 08/02/15  Yes [name], MD  potassium chloride SA (K-DUR,KLOR-CON) 20 MEQ tablet TAKE 1 TABLET BY MOUTH ONCE DAILY Patient taking differently: TAKE 1 TABLET BY MOUTH ONCE EVERY OTHER DAY 02/26/17  Yes [name], MD  SPIRIVA HANDIHALER 18 MCG inhalation capsule  PLACE 1 CAPSULE INTO INHALER AND INHALE DAILY 02/26/17  Yes Luking, Scott A, MD  torsemide (DEMADEX) 20 MG tablet Take 1 tablet (20 mg total) by mouth daily. Patient taking differently: Take 20 mg by mouth every other day.  05/22/16  Yes Luking, Elayne Snare, MD  nicotine (NICODERM CQ) 14 mg/24hr patch Place 1 patch (14 mg total) onto the skin daily. Patient not taking: Reported on 04/05/2017 01/15/17   [name], MD    Family History Family History  Problem Relation Age of Onset  . Hypertension Brother   . Heart disease Brother        Heart Disease before age 64  . Heart attack Brother   . Cancer Brother   . Cancer Mother   . Diabetes Mother   . Heart disease Mother        Heart Disease before  age 66  . Hypertension Mother   . Heart attack Mother   . Cancer Father        prostate  . Hypertension Father   . COPD Sister   . Diabetes Sister   . Heart disease Sister   . Hypertension Sister   . Heart attack Sister   . Colon cancer Neg Hx     Social History Social History   Tobacco Use  . Smoking status: Former Smoker    Packs/day: 0.25    Years: 58.00    Pack years: 14.50    Types: Cigarettes    Last attempt to quit: 11/02/2014    Years since quitting: 2.4  . Smokeless tobacco: Never Used  . Tobacco comment: vapor ciggs  Substance Use Topics  . Alcohol use: No    Alcohol/week: 0.0 oz  . Drug use: No     Allergies   Patient has no known allergies.   Review of Systems Review of Systems  Constitutional: Negative for activity change, appetite change and fever.  HENT: Negative for congestion and rhinorrhea.   Eyes: Negative for visual disturbance.  Respiratory: Negative for cough, chest tightness and shortness of breath.   Cardiovascular: Negative for chest pain.  Gastrointestinal: Positive for abdominal pain. Negative for nausea and vomiting.  Genitourinary: Negative for dysuria, flank pain, hematuria, penile swelling and testicular pain.  Musculoskeletal:  Negative for back pain.  Skin: Negative for wound.  Neurological: Negative for dizziness, weakness and numbness.    all other systems are negative except as noted in the HPI and PMH.    Physical Exam Updated Vital Signs BP 120/66 (BP Location: Left Arm)   Pulse 74   Temp 98.3 F (36.8 C) (Oral)   Resp (!) 22   Ht 5\' 7"  (1.702 m)   Wt 57.6 kg (127 lb)   SpO2 98%   BMI 19.89 kg/m   Physical Exam  Constitutional: He is oriented to person, place, and time. He appears well-developed and well-nourished. No distress.  HENT:  Head: Normocephalic and atraumatic.  Mouth/Throat: Oropharynx is clear and moist. No oropharyngeal exudate.  Eyes: Conjunctivae and EOM are normal. Pupils are equal, round, and reactive to light.  Neck: Normal range of motion. Neck supple.  No meningismus.  Cardiovascular: Normal rate, regular rhythm, normal heart sounds and intact distal pulses.  No murmur heard. Pulmonary/Chest: Effort normal and breath sounds normal. No respiratory distress.  Diminished breath sounds bilaterally  Abdominal: Soft. There is tenderness. There is no rebound and no guarding.  There is a left inguinal mass that is tender to palpation, it is not reducible.  Well-healed midline incision.  Abdomen is soft and nontender  Genitourinary:  Genitourinary Comments: Testicles are nontender  Musculoskeletal: Normal range of motion. He exhibits no edema or tenderness.  Neurological: He is alert and oriented to person, place, and time. No cranial nerve deficit. He exhibits normal muscle tone. Coordination normal.  No ataxia on finger to nose bilaterally. No pronator drift. 5/5 strength throughout. CN 2-12 intact.Equal grip strength. Sensation intact.   Skin: Skin is warm.  Psychiatric: He has a normal mood and affect. His behavior is normal.  Nursing note and vitals reviewed.    ED Treatments / Results  Labs (all labs ordered are listed, but only abnormal results are displayed) Labs  Reviewed  CBC WITH DIFFERENTIAL/PLATELET - Abnormal; Notable for the following components:      Result Value   RBC 3.97 (*)    Hemoglobin 11.9 (*)  Platelets 140 (*)    All other components within normal limits  COMPREHENSIVE METABOLIC PANEL - Abnormal; Notable for the following components:   Chloride 100 (*)    CO2 38 (*)    Glucose, Bld 102 (*)    Creatinine, Ser 0.37 (*)    Total Protein 6.1 (*)    AST 14 (*)    ALT 7 (*)    Anion gap 4 (*)    All other components within normal limits  I-STAT CG4 LACTIC ACID, ED - Abnormal; Notable for the following components:   Lactic Acid, Venous 0.47 (*)    All other components within normal limits  URINE CULTURE  URINALYSIS, ROUTINE W REFLEX MICROSCOPIC  LIPASE, BLOOD  GLUCOSE, CAPILLARY  CBC WITH DIFFERENTIAL/PLATELET  CBC WITH DIFFERENTIAL/PLATELET  LACTIC ACID, PLASMA    EKG  EKG Interpretation None       Radiology Ct Abdomen Pelvis W Contrast  Result Date: 04/06/2017 CLINICAL DATA:  Dull aching pain in the left groin EXAM: CT ABDOMEN AND PELVIS WITH CONTRAST TECHNIQUE: Multidetector CT imaging of the abdomen and pelvis was performed using the standard protocol following bolus administration of intravenous contrast. CONTRAST:  133mL ISOVUE-300 IOPAMIDOL (ISOVUE-300) INJECTION 61% COMPARISON:  10/21/2014 FINDINGS: Lower chest: Lung bases demonstrate patchy atelectasis in the left lower echo. Small left-sided pleural effusion. Cardiomegaly. Partially visualized sternotomy changes. Hepatobiliary: Small calcified stones in the gallbladder. No biliary dilatation. No focal hepatic abnormality Pancreas: Unremarkable. No pancreatic ductal dilatation or surrounding inflammatory changes. Spleen: Normal in size without focal abnormality. Adrenals/Urinary Tract: Adrenal glands are within normal limits. 6 mm stone in the mid pole of the left kidney. Small stones in the lower pole of the right kidney. Subcentimeter cortical hypodense lesions too  small to further characterize but probably cysts. Hyperdense 6 mm lesion superior pole right kidney, series 2, image number 22. The urinary bladder is unremarkable Stomach/Bowel: The stomach is nonenlarged. Postsurgical changes at the GE junction and distal stomach. Dilated loops of small bowel in the left lower quadrant measuring up to 3.8 cm. There appears to be a transition point related to a small left inguinal hernia, series 2, image number 65. Bowel distal to this is nonenlarged. No colon wall thickening. Vascular/Lymphatic: Atherosclerotic vascular disease. Patient is status post aorto iliac stent with patent appearing graft. Further decrease in size of the infrarenal abdominal aortic aneurysm measuring 3.2 cm AP, compared to 5.1 cm previously. No significantly enlarged abdominal or pelvic lymph nodes. Reproductive: Slightly enlarged prostate with calcifications Other: Negative for free air or significant free pelvic fluid. Musculoskeletal: Degenerative changes of the spine. No acute or suspicious bone lesion. IMPRESSION: 1. Dilated fluid-filled loops of small bowel in the left lower quadrant with suspected transition point related to a small left inguinal hernia. 2. Status post aorto iliac bypass graft with further decrease in size of the previously noted infrarenal aortic aneurysm. 3. Gallstones 4. Nonobstructing stones within both kidneys. Electronically Signed   By: Donavan Foil M.D.   On: 04/06/2017 01:33   Dg Chest Portable 1 View  Result Date: 04/06/2017 CLINICAL DATA:  NG tube placement EXAM: PORTABLE CHEST 1 VIEW COMPARISON:  04/17/2016 FINDINGS: Post sternotomy changes. Esophageal tube tip poorly visible but tubing seen to the mid stomach. Small left pleural effusion. Improving aeration at the left base. Cardiomegaly with vascular congestion. Tiny right effusion. No pneumothorax. IMPRESSION: 1. Esophageal tube being visualized to the mid stomach 2. Small pleural effusions with improving  aeration at the left base.  3. Cardiomegaly with vascular congestion Electronically Signed   By: Donavan Foil M.D.   On: 04/06/2017 03:09    Procedures Procedures (including critical care time)  Medications Ordered in ED Medications - No data to display   Initial Impression / Assessment and Plan / ED Course  I have reviewed the triage vital signs and the nursing notes.  Pertinent labs & imaging results that were available during my care of the patient were reviewed by me and considered in my medical decision making (see chart for details).       Patient presents with intermittent left inguinal pain since yesterday.  He appears to have an incarcerated hernia on exam.  It is not reducible.  Testicles are nontender.  CT findings concerning for incarcerated inguinal hernia with small bowel obstruction.  Discussed with Dr. Constance Haw of general surgery.  Patient is a poor candidate for operation any pain given his EF of 20%, chronic oxygen status.  She feels he will need to be transferred to [facility].  She agrees with IV fluids and NG decompression overnight.  Does not recommend any further reduction attempts.  Dr. Constance Haw was able to reduce hernia at bedside.  NG tube in place.  Patient given IV fluids.  Labs are reassuring.  Lactate is normal.  Given his comorbidities, he will need to be transferred to [facility] for possible surgical intervention.  Discussed with Dr. Rosendo Gros of surgery at [facility]. Admission to hospitalist service at [facility] d/w Dr. Myna Hidalgo.  CRITICAL CARE Performed by: Ezequiel Essex Total critical care time: 35 minutes Critical care time was exclusive of separately billable procedures and treating other patients. Critical care was necessary to treat or prevent imminent or life-threatening deterioration. Critical care was time spent personally by me on the following activities: development of treatment plan with patient and/or surrogate as well as nursing, discussions with  consultants, evaluation of patient's response to treatment, examination of patient, obtaining history from patient or surrogate, ordering and performing treatments and interventions, ordering and review of laboratory studies, ordering and review of radiographic studies, pulse oximetry and re-evaluation of patient's condition.  Final Clinical Impressions(s) / ED Diagnoses   Final diagnoses:  Incarcerated umbilical hernia  Small bowel obstruction [facility])    ED Discharge Orders    None       Ezequiel Essex, MD 04/06/17 [phone]

## 2017-04-05 NOTE — ED Triage Notes (Signed)
Pain in left groin since yesterday.  Pt denies vomiting or diarrhea. Pt describes pain as dull and intermittent.

## 2017-04-06 ENCOUNTER — Encounter (HOSPITAL_COMMUNITY): Payer: Self-pay | Admitting: Family Medicine

## 2017-04-06 ENCOUNTER — Inpatient Hospital Stay (HOSPITAL_COMMUNITY): Payer: 59

## 2017-04-06 DIAGNOSIS — Z9981 Dependence on supplemental oxygen: Secondary | ICD-10-CM | POA: Diagnosis not present

## 2017-04-06 DIAGNOSIS — K8020 Calculus of gallbladder without cholecystitis without obstruction: Secondary | ICD-10-CM | POA: Diagnosis not present

## 2017-04-06 DIAGNOSIS — G894 Chronic pain syndrome: Secondary | ICD-10-CM

## 2017-04-06 DIAGNOSIS — Z452 Encounter for adjustment and management of vascular access device: Secondary | ICD-10-CM | POA: Diagnosis not present

## 2017-04-06 DIAGNOSIS — R0902 Hypoxemia: Secondary | ICD-10-CM | POA: Diagnosis not present

## 2017-04-06 DIAGNOSIS — I2510 Atherosclerotic heart disease of native coronary artery without angina pectoris: Secondary | ICD-10-CM | POA: Diagnosis not present

## 2017-04-06 DIAGNOSIS — K4131 Unilateral femoral hernia, with obstruction, without gangrene, recurrent: Secondary | ICD-10-CM | POA: Diagnosis not present

## 2017-04-06 DIAGNOSIS — N200 Calculus of kidney: Secondary | ICD-10-CM | POA: Diagnosis not present

## 2017-04-06 DIAGNOSIS — Z7982 Long term (current) use of aspirin: Secondary | ICD-10-CM | POA: Diagnosis not present

## 2017-04-06 DIAGNOSIS — K5710 Diverticulosis of small intestine without perforation or abscess without bleeding: Secondary | ICD-10-CM | POA: Diagnosis not present

## 2017-04-06 DIAGNOSIS — G8918 Other acute postprocedural pain: Secondary | ICD-10-CM | POA: Diagnosis not present

## 2017-04-06 DIAGNOSIS — I5023 Acute on chronic systolic (congestive) heart failure: Secondary | ICD-10-CM | POA: Diagnosis not present

## 2017-04-06 DIAGNOSIS — J449 Chronic obstructive pulmonary disease, unspecified: Secondary | ICD-10-CM | POA: Diagnosis not present

## 2017-04-06 DIAGNOSIS — K4190 Unilateral femoral hernia, without obstruction or gangrene, not specified as recurrent: Secondary | ICD-10-CM | POA: Diagnosis not present

## 2017-04-06 DIAGNOSIS — J9611 Chronic respiratory failure with hypoxia: Secondary | ICD-10-CM | POA: Diagnosis not present

## 2017-04-06 DIAGNOSIS — Z825 Family history of asthma and other chronic lower respiratory diseases: Secondary | ICD-10-CM | POA: Diagnosis not present

## 2017-04-06 DIAGNOSIS — F419 Anxiety disorder, unspecified: Secondary | ICD-10-CM | POA: Diagnosis not present

## 2017-04-06 DIAGNOSIS — F329 Major depressive disorder, single episode, unspecified: Secondary | ICD-10-CM | POA: Diagnosis not present

## 2017-04-06 DIAGNOSIS — I5022 Chronic systolic (congestive) heart failure: Secondary | ICD-10-CM | POA: Diagnosis not present

## 2017-04-06 DIAGNOSIS — Z833 Family history of diabetes mellitus: Secondary | ICD-10-CM | POA: Diagnosis not present

## 2017-04-06 DIAGNOSIS — E440 Moderate protein-calorie malnutrition: Secondary | ICD-10-CM | POA: Diagnosis not present

## 2017-04-06 DIAGNOSIS — I10 Essential (primary) hypertension: Secondary | ICD-10-CM | POA: Diagnosis not present

## 2017-04-06 DIAGNOSIS — J9621 Acute and chronic respiratory failure with hypoxia: Secondary | ICD-10-CM | POA: Diagnosis not present

## 2017-04-06 DIAGNOSIS — I517 Cardiomegaly: Secondary | ICD-10-CM | POA: Diagnosis not present

## 2017-04-06 DIAGNOSIS — I110 Hypertensive heart disease with heart failure: Secondary | ICD-10-CM | POA: Diagnosis not present

## 2017-04-06 DIAGNOSIS — Z8679 Personal history of other diseases of the circulatory system: Secondary | ICD-10-CM | POA: Diagnosis not present

## 2017-04-06 DIAGNOSIS — J9612 Chronic respiratory failure with hypercapnia: Secondary | ICD-10-CM | POA: Diagnosis not present

## 2017-04-06 DIAGNOSIS — K55059 Acute (reversible) ischemia of intestine, part and extent unspecified: Secondary | ICD-10-CM | POA: Diagnosis not present

## 2017-04-06 DIAGNOSIS — D696 Thrombocytopenia, unspecified: Secondary | ICD-10-CM

## 2017-04-06 DIAGNOSIS — E785 Hyperlipidemia, unspecified: Secondary | ICD-10-CM | POA: Diagnosis not present

## 2017-04-06 DIAGNOSIS — Z681 Body mass index (BMI) 19 or less, adult: Secondary | ICD-10-CM | POA: Diagnosis not present

## 2017-04-06 DIAGNOSIS — K56600 Partial intestinal obstruction, unspecified as to cause: Secondary | ICD-10-CM | POA: Diagnosis not present

## 2017-04-06 DIAGNOSIS — K4031 Unilateral inguinal hernia, with obstruction, without gangrene, recurrent: Secondary | ICD-10-CM | POA: Diagnosis not present

## 2017-04-06 DIAGNOSIS — K56609 Unspecified intestinal obstruction, unspecified as to partial versus complete obstruction: Secondary | ICD-10-CM | POA: Diagnosis not present

## 2017-04-06 DIAGNOSIS — K4030 Unilateral inguinal hernia, with obstruction, without gangrene, not specified as recurrent: Secondary | ICD-10-CM | POA: Diagnosis present

## 2017-04-06 DIAGNOSIS — Z4682 Encounter for fitting and adjustment of non-vascular catheter: Secondary | ICD-10-CM | POA: Diagnosis not present

## 2017-04-06 DIAGNOSIS — J90 Pleural effusion, not elsewhere classified: Secondary | ICD-10-CM | POA: Diagnosis not present

## 2017-04-06 DIAGNOSIS — Z809 Family history of malignant neoplasm, unspecified: Secondary | ICD-10-CM | POA: Diagnosis not present

## 2017-04-06 DIAGNOSIS — Z951 Presence of aortocoronary bypass graft: Secondary | ICD-10-CM | POA: Diagnosis not present

## 2017-04-06 DIAGNOSIS — R1030 Lower abdominal pain, unspecified: Secondary | ICD-10-CM | POA: Diagnosis present

## 2017-04-06 DIAGNOSIS — I255 Ischemic cardiomyopathy: Secondary | ICD-10-CM | POA: Diagnosis not present

## 2017-04-06 DIAGNOSIS — J9622 Acute and chronic respiratory failure with hypercapnia: Secondary | ICD-10-CM | POA: Diagnosis not present

## 2017-04-06 DIAGNOSIS — Z0189 Encounter for other specified special examinations: Secondary | ICD-10-CM | POA: Diagnosis not present

## 2017-04-06 DIAGNOSIS — I252 Old myocardial infarction: Secondary | ICD-10-CM | POA: Diagnosis not present

## 2017-04-06 DIAGNOSIS — Z8249 Family history of ischemic heart disease and other diseases of the circulatory system: Secondary | ICD-10-CM | POA: Diagnosis not present

## 2017-04-06 LAB — CBC WITH DIFFERENTIAL/PLATELET
BASOS PCT: 0 %
Basophils Absolute: 0 10*3/uL (ref 0.0–0.1)
Basophils Absolute: 0 10*3/uL (ref 0.0–0.1)
Basophils Relative: 0 %
EOS ABS: 0 10*3/uL (ref 0.0–0.7)
EOS PCT: 0 %
Eosinophils Absolute: 0 10*3/uL (ref 0.0–0.7)
Eosinophils Relative: 0 %
HCT: 38.6 % — ABNORMAL LOW (ref 39.0–52.0)
HEMATOCRIT: 39.1 % (ref 39.0–52.0)
HEMOGLOBIN: 11.8 g/dL — AB (ref 13.0–17.0)
HEMOGLOBIN: 11.9 g/dL — AB (ref 13.0–17.0)
LYMPHS ABS: 1 10*3/uL (ref 0.7–4.0)
LYMPHS ABS: 1.3 10*3/uL (ref 0.7–4.0)
Lymphocytes Relative: 16 %
Lymphocytes Relative: 9 %
MCH: 29.9 pg (ref 26.0–34.0)
MCH: 30 pg (ref 26.0–34.0)
MCHC: 30.6 g/dL (ref 30.0–36.0)
MCHC: 30.4 g/dL (ref 30.0–36.0)
MCV: 97.7 fL (ref 78.0–100.0)
MCV: 98.5 fL (ref 78.0–100.0)
MONOS PCT: 8 %
Monocytes Absolute: 0.6 10*3/uL (ref 0.1–1.0)
Monocytes Absolute: 1 10*3/uL (ref 0.1–1.0)
Monocytes Relative: 9 %
NEUTROS ABS: 6 10*3/uL (ref 1.7–7.7)
NEUTROS PCT: 82 %
NEUTROS PCT: 76 %
Neutro Abs: 9.7 10*3/uL — ABNORMAL HIGH (ref 1.7–7.7)
PLATELETS: 125 10*3/uL — AB (ref 150–400)
Platelets: 140 10*3/uL — ABNORMAL LOW (ref 150–400)
RBC: 3.95 MIL/uL — AB (ref 4.22–5.81)
RBC: 3.97 MIL/uL — AB (ref 4.22–5.81)
RDW: 13.2 % (ref 11.5–15.5)
RDW: 13.2 % (ref 11.5–15.5)
WBC: 11.7 10*3/uL — AB (ref 4.0–10.5)
WBC: 8 10*3/uL (ref 4.0–10.5)

## 2017-04-06 LAB — COMPREHENSIVE METABOLIC PANEL
ALBUMIN: 3.6 g/dL (ref 3.5–5.0)
ALK PHOS: 68 U/L (ref 38–126)
ALT: 7 U/L — AB (ref 17–63)
ANION GAP: 4 — AB (ref 5–15)
AST: 14 U/L — ABNORMAL LOW (ref 15–41)
BILIRUBIN TOTAL: 0.6 mg/dL (ref 0.3–1.2)
BUN: 16 mg/dL (ref 6–20)
CHLORIDE: 100 mmol/L — AB (ref 101–111)
CO2: 38 mmol/L — AB (ref 22–32)
Calcium: 9.1 mg/dL (ref 8.9–10.3)
Creatinine, Ser: 0.37 mg/dL — ABNORMAL LOW (ref 0.61–1.24)
GFR calc Af Amer: >60 mL/min (ref >60)
GFR calc non Af Amer: >60 mL/min (ref >60)
GLUCOSE: 102 mg/dL — AB (ref 65–99)
Potassium: 4 mmol/L (ref 3.5–5.1)
Sodium: 142 mmol/L (ref 135–145)
Total Protein: 6.1 g/dL — ABNORMAL LOW (ref 6.5–8.1)

## 2017-04-06 LAB — URINALYSIS, ROUTINE W REFLEX MICROSCOPIC
Bilirubin Urine: NEGATIVE
Glucose, UA: NEGATIVE mg/dL
Hgb urine dipstick: NEGATIVE
Ketones, ur: NEGATIVE mg/dL
LEUKOCYTES UA: NEGATIVE
NITRITE: NEGATIVE
PH: 6 (ref 5.0–8.0)
Protein, ur: NEGATIVE mg/dL
SPECIFIC GRAVITY, URINE: 1.023 (ref 1.005–1.030)

## 2017-04-06 LAB — I-STAT CG4 LACTIC ACID, ED: LACTIC ACID, VENOUS: 0.47 mmol/L — AB (ref 0.5–1.9)

## 2017-04-06 LAB — LACTIC ACID, PLASMA: LACTIC ACID, VENOUS: 0.7 mmol/L (ref 0.5–1.9)

## 2017-04-06 LAB — GLUCOSE, CAPILLARY: GLUCOSE-CAPILLARY: 87 mg/dL (ref 65–99)

## 2017-04-06 LAB — LIPASE, BLOOD: Lipase: 24 U/L (ref 11–51)

## 2017-04-06 MED ORDER — ENOXAPARIN SODIUM 40 MG/0.4ML ~~LOC~~ SOLN
40.0000 mg | SUBCUTANEOUS | Status: DC
  Administered 2017-04-06 – 2017-04-10 (×5): 40 mg via SUBCUTANEOUS
  Filled 2017-04-06 (×5): qty 0.4

## 2017-04-06 MED ORDER — ALBUTEROL SULFATE (2.5 MG/3ML) 0.083% IN NEBU
2.5000 mg | INHALATION_SOLUTION | RESPIRATORY_TRACT | Status: DC | PRN
  Administered 2017-04-06 – 2017-04-08 (×3): 2.5 mg via RESPIRATORY_TRACT
  Filled 2017-04-06 (×3): qty 3

## 2017-04-06 MED ORDER — CLONAZEPAM 0.5 MG PO TABS: 0.5000 mg | ORAL_TABLET | Freq: Two times a day (BID) | ORAL | Status: DC | PRN

## 2017-04-06 MED ORDER — KCL IN DEXTROSE-NACL 10-5-0.45 MEQ/L-%-% IV SOLN
INTRAVENOUS | Status: AC
  Administered 2017-04-06: 06:00:00 via INTRAVENOUS
  Filled 2017-04-06: qty 1000

## 2017-04-06 MED ORDER — TIOTROPIUM BROMIDE MONOHYDRATE 18 MCG IN CAPS
18.0000 ug | ORAL_CAPSULE | Freq: Every day | RESPIRATORY_TRACT | Status: DC
  Administered 2017-04-06 – 2017-04-11 (×5): 18 ug via RESPIRATORY_TRACT
  Filled 2017-04-06 (×2): qty 5

## 2017-04-06 MED ORDER — ACETAMINOPHEN 325 MG PO TABS: 650.0000 mg | ORAL_TABLET | Freq: Four times a day (QID) | ORAL | Status: DC | PRN

## 2017-04-06 MED ORDER — LORAZEPAM 2 MG/ML IJ SOLN
1.0000 mg | Freq: Once | INTRAMUSCULAR | Status: AC
  Administered 2017-04-06: 1 mg via INTRAVENOUS
  Filled 2017-04-06: qty 1

## 2017-04-06 MED ORDER — KCL IN DEXTROSE-NACL 10-5-0.45 MEQ/L-%-% IV SOLN
INTRAVENOUS | Status: AC
  Administered 2017-04-06: 19:00:00 via INTRAVENOUS
  Filled 2017-04-06: qty 1000

## 2017-04-06 MED ORDER — SODIUM CHLORIDE 0.9 % IV SOLN: INTRAVENOUS | Status: DC

## 2017-04-06 MED ORDER — ACETAMINOPHEN 650 MG RE SUPP: 650.0000 mg | Freq: Four times a day (QID) | RECTAL | Status: DC | PRN

## 2017-04-06 MED ORDER — FENTANYL CITRATE (PF) 100 MCG/2ML IJ SOLN
25.0000 ug | INTRAMUSCULAR | Status: DC | PRN
  Administered 2017-04-06 – 2017-04-07 (×4): 25 ug via INTRAVENOUS
  Filled 2017-04-06 (×4): qty 2

## 2017-04-06 MED ORDER — ONDANSETRON HCL 4 MG/2ML IJ SOLN
4.0000 mg | Freq: Once | INTRAMUSCULAR | Status: AC
  Administered 2017-04-06: 4 mg via INTRAVENOUS
  Filled 2017-04-06: qty 2

## 2017-04-06 MED ORDER — FENTANYL CITRATE (PF) 100 MCG/2ML IJ SOLN
50.0000 ug | Freq: Once | INTRAMUSCULAR | Status: AC
  Administered 2017-04-06: 50 ug via INTRAVENOUS
  Filled 2017-04-06: qty 2

## 2017-04-06 NOTE — Progress Notes (Signed)
PROGRESS NOTE  JERAD DUNLAP IRJ:[id number] DOB: 18-Sep-1940 DOA: 04/05/2017 PCP: [name], MD  HPI/Recap of past 24 hours: Tommy Turner is a 76 y.o. male with medical history significant for AAA status post repair, chronic systolic CHF with EF 30% (2017), CAD, ischemic CM, COPD, chronic respiratory failure, anxiety disorder, and chronic pain, now presenting to the emergency department for evaluation of left groin pain 04/05/17. Onset 2 days ago. Had a left inguinal hernia repair approximately 10 years ago. Left inguinal Hernia was incarcerated then reduced at the bedside by general surgery at [facility].  CT of the abdomen and pelvis 04/06/17 demonstrates dilated loops of small bowel in the left lower quadrant with transition point related to the small left inguinal hernia.  NG tube was placed and the patient was treated with IV fluids, fentanyl, and Zofran. General surgery following.     Assessment/Plan: Principal Problem:   Incarcerated inguinal hernia, unilateral Active Problems:   Essential hypertension   COPD with hypoxia (HCC)   CAD (coronary artery disease)   Anxiety   Chronic pain syndrome   Chronic respiratory failure with hypoxia and hypercapnia (HCC)   Chronic systolic CHF (congestive heart failure) (HCC)   Thrombocytopenia (HCC)   Incarcerated inguinal hernia  Acute left inguinal hernia incarceration, resolved -Reduced by general surgery early this am -general surgery following. -We appreciate gen surgery recommendations -pain management in place -norco 10-325 prn for severe pain -NG tube in place -Plan is to avoid surgery -closely monitor symptoms  Chronic systolic heart failure with EF 20% (04/2016) -appears stable, in no acute exacerbation -asa, metoprolol, torsemide -no statin in home med list  Ischemic cardiomyopathy -management as stated above  S/p endovascular repair AAA -no acute issues  COPD, no acute  exacerbation -stable -proventin, spiriva  Tobacco use disorder -nicotine patch -tobacco cessation counseling  Anxiety -stable -klonopin  Code Status: DNR  Family Communication: Wife at bedside. All questions answered to her satisfaction.  Disposition Plan: Will stay another midnight to closely monitor symptoms and left inguinal hernia.   Consultants:  General surgery  Procedures:  Left inguinal hernia manual reduction  Antimicrobials:  None indicated  DVT prophylaxis:  lovenox 40 mg sq daily   Objective: Vitals:   04/06/17 0854 04/06/17 1053 04/06/17 1104 04/06/17 1332  BP:    (!) 101/55  Pulse: 84  77 99  Resp: 18  16 16   Temp:    98.2 F (36.8 C)  TempSrc:    Oral  SpO2: (!) 89% 90% 95% 99%  Weight:      Height:        Intake/Output Summary (Last 24 hours) at 04/06/2017 1424 Last data filed at 04/06/2017 0600 Gross per 24 hour  Intake 0 ml  Output 0 ml  Net 0 ml   Filed Weights   04/05/17 2120 04/06/17 0614  Weight: 57.6 kg (127 lb) 59.8 kg (131 lb 13.4 oz)    Exam:   General:  76 yo CM thin built laying in bed in NAD. Somnolent  Cardiovascular: RRR no rubs or gallops  Respiratory: CTA no wheezes or rales  Abdomen: soft NT ND NBSx 4 quadrants  Musculoskeletal: left inguinal region is tender on palpation  Skin: No noted rashes  Psychiatry: Mood is appropriate for condition and setting   Data Reviewed: CBC: Recent Labs  Lab 04/05/17 2342 04/06/17 0712  WBC 8.0 11.7*  NEUTROABS 6.0 9.7*  HGB 11.9* 11.8*  HCT 39.1 38.6*  MCV 98.5  97.7  PLT 140* 086*   Basic Metabolic Panel: Recent Labs  Lab 04/05/17 2342  NA 142  K 4.0  CL 100*  CO2 38*  GLUCOSE 102*  BUN 16  CREATININE 0.37*  CALCIUM 9.1   GFR: Estimated Creatinine Clearance: 65.4 mL/min (A) (by C-G formula based on SCr of 0.37 mg/dL (L)). Liver Function Tests: Recent Labs  Lab 04/05/17 2342  AST 14*  ALT 7*  ALKPHOS 68  BILITOT 0.6  PROT 6.1*  ALBUMIN  3.6   Recent Labs  Lab 04/05/17 2342  LIPASE 24   No results for input(s): AMMONIA in the last 168 hours. Coagulation Profile: No results for input(s): INR, PROTIME in the last 168 hours. Cardiac Enzymes: No results for input(s): CKTOTAL, CKMB, CKMBINDEX, TROPONINI in the last 168 hours. BNP (last 3 results) No results for input(s): PROBNP in the last 8760 hours. HbA1C: No results for input(s): HGBA1C in the last 72 hours. CBG: Recent Labs  Lab 04/06/17 0610  GLUCAP 87   Lipid Profile: No results for input(s): CHOL, HDL, LDLCALC, TRIG, CHOLHDL, LDLDIRECT in the last 72 hours. Thyroid Function Tests: No results for input(s): TSH, T4TOTAL, FREET4, T3FREE, THYROIDAB in the last 72 hours. Anemia Panel: No results for input(s): VITAMINB12, FOLATE, FERRITIN, TIBC, IRON, RETICCTPCT in the last 72 hours. Urine analysis:    Component Value Date/Time   COLORURINE YELLOW 04/06/2017 0025   APPEARANCEUR CLEAR 04/06/2017 0025   LABSPEC 1.023 04/06/2017 0025   PHURINE 6.0 04/06/2017 0025   GLUCOSEU NEGATIVE 04/06/2017 0025   HGBUR NEGATIVE 04/06/2017 0025   BILIRUBINUR NEGATIVE 04/06/2017 0025   KETONESUR NEGATIVE 04/06/2017 0025   PROTEINUR NEGATIVE 04/06/2017 0025   UROBILINOGEN 1.0 09/10/2014 0919   NITRITE NEGATIVE 04/06/2017 0025   LEUKOCYTESUR NEGATIVE 04/06/2017 0025   Sepsis Labs: @LABRCNTIP (procalcitonin:4,lacticidven:4)  )No results found for this or any previous visit (from the past 240 hour(s)).    Studies: Ct Abdomen Pelvis W Contrast  Result Date: 04/06/2017 CLINICAL DATA:  Dull aching pain in the left groin EXAM: CT ABDOMEN AND PELVIS WITH CONTRAST TECHNIQUE: Multidetector CT imaging of the abdomen and pelvis was performed using the standard protocol following bolus administration of intravenous contrast. CONTRAST:  151mL ISOVUE-300 IOPAMIDOL (ISOVUE-300) INJECTION 61% COMPARISON:  10/21/2014 FINDINGS: Lower chest: Lung bases demonstrate patchy atelectasis in the  left lower echo. Small left-sided pleural effusion. Cardiomegaly. Partially visualized sternotomy changes. Hepatobiliary: Small calcified stones in the gallbladder. No biliary dilatation. No focal hepatic abnormality Pancreas: Unremarkable. No pancreatic ductal dilatation or surrounding inflammatory changes. Spleen: Normal in size without focal abnormality. Adrenals/Urinary Tract: Adrenal glands are within normal limits. 6 mm stone in the mid pole of the left kidney. Small stones in the lower pole of the right kidney. Subcentimeter cortical hypodense lesions too small to further characterize but probably cysts. Hyperdense 6 mm lesion superior pole right kidney, series 2, image number 22. The urinary bladder is unremarkable Stomach/Bowel: The stomach is nonenlarged. Postsurgical changes at the GE junction and distal stomach. Dilated loops of small bowel in the left lower quadrant measuring up to 3.8 cm. There appears to be a transition point related to a small left inguinal hernia, series 2, image number 65. Bowel distal to this is nonenlarged. No colon wall thickening. Vascular/Lymphatic: Atherosclerotic vascular disease. Patient is status post aorto iliac stent with patent appearing graft. Further decrease in size of the infrarenal abdominal aortic aneurysm measuring 3.2 cm AP, compared to 5.1 cm previously. No significantly enlarged abdominal or pelvic lymph  nodes. Reproductive: Slightly enlarged prostate with calcifications Other: Negative for free air or significant free pelvic fluid. Musculoskeletal: Degenerative changes of the spine. No acute or suspicious bone lesion. IMPRESSION: 1. Dilated fluid-filled loops of small bowel in the left lower quadrant with suspected transition point related to a small left inguinal hernia. 2. Status post aorto iliac bypass graft with further decrease in size of the previously noted infrarenal aortic aneurysm. 3. Gallstones 4. Nonobstructing stones within both kidneys.  Electronically Signed   By: Donavan Foil M.D.   On: 04/06/2017 01:33   Dg Chest Portable 1 View  Result Date: 04/06/2017 CLINICAL DATA:  NG tube placement EXAM: PORTABLE CHEST 1 VIEW COMPARISON:  04/17/2016 FINDINGS: Post sternotomy changes. Esophageal tube tip poorly visible but tubing seen to the mid stomach. Small left pleural effusion. Improving aeration at the left base. Cardiomegaly with vascular congestion. Tiny right effusion. No pneumothorax. IMPRESSION: 1. Esophageal tube being visualized to the mid stomach 2. Small pleural effusions with improving aeration at the left base. 3. Cardiomegaly with vascular congestion Electronically Signed   By: Donavan Foil M.D.   On: 04/06/2017 03:09    Scheduled Meds: . tiotropium  18 mcg Inhalation Daily    Continuous Infusions: . dextrose 5 % and 0.45 % NaCl with KCl 10 mEq/L 100 mL/hr at 04/06/17 0606     LOS: 0 days     Kayleen Memos, MD [facility] Pager [phone]  If 7PM-7AM, please contact night-coverage [email or web address] Password [email or web address] 04/06/2017, 2:24 PM

## 2017-04-06 NOTE — Consult Note (Signed)
Reason for Consult: Left inguinal hernia, small bowel obstruction Referring Physician: Dr. Alfonse Spruce Tommy Turner is an 76 y.o. male.  HPI: Tommy Turner is a 76 yo male who has multiple medical issues including CODP on home O2, HTN, CAD, CHF with EF 20% on ECHO in 2017, ischemic cardiomyopathy, prior endovascular AAA repair who presents with pain in his left groin and nausea. The patient had the hernia repaired per his family report years ago (procedure note from 2005 where plug and patch were used).    According to the patient's wife he has had some decreased by mouth intake recently. She states this bit of the last 2 days. She states he's had no nausea or vomiting. Secondary to anorexia patient was seen outside hospital.  Upon evaluation in the ER patient was seen to have a small bowel obstruction resulting from a left incarcerated inguinal hernia. This was reduced at the bedside by the general surgeon. Patient was transferred for further evaluation and management to [facility].   Past Medical History:  Diagnosis Date  . AAA (abdominal aortic aneurysm) ([place])   . Aneurysm ([place])   . Anxiety    takes Xanax daily as needed  . CAD (coronary artery disease)   . CHF (congestive heart failure) ([place])   . COPD (chronic obstructive pulmonary disease) (HCC)    Albuterol inhaler and neb daily as needed;takes SPiriva daily  . Depression    takes Celexa daily  . Foley catheter in place   . History of shingles   . Hyperlipidemia    takes Atorvastatin daily  . Hypertension    takes Metoprolol and Benazepril  daily  . Myocardial infarction ([place])    20+yrs ago  . Oxygen dependent    2L/[place]   . Peripheral edema    takes Furosemide daily  . Shortness of breath     on oxygen  2 liters continuous  . Ulcer    gastric    Past Surgical History:  Procedure Laterality Date  . ABDOMINAL AORTIC ENDOVASCULAR STENT GRAFT N/A 09/14/2014   Procedure: ABDOMINAL AORTIC ENDOVASCULAR STENT GRAFT;   Surgeon: Conrad Indiana, MD;  Location: [facility];  Service: Vascular;  Laterality: N/A;  . CORONARY ARTERY BYPASS GRAFT  20+yrs ago   x 6  . ESOPHAGOGASTRODUODENOSCOPY    . HERNIA REPAIR Left    inguinal  . LIPOMA EXCISION     right lower back  . PARTIAL GASTRECTOMY     approx 1990  . TONSILLECTOMY     as a child  . TRANSURETHRAL RESECTION OF PROSTATE N/A 11/24/2015   Procedure: TRANSURETHRAL RESECTION OF THE PROSTATE (TURP);  Surgeon: Irine Seal, MD;  Location: [facility];  Service: Urology;  Laterality: N/A;    Family History  Problem Relation Age of Onset  . Hypertension Brother   . Heart disease Brother        Heart Disease before age 4  . Heart attack Brother   . Cancer Brother   . Cancer Mother   . Diabetes Mother   . Heart disease Mother        Heart Disease before age 50  . Hypertension Mother   . Heart attack Mother   . Cancer Father        prostate  . Hypertension Father   . COPD Sister   . Diabetes Sister   . Heart disease Sister   . Hypertension Sister   . Heart attack Sister   . Colon cancer  Neg Hx     Social History:  reports that he quit smoking about 2 years ago. His smoking use included cigarettes. He has a 14.50 pack-year smoking history. he has never used smokeless tobacco. He reports that he does not drink alcohol or use drugs.  Allergies: No Known Allergies  Medications: I have reviewed the patient's current medications.  Results for orders placed or performed during the hospital encounter of 04/05/17 (from the past 48 hour(s))  CBC with Differential/Platelet     Status: Abnormal   Collection Time: 04/05/17 11:42 PM  Result Value Ref Range   WBC 8.0 4.0 - 10.5 K/uL   RBC 3.97 (L) 4.22 - 5.81 MIL/uL   Hemoglobin 11.9 (L) 13.0 - 17.0 g/dL   HCT 39.1 39.0 - 52.0 %   MCV 98.5 78.0 - 100.0 fL   MCH 30.0 26.0 - 34.0 pg   MCHC 30.4 30.0 - 36.0 g/dL   RDW 13.2 11.5 - 15.5 %   Platelets 140 (L) 150 - 400 K/uL   Neutrophils Relative % 76 %   Neutro Abs  6.0 1.7 - 7.7 K/uL   Lymphocytes Relative 16 %   Lymphs Abs 1.3 0.7 - 4.0 K/uL   Monocytes Relative 8 %   Monocytes Absolute 0.6 0.1 - 1.0 K/uL   Eosinophils Relative 0 %   Eosinophils Absolute 0.0 0.0 - 0.7 K/uL   Basophils Relative 0 %   Basophils Absolute 0.0 0.0 - 0.1 K/uL  Comprehensive metabolic panel     Status: Abnormal   Collection Time: 04/05/17 11:42 PM  Result Value Ref Range   Sodium 142 135 - 145 mmol/L   Potassium 4.0 3.5 - 5.1 mmol/L   Chloride 100 (L) 101 - 111 mmol/L   CO2 38 (H) 22 - 32 mmol/L   Glucose, Bld 102 (H) 65 - 99 mg/dL   BUN 16 6 - 20 mg/dL   Creatinine, Ser 0.37 (L) 0.61 - 1.24 mg/dL   Calcium 9.1 8.9 - 10.3 mg/dL   Total Protein 6.1 (L) 6.5 - 8.1 g/dL   Albumin 3.6 3.5 - 5.0 g/dL   AST 14 (L) 15 - 41 U/L   ALT 7 (L) 17 - 63 U/L   Alkaline Phosphatase 68 38 - 126 U/L   Total Bilirubin 0.6 0.3 - 1.2 mg/dL   GFR calc non Af Amer >60 >60 mL/min   GFR calc Af Amer >60 >60 mL/min    Comment: (NOTE) The eGFR has been calculated using the CKD EPI equation. This calculation has not been validated in all clinical situations. eGFR's persistently <60 mL/min signify possible Chronic Kidney Disease.    Anion gap 4 (L) 5 - 15  Lipase, blood     Status: None   Collection Time: 04/05/17 11:42 PM  Result Value Ref Range   Lipase 24 11 - 51 U/L  I-Stat CG4 Lactic Acid, ED     Status: Abnormal   Collection Time: 04/06/17 12:03 AM  Result Value Ref Range   Lactic Acid, Venous 0.47 (L) 0.5 - 1.9 mmol/L  Urinalysis, Routine w reflex microscopic     Status: None   Collection Time: 04/06/17 12:25 AM  Result Value Ref Range   Color, Urine YELLOW YELLOW   APPearance CLEAR CLEAR   Specific Gravity, Urine 1.023 1.005 - 1.030   pH 6.0 5.0 - 8.0   Glucose, UA NEGATIVE NEGATIVE mg/dL   Hgb urine dipstick NEGATIVE NEGATIVE   Bilirubin Urine NEGATIVE NEGATIVE   Ketones,  ur NEGATIVE NEGATIVE mg/dL   Protein, ur NEGATIVE NEGATIVE mg/dL   Nitrite NEGATIVE NEGATIVE    Leukocytes, UA NEGATIVE NEGATIVE  Glucose, capillary     Status: None   Collection Time: 04/06/17  6:10 AM  Result Value Ref Range   Glucose-Capillary 87 65 - 99 mg/dL    Ct Abdomen Pelvis W Contrast  Result Date: 04/06/2017 CLINICAL DATA:  Dull aching pain in the left groin EXAM: CT ABDOMEN AND PELVIS WITH CONTRAST TECHNIQUE: Multidetector CT imaging of the abdomen and pelvis was performed using the standard protocol following bolus administration of intravenous contrast. CONTRAST:  118m ISOVUE-300 IOPAMIDOL (ISOVUE-300) INJECTION 61% COMPARISON:  10/21/2014 FINDINGS: Lower chest: Lung bases demonstrate patchy atelectasis in the left lower echo. Small left-sided pleural effusion. Cardiomegaly. Partially visualized sternotomy changes. Hepatobiliary: Small calcified stones in the gallbladder. No biliary dilatation. No focal hepatic abnormality Pancreas: Unremarkable. No pancreatic ductal dilatation or surrounding inflammatory changes. Spleen: Normal in size without focal abnormality. Adrenals/Urinary Tract: Adrenal glands are within normal limits. 6 mm stone in the mid pole of the left kidney. Small stones in the lower pole of the right kidney. Subcentimeter cortical hypodense lesions too small to further characterize but probably cysts. Hyperdense 6 mm lesion superior pole right kidney, series 2, image number 22. The urinary bladder is unremarkable Stomach/Bowel: The stomach is nonenlarged. Postsurgical changes at the GE junction and distal stomach. Dilated loops of small bowel in the left lower quadrant measuring up to 3.8 cm. There appears to be a transition point related to a small left inguinal hernia, series 2, image number 65. Bowel distal to this is nonenlarged. No colon wall thickening. Vascular/Lymphatic: Atherosclerotic vascular disease. Patient is status post aorto iliac stent with patent appearing graft. Further decrease in size of the infrarenal abdominal aortic aneurysm measuring 3.2 cm AP,  compared to 5.1 cm previously. No significantly enlarged abdominal or pelvic lymph nodes. Reproductive: Slightly enlarged prostate with calcifications Other: Negative for free air or significant free pelvic fluid. Musculoskeletal: Degenerative changes of the spine. No acute or suspicious bone lesion. IMPRESSION: 1. Dilated fluid-filled loops of small bowel in the left lower quadrant with suspected transition point related to a small left inguinal hernia. 2. Status post aorto iliac bypass graft with further decrease in size of the previously noted infrarenal aortic aneurysm. 3. Gallstones 4. Nonobstructing stones within both kidneys. Electronically Signed   By: KDonavan FoilM.D.   On: 04/06/2017 01:33   Dg Chest Portable 1 View  Result Date: 04/06/2017 CLINICAL DATA:  NG tube placement EXAM: PORTABLE CHEST 1 VIEW COMPARISON:  04/17/2016 FINDINGS: Post sternotomy changes. Esophageal tube tip poorly visible but tubing seen to the mid stomach. Small left pleural effusion. Improving aeration at the left base. Cardiomegaly with vascular congestion. Tiny right effusion. No pneumothorax. IMPRESSION: 1. Esophageal tube being visualized to the mid stomach 2. Small pleural effusions with improving aeration at the left base. 3. Cardiomegaly with vascular congestion Electronically Signed   By: KDonavan FoilM.D.   On: 04/06/2017 03:09    Review of Systems  Constitutional: Negative for chills, fever and malaise/fatigue.  HENT: Negative for ear discharge, hearing loss and sore throat.   Eyes: Negative for blurred vision and discharge.  Respiratory: Negative for cough and shortness of breath.   Cardiovascular: Negative for chest pain, orthopnea and leg swelling.  Gastrointestinal: Positive for abdominal pain. Negative for constipation, diarrhea, heartburn, nausea and vomiting.  Musculoskeletal: Negative for myalgias and neck pain.  Skin: Negative for  itching and rash.  Neurological: Negative for dizziness, focal  weakness, seizures and loss of consciousness.  Endo/Heme/Allergies: Negative for environmental allergies. Does not bruise/bleed easily.  Psychiatric/Behavioral: Negative for depression and suicidal ideas.  All other systems reviewed and are negative.  Blood pressure (!) 99/59, pulse 83, temperature 97.8 F (36.6 C), temperature source Axillary, resp. rate 16, height '5\' 7"'  (1.702 m), weight 59.8 kg (131 lb 13.4 oz), SpO2 91 %. Physical Exam  Constitutional: He is oriented to person, place, and time. Vital signs are normal. He appears well-developed and well-nourished.  Conversant No acute distress  Eyes: Lids are normal. No scleral icterus.  No lid lag Moist conjunctiva  Neck: No tracheal tenderness present. No thyromegaly present.  No cervical lymphadenopathy  Cardiovascular: Normal rate, regular rhythm and intact distal pulses.  No murmur heard. Respiratory: Effort normal and breath sounds normal. He has no wheezes. He has no rales.  GI: Soft. There is no hepatosplenomegaly. There is no tenderness. There is no rebound and no guarding. A hernia is present. Hernia confirmed positive in the left inguinal area (current hernia is seen, some tenderness to palpation to the left inguinal area.). Hernia confirmed negative in the right inguinal area.  Neurological: He is alert and oriented to person, place, and time.  Normal gait and station  Skin: Skin is warm. No rash noted. No cyanosis. Nails show no clubbing.  Normal skin turgor  Psychiatric: Judgment normal.  Appropriate affect    Assessment/Plan: 76 year old male with a left inguinal hernia that had been incarcerated. This was reduced at the bedside. Patient has multiple medical issues that would possibly preclude him from surgery. At this time he has no bowel incarceration/granulation. It would be reasonable to follow his clinical progression to possibly avoid surgery. He has minimal abdominal pain currently however has more left inguinal  pain.  We will repeat labs and follow his abdominal exam.  Rosario Jacks., [facility] 04/06/2017, 6:51 AM

## 2017-04-06 NOTE — H&P (Signed)
History and Physical    Tommy Turner OHY:[id number] DOB: Dec 21, 1940 DOA: 04/05/2017  PCP: [name], MD   Patient coming from: Home  Chief Complaint: Left groin pain   HPI: Tommy Turner is a 76 y.o. male with medical history significant for AAA status post repair, chronic systolic CHF, COPD, chronic respiratory failure, anxiety disorder, and chronic pain, now presenting to the emergency department for evaluation of left groin pain.  The patient reports that he had been in his usual state of health until noting pain in the left groin that began 2 days ago and has progressively worsened.  He describes it as intermittent, worse with certain movements, much worse with palpation, dull, and burning.  There has not been any associated vomiting or diarrhea, and the patient denies fevers or chills. Reports a normal BM yesterday. States that he had a left inguinal hernia repair approximately 20 years ago.   ED Course: Upon arrival to the ED, patient is found to be afebrile, saturating low 90s on the supplemental oxygen, and with vitals otherwise stable.  Chemistry panel is notable for bicarbonate of 38, similar to priors, and CBC features a chronic mild thrombocytopenia with platelets 140,000.  Lactic acid is reassuring at 0.47.  CT of the abdomen and pelvis demonstrates dilated loops of small bowel in the left lower quadrant with transition point related to the small left inguinal hernia.  NG tube was placed and the patient was treated with IV fluids, fentanyl, and Zofran in the ED.  Surgery was consulted by the ED physician and transfer to [facility] was advised given the patient's significant comorbidity.  General surgeon at [facility] has accepted the patient in consultation, patient has remained hemodynamically stable and in no apparent respiratory distress, and he will be admitted to the medical/surgical unit at [facility] for ongoing evaluation and management of  incarcerated left inguinal hernia.  Review of Systems:  All other systems reviewed and apart from HPI, are negative.  Past Medical History:  Diagnosis Date  . AAA (abdominal aortic aneurysm) ([place])   . Aneurysm ([place])   . Anxiety    takes Xanax daily as needed  . CAD (coronary artery disease)   . CHF (congestive heart failure) ([place])   . COPD (chronic obstructive pulmonary disease) ([facility])    Albuterol inhaler and neb daily as needed;takes SPiriva daily  . Depression    takes Celexa daily  . Foley catheter in place   . History of shingles   . Hyperlipidemia    takes Atorvastatin daily  . Hypertension    takes Metoprolol and Benazepril  daily  . Myocardial infarction ([place])    20+yrs ago  . Oxygen dependent    2L/[place]   . Peripheral edema    takes Furosemide daily  . Shortness of breath     on oxygen  2 liters continuous  . Ulcer    gastric    Past Surgical History:  Procedure Laterality Date  . ABDOMINAL AORTIC ENDOVASCULAR STENT GRAFT N/A 09/14/2014   Procedure: ABDOMINAL AORTIC ENDOVASCULAR STENT GRAFT;  Surgeon: Conrad Footville, MD;  Location: [place];  Service: Vascular;  Laterality: N/A;  . CORONARY ARTERY BYPASS GRAFT  20+yrs ago   x 6  . ESOPHAGOGASTRODUODENOSCOPY    . HERNIA REPAIR Left    inguinal  . LIPOMA EXCISION     right lower back  . PARTIAL GASTRECTOMY     approx 1990  . TONSILLECTOMY  as a child  . TRANSURETHRAL RESECTION OF PROSTATE N/A 11/24/2015   Procedure: TRANSURETHRAL RESECTION OF THE PROSTATE (TURP);  Surgeon: Irine Seal, MD;  Location: [facility];  Service: Urology;  Laterality: N/A;     reports that he quit smoking about 2 years ago. His smoking use included cigarettes. He has a 14.50 pack-year smoking history. he has never used smokeless tobacco. He reports that he does not drink alcohol or use drugs.  No Known Allergies  Family History  Problem Relation Age of Onset  . Hypertension Brother   . Heart disease Brother        Heart Disease before  age 43  . Heart attack Brother   . Cancer Brother   . Cancer Mother   . Diabetes Mother   . Heart disease Mother        Heart Disease before age 53  . Hypertension Mother   . Heart attack Mother   . Cancer Father        prostate  . Hypertension Father   . COPD Sister   . Diabetes Sister   . Heart disease Sister   . Hypertension Sister   . Heart attack Sister   . Colon cancer Neg Hx      Prior to Admission medications   Medication Sig Start Date End Date Taking? Authorizing Provider  albuterol (PROVENTIL HFA;VENTOLIN HFA) 108 (90 Base) MCG/ACT inhaler Inhale 2 puffs into the lungs every 4 (four) hours as needed for wheezing or shortness of breath. 07/27/16  Yes Luking, Scott A, MD  albuterol (PROVENTIL) (2.5 MG/3ML) 0.083% nebulizer solution USE 1 VIAL BY NEBULIZATION EVERY 2 (TWO) HOURS AS NEEDED FOR WHEEZING OR SHORTNESS OF BREATH 12/18/16  Yes [name], MD  aspirin EC 81 MG tablet Take 81 mg by mouth daily.    Yes [provider]  clonazePAM (KLONOPIN) 0.5 MG tablet Take 1 tablet (0.5 mg total) by mouth 2 (two) times daily as needed. for anxiety 01/15/17  Yes Luking, Elayne Snare, MD  HYDROcodone-acetaminophen (NORCO) 10-325 MG tablet Take 0.5 tablets by mouth every 6 (six) hours as needed for severe pain. 01/15/17  Yes Luking, Elayne Snare, MD  metoprolol succinate (TOPROL-XL) 25 MG 24 hr tablet TAKE 1/2 TABLET BY MOUTH 2 TIMES DAILY. Patient taking differently: TAKE 1/2 TABLET BY MOUTH DAILY. 04/03/16  Yes Luking, Elayne Snare, MD  nitroGLYCERIN (NITROSTAT) 0.4 MG SL tablet Place 1 tablet (0.4 mg total) under the tongue every 5 (five) minutes as needed for chest pain. 08/02/15  Yes [name], MD  potassium chloride SA (K-DUR,KLOR-CON) 20 MEQ tablet TAKE 1 TABLET BY MOUTH ONCE DAILY Patient taking differently: TAKE 1 TABLET BY MOUTH ONCE EVERY OTHER DAY 02/26/17  Yes [name], MD  SPIRIVA HANDIHALER 18 MCG inhalation capsule PLACE 1 CAPSULE INTO INHALER AND INHALE DAILY  02/26/17  Yes Luking, Scott A, MD  torsemide (DEMADEX) 20 MG tablet Take 1 tablet (20 mg total) by mouth daily. Patient taking differently: Take 20 mg by mouth every other day.  05/22/16  Yes Luking, Elayne Snare, MD  nicotine (NICODERM CQ) 14 mg/24hr patch Place 1 patch (14 mg total) onto the skin daily. Patient not taking: Reported on 04/05/2017 01/15/17   [name], MD    Physical Exam: Vitals:   04/05/17 2119 04/05/17 2120 04/06/17 0030  BP: 120/66  122/70  Pulse: 74  70  Resp: (!) 22  20  Temp: 98.3 F (36.8 C)    TempSrc:  Oral    SpO2: 98%  93%  Weight:  57.6 kg (127 lb)   Height:  5\' 7"  (1.702 m)       Constitutional: NAD, calm, cachectic, in obvious discomfort Eyes: PERTLA, lids and conjunctivae normal ENMT: Mucous membranes are moist. Posterior pharynx clear of any exudate or lesions.   Neck: normal, supple, no masses, no thyromegaly Respiratory: Breath sounds diminished bilaterally, no wheezing, no crackles. Normal respiratory effort.  Cardiovascular: S1 & S2 heard, regular rate and rhythm. No extremity edema.  No significant JVD. Abdomen: No distension, soft, small soft exquisitely tender mass in left groin without erythema. Bowel sounds appreciated.  Musculoskeletal: no clubbing / cyanosis. No joint deformity upper and lower extremities.    Skin: no significant rashes, lesions, ulcers. Warm, dry, well-perfused. Neurologic: CN 2-12 grossly intact. Sensation intact. Strength 5/5 in all 4 limbs.  Psychiatric: Alert and oriented x 3. Calm, cooperative.     Labs on Admission: I have personally reviewed following labs and imaging studies  CBC: Recent Labs  Lab 04/05/17 2342  WBC 8.0  NEUTROABS 6.0  HGB 11.9*  HCT 39.1  MCV 98.5  PLT 811*   Basic Metabolic Panel: Recent Labs  Lab 04/05/17 2342  NA 142  K 4.0  CL 100*  CO2 38*  GLUCOSE 102*  BUN 16  CREATININE 0.37*  CALCIUM 9.1   GFR: Estimated Creatinine Clearance: 63 mL/min (A) (by C-G formula  based on SCr of 0.37 mg/dL (L)). Liver Function Tests: Recent Labs  Lab 04/05/17 2342  AST 14*  ALT 7*  ALKPHOS 68  BILITOT 0.6  PROT 6.1*  ALBUMIN 3.6   Recent Labs  Lab 04/05/17 2342  LIPASE 24   No results for input(s): AMMONIA in the last 168 hours. Coagulation Profile: No results for input(s): INR, PROTIME in the last 168 hours. Cardiac Enzymes: No results for input(s): CKTOTAL, CKMB, CKMBINDEX, TROPONINI in the last 168 hours. BNP (last 3 results) No results for input(s): PROBNP in the last 8760 hours. HbA1C: No results for input(s): HGBA1C in the last 72 hours. CBG: No results for input(s): GLUCAP in the last 168 hours. Lipid Profile: No results for input(s): CHOL, HDL, LDLCALC, TRIG, CHOLHDL, LDLDIRECT in the last 72 hours. Thyroid Function Tests: No results for input(s): TSH, T4TOTAL, FREET4, T3FREE, THYROIDAB in the last 72 hours. Anemia Panel: No results for input(s): VITAMINB12, FOLATE, FERRITIN, TIBC, IRON, RETICCTPCT in the last 72 hours. Urine analysis:    Component Value Date/Time   COLORURINE YELLOW 04/06/2017 0025   APPEARANCEUR CLEAR 04/06/2017 0025   LABSPEC 1.023 04/06/2017 0025   PHURINE 6.0 04/06/2017 0025   GLUCOSEU NEGATIVE 04/06/2017 0025   HGBUR NEGATIVE 04/06/2017 0025   BILIRUBINUR NEGATIVE 04/06/2017 0025   KETONESUR NEGATIVE 04/06/2017 0025   PROTEINUR NEGATIVE 04/06/2017 0025   UROBILINOGEN 1.0 09/10/2014 0919   NITRITE NEGATIVE 04/06/2017 0025   LEUKOCYTESUR NEGATIVE 04/06/2017 0025   Sepsis Labs: @LABRCNTIP (procalcitonin:4,lacticidven:4) )No results found for this or any previous visit (from the past 240 hour(s)).   Radiological Exams on Admission: Ct Abdomen Pelvis W Contrast  Result Date: 04/06/2017 CLINICAL DATA:  Dull aching pain in the left groin EXAM: CT ABDOMEN AND PELVIS WITH CONTRAST TECHNIQUE: Multidetector CT imaging of the abdomen and pelvis was performed using the standard protocol following bolus administration  of intravenous contrast. CONTRAST:  150mL ISOVUE-300 IOPAMIDOL (ISOVUE-300) INJECTION 61% COMPARISON:  10/21/2014 FINDINGS: Lower chest: Lung bases demonstrate patchy atelectasis in the left lower echo. Small left-sided pleural effusion.  Cardiomegaly. Partially visualized sternotomy changes. Hepatobiliary: Small calcified stones in the gallbladder. No biliary dilatation. No focal hepatic abnormality Pancreas: Unremarkable. No pancreatic ductal dilatation or surrounding inflammatory changes. Spleen: Normal in size without focal abnormality. Adrenals/Urinary Tract: Adrenal glands are within normal limits. 6 mm stone in the mid pole of the left kidney. Small stones in the lower pole of the right kidney. Subcentimeter cortical hypodense lesions too small to further characterize but probably cysts. Hyperdense 6 mm lesion superior pole right kidney, series 2, image number 22. The urinary bladder is unremarkable Stomach/Bowel: The stomach is nonenlarged. Postsurgical changes at the GE junction and distal stomach. Dilated loops of small bowel in the left lower quadrant measuring up to 3.8 cm. There appears to be a transition point related to a small left inguinal hernia, series 2, image number 65. Bowel distal to this is nonenlarged. No colon wall thickening. Vascular/Lymphatic: Atherosclerotic vascular disease. Patient is status post aorto iliac stent with patent appearing graft. Further decrease in size of the infrarenal abdominal aortic aneurysm measuring 3.2 cm AP, compared to 5.1 cm previously. No significantly enlarged abdominal or pelvic lymph nodes. Reproductive: Slightly enlarged prostate with calcifications Other: Negative for free air or significant free pelvic fluid. Musculoskeletal: Degenerative changes of the spine. No acute or suspicious bone lesion. IMPRESSION: 1. Dilated fluid-filled loops of small bowel in the left lower quadrant with suspected transition point related to a small left inguinal hernia. 2.  Status post aorto iliac bypass graft with further decrease in size of the previously noted infrarenal aortic aneurysm. 3. Gallstones 4. Nonobstructing stones within both kidneys. Electronically Signed   By: Donavan Foil M.D.   On: 04/06/2017 01:33    EKG: Ordered and pending.   Assessment/Plan  1. Left inguinal hernia with incarceration  - Pt presents with 2 days of progressive pain in left groin; CT suggests incarceration  - Lactic acid is reassuringly normal and no leukocytosis or fevers  - He was provided analgesia and IVF in ED and NGT was placed  - Surgery is consulting and much appreciated, recommend medical admission to [facility] given significant comorbidity  - Plan to continue bowel-rest, NGT decompression, analgesia and antiemetics, gentle IVF hydration   2. Chronic systolic CHF  - Pt appears well-compensated on admission  - EF 20% on echo from December '17  - Follow daily wts and I/O's  - Hold diuretic while NPO    3. COPD  - Stable; no wheezing or cough on admission  - Continue Spiriva and prn albuterol, continue supplemental O2    4. Chronic pain  - Pt has hx of chronic pain managed with Norco  - Using prn fentanyl IVP's while NPO    5. Anxiety disorder  - Stable  - Continue Klonopin     DVT prophylaxis: SCD's  Code Status: DNR Family Communication: Wife updated at bedside Disposition Plan: Admit to med-surg Consults called: Surgery Admission status: Inpatient    Vianne Bulls, MD [facility] Pager [phone]  If 7PM-7AM, please contact night-coverage [email or web address] Password [email or web address]  04/06/2017, 2:19 AM

## 2017-04-06 NOTE — Consult Note (Signed)
[facility] Consult  Reason for Consult: Incarcerated recurrent left inguinal hernia  Referring Physician: Dr. Wyvonnia Dusky  Chief Complaint    Groin Pain      Tommy Turner is a 76 y.o. male.  HPI: Tommy Turner is a 76 yo male who has multiple medical issues including CODP on home O2, HTN, CAD, CHF with EF 20% on ECHO in 2017, ischemic cardiomyopathy, prior endovascular AAA repair who presents with pain in his left groin and nausea. The patient had the hernia repaired per his family report years ago (procedure note from 2005 where plug and patch were used).  Tommy Turner had not noticed a recurrence until yesterday when these symptoms started. The ED has attempted reduction of the hernia without success.    Past Medical History:  Diagnosis Date  . AAA (abdominal aortic aneurysm) ([place])   . Aneurysm ([place])   . Anxiety    takes Xanax daily as needed  . CAD (coronary artery disease)   . CHF (congestive heart failure) ([place])   . COPD (chronic obstructive pulmonary disease) (H[facility])    Albuterol inhaler and neb daily as needed;takes SPiriva daily  . Depression    takes Celexa daily  . Foley catheter in place   . History of shingles   . Hyperlipidemia    takes Atorvastatin daily  . Hypertension    takes Metoprolol and Benazepril  daily  . Myocardial infarction ([place])    20+yrs ago  . Oxygen dependent    2L/I[place]   . Peripheral edema    takes Furosemide daily  . Shortness of breath     on oxygen  2 liters continuous  . Ulcer    gastric    Past Surgical History:  Procedure Laterality Date  . ABDOMINAL AORTIC ENDOVASCULAR STENT GRAFT N/A 09/14/2014   Procedure: ABDOMINAL AORTIC ENDOVASCULAR STENT GRAFT;  Surgeon: Conrad Dodge, MD;  Location: [facility];  Service: Vascular;  Laterality: N/A;  . CORONARY ARTERY BYPASS GRAFT  20+yrs ago   x 6  . ESOPHAGOGASTRODUODENOSCOPY    . HERNIA REPAIR Left    inguinal  . LIPOMA EXCISION     right lower back  . PARTIAL GASTRECTOMY     approx  1990  . TONSILLECTOMY     as a child  . TRANSURETHRAL RESECTION OF PROSTATE N/A 11/24/2015   Procedure: TRANSURETHRAL RESECTION OF THE PROSTATE (TURP);  Surgeon: Irine Seal, MD;  Location: [facility];  Service: Urology;  Laterality: N/A;    Family History  Problem Relation Age of Onset  . Hypertension Brother   . Heart disease Brother        Heart Disease before age 79  . Heart attack Brother   . Cancer Brother   . Cancer Mother   . Diabetes Mother   . Heart disease Mother        Heart Disease before age 55  . Hypertension Mother   . Heart attack Mother   . Cancer Father        prostate  . Hypertension Father   . COPD Sister   . Diabetes Sister   . Heart disease Sister   . Hypertension Sister   . Heart attack Sister   . Colon cancer Neg Hx     Social History   Tobacco Use  . Smoking status: Former Smoker    Packs/day: 0.25    Years: 58.00    Pack years: 14.50    Types: Cigarettes    Last attempt to  quit: 11/02/2014    Years since quitting: 2.4  . Smokeless tobacco: Never Used  . Tobacco comment: vapor ciggs  Substance Use Topics  . Alcohol use: No    Alcohol/week: 0.0 oz  . Drug use: No   Current Facility-Administered Medications  Medication Dose Route Frequency Provider Last Rate Last Dose  . acetaminophen (TYLENOL) tablet 650 mg  650 mg Oral Q6H PRN Opyd, Ilene Qua, MD       Or  . acetaminophen (TYLENOL) suppository 650 mg  650 mg Rectal Q6H PRN Opyd, Ilene Qua, MD      . albuterol (PROVENTIL) (2.5 MG/3ML) 0.083% nebulizer solution 2.5 mg  2.5 mg Nebulization Q4H PRN Opyd, Ilene Qua, MD      . clonazePAM (KLONOPIN) tablet 0.5 mg  0.5 mg Oral BID PRN Opyd, Ilene Qua, MD      . dextrose 5 % and 0.45 % NaCl with KCl 10 mEq/L infusion   Intravenous Continuous Opyd, Ilene Qua, MD      . fentaNYL (SUBLIMAZE) injection 25-50 mcg  25-50 mcg Intravenous Q2H PRN Opyd, Ilene Qua, MD      . tiotropium (SPIRIVA) inhalation capsule 18 mcg  18 mcg Inhalation Daily Opyd, Ilene Qua, MD       Current Outpatient Medications  Medication Sig Dispense Refill Last Dose  . albuterol (PROVENTIL HFA;VENTOLIN HFA) 108 (90 Base) MCG/ACT inhaler Inhale 2 puffs into the lungs every 4 (four) hours as needed for wheezing or shortness of breath. 1 Inhaler 5 04/05/2017 at Unknown time  . albuterol (PROVENTIL) (2.5 MG/3ML) 0.083% nebulizer solution USE 1 VIAL BY NEBULIZATION EVERY 2 (TWO) HOURS AS NEEDED FOR WHEEZING OR SHORTNESS OF BREATH 150 mL 5 04/05/2017 at Unknown time  . aspirin EC 81 MG tablet Take 81 mg by mouth daily.    04/05/2017 at Unknown time  . clonazePAM (KLONOPIN) 0.5 MG tablet Take 1 tablet (0.5 mg total) by mouth 2 (two) times daily as needed. for anxiety 60 tablet 5 unknown  . HYDROcodone-acetaminophen (NORCO) 10-325 MG tablet Take 0.5 tablets by mouth every 6 (six) hours as needed for severe pain. 60 tablet 0 04/05/2017 at Unknown time  . metoprolol succinate (TOPROL-XL) 25 MG 24 hr tablet TAKE 1/2 TABLET BY MOUTH 2 TIMES DAILY. (Patient taking differently: TAKE 1/2 TABLET BY MOUTH DAILY.) 90 tablet 3 04/05/2017 at 830a  . nitroGLYCERIN (NITROSTAT) 0.4 MG SL tablet Place 1 tablet (0.4 mg total) under the tongue every 5 (five) minutes as needed for chest pain. 100 tablet 0 unknown  . potassium chloride SA (K-DUR,KLOR-CON) 20 MEQ tablet TAKE 1 TABLET BY MOUTH ONCE DAILY (Patient taking differently: TAKE 1 TABLET BY MOUTH ONCE EVERY OTHER DAY) 90 tablet 3 04/05/2017 at Unknown time  . SPIRIVA HANDIHALER 18 MCG inhalation capsule PLACE 1 CAPSULE INTO INHALER AND INHALE DAILY 90 capsule 0 04/05/2017 at Unknown time  . torsemide (DEMADEX) 20 MG tablet Take 1 tablet (20 mg total) by mouth daily. (Patient taking differently: Take 20 mg by mouth every other day. ) 90 tablet 1 04/05/2017 at Unknown time  . nicotine (NICODERM CQ) 14 mg/24hr patch Place 1 patch (14 mg total) onto the skin daily. (Patient not taking: Reported on 04/05/2017) 28 patch 6 Not Taking at Unknown time    Allergies: No Known Allergies   ROS:  A comprehensive review of systems was negative except for: Respiratory: positive for dyspnea on exertion Gastrointestinal: positive for abdominal pain and nausea Musculoskeletal: positive for left groin pain and  bulge  Blood pressure 122/70, pulse 70, temperature 98.3 F (36.8 C), temperature source Oral, resp. rate 20, height '5\' 7"'  (1.702 m), weight 127 lb (57.6 kg), SpO2 93 %. Physical Exam  Constitutional: Vital signs are normal. Tommy Turner appears malnourished. No distress.  HENT:  Head: Normocephalic.  Eyes: Pupils are equal, round, and reactive to light.  Cardiovascular: Normal rate.  Pulmonary/Chest: Effort normal.  Abdominal: Soft. Tommy Turner exhibits no distension. There is no tenderness. A hernia is present. Hernia confirmed positive in the left inguinal area.  Tender over the hernia with 2cm bulge  Neurological: Tommy Turner is alert.  Skin: Skin is warm and dry.  Psychiatric: Mood, memory, affect and judgment normal.    Results: Results for orders placed or performed during the hospital encounter of 04/05/17 (from the past 48 hour(s))  CBC with Differential/Platelet     Status: Abnormal   Collection Time: 04/05/17 11:42 PM  Result Value Ref Range   WBC 8.0 4.0 - 10.5 K/uL   RBC 3.97 (L) 4.22 - 5.81 MIL/uL   Hemoglobin 11.9 (L) 13.0 - 17.0 g/dL   HCT 39.1 39.0 - 52.0 %   MCV 98.5 78.0 - 100.0 fL   MCH 30.0 26.0 - 34.0 pg   MCHC 30.4 30.0 - 36.0 g/dL   RDW 13.2 11.5 - 15.5 %   Platelets 140 (L) 150 - 400 K/uL   Neutrophils Relative % 76 %   Neutro Abs 6.0 1.7 - 7.7 K/uL   Lymphocytes Relative 16 %   Lymphs Abs 1.3 0.7 - 4.0 K/uL   Monocytes Relative 8 %   Monocytes Absolute 0.6 0.1 - 1.0 K/uL   Eosinophils Relative 0 %   Eosinophils Absolute 0.0 0.0 - 0.7 K/uL   Basophils Relative 0 %   Basophils Absolute 0.0 0.0 - 0.1 K/uL  Comprehensive metabolic panel     Status: Abnormal   Collection Time: 04/05/17 11:42 PM  Result Value Ref Range    Sodium 142 135 - 145 mmol/L   Potassium 4.0 3.5 - 5.1 mmol/L   Chloride 100 (L) 101 - 111 mmol/L   CO2 38 (H) 22 - 32 mmol/L   Glucose, Bld 102 (H) 65 - 99 mg/dL   BUN 16 6 - 20 mg/dL   Creatinine, Ser 0.37 (L) 0.61 - 1.24 mg/dL   Calcium 9.1 8.9 - 10.3 mg/dL   Total Protein 6.1 (L) 6.5 - 8.1 g/dL   Albumin 3.6 3.5 - 5.0 g/dL   AST 14 (L) 15 - 41 U/L   ALT 7 (L) 17 - 63 U/L   Alkaline Phosphatase 68 38 - 126 U/L   Total Bilirubin 0.6 0.3 - 1.2 mg/dL   GFR calc non Af Amer >60 >60 mL/min   GFR calc Af Amer >60 >60 mL/min    Comment: (NOTE) The eGFR has been calculated using the CKD EPI equation. This calculation has not been validated in all clinical situations. eGFR's persistently <60 mL/min signify possible Chronic Kidney Disease.    Anion gap 4 (L) 5 - 15  Lipase, blood     Status: None   Collection Time: 04/05/17 11:42 PM  Result Value Ref Range   Lipase 24 11 - 51 U/L  I-Stat CG4 Lactic Acid, ED     Status: Abnormal   Collection Time: 04/06/17 12:03 AM  Result Value Ref Range   Lactic Acid, Venous 0.47 (L) 0.5 - 1.9 mmol/L  Urinalysis, Routine w reflex microscopic     Status: None  Collection Time: 04/06/17 12:25 AM  Result Value Ref Range   Color, Urine YELLOW YELLOW   APPearance CLEAR CLEAR   Specific Gravity, Urine 1.023 1.005 - 1.030   pH 6.0 5.0 - 8.0   Glucose, UA NEGATIVE NEGATIVE mg/dL   Hgb urine dipstick NEGATIVE NEGATIVE   Bilirubin Urine NEGATIVE NEGATIVE   Ketones, ur NEGATIVE NEGATIVE mg/dL   Protein, ur NEGATIVE NEGATIVE mg/dL   Nitrite NEGATIVE NEGATIVE   Leukocytes, UA NEGATIVE NEGATIVE   Personally reviewed the CT- small bowel within left inguinal hernia, dilated bowel proximal   Ct Abdomen Pelvis W Contrast  Result Date: 04/06/2017 CLINICAL DATA:  Dull aching pain in the left groin EXAM: CT ABDOMEN AND PELVIS WITH CONTRAST TECHNIQUE: Multidetector CT imaging of the abdomen and pelvis was performed using the standard protocol following bolus  administration of intravenous contrast. CONTRAST:  16m ISOVUE-300 IOPAMIDOL (ISOVUE-300) INJECTION 61% COMPARISON:  10/21/2014 FINDINGS: Lower chest: Lung bases demonstrate patchy atelectasis in the left lower echo. Small left-sided pleural effusion. Cardiomegaly. Partially visualized sternotomy changes. Hepatobiliary: Small calcified stones in the gallbladder. No biliary dilatation. No focal hepatic abnormality Pancreas: Unremarkable. No pancreatic ductal dilatation or surrounding inflammatory changes. Spleen: Normal in size without focal abnormality. Adrenals/Urinary Tract: Adrenal glands are within normal limits. 6 mm stone in the mid pole of the left kidney. Small stones in the lower pole of the right kidney. Subcentimeter cortical hypodense lesions too small to further characterize but probably cysts. Hyperdense 6 mm lesion superior pole right kidney, series 2, image number 22. The urinary bladder is unremarkable Stomach/Bowel: The stomach is nonenlarged. Postsurgical changes at the GE junction and distal stomach. Dilated loops of small bowel in the left lower quadrant measuring up to 3.8 cm. There appears to be a transition point related to a small left inguinal hernia, series 2, image number 65. Bowel distal to this is nonenlarged. No colon wall thickening. Vascular/Lymphatic: Atherosclerotic vascular disease. Patient is status post aorto iliac stent with patent appearing graft. Further decrease in size of the infrarenal abdominal aortic aneurysm measuring 3.2 cm AP, compared to 5.1 cm previously. No significantly enlarged abdominal or pelvic lymph nodes. Reproductive: Slightly enlarged prostate with calcifications Other: Negative for free air or significant free pelvic fluid. Musculoskeletal: Degenerative changes of the spine. No acute or suspicious bone lesion. IMPRESSION: 1. Dilated fluid-filled loops of small bowel in the left lower quadrant with suspected transition point related to a small left  inguinal hernia. 2. Status post aorto iliac bypass graft with further decrease in size of the previously noted infrarenal aortic aneurysm. 3. Gallstones 4. Nonobstructing stones within both kidneys. Electronically Signed   By: KDonavan FoilM.D.   On: 04/06/2017 01:33   Dg Chest Portable 1 View  Result Date: 04/06/2017 CLINICAL DATA:  NG tube placement EXAM: PORTABLE CHEST 1 VIEW COMPARISON:  04/17/2016 FINDINGS: Post sternotomy changes. Esophageal tube tip poorly visible but tubing seen to the mid stomach. Small left pleural effusion. Improving aeration at the left base. Cardiomegaly with vascular congestion. Tiny right effusion. No pneumothorax. IMPRESSION: 1. Esophageal tube being visualized to the mid stomach 2. Small pleural effusions with improving aeration at the left base. 3. Cardiomegaly with vascular congestion Electronically Signed   By: KDonavan FoilM.D.   On: 04/06/2017 03:09    Assessment & Plan:  JNGOC DAUGHTRIDGEis a 76y.o. male with an incarcerated left inguinal hernia that is causing a small bowel obstruction. Tommy Turner has had a prior repair with plug  and patch in 2005 (note in the EHR).  Tommy Turner has multiple other medical co-morbidites that preclude him from getting surgery at [facility] but does need this hernia repaired given the incarceration and bowel obstruction.   -Fentanyl 50 mcg, Ativan 52m given and hernia reduced at bedside with minimal difficulty, patient tolerated well aside from a minor desaturation that resolved with O2  [facility]   All questions were answered to the satisfaction of the patient and family.   LVirl Cagey12/05/2016, 3:12 AM

## 2017-04-07 DIAGNOSIS — K4030 Unilateral inguinal hernia, with obstruction, without gangrene, not specified as recurrent: Secondary | ICD-10-CM

## 2017-04-07 DIAGNOSIS — J449 Chronic obstructive pulmonary disease, unspecified: Secondary | ICD-10-CM

## 2017-04-07 DIAGNOSIS — R0902 Hypoxemia: Secondary | ICD-10-CM

## 2017-04-07 DIAGNOSIS — I5022 Chronic systolic (congestive) heart failure: Secondary | ICD-10-CM

## 2017-04-07 DIAGNOSIS — K56609 Unspecified intestinal obstruction, unspecified as to partial versus complete obstruction: Secondary | ICD-10-CM

## 2017-04-07 DIAGNOSIS — I2510 Atherosclerotic heart disease of native coronary artery without angina pectoris: Secondary | ICD-10-CM

## 2017-04-07 LAB — GLUCOSE, CAPILLARY
GLUCOSE-CAPILLARY: 77 mg/dL (ref 65–99)
Glucose-Capillary: 72 mg/dL (ref 65–99)

## 2017-04-07 LAB — URINE CULTURE: Culture: NO GROWTH

## 2017-04-07 MED ORDER — DEXTROSE-NACL 5-0.45 % IV SOLN
INTRAVENOUS | Status: DC
  Administered 2017-04-07 (×2): via INTRAVENOUS

## 2017-04-07 NOTE — Care Management Note (Signed)
Case Management Note  Patient Details  Name: JUJUAN DUGO MRN: [id number] Date of Birth: 05/31/1940  Subjective/Objective:  preoperative cardiac risk stratification, scheduled hernia repair 04/08/2017                  Action/Plan: Discharge Planning:  Expected Discharge Date:                  Expected Discharge Plan:  [place]  In-House Referral:  NA  Discharge planning Services  CM Consult  Post Acute Care Choice:    Choice offered to:     DME Arranged:    DME Agency:     HH Arranged:    HH Agency:     Status of Service:  In process, will continue to follow  If discussed at Long Length of Stay Meetings, dates discussed:    Additional Comments:  Erenest Rasher, RN 04/07/2017, 5:39 PM

## 2017-04-07 NOTE — Progress Notes (Signed)
PROGRESS NOTE  Tommy Turner:[id number] DOB: 10-07-1940 DOA: 04/05/2017 PCP: [name], MD  HPI/Recap of past 24 hours: Tommy Turner is a 76 y.o. male with medical history significant for AAA status post repair, chronic systolic CHF with EF 24% (2017), CAD, ischemic CM, COPD, chronic respiratory failure, anxiety disorder, and chronic pain, now presenting to the emergency department for evaluation of left groin pain 04/05/17. Onset 2 days ago. Had a left inguinal hernia repair approximately 10 years ago. Left inguinal Hernia was incarcerated then reduced at the bedside by general surgery at [facility].  CT of the abdomen and pelvis 04/06/17 demonstrates dilated loops of small bowel in the left lower quadrant with transition point related to the small left inguinal hernia.  NG tube was placed and the patient was treated with IV fluids, fentanyl, and Zofran. General surgery following.   Pt removed his NG tube overnight. Passing gas. Tolerated ice chips. Pt seen and examined with his wife at his bedside. Still has tenderness upon palpation of his lower abdomen. No bulging noted on left lower abd quadrant or groin. Afebrile.    Assessment/Plan: Principal Problem:   Incarcerated inguinal hernia, unilateral Active Problems:   Essential hypertension   COPD with hypoxia (H[facility])   CAD (coronary artery disease)   Anxiety   Chronic pain syndrome   Chronic respiratory failure with hypoxia and hypercapnia (H[facility])   Chronic systolic CHF (congestive heart failure) (H[facility])   Thrombocytopenia (H[facility])   Incarcerated inguinal hernia  Acute left inguinal hernia incarceration, resolved -Reduced by general surgery early this am -general surgery following. -We appreciate gen surgery recommendations -pain management in place -norco 10-325 prn for severe pain -NG tube removed overnight by patient. -General surgery plans for surgical intervention tomorrow morning. -closely monitor  symptoms  Chronic systolic heart failure with EF 20% (04/2016) -appears stable, in no acute exacerbation -asa, metoprolol, torsemide -no statin in home med list  Ischemic cardiomyopathy -management as stated above  S/p endovascular repair AAA -no acute issues  COPD, no acute exacerbation -stable -proventin, spiriva  Tobacco use disorder -nicotine patch -tobacco cessation counseling  Anxiety -stable -klonopin  Code Status: DNR  Family Communication: Wife at bedside. All questions answered to her satisfaction.  Disposition Plan: Will stay another midnight to closely monitor symptoms and left inguinal hernia. Possible surgical intervention tomorrow Monday 04/08/17.   Consultants:  General surgery  Procedures:  Left inguinal hernia manual reduction  Surgery planned 04/08/17  Antimicrobials:  None indicated  DVT prophylaxis:  lovenox 40 mg sq daily   Objective: Vitals:   04/06/17 1104 04/06/17 1332 04/06/17 2107 04/07/17 0611  BP:  (!) 101/55 107/60 (!) 100/54  Pulse: 77 99 80 78  Resp: 16 16    Temp:  98.2 F (36.8 C) 98.9 F (37.2 C) 98.7 F (37.1 C)  TempSrc:  Oral Oral Oral  SpO2: 95% 99% 96% 93%  Weight:      Height:        Intake/Output Summary (Last 24 hours) at 04/07/2017 0818 Last data filed at 04/06/2017 1730 Gross per 24 hour  Intake 1890 ml  Output -  Net 1890 ml   Filed Weights   04/05/17 2120 04/06/17 0614  Weight: 57.6 kg (127 lb) 59.8 kg (131 lb 13.4 oz)    Exam:   General:  76 yo CM thin built laying in bed in NAD.  Cardiovascular: RRR no rubs or gallops  Respiratory: CTA no wheezes or rales  Abdomen:  soft NT ND NBSx 4 quadrants  Musculoskeletal: left inguinal region is tender on palpation  Skin: No noted rashes  Psychiatry: Mood is appropriate for condition and setting   Data Reviewed: CBC: Recent Labs  Lab 04/05/17 2342 04/06/17 0712  WBC 8.0 11.7*  NEUTROABS 6.0 9.7*  HGB 11.9* 11.8*  HCT 39.1 38.6*   MCV 98.5 97.7  PLT 140* 035*   Basic Metabolic Panel: Recent Labs  Lab 04/05/17 2342  NA 142  K 4.0  CL 100*  CO2 38*  GLUCOSE 102*  BUN 16  CREATININE 0.37*  CALCIUM 9.1   GFR: Estimated Creatinine Clearance: 65.4 mL/min (A) (by C-G formula based on SCr of 0.37 mg/dL (L)). Liver Function Tests: Recent Labs  Lab 04/05/17 2342  AST 14*  ALT 7*  ALKPHOS 68  BILITOT 0.6  PROT 6.1*  ALBUMIN 3.6   Recent Labs  Lab 04/05/17 2342  LIPASE 24   No results for input(s): AMMONIA in the last 168 hours. Coagulation Profile: No results for input(s): INR, PROTIME in the last 168 hours. Cardiac Enzymes: No results for input(s): CKTOTAL, CKMB, CKMBINDEX, TROPONINI in the last 168 hours. BNP (last 3 results) No results for input(s): PROBNP in the last 8760 hours. HbA1C: No results for input(s): HGBA1C in the last 72 hours. CBG: Recent Labs  Lab 04/06/17 0610 04/07/17 0615 04/07/17 0814  GLUCAP 87 77 72   Lipid Profile: No results for input(s): CHOL, HDL, LDLCALC, TRIG, CHOLHDL, LDLDIRECT in the last 72 hours. Thyroid Function Tests: No results for input(s): TSH, T4TOTAL, FREET4, T3FREE, THYROIDAB in the last 72 hours. Anemia Panel: No results for input(s): VITAMINB12, FOLATE, FERRITIN, TIBC, IRON, RETICCTPCT in the last 72 hours. Urine analysis:    Component Value Date/Time   COLORURINE YELLOW 04/06/2017 0025   APPEARANCEUR CLEAR 04/06/2017 0025   LABSPEC 1.023 04/06/2017 0025   PHURINE 6.0 04/06/2017 0025   GLUCOSEU NEGATIVE 04/06/2017 0025   HGBUR NEGATIVE 04/06/2017 0025   BILIRUBINUR NEGATIVE 04/06/2017 0025   KETONESUR NEGATIVE 04/06/2017 0025   PROTEINUR NEGATIVE 04/06/2017 0025   UROBILINOGEN 1.0 09/10/2014 0919   NITRITE NEGATIVE 04/06/2017 0025   LEUKOCYTESUR NEGATIVE 04/06/2017 0025   Sepsis Labs: @LABRCNTIP (procalcitonin:4,lacticidven:4)  )No results found for this or any previous visit (from the past 240 hour(s)).    Studies: No results  found.  Scheduled Meds: . enoxaparin (LOVENOX) injection  40 mg Subcutaneous Q24H  . tiotropium  18 mcg Inhalation Daily    Continuous Infusions:    LOS: 1 day     Kayleen Memos, MD [facility] Pager [phone]  If 7PM-7AM, please contact night-coverage [email or web address] Password [facility] 04/07/2017, 8:18 AM

## 2017-04-07 NOTE — Progress Notes (Signed)
This RN attempted to insert NG tube but was unsuccessful. A second RN attempted but was unsuccessful too. Will pass on to Day nurse.

## 2017-04-07 NOTE — H&P (View-Only) (Signed)
Reduced LIH but still has tenderness High risk of recurrence and multiple medical problems Recommend repair of LIH with mesh Monday High risk but still has pain and I'm concerned about recurrence  The risk of hernia repair include bleeding,  Infection,   Recurrence of the hernia,  Mesh use, chronic pain,  Organ injury,  Bowel injury,  Bladder injury,   nerve injury with numbness around the incision,  Death,  and worsening of preexisting  medical problems.  The alternatives to surgery have been discussed as well..  Long term expectations of both operative and non operative treatments have been discussed.   The patient agrees to proceed.

## 2017-04-07 NOTE — Anesthesia Preprocedure Evaluation (Addendum)
Anesthesia Evaluation  Patient identified by MRN, date of birth, ID band Patient awake    Reviewed: Allergy & Precautions, NPO status , Patient's Chart, lab work & pertinent test results, reviewed documented beta blocker date and time   Airway Mallampati: I  TM Distance: >3 FB Neck ROM: Full    Dental  (+) Edentulous Upper, Edentulous Lower   Pulmonary COPD,  COPD inhaler and oxygen dependent, former smoker,     (-) decreased breath sounds      Cardiovascular hypertension, Pt. on medications and Pt. on home beta blockers + CAD, + Past MI, + CABG, + Peripheral Vascular Disease and +CHF  Normal cardiovascular exam Rhythm:Regular Rate:Normal     Neuro/Psych PSYCHIATRIC DISORDERS Anxiety Depression negative neurological ROS     GI/Hepatic Neg liver ROS, PUD,   Endo/Other  negative endocrine ROS  Renal/GU negative Renal ROS  negative genitourinary   Musculoskeletal negative musculoskeletal ROS (+)   Abdominal   Peds negative pediatric ROS (+)  Hematology negative hematology ROS (+)   Anesthesia Other Findings Echo 17 Left ventricle:  The cavity size was mildly dilated. Wall thickness was increased in a pattern of mild LVH. Systolic function was severely reduced. The estimated ejection fraction was 20%. Regional wall motion abnormalities:   There is akinesis of the inferior and inferoseptal myocardium.  There is dyskinesis of the mid-apicalanteroseptal and apical myocardium. The study is not technically sufficient to allow evaluation of LV diastolic function.  Reproductive/Obstetrics negative OB ROS                            Lab Results  Component Value Date   WBC 11.7 (H) 04/06/2017   HGB 11.8 (L) 04/06/2017   HCT 38.6 (L) 04/06/2017   MCV 97.7 04/06/2017   PLT 125 (L) 04/06/2017   Lab Results  Component Value Date   CREATININE 0.37 (L) 04/05/2017   BUN 16 04/05/2017   NA 142  04/05/2017   K 4.0 04/05/2017   CL 100 (L) 04/05/2017   CO2 38 (H) 04/05/2017   Lab Results  Component Value Date   INR 1.17 09/14/2014   INR 1.07 09/10/2014   INR 1.02 01/19/2014   10/2015 EKG: sinus bradycardia, 1st degree AV block.  04/2015 Echo - Left ventricle: LVEF is approximately 20 to 25% with severe hypokinesis of the lateral wall; akinenesis of the distal lateral, distal septal, apical, mid/distal inferior , distal anteior walls False tendon seen in LV. The cavity size was normal. Wall thickness was normal. Doppler parameters are consistent with abnormal left ventricular relaxation (grade 1 diastolic dysfunction). - Mitral valve: Calcified annulus. Mildly thickened leaflets . - Left atrium: The atrium was moderately to severely dilated. - Right ventricle: The cavity size was mildly dilated. Systolic function was moderately reduced. - Right atrium: The atrium was moderately dilated.  Anesthesia Physical  Anesthesia Plan  ASA: IV  Anesthesia Plan: General   Post-op Pain Management: GA combined w/ Regional for post-op pain   Induction: Intravenous  PONV Risk Score and Plan: 1 and Treatment may vary due to age or medical condition  Airway Management Planned: LMA and Oral ETT  Additional Equipment:   Intra-op Plan:   Post-operative Plan: Possible Post-op intubation/ventilation and Extubation in OR  Informed Consent: I have reviewed the patients History and Physical, chart, labs and discussed the procedure including the risks, benefits and alternatives for the proposed anesthesia with the patient or authorized representative who  has indicated his/her understanding and acceptance.   Dental advisory given  Plan Discussed with: CRNA  Anesthesia Plan Comments: (Possible ETT if LMA does not seat.  Anesthetic plan discussed in detail. Associated risk discussed including but not limited to life threatening cardiovascular, pulmonary events and dental damage. The  postoperative pain management and antiemetic plan discussed with patient. All questions answered in detail. Patient is in agreement.   )      Anesthesia Quick Evaluation

## 2017-04-07 NOTE — Progress Notes (Signed)
Reduced LIH but still has tenderness High risk of recurrence and multiple medical problems Recommend repair of LIH with mesh Monday High risk but still has pain and I'm concerned about recurrence  The risk of hernia repair include bleeding,  Infection,   Recurrence of the hernia,  Mesh use, chronic pain,  Organ injury,  Bowel injury,  Bladder injury,   nerve injury with numbness around the incision,  Death,  and worsening of preexisting  medical problems.  The alternatives to surgery have been discussed as well..  Long term expectations of both operative and non operative treatments have been discussed.   The patient agrees to proceed.

## 2017-04-07 NOTE — Progress Notes (Signed)
[facility]/Trauma Progress Note      Assessment/Plan Principal Problem:   Incarcerated inguinal hernia, unilateral Active Problems:   Essential hypertension   COPD with hypoxia (HCC)   CAD (coronary artery disease)   Anxiety   Chronic pain syndrome   Chronic respiratory failure with hypoxia and hypercapnia (HCC)   Chronic systolic CHF (congestive heart failure) (HCC)   Thrombocytopenia (HCC)   Incarcerated inguinal hernia  Incarcerated L inguinal hernia - unable to place NGT - will take to OR tomorrow   - cards consulted for clearance   FEN: clears, NPO at midnight VTE: SCD's, lovenox ID: none Foley: none Follow up: TBD  DISPO: OR tomorrow, card clearance pending, clears, NPO at midnight    LOS: 1 day    Subjective: CC: L groin pain  Pt states abdominal pain has resolved. He states he is having flatus. His wife is at bedside and states she is not aware he has had gas. No nausea or vomiting.   Objective: Vital signs in last 24 hours: Temp:  [98.2 F (36.8 C)-98.9 F (37.2 C)] 98.7 F (37.1 C) (12/02 0611) Pulse Rate:  [77-99] 78 (12/02 0611) Resp:  [16] 16 (12/01 1332) BP: (100-107)/(54-60) 100/54 (12/02 0611) SpO2:  [90 %-99 %] 93 % (12/02 0924) Last BM Date: 04/05/17  Intake/Output from previous day: 12/01 0701 - 12/02 0700 In: 1890 [I.V.:1890] Out: -  Intake/Output this shift: No intake/output data recorded.  PE: Gen:  Alert, NAD, pleasant, cooperative Card:  RRR, no M/G/R heard Pulm:  Rate and effort normal Abd: Soft, NT/ND, +BS, no HSM GU: L inguinal hernia with moderate TTP, pt did not tolerate my attempt to reduce Skin: no rashes noted, warm and dry   Anti-infectives: Anti-infectives (From admission, onward)   None      Lab Results:  Recent Labs    04/05/17 2342 04/06/17 0712  WBC 8.0 11.7*  HGB 11.9* 11.8*  HCT 39.1 38.6*  PLT 140* 125*   BMET Recent Labs    04/05/17 2342  NA 142  K 4.0  CL 100*  CO2 38*   GLUCOSE 102*  BUN 16  CREATININE 0.37*  CALCIUM 9.1   PT/INR No results for input(s): LABPROT, INR in the last 72 hours. CMP     Component Value Date/Time   NA 142 04/05/2017 2342   NA 143 01/15/2017 1134   K 4.0 04/05/2017 2342   CL 100 (L) 04/05/2017 2342   CO2 38 (H) 04/05/2017 2342   GLUCOSE 102 (H) 04/05/2017 2342   BUN 16 04/05/2017 2342   BUN 8 01/15/2017 1134   CREATININE 0.37 (L) 04/05/2017 2342   CREATININE 0.51 08/12/2014 1602   CALCIUM 9.1 04/05/2017 2342   PROT 6.1 (L) 04/05/2017 2342   PROT 6.8 01/15/2017 1134   ALBUMIN 3.6 04/05/2017 2342   ALBUMIN 4.3 01/15/2017 1134   AST 14 (L) 04/05/2017 2342   ALT 7 (L) 04/05/2017 2342   ALKPHOS 68 04/05/2017 2342   BILITOT 0.6 04/05/2017 2342   BILITOT 0.5 01/15/2017 1134   GFRNONAA >60 04/05/2017 2342   GFRAA >60 04/05/2017 2342   Lipase     Component Value Date/Time   LIPASE 24 04/05/2017 2342    Studies/Results: Ct Abdomen Pelvis W Contrast  Result Date: 04/06/2017 CLINICAL DATA:  Dull aching pain in the left groin EXAM: CT ABDOMEN AND PELVIS WITH CONTRAST TECHNIQUE: Multidetector CT imaging of the abdomen and pelvis was performed using the standard protocol following bolus administration of  intravenous contrast. CONTRAST:  114mL ISOVUE-300 IOPAMIDOL (ISOVUE-300) INJECTION 61% COMPARISON:  10/21/2014 FINDINGS: Lower chest: Lung bases demonstrate patchy atelectasis in the left lower echo. Small left-sided pleural effusion. Cardiomegaly. Partially visualized sternotomy changes. Hepatobiliary: Small calcified stones in the gallbladder. No biliary dilatation. No focal hepatic abnormality Pancreas: Unremarkable. No pancreatic ductal dilatation or surrounding inflammatory changes. Spleen: Normal in size without focal abnormality. Adrenals/Urinary Tract: Adrenal glands are within normal limits. 6 mm stone in the mid pole of the left kidney. Small stones in the lower pole of the right kidney. Subcentimeter cortical  hypodense lesions too small to further characterize but probably cysts. Hyperdense 6 mm lesion superior pole right kidney, series 2, image number 22. The urinary bladder is unremarkable Stomach/Bowel: The stomach is nonenlarged. Postsurgical changes at the GE junction and distal stomach. Dilated loops of small bowel in the left lower quadrant measuring up to 3.8 cm. There appears to be a transition point related to a small left inguinal hernia, series 2, image number 65. Bowel distal to this is nonenlarged. No colon wall thickening. Vascular/Lymphatic: Atherosclerotic vascular disease. Patient is status post aorto iliac stent with patent appearing graft. Further decrease in size of the infrarenal abdominal aortic aneurysm measuring 3.2 cm AP, compared to 5.1 cm previously. No significantly enlarged abdominal or pelvic lymph nodes. Reproductive: Slightly enlarged prostate with calcifications Other: Negative for free air or significant free pelvic fluid. Musculoskeletal: Degenerative changes of the spine. No acute or suspicious bone lesion. IMPRESSION: 1. Dilated fluid-filled loops of small bowel in the left lower quadrant with suspected transition point related to a small left inguinal hernia. 2. Status post aorto iliac bypass graft with further decrease in size of the previously noted infrarenal aortic aneurysm. 3. Gallstones 4. Nonobstructing stones within both kidneys. Electronically Signed   By: Donavan Foil M.D.   On: 04/06/2017 01:33   Dg Chest Portable 1 View  Result Date: 04/06/2017 CLINICAL DATA:  NG tube placement EXAM: PORTABLE CHEST 1 VIEW COMPARISON:  04/17/2016 FINDINGS: Post sternotomy changes. Esophageal tube tip poorly visible but tubing seen to the mid stomach. Small left pleural effusion. Improving aeration at the left base. Cardiomegaly with vascular congestion. Tiny right effusion. No pneumothorax. IMPRESSION: 1. Esophageal tube being visualized to the mid stomach 2. Small pleural effusions  with improving aeration at the left base. 3. Cardiomegaly with vascular congestion Electronically Signed   By: Donavan Foil M.D.   On: 04/06/2017 03:09      Kalman Drape , [facility] Surgery 04/07/2017, 9:25 AM Pager: [phone] Consults: [phone] Mon-Fri 7:00 am-4:30 pm Sat-Sun 7:00 am-11:30 am

## 2017-04-07 NOTE — Progress Notes (Signed)
Patient said he mistakenly pulled out his NG tube. Will notify MD on call.

## 2017-04-07 NOTE — Consult Note (Signed)
Cardiology Consultation:   Patient ID: Tommy Turner; [id number]; 1941-04-19   Admit date: 04/05/2017 Date of Consult: 04/07/2017  Primary Care Provider: [name], MD Primary Cardiologist: No primary care provider on file.  Tommy Turner Primary Electrophysiologist: None   Patient Profile:   Tommy Turner is Turner 76 y.o. male with Turner hx of ischemic cardiomyopathy who is being seen today for the evaluation of preoperative risk stratification at the request of Tommy Turner.  History of Present Illness:   Tommy Turner is Turner 76 year old male with history of ischemic cardiomyopathy ejection fraction 20% with remote history of CABG in 2000 who last had Turner nuclear stress test in September 2015 showing large scar at the apex and inferior lateral wall with no ischemia, ejection fraction 24%, with history of endovascular aortic repair in May 2016, AAA, BPH/urinary retention here with incarcerated hernia requiring surgery.  In the past he has had stable NYHA class II-III symptoms.  He describes no angina.  No high risk valvular lesions.  No syncope.  His presenting symptoms were left groin pain.  CT of the abdomen demonstrated dilated loops of small bowel.      Past Medical History:  Diagnosis Date  . AAA (abdominal aortic aneurysm) ([place])   . Aneurysm ([place])   . Anxiety    takes Xanax daily as needed  . CAD (coronary artery disease)   . CHF (congestive heart failure) ([place])   . COPD (chronic obstructive pulmonary disease) ([facility])    Albuterol inhaler and neb daily as needed;takes SPiriva daily  . Depression    takes Celexa daily  . Foley catheter in place   . History of shingles   . Hyperlipidemia    takes Atorvastatin daily  . Hypertension    takes Metoprolol and Benazepril  daily  . Myocardial infarction ([place])    20+yrs ago  . Oxygen dependent    2L/[place]   . Peripheral edema    takes Furosemide daily  . Shortness of breath     on oxygen  2 liters continuous  . Ulcer    gastric     Past Surgical History:  Procedure Laterality Date  . ABDOMINAL AORTIC ENDOVASCULAR STENT GRAFT N/Turner 09/14/2014   Procedure: ABDOMINAL AORTIC ENDOVASCULAR STENT GRAFT;  Surgeon: Tommy Nashotah, MD;  Location: [facility];  Service: Vascular;  Laterality: N/Turner;  . CORONARY ARTERY BYPASS GRAFT  20+yrs ago   x 6  . ESOPHAGOGASTRODUODENOSCOPY    . HERNIA REPAIR Left    inguinal  . LIPOMA EXCISION     right lower back  . PARTIAL GASTRECTOMY     approx 1990  . TONSILLECTOMY     as Turner child  . TRANSURETHRAL RESECTION OF PROSTATE N/Turner 11/24/2015   Procedure: TRANSURETHRAL RESECTION OF THE PROSTATE (TURP);  Surgeon: Tommy Seal, MD;  Location: [facility];  Service: Urology;  Laterality: N/Turner;     Home Medications:  Prior to Admission medications   Medication Sig Start Date End Date Taking? Authorizing Provider  albuterol (PROVENTIL HFA;VENTOLIN HFA) 108 (90 Base) MCG/ACT inhaler Inhale 2 puffs into the lungs every 4 (four) hours as needed for wheezing or shortness of breath. 07/27/16  Yes Luking, Scott A, MD  albuterol (PROVENTIL) (2.5 MG/3ML) 0.083% nebulizer solution USE 1 VIAL BY NEBULIZATION EVERY 2 (TWO) HOURS AS NEEDED FOR WHEEZING OR SHORTNESS OF BREATH 12/18/16  Yes Tommy Drown, MD  aspirin EC 81 MG tablet Take 81 mg by mouth daily.    Yes  [provider]  clonazePAM (KLONOPIN) 0.5 MG tablet Take 1 tablet (0.5 mg total) by mouth 2 (two) times daily as needed. for anxiety 01/15/17  Yes Luking, Elayne Snare, MD  HYDROcodone-acetaminophen (NORCO) 10-325 MG tablet Take 0.5 tablets by mouth every 6 (six) hours as needed for severe pain. 01/15/17  Yes Luking, Elayne Snare, MD  metoprolol succinate (TOPROL-XL) 25 MG 24 hr tablet TAKE 1/2 TABLET BY MOUTH 2 TIMES DAILY. Patient taking differently: TAKE 1/2 TABLET BY MOUTH DAILY. 04/03/16  Yes Luking, Elayne Snare, MD  nitroGLYCERIN (NITROSTAT) 0.4 MG SL tablet Place 1 tablet (0.4 mg total) under the tongue every 5 (five) minutes as needed for chest pain. 08/02/15  Yes  Tommy Drown, MD  potassium chloride SA (K-DUR,KLOR-CON) 20 MEQ tablet TAKE 1 TABLET BY MOUTH ONCE DAILY Patient taking differently: TAKE 1 TABLET BY MOUTH ONCE EVERY OTHER DAY 02/26/17  Yes Tommy Drown, MD  SPIRIVA HANDIHALER 18 MCG inhalation capsule PLACE 1 CAPSULE INTO INHALER AND INHALE DAILY 02/26/17  Yes Luking, Scott A, MD  torsemide (DEMADEX) 20 MG tablet Take 1 tablet (20 mg total) by mouth daily. Patient taking differently: Take 20 mg by mouth every other day.  05/22/16  Yes Luking, Elayne Snare, MD  nicotine (NICODERM CQ) 14 mg/24hr patch Place 1 patch (14 mg total) onto the skin daily. Patient not taking: Reported on 04/05/2017 01/15/17   Tommy Drown, MD    Inpatient Medications: Scheduled Meds: . enoxaparin (LOVENOX) injection  40 mg Subcutaneous Q24H  . tiotropium  18 mcg Inhalation Daily   Continuous Infusions: . dextrose 5 % and 0.45% NaCl 100 mL/hr at 04/07/17 1016   PRN Meds: acetaminophen **OR** acetaminophen, albuterol, clonazePAM, fentaNYL (SUBLIMAZE) injection  Allergies:   No Known Allergies  Social History:   Social History   Socioeconomic History  . Marital status: Married    Spouse name: Not on file  . Number of children: Not on file  . Years of education: Not on file  . Highest education level: Not on file  Social Needs  . Financial resource strain: Not on file  . Food insecurity - worry: Not on file  . Food insecurity - inability: Not on file  . Transportation needs - medical: Not on file  . Transportation needs - non-medical: Not on file  Occupational History  . Not on file  Tobacco Use  . Smoking status: Former Smoker    Packs/day: 0.25    Years: 58.00    Pack years: 14.50    Types: Cigarettes    Last attempt to quit: 11/02/2014    Years since quitting: 2.4  . Smokeless tobacco: Never Used  . Tobacco comment: vapor ciggs  Substance and Sexual Activity  . Alcohol use: No    Alcohol/week: 0.0 oz  . Drug use: No  . Sexual activity:  Not Currently  Other Topics Concern  . Not on file  Social History Narrative  . Not on file    Family History:    Family History  Problem Relation Age of Onset  . Hypertension Brother   . Heart disease Brother        Heart Disease before age 27  . Heart attack Brother   . Cancer Brother   . Cancer Mother   . Diabetes Mother   . Heart disease Mother        Heart Disease before age 54  . Hypertension Mother   . Heart attack Mother   . Cancer  Father        prostate  . Hypertension Father   . COPD Sister   . Diabetes Sister   . Heart disease Sister   . Hypertension Sister   . Heart attack Sister   . Colon cancer Neg Hx      ROS:  Please see the history of present illness.  Denies any syncope, fevers, chest pain, orthopnea ROS  All other ROS reviewed and negative.     Physical Exam/Data:   Vitals:   04/06/17 2107 04/07/17 0611 04/07/17 0924 04/07/17 1405  BP: 107/60 (!) 100/54  (!) 95/46  Pulse: 80 78  66  Resp:    16  Temp: 98.9 F (37.2 C) 98.7 F (37.1 C)  98.1 F (36.7 C)  TempSrc: Oral Oral  Oral  SpO2: 96% 93% 93% 94%  Weight:      Height:        Intake/Output Summary (Last 24 hours) at 04/07/2017 1445 Last data filed at 04/07/2017 1404 Gross per 24 hour  Intake 1890 ml  Output 250 ml  Net 1640 ml   Filed Weights   04/05/17 2120 04/06/17 0614  Weight: 127 lb (57.6 kg) 131 lb 13.4 oz (59.8 kg)   Body mass index is 20.65 kg/m.  General: Thin, no acute distress HEENT: normal Lymph: no adenopathy Neck: no JVD Endocrine:  No thryomegaly Vascular: No carotid bruits  Cardiac:  normal S1, S2; RRR; no murmur  Lungs:  clear to auscultation bilaterally, no wheezing, rhonchi or rales  Abd: soft, nontender, no hepatomegaly, pulsatile Ext: no edema Musculoskeletal:  No deformities, BUE and BLE strength normal and equal Skin: warm and dry  Neuro:  CNs 2-12 intact, no focal abnormalities noted Psych:  Normal affect   EKG:  The EKG was personally  reviewed and demonstrates: 04/06/17 shows sinus rhythm with intraventricular conduction delay resembling right bundle branch block and underlying inferior lateral Q waves representing old infarct pattern.  Interestingly, prior EKG on 04/04/16 showed left bundle branch block.   Relevant CV Studies:  ECHO 04/09/16: - Left ventricle: The cavity size was mildly dilated. Wall   thickness was increased in Turner pattern of mild LVH. Systolic   function was severely reduced. The estimated ejection fraction   was 20%. There is akinesis of the inferior and inferoseptal   myocardium. There is dyskinesis of the mid-apicalanteroseptal and   apical myocardium. The study is not technically sufficient to   allow evaluation of LV diastolic function. - Aortic valve: Mildly calcified annulus. Trileaflet; mildly   calcified leaflets. - Aortic root: The aortic root was mildly dilated. - Mitral valve: Calcified annulus. Mildly thickened leaflets .   There was mild regurgitation. - Left atrium: The atrium was severely dilated. - Right ventricle: Systolic function was moderately reduced. - Right atrium: The atrium was mildly dilated. Central venous   pressure (est): 3 mm Hg. - Atrial septum: No defect or patent foramen ovale was identified. - Tricuspid valve: There was trivial regurgitation. - Pulmonary arteries: Systolic pressure could not be accurately   estimated. - Pericardium, extracardiac: There was no pericardial effusion.  Impressions:  - Mild LVH with mild chamber dilatation and LVEF approximately 20%.   Wall motion abnormalities consistent with ischemic   cardiomyopathy. Indeterminate diastolic function. Severe left   atrial enlargement. Mitral annular calcification with mild mitral   regurgitation. Mildly dilated aortic root. Moderately reduced   right ventricular contraction. Trivial tricuspid regurgitation.   Mild right atrial enlargement.  Laboratory Data:  Chemistry Recent Labs  Lab  04/05/17 2342  NA 142  K 4.0  CL 100*  CO2 38*  GLUCOSE 102*  BUN 16  CREATININE 0.37*  CALCIUM 9.1  GFRNONAA >60  GFRAA >60  ANIONGAP 4*    Recent Labs  Lab 04/05/17 2342  PROT 6.1*  ALBUMIN 3.6  AST 14*  ALT 7*  ALKPHOS 68  BILITOT 0.6   Hematology Recent Labs  Lab 04/05/17 2342 04/06/17 0712  WBC 8.0 11.7*  RBC 3.97* 3.95*  HGB 11.9* 11.8*  HCT 39.1 38.6*  MCV 98.5 97.7  MCH 30.0 29.9  MCHC 30.4 30.6  RDW 13.2 13.2  PLT 140* 125*   Cardiac EnzymesNo results for input(s): TROPONINI in the last 168 hours. No results for input(s): TROPIPOC in the last 168 hours.  BNPNo results for input(s): BNP, PROBNP in the last 168 hours.  DDimer No results for input(s): DDIMER in the last 168 hours.  Radiology/Studies:  Ct Abdomen Pelvis W Contrast  Result Date: 04/06/2017 CLINICAL DATA:  Dull aching pain in the left groin EXAM: CT ABDOMEN AND PELVIS WITH CONTRAST TECHNIQUE: Multidetector CT imaging of the abdomen and pelvis was performed using the standard protocol following bolus administration of intravenous contrast. CONTRAST:  129mL ISOVUE-300 IOPAMIDOL (ISOVUE-300) INJECTION 61% COMPARISON:  10/21/2014 FINDINGS: Lower chest: Lung bases demonstrate patchy atelectasis in the left lower echo. Small left-sided pleural effusion. Cardiomegaly. Partially visualized sternotomy changes. Hepatobiliary: Small calcified stones in the gallbladder. No biliary dilatation. No focal hepatic abnormality Pancreas: Unremarkable. No pancreatic ductal dilatation or surrounding inflammatory changes. Spleen: Normal in size without focal abnormality. Adrenals/Urinary Tract: Adrenal glands are within normal limits. 6 mm stone in the mid pole of the left kidney. Small stones in the lower pole of the right kidney. Subcentimeter cortical hypodense lesions too small to further characterize but probably cysts. Hyperdense 6 mm lesion superior pole right kidney, series 2, image number 22. The urinary bladder  is unremarkable Stomach/Bowel: The stomach is nonenlarged. Postsurgical changes at the GE junction and distal stomach. Dilated loops of small bowel in the left lower quadrant measuring up to 3.8 cm. There appears to be Turner transition point related to Turner small left inguinal hernia, series 2, image number 65. Bowel distal to this is nonenlarged. No colon wall thickening. Vascular/Lymphatic: Atherosclerotic vascular disease. Patient is status post aorto iliac stent with patent appearing graft. Further decrease in size of the infrarenal abdominal aortic aneurysm measuring 3.2 cm AP, compared to 5.1 cm previously. No significantly enlarged abdominal or pelvic lymph nodes. Reproductive: Slightly enlarged prostate with calcifications Other: Negative for free air or significant free pelvic fluid. Musculoskeletal: Degenerative changes of the spine. No acute or suspicious bone lesion. IMPRESSION: 1. Dilated fluid-filled loops of small bowel in the left lower quadrant with suspected transition point related to Turner small left inguinal hernia. 2. Status post aorto iliac bypass graft with further decrease in size of the previously noted infrarenal aortic aneurysm. 3. Gallstones 4. Nonobstructing stones within both kidneys. Electronically Signed   By: Donavan Foil M.D.   On: 04/06/2017 01:33   Dg Chest Portable 1 View  Result Date: 04/06/2017 CLINICAL DATA:  NG tube placement EXAM: PORTABLE CHEST 1 VIEW COMPARISON:  04/17/2016 FINDINGS: Post sternotomy changes. Esophageal tube tip poorly visible but tubing seen to the mid stomach. Small left pleural effusion. Improving aeration at the left base. Cardiomegaly with vascular congestion. Tiny right effusion. No pneumothorax. IMPRESSION: 1. Esophageal tube being visualized to the mid stomach 2.  Small pleural effusions with improving aeration at the left base. 3. Cardiomegaly with vascular congestion Electronically Signed   By: Donavan Foil M.D.   On: 04/06/2017 03:09    Assessment  and Plan:   Preoperative cardiac risk stratification -He does have underlying ischemic cardiomyopathy with ejection fraction of 20% however this has been stable and he is not demonstrating any evidence of acute on chronic systolic heart failure.  NYHA class II-III symptoms have been present.  This is standard for him.  Prior nuclear stress test demonstrated large scar with no ischemia indicative of his prior myocardial infarction.  He has had bypass remotely in the year 2000. -I do believe that he may proceed with surgery with moderate to high cardiac risk based upon his reduced ejection fraction and extensive cardiovascular history.  I would not prohibit him from proceeding however. -Consider adding back low-dose Toprol as on home medication list once blood pressure is able to tolerate. - He appears dry.  No need for torsemide at this time.  Ischemic cardiomyopathy/CAD -Seems euvolemic, stable. -No changes made in medications. - Will be careful with possible overzealous fluid administration.  AAA repair endovascular -Stable  Protein calorie malnutrition -Encourage caloric intake.  COPD - Barrel chested noted on exam.  No active wheezing.  We will follow along.   For questions or updates, please contact [place] Please consult [email or web address] for contact info under Cardiology/STEMI.   Signed, Candee Furbish, MD  04/07/2017 2:45 PM

## 2017-04-08 ENCOUNTER — Encounter (HOSPITAL_COMMUNITY)
Admission: EM | Disposition: A | Discharge status: Home / Self Care | Payer: Self-pay | Source: Home / Self Care | Attending: Internal Medicine

## 2017-04-08 ENCOUNTER — Inpatient Hospital Stay (HOSPITAL_COMMUNITY): Payer: 59

## 2017-04-08 ENCOUNTER — Inpatient Hospital Stay (HOSPITAL_COMMUNITY): Payer: 59 | Admitting: Anesthesiology

## 2017-04-08 ENCOUNTER — Encounter (HOSPITAL_COMMUNITY): Payer: Self-pay | Admitting: Certified Registered Nurse Anesthetist

## 2017-04-08 DIAGNOSIS — Z0189 Encounter for other specified special examinations: Secondary | ICD-10-CM

## 2017-04-08 DIAGNOSIS — J9622 Acute and chronic respiratory failure with hypercapnia: Secondary | ICD-10-CM

## 2017-04-08 DIAGNOSIS — J9621 Acute and chronic respiratory failure with hypoxia: Secondary | ICD-10-CM

## 2017-04-08 HISTORY — PX: FEMORAL HERNIA REPAIR: SHX632

## 2017-04-08 HISTORY — PX: INSERTION OF MESH: SHX5868

## 2017-04-08 HISTORY — PX: LAPAROTOMY: SHX154

## 2017-04-08 HISTORY — PX: BOWEL RESECTION: SHX1257

## 2017-04-08 LAB — BASIC METABOLIC PANEL
Anion gap: 4 — ABNORMAL LOW (ref 5–15)
CALCIUM: 8.8 mg/dL — AB (ref 8.9–10.3)
CHLORIDE: 100 mmol/L — AB (ref 101–111)
CO2: 37 mmol/L — ABNORMAL HIGH (ref 22–32)
CREATININE: 0.46 mg/dL — AB (ref 0.61–1.24)
GFR calc non Af Amer: >60 mL/min (ref >60)
Glucose, Bld: 95 mg/dL (ref 65–99)
Potassium: 3.7 mmol/L (ref 3.5–5.1)
SODIUM: 141 mmol/L (ref 135–145)

## 2017-04-08 LAB — BLOOD GAS, ARTERIAL
ACID-BASE EXCESS: 8.9 mmol/L — AB (ref 0.0–2.0)
BICARBONATE: 34.5 mmol/L — AB (ref 20.0–28.0)
DRAWN BY: 358491
FIO2: 40
O2 Saturation: 88.8 %
PH ART: 7.354 (ref 7.350–7.450)
Patient temperature: 98.6
pCO2 arterial: 63.5 mmHg — ABNORMAL HIGH (ref 32.0–48.0)
pO2, Arterial: 64.4 mmHg — ABNORMAL LOW (ref 83.0–108.0)

## 2017-04-08 LAB — CBC
HCT: 40.5 % (ref 39.0–52.0)
HEMOGLOBIN: 12.4 g/dL — AB (ref 13.0–17.0)
MCH: 29.5 pg (ref 26.0–34.0)
MCHC: 30.6 g/dL (ref 30.0–36.0)
MCV: 96.2 fL (ref 78.0–100.0)
Platelets: 126 10*3/uL — ABNORMAL LOW (ref 150–400)
RBC: 4.21 MIL/uL — ABNORMAL LOW (ref 4.22–5.81)
RDW: 13.3 % (ref 11.5–15.5)
WBC: 11.4 10*3/uL — ABNORMAL HIGH (ref 4.0–10.5)

## 2017-04-08 LAB — GLUCOSE, CAPILLARY: GLUCOSE-CAPILLARY: 143 mg/dL — AB (ref 65–99)

## 2017-04-08 SURGERY — HERNIA REPAIR FEMORAL
Anesthesia: General | Site: Abdomen | Laterality: Left

## 2017-04-08 MED ORDER — BUPIVACAINE-EPINEPHRINE 0.25% -1:200000 IJ SOLN
INTRAMUSCULAR | Status: DC | PRN
  Administered 2017-04-08: 19 mL

## 2017-04-08 MED ORDER — SUGAMMADEX SODIUM 200 MG/2ML IV SOLN
INTRAVENOUS | Status: AC
  Filled 2017-04-08: qty 2

## 2017-04-08 MED ORDER — MIDAZOLAM HCL 2 MG/2ML IJ SOLN
INTRAMUSCULAR | Status: AC
  Filled 2017-04-08: qty 2

## 2017-04-08 MED ORDER — FENTANYL CITRATE (PF) 100 MCG/2ML IJ SOLN: 25.0000 ug | INTRAMUSCULAR | Status: DC | PRN

## 2017-04-08 MED ORDER — LIDOCAINE 2% (20 MG/ML) 5 ML SYRINGE
INTRAMUSCULAR | Status: AC
  Filled 2017-04-08: qty 5

## 2017-04-08 MED ORDER — PROPOFOL 10 MG/ML IV BOLUS
INTRAVENOUS | Status: DC | PRN
  Administered 2017-04-08: 70 mg via INTRAVENOUS

## 2017-04-08 MED ORDER — ROCURONIUM BROMIDE 10 MG/ML (PF) SYRINGE
PREFILLED_SYRINGE | INTRAVENOUS | Status: DC | PRN
  Administered 2017-04-08: 20 mg via INTRAVENOUS
  Administered 2017-04-08: 30 mg via INTRAVENOUS
  Administered 2017-04-08: 10 mg via INTRAVENOUS

## 2017-04-08 MED ORDER — SUGAMMADEX SODIUM 200 MG/2ML IV SOLN
INTRAVENOUS | Status: DC | PRN
  Administered 2017-04-08: 125 mg via INTRAVENOUS

## 2017-04-08 MED ORDER — LIDOCAINE 2% (20 MG/ML) 5 ML SYRINGE
INTRAMUSCULAR | Status: DC | PRN
Start: 2017-04-08 — End: 2017-04-08
  Administered 2017-04-08: 100 mg via INTRAVENOUS

## 2017-04-08 MED ORDER — ROCURONIUM BROMIDE 10 MG/ML (PF) SYRINGE
PREFILLED_SYRINGE | INTRAVENOUS | Status: AC
  Filled 2017-04-08: qty 5

## 2017-04-08 MED ORDER — PHENYLEPHRINE 40 MCG/ML (10ML) SYRINGE FOR IV PUSH (FOR BLOOD PRESSURE SUPPORT)
PREFILLED_SYRINGE | INTRAVENOUS | Status: AC
  Filled 2017-04-08: qty 10

## 2017-04-08 MED ORDER — DEXAMETHASONE SODIUM PHOSPHATE 10 MG/ML IJ SOLN
INTRAMUSCULAR | Status: DC | PRN
  Administered 2017-04-08: 10 mg via INTRAVENOUS

## 2017-04-08 MED ORDER — MEPERIDINE HCL 25 MG/ML IJ SOLN: 6.2500 mg | INTRAMUSCULAR | Status: DC | PRN

## 2017-04-08 MED ORDER — DEXAMETHASONE SODIUM PHOSPHATE 10 MG/ML IJ SOLN
INTRAMUSCULAR | Status: AC
  Filled 2017-04-08: qty 1

## 2017-04-08 MED ORDER — ROPIVACAINE HCL 7.5 MG/ML IJ SOLN
INTRAMUSCULAR | Status: DC | PRN
  Administered 2017-04-08: 25 mL via PERINEURAL

## 2017-04-08 MED ORDER — BUPIVACAINE-EPINEPHRINE (PF) 0.25% -1:200000 IJ SOLN
INTRAMUSCULAR | Status: AC
  Filled 2017-04-08: qty 30

## 2017-04-08 MED ORDER — FENTANYL CITRATE (PF) 250 MCG/5ML IJ SOLN
INTRAMUSCULAR | Status: AC
  Filled 2017-04-08: qty 5

## 2017-04-08 MED ORDER — CEFAZOLIN SODIUM-DEXTROSE 2-4 GM/100ML-% IV SOLN
INTRAVENOUS | Status: AC
  Filled 2017-04-08: qty 100

## 2017-04-08 MED ORDER — FENTANYL CITRATE (PF) 100 MCG/2ML IJ SOLN
INTRAMUSCULAR | Status: AC
  Filled 2017-04-08: qty 2

## 2017-04-08 MED ORDER — ONDANSETRON HCL 4 MG/2ML IJ SOLN
INTRAMUSCULAR | Status: DC | PRN
  Administered 2017-04-08: 4 mg via INTRAVENOUS

## 2017-04-08 MED ORDER — ONDANSETRON HCL 4 MG/2ML IJ SOLN
INTRAMUSCULAR | Status: AC
  Filled 2017-04-08: qty 2

## 2017-04-08 MED ORDER — LACTATED RINGERS IV SOLN
INTRAVENOUS | Status: DC
  Administered 2017-04-08 (×2): via INTRAVENOUS

## 2017-04-08 MED ORDER — PHENYLEPHRINE HCL 10 MG/ML IJ SOLN
INTRAVENOUS | Status: DC | PRN
  Administered 2017-04-08: 25 ug/min via INTRAVENOUS

## 2017-04-08 MED ORDER — EPHEDRINE SULFATE-NACL 50-0.9 MG/10ML-% IV SOSY
PREFILLED_SYRINGE | INTRAVENOUS | Status: DC | PRN
  Administered 2017-04-08: 5 mg via INTRAVENOUS

## 2017-04-08 MED ORDER — HYDROMORPHONE HCL 1 MG/ML IJ SOLN
1.0000 mg | INTRAMUSCULAR | Status: DC | PRN
  Administered 2017-04-08: 1 mg via INTRAVENOUS
  Filled 2017-04-08: qty 1

## 2017-04-08 MED ORDER — 0.9 % SODIUM CHLORIDE (POUR BTL) OPTIME
TOPICAL | Status: DC | PRN
  Administered 2017-04-08: 1000 mL

## 2017-04-08 MED ORDER — FENTANYL CITRATE (PF) 100 MCG/2ML IJ SOLN
50.0000 ug | Freq: Once | INTRAMUSCULAR | Status: AC
  Administered 2017-04-08: 50 ug via INTRAVENOUS

## 2017-04-08 MED ORDER — PHENYLEPHRINE 40 MCG/ML (10ML) SYRINGE FOR IV PUSH (FOR BLOOD PRESSURE SUPPORT)
PREFILLED_SYRINGE | INTRAVENOUS | Status: DC | PRN
  Administered 2017-04-08: 80 ug via INTRAVENOUS
  Administered 2017-04-08: 40 ug via INTRAVENOUS

## 2017-04-08 MED ORDER — FENTANYL CITRATE (PF) 100 MCG/2ML IJ SOLN
INTRAMUSCULAR | Status: DC | PRN
  Administered 2017-04-08 (×2): 25 ug via INTRAVENOUS

## 2017-04-08 MED ORDER — CEFAZOLIN SODIUM-DEXTROSE 2-4 GM/100ML-% IV SOLN
2.0000 g | Freq: Once | INTRAVENOUS | Status: AC
  Administered 2017-04-08: 2 g via INTRAVENOUS

## 2017-04-08 MED ORDER — PROPOFOL 1000 MG/100ML IV EMUL
INTRAVENOUS | Status: AC
  Filled 2017-04-08: qty 100

## 2017-04-08 MED ORDER — EPHEDRINE 5 MG/ML INJ
INTRAVENOUS | Status: AC
  Filled 2017-04-08: qty 10

## 2017-04-08 MED ORDER — SUGAMMADEX SODIUM 500 MG/5ML IV SOLN
INTRAVENOUS | Status: AC
  Filled 2017-04-08: qty 5

## 2017-04-08 SURGICAL SUPPLY — 52 items
ADH SKN CLS APL DERMABOND .7 (GAUZE/BANDAGES/DRESSINGS) ×3
BLADE CLIPPER SURG (BLADE) ×2 IMPLANT
CANISTER SUCT 3000ML PPV (MISCELLANEOUS) ×2 IMPLANT
CHLORAPREP W/TINT 26ML (MISCELLANEOUS) ×5 IMPLANT
COVER SURGICAL LIGHT HANDLE (MISCELLANEOUS) ×5 IMPLANT
DERMABOND ADVANCED (GAUZE/BANDAGES/DRESSINGS) ×2
DERMABOND ADVANCED .7 DNX12 (GAUZE/BANDAGES/DRESSINGS) ×3 IMPLANT
DRAIN PENROSE 1/2X12 LTX STRL (WOUND CARE) ×2 IMPLANT
DRAPE LAPAROTOMY TRNSV 102X78 (DRAPE) ×5 IMPLANT
DRAPE UTILITY XL STRL (DRAPES) ×8 IMPLANT
DRSG OPSITE POSTOP 4X6 (GAUZE/BANDAGES/DRESSINGS) ×2 IMPLANT
DRSG OPSITE POSTOP 4X8 (GAUZE/BANDAGES/DRESSINGS) ×2 IMPLANT
ELECT CAUTERY BLADE 6.4 (BLADE) ×5 IMPLANT
ELECT REM PT RETURN 9FT ADLT (ELECTROSURGICAL) ×5
ELECTRODE REM PT RTRN 9FT ADLT (ELECTROSURGICAL) ×3 IMPLANT
GLOVE BIO SURGEON STRL SZ8 (GLOVE) ×5 IMPLANT
GLOVE BIOGEL PI IND STRL 8 (GLOVE) ×3 IMPLANT
GLOVE BIOGEL PI INDICATOR 8 (GLOVE) ×2
GOWN STRL REUS W/ TWL LRG LVL3 (GOWN DISPOSABLE) ×3 IMPLANT
GOWN STRL REUS W/ TWL XL LVL3 (GOWN DISPOSABLE) ×3 IMPLANT
GOWN STRL REUS W/TWL LRG LVL3 (GOWN DISPOSABLE) ×10
GOWN STRL REUS W/TWL XL LVL3 (GOWN DISPOSABLE) ×5
HANDLE SUCTION POOLE (INSTRUMENTS) IMPLANT
KIT BASIN OR (CUSTOM PROCEDURE TRAY) ×5 IMPLANT
KIT ROOM TURNOVER OR (KITS) ×5 IMPLANT
MESH HERNIA SYS ULTRAPRO LRG (Mesh General) ×2 IMPLANT
NDL HYPO 25GX1X1/2 BEV (NEEDLE) ×3 IMPLANT
NEEDLE HYPO 25GX1X1/2 BEV (NEEDLE) ×5 IMPLANT
NS IRRIG 1000ML POUR BTL (IV SOLUTION) ×5 IMPLANT
PACK SURGICAL SETUP 50X90 (CUSTOM PROCEDURE TRAY) ×5 IMPLANT
PAD ARMBOARD 7.5X6 YLW CONV (MISCELLANEOUS) ×5 IMPLANT
PENCIL BUTTON HOLSTER BLD 10FT (ELECTRODE) ×5 IMPLANT
RELOAD PROXIMATE 75MM BLUE (ENDOMECHANICALS) ×10 IMPLANT
RELOAD STAPLE 75 3.8 BLU REG (ENDOMECHANICALS) IMPLANT
SPONGE LAP 18X18 X RAY DECT (DISPOSABLE) ×9 IMPLANT
STAPLER GUN LINEAR PROX 60 (STAPLE) ×2 IMPLANT
STAPLER PROXIMATE 75MM BLUE (STAPLE) ×2 IMPLANT
STAPLER VISISTAT 35W (STAPLE) ×2 IMPLANT
SUCTION POOLE HANDLE (INSTRUMENTS) ×5
SUT MNCRL AB 4-0 PS2 18 (SUTURE) ×5 IMPLANT
SUT NOVA NAB DX-16 0-1 5-0 T12 (SUTURE) ×9 IMPLANT
SUT PDS AB 1 TP1 96 (SUTURE) ×6 IMPLANT
SUT VIC AB 2-0 SH 27 (SUTURE) ×5
SUT VIC AB 2-0 SH 27X BRD (SUTURE) ×3 IMPLANT
SUT VIC AB 3-0 SH 18 (SUTURE) ×9 IMPLANT
SUT VICRYL AB 3 0 TIES (SUTURE) ×5 IMPLANT
SYR BULB 3OZ (MISCELLANEOUS) ×2 IMPLANT
SYR CONTROL 10ML LL (SYRINGE) ×5 IMPLANT
TOWEL OR 17X24 6PK STRL BLUE (TOWEL DISPOSABLE) ×5 IMPLANT
TUBE CONNECTING 12'X1/4 (SUCTIONS) ×1
TUBE CONNECTING 12X1/4 (SUCTIONS) ×1 IMPLANT
YANKAUER SUCT BULB TIP NO VENT (SUCTIONS) ×2 IMPLANT

## 2017-04-08 NOTE — Interval H&P Note (Signed)
History and Physical Interval Note:  04/08/2017 9:05 AM  Tommy Turner  has presented today for surgery, with the diagnosis of LEFT INGUINAL HERNIA  The various methods of treatment have been discussed with the patient and family. After consideration of risks, benefits and other options for treatment, the patient has consented to  Procedure(s): HERNIA REPAIR INGUINAL ADULT (Left) as a surgical intervention .  The patient's history has been reviewed, patient examined, no change in status, stable for surgery.  I have reviewed the patient's chart and labs.  Questions were answered to the patient's satisfaction.     [facility]

## 2017-04-08 NOTE — Progress Notes (Signed)
PROGRESS NOTE  Tommy Turner YSA:[id number] DOB: 11-27-1940 DOA: 04/05/2017 PCP: [name], MD  HPI/Recap of past 24 hours: Tommy Turner is a 76 y.o. male with medical history significant for AAA status post repair, chronic systolic CHF with EF 32% (2017), CAD, ischemic CM, COPD, chronic respiratory failure, anxiety disorder, and chronic pain, now presenting to the emergency department for evaluation of left groin pain 04/05/17. Onset 2 days ago. Had a left inguinal hernia repair approximately 10 years ago. Left inguinal Hernia was incarcerated then reduced at the bedside by general surgery at [facility].  CT of the abdomen and pelvis 04/06/17 demonstrates dilated loops of small bowel in the left lower quadrant with transition point related to the small left inguinal hernia.  NG tube was placed and the patient was treated with IV fluids, fentanyl, and Zofran. General surgery following.   Pt seen and examined with his wife at his bedside. POD#0 post left hernia repair. Rapid response called due to acute respiratory distress post surgery.  Pain is currently well controlled. Alert and oriented x3.   Assessment/Plan: Principal Problem:   Incarcerated inguinal hernia, unilateral Active Problems:   Essential hypertension   COPD with hypoxia ([facility])   CAD (coronary artery disease)   Anxiety   Chronic pain syndrome   Chronic respiratory failure with hypoxia and hypercapnia ([facility])   Chronic systolic CHF (congestive heart failure) ([facility])   Thrombocytopenia ([facility])   Incarcerated inguinal hernia  POD #0 post repair left inguinal hernia incarceration -Reduced by general surgery 04/07/17 -general surgery following. -We appreciate gen surgery recommendations -pain management in place -norco 10-325 prn for severe pain -NG tube placed by gen surgery 04/08/17 -closely monitor symptoms  Acute on chronic hypoxic hypercarbic respiratory failure -ABG 04/08/17 PCO2 63 PO2 64 -on venti  mask and sat 35%  Chronic systolic heart failure with EF 20% (04/2016) -appears stable, in no acute exacerbation -asa, metoprolol, torsemide -no statin in home med list  Ischemic cardiomyopathy -management as stated above  S/p endovascular repair AAA -no acute issues  COPD, no acute exacerbation -stable -proventin, spiriva  Tobacco use disorder -nicotine patch -tobacco cessation counseling  Anxiety -stable -klonopin  Code Status: DNR  Family Communication: Wife at bedside. All questions answered to her satisfaction.  Disposition Plan: Will stay another midnight to closely monitor symptoms.  Consultants:  General surgery  Procedures:  Left inguinal hernia manual reduction  Surgery today 04/08/17  Antimicrobials:  None indicated  DVT prophylaxis:  lovenox 40 mg sq daily   Objective: Vitals:   04/08/17 1315 04/08/17 1330 04/08/17 1347 04/08/17 1528  BP: (!) 107/53 (!) 105/58 (!) 109/55 (!) 106/56  Pulse: 83 84 85 77  Resp: (!) 25 (!) 25 (!) 24   Temp:  98 F (36.7 C) 98.1 F (36.7 C) 98.1 F (36.7 C)  TempSrc:    Oral  SpO2: 93% 98% 100% 94%  Weight:      Height:        Intake/Output Summary (Last 24 hours) at 04/08/2017 1721 Last data filed at 04/08/2017 1336 Gross per 24 hour  Intake 1050 ml  Output 275 ml  Net 775 ml   Filed Weights   04/05/17 2120 04/06/17 0614  Weight: 57.6 kg (127 lb) 59.8 kg (131 lb 13.4 oz)    Exam:   General:  76 yo CM thin built laying in bed in NAD. A&O x 3  Cardiovascular: RRR no rubs or gallops  Respiratory: CTA no wheezes  or rales  Abdomen: soft NT ND NBSx 4 quadrants  Musculoskeletal: left inguinal region is covered with surgical dressing. Appears clean.  Skin: No noted rashes  Psychiatry: Mood is appropriate for condition and setting   Data Reviewed: CBC: Recent Labs  Lab 04/05/17 2342 04/06/17 0712 04/08/17 0507  WBC 8.0 11.7* 11.4*  NEUTROABS 6.0 9.7*  --   HGB 11.9* 11.8* 12.4*  HCT  39.1 38.6* 40.5  MCV 98.5 97.7 96.2  PLT 140* 125* 818*   Basic Metabolic Panel: Recent Labs  Lab 04/05/17 2342 04/08/17 0507  NA 142 141  K 4.0 3.7  CL 100* 100*  CO2 38* 37*  GLUCOSE 102* 95  BUN 16 <5*  CREATININE 0.37* 0.46*  CALCIUM 9.1 8.8*   GFR: Estimated Creatinine Clearance: 65.4 mL/min (A) (by C-G formula based on SCr of 0.46 mg/dL (L)). Liver Function Tests: Recent Labs  Lab 04/05/17 2342  AST 14*  ALT 7*  ALKPHOS 68  BILITOT 0.6  PROT 6.1*  ALBUMIN 3.6   Recent Labs  Lab 04/05/17 2342  LIPASE 24   No results for input(s): AMMONIA in the last 168 hours. Coagulation Profile: No results for input(s): INR, PROTIME in the last 168 hours. Cardiac Enzymes: No results for input(s): CKTOTAL, CKMB, CKMBINDEX, TROPONINI in the last 168 hours. BNP (last 3 results) No results for input(s): PROBNP in the last 8760 hours. HbA1C: No results for input(s): HGBA1C in the last 72 hours. CBG: Recent Labs  Lab 04/06/17 0610 04/07/17 0615 04/07/17 0814 04/08/17 1639  GLUCAP 87 77 72 143*   Lipid Profile: No results for input(s): CHOL, HDL, LDLCALC, TRIG, CHOLHDL, LDLDIRECT in the last 72 hours. Thyroid Function Tests: No results for input(s): TSH, T4TOTAL, FREET4, T3FREE, THYROIDAB in the last 72 hours. Anemia Panel: No results for input(s): VITAMINB12, FOLATE, FERRITIN, TIBC, IRON, RETICCTPCT in the last 72 hours. Urine analysis:    Component Value Date/Time   COLORURINE YELLOW 04/06/2017 0025   APPEARANCEUR CLEAR 04/06/2017 0025   LABSPEC 1.023 04/06/2017 0025   PHURINE 6.0 04/06/2017 0025   GLUCOSEU NEGATIVE 04/06/2017 0025   HGBUR NEGATIVE 04/06/2017 0025   BILIRUBINUR NEGATIVE 04/06/2017 0025   KETONESUR NEGATIVE 04/06/2017 0025   PROTEINUR NEGATIVE 04/06/2017 0025   UROBILINOGEN 1.0 09/10/2014 0919   NITRITE NEGATIVE 04/06/2017 0025   LEUKOCYTESUR NEGATIVE 04/06/2017 0025   Sepsis Labs: @LABRCNTIP (procalcitonin:4,lacticidven:4)  ) Recent  Results (from the past 240 hour(s))  Urine Culture     Status: None   Collection Time: 04/06/17 12:25 AM  Result Value Ref Range Status   Specimen Description URINE, CLEAN CATCH  Final   Special Requests NONE  Final   Culture   Final    NO GROWTH Performed at [facility], [place] [address], [place], [place] [address]    Report Status 04/07/2017 FINAL  Final      Studies: Dg Abd Portable 1v  Result Date: 04/08/2017 CLINICAL DATA:  NG tube placement. EXAM: PORTABLE ABDOMEN - 1 VIEW COMPARISON:  None. FINDINGS: AP semi-upright view of the abdomen at 1651 hours shows the tip of an NG tube over the mid mediastinum. This appears to project just below the carina suggesting esophageal placement. Left base collapse/ consolidation evident. Gaseous colon distention noted in this patient status post aortic endograft placement. IMPRESSION: NG tube tip projects over the mid mediastinum and appears to be just below the level of the carina suggesting esophageal placement. Nevertheless, correlation for airway placement recommended. Electronically Signed  By: Misty Stanley M.D.   On: 04/08/2017 17:18    Scheduled Meds: . enoxaparin (LOVENOX) injection  40 mg Subcutaneous Q24H  . fentaNYL      . midazolam      . tiotropium  18 mcg Inhalation Daily    Continuous Infusions: . dextrose 5 % and 0.45% NaCl 100 mL/hr at 04/07/17 2023  . lactated ringers 10 mL/hr at 04/08/17 0835     LOS: 2 days     Kayleen Memos, MD [facility] Pager [phone]  If 7PM-7AM, please contact night-coverage [email or web address] Password [email or web address] 04/08/2017, 5:21 PM

## 2017-04-08 NOTE — Progress Notes (Signed)
    Tolerated surgery from a cardiovascular standpoint.  We will continue to follow in chart.  Candee Furbish, MD

## 2017-04-08 NOTE — Progress Notes (Signed)
Patient's continuous pulse ox alarming. O2 sat reading 78-80'a at this time. Patient's o2 increased to 4L o2 [place], only increasing sat to 83%. Respiratory and RR Nurse called to bedside. Blood pressure and heart rate stable. Patient placed on nonrebreather mask, o2 sats increased to 93%.  Dr. [place] Crane paged with no return call at this time. Wife at bedside. Dressings to abdomen c/d/i. Patient able to be placed back down to 5L o2 [place], sats 89-90%. Vitals stable.

## 2017-04-08 NOTE — Progress Notes (Signed)
Spoke with Dr. Donne Hazel regarding patient's xray for NGT placement. Dr. Donne Hazel states to remove NGT and to not replace it at this time. NGT removed, no issues. Patient continues to be confused and on Venturi Mask with o2 sat 99%.

## 2017-04-08 NOTE — Anesthesia Procedure Notes (Signed)
Anesthesia Regional Block: TAP block   Pre-Anesthetic Checklist: ,, timeout performed, Correct Patient, Correct Site, Correct Laterality, Correct Procedure, Correct Position, site marked, Risks and benefits discussed,  Surgical consent,  Pre-op evaluation,  At surgeon's request and post-op pain management  Laterality: Left  Prep: chloraprep       Needles:  Injection technique: Single-shot  Needle Type: Echogenic Stimulator Needle     Needle Length: 5cm  Needle Gauge: 22     Additional Needles:   Procedures:, nerve stimulator,,, ultrasound used (permanent image in chart),,,,  Narrative:  Start time: 04/08/2017 8:50 AM End time: 04/08/2017 8:56 AM Injection made incrementally with aspirations every 5 mL.  Performed by: Personally  Anesthesiologist: Janeece Riggers, MD  Additional Notes: Functioning IV was confirmed and monitors were applied.  A 31mm 22ga Arrow echogenic stimulator needle was used. Sterile prep and drape,hand hygiene and sterile gloves were used. Ultrasound guidance: relevant anatomy identified, needle position confirmed, local anesthetic spread visualized around nerve(s)., vascular puncture avoided.  Image printed for medical record. Negative aspiration and negative test dose prior to incremental administration of local anesthetic. The patient tolerated the procedure well.

## 2017-04-08 NOTE — Progress Notes (Signed)
Patient continues to be confused and lethargic, can now tell nurse where he is. O2 sats 88-90% on room air. Dr. [place] Crane now at bedside to evaluate patient. States to put him on venturi mask. ABG ordered and orders to check placement of NGT. NGT placement was not checked in PACU after placement.   Patient having shallow breathing, RR in the high 20's-30's. Lung sounds significantly decreased. Dressings to abdomen intact, old drainage noted.

## 2017-04-08 NOTE — Op Note (Signed)
Preoperative diagnosis: Recurrent left inguinal hernia with history of incarceration and subsequent reduction  Postoperative diagnosis: Incarcerated left femoral hernia with small area of ischemia involving distal ileum from previous incarceration and subsequent reduction   Procedure: Exploratory laparotomy with small bowel resection and repair of left femoral hernia with mesh  Surgeon: Erroll Luna MD  Assistant :  Janett Billow    PA   Anesthesia: General with local  EBL: 40 cc  Specimen: Distal small SEGMENT to pathology  Drains: None  Indications for procedure: The patient is a very frail 76 year old male who was seen in any pin emergency room on Friday.  He had an incarcerated recurrent left inguinal hernia by CT scanning which was reduced by the surgeon on-call there.  Given his severe COPD and heart disease he was transferred to [facility] for more definitive management.  Upon arrival he was much improved after reduction.  He still complains of left groin pain but his bowel obstructive symptoms he had were gone.  He was evaluated by medicine.  He was evaluated by cardiology.  He was considered moderate to high risk for surgery.  He still had pain and discomfort in his left groin.  His bowel obstruction had resolved but I recommended repair given his frail state and concern for recurrence.  I discussed the operation with he and his wife at great length.  I outlined his elevated operative risk and other options that were nonoperative.  I did not feel that observation his case would be best long-term for him.The risk of hernia repair include bleeding,  Infection,   Recurrence of the hernia,  Mesh use, chronic pain,  Organ injury,  Bowel injury,  Bladder injury,   nerve injury with numbness around the incision,  Death,  and worsening of preexisting  medical problems.  The alternatives to surgery have been discussed as well..  Long term expectations of both operative and non operative treatments have  been discussed.   The patient agrees to proceed.    Description of procedure: The patient was brought to the operating room.  The left side was marked as the correct side.  After induction of general anesthesia the abdomen was prepped and draped in sterile fashion.  Timeout was done.  Previous left groin scar was noted.  This was opened with a scalpel.  Dissection was carried down through the subcutaneous fat until the external oblique aponeurosis was identified and opened.  He had significant scar tissue.  Upon examination the floor of his inguinal canal was intact.  He had no indirect hernia.  He did have what appeared to be a left femoral hernia.  This was evident below the inguinal ligament.  There appeared to be incarcerated material in the hernia.  This was opened up carefully.  This appeared to be hernia sac when this was opened at the appearance of bowel mucosa.  It was unclear since I could not reduce this at all all if a part of the small bowel was incarcerated in this.  I opted and opted to open the floor of the inguinal canal through the femoral canal.  This did not give any better exposure due to the dense scarring from previous hernia repair.  I felt that laparotomy was warranted to further evaluate the viability of bowel.  A lower midline incision was then used.  Dissection was carried at the midline and the midline muscles were opened below the umbilicus.  I entered the abdominal cavity without difficulty.  He had significant  dilation of the small bowel into his pelvis.  He had dense lesions to the undersurface of the abdominal wall from previous AAA repair that were taken down sharply under direct vision.  There is no evidence of bowel injury and doing this.  His obstruction seemed to terminate in the pelvis.  Upon examination of the peritoneum the femoral hernia was noted.  There is no bowel though in this.  I went ahead and ran the distal small bowel and identified a segment of distal ileum at  an area of ischemia involving the antimesenteric border that look chronic.  This is more than likely produced earlier but given the appearance of the antimesenteric wall I did not feel this would be viable long-term and could perforate.  This was resected using a GIA-75 stapling device and a TA 60 to close the common enterotomy creating a side-to-side functional end-to-end anastomosis.  There is no tension anastomosis.  A crotch stitch was placed in the anastomosis.  Any bleeding from the suture line was oversewed with 3-0 Vicryl.  The remainder of the small bowel was run to the terminal ileum and found to be normal.  He had dense proximal adhesions.  I reexamined the small bowel where the lysis of adhesions was performed and there is no evidence of any compromise or injury to the bowel.  There is some sites of oozing oversewed with 3-0 Vicryl.  This was irrigated.  We then closed fascia with #1 PDS.  Staples were used to close the skin.  The left groin was re-addressed.  Cord structures were encircled with 1/4 inch Penrose.  I there is some mesh resected in order to open the floor then around the inguinal canal earlier.  I repaired the Cooper's ligament using interrupted #1 Novafil.  His tissues were extremely frail and were not holding sutures well.  I opted to use a large ultra pleural hernia system to place the inner leaflet in the preperitoneal space to help reinforce the femoral canal.  This was secured to the shelving edge of the inguinal ligament with interrupted #1 Novafil.  The medial portion was secured to the internal oblique with #1 Novafil.  The cord structures were densely scarred.  I could not identify the ilioinguinal nerve within the scar.  Care was taken not to place sutures were this could be.  Once the mesh was in place we secured the external oblique with 2-0 Vicryl.  There is ample room for the cord structures to exit.  An additional stitch was used to help close down the femoral canal with  care avoiding the femoral nerve and femoral vessels.  The repair appeared intact and closed the defect nicely.  I then closed the subcutaneous scar with 3-0 Vicryl and staples were used for the skin.  All final counts were found to be correct.  The cord structures were preserved and testicle was placed back into the hemiscrotum.  All final counts were correct.  The patient was awoke extubated taken to recovery in satisfactory condition.

## 2017-04-08 NOTE — Anesthesia Procedure Notes (Signed)
Procedure Name: Intubation Date/Time: 04/08/2017 9:18 AM Performed by: Candis Shine, CRNA Pre-anesthesia Checklist: Patient identified, Emergency Drugs available, Suction available and Patient being monitored Patient Re-evaluated:Patient Re-evaluated prior to induction Oxygen Delivery Method: Circle System Utilized Preoxygenation: Pre-oxygenation with 100% oxygen Induction Type: IV induction Ventilation: Two handed mask ventilation required Laryngoscope Size: Mac and 3 Grade View: Grade I Tube type: Oral Tube size: 7.5 mm Number of attempts: 1 Airway Equipment and Method: Stylet Placement Confirmation: ETT inserted through vocal cords under direct vision,  positive ETCO2 and breath sounds checked- equal and bilateral Secured at: 22 cm Tube secured with: Tape Dental Injury: Teeth and Oropharynx as per pre-operative assessment  Comments: 2 handed mask d/t full beard/edentulous

## 2017-04-08 NOTE — Transfer of Care (Signed)
Immediate Anesthesia Transfer of Care Note  Patient: Tommy Turner  Procedure(s) Performed: HERNIA REPAIR FEMORAL (Left ) EXPLORATORY LAPAROTOMY (Abdomen) INSERTION OF MESH SMALL BOWEL RESECTION (Abdomen)  Patient Location: PACU  Anesthesia Type:General  Level of Consciousness: awake, alert  and oriented  Airway & Oxygen Therapy: Patient Spontanous Breathing and Patient connected to face mask oxygen  Post-op Assessment: Report given to RN and Post -op Vital signs reviewed and stable  Post vital signs: Reviewed and stable  Last Vitals:  Vitals:   04/08/17 0855 04/08/17 0900  BP: (!) 116/47 (!) 114/50  Pulse: 83 81  Resp: (!) 30 20  Temp:    SpO2: 91% 91%    Last Pain:  Vitals:   04/08/17 0454  TempSrc: Oral  PainSc:       Patients Stated Pain Goal: 2 (94/70/96 2836)  Complications: No apparent anesthesia complications

## 2017-04-08 NOTE — Progress Notes (Signed)
Called by RN for patient recently returned back to floor from procedure and patient is noted to be lethargic, confused and dropping oxygen sats.  On my arrival to patients room, RN, RT and family present.  PAtient in bed on NRB sats 100%.  RT weaning oxygen to venti mask, then lowered to nasal cannula 3LPM nasal cannula and maintaining sats.  Patient is lethargic but arouses easily, is pleasantly confused.  Patient states he does feel like he is having some slight trouble breathing, but does not appear to be labored.  RN has paged MD.  NO RRT interventions.  RN to call if assistance needed

## 2017-04-08 NOTE — Anesthesia Postprocedure Evaluation (Signed)
Anesthesia Post Note  Patient: Tommy Turner  Procedure(s) Performed: HERNIA REPAIR FEMORAL (Left ) EXPLORATORY LAPAROTOMY (Abdomen) INSERTION OF MESH SMALL BOWEL RESECTION (Abdomen)     Patient location during evaluation: PACU Anesthesia Type: General Level of consciousness: awake and alert Pain management: pain level controlled Vital Signs Assessment: post-procedure vital signs reviewed and stable Respiratory status: spontaneous breathing, nonlabored ventilation, respiratory function stable and patient connected to nasal cannula oxygen Cardiovascular status: blood pressure returned to baseline and stable Postop Assessment: no apparent nausea or vomiting Anesthetic complications: no    Last Vitals:  Vitals:   04/08/17 0855 04/08/17 0900  BP: (!) 116/47 (!) 114/50  Pulse: 83 81  Resp: (!) 30 20  Temp:    SpO2: 91% 91%    Last Pain:  Vitals:   04/08/17 0454  TempSrc: Oral  PainSc:                  [name]

## 2017-04-09 ENCOUNTER — Inpatient Hospital Stay (HOSPITAL_COMMUNITY): Payer: 59

## 2017-04-09 ENCOUNTER — Other Ambulatory Visit: Payer: Self-pay

## 2017-04-09 ENCOUNTER — Encounter (HOSPITAL_COMMUNITY): Payer: Self-pay | Admitting: Surgery

## 2017-04-09 LAB — BASIC METABOLIC PANEL
ANION GAP: 6 (ref 5–15)
BUN: 9 mg/dL (ref 6–20)
CALCIUM: 8.9 mg/dL (ref 8.9–10.3)
CO2: 35 mmol/L — AB (ref 22–32)
CREATININE: 0.51 mg/dL — AB (ref 0.61–1.24)
Chloride: 101 mmol/L (ref 101–111)
Glucose, Bld: 95 mg/dL (ref 65–99)
Potassium: 4.2 mmol/L (ref 3.5–5.1)
SODIUM: 142 mmol/L (ref 135–145)

## 2017-04-09 LAB — CBC
HEMATOCRIT: 37.9 % — AB (ref 39.0–52.0)
Hemoglobin: 11.8 g/dL — ABNORMAL LOW (ref 13.0–17.0)
MCH: 30.1 pg (ref 26.0–34.0)
MCHC: 31.1 g/dL (ref 30.0–36.0)
MCV: 96.7 fL (ref 78.0–100.0)
Platelets: 122 10*3/uL — ABNORMAL LOW (ref 150–400)
RBC: 3.92 MIL/uL — ABNORMAL LOW (ref 4.22–5.81)
RDW: 13.4 % (ref 11.5–15.5)
WBC: 13.3 10*3/uL — AB (ref 4.0–10.5)

## 2017-04-09 LAB — GLUCOSE, CAPILLARY: GLUCOSE-CAPILLARY: 126 mg/dL — AB (ref 65–99)

## 2017-04-09 MED ORDER — FUROSEMIDE 10 MG/ML IJ SOLN
40.0000 mg | Freq: Once | INTRAMUSCULAR | Status: AC
  Administered 2017-04-09: 40 mg via INTRAVENOUS
  Filled 2017-04-09: qty 4

## 2017-04-09 MED ORDER — OXYCODONE HCL 5 MG PO TABS: 5.0000 mg | ORAL_TABLET | ORAL | Status: DC | PRN

## 2017-04-09 MED ORDER — IBUPROFEN 200 MG PO TABS
400.0000 mg | ORAL_TABLET | Freq: Three times a day (TID) | ORAL | Status: DC
  Administered 2017-04-09 – 2017-04-11 (×6): 400 mg via ORAL
  Filled 2017-04-09 (×6): qty 2

## 2017-04-09 MED ORDER — METOPROLOL SUCCINATE ER 25 MG PO TB24: 12.5000 mg | ORAL_TABLET | Freq: Every day | ORAL | Status: DC

## 2017-04-09 MED ORDER — ACETAMINOPHEN 325 MG PO TABS
650.0000 mg | ORAL_TABLET | Freq: Four times a day (QID) | ORAL | Status: DC
  Administered 2017-04-09 – 2017-04-11 (×9): 650 mg via ORAL
  Filled 2017-04-09 (×9): qty 2

## 2017-04-09 NOTE — Progress Notes (Signed)
[facility] Progress Note  1 Day Post-Op  Subjective: CC:  Reports abdominal soreness over incision. Reports SOB is improved compared to yesterday - sats are 90% on venti mask. Pt reports COPD on chronic O2 - wears 2-3 L [place] at home. Denies fever, chills, nausea, or vomiting. Unsure If having flatus. Denies BM. Pt mobilizes at home using a cane/walker.   Objective: Vital signs in last 24 hours: Temp:  [97.9 F (36.6 C)-98.6 F (37 C)] 98.1 F (36.7 C) (12/04 0938) Pulse Rate:  [74-95] 79 (12/04 0608) Resp:  [16-30] 16 (12/04 0608) BP: (102-131)/(47-69) 112/54 (12/04 0608) SpO2:  [82 %-100 %] 96 % (12/04 1829) Weight:  [57.6 kg (126 lb 15.8 oz)] 57.6 kg (126 lb 15.8 oz) (12/04 0551) Last BM Date: 04/05/17  Intake/Output from previous day: 12/03 0701 - 12/04 0700 In: 1050 [I.V.:1000] Out: 525 [Urine:500; Blood:25] Intake/Output this shift: No intake/output data recorded.  PE: Gen:  Alert, NAD, pleasant and cooperative  Card:  Regular rate and rhythm Pulm:  Normal effort on venti mask Abd: Soft, appropriately tender, non-distended, bowel sounds present but hypoactive, dressing C/D/I Skin: warm and dry, no rashes  Psych: A&Ox3   Lab Results:  Recent Labs    04/08/17 0507  WBC 11.4*  HGB 12.4*  HCT 40.5  PLT 126*   BMET Recent Labs    04/08/17 0507  NA 141  K 3.7  CL 100*  CO2 37*  GLUCOSE 95  BUN <5*  CREATININE 0.46*  CALCIUM 8.8*   PT/INR No results for input(s): LABPROT, INR in the last 72 hours. CMP     Component Value Date/Time   NA 141 04/08/2017 0507   NA 143 01/15/2017 1134   K 3.7 04/08/2017 0507   CL 100 (L) 04/08/2017 0507   CO2 37 (H) 04/08/2017 0507   GLUCOSE 95 04/08/2017 0507   BUN <5 (L) 04/08/2017 0507   BUN 8 01/15/2017 1134   CREATININE 0.46 (L) 04/08/2017 0507   CREATININE 0.51 08/12/2014 1602   CALCIUM 8.8 (L) 04/08/2017 0507   PROT 6.1 (L) 04/05/2017 2342   PROT 6.8 01/15/2017 1134   ALBUMIN 3.6 04/05/2017 2342    ALBUMIN 4.3 01/15/2017 1134   AST 14 (L) 04/05/2017 2342   ALT 7 (L) 04/05/2017 2342   ALKPHOS 68 04/05/2017 2342   BILITOT 0.6 04/05/2017 2342   BILITOT 0.5 01/15/2017 1134   GFRNONAA >60 04/08/2017 0507   GFRAA >60 04/08/2017 0507   Lipase     Component Value Date/Time   LIPASE 24 04/05/2017 2342   Studies/Results: Dg Chest Port 1 View  Result Date: 04/08/2017 CLINICAL DATA:  NG tube placement EXAM: PORTABLE CHEST 1 VIEW COMPARISON:  04/06/2017 FINDINGS: 1659 hours. NG tube projects over the mediastinum. The tip is over the mid mediastinum and projects just below the carina, suggesting esophageal placement. Left base collapse/consolidation again noted. Pleural effusion evident. The cardio pericardial silhouette is enlarged. Interstitial markings are diffusely coarsened with chronic features. IMPRESSION: 1. NG tube tip appears to be just below the level of the carina suggesting placement in the midesophagus. As the carina is not well demonstrated, close correlation recommended to exclude airway placement. 2. Cardiomegaly with retrocardiac left base collapse/consolidation and small effusion. Electronically Signed   By: Misty Stanley M.D.   On: 04/08/2017 17:20   Dg Abd Portable 1v  Result Date: 04/08/2017 CLINICAL DATA:  NG tube placement. EXAM: PORTABLE ABDOMEN - 1 VIEW COMPARISON:  None. FINDINGS: AP semi-upright view  of the abdomen at 1651 hours shows the tip of an NG tube over the mid mediastinum. This appears to project just below the carina suggesting esophageal placement. Left base collapse/ consolidation evident. Gaseous colon distention noted in this patient status post aortic endograft placement. IMPRESSION: NG tube tip projects over the mid mediastinum and appears to be just below the level of the carina suggesting esophageal placement. Nevertheless, correlation for airway placement recommended. Electronically Signed   By: Misty Stanley M.D.   On: 04/08/2017 17:18     Anti-infectives: Anti-infectives (From admission, onward)   Start     Dose/Rate Route Frequency Ordered Stop   04/08/17 0915  ceFAZolin (ANCEF) IVPB 2g/100 mL premix     2 g 200 mL/hr over 30 Minutes Intravenous  Once 04/08/17 0906 04/08/17 0955   04/08/17 0904  ceFAZolin (ANCEF) 2-4 GM/100ML-% IVPB    Comments:  [name]   : cabinet override      04/08/17 5465 04/08/17 0925     Assessment/Plan COPD on home O2 CHF - cardiology following  ICM PMH AAA repair Tobacco abuse  Anxiety   Incarcerated left femoral hernia  POD#1 S/P Exploratory laparotomy, small bowel resection, femoral hernia repair with mesh - afebrile, VSS, CBC and BMET pending  - continue NPO except sips/ice chips this morning, plan to advance to clears later today vs tomorrow as patient weans O2 and bowel function returns.  - PO pain control - scheduled tylenol and ibuprofen, PRN oxycodone, IV for breakthrough. Minimize narcotics as able.  - mobilize/OOB as able; PT/OT evals  - incentive spirometry  FEN: NPO except sips with meds, IVF ID: perioperative ancef VTE: SCD's, lovenox  Foley: external cath - d/c today if able.     LOS: 3 days    [facility] 04/09/2017, 8:29 AM Pager: [phone] Consults: [phone] Mon-Fri 7:00 am-4:30 pm Sat-Sun 7:00 am-11:30 am

## 2017-04-09 NOTE — Evaluation (Signed)
Physical Therapy Evaluation Patient Details Name: Tommy Turner MRN: [id number] DOB: 09-05-1940 Today's Date: 04/09/2017   History of Present Illness  76 yo admitted with inguinal hernia s/p exp lap with repair. PMHx: COPD home O2 dependent, CAD, CHF, ICM, AAA repair  Clinical Impression  Pt eager to move and to decrease his oxygen requirement. However pt supine in bed with HOB elevated on 10L 45% venturi mask with sats 88% with decrease to 80% with mobility and if pt removes mask sitting for expectorating SpO2 drops to 75%. Pt with decreased strength, mobility, activity tolerance and cardiopulmonary status limiting function and gait who will benefit from acute therapy to maximize mobility, activity tolerance and gait to return to PLOF. Pt educated for breathing technique, progressive mobility, OOB for all meals and need for maintaining mask at this time.      Follow Up Recommendations Home health PT;Supervision/Assistance - 24 hour    Equipment Recommendations  None recommended by PT    Recommendations for Other Services       Precautions / Restrictions Precautions Precautions: Fall Precaution Comments: watch SpO2 Restrictions Weight Bearing Restrictions: No      Mobility  Bed Mobility Overal bed mobility: Modified Independent             General bed mobility comments: with rail, HOB 30 degrees and increased time  Transfers Overall transfer level: Needs assistance   Transfers: Sit to/from Stand;Stand Pivot Transfers Sit to Stand: Min guard Stand pivot transfers: Min guard       General transfer comment: cues for hand placement, safety and breathing technique  Ambulation/Gait             General Gait Details: unable due to pulmonary status  Stairs            Wheelchair Mobility    Modified Rankin (Stroke Patients Only)       Balance Overall balance assessment: Needs assistance   Sitting balance-Leahy Scale: Good       Standing  balance-Leahy Scale: Fair                               Pertinent Vitals/Pain Pain Assessment: 0-10 Pain Score: 4  Pain Location: right groin Pain Descriptors / Indicators: Sore Pain Intervention(s): Limited activity within patient's tolerance;Repositioned    Home Living Family/patient expects to be discharged to:: Private residence Living Arrangements: Spouse/significant other Available Help at Discharge: Family;Available 24 hours/day Type of Home: Mobile home Home Access: Stairs to enter Entrance Stairs-Rails: Can reach both;Left;Right Entrance Stairs-Number of Steps: 2 Home Layout: One level Home Equipment: Walker - 2 wheels;Shower seat      Prior Function Level of Independence: Needs assistance   Gait / Transfers Assistance Needed: cane or RW for gait  ADL's / Homemaking Assistance Needed: pt sponge bathes, able to dress without assist and helps with homemaking        Hand Dominance        Extremity/Trunk Assessment   Upper Extremity Assessment Upper Extremity Assessment: Generalized weakness    Lower Extremity Assessment Lower Extremity Assessment: Generalized weakness    Cervical / Trunk Assessment Cervical / Trunk Assessment: Kyphotic  Communication   Communication: HOH  Cognition Arousal/Alertness: Awake/alert Behavior During Therapy: WFL for tasks assessed/performed Overall Cognitive Status: Within Functional Limits for tasks assessed  General Comments      Exercises     Assessment/Plan    PT Assessment Patient needs continued PT services  PT Problem List Decreased strength;Decreased mobility;Decreased safety awareness;Decreased activity tolerance;Cardiopulmonary status limiting activity;Decreased balance;Decreased knowledge of use of DME       PT Treatment Interventions Gait training;Therapeutic exercise;Patient/family education;Stair training;Functional mobility  training;DME instruction;Therapeutic activities    PT Goals (Current goals can be found in the Care Plan section)  Acute Rehab PT Goals Patient Stated Goal: return home PT Goal Formulation: With patient/family Time For Goal Achievement: 04/23/17 Potential to Achieve Goals: Good    Frequency Min 3X/week   Barriers to discharge        Co-evaluation               AM-PAC PT "6 Clicks" Daily Activity  Outcome Measure Difficulty turning over in bed (including adjusting bedclothes, sheets and blankets)?: A Little Difficulty moving from lying on back to sitting on the side of the bed? : A Little Difficulty sitting down on and standing up from a chair with arms (e.g., wheelchair, bedside commode, etc,.)?: A Little Help needed moving to and from a bed to chair (including a wheelchair)?: A Little Help needed walking in hospital room?: A Little Help needed climbing 3-5 steps with a railing? : A Little 6 Click Score: 18    End of Session   Activity Tolerance: Treatment limited secondary to medical complications (Comment) Patient left: in chair;with call bell/phone within reach;with family/visitor present Nurse Communication: Mobility status PT Visit Diagnosis: Other abnormalities of gait and mobility (R26.89);Muscle weakness (generalized) (M62.81)    Time: 0814-4818 PT Time Calculation (min) (ACUTE ONLY): 18 min   Charges:   PT Evaluation $PT Eval Moderate Complexity: 1 Mod     PT G Codes:        Elwyn Reach, PT [phone]   [place] 04/09/2017, 1:59 PM

## 2017-04-09 NOTE — Progress Notes (Addendum)
PROGRESS NOTE  Tommy Turner KYH:[id number] DOB: 23-Mar-1941 DOA: 04/05/2017 PCP: [name], MD  HPI/Recap of past 24 hours: 76 y.o.  AAA status post repair 1/51/76,  Chronic systolic CHF with EF 16% (2017),  CAD- ischemic CM with CABG ~ 2000-non-isch NUC 2015 Prior PVC's Partial gastrectomy 1990 for PUD L ing repair 2005 O2 dependant COPD + chronic respiratory failure on 3 liters Bipolar TURB 11/2015  Chronic pain,  now presenting to the emergency department for evaluation of left groin pain 04/05/17. Onset 2 days ago. Had a left inguinal hernia repair approximately 10 years ago. Left inguinal Hernia was incarcerated then reduced at the bedside by general surgery at [facility].  CT of the abdomen and pelvis 04/06/17 demonstrates dilated loops of small bowel in the left lower quadrant with transition point related to the small left inguinal hernia.  NG tube was placed and the patient was treated with IV fluids, fentanyl, and Zofran. General surgery following.   Pt seen and examined with his wife at his bedside. POD#0 post left hernia repair. Rapid response called due to acute respiratory distress post surgery.  Pain is currently well controlled. Alert and oriented x3.   Assessment/Plan: Principal Problem:   Incarcerated inguinal hernia, unilateral Active Problems:   Essential hypertension   COPD with hypoxia (HCC)   CAD (coronary artery disease)   Anxiety   Chronic pain syndrome   Chronic respiratory failure with hypoxia and hypercapnia (HCC)   Chronic systolic CHF (congestive heart failure) (HCC)   Thrombocytopenia (HCC)   Incarcerated inguinal hernia   post op 04/08/17 repair left inguinal hernia incarceration -Reduced by general surgery 04/07/17 -general surgery following. -We appreciate gen surgery recommendations--->pain management in place -norco 10-325 prn for severe pain  Acute on chronic hypoxic hypercarbic respiratory failure -ABG 04/08/17 PCO2  63 PO2 64 -on venti mask and sat 98% -wean oxygen and monitor  Chronic systolic heart failure with EF 20% (04/2016) CABG 2000 -appears stable, in no acute exacerbation -asa 81 daily , metoprolol xl 12.5 on hold, torsemide 20 od on hold--resuming metoprolol when bp can support -no statin in home med list  Ischemic cardiomyopathy -management as stated above  S/p endovascular repair AAA 2016 -no acute issues  COPD, no acute exacerbation -stable -proventin, spiriva  Tobacco use disorder -nicotine patch -tobacco cessation counseling  Anxiety -stable -klonopin  Code Status: DNR  Family Communication: Wife at bedside. All questions answered to her satisfaction.  Disposition Plan: await resolution as per gen surgery  Consultants:  General surgery  Procedures:  Left inguinal hernia manual reduction  Surgery 04/08/17  Antimicrobials:  None indicated  DVT prophylaxis:  lovenox 40 mg sq daily   Objective: Vitals:   04/09/17 0137 04/09/17 0551 04/09/17 0608 04/09/17 1350  BP: 115/60  (!) 112/54 99/60  Pulse: 95  79 84  Resp: 18  16 16   Temp: 98.4 F (36.9 C)  98.1 F (36.7 C) (!) 97.5 F (36.4 C)  TempSrc: Oral  Oral Axillary  SpO2: 95%  96% (!) 88%  Weight:  57.6 kg (126 lb 15.8 oz)    Height:        Intake/Output Summary (Last 24 hours) at 04/09/2017 1525 Last data filed at 04/09/2017 0835 Gross per 24 hour  Intake -  Output 650 ml  Net -650 ml   Filed Weights   04/05/17 2120 04/06/17 0614 04/09/17 0551  Weight: 57.6 kg (127 lb) 59.8 kg (131 lb 13.4 oz) 57.6 kg (126  lb 15.8 oz)    Exam:   General:  76 yo CM thin built laying in bed in NAD. A&O x 3  Cardiovascular: RRR no rubs or gallops  Respiratory: CTA no wheezes or rales  Abdomen: soft NT ND NBSx 4 quadrants  Musculoskeletal: left inguinal region is covered with surgical dressing. Appears clean.  Skin: No noted rashes  Psychiatry: Mood is appropriate for condition and  setting   Data Reviewed: CBC: Recent Labs  Lab 04/05/17 2342 04/06/17 0712 04/08/17 0507 04/09/17 1001  WBC 8.0 11.7* 11.4* 13.3*  NEUTROABS 6.0 9.7*  --   --   HGB 11.9* 11.8* 12.4* 11.8*  HCT 39.1 38.6* 40.5 37.9*  MCV 98.5 97.7 96.2 96.7  PLT 140* 125* 126* 170*   Basic Metabolic Panel: Recent Labs  Lab 04/05/17 2342 04/08/17 0507 04/09/17 1001  NA 142 141 142  K 4.0 3.7 4.2  CL 100* 100* 101  CO2 38* 37* 35*  GLUCOSE 102* 95 95  BUN 16 <5* 9  CREATININE 0.37* 0.46* 0.51*  CALCIUM 9.1 8.8* 8.9   GFR: Estimated Creatinine Clearance: 63 mL/min (A) (by C-G formula based on SCr of 0.51 mg/dL (L)). Liver Function Tests: Recent Labs  Lab 04/05/17 2342  AST 14*  ALT 7*  ALKPHOS 68  BILITOT 0.6  PROT 6.1*  ALBUMIN 3.6   Recent Labs  Lab 04/05/17 2342  LIPASE 24   No results for input(s): AMMONIA in the last 168 hours. Coagulation Profile: No results for input(s): INR, PROTIME in the last 168 hours. Cardiac Enzymes: No results for input(s): CKTOTAL, CKMB, CKMBINDEX, TROPONINI in the last 168 hours. BNP (last 3 results) No results for input(s): PROBNP in the last 8760 hours. HbA1C: No results for input(s): HGBA1C in the last 72 hours. CBG: Recent Labs  Lab 04/06/17 0610 04/07/17 0615 04/07/17 0814 04/08/17 1639 04/09/17 0611  GLUCAP 87 77 72 143* 126*   Lipid Profile: No results for input(s): CHOL, HDL, LDLCALC, TRIG, CHOLHDL, LDLDIRECT in the last 72 hours. Thyroid Function Tests: No results for input(s): TSH, T4TOTAL, FREET4, T3FREE, THYROIDAB in the last 72 hours. Anemia Panel: No results for input(s): VITAMINB12, FOLATE, FERRITIN, TIBC, IRON, RETICCTPCT in the last 72 hours. Urine analysis:    Component Value Date/Time   COLORURINE YELLOW 04/06/2017 0025   APPEARANCEUR CLEAR 04/06/2017 0025   LABSPEC 1.023 04/06/2017 0025   PHURINE 6.0 04/06/2017 0025   GLUCOSEU NEGATIVE 04/06/2017 0025   HGBUR NEGATIVE 04/06/2017 0025   BILIRUBINUR  NEGATIVE 04/06/2017 0025   KETONESUR NEGATIVE 04/06/2017 0025   PROTEINUR NEGATIVE 04/06/2017 0025   UROBILINOGEN 1.0 09/10/2014 0919   NITRITE NEGATIVE 04/06/2017 0025   LEUKOCYTESUR NEGATIVE 04/06/2017 0025   Sepsis Labs: @LABRCNTIP (procalcitonin:4,lacticidven:4)  ) Recent Results (from the past 240 hour(s))  Urine Culture     Status: None   Collection Time: 04/06/17 12:25 AM  Result Value Ref Range Status   Specimen Description URINE, CLEAN CATCH  Final   Special Requests NONE  Final   Culture   Final    NO GROWTH Performed at [facility], [place] [address] [place], [place] [address]    Report Status 04/07/2017 FINAL  Final      Studies: Dg Chest Port 1 View  Result Date: 04/08/2017 CLINICAL DATA:  NG tube placement EXAM: PORTABLE CHEST 1 VIEW COMPARISON:  04/06/2017 FINDINGS: 1659 hours. NG tube projects over the mediastinum. The tip is over the mid mediastinum and projects just below the carina, suggesting  esophageal placement. Left base collapse/consolidation again noted. Pleural effusion evident. The cardio pericardial silhouette is enlarged. Interstitial markings are diffusely coarsened with chronic features. IMPRESSION: 1. NG tube tip appears to be just below the level of the carina suggesting placement in the midesophagus. As the carina is not well demonstrated, close correlation recommended to exclude airway placement. 2. Cardiomegaly with retrocardiac left base collapse/consolidation and small effusion. Electronically Signed   By: Misty Stanley M.D.   On: 04/08/2017 17:20   Dg Abd Portable 1v  Result Date: 04/08/2017 CLINICAL DATA:  NG tube placement. EXAM: PORTABLE ABDOMEN - 1 VIEW COMPARISON:  None. FINDINGS: AP semi-upright view of the abdomen at 1651 hours shows the tip of an NG tube over the mid mediastinum. This appears to project just below the carina suggesting esophageal placement. Left base collapse/ consolidation evident. Gaseous colon distention noted in  this patient status post aortic endograft placement. IMPRESSION: NG tube tip projects over the mid mediastinum and appears to be just below the level of the carina suggesting esophageal placement. Nevertheless, correlation for airway placement recommended. Electronically Signed   By: Misty Stanley M.D.   On: 04/08/2017 17:18    Scheduled Meds: . acetaminophen  650 mg Oral Q6H  . enoxaparin (LOVENOX) injection  40 mg Subcutaneous Q24H  . ibuprofen  400 mg Oral Q8H  . tiotropium  18 mcg Inhalation Daily    Continuous Infusions: . lactated ringers 10 mL/hr at 04/08/17 0835     LOS: 3 days     Nita Sells, MD [facility] Pager [phone]  If 7PM-7AM, please contact night-coverage [email or web address] Password [email or web address] 04/09/2017, 3:25 PM

## 2017-04-10 LAB — GLUCOSE, CAPILLARY: Glucose-Capillary: 114 mg/dL — ABNORMAL HIGH (ref 65–99)

## 2017-04-10 MED ORDER — ENSURE ENLIVE PO LIQD: 237.0000 mL | Freq: Two times a day (BID) | ORAL | Status: DC

## 2017-04-10 MED ORDER — POLYETHYLENE GLYCOL 3350 17 G PO PACK
17.0000 g | PACK | Freq: Every day | ORAL | Status: DC
  Administered 2017-04-10 – 2017-04-11 (×2): 17 g via ORAL
  Filled 2017-04-10 (×2): qty 1

## 2017-04-10 MED ORDER — TORSEMIDE 20 MG PO TABS
20.0000 mg | ORAL_TABLET | ORAL | Status: DC
Start: 2017-04-10 — End: 2017-04-11

## 2017-04-10 NOTE — Progress Notes (Signed)
PROGRESS NOTE  Tommy Turner DJT:[id number] DOB: 1941/04/03 DOA: 04/05/2017 PCP: [name], MD  HPI/Recap of past 24 hours:  76 y.o.  AAA status post repair 3/00/92,  Chronic systolic CHF with EF 33% (2017),  CAD- ischemic CM with CABG ~ 2000-non-isch NUC 2015 Prior PVC's Partial gastrectomy 1990 for PUD L ing repair 2005 O2 dependant COPD + chronic respiratory failure on 3 liters Bipolar TURB 11/2015  Chronic pain,  now presenting to the emergency department for evaluation of left groin pain 04/05/17. Onset 2 days ago. Had a left inguinal hernia repair approximately 10 years ago. Left inguinal Hernia was incarcerated then reduced at the bedside by general surgery at [facility].  CT of the abdomen and pelvis 04/06/17 demonstrates dilated loops of small bowel in the left lower quadrant with transition point related to the small left inguinal hernia.  NG tube was placed and the patient was treated with IV fluids, fentanyl, and Zofran. General surgery following.   Rapid response called 12/3.   SUBJ  Overall is doing better and seems to tolerate clear liquids fairly well Coughing some according to nursing using incentive spirometer No chest pain No chills     Assessment/Plan: Principal Problem:   Incarcerated inguinal hernia, unilateral Active Problems:   Essential hypertension   COPD with hypoxia ([place])   CAD (coronary artery disease)   Anxiety   Chronic pain syndrome   Chronic respiratory failure with hypoxia and hypercapnia (HCC)   Chronic systolic CHF (congestive heart failure) (HCC)   Thrombocytopenia ([place])   Incarcerated inguinal hernia   post op 04/08/17 repair left inguinal hernia incarceration -Reduced by general surgery 04/07/17 -general surgery following. -We appreciate gen surgery recommendations--->pain management in place -norco 10-325 prn for severe pain and is tolerating the same fairly well  Acute on chronic hypoxic hypercarbic  respiratory failure -ABG 04/08/17 PCO2 63 PO2 64 -on venti mask and sat 98% -Has been weaned to 4 L of nasal oxygen -Suspect patient had some mild decompensation of heart failure   Iatrogenic acute decompensation of chronic systolic heart failure with EF 20% (04/2016) CABG 2000 -appears stable, in no acute exacerbation -asa 81 daily , metoprolol xl 12.5 on hold,-given Lasix times 140 mg on 12/4 with good effect and now saturating better -We will resume torsemide at home dose of 20 mg on 12/5 -no statin in home med list  Ischemic cardiomyopathy -management as stated above  S/p endovascular repair AAA 2016 -no acute issues  COPD, no acute exacerbation -stable -proventin, spiriva  Tobacco use disorder -nicotine patch -tobacco cessation counseling  Anxiety -stable -klonopin  Code Status: DNR  Family Communication: Wife at bedside.   Disposition Plan: await resolution as per gen surgery-once patient is able to tolerate p.o. so she will probably be able to discharge him home with close follow-up  Consultants:  General surgery  Procedures:  Left inguinal hernia manual reduction  Surgery 04/08/17  Antimicrobials:  None indicated  DVT prophylaxis:  lovenox 40 mg sq daily   Objective: Vitals:   04/09/17 2112 04/10/17 0449 04/10/17 0807 04/10/17 1006  BP: 99/65 98/60 99/62    Pulse: 82 80 82   Resp: 18 18 18    Temp: 98 F (36.7 C) (!) 97.1 F (36.2 C) 97.6 F (36.4 C)   TempSrc: Oral Oral Oral   SpO2: 95% 90% 90% 97%  Weight:      Height:        Intake/Output Summary (Last 24 hours) at 04/10/2017  [place] filed at 04/10/2017 0700 Gross per 24 hour  Intake 240 ml  Output 775 ml  Net -535 ml   Filed Weights   04/05/17 2120 04/06/17 0614 04/09/17 0551  Weight: 57.6 kg (127 lb) 59.8 kg (131 lb 13.4 oz) 57.6 kg (126 lb 15.8 oz)    Exam:   Awake alert in no distress  EOMI NCAT  Chest is clinically clear no added soun   abdomen soft  nontender nondistended no rebound--dressing is in place no lower extremity edema nor swelling at this time    Data Reviewed: CBC: Recent Labs  Lab 04/05/17 2342 04/06/17 0712 04/08/17 0507 04/09/17 1001  WBC 8.0 11.7* 11.4* 13.3*  NEUTROABS 6.0 9.7*  --   --   HGB 11.9* 11.8* 12.4* 11.8*  HCT 39.1 38.6* 40.5 37.9*  MCV 98.5 97.7 96.2 96.7  PLT 140* 125* 126* 154*   Basic Metabolic Panel: Recent Labs  Lab 04/05/17 2342 04/08/17 0507 04/09/17 1001  NA 142 141 142  K 4.0 3.7 4.2  CL 100* 100* 101  CO2 38* 37* 35*  GLUCOSE 102* 95 95  BUN 16 <5* 9  CREATININE 0.37* 0.46* 0.51*  CALCIUM 9.1 8.8* 8.9   GFR: Estimated Creatinine Clearance: 63 mL/min (A) (by C-G formula based on SCr of 0.51 mg/dL (L)). Liver Function Tests: Recent Labs  Lab 04/05/17 2342  AST 14*  ALT 7*  ALKPHOS 68  BILITOT 0.6  PROT 6.1*  ALBUMIN 3.6   Recent Labs  Lab 04/05/17 2342  LIPASE 24   No results for input(s): AMMONIA in the last 168 hours. Coagulation Profile: No results for input(s): INR, PROTIME in the last 168 hours. Cardiac Enzymes: No results for input(s): CKTOTAL, CKMB, CKMBINDEX, TROPONINI in the last 168 hours. BNP (last 3 results) No results for input(s): PROBNP in the last 8760 hours. HbA1C: No results for input(s): HGBA1C in the last 72 hours. CBG: Recent Labs  Lab 04/07/17 0615 04/07/17 0814 04/08/17 1639 04/09/17 0611 04/10/17 0645  GLUCAP 77 72 143* 126* 114*   Lipid Profile: No results for input(s): CHOL, HDL, LDLCALC, TRIG, CHOLHDL, LDLDIRECT in the last 72 hours. Thyroid Function Tests: No results for input(s): TSH, T4TOTAL, FREET4, T3FREE, THYROIDAB in the last 72 hours. Anemia Panel: No results for input(s): VITAMINB12, FOLATE, FERRITIN, TIBC, IRON, RETICCTPCT in the last 72 hours. Urine analysis:    Component Value Date/Time   COLORURINE YELLOW 04/06/2017 0025   APPEARANCEUR CLEAR 04/06/2017 0025   LABSPEC 1.023 04/06/2017 0025   PHURINE 6.0  04/06/2017 0025   GLUCOSEU NEGATIVE 04/06/2017 0025   HGBUR NEGATIVE 04/06/2017 0025   BILIRUBINUR NEGATIVE 04/06/2017 0025   KETONESUR NEGATIVE 04/06/2017 0025   PROTEINUR NEGATIVE 04/06/2017 0025   UROBILINOGEN 1.0 09/10/2014 0919   NITRITE NEGATIVE 04/06/2017 0025   LEUKOCYTESUR NEGATIVE 04/06/2017 0025   Sepsis Labs: @LABRCNTIP (procalcitonin:4,lacticidven:4)  ) Recent Results (from the past 240 hour(s))  Urine Culture     Status: None   Collection Time: 04/06/17 12:25 AM  Result Value Ref Range Status   Specimen Description URINE, CLEAN CATCH  Final   Special Requests NONE  Final   Culture   Final    NO GROWTH Performed at [facility], [place] [address], [place], [place] [address]    Report Status 04/07/2017 FINAL  Final      Studies: Dg Chest Port 1 View  Result Date: 04/09/2017 CLINICAL DATA:  Assess pleural effusion. EXAM: PORTABLE CHEST 1 VIEW COMPARISON:  Chest radiograph April 08, 2017 FINDINGS: Cardiac silhouette is moderately enlarged and unchanged. Status post median sternotomy for CABG. Interval extubation. Fullness of the hila persist. Mild chronic interstitial changes increased lung volumes with worsening retrocardiac consolidation and small bilateral pleural effusions. LEFT apical pleural thickening. No pneumothorax. Osteopenia. IMPRESSION: Similar small bilateral pleural effusions. Worsening retrocardiac consolidation. Stable cardiomegaly. Fullness of the hila seen with prominent vascular shadows or lymphadenopathy. COPD. Interval extubation. Electronically Signed   By: Elon Alas M.D.   On: 04/09/2017 16:04    Scheduled Meds: . acetaminophen  650 mg Oral Q6H  . enoxaparin (LOVENOX) injection  40 mg Subcutaneous Q24H  . ibuprofen  400 mg Oral Q8H  . polyethylene glycol  17 g Oral Daily  . tiotropium  18 mcg Inhalation Daily    Continuous Infusions: . lactated ringers 10 mL/hr at 04/08/17 0835     LOS: 4 days     Nita Sells,  MD [facility] Pager [phone]  If 7PM-7AM, please contact night-coverage [email or web address] Password [email or web address] 04/10/2017, 10:57 AM

## 2017-04-10 NOTE — Evaluation (Signed)
Occupational Therapy Evaluation Patient Details Name: MARQUAL MI MRN: [id number] DOB: 1940/07/06 Today's Date: 04/10/2017    History of Present Illness 76 yo admitted with inguinal hernia s/p exp lap with repair. PMHx: COPD home O2 dependent, CAD, CHF, ICM, AAA repair   Clinical Impression   Pt required encouragement to participate with OT. Pt with decline in function and safety with ADLs and ADL mobility with decreased strength, balance and endurance. Pt would benefit from acute OT services to address impairments to maximize level of function and safety    Follow Up Recommendations  Home health OT;Supervision/Assistance - 24 hour    Equipment Recommendations  Tub/shower bench;Other (comment)(reacher)    Recommendations for Other Services       Precautions / Restrictions Precautions Precautions: Fall Precaution Comments: watch SpO2 Restrictions Weight Bearing Restrictions: No      Mobility Bed Mobility Overal bed mobility: Modified Independent             General bed mobility comments: with rail, HOB 30 degrees and increased time  Transfers Overall transfer level: Needs assistance Equipment used: Rolling walker (2 wheeled) Transfers: Sit to/from [facility] Sit to Stand: Min guard Stand pivot transfers: Min guard       General transfer comment: cues for hand placement, safety and breathing technique    Balance Overall balance assessment: Needs assistance Sitting-balance support: No upper extremity supported Sitting balance-Leahy Scale: Good     Standing balance support: Single extremity supported;Bilateral upper extremity supported;During functional activity Standing balance-Leahy Scale: Fair                             ADL either performed or assessed with clinical judgement   ADL Overall ADL's : Needs assistance/impaired Eating/Feeding: Independent;Sitting   Grooming: Wash/dry hands;Wash/dry face;Standing;Sitting;Set  up   Upper Body Bathing: Set up;Sitting   Lower Body Bathing: Moderate assistance   Upper Body Dressing : Set up;Sitting   Lower Body Dressing: Moderate assistance   Toilet Transfer: Min guard;RW;BSC;Cueing for safety;Cueing for sequencing   Toileting- Clothing Manipulation and Hygiene: Minimal assistance;Sit to/from stand   Tub/ Shower Transfer: Min guard;3 in 1;Ambulation;Rolling walker;Cueing for sequencing;Cueing for safety   Functional mobility during ADLs: Min guard;Rolling walker;Cueing for safety;Cueing for sequencing General ADL Comments: pt required encouragement to participate with OT     Vision Patient Visual Report: No change from baseline       Perception     Praxis      Pertinent Vitals/Pain Pain Assessment: 0-10 Pain Score: 3  Pain Location: right groin Pain Descriptors / Indicators: Sore Pain Intervention(s): Limited activity within patient's tolerance;Monitored during session;Repositioned;Relaxation     Hand Dominance Right   Extremity/Trunk Assessment Upper Extremity Assessment Upper Extremity Assessment: Generalized weakness   Lower Extremity Assessment Lower Extremity Assessment: Defer to PT evaluation   Cervical / Trunk Assessment Cervical / Trunk Assessment: Kyphotic   Communication Communication Communication: HOH   Cognition Arousal/Alertness: Awake/alert Behavior During Therapy: WFL for tasks assessed/performed Overall Cognitive Status: Within Functional Limits for tasks assessed                                     General Comments   pt not eager to get OOB with therapy    Exercises     Shoulder Instructions      Home Living Family/patient expects to be discharged to::  Private residence Living Arrangements: Spouse/significant other Available Help at Discharge: Family;Available 24 hours/day Type of Home: Mobile home Home Access: Stairs to enter Entrance Stairs-Number of Steps: 2 Entrance Stairs-Rails: Can  reach both;Left;Right Home Layout: One level     Bathroom Shower/Tub: Teacher, early years/pre: Standard     Home Equipment: Environmental consultant - 2 wheels;Shower seat          Prior Functioning/Environment Level of Independence: Needs assistance  Gait / Transfers Assistance Needed: cane or RW for gait ADL's / Homemaking Assistance Needed: pt sponge bathes, able to dress without assist and helps with homemaking            OT Problem List: Decreased strength;Decreased activity tolerance;Decreased knowledge of use of DME or AE;Impaired balance (sitting and/or standing);Decreased knowledge of precautions;Pain      OT Treatment/Interventions: Self-care/ADL training;DME and/or AE instruction;Therapeutic activities;Therapeutic exercise;Patient/family education    OT Goals(Current goals can be found in the care plan section) Acute Rehab OT Goals Patient Stated Goal: return home OT Goal Formulation: With patient/family Time For Goal Achievement: 04/17/17 Potential to Achieve Goals: Good ADL Goals Pt Will Perform Grooming: with min guard assist;standing;with caregiver independent in assisting Pt Will Perform Lower Body Bathing: with min assist;with min guard assist;with caregiver independent in assisting Pt Will Perform Lower Body Dressing: with min assist;with min guard assist;with caregiver independent in assisting Pt Will Transfer to Toilet: with supervision;ambulating;regular height toilet Pt Will Perform Toileting - Clothing Manipulation and hygiene: with min guard assist;sit to/from stand;with caregiver independent in assisting  OT Frequency: Min 2X/week   Barriers to D/C:    no barriers       Co-evaluation              AM-PAC PT "6 Clicks" Daily Activity     Outcome Measure Help from another person eating meals?: None Help from another person taking care of personal grooming?: A Little Help from another person toileting, which includes using toliet, bedpan, or  urinal?: A Little Help from another person bathing (including washing, rinsing, drying)?: A Lot Help from another person to put on and taking off regular upper body clothing?: A Little Help from another person to put on and taking off regular lower body clothing?: A Lot 6 Click Score: 17   End of Session Equipment Utilized During Treatment: Gait belt;Rolling walker;Oxygen  Activity Tolerance: Patient tolerated treatment well Patient left: in chair;with call bell/phone within reach;with family/visitor present  OT Visit Diagnosis: Unsteadiness on feet (R26.81);Other abnormalities of gait and mobility (R26.89);Muscle weakness (generalized) (M62.81);Pain Pain - Right/Left: Right Pain - part of body: (groin area)                Time: 1020-1054 OT Time Calculation (min): 34 min Charges:  OT General Charges $OT Visit: 1 Visit OT Evaluation $OT Eval Moderate Complexity: 1 Mod OT Treatments $Therapeutic Activity: 8-22 mins G-Codes: OT G-codes **NOT FOR INPATIENT CLASS** Functional Assessment Tool Used: AM-PAC 6 Clicks Daily Activity     Britt Bottom 04/10/2017, 2:06 PM

## 2017-04-10 NOTE — Care Management Note (Signed)
Case Management Note  Patient Details  Name: TANNAR BROKER MRN: [id number] Date of Birth: Mar 28, 1941  Subjective/Objective:                    Action/Plan: case manager spoke with patient and wife concerning diacharge plan and DME. Choice for Home Health agency was ofered, Mrs. Kroh says they have used [facility] in the past and want to do so now. CM called referral to Humberto Seals, [facility] Liaison. Patient will have family support at discharge.    Expected Discharge Date:     pending             Expected Discharge Plan:   Home with Home Health  In-House Referral:  NA  Discharge planning Services  CM Consult  Post Acute Care Choice:  Home Health, Durable Medical Equipment Choice offered to:  Spouse, Patient  DME Arranged:  Tub bench DME Agency:  [place] Arranged:  PT, OT [facility] Agency:  [place]  Status of Service:  Completed, signed off  If discussed at [place] of Stay Meetings, dates discussed:    Additional Comments:  Ninfa Meeker, RN 04/10/2017, 3:07 PM

## 2017-04-10 NOTE — Progress Notes (Signed)
Initial Nutrition Assessment  DOCUMENTATION CODES:   Severe malnutrition in context of chronic illness  INTERVENTION:    Ensure Enlive po BID, each supplement provides 350 kcal and 20 grams of protein  NUTRITION DIAGNOSIS:   Severe Malnutrition related to chronic illness as evidenced by severe fat depletion, severe muscle depletion  GOAL:   Patient will meet greater than or equal to 90% of their needs  MONITOR:   PO intake, Supplement acceptance, Labs, Weight trends, Skin, I & O's  REASON FOR ASSESSMENT:   Malnutrition Screening Tool  ASSESSMENT:   76 y.o. Male with PMH significant for AAA status post repair, CHF with EF 20% (2017), CAD, ischemic CM, COPD, chronic respiratory failure, anxiety disorder, and chronic pain; presented to ED for evaluation of left groin pain; found to have an acute left inguinal hernia incarceration.   Pt s/p procedures 12/3: HERNIA REPAIR FEMORAL (Left) EXPLORATORY LAPAROTOMY INSERTION OF MESH SMALL BOWEL RESECTION  RD spoke with pt and pt's wife. Wife reports he was consuming clear liquids well prior to diet advancement (Full Liquids). Typically consumes 3 meals per day at home. Wife stated "he eats a good supper".  Pt amenable to trying Ensure Enlive supplements during hospitalization. Believes he's lost weight, however, unable to identify amount or time frame. Labs and medications reviewed. CBG's [id number].  NUTRITION - FOCUSED PHYSICAL EXAM:    Most Recent Value  Orbital Region  Unable to assess  Upper Arm Region  Severe depletion  Thoracic and Lumbar Region  Unable to assess  Buccal Region  Severe depletion  Temple Region  Severe depletion  Clavicle Bone Region  Severe depletion  Clavicle and Acromion Bone Region  Severe depletion  Scapular Bone Region  Unable to assess  Dorsal Hand  Severe depletion  Patellar Region  Severe depletion  Anterior Thigh Region  Severe depletion  Posterior Calf Region  Severe depletion  Edema  (RD Assessment)  None    Diet Order:  Diet full liquid Room service appropriate? Yes; Fluid consistency: Thin  EDUCATION NEEDS:   No education needs have been identified at this time  Skin:  Skin Assessment: Reviewed RN Assessment  Last BM:  11/30  Height:   Ht Readings from Last 1 Encounters:  04/05/17 5\' 7"  (1.702 m)   Weight:   Wt Readings from Last 1 Encounters:  04/09/17 126 lb 15.8 oz (57.6 kg)   Ideal Body Weight:  67.2 kg  BMI:  Body mass index is 19.89 kg/m.  Estimated Nutritional Needs:   Kcal:  1600-1800  Protein:  75-90 gm  Fluid:  1.6-1.8 L  Arthur Holms, RD, LDN Pager #: [phone] After-Hours Pager #: [phone]

## 2017-04-10 NOTE — Progress Notes (Signed)
2 Days Post-Op   Subjective/Chief Complaint: Passing gas  Breathing better    Objective: Vital signs in last 24 hours: Temp:  [97.1 F (36.2 C)-98 F (36.7 C)] 97.6 F (36.4 C) (12/05 0807) Pulse Rate:  [80-84] 82 (12/05 0807) Resp:  [16-18] 18 (12/05 0807) BP: (98-99)/(60-65) 99/62 (12/05 0807) SpO2:  [88 %-95 %] 90 % (12/05 0807) FiO2 (%):  [45 %] 45 % (12/04 1350) Last BM Date: 04/05/17  Intake/Output from previous day: 12/04 0701 - 12/05 0700 In: 240 [P.O.:240] Out: 925 [Urine:925] Intake/Output this shift: No intake/output data recorded.  Incision/Wound:minimal dressing drainage  Honeycomb in place   Flat soft abdomen   Lab Results:  Recent Labs    04/08/17 0507 04/09/17 1001  WBC 11.4* 13.3*  HGB 12.4* 11.8*  HCT 40.5 37.9*  PLT 126* 122*   BMET Recent Labs    04/08/17 0507 04/09/17 1001  NA 141 142  K 3.7 4.2  CL 100* 101  CO2 37* 35*  GLUCOSE 95 95  BUN <5* 9  CREATININE 0.46* 0.51*  CALCIUM 8.8* 8.9   PT/INR No results for input(s): LABPROT, INR in the last 72 hours. ABG Recent Labs    04/08/17 1645  PHART 7.354  HCO3 34.5*    Studies/Results: Dg Chest Port 1 View  Result Date: 04/09/2017 CLINICAL DATA:  Assess pleural effusion. EXAM: PORTABLE CHEST 1 VIEW COMPARISON:  Chest radiograph April 08, 2017 FINDINGS: Cardiac silhouette is moderately enlarged and unchanged. Status post median sternotomy for CABG. Interval extubation. Fullness of the hila persist. Mild chronic interstitial changes increased lung volumes with worsening retrocardiac consolidation and small bilateral pleural effusions. LEFT apical pleural thickening. No pneumothorax. Osteopenia. IMPRESSION: Similar small bilateral pleural effusions. Worsening retrocardiac consolidation. Stable cardiomegaly. Fullness of the hila seen with prominent vascular shadows or lymphadenopathy. COPD. Interval extubation. Electronically Signed   By: Elon Alas M.D.   On: 04/09/2017 16:04    Dg Chest Port 1 View  Result Date: 04/08/2017 CLINICAL DATA:  NG tube placement EXAM: PORTABLE CHEST 1 VIEW COMPARISON:  04/06/2017 FINDINGS: 1659 hours. NG tube projects over the mediastinum. The tip is over the mid mediastinum and projects just below the carina, suggesting esophageal placement. Left base collapse/consolidation again noted. Pleural effusion evident. The cardio pericardial silhouette is enlarged. Interstitial markings are diffusely coarsened with chronic features. IMPRESSION: 1. NG tube tip appears to be just below the level of the carina suggesting placement in the midesophagus. As the carina is not well demonstrated, close correlation recommended to exclude airway placement. 2. Cardiomegaly with retrocardiac left base collapse/consolidation and small effusion. Electronically Signed   By: Misty Stanley M.D.   On: 04/08/2017 17:20   Dg Abd Portable 1v  Result Date: 04/08/2017 CLINICAL DATA:  NG tube placement. EXAM: PORTABLE ABDOMEN - 1 VIEW COMPARISON:  None. FINDINGS: AP semi-upright view of the abdomen at 1651 hours shows the tip of an NG tube over the mid mediastinum. This appears to project just below the carina suggesting esophageal placement. Left base collapse/ consolidation evident. Gaseous colon distention noted in this patient status post aortic endograft placement. IMPRESSION: NG tube tip projects over the mid mediastinum and appears to be just below the level of the carina suggesting esophageal placement. Nevertheless, correlation for airway placement recommended. Electronically Signed   By: Misty Stanley M.D.   On: 04/08/2017 17:18    Anti-infectives: Anti-infectives (From admission, onward)   Start     Dose/Rate Route Frequency Ordered Stop   04/08/17  0915  ceFAZolin (ANCEF) IVPB 2g/100 mL premix     2 g 200 mL/hr over 30 Minutes Intravenous  Once 04/08/17 0906 04/08/17 0955   04/08/17 0904  ceFAZolin (ANCEF) 2-4 GM/100ML-% IVPB    Comments:  [name]   :  cabinet override      04/08/17 4920 04/08/17 0925      Assessment/Plan: s/p Procedure(s): HERNIA REPAIR FEMORAL (Left) EXPLORATORY LAPAROTOMY INSERTION OF MESH SMALL BOWEL RESECTION Advance diet Ambulate   LOS: 4 days    Tommy Turner 04/10/2017

## 2017-04-11 DIAGNOSIS — J9612 Chronic respiratory failure with hypercapnia: Secondary | ICD-10-CM

## 2017-04-11 DIAGNOSIS — J9611 Chronic respiratory failure with hypoxia: Secondary | ICD-10-CM

## 2017-04-11 DIAGNOSIS — I10 Essential (primary) hypertension: Secondary | ICD-10-CM

## 2017-04-11 MED ORDER — HYDROCODONE-ACETAMINOPHEN 10-325 MG PO TABS: 0.5000 | ORAL_TABLET | Freq: Four times a day (QID) | ORAL | 0 refills | Status: DC | PRN

## 2017-04-11 NOTE — Discharge Summary (Signed)
Physician Discharge Summary  Tommy Turner NAT:[id number] DOB: 1941-05-02 DOA: 04/05/2017  PCP: [name], MD  Admit date: 04/05/2017 Discharge date: 04/11/2017  Admitted From: home Disposition:  Home  Recommendations for Outpatient Follow-up:  1. Follow up with Surgery  in 1-2 weeks 2.   Home Health:Yes Equipment/Devices:none  Discharge Condition:stable CODE STATUS:DNR Diet recommendation: Heart Healthy   Brief/Interim Summary: 76 y.o. male past medical history of AAA repair, chronic systolic heart failure with an EF of 25% coronary artery disease, with partial gastrectomy, COPD oxygen dependent on 3 L at home comes in for incarcerated inguinal hernia    Discharge Diagnoses:  Principal Problem:   Incarcerated inguinal hernia, unilateral Active Problems:   Essential hypertension   COPD with hypoxia (HCC)   CAD (coronary artery disease)   Anxiety   Chronic pain syndrome   Chronic respiratory failure with hypoxia and hypercapnia (HCC)   Chronic systolic CHF (congestive heart failure) (HCC)   Thrombocytopenia (HCC)   Incarcerated inguinal hernia  Incarcerated inguinal hernia, unilateral Status post repair on 04/08/2017, We will continue Norco as an outpatient he tolerated his diet he was discharged in stable condition. She is surgery's assistance.    Acute on chronic hypercarbic respiratory failure/COPD no acute exacerbation: Continue oxygen, continue to wean to baseline of 2 L. Nicotine patch as an outpatient.  Acute decompensated systolic heart failure with an EF of 20%: Continue aspirin, metoprolol. Requiring 4 L of oxygen, resume home torsemide.        Discharge Instructions  Discharge Instructions    Diet - low sodium heart healthy   Complete by:  As directed    Increase activity slowly   Complete by:  As directed      Allergies as of 04/11/2017   No Known Allergies     Medication List    TAKE these medications   albuterol 108 (90  Base) MCG/ACT inhaler Commonly known as:  PROVENTIL HFA;VENTOLIN HFA Inhale 2 puffs into the lungs every 4 (four) hours as needed for wheezing or shortness of breath. What changed:  Another medication with the same name was removed. Continue taking this medication, and follow the directions you see here.   aspirin EC 81 MG tablet Take 81 mg by mouth daily.   clonazePAM 0.5 MG tablet Commonly known as:  KLONOPIN Take 1 tablet (0.5 mg total) by mouth 2 (two) times daily as needed. for anxiety   HYDROcodone-acetaminophen 10-325 MG tablet Commonly known as:  NORCO Take 0.5 tablets by mouth every 6 (six) hours as needed for severe pain.   metoprolol succinate 25 MG 24 hr tablet Commonly known as:  TOPROL-XL TAKE 1/2 TABLET BY MOUTH 2 TIMES DAILY. What changed:  See the new instructions.   nicotine 14 mg/24hr patch Commonly known as:  NICODERM CQ Place 1 patch (14 mg total) onto the skin daily.   nitroGLYCERIN 0.4 MG SL tablet Commonly known as:  NITROSTAT Place 1 tablet (0.4 mg total) under the tongue every 5 (five) minutes as needed for chest pain.   potassium chloride SA 20 MEQ tablet Commonly known as:  K-DUR,KLOR-CON TAKE 1 TABLET BY MOUTH ONCE DAILY What changed:    how much to take  how to take this  when to take this   [place] 18 MCG inhalation capsule Generic drug:  tiotropium PLACE 1 CAPSULE INTO INHALER AND INHALE DAILY   torsemide 20 MG tablet Commonly known as:  DEMADEX Take 1 tablet (20 mg total) by mouth daily.  What changed:  when to take this            Durable Medical Equipment  (From admission, onward)        Start     Ordered   04/10/17 1507  For home use only DME Tub bench  Once     04/10/17 1506     Follow-up [facility] Follow up.   Why:  A representative from [facility] will contact you to arrange start date and time for your therapy. Contact information: [place] [address] [phone]          No Known Allergies  Consultations:  Surgery   Procedures/Studies: Ct Abdomen Pelvis W Contrast  Result Date: 04/06/2017 CLINICAL DATA:  Dull aching pain in the left groin EXAM: CT ABDOMEN AND PELVIS WITH CONTRAST TECHNIQUE: Multidetector CT imaging of the abdomen and pelvis was performed using the standard protocol following bolus administration of intravenous contrast. CONTRAST:  18mL ISOVUE-300 IOPAMIDOL (ISOVUE-300) INJECTION 61% COMPARISON:  10/21/2014 FINDINGS: Lower chest: Lung bases demonstrate patchy atelectasis in the left lower echo. Small left-sided pleural effusion. Cardiomegaly. Partially visualized sternotomy changes. Hepatobiliary: Small calcified stones in the gallbladder. No biliary dilatation. No focal hepatic abnormality Pancreas: Unremarkable. No pancreatic ductal dilatation or surrounding inflammatory changes. Spleen: Normal in size without focal abnormality. Adrenals/Urinary Tract: Adrenal glands are within normal limits. 6 mm stone in the mid pole of the left kidney. Small stones in the lower pole of the right kidney. Subcentimeter cortical hypodense lesions too small to further characterize but probably cysts. Hyperdense 6 mm lesion superior pole right kidney, series 2, image number 22. The urinary bladder is unremarkable Stomach/Bowel: The stomach is nonenlarged. Postsurgical changes at the GE junction and distal stomach. Dilated loops of small bowel in the left lower quadrant measuring up to 3.8 cm. There appears to be a transition point related to a small left inguinal hernia, series 2, image number 65. Bowel distal to this is nonenlarged. No colon wall thickening. Vascular/Lymphatic: Atherosclerotic vascular disease. Patient is status post aorto iliac stent with patent appearing graft. Further decrease in size of the infrarenal abdominal aortic aneurysm measuring 3.2 cm AP, compared to 5.1 cm previously. No  significantly enlarged abdominal or pelvic lymph nodes. Reproductive: Slightly enlarged prostate with calcifications Other: Negative for free air or significant free pelvic fluid. Musculoskeletal: Degenerative changes of the spine. No acute or suspicious bone lesion. IMPRESSION: 1. Dilated fluid-filled loops of small bowel in the left lower quadrant with suspected transition point related to a small left inguinal hernia. 2. Status post aorto iliac bypass graft with further decrease in size of the previously noted infrarenal aortic aneurysm. 3. Gallstones 4. Nonobstructing stones within both kidneys. Electronically Signed   By: Donavan Foil M.D.   On: 04/06/2017 01:33   Dg Chest Port 1 View  Result Date: 04/09/2017 CLINICAL DATA:  Assess pleural effusion. EXAM: PORTABLE CHEST 1 VIEW COMPARISON:  Chest radiograph April 08, 2017 FINDINGS: Cardiac silhouette is moderately enlarged and unchanged. Status post median sternotomy for CABG. Interval extubation. Fullness of the hila persist. Mild chronic interstitial changes increased lung volumes with worsening retrocardiac consolidation and small bilateral pleural effusions. LEFT apical pleural thickening. No pneumothorax. Osteopenia. IMPRESSION: Similar small bilateral pleural effusions. Worsening retrocardiac consolidation. Stable cardiomegaly. Fullness of the hila seen with prominent vascular shadows or lymphadenopathy. COPD. Interval extubation. Electronically Signed   By: Thana Farr.D.  On: 04/09/2017 16:04   Dg Chest Port 1 View  Result Date: 04/08/2017 CLINICAL DATA:  NG tube placement EXAM: PORTABLE CHEST 1 VIEW COMPARISON:  04/06/2017 FINDINGS: 1659 hours. NG tube projects over the mediastinum. The tip is over the mid mediastinum and projects just below the carina, suggesting esophageal placement. Left base collapse/consolidation again noted. Pleural effusion evident. The cardio pericardial silhouette is enlarged. Interstitial markings are  diffusely coarsened with chronic features. IMPRESSION: 1. NG tube tip appears to be just below the level of the carina suggesting placement in the midesophagus. As the carina is not well demonstrated, close correlation recommended to exclude airway placement. 2. Cardiomegaly with retrocardiac left base collapse/consolidation and small effusion. Electronically Signed   By: Misty Stanley M.D.   On: 04/08/2017 17:20   Dg Chest Portable 1 View  Result Date: 04/06/2017 CLINICAL DATA:  NG tube placement EXAM: PORTABLE CHEST 1 VIEW COMPARISON:  04/17/2016 FINDINGS: Post sternotomy changes. Esophageal tube tip poorly visible but tubing seen to the mid stomach. Small left pleural effusion. Improving aeration at the left base. Cardiomegaly with vascular congestion. Tiny right effusion. No pneumothorax. IMPRESSION: 1. Esophageal tube being visualized to the mid stomach 2. Small pleural effusions with improving aeration at the left base. 3. Cardiomegaly with vascular congestion Electronically Signed   By: Donavan Foil M.D.   On: 04/06/2017 03:09   Dg Abd Portable 1v  Result Date: 04/08/2017 CLINICAL DATA:  NG tube placement. EXAM: PORTABLE ABDOMEN - 1 VIEW COMPARISON:  None. FINDINGS: AP semi-upright view of the abdomen at 1651 hours shows the tip of an NG tube over the mid mediastinum. This appears to project just below the carina suggesting esophageal placement. Left base collapse/ consolidation evident. Gaseous colon distention noted in this patient status post aortic endograft placement. IMPRESSION: NG tube tip projects over the mid mediastinum and appears to be just below the level of the carina suggesting esophageal placement. Nevertheless, correlation for airway placement recommended. Electronically Signed   By: Misty Stanley M.D.   On: 04/08/2017 17:18     Subjective:  No complains Discharge Exam: Vitals:   04/11/17 0500 04/11/17 0747  BP: (!) 99/48   Pulse: 87   Resp: 18   Temp: 97.9 F (36.6 C)    SpO2: 96% 92%   Vitals:   04/10/17 1300 04/10/17 2128 04/11/17 0500 04/11/17 0747  BP: (!) 97/42 (!) 94/50 (!) 99/48   Pulse: 78 70 87   Resp: 18 17 18    Temp: 97.8 F (36.6 C) 97.8 F (36.6 C) 97.9 F (36.6 C)   TempSrc: Oral Oral Oral   SpO2: 95% 95% 96% 92%  Weight:   56.8 kg (125 lb 3.5 oz)   Height:        General: Pt is alert, awake, not in acute distress Cardiovascular: RRR, S1/S2 +, no rubs, no gallops Respiratory: CTA bilaterally, no wheezing, no rhonchi Abdominal: Soft, NT, ND, bowel sounds + Extremities: no edema, no cyanosis    The results of significant diagnostics from this hospitalization (including imaging, microbiology, ancillary and laboratory) are listed below for reference.     Microbiology: Recent Results (from the past 240 hour(s))  Urine Culture     Status: None   Collection Time: 04/06/17 12:25 AM  Result Value Ref Range Status   Specimen Description URINE, CLEAN CATCH  Final   Special Requests NONE  Final   Culture   Final    NO GROWTH Performed at [facility], [address] [place], [place] [address]    Report Status 04/07/2017 FINAL  Final     Labs: BNP (last 3 results) No results for input(s): BNP in the last 8760 hours. Basic Metabolic Panel: Recent Labs  Lab 04/05/17 2342 04/08/17 0507 04/09/17 1001  NA 142 141 142  K 4.0 3.7 4.2  CL 100* 100* 101  CO2 38* 37* 35*  GLUCOSE 102* 95 95  BUN 16 <5* 9  CREATININE 0.37* 0.46* 0.51*  CALCIUM 9.1 8.8* 8.9   Liver Function Tests: Recent Labs  Lab 04/05/17 2342  AST 14*  ALT 7*  ALKPHOS 68  BILITOT 0.6  PROT 6.1*  ALBUMIN 3.6   Recent Labs  Lab 04/05/17 2342  LIPASE 24   No results for input(s): AMMONIA in the last 168 hours. CBC: Recent Labs  Lab 04/05/17 2342 04/06/17 0712 04/08/17 0507 04/09/17 1001  WBC 8.0 11.7* 11.4* 13.3*  NEUTROABS 6.0 9.7*  --   --   HGB 11.9* 11.8* 12.4* 11.8*  HCT 39.1 38.6* 40.5 37.9*  MCV 98.5 97.7 96.2 96.7  PLT 140*  125* 126* 122*   Cardiac Enzymes: No results for input(s): CKTOTAL, CKMB, CKMBINDEX, TROPONINI in the last 168 hours. BNP: Invalid input(s): POCBNP CBG: Recent Labs  Lab 04/07/17 0615 04/07/17 0814 04/08/17 1639 04/09/17 0611 04/10/17 0645  GLUCAP 77 72 143* 126* 114*   D-Dimer No results for input(s): DDIMER in the last 72 hours. Hgb A1c No results for input(s): HGBA1C in the last 72 hours. Lipid Profile No results for input(s): CHOL, HDL, LDLCALC, TRIG, CHOLHDL, LDLDIRECT in the last 72 hours. Thyroid function studies No results for input(s): TSH, T4TOTAL, T3FREE, THYROIDAB in the last 72 hours.  Invalid input(s): FREET3 Anemia work up No results for input(s): VITAMINB12, FOLATE, FERRITIN, TIBC, IRON, RETICCTPCT in the last 72 hours. Urinalysis    Component Value Date/Time   COLORURINE YELLOW 04/06/2017 0025   APPEARANCEUR CLEAR 04/06/2017 0025   LABSPEC 1.023 04/06/2017 0025   PHURINE 6.0 04/06/2017 0025   GLUCOSEU NEGATIVE 04/06/2017 0025   HGBUR NEGATIVE 04/06/2017 0025   BILIRUBINUR NEGATIVE 04/06/2017 0025   KETONESUR NEGATIVE 04/06/2017 0025   PROTEINUR NEGATIVE 04/06/2017 0025   UROBILINOGEN 1.0 09/10/2014 0919   NITRITE NEGATIVE 04/06/2017 0025   LEUKOCYTESUR NEGATIVE 04/06/2017 0025   Sepsis Labs Invalid input(s): PROCALCITONIN,  WBC,  LACTICIDVEN Microbiology Recent Results (from the past 240 hour(s))  Urine Culture     Status: None   Collection Time: 04/06/17 12:25 AM  Result Value Ref Range Status   Specimen Description URINE, CLEAN CATCH  Final   Special Requests NONE  Final   Culture   Final    NO GROWTH Performed at [facility], [place] [address], [place], [place] [address]    Report Status 04/07/2017 FINAL  Final     Time coordinating discharge: Over 30 minutes  SIGNED:   Charlynne Cousins, MD  [facility] 04/11/2017, 12:00 PM Pager   If 7PM-7AM, please contact night-coverage [email or web address] Password [email or web address]

## 2017-04-11 NOTE — Progress Notes (Signed)
Discharge instructions given. Pt verbalized understanding and all questions were answered.  

## 2017-04-11 NOTE — Progress Notes (Signed)
Patient has met discharge criteria at this time. Discharge instructions discussed with patient and family.  Patient and family verbalized understanding of instructions.  Paper prescriptions given to patient for following medications: .  Patient instructed to call office to schedule follow up appointment with surgeon. Denies any further questions or concerns.  Patient discharged home with family via wheelchair.  Instructed to call office with any further questions or concerns.

## 2017-04-11 NOTE — Progress Notes (Signed)
3 Days Post-Op   Subjective/Chief Complaint: Moving bowels  Tolerating diet up with PT    Objective: Vital signs in last 24 hours: Temp:  [97.8 F (36.6 C)-97.9 F (36.6 C)] 97.9 F (36.6 C) (12/06 0500) Pulse Rate:  [70-87] 87 (12/06 0500) Resp:  [17-18] 18 (12/06 0500) BP: (94-99)/(42-50) 99/48 (12/06 0500) SpO2:  [92 %-97 %] 92 % (12/06 0747) Weight:  [56.8 kg (125 lb 3.5 oz)] 56.8 kg (125 lb 3.5 oz) (12/06 0500) Last BM Date: 04/11/17  Intake/Output from previous day: 12/05 0701 - 12/06 0700 In: 840 [P.O.:840] Out: 902 [Urine:900; Stool:2] Intake/Output this shift: No intake/output data recorded.  Incision/Wound:CDI dressings in place flat soft abdomen  Lab Results:  Recent Labs    04/09/17 1001  WBC 13.3*  HGB 11.8*  HCT 37.9*  PLT 122*   BMET Recent Labs    04/09/17 1001  NA 142  K 4.2  CL 101  CO2 35*  GLUCOSE 95  BUN 9  CREATININE 0.51*  CALCIUM 8.9   PT/INR No results for input(s): LABPROT, INR in the last 72 hours. ABG Recent Labs    04/08/17 1645  PHART 7.354  HCO3 34.5*    Studies/Results: Dg Chest Port 1 View  Result Date: 04/09/2017 CLINICAL DATA:  Assess pleural effusion. EXAM: PORTABLE CHEST 1 VIEW COMPARISON:  Chest radiograph April 08, 2017 FINDINGS: Cardiac silhouette is moderately enlarged and unchanged. Status post median sternotomy for CABG. Interval extubation. Fullness of the hila persist. Mild chronic interstitial changes increased lung volumes with worsening retrocardiac consolidation and small bilateral pleural effusions. LEFT apical pleural thickening. No pneumothorax. Osteopenia. IMPRESSION: Similar small bilateral pleural effusions. Worsening retrocardiac consolidation. Stable cardiomegaly. Fullness of the hila seen with prominent vascular shadows or lymphadenopathy. COPD. Interval extubation. Electronically Signed   By: Elon Alas M.D.   On: 04/09/2017 16:04    Anti-infectives: Anti-infectives (From admission,  onward)   Start     Dose/Rate Route Frequency Ordered Stop   04/08/17 0915  ceFAZolin (ANCEF) IVPB 2g/100 mL premix     2 g 200 mL/hr over 30 Minutes Intravenous  Once 04/08/17 0906 04/08/17 0955   04/08/17 0904  ceFAZolin (ANCEF) 2-4 GM/100ML-% IVPB    Comments:  [name]   : cabinet override      04/08/17 3976 04/08/17 0925      Assessment/Plan: s/p Procedure(s): HERNIA REPAIR FEMORAL (Left) EXPLORATORY LAPAROTOMY INSERTION OF MESH SMALL BOWEL RESECTION Advance diet  Home if he tolerated soft diet from surgery perspective Breathing better   LOS: 5 days    Marcello Moores A Yaminah Clayborn 04/11/2017

## 2017-04-11 NOTE — Progress Notes (Signed)
Occupational Therapy Treatment Patient Details Name: Tommy Turner MRN: [id number] DOB: 1940/11/23 Today's Date: 04/11/2017    History of present illness 76 yo admitted with inguinal hernia s/p exp lap with repair. PMHx: COPD home O2 dependent, CAD, CHF, ICM, AAA repair   OT comments  Pt continues to require encouragement to participate and for OOB activity, however pt is making progress. Pt to d/c hoe with family today  Follow Up Recommendations  Home health OT;Supervision/Assistance - 24 hour    Equipment Recommendations  Tub/shower bench;Other (comment)(reacher)    Recommendations for Other Services      Precautions / Restrictions Precautions Precautions: Fall Precaution Comments: watch SpO2 Restrictions Weight Bearing Restrictions: No       Mobility Bed Mobility Overal bed mobility: Modified Independent             General bed mobility comments: with rail, HOB 30 degrees and increased time  Transfers Overall transfer level: Needs assistance Equipment used: Rolling walker (2 wheeled) Transfers: Sit to/from [facility] Sit to Stand: Min guard Stand pivot transfers: Min guard       General transfer comment: cues for hand placement, safety and breathing technique    Balance Overall balance assessment: Needs assistance Sitting-balance support: No upper extremity supported Sitting balance-Leahy Scale: Good     Standing balance support: Single extremity supported;Bilateral upper extremity supported;During functional activity Standing balance-Leahy Scale: Fair                             ADL either performed or assessed with clinical judgement   ADL Overall ADL's : Needs assistance/impaired     Grooming: Wash/dry hands;Wash/dry face;Standing;Sitting;Set up       Lower Body Bathing: Moderate assistance       Lower Body Dressing: Moderate assistance   Toilet Transfer: Min guard;RW;Cueing for safety;Cueing for  sequencing;Comfort height toilet   Toileting- Clothing Manipulation and Hygiene: Sit to/from stand;Min guard   Tub/ Shower Transfer: Min guard;3 in 1;Ambulation;Rolling walker;Cueing for sequencing;Cueing for safety   Functional mobility during ADLs: Min guard;Rolling walker;Cueing for safety;Cueing for sequencing General ADL Comments: pt required encouragement to participate with OT     Vision Patient Visual Report: No change from baseline     Perception     Praxis      Cognition Arousal/Alertness: Awake/alert Behavior During Therapy: WFL for tasks assessed/performed Overall Cognitive Status: Within Functional Limits for tasks assessed                                          Exercises     Shoulder Instructions       General Comments  requires encouragement    Pertinent Vitals/ Pain       Pain Assessment: 0-10 Pain Score: 3  Pain Location: right groin Pain Descriptors / Indicators: Sore Pain Intervention(s): Monitored during session;Repositioned;Relaxation  Home Living                                          Prior Functioning/Environment              Frequency  Min 2X/week        Progress Toward Goals  OT Goals(current goals can now be found in the care plan  section)  Progress towards OT goals: Progressing toward goals  Acute Rehab OT Goals Patient Stated Goal: return home  Plan      Co-evaluation                 AM-PAC PT "6 Clicks" Daily Activity     Outcome Measure   Help from another person eating meals?: None Help from another person taking care of personal grooming?: A Little Help from another person toileting, which includes using toliet, bedpan, or urinal?: A Little Help from another person bathing (including washing, rinsing, drying)?: A Lot Help from another person to put on and taking off regular upper body clothing?: A Little Help from another person to put on and taking off regular  lower body clothing?: A Lot 6 Click Score: 17    End of Session Equipment Utilized During Treatment: Gait belt;Rolling walker;Oxygen  OT Visit Diagnosis: Unsteadiness on feet (R26.81);Other abnormalities of gait and mobility (R26.89);Muscle weakness (generalized) (M62.81);Pain Pain - Right/Left: Right   Activity Tolerance Patient tolerated treatment well   Patient Left in chair;with call bell/phone within reach;with family/visitor present   Nurse Communication      Functional Assessment Tool Used: AM-PAC 6 Clicks Daily Activity   Time: 1443-1540 OT Time Calculation (min): 25 min  Charges: OT G-codes **NOT FOR INPATIENT CLASS** Functional Assessment Tool Used: AM-PAC 6 Clicks Daily Activity OT General Charges $OT Visit: 1 Visit OT Treatments $Therapeutic Activity: 8-22 mins     Britt Bottom 04/11/2017, 1:14 PM

## 2017-04-12 ENCOUNTER — Other Ambulatory Visit: Payer: Self-pay | Admitting: *Deleted

## 2017-04-12 NOTE — Patient Outreach (Signed)
[place] [facility]) Care Management  04/12/2017  Tommy Turner 1940/12/20 [id number]   Subjective: Telephone call to patient's home number, no answer, left HIPAA compliant voicemail message, and requested call back.    Objective: Per KPN (Knowledge Performance Now, point of care tool) and chart review, patient hospitalized 04/05/17 -04/11/17 for Incarcerated inguinal hernia.    Status post Exploratory laparotomy with small bowel resection and repair of left femoral hernia with mesh on 04/08/17.  Patient hospitalized 04/04/16 - 04/17/16 for sepsis due to community acquired pneumonia, Acute on chronic hypoxic respiratory failure (Due to pneumonia and possible COPD exacerbation), Acute encephalopathy metabolic versus medication related. Patient also has a history of hypertension, hyperlipidemia, Abdominal aortic aneurysm, and Chronic systolic CHF (congestive heart failure).  Had ED visit on 11/27/15 for urinary retention. Patient hospitalized 11/24/15 - 11/25/15 for BPH (benign prostatic hypertrophy) with urinary retention. Status post Transurethral resection of the prostate and removal of bladder stones on 11/24/15. Had ED visit on 09/08/15 for urinary retention.  [facility] Care Management closed case on 05/09/16 due to unable to contact.       Assessment: Received UMR Transition of care referral on 04/08/17.  Transition of care follow up pending patient contact.      Plan: RNCM will call patient for 2nd telephone outreach attempt, transition of care follow up, within 10 business days if no return call.     Naeem Quillin H. Annia Friendly, BSN, [place] [facility] Telephonic CM Phone: [phone] Fax: [phone]

## 2017-04-13 DIAGNOSIS — J449 Chronic obstructive pulmonary disease, unspecified: Secondary | ICD-10-CM | POA: Diagnosis not present

## 2017-04-13 DIAGNOSIS — I110 Hypertensive heart disease with heart failure: Secondary | ICD-10-CM | POA: Diagnosis not present

## 2017-04-13 DIAGNOSIS — I5022 Chronic systolic (congestive) heart failure: Secondary | ICD-10-CM | POA: Diagnosis not present

## 2017-04-13 DIAGNOSIS — Z48815 Encounter for surgical aftercare following surgery on the digestive system: Secondary | ICD-10-CM | POA: Diagnosis not present

## 2017-04-13 DIAGNOSIS — J9612 Chronic respiratory failure with hypercapnia: Secondary | ICD-10-CM | POA: Diagnosis not present

## 2017-04-13 DIAGNOSIS — J9611 Chronic respiratory failure with hypoxia: Secondary | ICD-10-CM | POA: Diagnosis not present

## 2017-04-13 DIAGNOSIS — I255 Ischemic cardiomyopathy: Secondary | ICD-10-CM | POA: Diagnosis not present

## 2017-04-13 DIAGNOSIS — I2510 Atherosclerotic heart disease of native coronary artery without angina pectoris: Secondary | ICD-10-CM | POA: Diagnosis not present

## 2017-04-13 DIAGNOSIS — G8929 Other chronic pain: Secondary | ICD-10-CM | POA: Diagnosis not present

## 2017-04-15 ENCOUNTER — Ambulatory Visit: Payer: 59 | Admitting: *Deleted

## 2017-04-15 ENCOUNTER — Other Ambulatory Visit: Payer: Self-pay | Admitting: *Deleted

## 2017-04-15 NOTE — Patient Outreach (Addendum)
[place] [facility]) Care Management  04/15/2017  GARLON TUGGLE 12/26/1940 [id number]   Subjective: Telephone call to patient's home number, spoke with wife, states Mr. Hurwitz is currently sleeping, left HIPAA compliant message with wife for Mr. Flinchbaugh, and requested call back.    Objective: Per KPN (Knowledge Performance Now, point of care tool) and chart review, patient hospitalized 04/05/17 -04/11/17 for Incarcerated inguinal hernia.    Status post Exploratory laparotomy with small bowel resection and repair of left femoral hernia with mesh on 04/08/17.  Patient hospitalized 04/04/16 - 04/17/16 for sepsis due to community acquired pneumonia, Acute on chronic hypoxic respiratory failure (Due to pneumonia and possible COPD exacerbation), Acute encephalopathy metabolic versus medication related. Patient also has a history of hypertension, hyperlipidemia, Abdominal aortic aneurysm, and Chronic systolic CHF (congestive heart failure).  Had ED visit on 11/27/15 for urinary retention. Patient hospitalized 11/24/15 - 11/25/15 for BPH (benign prostatic hypertrophy) with urinary retention. Status post Transurethral resection of the prostate and removal of bladder stones on 11/24/15. Had ED visit on 09/08/15 for urinary retention.  [facility] Care Management closed case on 05/09/16 due to unable to contact.       Assessment: Received UMR Transition of care referral on 04/08/17.  Transition of care follow up pending patient contact.      Plan: RNCM will call patient for 3rd telephone outreach attempt, transition of care follow up, within 10 business days if no return call.     Hadley Soileau H. Annia Friendly, BSN, [place] Management [facility] Telephonic CM Phone: [phone] Fax: [phone]

## 2017-04-16 ENCOUNTER — Other Ambulatory Visit: Payer: Self-pay | Admitting: *Deleted

## 2017-04-16 ENCOUNTER — Encounter: Payer: Self-pay | Admitting: *Deleted

## 2017-04-16 ENCOUNTER — Ambulatory Visit: Payer: 59 | Admitting: *Deleted

## 2017-04-16 NOTE — Patient Outreach (Signed)
Tommy Turner  04/16/2017  Tommy Turner [birth year] [id number]   Subjective: Telephone call to patient's home  number, no answer, voicemail full, and unable to leave a message.    Objective:Per KPN (Knowledge Performance Now, point of care tool) and chart review,patient hospitalized 04/05/17 -04/11/17 forIncarcerated inguinal hernia.Status postExploratory laparotomy with small bowel resection and repair of left femoral hernia with meshon 04/08/17.Patient hospitalized 04/04/16 - 04/17/16 for sepsis due to community acquired pneumonia, Acute on chronic hypoxic respiratory failure (Due to pneumonia and possible COPD exacerbation), Acute encephalopathy metabolic versus medication related. Patient also has a history of hypertension, hyperlipidemia, Abdominal aortic aneurysm, and Chronic systolic CHF (congestive heart failure).  Had ED visit on 11/27/15 for urinary retention. Patient hospitalized 11/24/15 - 11/25/15 for BPH (benign prostatic hypertrophy) with urinary retention. Status post Transurethral resection of the prostate and removal of bladder stones on 11/24/15. Had ED visit on 09/08/15 for urinary retention. Tommy Turner closed case on 05/09/16 due to unable to contact.      Assessment: Received UMR Transition of care referral on 04/08/17.Transition of care follow up pending patient contact.     Plan:RNCM will send unsuccessful outreach  letter, [facility] pamphlet, and proceed with case closure, within 10 business days if no return call.     Tommy Turner, BSN, [place] [facility] Telephonic CM Phone: [phone] Fax: [phone]

## 2017-04-17 ENCOUNTER — Other Ambulatory Visit: Payer: Self-pay | Admitting: Family Medicine

## 2017-04-17 ENCOUNTER — Telehealth: Payer: Self-pay | Admitting: Family Medicine

## 2017-04-17 NOTE — Telephone Encounter (Signed)
It would be fine to go ahead and do this 

## 2017-04-17 NOTE — Telephone Encounter (Signed)
Debbie with PT notified.

## 2017-04-17 NOTE — Telephone Encounter (Signed)
Requesting order for PT for 2 times a week for 3 weeks.

## 2017-04-18 DIAGNOSIS — G8929 Other chronic pain: Secondary | ICD-10-CM | POA: Diagnosis not present

## 2017-04-18 DIAGNOSIS — I5022 Chronic systolic (congestive) heart failure: Secondary | ICD-10-CM | POA: Diagnosis not present

## 2017-04-18 DIAGNOSIS — J9611 Chronic respiratory failure with hypoxia: Secondary | ICD-10-CM | POA: Diagnosis not present

## 2017-04-18 DIAGNOSIS — I110 Hypertensive heart disease with heart failure: Secondary | ICD-10-CM | POA: Diagnosis not present

## 2017-04-18 DIAGNOSIS — J9612 Chronic respiratory failure with hypercapnia: Secondary | ICD-10-CM | POA: Diagnosis not present

## 2017-04-18 DIAGNOSIS — I255 Ischemic cardiomyopathy: Secondary | ICD-10-CM | POA: Diagnosis not present

## 2017-04-18 DIAGNOSIS — J449 Chronic obstructive pulmonary disease, unspecified: Secondary | ICD-10-CM | POA: Diagnosis not present

## 2017-04-18 DIAGNOSIS — Z48815 Encounter for surgical aftercare following surgery on the digestive system: Secondary | ICD-10-CM | POA: Diagnosis not present

## 2017-04-18 DIAGNOSIS — I2510 Atherosclerotic heart disease of native coronary artery without angina pectoris: Secondary | ICD-10-CM | POA: Diagnosis not present

## 2017-04-18 MED FILL — VENTOLIN HFA 90 MCG INHALER: 108 (90 BAS | 16 days supply | Qty: 18 | Fill #0

## 2017-04-22 ENCOUNTER — Telehealth: Payer: Self-pay | Admitting: Family Medicine

## 2017-04-22 DIAGNOSIS — I110 Hypertensive heart disease with heart failure: Secondary | ICD-10-CM | POA: Diagnosis not present

## 2017-04-22 DIAGNOSIS — I2510 Atherosclerotic heart disease of native coronary artery without angina pectoris: Secondary | ICD-10-CM | POA: Diagnosis not present

## 2017-04-22 DIAGNOSIS — I5022 Chronic systolic (congestive) heart failure: Secondary | ICD-10-CM | POA: Diagnosis not present

## 2017-04-22 DIAGNOSIS — G8929 Other chronic pain: Secondary | ICD-10-CM | POA: Diagnosis not present

## 2017-04-22 DIAGNOSIS — I255 Ischemic cardiomyopathy: Secondary | ICD-10-CM | POA: Diagnosis not present

## 2017-04-22 DIAGNOSIS — J9611 Chronic respiratory failure with hypoxia: Secondary | ICD-10-CM | POA: Diagnosis not present

## 2017-04-22 DIAGNOSIS — J449 Chronic obstructive pulmonary disease, unspecified: Secondary | ICD-10-CM | POA: Diagnosis not present

## 2017-04-22 DIAGNOSIS — Z48815 Encounter for surgical aftercare following surgery on the digestive system: Secondary | ICD-10-CM | POA: Diagnosis not present

## 2017-04-22 DIAGNOSIS — J9612 Chronic respiratory failure with hypercapnia: Secondary | ICD-10-CM | POA: Diagnosis not present

## 2017-04-22 MED ORDER — HYDROCODONE-ACETAMINOPHEN 10-325 MG PO TABS: 1.0000 | ORAL_TABLET | Freq: Two times a day (BID) | ORAL | 0 refills | Status: DC | PRN

## 2017-04-22 NOTE — Telephone Encounter (Signed)
Requesting Rx for hydrocodone 10-325 mg.

## 2017-04-22 NOTE — Telephone Encounter (Signed)
I did check the drug registry, I recommend the hydrocodone 10 mg / 325 mg, #60, 1 twice daily, the patient will need to schedule a follow-up office visit in the first couple weeks of January in order to get additional prescriptions

## 2017-04-22 NOTE — Telephone Encounter (Signed)
Prescription up front for pick up. Left message to return call

## 2017-04-22 NOTE — Telephone Encounter (Signed)
Hydrocodone 10/325mg  #15 given by Dr Charlynne Cousins on 04/11/17

## 2017-04-23 NOTE — Telephone Encounter (Signed)
Patient notified and will schedule an appointment when he picks up the prescription.

## 2017-04-25 DIAGNOSIS — J449 Chronic obstructive pulmonary disease, unspecified: Secondary | ICD-10-CM | POA: Diagnosis not present

## 2017-04-25 DIAGNOSIS — J9611 Chronic respiratory failure with hypoxia: Secondary | ICD-10-CM | POA: Diagnosis not present

## 2017-04-25 DIAGNOSIS — J9612 Chronic respiratory failure with hypercapnia: Secondary | ICD-10-CM | POA: Diagnosis not present

## 2017-04-25 DIAGNOSIS — I255 Ischemic cardiomyopathy: Secondary | ICD-10-CM | POA: Diagnosis not present

## 2017-04-25 DIAGNOSIS — I5022 Chronic systolic (congestive) heart failure: Secondary | ICD-10-CM | POA: Diagnosis not present

## 2017-04-25 DIAGNOSIS — I110 Hypertensive heart disease with heart failure: Secondary | ICD-10-CM | POA: Diagnosis not present

## 2017-04-25 DIAGNOSIS — G8929 Other chronic pain: Secondary | ICD-10-CM | POA: Diagnosis not present

## 2017-04-25 DIAGNOSIS — Z48815 Encounter for surgical aftercare following surgery on the digestive system: Secondary | ICD-10-CM | POA: Diagnosis not present

## 2017-04-25 DIAGNOSIS — I2510 Atherosclerotic heart disease of native coronary artery without angina pectoris: Secondary | ICD-10-CM | POA: Diagnosis not present

## 2017-04-30 ENCOUNTER — Emergency Department (HOSPITAL_COMMUNITY): Payer: 59

## 2017-04-30 ENCOUNTER — Encounter (HOSPITAL_COMMUNITY): Payer: Self-pay | Admitting: Emergency Medicine

## 2017-04-30 ENCOUNTER — Inpatient Hospital Stay (HOSPITAL_COMMUNITY)
Admission: EM | Admit: 2017-04-30 | Discharge: 2017-05-11 | DRG: 388 | Disposition: A | Discharge status: Home Health | Payer: 59 | Attending: General Surgery | Admitting: General Surgery

## 2017-04-30 DIAGNOSIS — F329 Major depressive disorder, single episode, unspecified: Secondary | ICD-10-CM | POA: Diagnosis present

## 2017-04-30 DIAGNOSIS — Z87891 Personal history of nicotine dependence: Secondary | ICD-10-CM | POA: Diagnosis not present

## 2017-04-30 DIAGNOSIS — R109 Unspecified abdominal pain: Secondary | ICD-10-CM | POA: Diagnosis not present

## 2017-04-30 DIAGNOSIS — J439 Emphysema, unspecified: Secondary | ICD-10-CM | POA: Diagnosis not present

## 2017-04-30 DIAGNOSIS — E785 Hyperlipidemia, unspecified: Secondary | ICD-10-CM | POA: Diagnosis present

## 2017-04-30 DIAGNOSIS — E43 Unspecified severe protein-calorie malnutrition: Secondary | ICD-10-CM | POA: Diagnosis not present

## 2017-04-30 DIAGNOSIS — I2510 Atherosclerotic heart disease of native coronary artery without angina pectoris: Secondary | ICD-10-CM | POA: Diagnosis present

## 2017-04-30 DIAGNOSIS — I10 Essential (primary) hypertension: Secondary | ICD-10-CM

## 2017-04-30 DIAGNOSIS — Z951 Presence of aortocoronary bypass graft: Secondary | ICD-10-CM | POA: Diagnosis not present

## 2017-04-30 DIAGNOSIS — T8143XA Infection following a procedure, organ and space surgical site, initial encounter: Secondary | ICD-10-CM

## 2017-04-30 DIAGNOSIS — K4190 Unilateral femoral hernia, without obstruction or gangrene, not specified as recurrent: Secondary | ICD-10-CM | POA: Diagnosis present

## 2017-04-30 DIAGNOSIS — J9611 Chronic respiratory failure with hypoxia: Secondary | ICD-10-CM | POA: Diagnosis not present

## 2017-04-30 DIAGNOSIS — Z0189 Encounter for other specified special examinations: Secondary | ICD-10-CM

## 2017-04-30 DIAGNOSIS — I5022 Chronic systolic (congestive) heart failure: Secondary | ICD-10-CM | POA: Diagnosis not present

## 2017-04-30 DIAGNOSIS — R410 Disorientation, unspecified: Secondary | ICD-10-CM | POA: Diagnosis not present

## 2017-04-30 DIAGNOSIS — Z9981 Dependence on supplemental oxygen: Secondary | ICD-10-CM | POA: Diagnosis not present

## 2017-04-30 DIAGNOSIS — I110 Hypertensive heart disease with heart failure: Secondary | ICD-10-CM | POA: Diagnosis not present

## 2017-04-30 DIAGNOSIS — J9811 Atelectasis: Secondary | ICD-10-CM | POA: Diagnosis not present

## 2017-04-30 DIAGNOSIS — J449 Chronic obstructive pulmonary disease, unspecified: Secondary | ICD-10-CM | POA: Diagnosis present

## 2017-04-30 DIAGNOSIS — I714 Abdominal aortic aneurysm, without rupture: Secondary | ICD-10-CM | POA: Diagnosis present

## 2017-04-30 DIAGNOSIS — T8149XA Infection following a procedure, other surgical site, initial encounter: Secondary | ICD-10-CM

## 2017-04-30 DIAGNOSIS — Z681 Body mass index (BMI) 19 or less, adult: Secondary | ICD-10-CM | POA: Diagnosis not present

## 2017-04-30 DIAGNOSIS — Z825 Family history of asthma and other chronic lower respiratory diseases: Secondary | ICD-10-CM | POA: Diagnosis not present

## 2017-04-30 DIAGNOSIS — Z8619 Personal history of other infectious and parasitic diseases: Secondary | ICD-10-CM

## 2017-04-30 DIAGNOSIS — I252 Old myocardial infarction: Secondary | ICD-10-CM | POA: Diagnosis not present

## 2017-04-30 DIAGNOSIS — Z4682 Encounter for fitting and adjustment of non-vascular catheter: Secondary | ICD-10-CM | POA: Diagnosis not present

## 2017-04-30 DIAGNOSIS — I509 Heart failure, unspecified: Secondary | ICD-10-CM

## 2017-04-30 DIAGNOSIS — F419 Anxiety disorder, unspecified: Secondary | ICD-10-CM | POA: Diagnosis present

## 2017-04-30 DIAGNOSIS — J9612 Chronic respiratory failure with hypercapnia: Secondary | ICD-10-CM | POA: Diagnosis present

## 2017-04-30 DIAGNOSIS — Z903 Acquired absence of stomach [part of]: Secondary | ICD-10-CM

## 2017-04-30 DIAGNOSIS — E46 Unspecified protein-calorie malnutrition: Secondary | ICD-10-CM | POA: Diagnosis not present

## 2017-04-30 DIAGNOSIS — Z48815 Encounter for surgical aftercare following surgery on the digestive system: Secondary | ICD-10-CM | POA: Diagnosis not present

## 2017-04-30 DIAGNOSIS — L02214 Cutaneous abscess of groin: Secondary | ICD-10-CM | POA: Diagnosis not present

## 2017-04-30 DIAGNOSIS — Z7982 Long term (current) use of aspirin: Secondary | ICD-10-CM

## 2017-04-30 DIAGNOSIS — Z66 Do not resuscitate: Secondary | ICD-10-CM | POA: Diagnosis present

## 2017-04-30 DIAGNOSIS — Z8249 Family history of ischemic heart disease and other diseases of the circulatory system: Secondary | ICD-10-CM

## 2017-04-30 DIAGNOSIS — Z79899 Other long term (current) drug therapy: Secondary | ICD-10-CM

## 2017-04-30 DIAGNOSIS — K567 Ileus, unspecified: Secondary | ICD-10-CM | POA: Diagnosis not present

## 2017-04-30 DIAGNOSIS — J431 Panlobular emphysema: Secondary | ICD-10-CM | POA: Diagnosis not present

## 2017-04-30 DIAGNOSIS — K56609 Unspecified intestinal obstruction, unspecified as to partial versus complete obstruction: Secondary | ICD-10-CM | POA: Diagnosis not present

## 2017-04-30 DIAGNOSIS — K56600 Partial intestinal obstruction, unspecified as to cause: Secondary | ICD-10-CM | POA: Diagnosis not present

## 2017-04-30 DIAGNOSIS — R062 Wheezing: Secondary | ICD-10-CM

## 2017-04-30 HISTORY — DX: Dependence on supplemental oxygen: Z99.81

## 2017-04-30 HISTORY — DX: Personal history of peptic ulcer disease: Z87.11

## 2017-04-30 HISTORY — DX: Personal history of other diseases of the digestive system: Z87.19

## 2017-04-30 HISTORY — DX: Partial intestinal obstruction, unspecified as to cause: K56.600

## 2017-04-30 LAB — BASIC METABOLIC PANEL
ANION GAP: 8 (ref 5–15)
BUN: 17 mg/dL (ref 6–20)
CO2: 35 mmol/L — ABNORMAL HIGH (ref 22–32)
Calcium: 8.6 mg/dL — ABNORMAL LOW (ref 8.9–10.3)
Chloride: 96 mmol/L — ABNORMAL LOW (ref 101–111)
Creatinine, Ser: 0.65 mg/dL (ref 0.61–1.24)
Glucose, Bld: 123 mg/dL — ABNORMAL HIGH (ref 65–99)
POTASSIUM: 4.3 mmol/L (ref 3.5–5.1)
SODIUM: 139 mmol/L (ref 135–145)

## 2017-04-30 LAB — CBC WITH DIFFERENTIAL/PLATELET
BASOS ABS: 0 10*3/uL (ref 0.0–0.1)
BASOS PCT: 0 %
EOS ABS: 0.1 10*3/uL (ref 0.0–0.7)
Eosinophils Relative: 1 %
HCT: 40.4 % (ref 39.0–52.0)
HEMOGLOBIN: 11.8 g/dL — AB (ref 13.0–17.0)
Lymphocytes Relative: 10 %
Lymphs Abs: 1.1 10*3/uL (ref 0.7–4.0)
MCH: 28.9 pg (ref 26.0–34.0)
MCHC: 29.2 g/dL — AB (ref 30.0–36.0)
MCV: 98.8 fL (ref 78.0–100.0)
Monocytes Absolute: 0.9 10*3/uL (ref 0.1–1.0)
Monocytes Relative: 8 %
NEUTROS PCT: 81 %
Neutro Abs: 8.6 10*3/uL — ABNORMAL HIGH (ref 1.7–7.7)
Platelets: 252 10*3/uL (ref 150–400)
RBC: 4.09 MIL/uL — AB (ref 4.22–5.81)
RDW: 13.4 % (ref 11.5–15.5)
WBC: 10.6 10*3/uL — AB (ref 4.0–10.5)

## 2017-04-30 LAB — CBC
HCT: 34.3 % — ABNORMAL LOW (ref 39.0–52.0)
Hemoglobin: 10.3 g/dL — ABNORMAL LOW (ref 13.0–17.0)
MCH: 29.3 pg (ref 26.0–34.0)
MCHC: 30 g/dL (ref 30.0–36.0)
MCV: 97.7 fL (ref 78.0–100.0)
PLATELETS: 201 10*3/uL (ref 150–400)
RBC: 3.51 MIL/uL — ABNORMAL LOW (ref 4.22–5.81)
RDW: 13.4 % (ref 11.5–15.5)
WBC: 8.7 10*3/uL (ref 4.0–10.5)

## 2017-04-30 LAB — CREATININE, SERUM
CREATININE: 0.58 mg/dL — AB (ref 0.61–1.24)
GFR calc Af Amer: >60 mL/min (ref >60)
GFR calc non Af Amer: >60 mL/min (ref >60)

## 2017-04-30 LAB — I-STAT CG4 LACTIC ACID, ED: LACTIC ACID, VENOUS: 0.85 mmol/L (ref 0.5–1.9)

## 2017-04-30 MED ORDER — MORPHINE SULFATE (PF) 4 MG/ML IV SOLN
2.0000 mg | Freq: Four times a day (QID) | INTRAVENOUS | Status: DC | PRN
  Administered 2017-04-30 – 2017-05-06 (×10): 2 mg via INTRAVENOUS
  Filled 2017-04-30 (×12): qty 1

## 2017-04-30 MED ORDER — ONDANSETRON HCL 4 MG PO TABS
4.0000 mg | ORAL_TABLET | Freq: Four times a day (QID) | ORAL | Status: DC | PRN
  Administered 2017-05-09 – 2017-05-10 (×3): 4 mg via ORAL
  Filled 2017-04-30 (×3): qty 1

## 2017-04-30 MED ORDER — METOPROLOL TARTRATE 5 MG/5ML IV SOLN
2.5000 mg | Freq: Four times a day (QID) | INTRAVENOUS | Status: DC
  Filled 2017-04-30: qty 5

## 2017-04-30 MED ORDER — ONDANSETRON HCL 4 MG/2ML IJ SOLN
4.0000 mg | Freq: Four times a day (QID) | INTRAMUSCULAR | Status: DC | PRN
  Administered 2017-05-01 – 2017-05-11 (×10): 4 mg via INTRAVENOUS
  Filled 2017-04-30 (×11): qty 2

## 2017-04-30 MED ORDER — PIPERACILLIN-TAZOBACTAM 3.375 G IVPB
3.3750 g | Freq: Three times a day (TID) | INTRAVENOUS | Status: AC
  Administered 2017-05-01 – 2017-05-08 (×22): 3.375 g via INTRAVENOUS
  Filled 2017-04-30 (×22): qty 50

## 2017-04-30 MED ORDER — PIPERACILLIN-TAZOBACTAM 3.375 G IVPB 30 MIN
3.3750 g | Freq: Once | INTRAVENOUS | Status: AC
  Administered 2017-04-30: 3.375 g via INTRAVENOUS
  Filled 2017-04-30: qty 50

## 2017-04-30 MED ORDER — IOPAMIDOL (ISOVUE-300) INJECTION 61%
100.0000 mL | Freq: Once | INTRAVENOUS | Status: AC | PRN
  Administered 2017-04-30: 100 mL via INTRAVENOUS

## 2017-04-30 MED ORDER — SODIUM CHLORIDE 0.9% FLUSH
3.0000 mL | Freq: Two times a day (BID) | INTRAVENOUS | Status: DC
  Administered 2017-05-01 – 2017-05-08 (×7): 3 mL via INTRAVENOUS

## 2017-04-30 MED ORDER — SODIUM CHLORIDE 0.9% FLUSH
3.0000 mL | INTRAVENOUS | Status: DC | PRN
  Administered 2017-05-05: 3 mL via INTRAVENOUS
  Filled 2017-04-30: qty 3

## 2017-04-30 MED ORDER — ALBUTEROL SULFATE (2.5 MG/3ML) 0.083% IN NEBU
2.5000 mg | INHALATION_SOLUTION | RESPIRATORY_TRACT | Status: DC | PRN
  Administered 2017-05-01: 2.5 mg via RESPIRATORY_TRACT
  Filled 2017-04-30: qty 3

## 2017-04-30 MED ORDER — ACETAMINOPHEN 650 MG RE SUPP: 650.0000 mg | Freq: Four times a day (QID) | RECTAL | Status: DC | PRN

## 2017-04-30 MED ORDER — ONDANSETRON HCL 4 MG/2ML IJ SOLN
4.0000 mg | Freq: Once | INTRAMUSCULAR | Status: AC
  Administered 2017-04-30: 4 mg via INTRAVENOUS
  Filled 2017-04-30: qty 2

## 2017-04-30 MED ORDER — ACETAMINOPHEN 325 MG PO TABS
650.0000 mg | ORAL_TABLET | Freq: Four times a day (QID) | ORAL | Status: DC | PRN
  Administered 2017-05-09: 650 mg via ORAL
  Filled 2017-04-30: qty 2

## 2017-04-30 MED ORDER — HEPARIN SODIUM (PORCINE) 5000 UNIT/ML IJ SOLN
5000.0000 [IU] | Freq: Three times a day (TID) | INTRAMUSCULAR | Status: DC
  Administered 2017-05-01 – 2017-05-03 (×8): 5000 [IU] via SUBCUTANEOUS
  Filled 2017-04-30 (×8): qty 1

## 2017-04-30 MED ORDER — SODIUM CHLORIDE 0.9 % IV SOLN
250.0000 mL | INTRAVENOUS | Status: DC | PRN
  Administered 2017-05-01 – 2017-05-08 (×2): 250 mL via INTRAVENOUS

## 2017-04-30 MED ORDER — LORAZEPAM 2 MG/ML IJ SOLN: 0.5000 mg | Freq: Four times a day (QID) | INTRAMUSCULAR | Status: DC | PRN

## 2017-04-30 MED ORDER — MORPHINE SULFATE (PF) 4 MG/ML IV SOLN
4.0000 mg | Freq: Once | INTRAVENOUS | Status: AC
  Administered 2017-04-30: 4 mg via INTRAVENOUS
  Filled 2017-04-30: qty 1

## 2017-04-30 MED ORDER — SODIUM CHLORIDE 0.9 % IV SOLN
INTRAVENOUS | Status: DC
  Administered 2017-04-30: 15:00:00 via INTRAVENOUS

## 2017-04-30 NOTE — ED Provider Notes (Signed)
[facility] EMERGENCY DEPARTMENT Provider Note   CSN: [id number] Arrival date & time: 04/30/17  1414     History   Chief Complaint Chief Complaint  Patient presents with  . Post-op Problem    HPI Tommy Turner is a 76 y.o. male.  HPI  The patient is a 76 year old male with a known history of an abdominal aortic aneurysm coronary disease congestive heart failure and COPD who stopped smoking approximately a month ago.  He was admitted to the hospital on November 30 of 2018 with abdominal pain that occurred in relation to a inguinal hernia that required reduction in the emergency department by the general surgeon, he required a hernia repair at the left femoral area with an exploratory laparotomy with insertion of mesh and a small bowel resection as well.  The patient was discharged on 6 December and has been taking his hydrocodone daily (he had been on this medication for some time prior to being admitted to the hospital) and had been doing okay until the last 24 hours when he noticed increased amounts of pain which seems to fluctuate in intensity which is currently 5 out of 10, located in the suprapubic and periumbilical region and is associated with abdominal distention and one episode of vomiting.  He does report having a normal appearing small brown stool this morning but has not been passing gas.  He has nauseated, afebrile and denies any swelling of the legs.  Past Medical History:  Diagnosis Date  . AAA (abdominal aortic aneurysm) ([place])   . Aneurysm ([place])   . Anxiety    takes Xanax daily as needed  . CAD (coronary artery disease)   . CHF (congestive heart failure) ([place])   . COPD (chronic obstructive pulmonary disease) (HCC)    Albuterol inhaler and neb daily as needed;takes SPiriva daily  . Depression    takes Celexa daily  . Foley catheter in place   . History of shingles   . Hyperlipidemia    takes Atorvastatin daily  . Hypertension    takes Metoprolol and Benazepril   daily  . Myocardial infarction ([place])    20+yrs ago  . Oxygen dependent    2L/[place]   . Peripheral edema    takes Furosemide daily  . Shortness of breath     on oxygen  2 liters continuous  . Ulcer    gastric    Patient Active Problem List   Diagnosis Date Noted  . Incarcerated inguinal hernia, unilateral 04/06/2017  . Incarcerated inguinal hernia 04/06/2017  . Senile purpura ([place]) 06/18/2016  . Cachexia ([place]) 06/18/2016  . Palliative care encounter   . Goals of care, counseling/discussion   . DNR (do not resuscitate) discussion   . Acute encephalopathy 04/07/2016  . Anemia 04/05/2016  . Thrombocytopenia ([place]) 04/05/2016  . Fever 04/05/2016  . Elevated troponin 04/05/2016  . CAP (community acquired pneumonia) 04/04/2016  . Sepsis ([place]) 04/04/2016  . BPH (benign prostatic hypertrophy) with urinary retention 07/01/2015  . Hypotension 05/24/2015  . Systolic CHF, chronic ([place]) 05/24/2015  . Protein-calorie malnutrition, severe 05/04/2015  . Chronic respiratory failure with hypoxia and hypercapnia ([place]) 05/03/2015  . Chronic systolic CHF (congestive heart failure) ([place]) 05/03/2015  . Leg swelling- Bilateral leg / foot 10/21/2014  . Abdominal aortic aneurysm without rupture ([place]) 09/14/2014  . Dyspnea 06/03/2014  . Elevated PSA 03/09/2014  . Peripheral vascular disease, unspecified ([place]) 01/22/2014  . Chronic pain syndrome 10/27/2013  . Hyperglycemia 04/09/2013  . Acute respiratory failure  with hypoxia ([place]) 04/09/2013  . COPD with acute exacerbation ([place]) 04/05/2013  . COPD exacerbation ([place]) 04/05/2013  . Loss of weight 03/18/2013  . Early satiety 03/18/2013  . Dysphagia, unspecified(787.20) 03/18/2013  . COPD with hypoxia ([place]) 10/31/2012  . Hyperlipemia 10/31/2012  . CAD (coronary artery disease) 10/31/2012  . Anxiety 10/31/2012  . Abdominal aortic aneurysm ([place]) 02/02/2011  . TOBACCO ABUSE 09/20/2009  . PEPTIC ULCER DISEASE 09/20/2009  . Essential hypertension  09/19/2009  . BRONCHITIS, CHRONIC 09/19/2009    Past Surgical History:  Procedure Laterality Date  . ABDOMINAL AORTIC ENDOVASCULAR STENT GRAFT N/A 09/14/2014   Procedure: ABDOMINAL AORTIC ENDOVASCULAR STENT GRAFT;  Surgeon: Conrad [place], MD;  Location: [facility];  Service: Vascular;  Laterality: N/A;  . BOWEL RESECTION  04/08/2017   Procedure: SMALL BOWEL RESECTION;  Surgeon: Erroll Luna, MD;  Location: [facility];  Service: General;;  . CORONARY ARTERY BYPASS GRAFT  20+yrs ago   x 6  . ESOPHAGOGASTRODUODENOSCOPY    . FEMORAL HERNIA REPAIR Left 04/08/2017   Procedure: HERNIA REPAIR FEMORAL;  Surgeon: Erroll Luna, MD;  Location: [facility];  Service: General;  Laterality: Left;  . HERNIA REPAIR Left    inguinal  . INSERTION OF MESH  04/08/2017   Procedure: INSERTION OF MESH;  Surgeon: Erroll Luna, MD;  Location: [facility];  Service: General;;  . LAPAROTOMY  04/08/2017   Procedure: EXPLORATORY LAPAROTOMY;  Surgeon: Erroll Luna, MD;  Location: [facility];  Service: General;;  . LIPOMA EXCISION     right lower back  . PARTIAL GASTRECTOMY     approx 1990  . TONSILLECTOMY     as a child  . TRANSURETHRAL RESECTION OF PROSTATE N/A 11/24/2015   Procedure: TRANSURETHRAL RESECTION OF THE PROSTATE (TURP);  Surgeon: Irine Seal, MD;  Location: [facility];  Service: Urology;  Laterality: N/A;       Home Medications    Prior to Admission medications   Medication Sig Start Date End Date Taking? Authorizing Provider  aspirin EC 81 MG tablet Take 81 mg by mouth daily.    Yes [provider]  clonazePAM (KLONOPIN) 0.5 MG tablet Take 1 tablet (0.5 mg total) by mouth 2 (two) times daily as needed. for anxiety 01/15/17  Yes Luking, Elayne Snare, MD  HYDROcodone-acetaminophen (NORCO) 10-325 MG tablet Take 1 tablet by mouth 2 (two) times daily as needed for severe pain. 04/22/17  Yes Luking, Elayne Snare, MD  metoprolol succinate (TOPROL-XL) 25 MG 24 hr tablet TAKE 1/2 TABLET BY MOUTH 2 TIMES DAILY. Patient taking  differently: TAKE 1/2 TABLET BY MOUTH DAILY. 04/03/16  Yes Luking, Elayne Snare, MD  nitroGLYCERIN (NITROSTAT) 0.4 MG SL tablet Place 1 tablet (0.4 mg total) under the tongue every 5 (five) minutes as needed for chest pain. 08/02/15  Yes [name], MD  potassium chloride SA (K-DUR,KLOR-CON) 20 MEQ tablet TAKE 1 TABLET BY MOUTH ONCE DAILY Patient taking differently: TAKE 1 TABLET BY MOUTH ONCE EVERY OTHER DAY 02/26/17  Yes [name], MD  SPIRIVA HANDIHALER 18 MCG inhalation capsule PLACE 1 CAPSULE INTO INHALER AND INHALE DAILY 02/26/17  Yes Luking, Scott A, MD  torsemide (DEMADEX) 20 MG tablet Take 1 tablet (20 mg total) by mouth daily. Patient taking differently: Take 20 mg by mouth every other day.  05/22/16  Yes Luking, Elayne Snare, MD  VENTOLIN HFA 108 (90 Base) MCG/ACT inhaler INHALE 2 PUFFS BY MOUTH EVERY 4 HOURS AS NEEDED FOR WHEEZING OR SHORTNESS OF BREATH. 04/18/17  Yes Mikey Kirschner, MD  nicotine (NICODERM CQ) 14 mg/24hr patch Place 1 patch (14 mg total) onto the skin daily. Patient not taking: Reported on 04/05/2017 01/15/17   [name], MD    Family History Family History  Problem Relation Age of Onset  . Hypertension Brother   . Heart disease Brother        Heart Disease before age 73  . Heart attack Brother   . Cancer Brother   . Cancer Mother   . Diabetes Mother   . Heart disease Mother        Heart Disease before age 54  . Hypertension Mother   . Heart attack Mother   . Cancer Father        prostate  . Hypertension Father   . COPD Sister   . Diabetes Sister   . Heart disease Sister   . Hypertension Sister   . Heart attack Sister   . Colon cancer Neg Hx     Social History Social History   Tobacco Use  . Smoking status: Former Smoker    Packs/day: 0.25    Years: 58.00    Pack years: 14.50    Types: Cigarettes    Last attempt to quit: 11/02/2014    Years since quitting: 2.4  . Smokeless tobacco: Never Used  . Tobacco comment: vapor ciggs    Substance Use Topics  . Alcohol use: No    Alcohol/week: 0.0 oz  . Drug use: No     Allergies   Patient has no known allergies.   Review of Systems Review of Systems  All other systems reviewed and are negative.    Physical Exam Updated Vital Signs BP 110/65   Pulse 71   Temp 98 F (36.7 C)   Resp (!) 22   Ht 5\' 7"  (1.702 m)   Wt 56.7 kg (125 lb)   SpO2 98%   BMI 19.58 kg/m   Physical Exam  Constitutional: He appears well-developed and well-nourished. No distress.  HENT:  Head: Normocephalic and atraumatic.  Mouth/Throat: Oropharynx is clear and moist. No oropharyngeal exudate.  Eyes: Conjunctivae and EOM are normal. Pupils are equal, round, and reactive to light. Right eye exhibits no discharge. Left eye exhibits no discharge. No scleral icterus.  Neck: Normal range of motion. Neck supple. No JVD present. No thyromegaly present.  Cardiovascular: Normal rate, regular rhythm, normal heart sounds and intact distal pulses. Exam reveals no gallop and no friction rub.  No murmur heard. Pulmonary/Chest: Effort normal and breath sounds normal. No respiratory distress. He has no wheezes. He has no rales.  Abdominal: He exhibits distension. There is tenderness. There is guarding.  The patient has a distended abdomen with diffuse tympanitic sounds to percussion, he is tender in the periumbilical and lower abdomen with minimal tenderness in the left side.  He does not have a peritoneal abdomen at this time  Musculoskeletal: Normal range of motion. He exhibits no edema or tenderness.  Lymphadenopathy:    He has no cervical adenopathy.  Neurological: He is alert. Coordination normal.  Skin: Skin is warm and dry. No rash noted. No erythema.  Left inguinal incision is clean dry and intact, midline infraumbilical incision is also clean dry and intact  Psychiatric: He has a normal mood and affect. His behavior is normal.  Nursing note and vitals reviewed.    ED Treatments /  Results  Labs (all labs ordered are listed, but only abnormal results are displayed) Labs  Reviewed  CBC WITH DIFFERENTIAL/PLATELET - Abnormal; Notable for the following components:      Result Value   WBC 10.6 (*)    RBC 4.09 (*)    Hemoglobin 11.8 (*)    MCHC 29.2 (*)    Neutro Abs 8.6 (*)    All other components within normal limits  BASIC METABOLIC PANEL - Abnormal; Notable for the following components:   Chloride 96 (*)    CO2 35 (*)    Glucose, Bld 123 (*)    Calcium 8.6 (*)    All other components within normal limits  I-STAT CG4 LACTIC ACID, ED    EKG  EKG Interpretation None       Radiology Ct Abdomen Pelvis W Contrast  Result Date: 04/30/2017 CLINICAL DATA:  Pt states he had hernia repair beginning of December and has been having intermittent abd pain around incision. Saw surgeon Friday and he had some staples removed. Pt took pain meds pta with no change. EXAM: CT ABDOMEN AND PELVIS WITH CONTRAST TECHNIQUE: Multidetector CT imaging of the abdomen and pelvis was performed using the standard protocol following bolus administration of intravenous contrast. CONTRAST:  138mL ISOVUE-300 IOPAMIDOL (ISOVUE-300) INJECTION 61% COMPARISON:  04/06/2017 FINDINGS: Lower chest: Small left pleural effusion, increased in size from the prior study. There is dependent lung opacity most evident in the left lower lobe, consistent with atelectasis similar to the prior study. Heart is enlarged. There is mild distention of the distal esophagus with fluid. Hepatobiliary: No focal liver abnormality is seen. No gallstones, gallbladder wall thickening, or biliary dilatation. Pancreas: Unremarkable. No pancreatic ductal dilatation or surrounding inflammatory changes. Spleen: Normal in size without focal abnormality. Adrenals/Urinary Tract: No adrenal masses. Mild bilateral renal cortical thinning. There are small bilateral low-density renal lesions consistent with cysts. There is a 5 mm mildly  hyperattenuating lesion associated with the low-attenuation lesion at the top of the right kidney, which may reflect 2 adjacent cysts, 1 complicated by previous hemorrhage. There are bilateral nonobstructing intrarenal stones. There is mild prominence of the intrarenal collecting systems bilaterally. Ureters are normal course and in caliber. No convincing ureteral stone. Bladder is unremarkable. Stomach/Bowel: There is dilated small bowel to a maximum of 4 cm. There is no small bowel wall thickening. Stomach is mild to moderately distended. No stomach wall thickening. There are surgical vascular clips at the gastroesophageal junction. Anastomosis staples lie along the distal stomach there is a small bowel anastomosis in the right lower quadrant. The distal small bowel is decompressed. Colon is normal in caliber. There is no colonic wall thickening. Vascular/Lymphatic: Atherosclerotic disease is noted along the aorta and its branch vessels. No enlarged lymph nodes. Reproductive: Unremarkable Other: There changes from a left inguinal herniorrhaphy. There is a small irregular fluid collection along the herniorrhaphy site. There is some fluid with bubbles of air below the fascia in left anterior low pelvis at the level of the hernia repair. The fluid collection measures approximately 3.0 x 2.5 x 3.2 cm. This appears to communicate with the subcutaneous fluid across a fascial defect. There is a small amount of ascites most evident adjacent to the liver. Musculoskeletal: No fracture or acute finding. No osteoblastic or osteolytic lesions. IMPRESSION: 1. Small fluid collection in the left inguinal subcutaneous soft tissues below the incision staples, which appears to communicate with a small fluid collection with associated bubbles of air in the left anterior low pelvis just below the fascia. This latter collection measures 3.9 cm in greatest dimension. An  abscess is suspected. 2. Dilated proximal to mid small bowel with  decompressed distal small bowel, findings consistent with a partial small bowel obstruction. The exact transition point is not defined. 3. Small ascites. 4. Small left pleural effusion increased in size from the previous CT. Associated lung base opacity that is most likely all atelectasis. Electronically Signed   By: Lajean Manes M.D.   On: 04/30/2017 16:49   Dg Chest Portable 1 View  Result Date: 04/30/2017 CLINICAL DATA:  Status post nasogastric tube placement. EXAM: PORTABLE CHEST 1 VIEW COMPARISON:  04/09/2017 FINDINGS: Nasogastric tube is difficult to visualize once it courses behind the heart. It cannot be determined if it definitely extends into the stomach. An abdominal film may be helpful to confirm tip positioning. Lungs show increase in pulmonary vascular prominence since the prior chest x-ray and a component of pulmonary edema cannot be excluded. There is atelectasis at the left lung base with a probable small left pleural effusion. IMPRESSION: 1. It is difficult to visualize the distal aspect of the nasogastric tube behind the heart. End abdominal flow may help to confirm tip positioning. 2. Increase in pulmonary vascular prominence. A component of pulmonary edema cannot be excluded. 3. Atelectasis at the left lung base with probable small left pleural effusion. Electronically Signed   By: Aletta Edouard M.D.   On: 04/30/2017 18:25    Procedures Procedures (including critical care time)  Medications Ordered in ED Medications  0.9 %  sodium chloride infusion ( Intravenous New Bag/Given 04/30/17 1513)  morphine 4 MG/ML injection 4 mg (4 mg Intravenous Given 04/30/17 1513)  ondansetron (ZOFRAN) injection 4 mg (4 mg Intravenous Given 04/30/17 1513)  iopamidol (ISOVUE-300) 61 % injection 100 mL (100 mLs Intravenous Contrast Given 04/30/17 1619)  piperacillin-tazobactam (ZOSYN) IVPB 3.375 g (0 g Intravenous Stopped 04/30/17 1823)     Initial Impression / Assessment and Plan / ED Course    I have reviewed the triage vital signs and the nursing notes.  Pertinent labs & imaging results that were available during my care of the patient were reviewed by me and considered in my medical decision making (see chart for details).     I am concerned that the patient has had a recurrent bowel obstruction, he had a complicated surgery Inc. including both a small bowel resection and a hernia repair with mesh and with his vomiting distention and increasing tympanitic sounds to percussion I am concerned for this process.  Will obtain labs n.p.o. status, CT scan, IV fluids and discussion with specialist as needed pending results.  I discussed the case with general surgeon, Dr. Constance Haw who recommends transfer to [facility] where the procedure was done for interventional radiology capabilities  Discussed with Dr. Ninfa Linden who agrees that the patient would benefit from their consultation and request internal medicine admission  Discussed with Dr. Roderic Palau of the hospitalist service who is been kind enough to see the patient for admission and transfer to [facility].  NG tube was placed, n.p.o., CT scan does not show any signs of acute surgical pathology though he does have a small bowel obstruction and an intra-abdominal abscess located in the lower pelvic area  Anabiotic started.  Final Clinical Impressions(s) / ED Diagnoses   Final diagnoses:  SBO (small bowel obstruction) ([place])  Postprocedural intraabdominal abscess    ED Discharge Orders    None       Noemi Chapel, MD 04/30/17 [phone]

## 2017-04-30 NOTE — ED Notes (Signed)
Paged Dr Ninfa Linden to Dr Sabra Heck @ [phone]

## 2017-04-30 NOTE — ED Triage Notes (Signed)
Pt states he had hernia repair beginning of December and has been having intermittent abd pain around incision.  Saw surgeon Friday and he had some staples removed.  Pt took pain meds pta with no change.

## 2017-04-30 NOTE — H&P (Signed)
History and Physical    Tommy Turner HYW:[id number] DOB: [birth year] DOA: 04/30/2017  PCP: [name], MD  Patient coming from: Home  I have personally briefly reviewed patient's old medical records in [place]  Chief Complaint: Abdominal pain  HPI: Tommy Turner is a 76 y.o. male with medical history significant of chronic systolic congestive heart failure with ejection fraction of 20%, COPD on home oxygen at 3 L, recent surgery for incarcerated inguinal hernia.  He was discharged from the hospital on 12/6 and reported that he was doing fairly well.  Approximately 2 days ago he began to experience nausea.  That was the last time he had a bowel movement.  Continues to pass flatus.  Yesterday, he began having periumbilical abdominal pain.  He felt that his abdomen was more distended.  He also began having vomiting.  Denies any fever.  He has a chronic cough which is unchanged.  No worsening shortness of breath.  No dysuria  ED Course: CT scan of the abdomen and pelvis indicated partial small bowel obstruction as well as possible left inguinal abscess.  Case was discussed with general surgery who recommended NG tube placement and intravenous antibiotics.  Consideration for possible IR drain placement for abscess.  Recommendations were to transfer the patient to [facility] for further evaluation.  Review of Systems: As per HPI otherwise 10 point review of systems negative.    Past Medical History:  Diagnosis Date  . AAA (abdominal aortic aneurysm) ([place])   . Aneurysm ([place])   . Anxiety    takes Xanax daily as needed  . CAD (coronary artery disease)   . CHF (congestive heart failure) ([place])   . COPD (chronic obstructive pulmonary disease) ([facility])    Albuterol inhaler and neb daily as needed;takes SPiriva daily  . Depression    takes Celexa daily  . Foley catheter in place   . History of shingles   . Hyperlipidemia    takes Atorvastatin daily  . Hypertension    takes  Metoprolol and Benazepril  daily  . Myocardial infarction ([place])    20+yrs ago  . Oxygen dependent    2L/[place]   . Peripheral edema    takes Furosemide daily  . Shortness of breath     on oxygen  2 liters continuous  . Ulcer    gastric    Past Surgical History:  Procedure Laterality Date  . ABDOMINAL AORTIC ENDOVASCULAR STENT GRAFT N/A 09/14/2014   Procedure: ABDOMINAL AORTIC ENDOVASCULAR STENT GRAFT;  Surgeon: Conrad Jeisyville, MD;  Location: [facility];  Service: Vascular;  Laterality: N/A;  . BOWEL RESECTION  04/08/2017   Procedure: SMALL BOWEL RESECTION;  Surgeon: Erroll Luna, MD;  Location: [facility];  Service: General;;  . CORONARY ARTERY BYPASS GRAFT  20+yrs ago   x 6  . ESOPHAGOGASTRODUODENOSCOPY    . FEMORAL HERNIA REPAIR Left 04/08/2017   Procedure: HERNIA REPAIR FEMORAL;  Surgeon: Erroll Luna, MD;  Location: [facility];  Service: General;  Laterality: Left;  . HERNIA REPAIR Left    inguinal  . INSERTION OF MESH  04/08/2017   Procedure: INSERTION OF MESH;  Surgeon: Erroll Luna, MD;  Location: [facility];  Service: General;;  . LAPAROTOMY  04/08/2017   Procedure: EXPLORATORY LAPAROTOMY;  Surgeon: Erroll Luna, MD;  Location: [facility];  Service: General;;  . LIPOMA EXCISION     right lower back  . PARTIAL GASTRECTOMY     approx 1990  . TONSILLECTOMY  as a child  . TRANSURETHRAL RESECTION OF PROSTATE N/A 11/24/2015   Procedure: TRANSURETHRAL RESECTION OF THE PROSTATE (TURP);  Surgeon: Irine Seal, MD;  Location: [facility];  Service: Urology;  Laterality: N/A;     reports that he quit smoking about 2 years ago. His smoking use included cigarettes. He has a 14.50 pack-year smoking history. he has never used smokeless tobacco. He reports that he does not drink alcohol or use drugs.  No Known Allergies  Family History  Problem Relation Age of Onset  . Hypertension Brother   . Heart disease Brother        Heart Disease before age 35  . Heart attack Brother   . Cancer Brother   . Cancer  Mother   . Diabetes Mother   . Heart disease Mother        Heart Disease before age 40  . Hypertension Mother   . Heart attack Mother   . Cancer Father        prostate  . Hypertension Father   . COPD Sister   . Diabetes Sister   . Heart disease Sister   . Hypertension Sister   . Heart attack Sister   . Colon cancer Neg Hx     Prior to Admission medications   Medication Sig Start Date End Date Taking? Authorizing Provider  aspirin EC 81 MG tablet Take 81 mg by mouth daily.    Yes [provider]  clonazePAM (KLONOPIN) 0.5 MG tablet Take 1 tablet (0.5 mg total) by mouth 2 (two) times daily as needed. for anxiety 01/15/17  Yes Luking, Elayne Snare, MD  HYDROcodone-acetaminophen (NORCO) 10-325 MG tablet Take 1 tablet by mouth 2 (two) times daily as needed for severe pain. 04/22/17  Yes Luking, Elayne Snare, MD  metoprolol succinate (TOPROL-XL) 25 MG 24 hr tablet TAKE 1/2 TABLET BY MOUTH 2 TIMES DAILY. Patient taking differently: TAKE 1/2 TABLET BY MOUTH DAILY. 04/03/16  Yes Luking, Elayne Snare, MD  nitroGLYCERIN (NITROSTAT) 0.4 MG SL tablet Place 1 tablet (0.4 mg total) under the tongue every 5 (five) minutes as needed for chest pain. 08/02/15  Yes [name], MD  potassium chloride SA (K-DUR,KLOR-CON) 20 MEQ tablet TAKE 1 TABLET BY MOUTH ONCE DAILY Patient taking differently: TAKE 1 TABLET BY MOUTH ONCE EVERY OTHER DAY 02/26/17  Yes [name], MD  SPIRIVA HANDIHALER 18 MCG inhalation capsule PLACE 1 CAPSULE INTO INHALER AND INHALE DAILY 02/26/17  Yes Luking, Scott A, MD  torsemide (DEMADEX) 20 MG tablet Take 1 tablet (20 mg total) by mouth daily. Patient taking differently: Take 20 mg by mouth every other day.  05/22/16  Yes Luking, Elayne Snare, MD  VENTOLIN HFA 108 (90 Base) MCG/ACT inhaler INHALE 2 PUFFS BY MOUTH EVERY 4 HOURS AS NEEDED FOR WHEEZING OR SHORTNESS OF BREATH. 04/18/17  Yes Mikey Kirschner, MD  nicotine (NICODERM CQ) 14 mg/24hr patch Place 1 patch (14 mg total) onto the  skin daily. Patient not taking: Reported on 04/05/2017 01/15/17   [name], MD    Physical Exam: Vitals:   04/30/17 1715 04/30/17 1730 04/30/17 1800 04/30/17 1830  BP:  102/63 110/65 105/66  Pulse: 68 71 71 68  Resp: (!) 22 18 (!) 22 18  Temp:      SpO2: 97% 99% 98% 97%  Weight:      Height:        Constitutional: NAD, calm, comfortable, cachectic Vitals:   04/30/17 1715 04/30/17 1730 04/30/17 1800  04/30/17 1830  BP:  102/63 110/65 105/66  Pulse: 68 71 71 68  Resp: (!) 22 18 (!) 22 18  Temp:      SpO2: 97% 99% 98% 97%  Weight:      Height:       Eyes: PERRL, lids and conjunctivae normal ENMT: Mucous membranes are moist. Posterior pharynx clear of any exudate or lesions.Normal dentition.  Neck: normal, supple, no masses, no thyromegaly Respiratory: clear to auscultation bilaterally, no wheezing, no crackles. Normal respiratory effort. No accessory muscle use.  Cardiovascular: Regular rate and rhythm, no murmurs / rubs / gallops. No extremity edema. 2+ pedal pulses. No carotid bruits.  Abdomen: no tenderness, no masses palpated. No hepatosplenomegaly. Bowel sounds positive.  Musculoskeletal: no clubbing / cyanosis. No joint deformity upper and lower extremities. Good ROM, no contractures. Normal muscle tone.  Skin: Abdominal incisions consistent with recent surgery.  No significant erythema, tenderness or discharge noted from incisions Neurologic: CN 2-12 grossly intact. Sensation intact, DTR normal. Strength 5/5 in all 4.  Psychiatric: Normal judgment and insight. Alert and oriented x 3. Normal mood.   Labs on Admission: I have personally reviewed following labs and imaging studies  CBC: Recent Labs  Lab 04/30/17 1459  WBC 10.6*  NEUTROABS 8.6*  HGB 11.8*  HCT 40.4  MCV 98.8  PLT 161   Basic Metabolic Panel: Recent Labs  Lab 04/30/17 1459  NA 139  K 4.3  CL 96*  CO2 35*  GLUCOSE 123*  BUN 17  CREATININE 0.65  CALCIUM 8.6*   GFR: Estimated  Creatinine Clearance: 62 mL/min (by C-G formula based on SCr of 0.65 mg/dL). Liver Function Tests: No results for input(s): AST, ALT, ALKPHOS, BILITOT, PROT, ALBUMIN in the last 168 hours. No results for input(s): LIPASE, AMYLASE in the last 168 hours. No results for input(s): AMMONIA in the last 168 hours. Coagulation Profile: No results for input(s): INR, PROTIME in the last 168 hours. Cardiac Enzymes: No results for input(s): CKTOTAL, CKMB, CKMBINDEX, TROPONINI in the last 168 hours. BNP (last 3 results) No results for input(s): PROBNP in the last 8760 hours. HbA1C: No results for input(s): HGBA1C in the last 72 hours. CBG: No results for input(s): GLUCAP in the last 168 hours. Lipid Profile: No results for input(s): CHOL, HDL, LDLCALC, TRIG, CHOLHDL, LDLDIRECT in the last 72 hours. Thyroid Function Tests: No results for input(s): TSH, T4TOTAL, FREET4, T3FREE, THYROIDAB in the last 72 hours. Anemia Panel: No results for input(s): VITAMINB12, FOLATE, FERRITIN, TIBC, IRON, RETICCTPCT in the last 72 hours. Urine analysis:    Component Value Date/Time   COLORURINE YELLOW 04/06/2017 0025   APPEARANCEUR CLEAR 04/06/2017 0025   LABSPEC 1.023 04/06/2017 0025   PHURINE 6.0 04/06/2017 0025   GLUCOSEU NEGATIVE 04/06/2017 0025   HGBUR NEGATIVE 04/06/2017 0025   BILIRUBINUR NEGATIVE 04/06/2017 0025   KETONESUR NEGATIVE 04/06/2017 0025   PROTEINUR NEGATIVE 04/06/2017 0025   UROBILINOGEN 1.0 09/10/2014 0919   NITRITE NEGATIVE 04/06/2017 0025   LEUKOCYTESUR NEGATIVE 04/06/2017 0025    Radiological Exams on Admission: Ct Abdomen Pelvis W Contrast  Result Date: 04/30/2017 CLINICAL DATA:  Pt states he had hernia repair beginning of December and has been having intermittent abd pain around incision. Saw surgeon Friday and he had some staples removed. Pt took pain meds pta with no change. EXAM: CT ABDOMEN AND PELVIS WITH CONTRAST TECHNIQUE: Multidetector CT imaging of the abdomen and pelvis  was performed using the standard protocol following bolus administration of intravenous contrast.  CONTRAST:  146mL ISOVUE-300 IOPAMIDOL (ISOVUE-300) INJECTION 61% COMPARISON:  04/06/2017 FINDINGS: Lower chest: Small left pleural effusion, increased in size from the prior study. There is dependent lung opacity most evident in the left lower lobe, consistent with atelectasis similar to the prior study. Heart is enlarged. There is mild distention of the distal esophagus with fluid. Hepatobiliary: No focal liver abnormality is seen. No gallstones, gallbladder wall thickening, or biliary dilatation. Pancreas: Unremarkable. No pancreatic ductal dilatation or surrounding inflammatory changes. Spleen: Normal in size without focal abnormality. Adrenals/Urinary Tract: No adrenal masses. Mild bilateral renal cortical thinning. There are small bilateral low-density renal lesions consistent with cysts. There is a 5 mm mildly hyperattenuating lesion associated with the low-attenuation lesion at the top of the right kidney, which may reflect 2 adjacent cysts, 1 complicated by previous hemorrhage. There are bilateral nonobstructing intrarenal stones. There is mild prominence of the intrarenal collecting systems bilaterally. Ureters are normal course and in caliber. No convincing ureteral stone. Bladder is unremarkable. Stomach/Bowel: There is dilated small bowel to a maximum of 4 cm. There is no small bowel wall thickening. Stomach is mild to moderately distended. No stomach wall thickening. There are surgical vascular clips at the gastroesophageal junction. Anastomosis staples lie along the distal stomach there is a small bowel anastomosis in the right lower quadrant. The distal small bowel is decompressed. Colon is normal in caliber. There is no colonic wall thickening. Vascular/Lymphatic: Atherosclerotic disease is noted along the aorta and its branch vessels. No enlarged lymph nodes. Reproductive: Unremarkable Other: There  changes from a left inguinal herniorrhaphy. There is a small irregular fluid collection along the herniorrhaphy site. There is some fluid with bubbles of air below the fascia in left anterior low pelvis at the level of the hernia repair. The fluid collection measures approximately 3.0 x 2.5 x 3.2 cm. This appears to communicate with the subcutaneous fluid across a fascial defect. There is a small amount of ascites most evident adjacent to the liver. Musculoskeletal: No fracture or acute finding. No osteoblastic or osteolytic lesions. IMPRESSION: 1. Small fluid collection in the left inguinal subcutaneous soft tissues below the incision staples, which appears to communicate with a small fluid collection with associated bubbles of air in the left anterior low pelvis just below the fascia. This latter collection measures 3.9 cm in greatest dimension. An abscess is suspected. 2. Dilated proximal to mid small bowel with decompressed distal small bowel, findings consistent with a partial small bowel obstruction. The exact transition point is not defined. 3. Small ascites. 4. Small left pleural effusion increased in size from the previous CT. Associated lung base opacity that is most likely all atelectasis. Electronically Signed   By: Lajean Manes M.D.   On: 04/30/2017 16:49   Dg Chest Portable 1 View  Result Date: 04/30/2017 CLINICAL DATA:  Status post nasogastric tube placement. EXAM: PORTABLE CHEST 1 VIEW COMPARISON:  04/09/2017 FINDINGS: Nasogastric tube is difficult to visualize once it courses behind the heart. It cannot be determined if it definitely extends into the stomach. An abdominal film may be helpful to confirm tip positioning. Lungs show increase in pulmonary vascular prominence since the prior chest x-ray and a component of pulmonary edema cannot be excluded. There is atelectasis at the left lung base with a probable small left pleural effusion. IMPRESSION: 1. It is difficult to visualize the distal  aspect of the nasogastric tube behind the heart. End abdominal flow may help to confirm tip positioning. 2. Increase in pulmonary vascular  prominence. A component of pulmonary edema cannot be excluded. 3. Atelectasis at the left lung base with probable small left pleural effusion. Electronically Signed   By: Aletta Edouard M.D.   On: 04/30/2017 18:25    Assessment/Plan Active Problems:   Essential hypertension   Chronic respiratory failure with hypoxia and hypercapnia ([facility])   Systolic CHF, chronic ([facility])   Partial small bowel obstruction ([facility])   Inguinal abscess    1. Partial small bowel obstruction.  Continue with NG tube decompression.  N.p.o. status.  General surgery following consult.  Avoid IV fluids due to systolic dysfunction 2. Left inguinal abscess.  Recent surgery for left inguinal hernia.  On intravenous Zosyn.  General surgery will evaluate.  May need interventional radiology consultation for possible drain placement. 3. Chronic systolic congestive heart failure.  Ejection fraction of 20%.  Will hold on diuretics for now since he is n.p.o.  Appears compensated at this time.  Continue to monitor volume status. 4. Chronic respiratory failure with hypoxia.  Chronically on 3 L.  Respiratory status appears to be at baseline. 5. COPD.  No evidence of wheezing at this time.  Continue on bronchodilators as needed. 6. Hypertension.  Blood pressures currently stable.  Will hold oral metoprolol for now since he is n.p.o.  Continue on IV Lopressor.  DVT prophylaxis: heparin Code Status: DNR Family Communication: discussed with wife at the bedside Disposition Plan: transfer to [facility] Consults called: General Surgery, [place] Admission status: inpatient, medsurg   Kathie Dike MD [facility] Pager [phone]  If 7PM-7AM, please contact night-coverage [email or web address] Password [email or web address]  04/30/2017, 7:05 PM

## 2017-04-30 NOTE — Consult Note (Signed)
Reason for Consult:postop problems Referring Physician: Dr. Lessie Dings is an 76 y.o. male.  HPI: This gentleman is status post repair of a left femoral hernia with mesh as well as an exploratory laparotomy and small bowel resection by Dr. Brantley Stage on 12/3.  He was discharged on 12/6 doing well.  He reports he is done well and been eating well and having bowel movements until today.  He started developing abdominal distention with cramping abdominal pain.  He had a normal bowel movement this morning but then had emesis and presented to the emergency department.  A CT scan of the abdomen and pelvis was performed was suggested a possible bowel obstruction so he was transferred to [facility].  Past Medical History:  Diagnosis Date  . AAA (abdominal aortic aneurysm) ([facility])   . Aneurysm ([place])   . Anxiety    takes Xanax daily as needed  . CAD (coronary artery disease)   . CHF (congestive heart failure) ([place])   . COPD (chronic obstructive pulmonary disease) ([facility])    Albuterol inhaler and neb daily as needed;takes SPiriva daily  . Depression    takes Celexa daily  . Foley catheter in place   . History of shingles   . Hyperlipidemia    takes Atorvastatin daily  . Hypertension    takes Metoprolol and Benazepril  daily  . Myocardial infarction ([place])    20+yrs ago  . Oxygen dependent    2L/[place]   . Peripheral edema    takes Furosemide daily  . Shortness of breath     on oxygen  2 liters continuous  . Ulcer    gastric    Past Surgical History:  Procedure Laterality Date  . ABDOMINAL AORTIC ENDOVASCULAR STENT GRAFT N/A 09/14/2014   Procedure: ABDOMINAL AORTIC ENDOVASCULAR STENT GRAFT;  Surgeon: Conrad Severna Park, MD;  Location: [place];  Service: Vascular;  Laterality: N/A;  . BOWEL RESECTION  04/08/2017   Procedure: SMALL BOWEL RESECTION;  Surgeon: Erroll Luna, MD;  Location: [place];  Service: General;;  . CORONARY ARTERY BYPASS GRAFT  20+yrs ago   x 6  .  ESOPHAGOGASTRODUODENOSCOPY    . FEMORAL HERNIA REPAIR Left 04/08/2017   Procedure: HERNIA REPAIR FEMORAL;  Surgeon: Erroll Luna, MD;  Location: [place];  Service: General;  Laterality: Left;  . HERNIA REPAIR Left    inguinal  . INSERTION OF MESH  04/08/2017   Procedure: INSERTION OF MESH;  Surgeon: Erroll Luna, MD;  Location: [place];  Service: General;;  . LAPAROTOMY  04/08/2017   Procedure: EXPLORATORY LAPAROTOMY;  Surgeon: Erroll Luna, MD;  Location: [place];  Service: General;;  . LIPOMA EXCISION     right lower back  . PARTIAL GASTRECTOMY     approx 1990  . TONSILLECTOMY     as a child  . TRANSURETHRAL RESECTION OF PROSTATE N/A 11/24/2015   Procedure: TRANSURETHRAL RESECTION OF THE PROSTATE (TURP);  Surgeon: Irine Seal, MD;  Location: [facility];  Service: Urology;  Laterality: N/A;    Family History  Problem Relation Age of Onset  . Hypertension Brother   . Heart disease Brother        Heart Disease before age 47  . Heart attack Brother   . Cancer Brother   . Cancer Mother   . Diabetes Mother   . Heart disease Mother        Heart Disease before age 19  . Hypertension Mother   . Heart attack Mother   .  Cancer Father        prostate  . Hypertension Father   . COPD Sister   . Diabetes Sister   . Heart disease Sister   . Hypertension Sister   . Heart attack Sister   . Colon cancer Neg Hx     Social History:  reports that he quit smoking about 2 years ago. His smoking use included cigarettes. He has a 14.50 pack-year smoking history. he has never used smokeless tobacco. He reports that he does not drink alcohol or use drugs.  Allergies: No Known Allergies  Medications: I have reviewed the patient's current medications.  Results for orders placed or performed during the hospital encounter of 04/30/17 (from the past 48 hour(s))  CBC with Differential/Platelet     Status: Abnormal   Collection Time: 04/30/17  2:59 PM  Result Value Ref Range   WBC 10.6 (H) 4.0 - 10.5  K/uL   RBC 4.09 (L) 4.22 - 5.81 MIL/uL   Hemoglobin 11.8 (L) 13.0 - 17.0 g/dL   HCT 40.4 39.0 - 52.0 %   MCV 98.8 78.0 - 100.0 fL   MCH 28.9 26.0 - 34.0 pg   MCHC 29.2 (L) 30.0 - 36.0 g/dL   RDW 13.4 11.5 - 15.5 %   Platelets 252 150 - 400 K/uL   Neutrophils Relative % 81 %   Neutro Abs 8.6 (H) 1.7 - 7.7 K/uL   Lymphocytes Relative 10 %   Lymphs Abs 1.1 0.7 - 4.0 K/uL   Monocytes Relative 8 %   Monocytes Absolute 0.9 0.1 - 1.0 K/uL   Eosinophils Relative 1 %   Eosinophils Absolute 0.1 0.0 - 0.7 K/uL   Basophils Relative 0 %   Basophils Absolute 0.0 0.0 - 0.1 K/uL  Basic metabolic panel     Status: Abnormal   Collection Time: 04/30/17  2:59 PM  Result Value Ref Range   Sodium 139 135 - 145 mmol/L   Potassium 4.3 3.5 - 5.1 mmol/L   Chloride 96 (L) 101 - 111 mmol/L   CO2 35 (H) 22 - 32 mmol/L   Glucose, Bld 123 (H) 65 - 99 mg/dL   BUN 17 6 - 20 mg/dL   Creatinine, Ser 0.65 0.61 - 1.24 mg/dL   Calcium 8.6 (L) 8.9 - 10.3 mg/dL   GFR calc non Af Amer >60 >60 mL/min   GFR calc Af Amer >60 >60 mL/min    Comment: (NOTE) The eGFR has been calculated using the CKD EPI equation. This calculation has not been validated in all clinical situations. eGFR's persistently <60 mL/min signify possible Chronic Kidney Disease.    Anion gap 8 5 - 15  I-Stat CG4 Lactic Acid, ED     Status: None   Collection Time: 04/30/17  3:25 PM  Result Value Ref Range   Lactic Acid, Venous 0.85 0.5 - 1.9 mmol/L  CBC     Status: Abnormal   Collection Time: 04/30/17 10:25 PM  Result Value Ref Range   WBC 8.7 4.0 - 10.5 K/uL   RBC 3.51 (L) 4.22 - 5.81 MIL/uL   Hemoglobin 10.3 (L) 13.0 - 17.0 g/dL   HCT 34.3 (L) 39.0 - 52.0 %   MCV 97.7 78.0 - 100.0 fL   MCH 29.3 26.0 - 34.0 pg   MCHC 30.0 30.0 - 36.0 g/dL   RDW 13.4 11.5 - 15.5 %   Platelets 201 150 - 400 K/uL  Creatinine, serum     Status: Abnormal   Collection Time:  04/30/17 10:25 PM  Result Value Ref Range   Creatinine, Ser 0.58 (L) 0.61 - 1.24  mg/dL   GFR calc non Af Amer >60 >60 mL/min   GFR calc Af Amer >60 >60 mL/min    Comment: (NOTE) The eGFR has been calculated using the CKD EPI equation. This calculation has not been validated in all clinical situations. eGFR's persistently <60 mL/min signify possible Chronic Kidney Disease.     Ct Abdomen Pelvis W Contrast  Result Date: 04/30/2017 CLINICAL DATA:  Pt states he had hernia repair beginning of December and has been having intermittent abd pain around incision. Saw surgeon Friday and he had some staples removed. Pt took pain meds pta with no change. EXAM: CT ABDOMEN AND PELVIS WITH CONTRAST TECHNIQUE: Multidetector CT imaging of the abdomen and pelvis was performed using the standard protocol following bolus administration of intravenous contrast. CONTRAST:  155m ISOVUE-300 IOPAMIDOL (ISOVUE-300) INJECTION 61% COMPARISON:  04/06/2017 FINDINGS: Lower chest: Small left pleural effusion, increased in size from the prior study. There is dependent lung opacity most evident in the left lower lobe, consistent with atelectasis similar to the prior study. Heart is enlarged. There is mild distention of the distal esophagus with fluid. Hepatobiliary: No focal liver abnormality is seen. No gallstones, gallbladder wall thickening, or biliary dilatation. Pancreas: Unremarkable. No pancreatic ductal dilatation or surrounding inflammatory changes. Spleen: Normal in size without focal abnormality. Adrenals/Urinary Tract: No adrenal masses. Mild bilateral renal cortical thinning. There are small bilateral low-density renal lesions consistent with cysts. There is a 5 mm mildly hyperattenuating lesion associated with the low-attenuation lesion at the top of the right kidney, which may reflect 2 adjacent cysts, 1 complicated by previous hemorrhage. There are bilateral nonobstructing intrarenal stones. There is mild prominence of the intrarenal collecting systems bilaterally. Ureters are normal course and in  caliber. No convincing ureteral stone. Bladder is unremarkable. Stomach/Bowel: There is dilated small bowel to a maximum of 4 cm. There is no small bowel wall thickening. Stomach is mild to moderately distended. No stomach wall thickening. There are surgical vascular clips at the gastroesophageal junction. Anastomosis staples lie along the distal stomach there is a small bowel anastomosis in the right lower quadrant. The distal small bowel is decompressed. Colon is normal in caliber. There is no colonic wall thickening. Vascular/Lymphatic: Atherosclerotic disease is noted along the aorta and its branch vessels. No enlarged lymph nodes. Reproductive: Unremarkable Other: There changes from a left inguinal herniorrhaphy. There is a small irregular fluid collection along the herniorrhaphy site. There is some fluid with bubbles of air below the fascia in left anterior low pelvis at the level of the hernia repair. The fluid collection measures approximately 3.0 x 2.5 x 3.2 cm. This appears to communicate with the subcutaneous fluid across a fascial defect. There is a small amount of ascites most evident adjacent to the liver. Musculoskeletal: No fracture or acute finding. No osteoblastic or osteolytic lesions. IMPRESSION: 1. Small fluid collection in the left inguinal subcutaneous soft tissues below the incision staples, which appears to communicate with a small fluid collection with associated bubbles of air in the left anterior low pelvis just below the fascia. This latter collection measures 3.9 cm in greatest dimension. An abscess is suspected. 2. Dilated proximal to mid small bowel with decompressed distal small bowel, findings consistent with a partial small bowel obstruction. The exact transition point is not defined. 3. Small ascites. 4. Small left pleural effusion increased in size from the previous CT. Associated lung  base opacity that is most likely all atelectasis. Electronically Signed   By: Lajean Manes  M.D.   On: 04/30/2017 16:49   Dg Chest Portable 1 View  Result Date: 04/30/2017 CLINICAL DATA:  Status post nasogastric tube placement. EXAM: PORTABLE CHEST 1 VIEW COMPARISON:  04/09/2017 FINDINGS: Nasogastric tube is difficult to visualize once it courses behind the heart. It cannot be determined if it definitely extends into the stomach. An abdominal film may be helpful to confirm tip positioning. Lungs show increase in pulmonary vascular prominence since the prior chest x-ray and a component of pulmonary edema cannot be excluded. There is atelectasis at the left lung base with a probable small left pleural effusion. IMPRESSION: 1. It is difficult to visualize the distal aspect of the nasogastric tube behind the heart. End abdominal flow may help to confirm tip positioning. 2. Increase in pulmonary vascular prominence. A component of pulmonary edema cannot be excluded. 3. Atelectasis at the left lung base with probable small left pleural effusion. Electronically Signed   By: Aletta Edouard M.D.   On: 04/30/2017 18:25    Review of Systems  All other systems reviewed and are negative.  Blood pressure (!) 82/50, pulse 67, temperature 98.2 F (36.8 C), temperature source Oral, resp. rate 18, height '5\' 7"'  (1.702 m), weight 58.9 kg (129 lb 13.6 oz), SpO2 95 %. Physical Exam  Constitutional: He is oriented to person, place, and time. He appears well-developed and well-nourished. No distress.  GI: Soft. There is no tenderness. There is no guarding.  His abdomen is minimally distended.  His midline incision as well as the left groin incision are well-healed.  There is no erythema in the groin incision.  I cannot feel a palpable bulge.  The area is nontender  Neurological: He is alert and oriented to person, place, and time.    Assessment/Plan: Postoperative ileus versus partial SBO  At this point, we will continue the nasogastric suctioning and repeat his abdominal x-rays in the morning.  Hopefully  this will improve with bowel rest and IV fluids quickly.  Regarding the findings on CT scan concerning the left femoral hernia repair; I believe this may represent just postoperative hematoma and postoperative fluid as his white blood count is normal and there is no erythema or fluctuance in the groin.  We will follow him closely with you  Lacresia Darwish A 04/30/2017, 11:48 PM

## 2017-05-01 ENCOUNTER — Other Ambulatory Visit: Payer: Self-pay

## 2017-05-01 ENCOUNTER — Inpatient Hospital Stay (HOSPITAL_COMMUNITY): Payer: 59

## 2017-05-01 ENCOUNTER — Encounter (HOSPITAL_COMMUNITY): Payer: Self-pay | Admitting: General Practice

## 2017-05-01 DIAGNOSIS — T8149XA Infection following a procedure, other surgical site, initial encounter: Secondary | ICD-10-CM

## 2017-05-01 LAB — CBC
HCT: 38.5 % — ABNORMAL LOW (ref 39.0–52.0)
Hemoglobin: 11.4 g/dL — ABNORMAL LOW (ref 13.0–17.0)
MCH: 29 pg (ref 26.0–34.0)
MCHC: 29.6 g/dL — AB (ref 30.0–36.0)
MCV: 98 fL (ref 78.0–100.0)
PLATELETS: 205 10*3/uL (ref 150–400)
RBC: 3.93 MIL/uL — ABNORMAL LOW (ref 4.22–5.81)
RDW: 13.5 % (ref 11.5–15.5)
WBC: 10.5 10*3/uL (ref 4.0–10.5)

## 2017-05-01 LAB — COMPREHENSIVE METABOLIC PANEL
ALT: 10 U/L — AB (ref 17–63)
AST: 13 U/L — ABNORMAL LOW (ref 15–41)
Albumin: 2.5 g/dL — ABNORMAL LOW (ref 3.5–5.0)
Alkaline Phosphatase: 61 U/L (ref 38–126)
Anion gap: 4 — ABNORMAL LOW (ref 5–15)
BUN: 14 mg/dL (ref 6–20)
CHLORIDE: 99 mmol/L — AB (ref 101–111)
CO2: 37 mmol/L — ABNORMAL HIGH (ref 22–32)
CREATININE: 0.6 mg/dL — AB (ref 0.61–1.24)
Calcium: 8.6 mg/dL — ABNORMAL LOW (ref 8.9–10.3)
Glucose, Bld: 107 mg/dL — ABNORMAL HIGH (ref 65–99)
POTASSIUM: 4.2 mmol/L (ref 3.5–5.1)
Sodium: 140 mmol/L (ref 135–145)
Total Bilirubin: 0.8 mg/dL (ref 0.3–1.2)
Total Protein: 5.7 g/dL — ABNORMAL LOW (ref 6.5–8.1)

## 2017-05-01 LAB — PROTIME-INR
INR: 1.09
PROTHROMBIN TIME: 14 s (ref 11.4–15.2)

## 2017-05-01 MED ORDER — FUROSEMIDE 10 MG/ML IJ SOLN
20.0000 mg | Freq: Once | INTRAMUSCULAR | Status: AC
  Administered 2017-05-02: 20 mg via INTRAVENOUS
  Filled 2017-05-01: qty 2

## 2017-05-01 MED ORDER — KCL IN DEXTROSE-NACL 20-5-0.45 MEQ/L-%-% IV SOLN
INTRAVENOUS | Status: AC
  Administered 2017-05-01 – 2017-05-05 (×6): via INTRAVENOUS
  Filled 2017-05-01 (×7): qty 1000

## 2017-05-01 MED ORDER — DIATRIZOATE MEGLUMINE & SODIUM 66-10 % PO SOLN
90.0000 mL | Freq: Once | ORAL | Status: AC
  Administered 2017-05-01: 90 mL via NASOGASTRIC
  Filled 2017-05-01: qty 90

## 2017-05-01 MED ORDER — ENSURE ENLIVE PO LIQD: 237.0000 mL | Freq: Two times a day (BID) | ORAL | Status: DC

## 2017-05-01 MED ORDER — AZITHROMYCIN 500 MG IV SOLR
500.0000 mg | Freq: Every day | INTRAVENOUS | Status: DC
  Administered 2017-05-02 (×2): 500 mg via INTRAVENOUS
  Filled 2017-05-01 (×2): qty 500

## 2017-05-01 NOTE — Progress Notes (Signed)
[facility] Progress Note     Subjective: CC: abdominal pain Patient complaining of abdominal pain, only relieved by morphine. Pain is in lower abdomen near incision. Not passing flatus. No n/v. No output from NGT. BP soft.  Objective: Vital signs in last 24 hours: Temp:  [98 F (36.7 C)-98.2 F (36.8 C)] 98.1 F (36.7 C) (12/26 0500) Pulse Rate:  [64-83] 81 (12/26 0500) Resp:  [15-23] 17 (12/26 0500) BP: (82-122)/(40-71) 96/40 (12/26 0500) SpO2:  [87 %-100 %] 93 % (12/26 0500) Weight:  [56.7 kg (125 lb)-58.9 kg (129 lb 13.6 oz)] 58.9 kg (129 lb 13.6 oz) (12/25 2118)    Intake/Output from previous day: 12/25 0701 - 12/26 0700 In: 100 [IV Piggyback:100] Out: 150 [Urine:150] Intake/Output this shift: No intake/output data recorded.  PE: Gen:  Alert, NAD, pleasant Card:  Regular rate and rhythm, pedal pulses 2+ BL Pulm:  Normal effort, clear to auscultation bilaterally Abd: Soft, mildly TTP suprapubic, distended, bowel sounds hypoactive, no HSM, incisions C/D/I Skin: warm and dry, no rashes  Psych: A&Ox3   Lab Results:  Recent Labs    04/30/17 2225 05/01/17 0504  WBC 8.7 10.5  HGB 10.3* 11.4*  HCT 34.3* 38.5*  PLT 201 205   BMET Recent Labs    04/30/17 1459 04/30/17 2225 05/01/17 0504  NA 139  --  140  K 4.3  --  4.2  CL 96*  --  99*  CO2 35*  --  37*  GLUCOSE 123*  --  107*  BUN 17  --  14  CREATININE 0.65 0.58* 0.60*  CALCIUM 8.6*  --  8.6*   PT/INR Recent Labs    05/01/17 0504  LABPROT 14.0  INR 1.09   CMP     Component Value Date/Time   NA 140 05/01/2017 0504   NA 143 01/15/2017 1134   K 4.2 05/01/2017 0504   CL 99 (L) 05/01/2017 0504   CO2 37 (H) 05/01/2017 0504   GLUCOSE 107 (H) 05/01/2017 0504   BUN 14 05/01/2017 0504   BUN 8 01/15/2017 1134   CREATININE 0.60 (L) 05/01/2017 0504   CREATININE 0.51 08/12/2014 1602   CALCIUM 8.6 (L) 05/01/2017 0504   PROT 5.7 (L) 05/01/2017 0504   PROT 6.8 01/15/2017 1134   ALBUMIN 2.5  (L) 05/01/2017 0504   ALBUMIN 4.3 01/15/2017 1134   AST 13 (L) 05/01/2017 0504   ALT 10 (L) 05/01/2017 0504   ALKPHOS 61 05/01/2017 0504   BILITOT 0.8 05/01/2017 0504   BILITOT 0.5 01/15/2017 1134   GFRNONAA >60 05/01/2017 0504   GFRAA >60 05/01/2017 0504   Lipase     Component Value Date/Time   LIPASE 24 04/05/2017 2342       Studies/Results: Ct Abdomen Pelvis W Contrast  Result Date: 04/30/2017 CLINICAL DATA:  Pt states he had hernia repair beginning of December and has been having intermittent abd pain around incision. Saw surgeon Friday and he had some staples removed. Pt took pain meds pta with no change. EXAM: CT ABDOMEN AND PELVIS WITH CONTRAST TECHNIQUE: Multidetector CT imaging of the abdomen and pelvis was performed using the standard protocol following bolus administration of intravenous contrast. CONTRAST:  168mL ISOVUE-300 IOPAMIDOL (ISOVUE-300) INJECTION 61% COMPARISON:  04/06/2017 FINDINGS: Lower chest: Small left pleural effusion, increased in size from the prior study. There is dependent lung opacity most evident in the left lower lobe, consistent with atelectasis similar to the prior study. Heart is enlarged. There is mild distention of the  distal esophagus with fluid. Hepatobiliary: No focal liver abnormality is seen. No gallstones, gallbladder wall thickening, or biliary dilatation. Pancreas: Unremarkable. No pancreatic ductal dilatation or surrounding inflammatory changes. Spleen: Normal in size without focal abnormality. Adrenals/Urinary Tract: No adrenal masses. Mild bilateral renal cortical thinning. There are small bilateral low-density renal lesions consistent with cysts. There is a 5 mm mildly hyperattenuating lesion associated with the low-attenuation lesion at the top of the right kidney, which may reflect 2 adjacent cysts, 1 complicated by previous hemorrhage. There are bilateral nonobstructing intrarenal stones. There is mild prominence of the intrarenal  collecting systems bilaterally. Ureters are normal course and in caliber. No convincing ureteral stone. Bladder is unremarkable. Stomach/Bowel: There is dilated small bowel to a maximum of 4 cm. There is no small bowel wall thickening. Stomach is mild to moderately distended. No stomach wall thickening. There are surgical vascular clips at the gastroesophageal junction. Anastomosis staples lie along the distal stomach there is a small bowel anastomosis in the right lower quadrant. The distal small bowel is decompressed. Colon is normal in caliber. There is no colonic wall thickening. Vascular/Lymphatic: Atherosclerotic disease is noted along the aorta and its branch vessels. No enlarged lymph nodes. Reproductive: Unremarkable Other: There changes from a left inguinal herniorrhaphy. There is a small irregular fluid collection along the herniorrhaphy site. There is some fluid with bubbles of air below the fascia in left anterior low pelvis at the level of the hernia repair. The fluid collection measures approximately 3.0 x 2.5 x 3.2 cm. This appears to communicate with the subcutaneous fluid across a fascial defect. There is a small amount of ascites most evident adjacent to the liver. Musculoskeletal: No fracture or acute finding. No osteoblastic or osteolytic lesions. IMPRESSION: 1. Small fluid collection in the left inguinal subcutaneous soft tissues below the incision staples, which appears to communicate with a small fluid collection with associated bubbles of air in the left anterior low pelvis just below the fascia. This latter collection measures 3.9 cm in greatest dimension. An abscess is suspected. 2. Dilated proximal to mid small bowel with decompressed distal small bowel, findings consistent with a partial small bowel obstruction. The exact transition point is not defined. 3. Small ascites. 4. Small left pleural effusion increased in size from the previous CT. Associated lung base opacity that is most  likely all atelectasis. Electronically Signed   By: Lajean Manes M.D.   On: 04/30/2017 16:49   Dg Chest Portable 1 View  Result Date: 04/30/2017 CLINICAL DATA:  Status post nasogastric tube placement. EXAM: PORTABLE CHEST 1 VIEW COMPARISON:  04/09/2017 FINDINGS: Nasogastric tube is difficult to visualize once it courses behind the heart. It cannot be determined if it definitely extends into the stomach. An abdominal film may be helpful to confirm tip positioning. Lungs show increase in pulmonary vascular prominence since the prior chest x-ray and a component of pulmonary edema cannot be excluded. There is atelectasis at the left lung base with a probable small left pleural effusion. IMPRESSION: 1. It is difficult to visualize the distal aspect of the nasogastric tube behind the heart. End abdominal flow may help to confirm tip positioning. 2. Increase in pulmonary vascular prominence. A component of pulmonary edema cannot be excluded. 3. Atelectasis at the left lung base with probable small left pleural effusion. Electronically Signed   By: Aletta Edouard M.D.   On: 04/30/2017 18:25   Dg Abd Portable 1v  Result Date: 05/01/2017 CLINICAL DATA:  Small bowel obstruction. EXAM: PORTABLE  ABDOMEN - 1 VIEW COMPARISON:  CT scan dated 04/30/2017 and abdominal radiograph dated 04/08/2017 FINDINGS: New NG tube in place. Persistent dilatation of small bowel loops in the right and left sides of the abdomen. No significant change in the bowel gas pattern since the prior CT scan. Multiple surgical clips and staples are noted.  Aortic stent graft. No acute bone abnormality. No change in the atelectasis or scarring and small effusion at the left base. IMPRESSION: Persistent pattern of small bowel obstruction, unchanged. Electronically Signed   By: Lorriane Shire M.D.   On: 05/01/2017 07:45    Anti-infectives: Anti-infectives (From admission, onward)   Start     Dose/Rate Route Frequency Ordered Stop   05/01/17 0000   piperacillin-tazobactam (ZOSYN) IVPB 3.375 g     3.375 g 12.5 mL/hr over 240 Minutes Intravenous Every 8 hours 04/30/17 2213     04/30/17 1730  piperacillin-tazobactam (ZOSYN) IVPB 3.375 g     3.375 g 100 mL/hr over 30 Minutes Intravenous  Once 04/30/17 1728 04/30/17 1823       Assessment/Plan HTN COPD Chronic systolic CHF  S/P ex-lap with small bowel resection and left femoral hernia repair with mesh - Dr. Brantley Stage 12/3 Post-operative ileus vs SBO - SB protocol, will discuss with MD given recent surgery - NGT with no output - mobilize as able - hydrate with IVF, will put at 75cc/h will defer to medicine if rate needs to be changed given CHF  FEN: NPO, IVF; NGT to LIWS VTE: SCDs, SQ heparin ID: IV zosyn 12/25>>   LOS: 1 day    Brigid Re , [facility] 05/01/2017, 11:41 AM Pager: [phone] Consults: [phone] Mon-Fri 7:00 am-4:30 pm Sat-Sun 7:00 am-11:30 am

## 2017-05-01 NOTE — Progress Notes (Signed)
[facility]                                                                              Patient Demographics  Tommy Turner, is a 76 y.o. male, DOB - 1940-07-29, JEH:[id number]  Admit date - 04/30/2017   Admitting Physician Tommy Patience, MD  Outpatient Primary MD for the patient is Tommy Drown, MD  Outpatient specialists:   LOS - 1  days   Medical records reviewed and are as summarized below:    Chief Complaint  Patient presents with  . Post-op Problem       Brief summary   Patient is a 76 year old male with chronic systolic CHF, EF 37%, chronic respiratory failure, COPD on home O2 3 L, recent surgery for incarcerated inguinal hernia on 04/08/17 (Tommy Turner), was discharged from the hospital on 12/6.  Per patient approximately 2 days ago he started having nausea, no BM for the last 2 days, periumbilical abdominal pain for a day prior to admission and felt that his abdomen was getting more distended with vomiting.  CT abdomen in the ED showed partial small bowel obstruction as well as possible left inguinal abscess.  Surgery was consulted, patient was transferred from [facility] to [facility].   Assessment & Plan    Principal Problem:   Partial small bowel obstruction (HCC) -Continue NG tube decompression, n.p.o., bowel rest -General surgery consulted, recommended conservative management for now -Per surgery, small fluid collection in the left inguinal soft tissue may represent just postoperative hematoma and fluid, there is no erythema or fluctuance in the groin  Active Problems:   Essential hypertension -BP currently soft, DC'd IV Lopressor.    Chronic respiratory failure with hypoxia and hypercapnia (HCC), COPD -Chronically on 3 L, no wheezing, continue bronchodilators as needed -O2 sat 93% on 3 L  Chronic systolic CHF -EF 85%, hold diuretics for now since he is n.p.o., appears compensated, monitor I's and O's closely   Code Status: DNR  DVT  Prophylaxis: Heparin subcu Family Communication: Discussed in detail with the patient, all imaging results, lab results explained to the patient and wife at the bedside   Disposition Plan:  Time Spent in minutes   35 minutes  Procedures:  CT abdomen  Consultants:   General surgery  Antimicrobials:   IV Zosyn   Medications  Scheduled Meds: . heparin  5,000 Units Subcutaneous Q8H  . sodium chloride flush  3 mL Intravenous Q12H   Continuous Infusions: . sodium chloride 250 mL (05/01/17 0700)  . piperacillin-tazobactam (ZOSYN)  IV 3.375 g (05/01/17 0928)   PRN Meds:.sodium chloride, acetaminophen **OR** acetaminophen, albuterol, LORazepam, morphine injection, ondansetron **OR** ondansetron (ZOFRAN) IV, sodium chloride flush   Antibiotics   Anti-infectives (From admission, onward)   Start     Dose/Rate Route Frequency Ordered Stop   05/01/17 0000  piperacillin-tazobactam (ZOSYN) IVPB 3.375 g     3.375 g 12.5 mL/hr over 240 Minutes Intravenous Every 8 hours 04/30/17 2213     04/30/17 1730  piperacillin-tazobactam (ZOSYN) IVPB 3.375 g     3.375 g 100 mL/hr over 30 Minutes Intravenous  Once 04/30/17 1728 04/30/17  1823        Subjective:   Tommy Turner was seen and examined today.  Does not feel too good, had a rough night, nauseous. Patient denies dizziness, chest pain, shortness of breath, new weakness, numbess, tingling. No BM.  Objective:   Vitals:   04/30/17 1900 04/30/17 1930 04/30/17 2118 05/01/17 0500  BP: 99/66 108/70 (!) 82/50 (!) 96/40  Pulse: 70 70 67 81  Resp: (!) 23 18 18 17   Temp:   98.2 F (36.8 C) 98.1 F (36.7 C)  TempSrc:   Oral Oral  SpO2: 100% 100% 95% 93%  Weight:   58.9 kg (129 lb 13.6 oz)   Height:   5\' 7"  (1.702 m)     Intake/Output Summary (Last 24 hours) at 05/01/2017 1059 Last data filed at 05/01/2017 5465 Gross per 24 hour  Intake 100 ml  Output 150 ml  Net -50 ml     Wt Readings from Last 3 Encounters:  04/30/17 58.9  kg (129 lb 13.6 oz)  04/11/17 56.8 kg (125 lb 3.5 oz)  01/15/17 57.6 kg (127 lb)     Exam  General: Alert and oriented x 3, ill-appearing,  Eyes:   HEENT:  Atraumatic, normocephalic, NGT  Cardiovascular: S1 S2 clear, RRR   Respiratory: Clear to auscultation bilaterally, no wheezing, rales or rhonchi  Gastrointestinal: Soft, midline incision, minimally distended, nontender, hypoactive bowel sounds   Ext: no pedal edema bilaterally  Neuro: no new deficits  Musculoskeletal: No digital cyanosis, clubbing  Skin: No rashes  Psych: Normal affect and demeanor, alert and oriented x3    Data Reviewed:  I have personally reviewed following labs and imaging studies  Micro Results No results found for this or any previous visit (from the past 240 hour(s)).  Radiology Reports Ct Abdomen Pelvis W Contrast  Result Date: 04/30/2017 CLINICAL DATA:  Pt states he had hernia repair beginning of December and has been having intermittent abd pain around incision. Saw surgeon Friday and he had some staples removed. Pt took pain meds pta with no change. EXAM: CT ABDOMEN AND PELVIS WITH CONTRAST TECHNIQUE: Multidetector CT imaging of the abdomen and pelvis was performed using the standard protocol following bolus administration of intravenous contrast. CONTRAST:  15mL ISOVUE-300 IOPAMIDOL (ISOVUE-300) INJECTION 61% COMPARISON:  04/06/2017 FINDINGS: Lower chest: Small left pleural effusion, increased in size from the prior study. There is dependent lung opacity most evident in the left lower lobe, consistent with atelectasis similar to the prior study. Heart is enlarged. There is mild distention of the distal esophagus with fluid. Hepatobiliary: No focal liver abnormality is seen. No gallstones, gallbladder wall thickening, or biliary dilatation. Pancreas: Unremarkable. No pancreatic ductal dilatation or surrounding inflammatory changes. Spleen: Normal in size without focal abnormality.  Adrenals/Urinary Tract: No adrenal masses. Mild bilateral renal cortical thinning. There are small bilateral low-density renal lesions consistent with cysts. There is a 5 mm mildly hyperattenuating lesion associated with the low-attenuation lesion at the top of the right kidney, which may reflect 2 adjacent cysts, 1 complicated by previous hemorrhage. There are bilateral nonobstructing intrarenal stones. There is mild prominence of the intrarenal collecting systems bilaterally. Ureters are normal course and in caliber. No convincing ureteral stone. Bladder is unremarkable. Stomach/Bowel: There is dilated small bowel to a maximum of 4 cm. There is no small bowel wall thickening. Stomach is mild to moderately distended. No stomach wall thickening. There are surgical vascular clips at the gastroesophageal junction. Anastomosis staples lie along the distal stomach there  is a small bowel anastomosis in the right lower quadrant. The distal small bowel is decompressed. Colon is normal in caliber. There is no colonic wall thickening. Vascular/Lymphatic: Atherosclerotic disease is noted along the aorta and its branch vessels. No enlarged lymph nodes. Reproductive: Unremarkable Other: There changes from a left inguinal herniorrhaphy. There is a small irregular fluid collection along the herniorrhaphy site. There is some fluid with bubbles of air below the fascia in left anterior low pelvis at the level of the hernia repair. The fluid collection measures approximately 3.0 x 2.5 x 3.2 cm. This appears to communicate with the subcutaneous fluid across a fascial defect. There is a small amount of ascites most evident adjacent to the liver. Musculoskeletal: No fracture or acute finding. No osteoblastic or osteolytic lesions. IMPRESSION: 1. Small fluid collection in the left inguinal subcutaneous soft tissues below the incision staples, which appears to communicate with a small fluid collection with associated bubbles of air in the  left anterior low pelvis just below the fascia. This latter collection measures 3.9 cm in greatest dimension. An abscess is suspected. 2. Dilated proximal to mid small bowel with decompressed distal small bowel, findings consistent with a partial small bowel obstruction. The exact transition point is not defined. 3. Small ascites. 4. Small left pleural effusion increased in size from the previous CT. Associated lung base opacity that is most likely all atelectasis. Electronically Signed   By: Lajean Manes M.D.   On: 04/30/2017 16:49   Ct Abdomen Pelvis W Contrast  Result Date: 04/06/2017 CLINICAL DATA:  Dull aching pain in the left groin EXAM: CT ABDOMEN AND PELVIS WITH CONTRAST TECHNIQUE: Multidetector CT imaging of the abdomen and pelvis was performed using the standard protocol following bolus administration of intravenous contrast. CONTRAST:  155mL ISOVUE-300 IOPAMIDOL (ISOVUE-300) INJECTION 61% COMPARISON:  10/21/2014 FINDINGS: Lower chest: Lung bases demonstrate patchy atelectasis in the left lower echo. Small left-sided pleural effusion. Cardiomegaly. Partially visualized sternotomy changes. Hepatobiliary: Small calcified stones in the gallbladder. No biliary dilatation. No focal hepatic abnormality Pancreas: Unremarkable. No pancreatic ductal dilatation or surrounding inflammatory changes. Spleen: Normal in size without focal abnormality. Adrenals/Urinary Tract: Adrenal glands are within normal limits. 6 mm stone in the mid pole of the left kidney. Small stones in the lower pole of the right kidney. Subcentimeter cortical hypodense lesions too small to further characterize but probably cysts. Hyperdense 6 mm lesion superior pole right kidney, series 2, image number 22. The urinary bladder is unremarkable Stomach/Bowel: The stomach is nonenlarged. Postsurgical changes at the GE junction and distal stomach. Dilated loops of small bowel in the left lower quadrant measuring up to 3.8 cm. There appears to be  a transition point related to a small left inguinal hernia, series 2, image number 65. Bowel distal to this is nonenlarged. No colon wall thickening. Vascular/Lymphatic: Atherosclerotic vascular disease. Patient is status post aorto iliac stent with patent appearing graft. Further decrease in size of the infrarenal abdominal aortic aneurysm measuring 3.2 cm AP, compared to 5.1 cm previously. No significantly enlarged abdominal or pelvic lymph nodes. Reproductive: Slightly enlarged prostate with calcifications Other: Negative for free air or significant free pelvic fluid. Musculoskeletal: Degenerative changes of the spine. No acute or suspicious bone lesion. IMPRESSION: 1. Dilated fluid-filled loops of small bowel in the left lower quadrant with suspected transition point related to a small left inguinal hernia. 2. Status post aorto iliac bypass graft with further decrease in size of the previously noted infrarenal aortic aneurysm. 3. Gallstones  4. Nonobstructing stones within both kidneys. Electronically Signed   By: Donavan Foil M.D.   On: 04/06/2017 01:33   Dg Chest Portable 1 View  Result Date: 04/30/2017 CLINICAL DATA:  Status post nasogastric tube placement. EXAM: PORTABLE CHEST 1 VIEW COMPARISON:  04/09/2017 FINDINGS: Nasogastric tube is difficult to visualize once it courses behind the heart. It cannot be determined if it definitely extends into the stomach. An abdominal film may be helpful to confirm tip positioning. Lungs show increase in pulmonary vascular prominence since the prior chest x-ray and a component of pulmonary edema cannot be excluded. There is atelectasis at the left lung base with a probable small left pleural effusion. IMPRESSION: 1. It is difficult to visualize the distal aspect of the nasogastric tube behind the heart. End abdominal flow may help to confirm tip positioning. 2. Increase in pulmonary vascular prominence. A component of pulmonary edema cannot be excluded. 3.  Atelectasis at the left lung base with probable small left pleural effusion. Electronically Signed   By: Aletta Edouard M.D.   On: 04/30/2017 18:25   Dg Chest Port 1 View  Result Date: 04/09/2017 CLINICAL DATA:  Assess pleural effusion. EXAM: PORTABLE CHEST 1 VIEW COMPARISON:  Chest radiograph April 08, 2017 FINDINGS: Cardiac silhouette is moderately enlarged and unchanged. Status post median sternotomy for CABG. Interval extubation. Fullness of the hila persist. Mild chronic interstitial changes increased lung volumes with worsening retrocardiac consolidation and small bilateral pleural effusions. LEFT apical pleural thickening. No pneumothorax. Osteopenia. IMPRESSION: Similar small bilateral pleural effusions. Worsening retrocardiac consolidation. Stable cardiomegaly. Fullness of the hila seen with prominent vascular shadows or lymphadenopathy. COPD. Interval extubation. Electronically Signed   By: Elon Alas M.D.   On: 04/09/2017 16:04   Dg Chest Port 1 View  Result Date: 04/08/2017 CLINICAL DATA:  NG tube placement EXAM: PORTABLE CHEST 1 VIEW COMPARISON:  04/06/2017 FINDINGS: 1659 hours. NG tube projects over the mediastinum. The tip is over the mid mediastinum and projects just below the carina, suggesting esophageal placement. Left base collapse/consolidation again noted. Pleural effusion evident. The cardio pericardial silhouette is enlarged. Interstitial markings are diffusely coarsened with chronic features. IMPRESSION: 1. NG tube tip appears to be just below the level of the carina suggesting placement in the midesophagus. As the carina is not well demonstrated, close correlation recommended to exclude airway placement. 2. Cardiomegaly with retrocardiac left base collapse/consolidation and small effusion. Electronically Signed   By: Misty Stanley M.D.   On: 04/08/2017 17:20   Dg Chest Portable 1 View  Result Date: 04/06/2017 CLINICAL DATA:  NG tube placement EXAM: PORTABLE CHEST 1  VIEW COMPARISON:  04/17/2016 FINDINGS: Post sternotomy changes. Esophageal tube tip poorly visible but tubing seen to the mid stomach. Small left pleural effusion. Improving aeration at the left base. Cardiomegaly with vascular congestion. Tiny right effusion. No pneumothorax. IMPRESSION: 1. Esophageal tube being visualized to the mid stomach 2. Small pleural effusions with improving aeration at the left base. 3. Cardiomegaly with vascular congestion Electronically Signed   By: Donavan Foil M.D.   On: 04/06/2017 03:09   Dg Abd Portable 1v  Result Date: 05/01/2017 CLINICAL DATA:  Small bowel obstruction. EXAM: PORTABLE ABDOMEN - 1 VIEW COMPARISON:  CT scan dated 04/30/2017 and abdominal radiograph dated 04/08/2017 FINDINGS: New NG tube in place. Persistent dilatation of small bowel loops in the right and left sides of the abdomen. No significant change in the bowel gas pattern since the prior CT scan. Multiple surgical clips and staples  are noted.  Aortic stent graft. No acute bone abnormality. No change in the atelectasis or scarring and small effusion at the left base. IMPRESSION: Persistent pattern of small bowel obstruction, unchanged. Electronically Signed   By: Lorriane Shire M.D.   On: 05/01/2017 07:45   Dg Abd Portable 1v  Result Date: 04/08/2017 CLINICAL DATA:  NG tube placement. EXAM: PORTABLE ABDOMEN - 1 VIEW COMPARISON:  None. FINDINGS: AP semi-upright view of the abdomen at 1651 hours shows the tip of an NG tube over the mid mediastinum. This appears to project just below the carina suggesting esophageal placement. Left base collapse/ consolidation evident. Gaseous colon distention noted in this patient status post aortic endograft placement. IMPRESSION: NG tube tip projects over the mid mediastinum and appears to be just below the level of the carina suggesting esophageal placement. Nevertheless, correlation for airway placement recommended. Electronically Signed   By: Misty Stanley M.D.   On:  04/08/2017 17:18    Lab Data:  CBC: Recent Labs  Lab 04/30/17 1459 04/30/17 2225 05/01/17 0504  WBC 10.6* 8.7 10.5  NEUTROABS 8.6*  --   --   HGB 11.8* 10.3* 11.4*  HCT 40.4 34.3* 38.5*  MCV 98.8 97.7 98.0  PLT 252 201 740   Basic Metabolic Panel: Recent Labs  Lab 04/30/17 1459 04/30/17 2225 05/01/17 0504  NA 139  --  140  K 4.3  --  4.2  CL 96*  --  99*  CO2 35*  --  37*  GLUCOSE 123*  --  107*  BUN 17  --  14  CREATININE 0.65 0.58* 0.60*  CALCIUM 8.6*  --  8.6*   GFR: Estimated Creatinine Clearance: 64.4 mL/min (A) (by C-G formula based on SCr of 0.6 mg/dL (L)). Liver Function Tests: Recent Labs  Lab 05/01/17 0504  AST 13*  ALT 10*  ALKPHOS 61  BILITOT 0.8  PROT 5.7*  ALBUMIN 2.5*   No results for input(s): LIPASE, AMYLASE in the last 168 hours. No results for input(s): AMMONIA in the last 168 hours. Coagulation Profile: Recent Labs  Lab 05/01/17 0504  INR 1.09   Cardiac Enzymes: No results for input(s): CKTOTAL, CKMB, CKMBINDEX, TROPONINI in the last 168 hours. BNP (last 3 results) No results for input(s): PROBNP in the last 8760 hours. HbA1C: No results for input(s): HGBA1C in the last 72 hours. CBG: No results for input(s): GLUCAP in the last 168 hours. Lipid Profile: No results for input(s): CHOL, HDL, LDLCALC, TRIG, CHOLHDL, LDLDIRECT in the last 72 hours. Thyroid Function Tests: No results for input(s): TSH, T4TOTAL, FREET4, T3FREE, THYROIDAB in the last 72 hours. Anemia Panel: No results for input(s): VITAMINB12, FOLATE, FERRITIN, TIBC, IRON, RETICCTPCT in the last 72 hours. Urine analysis:    Component Value Date/Time   COLORURINE YELLOW 04/06/2017 0025   APPEARANCEUR CLEAR 04/06/2017 0025   LABSPEC 1.023 04/06/2017 0025   PHURINE 6.0 04/06/2017 0025   GLUCOSEU NEGATIVE 04/06/2017 0025   HGBUR NEGATIVE 04/06/2017 0025   BILIRUBINUR NEGATIVE 04/06/2017 0025   KETONESUR NEGATIVE 04/06/2017 0025   PROTEINUR NEGATIVE 04/06/2017 0025    UROBILINOGEN 1.0 09/10/2014 0919   NITRITE NEGATIVE 04/06/2017 0025   LEUKOCYTESUR NEGATIVE 04/06/2017 0025     Rumi Kolodziej M.D. [facility] 05/01/2017, 10:59 AM  Pager: [phone] Between 7am to 7pm - call Pager - [phone]  After 7pm go to [email or web address] - password [email or web address]  Call night coverage person covering after 7pm

## 2017-05-02 ENCOUNTER — Inpatient Hospital Stay (HOSPITAL_COMMUNITY): Payer: 59

## 2017-05-02 ENCOUNTER — Encounter: Payer: Self-pay | Admitting: *Deleted

## 2017-05-02 DIAGNOSIS — T8143XA Infection following a procedure, organ and space surgical site, initial encounter: Secondary | ICD-10-CM

## 2017-05-02 DIAGNOSIS — I5022 Chronic systolic (congestive) heart failure: Secondary | ICD-10-CM

## 2017-05-02 DIAGNOSIS — I110 Hypertensive heart disease with heart failure: Secondary | ICD-10-CM | POA: Diagnosis not present

## 2017-05-02 DIAGNOSIS — T8149XA Infection following a procedure, other surgical site, initial encounter: Secondary | ICD-10-CM

## 2017-05-02 DIAGNOSIS — J449 Chronic obstructive pulmonary disease, unspecified: Secondary | ICD-10-CM

## 2017-05-02 DIAGNOSIS — K56609 Unspecified intestinal obstruction, unspecified as to partial versus complete obstruction: Secondary | ICD-10-CM

## 2017-05-02 DIAGNOSIS — I509 Heart failure, unspecified: Secondary | ICD-10-CM

## 2017-05-02 DIAGNOSIS — Z48815 Encounter for surgical aftercare following surgery on the digestive system: Secondary | ICD-10-CM | POA: Diagnosis not present

## 2017-05-02 DIAGNOSIS — R410 Disorientation, unspecified: Secondary | ICD-10-CM | POA: Diagnosis not present

## 2017-05-02 LAB — CBC
HEMATOCRIT: 39 % (ref 39.0–52.0)
Hemoglobin: 11.8 g/dL — ABNORMAL LOW (ref 13.0–17.0)
MCH: 29.2 pg (ref 26.0–34.0)
MCHC: 30.3 g/dL (ref 30.0–36.0)
MCV: 96.5 fL (ref 78.0–100.0)
Platelets: 225 10*3/uL (ref 150–400)
RBC: 4.04 MIL/uL — ABNORMAL LOW (ref 4.22–5.81)
RDW: 13.4 % (ref 11.5–15.5)
WBC: 11.3 10*3/uL — AB (ref 4.0–10.5)

## 2017-05-02 LAB — BLOOD GAS, ARTERIAL
ACID-BASE EXCESS: 14.7 mmol/L — AB (ref 0.0–2.0)
BICARBONATE: 39.7 mmol/L — AB (ref 20.0–28.0)
DRAWN BY: 24610
O2 Content: 3 L/min
O2 Saturation: 95.1 %
PATIENT TEMPERATURE: 98.6
pCO2 arterial: 59 mmHg — ABNORMAL HIGH (ref 32.0–48.0)
pH, Arterial: 7.444 (ref 7.350–7.450)
pO2, Arterial: 88.5 mmHg (ref 83.0–108.0)

## 2017-05-02 LAB — BASIC METABOLIC PANEL
ANION GAP: 7 (ref 5–15)
BUN: 15 mg/dL (ref 6–20)
CALCIUM: 8.8 mg/dL — AB (ref 8.9–10.3)
CO2: 37 mmol/L — AB (ref 22–32)
Chloride: 97 mmol/L — ABNORMAL LOW (ref 101–111)
Creatinine, Ser: 0.74 mg/dL (ref 0.61–1.24)
Glucose, Bld: 228 mg/dL — ABNORMAL HIGH (ref 65–99)
Potassium: 4.1 mmol/L (ref 3.5–5.1)
Sodium: 141 mmol/L (ref 135–145)

## 2017-05-02 MED ORDER — PROMETHAZINE HCL 25 MG/ML IJ SOLN
12.5000 mg | INTRAMUSCULAR | Status: DC | PRN
  Administered 2017-05-02: 12.5 mg via INTRAVENOUS
  Filled 2017-05-02: qty 1

## 2017-05-02 MED ORDER — LACTATED RINGERS IV BOLUS (SEPSIS)
500.0000 mL | Freq: Once | INTRAVENOUS | Status: AC
  Administered 2017-05-02: 500 mL via INTRAVENOUS

## 2017-05-02 NOTE — Telephone Encounter (Signed)
This encounter was created in error - please disregard.

## 2017-05-02 NOTE — Progress Notes (Signed)
[facility] Progress Note     Subjective: CC: abdominal pain Still with abdominal pain. No flatus or BM yet. Plain films overnight (8hr films) show contrast in small bowel.   Objective: Vital signs in last 24 hours: Temp:  [97.5 F (36.4 C)-98.4 F (36.9 C)] 98.4 F (36.9 C) (12/27 0543) Pulse Rate:  [86-97] 86 (12/27 0543) Resp:  [17-19] 17 (12/27 0543) BP: (102-109)/(60-71) 109/71 (12/27 0543) SpO2:  [85 %-96 %] 96 % (12/27 0543)    Intake/Output from previous day: 12/26 0701 - 12/27 0700 In: 670 [P.O.:60; I.V.:370; NG/GT:90; IV Piggyback:150] Out: 400 [Urine:50; Emesis/NG output:350] Intake/Output this shift: No intake/output data recorded.  PE: Gen:  Alert, NAD, pleasant Card:  Regular rate and rhythm, pedal pulses 2+ BL Pulm:  Normal effort, clear to auscultation bilaterally Abd: Soft, mildly TTP suprapubic, distended, no HSM, incisions C/D/I Skin: warm and dry, no rashes  Psych: A&Ox3   Lab Results:  Recent Labs    05/01/17 0504 05/02/17 0627  WBC 10.5 11.3*  HGB 11.4* 11.8*  HCT 38.5* 39.0  PLT 205 225   BMET Recent Labs    05/01/17 0504 05/02/17 0627  NA 140 141  K 4.2 4.1  CL 99* 97*  CO2 37* 37*  GLUCOSE 107* 228*  BUN 14 15  CREATININE 0.60* 0.74  CALCIUM 8.6* 8.8*   PT/INR Recent Labs    05/01/17 0504  LABPROT 14.0  INR 1.09   CMP     Component Value Date/Time   NA 141 05/02/2017 0627   NA 143 01/15/2017 1134   K 4.1 05/02/2017 0627   CL 97 (L) 05/02/2017 0627   CO2 37 (H) 05/02/2017 0627   GLUCOSE 228 (H) 05/02/2017 0627   BUN 15 05/02/2017 0627   BUN 8 01/15/2017 1134   CREATININE 0.74 05/02/2017 0627   CREATININE 0.51 08/12/2014 1602   CALCIUM 8.8 (L) 05/02/2017 0627   PROT 5.7 (L) 05/01/2017 0504   PROT 6.8 01/15/2017 1134   ALBUMIN 2.5 (L) 05/01/2017 0504   ALBUMIN 4.3 01/15/2017 1134   AST 13 (L) 05/01/2017 0504   ALT 10 (L) 05/01/2017 0504   ALKPHOS 61 05/01/2017 0504   BILITOT 0.8 05/01/2017 0504    BILITOT 0.5 01/15/2017 1134   GFRNONAA >60 05/02/2017 0627   GFRAA >60 05/02/2017 0627   Lipase     Component Value Date/Time   LIPASE 24 04/05/2017 2342       Studies/Results: Ct Abdomen Pelvis W Contrast  Result Date: 04/30/2017 CLINICAL DATA:  Pt states he had hernia repair beginning of December and has been having intermittent abd pain around incision. Saw surgeon Friday and he had some staples removed. Pt took pain meds pta with no change. EXAM: CT ABDOMEN AND PELVIS WITH CONTRAST TECHNIQUE: Multidetector CT imaging of the abdomen and pelvis was performed using the standard protocol following bolus administration of intravenous contrast. CONTRAST:  160mL ISOVUE-300 IOPAMIDOL (ISOVUE-300) INJECTION 61% COMPARISON:  04/06/2017 FINDINGS: Lower chest: Small left pleural effusion, increased in size from the prior study. There is dependent lung opacity most evident in the left lower lobe, consistent with atelectasis similar to the prior study. Heart is enlarged. There is mild distention of the distal esophagus with fluid. Hepatobiliary: No focal liver abnormality is seen. No gallstones, gallbladder wall thickening, or biliary dilatation. Pancreas: Unremarkable. No pancreatic ductal dilatation or surrounding inflammatory changes. Spleen: Normal in size without focal abnormality. Adrenals/Urinary Tract: No adrenal masses. Mild bilateral renal cortical thinning. There are small  bilateral low-density renal lesions consistent with cysts. There is a 5 mm mildly hyperattenuating lesion associated with the low-attenuation lesion at the top of the right kidney, which may reflect 2 adjacent cysts, 1 complicated by previous hemorrhage. There are bilateral nonobstructing intrarenal stones. There is mild prominence of the intrarenal collecting systems bilaterally. Ureters are normal course and in caliber. No convincing ureteral stone. Bladder is unremarkable. Stomach/Bowel: There is dilated small bowel to a  maximum of 4 cm. There is no small bowel wall thickening. Stomach is mild to moderately distended. No stomach wall thickening. There are surgical vascular clips at the gastroesophageal junction. Anastomosis staples lie along the distal stomach there is a small bowel anastomosis in the right lower quadrant. The distal small bowel is decompressed. Colon is normal in caliber. There is no colonic wall thickening. Vascular/Lymphatic: Atherosclerotic disease is noted along the aorta and its branch vessels. No enlarged lymph nodes. Reproductive: Unremarkable Other: There changes from a left inguinal herniorrhaphy. There is a small irregular fluid collection along the herniorrhaphy site. There is some fluid with bubbles of air below the fascia in left anterior low pelvis at the level of the hernia repair. The fluid collection measures approximately 3.0 x 2.5 x 3.2 cm. This appears to communicate with the subcutaneous fluid across a fascial defect. There is a small amount of ascites most evident adjacent to the liver. Musculoskeletal: No fracture or acute finding. No osteoblastic or osteolytic lesions. IMPRESSION: 1. Small fluid collection in the left inguinal subcutaneous soft tissues below the incision staples, which appears to communicate with a small fluid collection with associated bubbles of air in the left anterior low pelvis just below the fascia. This latter collection measures 3.9 cm in greatest dimension. An abscess is suspected. 2. Dilated proximal to mid small bowel with decompressed distal small bowel, findings consistent with a partial small bowel obstruction. The exact transition point is not defined. 3. Small ascites. 4. Small left pleural effusion increased in size from the previous CT. Associated lung base opacity that is most likely all atelectasis. Electronically Signed   By: Lajean Manes M.D.   On: 04/30/2017 16:49   Dg Chest Port 1 View  Result Date: 05/01/2017 CLINICAL DATA:  Wheezing COPD EXAM:  PORTABLE CHEST 1 VIEW COMPARISON:  04/30/2017, 04/09/2017, 04/17/2016 FINDINGS: Post sternotomy changes. Esophageal tube appears to course below the diaphragm but tip is non included, caliber change in the esophageal tube at the level of aortic arch possibly due to contrast in the tube; there appears to be radiopaque contrast in the left upper quadrant. Cardiomegaly but with improved aeration at the left base. Probable small effusions. Increased left upper and mid lung opacity, suspect foci of inflammatory infiltrate. Continued vascular calcification. No pneumothorax. IMPRESSION: 1. Cardiomegaly with probable tiny effusions. Persistent vascular congestion 2. Probable small pleural effusions. Slightly improved aeration at the left lung base but increasing infiltrate in the left upper lung and left perihilar region. Electronically Signed   By: Donavan Foil M.D.   On: 05/01/2017 23:31   Dg Chest Portable 1 View  Result Date: 04/30/2017 CLINICAL DATA:  Status post nasogastric tube placement. EXAM: PORTABLE CHEST 1 VIEW COMPARISON:  04/09/2017 FINDINGS: Nasogastric tube is difficult to visualize once it courses behind the heart. It cannot be determined if it definitely extends into the stomach. An abdominal film may be helpful to confirm tip positioning. Lungs show increase in pulmonary vascular prominence since the prior chest x-ray and a component of pulmonary edema cannot  be excluded. There is atelectasis at the left lung base with a probable small left pleural effusion. IMPRESSION: 1. It is difficult to visualize the distal aspect of the nasogastric tube behind the heart. End abdominal flow may help to confirm tip positioning. 2. Increase in pulmonary vascular prominence. A component of pulmonary edema cannot be excluded. 3. Atelectasis at the left lung base with probable small left pleural effusion. Electronically Signed   By: Aletta Edouard M.D.   On: 04/30/2017 18:25   Dg Abd Portable 1v-small Bowel  Obstruction Protocol-initial, 8 Hr Delay  Result Date: 05/02/2017 CLINICAL DATA:  8 hour follow-up for small-bowel obstruction EXAM: PORTABLE ABDOMEN - 1 VIEW COMPARISON:  05/01/2017 FINDINGS: 8 hour follow-up film demonstrates persisting contrast material within the stomach. Nasogastric catheter is noted in place. Multiple dilated loops of small bowel are again identified. Contrast is passed into the midportion of the small bowel. No contrast is noted within the colon. 24 hour film is recommended for further follow-up. No free air is seen. Stable appearance of aortic stent graft. IMPRESSION: Contrast is now passed in the midportion of the small bowel. 24 hour follow-up film is recommended. Persistent small bowel dilatation similar to that seen on the prior exam. Electronically Signed   By: Inez Catalina M.D.   On: 05/02/2017 07:37   Dg Abd Portable 1v  Result Date: 05/01/2017 CLINICAL DATA:  Small bowel obstruction. EXAM: PORTABLE ABDOMEN - 1 VIEW COMPARISON:  CT scan dated 04/30/2017 and abdominal radiograph dated 04/08/2017 FINDINGS: New NG tube in place. Persistent dilatation of small bowel loops in the right and left sides of the abdomen. No significant change in the bowel gas pattern since the prior CT scan. Multiple surgical clips and staples are noted.  Aortic stent graft. No acute bone abnormality. No change in the atelectasis or scarring and small effusion at the left base. IMPRESSION: Persistent pattern of small bowel obstruction, unchanged. Electronically Signed   By: Lorriane Shire M.D.   On: 05/01/2017 07:45    Anti-infectives: Anti-infectives (From admission, onward)   Start     Dose/Rate Route Frequency Ordered Stop   05/02/17 0030  azithromycin (ZITHROMAX) 500 mg in dextrose 5 % 250 mL IVPB     500 mg 250 mL/hr over 60 Minutes Intravenous Daily at bedtime 05/01/17 2351     05/01/17 0000  piperacillin-tazobactam (ZOSYN) IVPB 3.375 g     3.375 g 12.5 mL/hr over 240 Minutes  Intravenous Every 8 hours 04/30/17 2213     04/30/17 1730  piperacillin-tazobactam (ZOSYN) IVPB 3.375 g     3.375 g 100 mL/hr over 30 Minutes Intravenous  Once 04/30/17 1728 04/30/17 1823       Assessment/Plan HTN COPD Chronic systolic CHF  S/P ex-lap with small bowel resection and left femoral hernia repair with mesh - Dr. Brantley Stage 12/3 Post-operative ileus vs SBO - SB protocol - NGT with 350 out - mobilize as able - hydrate with IVF, 75cc/h will defer to medicine if rate needs to be changed given CHF  FEN: NPO, IVF; NGT to LIWS VTE: SCDs, SQ heparin ID: IV zosyn 12/25>>  Plan: will get 24hr delay films tomorrow AM. If no progression of contrast will need to discuss exploration vs more time decompressing.    LOS: 2 days    Clovis Riley , [facility] 05/02/2017, 8:29 AM

## 2017-05-02 NOTE — Progress Notes (Signed)
Patient NG tube was pulled out. Will remain out for now per verbal order. Patient currently resting comfortably. Will continue to monitor status.

## 2017-05-02 NOTE — Progress Notes (Signed)
Progress Note    Tommy Turner  CBJ:[id number] DOB: February 23, 1941  DOA: 04/30/2017 PCP: [name], MD    Brief Narrative:   Chief complaint: F/U partial SBO  Medical records reviewed and are as summarized below:  FINLEE MILO is an 76 y.o. male with a PMH of chronic systolic CHF, EF 16%, chronic respiratory failure on home O2 3 L, COPD, recent surgery for incarcerated inguinal hernia on 04/08/17 (Dr. Brantley Stage), was discharged from the hospital on 04/11/17, subsequently readmitted 04/30/17 with a partial small bowel obstruction and possible left inguinal abscess. Surgery was consulted, patient was transferred from [facility] to [facility].  Assessment/Plan:   Principal Problem:   Partial small bowel obstruction [facility]) Surgery following. Continue NG tube decompression, bowel rest, nothing by mouth. Further imaging planned for tomorrow. If no improvement, will need possible surgical exploration. Patient pulled out NGT so will need to be replaced unless surgery feels it isn't necessary. Abdomen soft, denies abdominal pain.  No N/V since this a.m. Repeat abdominal films show persistent bowel distention but contrast has moved into the small bowel. Repeat imaging ordered for the morning.  Active Problems:   Delirium Seems a bit confused on exam and pulled his NG tube out. We'll check an ABG as the patient's wife reports that he has a history of altered mental status with high carbon dioxide levels.    Essential hypertension Blood pressures are soft. No antihypertensives at this time.    Chronic respiratory failure with hypoxia and hypercapnia (HCC)/COPD Continue supplemental oxygen and bronchodilators. Had increased oxygen demands overnight and required when necessary nebs. Repeat chest x-ray showed increased pleural effusion and possible new opacity. Placed on a Azithro in addition to Meigs.    Systolic CHF, chronic (HCC) EF 20%. Diuretics currently on hold, but may need to consider  resuming given his episode of increased oxygen requirement overnight.    Inguinal abscess Has been on Zosyn since admission.  Body mass index is 20.34 kg/m.   Family Communication/Anticipated D/C date and plan/Code Status   DVT prophylaxis: Heparin ordered. Code Status: DO NOT RESUSCITATE  Family Communication: No family at the bedside. Wife updated by telephone. Disposition Plan: Home versus SNF depending on progress.   Medical Consultants:    Surgery   Anti-Infectives:    Zosyn 04/30/17--->  Azithromycin 05/02/17--->  Subjective:   Denies abdominal pain, shortness of breath.  Had N/V this morning. No flatus. Spoke with patient's wife by telephone and she informs me that he has not been himself today. She says that he can get disoriented when his carbon dioxide levels are too high.  Objective:    Vitals:   05/01/17 0500 05/01/17 1331 05/01/17 2228 05/02/17 0543  BP: (!) 96/40 102/60 107/63 109/71  Pulse: 81 97 95 86  Resp: 17 18 19 17   Temp: 98.1 F (36.7 C) (!) 97.5 F (36.4 C) 97.9 F (36.6 C) 98.4 F (36.9 C)  TempSrc: Oral Oral Oral Oral  SpO2: 93% 92% (!) 85% 96%  Weight:      Height:        Intake/Output Summary (Last 24 hours) at 05/02/2017 0842 Last data filed at 05/02/2017 0737 Gross per 24 hour  Intake 670 ml  Output 400 ml  Net 270 ml   Filed Weights   04/30/17 1434 04/30/17 2118  Weight: 56.7 kg (125 lb) 58.9 kg (129 lb 13.6 oz)    Exam: General: Chronically ill appearing male who is mildly confused. Cardiovascular:  Heart sounds show a regular rate, and rhythm. No gallops or rubs. No murmurs. No JVD. Lungs: Clear to auscultation bilaterally with good air movement. No rales, rhonchi or wheezes. Abdomen: Soft, mildly tender to palpation in the suprapubic area with mild distention, decreased bowel sounds. No masses. No hepatosplenomegaly. Neurological: Disoriented. Moves all extremities 4 with equal strength. Cranial nerves II through  XII grossly intact. Skin: Warm and dry. No rashes or lesions. Surgical incision left groin clean and dry. Extremities: No clubbing or cyanosis. No edema. Pedal pulses 2+. Psychiatric: Mood and affect are mildly anxious. Insight and judgment are impaired.   Data Reviewed:   I have personally reviewed following labs and imaging studies:  Labs: Labs show the following:   Basic Metabolic Panel: Recent Labs  Lab 04/30/17 1459 04/30/17 2225 05/01/17 0504 05/02/17 0627  NA 139  --  140 141  K 4.3  --  4.2 4.1  CL 96*  --  99* 97*  CO2 35*  --  37* 37*  GLUCOSE 123*  --  107* 228*  BUN 17  --  14 15  CREATININE 0.65 0.58* 0.60* 0.74  CALCIUM 8.6*  --  8.6* 8.8*   GFR Estimated Creatinine Clearance: 64.4 mL/min (by C-G formula based on SCr of 0.74 mg/dL). Liver Function Tests: Recent Labs  Lab 05/01/17 0504  AST 13*  ALT 10*  ALKPHOS 61  BILITOT 0.8  PROT 5.7*  ALBUMIN 2.5*   Coagulation profile Recent Labs  Lab 05/01/17 0504  INR 1.09    CBC: Recent Labs  Lab 04/30/17 1459 04/30/17 2225 05/01/17 0504 05/02/17 0627  WBC 10.6* 8.7 10.5 11.3*  NEUTROABS 8.6*  --   --   --   HGB 11.8* 10.3* 11.4* 11.8*  HCT 40.4 34.3* 38.5* 39.0  MCV 98.8 97.7 98.0 96.5  PLT 252 201 205 225   Sepsis Labs: Recent Labs  Lab 04/30/17 1459 04/30/17 1525 04/30/17 2225 05/01/17 0504 05/02/17 0627  WBC 10.6*  --  8.7 10.5 11.3*  LATICACIDVEN  --  0.85  --   --   --     Microbiology No results found for this or any previous visit (from the past 240 hour(s)).  Procedures and diagnostic studies:  Ct Abdomen Pelvis W Contrast 04/30/2017:  IMPRESSION: 1. Small fluid collection in the left inguinal subcutaneous soft tissues below the incision staples, which appears to communicate with a small fluid collection with associated bubbles of air in the left anterior low pelvis just below the fascia. This latter collection measures 3.9 cm in greatest dimension. An abscess is suspected.  2. Dilated proximal to mid small bowel with decompressed distal small bowel, findings consistent with a partial small bowel obstruction. The exact transition point is not defined. 3. Small ascites. 4. Small left pleural effusion increased in size from the previous CT. Associated lung base opacity that is most likely all atelectasis. Electronically Signed   By: Lajean Manes M.D.   On: 04/30/2017 16:49   Dg Chest Port 1 View 05/01/2017 IMPRESSION: 1. Cardiomegaly with probable tiny effusions. Persistent vascular congestion 2. Probable small pleural effusions. Slightly improved aeration at the left lung base but increasing infiltrate in the left upper lung and left perihilar region. Electronically Signed   By: Donavan Foil M.D.   On: 05/01/2017 23:31   Dg Chest Portable 1 View: 04/30/2017  IMPRESSION: 1. It is difficult to visualize the distal aspect of the nasogastric tube behind the heart. End abdominal flow may help  to confirm tip positioning. 2. Increase in pulmonary vascular prominence. A component of pulmonary edema cannot be excluded. 3. Atelectasis at the left lung base with probable small left pleural effusion. Electronically Signed   By: Aletta Edouard M.D.   On: 04/30/2017 18:25   Dg Abd Portable 1v-small Bowel Obstruction Protocol-initial, 8 Hr Delay 05/02/2017 IMPRESSION: Contrast is now passed in the midportion of the small bowel. 24 hour follow-up film is recommended. Persistent small bowel dilatation similar to that seen on the prior exam. Electronically Signed   By: Inez Catalina M.D.   On: 05/02/2017 07:37    Dg Abd Po  *My independent review of the image shows: When compared to films done 03/01/17, contrast has now moved into the bowel in the center of the film. Bowel distention persists however.   Medications:   . feeding supplement (ENSURE ENLIVE)  237 mL Oral BID BM  . heparin  5,000 Units Subcutaneous Q8H  . sodium chloride flush  3 mL Intravenous Q12H   Continuous  Infusions: . sodium chloride 250 mL (05/01/17 0700)  . azithromycin Stopped (05/02/17 4975)  . dextrose 5 % and 0.45 % NaCl with KCl 20 mEq/L 75 mL/hr at 05/01/17 1432  . piperacillin-tazobactam (ZOSYN)  IV Stopped (05/02/17 0330)     LOS: 2 days   Jacquelynn Cree  [facility] Pager [phone]. If unable to reach me by pager, please call my cell phone at [phone].  *Please refer to [email or web address], password [email or web address] to get updated schedule on who will round on this patient, as hospitalists switch teams weekly. If 7PM-7AM, please contact night-coverage at [email or web address], password [email or web address] for any overnight needs.  05/02/2017, 8:42 AM

## 2017-05-03 ENCOUNTER — Inpatient Hospital Stay (HOSPITAL_COMMUNITY): Payer: 59

## 2017-05-03 DIAGNOSIS — J431 Panlobular emphysema: Secondary | ICD-10-CM

## 2017-05-03 LAB — BASIC METABOLIC PANEL
ANION GAP: 9 (ref 5–15)
BUN: 17 mg/dL (ref 6–20)
CALCIUM: 8.5 mg/dL — AB (ref 8.9–10.3)
CO2: 37 mmol/L — ABNORMAL HIGH (ref 22–32)
Chloride: 95 mmol/L — ABNORMAL LOW (ref 101–111)
Creatinine, Ser: 0.6 mg/dL — ABNORMAL LOW (ref 0.61–1.24)
Glucose, Bld: 119 mg/dL — ABNORMAL HIGH (ref 65–99)
Potassium: 3.9 mmol/L (ref 3.5–5.1)
Sodium: 141 mmol/L (ref 135–145)

## 2017-05-03 LAB — CBC
HCT: 36.5 % — ABNORMAL LOW (ref 39.0–52.0)
Hemoglobin: 11 g/dL — ABNORMAL LOW (ref 13.0–17.0)
MCH: 29.4 pg (ref 26.0–34.0)
MCHC: 30.1 g/dL (ref 30.0–36.0)
MCV: 97.6 fL (ref 78.0–100.0)
PLATELETS: 190 10*3/uL (ref 150–400)
RBC: 3.74 MIL/uL — ABNORMAL LOW (ref 4.22–5.81)
RDW: 13.7 % (ref 11.5–15.5)
WBC: 10.2 10*3/uL (ref 4.0–10.5)

## 2017-05-03 MED ORDER — IPRATROPIUM-ALBUTEROL 0.5-2.5 (3) MG/3ML IN SOLN
3.0000 mL | RESPIRATORY_TRACT | Status: DC
  Administered 2017-05-03: 3 mL via RESPIRATORY_TRACT
  Filled 2017-05-03: qty 3

## 2017-05-03 MED ORDER — ORAL CARE MOUTH RINSE
15.0000 mL | Freq: Two times a day (BID) | OROMUCOSAL | Status: DC
  Administered 2017-05-03 – 2017-05-10 (×12): 15 mL via OROMUCOSAL

## 2017-05-03 MED ORDER — LACTATED RINGERS IV BOLUS (SEPSIS)
500.0000 mL | Freq: Once | INTRAVENOUS | Status: AC
  Administered 2017-05-03: 500 mL via INTRAVENOUS

## 2017-05-03 MED ORDER — IPRATROPIUM-ALBUTEROL 0.5-2.5 (3) MG/3ML IN SOLN
3.0000 mL | Freq: Three times a day (TID) | RESPIRATORY_TRACT | Status: DC
  Administered 2017-05-03 – 2017-05-04 (×2): 3 mL via RESPIRATORY_TRACT
  Filled 2017-05-03 (×2): qty 3

## 2017-05-03 NOTE — Progress Notes (Signed)
Initial Nutrition Assessment  DOCUMENTATION CODES:   Severe malnutrition in context of chronic illness  INTERVENTION:   -RD will follow for diet advancement and supplement as appropriate  NUTRITION DIAGNOSIS:   Severe Malnutrition related to chronic illness(COPD) as evidenced by energy intake < or equal to 50% for > or equal to 1 month, severe fat depletion, severe muscle depletion.  GOAL:   Patient will meet greater than or equal to 90% of their needs  MONITOR:   Diet advancement, Labs, Weight trends, Skin, I & O's  REASON FOR ASSESSMENT:   Malnutrition Screening Tool    ASSESSMENT:   Tommy Turner is a 76 y.o. male with medical history significant of chronic systolic congestive heart failure with ejection fraction of 20%, COPD on home oxygen at 3 L, recent surgery for incarcerated inguinal hernia.  He was discharged from the hospital on 12/6 and reported that he was doing fairly well.  Approximately 2 days ago he began to experience nausea.  That was the last time he had a bowel movement.  Pt admitted with partial small bowel obstruction.   12/27- NGT fell out and reinserted (650 ml output within past 24 hours)  Spoke with pt and wife at bedside. Pt reports feeling better, but complains of intermittent nausea despite NGT being in. Per pt wife, pt eats very little at baseline. He consumes 3 meals per day, but portions are small; he "eats whatever he wants" and frequently consumed foods include oatmeal and eggs. Pt wife shares that pt has lost about 20-25# over the past 6 months. However, this is not consistent with wt hx (wt has been stable over the past year). Pt has not experienced any decline in mobility- typically walks with a cane, but has a walker if he needs it.   Pt and wife understanding of NPO order and NGT. They deny any further questions, however, expressed appreciation of visit.   Labs reviewed.   NUTRITION - FOCUSED PHYSICAL EXAM:    Most Recent Value   Orbital Region  Severe depletion  Upper Arm Region  Severe depletion  Thoracic and Lumbar Region  Severe depletion  Buccal Region  Severe depletion  Temple Region  Severe depletion  Clavicle Bone Region  Severe depletion  Clavicle and Acromion Bone Region  Severe depletion  Scapular Bone Region  Severe depletion  Dorsal Hand  Severe depletion  Patellar Region  Severe depletion  Anterior Thigh Region  Severe depletion  Posterior Calf Region  Severe depletion  Edema (RD Assessment)  None  Hair  Reviewed  Eyes  Reviewed  Mouth  Reviewed  Skin  Reviewed  Nails  Reviewed       Diet Order:  Diet NPO time specified Except for: Ice Chips  EDUCATION NEEDS:   No education needs have been identified at this time  Skin:  Skin Assessment: Skin Integrity Issues: Skin Integrity Issues:: Incisions Incisions: closed lt groin and abdomen  Last BM:  04/30/17  Height:   Ht Readings from Last 1 Encounters:  04/30/17 5\' 7"  (1.702 m)    Weight:   Wt Readings from Last 1 Encounters:  04/30/17 129 lb 13.6 oz (58.9 kg)    Ideal Body Weight:  67.3 kg  BMI:  Body mass index is 20.34 kg/m.  Estimated Nutritional Needs:   Kcal:  5732-2025  Protein:  85-100 grams  Fluid:  >1.7 L    Cattleya Dobratz A. Jimmye Norman, RD, LDN, CDE Pager: [phone] After hours Pager: [phone]

## 2017-05-03 NOTE — Progress Notes (Signed)
CC:  Abdominal pain, nausea and vomiting  Subjective: Feels better with NG in.  Still distended and no flatus or BS.  Lower hernia incision looks fine, no palpable fluid, no erythema, still has a couple staples in place.  Objective: Vital signs in last 24 hours: Temp:  [97.8 F (36.6 C)-98.2 F (36.8 C)] 97.9 F (36.6 C) (12/28 0514) Pulse Rate:  [76-93] 76 (12/28 0514) Resp:  [16-24] 16 (12/28 0514) BP: (101-126)/(60-94) 105/60 (12/28 0514) SpO2:  [89 %-96 %] 96 % (12/28 0514) Last BM Date: 04/30/17 NPO Pulled NG yesterday then vomited NG back in with 650 out last PM Afebrile, BP up ~ 11:00 AM, otherwise normal   Labs OK Film this AM:  Grossly unchanged moderate to marked patulous distension of multiple loops of small bowel with index loop of small bowel in the left mid hemiabdomen measuring approximately 5.1 cm in diameter and containing a small amount of residual enteric contrast. This finding is again associated with a conspicuous paucity of distal colonic gas. Enteric tube tip and side port projected the expected location of the gastric fundus.  No supine evidence of pneumoperitoneum. No definite pneumatosis or portal venous gas    Intake/Output from previous day: 12/27 0701 - 12/28 0700 In: 1875 [I.V.:1875] Out: 650 [Emesis/NG output:650] Intake/Output this shift: No intake/output data recorded.  General appearance: alert, cooperative, no distress and frail and chronically ill appearing Resp: clear to auscultation bilaterally GI: he is distended, no BS, he has about 750 in the NG cannister that is from last PM. A couple staples lower incision, no erythema or cellulitis any incision.    Lab Results:  Recent Labs    05/02/17 0627 05/03/17 0537  WBC 11.3* 10.2  HGB 11.8* 11.0*  HCT 39.0 36.5*  PLT 225 190    BMET Recent Labs    05/02/17 0627 05/03/17 0537  NA 141 141  K 4.1 3.9  CL 97* 95*  CO2 37* 37*  GLUCOSE 228* 119*  BUN 15 17  CREATININE  0.74 0.60*  CALCIUM 8.8* 8.5*   PT/INR Recent Labs    05/01/17 0504  LABPROT 14.0  INR 1.09    Recent Labs  Lab 05/01/17 0504  AST 13*  ALT 10*  ALKPHOS 61  BILITOT 0.8  PROT 5.7*  ALBUMIN 2.5*     Lipase     Component Value Date/Time   LIPASE 24 04/05/2017 2342     Medications: . feeding supplement (ENSURE ENLIVE)  237 mL Oral BID BM  . heparin  5,000 Units Subcutaneous Q8H  . sodium chloride flush  3 mL Intravenous Q12H   . sodium chloride 250 mL (05/01/17 0700)  . azithromycin Stopped (05/02/17 2322)  . dextrose 5 % and 0.45 % NaCl with KCl 20 mEq/L 75 mL/hr at 05/02/17 1433  . piperacillin-tazobactam (ZOSYN)  IV Stopped (05/03/17 0500)   Anti-infectives (From admission, onward)   Start     Dose/Rate Route Frequency Ordered Stop   05/02/17 0030  azithromycin (ZITHROMAX) 500 mg in dextrose 5 % 250 mL IVPB     500 mg 250 mL/hr over 60 Minutes Intravenous Daily at bedtime 05/01/17 2351     05/01/17 0000  piperacillin-tazobactam (ZOSYN) IVPB 3.375 g     3.375 g 12.5 mL/hr over 240 Minutes Intravenous Every 8 hours 04/30/17 2213     04/30/17 1730  piperacillin-tazobactam (ZOSYN) IVPB 3.375 g     3.375 g 100 mL/hr over 30 Minutes Intravenous  Once 04/30/17  1728 04/30/17 1823      Assessment/Plan S/P ex-lap with small bowel resection and left femoral hernia repair with mesh - Dr. Brantley Stage 12/3 Post-operative ileus vs SBO -  3.0 x 2.5 x 3.2 cm fluid collection/possible abscess  on CT 04/30/17 - SB protocol, with no contrast to colon 12/27 or 12/28  - NGT pulled by pt 12/27 - vomited last PM and replaced 650 ml recorded - mobilize as able - hydrate with IVF, will put at 75cc/h will defer to medicine if rate needs to be changed given CHF  HTN COPD - on O2 Chronic systolic CHF - EF 40%  FEN: NPO, IVF; NGT to LIWS VTE: SCDs, SQ heparin ID: IV zosyn 12/25>> day 4 azithromycin 12/26 =>> day 3 Foley:  None Follow up: [facility]   Plan:  Pull remaining staples,  bowel rest, ambulate, continue antibiotics.  No sign of infection, but will discuss repeating CT scan some time in the future.      LOS: 3 days    Tommy Turner 05/03/[id number]

## 2017-05-03 NOTE — Progress Notes (Signed)
  PROGRESS NOTE  Tommy Turner JEH:[id number] DOB: 10-Nov-1940 DOA: 04/30/2017 PCP: [name], MD  Brief Narrative: 76yom presented with abd pain. PMH recent small bowel resection and hernia repair. [facility]red [facility] to [facility].  Assessment/Plan SBO. Status post repair of a left femoral hernia with mesh as well as an exploratory laparotomy and small bowel resection 12/3. Surgery feels possible left inguinal abscess is a hematoma--further management deferred to surgery. - management per surgery; thus far conservative.  Delirium - appears resolved at this point. ABG noted, follow clinically.  Abnormal CXR - no pulm symptoms, monitor for aspiration or development of pneumonia given SBO and left infiltrate. Continue Zosyn.  COPD on home oxygen on 3L; chronic hypoxic resp failure - ABG noted. Appears stable, awake alert. Follow clinically  Chronic systolic CHF, LVEF 37% 85/8850 - appears compensated. Follow I/O daily.  DVT prophylaxis: heparin d/c >> SCDs for now Code Status: DNR Family Communication: family at bedside Disposition Plan: home    [name], MD  [facility] Direct contact: [phone] --[address] [place]  --[email or web address]; password [email or web address]  7PM-7AM contact night coverage as above 05/03/2017, 1:16 PM  LOS: 3 days   Consultants:  General surgery  Procedures:    Antimicrobials:  Zosyn 12/25 >>  Azithromycin 12/26 >>  Interval history/Subjective: Feels ok today, some soreness, no vomiting.  Objective: Vitals:  Vitals:   05/02/17 2042 05/03/17 0514  BP: 101/76 105/60  Pulse: 93 76  Resp: 20 16  Temp: 98.2 F (36.8 C) 97.9 F (36.6 C)  SpO2: 95% 96%    Exam:  Constitutional:  . Appears calm and comfortable Eyes:  . pupils and irises appear normal . Normal lids ENMT:  . grossly normal hearing  Respiratory:  . CTA bilaterally, no w/r/r.  . Respiratory effort normal.  Cardiovascular:  . RRR, no m/r/g . No LE extremity edema     Skin:  . No rashes, lesions, ulcers noted Psychiatric:  . Mental status o Mood, affect appropriate . judgement and insight appear normal    I have personally reviewed the following:  Filed Weights   04/30/17 1434 04/30/17 2118  Weight: 56.7 kg (125 lb) 58.9 kg (129 lb 13.6 oz)   Weight change:   Labs:  BMP unremarkable  CBC stable, normal WBC  ABG 7.44/59/88  Imaging studies:  AXR SBO  Scheduled Meds: . ipratropium-albuterol  3 mL Nebulization Q4H  . sodium chloride flush  3 mL Intravenous Q12H   Continuous Infusions: . sodium chloride 250 mL (05/01/17 0700)  . dextrose 5 % and 0.45 % NaCl with KCl 20 mEq/L 75 mL/hr at 05/03/17 0900  . lactated ringers    . piperacillin-tazobactam (ZOSYN)  IV 3.375 g (05/03/17 0900)    Principal Problem:   SBO (small bowel obstruction) (HCC) Active Problems:   Essential hypertension   COPD (chronic obstructive pulmonary disease) (HCC)   Chronic respiratory failure with hypoxia and hypercapnia (HCC)   Systolic CHF, chronic ([place])   LOS: 3 days

## 2017-05-03 NOTE — Progress Notes (Signed)
Pt.nauseated and vomited 300 cc.Vomitus greenish with rotten smell.

## 2017-05-04 LAB — BASIC METABOLIC PANEL
Anion gap: 8 (ref 5–15)
BUN: 8 mg/dL (ref 6–20)
CO2: 36 mmol/L — ABNORMAL HIGH (ref 22–32)
CREATININE: 0.5 mg/dL — AB (ref 0.61–1.24)
Calcium: 8.4 mg/dL — ABNORMAL LOW (ref 8.9–10.3)
Chloride: 96 mmol/L — ABNORMAL LOW (ref 101–111)
GFR calc non Af Amer: >60 mL/min (ref >60)
Glucose, Bld: 104 mg/dL — ABNORMAL HIGH (ref 65–99)
Potassium: 3.6 mmol/L (ref 3.5–5.1)
SODIUM: 140 mmol/L (ref 135–145)

## 2017-05-04 LAB — CBC
HCT: 36.6 % — ABNORMAL LOW (ref 39.0–52.0)
HEMOGLOBIN: 10.9 g/dL — AB (ref 13.0–17.0)
MCH: 29 pg (ref 26.0–34.0)
MCHC: 29.8 g/dL — AB (ref 30.0–36.0)
MCV: 97.3 fL (ref 78.0–100.0)
PLATELETS: 180 10*3/uL (ref 150–400)
RBC: 3.76 MIL/uL — AB (ref 4.22–5.81)
RDW: 13.7 % (ref 11.5–15.5)
WBC: 7.7 10*3/uL (ref 4.0–10.5)

## 2017-05-04 MED ORDER — FUROSEMIDE 10 MG/ML IJ SOLN
20.0000 mg | Freq: Every day | INTRAMUSCULAR | Status: DC
  Administered 2017-05-04 – 2017-05-11 (×8): 20 mg via INTRAVENOUS
  Filled 2017-05-04 (×8): qty 2

## 2017-05-04 MED ORDER — ENOXAPARIN SODIUM 40 MG/0.4ML ~~LOC~~ SOLN
40.0000 mg | SUBCUTANEOUS | Status: DC
  Administered 2017-05-04 – 2017-05-10 (×7): 40 mg via SUBCUTANEOUS
  Filled 2017-05-04 (×7): qty 0.4

## 2017-05-04 MED ORDER — PANTOPRAZOLE SODIUM 40 MG IV SOLR
40.0000 mg | INTRAVENOUS | Status: DC
  Administered 2017-05-04 – 2017-05-09 (×6): 40 mg via INTRAVENOUS
  Filled 2017-05-04 (×6): qty 40

## 2017-05-04 NOTE — Progress Notes (Signed)
CC: Abdominal pain  Subjective: No real change from yesterday.  He is not uncomfortable but he still has mild distention minimal bowel sounds.  No flatus or BM.  NG is in place and working.  Objective: Vital signs in last 24 hours: Temp:  [97.5 F (36.4 C)-97.9 F (36.6 C)] 97.9 F (36.6 C) (12/29 0430) Pulse Rate:  [62-71] 62 (12/29 0430) Resp:  [13-18] 18 (12/29 0430) BP: (94-101)/(63-67) 101/63 (12/29 0430) SpO2:  [91 %-97 %] 96 % (12/29 0911) Last BM Date: 04/30/17 1992 IV 300 urine 1000 NG No BM recorded Afebrile, VSS  On Nasal O2 Labs stable  Intake/Output from previous day: 12/28 0701 - 12/29 0700 In: 1992.5 [I.V.:1762.5; NG/GT:30; IV Piggyback:200] Out: 1300 [Urine:300; Emesis/NG output:1000] Intake/Output this shift: No intake/output data recorded.  General appearance: alert, cooperative and no distress Resp: clear to auscultation bilaterally and He has a thick sputum that he has a hard time coughing up we will add a flutter valve. GI: Mildly distended not really tender, few bowel sounds but hypoactive.  No flatus or BM.  Lab Results:  Recent Labs    05/03/17 0537 05/04/17 0646  WBC 10.2 7.7  HGB 11.0* 10.9*  HCT 36.5* 36.6*  PLT 190 180    BMET Recent Labs    05/03/17 0537 05/04/17 0646  NA 141 140  K 3.9 3.6  CL 95* 96*  CO2 37* 36*  GLUCOSE 119* 104*  BUN 17 8  CREATININE 0.60* 0.50*  CALCIUM 8.5* 8.4*   PT/INR No results for input(s): LABPROT, INR in the last 72 hours.  Recent Labs  Lab 05/01/17 0504  AST 13*  ALT 10*  ALKPHOS 61  BILITOT 0.8  PROT 5.7*  ALBUMIN 2.5*     Lipase     Component Value Date/Time   LIPASE 24 04/05/2017 2342     Medications: . ipratropium-albuterol  3 mL Nebulization TID  . mouth rinse  15 mL Mouth Rinse BID  . sodium chloride flush  3 mL Intravenous Q12H   . sodium chloride 250 mL (05/01/17 0700)  . dextrose 5 % and 0.45 % NaCl with KCl 20 mEq/L 75 mL/hr at 05/03/17 2243  .  piperacillin-tazobactam (ZOSYN)  IV 3.375 g (05/04/17 0803)   Anti-infectives (From admission, onward)   Start     Dose/Rate Route Frequency Ordered Stop   05/02/17 0030  azithromycin (ZITHROMAX) 500 mg in dextrose 5 % 250 mL IVPB  Status:  Discontinued     500 mg 250 mL/hr over 60 Minutes Intravenous Daily at bedtime 05/01/17 2351 05/03/17 1315   05/01/17 0000  piperacillin-tazobactam (ZOSYN) IVPB 3.375 g     3.375 g 12.5 mL/hr over 240 Minutes Intravenous Every 8 hours 04/30/17 2213     04/30/17 1730  piperacillin-tazobactam (ZOSYN) IVPB 3.375 g     3.375 g 100 mL/hr over 30 Minutes Intravenous  Once 04/30/17 1728 04/30/17 1823      Assessment/Plan S/P ex-lap with small bowel resection and left femoral hernia repair with mesh - Dr. Brantley Stage 04/08/17 Post-operative ileus vs SBO -  3.0 x 2.5 x 3.2 cm fluid collection/possible abscess  on CT 04/30/17 - SB protocol, with no contrast to colon 12/27 or 12/28  - NGT pulled by pt 12/27 - vomited last PM and replaced 650 ml recorded - mobilize as able - hydrate with IVF, will put at 75cc/h will defer to medicine if rate needs to be changed given CHF  HTN COPD - on O2  Chronic systolic CHF - EF 34%  FEN: NPO, IVF; NGT to LIWS VTE: SCDs, SQ heparin stopped 12/28 by Dr. Sarajane Jews ID: IV zosyn 12/25>> day 5    azithromycin 12/26 -05/03/17 Foley:  None Follow up: [facility]   Plan: No real improvement.  He had a film yesterday.  Will review with Dr. Dema Severin.     LOS: 4 days    Atha Muradyan 05/04/2017 [phone]

## 2017-05-04 NOTE — Progress Notes (Signed)
  PROGRESS NOTE  Tommy Turner JHE:[id number] DOB: June 28, 1940 DOA: 04/30/2017 PCP: [name], MD  Brief Narrative: 76yom presented with abd pain. PMH recent small bowel resection and hernia repair. Transferred [facility] to [facility].  Assessment/Plan SBO. Status post repair of a left femoral hernia with mesh as well as an exploratory laparotomy and small bowel resection 12/3. Surgery feels possible left inguinal abscess is a hematoma--further management deferred to surgery. - continue management per surgery  Delirium - resolved. Follow clinically.  Abnormal CXR - no pulm symptoms, monitor for aspiration or development of pneumonia given SBO and left infiltrate. Finish Zosyn today.  COPD on home oxygen on 3L; chronic hypoxic resp failure - appears stable.  Chronic systolic CHF, LVEF 18% 56/3149 - +2 L but appears stable. Will start daily Lasix.   DVT prophylaxis: heparin d/c >> SCDs for now Code Status: DNR Family Communication: family at bedside Disposition Plan: home    [name], MD  [facility] Direct contact: [phone] --[address] [place]  --[email or web address]; password [email or web address]  7PM-7AM contact night coverage as above 05/04/2017, 10:36 AM  LOS: 4 days   Consultants:  General surgery  Procedures:    Antimicrobials:  Zosyn 12/25 >>  Azithromycin 12/26 >> 12/28  Interval history/Subjective: Feels about the same. Some vomiting of phlegm earlier. Breathing ok. No CP.  Objective: Vitals:  Vitals:   05/04/17 0430 05/04/17 0911  BP: 101/63   Pulse: 62   Resp: 18   Temp: 97.9 F (36.6 C)   SpO2: 95% 96%    Exam:  Constitutional:   . Appears calm, mildly uncomfortable Respiratory:  . CTA bilaterally, no w/r/r.  . Respiratory effort normal. Cardiovascular:  . RRR, no m/r/g . No LE extremity edema   Psychiatric:  . Mental status o Mood, affect appropriate   I have personally reviewed the following:  Filed Weights   04/30/17 1434 04/30/17  2118  Weight: 56.7 kg (125 lb) 58.9 kg (129 lb 13.6 oz)   Weight change:   I/O +2.1 L Labs:  BMP, CBC stable  Imaging studies:    Scheduled Meds: . ipratropium-albuterol  3 mL Nebulization TID  . mouth rinse  15 mL Mouth Rinse BID  . sodium chloride flush  3 mL Intravenous Q12H   Continuous Infusions: . sodium chloride 250 mL (05/01/17 0700)  . dextrose 5 % and 0.45 % NaCl with KCl 20 mEq/L 75 mL/hr at 05/03/17 2243  . piperacillin-tazobactam (ZOSYN)  IV 3.375 g (05/04/17 0803)    Principal Problem:   SBO (small bowel obstruction) (HCC) Active Problems:   Essential hypertension   COPD (chronic obstructive pulmonary disease) (HCC)   Chronic respiratory failure with hypoxia and hypercapnia (HCC)   Systolic CHF, chronic ([place])   LOS: 4 days

## 2017-05-05 LAB — BASIC METABOLIC PANEL
ANION GAP: 2 — AB (ref 5–15)
BUN: 6 mg/dL (ref 6–20)
CALCIUM: 8.2 mg/dL — AB (ref 8.9–10.3)
CO2: 37 mmol/L — ABNORMAL HIGH (ref 22–32)
Chloride: 100 mmol/L — ABNORMAL LOW (ref 101–111)
Creatinine, Ser: 0.56 mg/dL — ABNORMAL LOW (ref 0.61–1.24)
GLUCOSE: 92 mg/dL (ref 65–99)
POTASSIUM: 3.6 mmol/L (ref 3.5–5.1)
SODIUM: 139 mmol/L (ref 135–145)

## 2017-05-05 LAB — GLUCOSE, CAPILLARY
GLUCOSE-CAPILLARY: 83 mg/dL (ref 65–99)
GLUCOSE-CAPILLARY: 138 mg/dL — AB (ref 65–99)

## 2017-05-05 LAB — PHOSPHORUS: PHOSPHORUS: 2.3 mg/dL — AB (ref 2.5–4.6)

## 2017-05-05 LAB — MAGNESIUM: MAGNESIUM: 1.9 mg/dL (ref 1.7–2.4)

## 2017-05-05 MED ORDER — MAGNESIUM SULFATE IN D5W 1-5 GM/100ML-% IV SOLN
1.0000 g | Freq: Once | INTRAVENOUS | Status: AC
  Administered 2017-05-05: 1 g via INTRAVENOUS
  Filled 2017-05-05: qty 100

## 2017-05-05 MED ORDER — INSULIN ASPART 100 UNIT/ML ~~LOC~~ SOLN
0.0000 [IU] | Freq: Four times a day (QID) | SUBCUTANEOUS | Status: DC
  Administered 2017-05-06 – 2017-05-09 (×11): 1 [IU] via SUBCUTANEOUS

## 2017-05-05 MED ORDER — KCL IN DEXTROSE-NACL 20-5-0.45 MEQ/L-%-% IV SOLN
INTRAVENOUS | Status: AC
  Administered 2017-05-05: 19:00:00 via INTRAVENOUS
  Filled 2017-05-05: qty 1000

## 2017-05-05 MED ORDER — TRACE MINERALS CR-CU-MN-SE-ZN 10-1000-500-60 MCG/ML IV SOLN
INTRAVENOUS | Status: AC
  Administered 2017-05-05: 18:00:00 via INTRAVENOUS
  Filled 2017-05-05 (×2): qty 288

## 2017-05-05 MED ORDER — SODIUM CHLORIDE 0.9% FLUSH
10.0000 mL | INTRAVENOUS | Status: DC | PRN
  Administered 2017-05-07 – 2017-05-10 (×7): 10 mL
  Filled 2017-05-05 (×7): qty 40

## 2017-05-05 MED ORDER — POTASSIUM PHOSPHATES 15 MMOLE/5ML IV SOLN
10.0000 mmol | Freq: Once | INTRAVENOUS | Status: AC
  Administered 2017-05-05: 10 mmol via INTRAVENOUS
  Filled 2017-05-05: qty 3.33

## 2017-05-05 NOTE — Progress Notes (Addendum)
  PROGRESS NOTE  Tommy Turner QZR:[id number] DOB: 07-09-1940 DOA: 04/30/2017 PCP: [name], MD  Brief Narrative: 76yom presented with abd pain. PMH recent small bowel resection and hernia repair. Transferred [facility] to [facility].  Assessment/Plan SBO. Status post repair of a left femoral hernia with mesh as well as an exploratory laparotomy and small bowel resection 12/3. Surgery feels possible left inguinal abscess is a hematoma--further management deferred to surgery. - continue management per surgery, on abx  Abnormal CXR - no pulm symptoms, monitor for aspiration or development of pneumonia given SBO and left infiltrate.   COPD on home oxygen on 3L; chronic hypoxic resp failure - remains stable  Chronic systolic CHF, LVEF 35% 45/6256 - +2 L but appears stable. Continue daily Lasix.   DVT prophylaxis: enoxaparin per surgery Code Status: DNR Family Communication: family at bedside Disposition Plan: home    [name], MD  [facility] Direct contact: [phone] --[address] [place]  --[email or web address]; password [email or web address]  7PM-7AM contact night coverage as above 05/05/2017, 2:39 PM  LOS: 5 days   Consultants:  General surgery  Procedures:    Antimicrobials:  Zosyn 12/25 >>  Azithromycin 12/26 >> 12/28  Interval history/Subjective: No pain other than in abdomen. Breathing ok. No BM or flatus. Surgery plans conservative management, TNA.  Objective: Vitals:  Vitals:   05/04/17 2228 05/05/17 0653  BP: 102/63 100/61  Pulse: 61 66  Resp:  16  Temp: 97.9 F (36.6 C) 97.8 F (36.6 C)  SpO2: 93% 93%    Exam:  Constitutional:   . Appears calm and comfortable Respiratory:  . CTA bilaterally, no w/r/r. Decreased breath sounds  . Respiratory effort normal.  Cardiovascular:  . RRR, no m/r/g Psychiatric:  . Mental status o Mood, affect appropriate  I have personally reviewed the following:  Filed Weights   04/30/17 1434 04/30/17 2118  Weight:  56.7 kg (125 lb) 58.9 kg (129 lb 13.6 oz)   Weight change:    Labs:    Scheduled Meds: . enoxaparin (LOVENOX) injection  40 mg Subcutaneous Q24H  . furosemide  20 mg Intravenous Daily  . insulin aspart  0-9 Units Subcutaneous Q6H  . mouth rinse  15 mL Mouth Rinse BID  . pantoprazole (PROTONIX) IV  40 mg Intravenous Q24H  . sodium chloride flush  3 mL Intravenous Q12H   Continuous Infusions: . sodium chloride 250 mL (05/01/17 0700)  . dextrose 5 % and 0.45 % NaCl with KCl 20 mEq/L 75 mL/hr at 05/05/17 0212  . dextrose 5 % and 0.45 % NaCl with KCl 20 mEq/L    . piperacillin-tazobactam (ZOSYN)  IV Stopped (05/05/17 1317)  . TPN ADULT (ION)      Principal Problem:   SBO (small bowel obstruction) (HCC) Active Problems:   Essential hypertension   COPD (chronic obstructive pulmonary disease) (HCC)   Chronic respiratory failure with hypoxia and hypercapnia (HCC)   Systolic CHF, chronic ([place])   LOS: 5 days

## 2017-05-05 NOTE — Progress Notes (Signed)
Peripherally Inserted Central Catheter/Midline Placement  The IV Nurse has discussed with the patient and/or persons authorized to consent for the patient, the purpose of this procedure and the potential benefits and risks involved with this procedure.  The benefits include less needle sticks, lab draws from the catheter, and the patient may be discharged home with the catheter. Risks include, but not limited to, infection, bleeding, blood clot (thrombus formation), and puncture of an artery; nerve damage and irregular heartbeat and possibility to perform a PICC exchange if needed/ordered by physician.  Alternatives to this procedure were also discussed.  Bard Power PICC patient education guide, fact sheet on infection prevention and patient information card has been provided to patient /or left at bedside.    PICC/Midline Placement Documentation  PICC Double Lumen 93/90/30 PICC Right Basilic 37 cm 0 cm (Active)  Indication for Insertion or Continuance of Line Administration of hyperosmolar/irritating solutions (i.e. TPN, Vancomycin, etc.) 05/05/2017  1:19 PM  Exposed Catheter (cm) 0 cm 05/05/2017  1:19 PM  Site Assessment Clean;Dry;Intact 05/05/2017  1:19 PM  Lumen #1 Status Flushed;Saline locked;Blood return noted 05/05/2017  1:19 PM  Lumen #2 Status Flushed;Saline locked;Blood return noted 05/05/2017  1:19 PM  Dressing Type Transparent 05/05/2017  1:19 PM  Dressing Status Clean;Dry;Intact;Antimicrobial disc in place 05/05/2017  1:19 PM  Line Care Connections checked and tightened 05/05/2017  1:19 PM  Line Adjustment (NICU/IV Team Only) No 05/05/2017  1:19 PM  Dressing Intervention New dressing 05/05/2017  1:19 PM  Dressing Change Due 05/12/17 05/05/2017  1:19 PM       Rolena Infante 05/05/2017, 1:20 PM

## 2017-05-05 NOTE — Progress Notes (Signed)
PHARMACY - ADULT TOTAL PARENTERAL NUTRITION CONSULT NOTE   Pharmacy Consult:  TPN Indication: SBO / Ileus  Patient Measurements: Height: 5\' 7"  (170.2 cm) Weight: 129 lb 13.6 oz (58.9 kg) IBW/kg (Calculated) : 66.1 TPN AdjBW (KG): 58.9 Body mass index is 20.34 kg/m.  Assessment:  75 YOM presented 04/30/17 with abdominal pain.  CT on admit showed pSBO with possible inguinal abscess (now suspected to be a hematoma).  Patient has a history of hernia repair with mesh and SBR on 04/08/17.  Per RD assessment, patient has severe malnutrition and was consuming </= 50% for >/= 1 month.  Pharmacy consulted to initiate TPN for ileus, SBO and anticipated prolonged NPO status.  He is at risk for refeeding.  GI: NG O/P 1548mL (bloody gastric secretions), LBM 12/23 - PPI IV, PRN Zofran Endo: no hx DM - AM glucose WNL Insulin requirements in the past 24 hours: N/A Lytes: 12/29 labs - K+ 3.6, Cl low and high CO2, Na WNL.  No Mag and Phos.  Today's labs pending since 10 AM. Renal: 12/29 labs:  SCr 0.5, BUN WNL - UOP 0.5 ml/kg/hr, net +2L since admit, D51/2NS 20K at 75 ml/hr Pulm: COPD on 3L O2 PTA, pleural effusion on CT - currently on 3L [place] Cards: CHF (EF 20%) / CAD / HLD / HTN - VSS - Lasix Hepatobil: LFTs / tbili WNL Neuro: depression - PRN morphine, pain score 2 ID: Zosyn (12/25 >> ) for inguinal abscess - afebrile, WBC WNL, no cx TPN Access: PICC to be placed 05/05/17 TPN start date: 05/05/17  Nutritional Goals (per RD rec on 12/28): 1750-1950 kCal and 85-100gm per day  Current Nutrition:  NPO   Plan:  Initiate concentrated TPN at 25 ml/hr (goal rate 55 ml/hr) TPN will provide 43g AA, 126g CHO, and 19g ILE for a total of 793 kCal, meeting 45% of goals Electrolytes in TPN: standard Na, Ca, K.  Max chloride. Daily multivitamin and trace elements in TPN Reduce D51/2NS 20K to 50 ml/hr when TPN starts Start sensitive SSI Q6H Standard TPN labs and nursing care orders, monitor volume status F/U  labs and supplement outside of TPN if needed   Jarett Dralle D. Mina Marble, PharmD, BCPS Pager:  [phone] 05/05/2017, 1:57 PM

## 2017-05-05 NOTE — Progress Notes (Signed)
    CC: Abdominal pain  Subjective: No real change still distended, NG drainage is up to 1-1/2 L yesterday.  No flatus.  He is not really that uncomfortable with it.  He is up ambulating some.  Objective: Vital signs in last 24 hours: Temp:  [97.8 F (36.6 C)-98.1 F (36.7 C)] 97.8 F (36.6 C) (12/30 0653) Pulse Rate:  [61-68] 66 (12/30 0653) Resp:  [16-18] 16 (12/30 0653) BP: (98-102)/(61-63) 100/61 (12/30 0653) SpO2:  [90 %-93 %] 93 % (12/30 0653) Last BM Date: 04/30/17 2215 IV 750 urine 1550 per NG Starting daily weights Afebrile, VSS Last film 05/03/17   Intake/Output from previous day: 12/29 0701 - 12/30 0700 In: 2215 [I.V.:1775; NG/GT:290; IV Piggyback:150] Out: 2300 [Urine:750; Emesis/NG output:1550] Intake/Output this shift: Total I/O In: 3 [I.V.:3] Out: -   General appearance: alert, cooperative and no distress Resp: clear to auscultation bilaterally GI: Still slightly distended.  Not really tender.  No bowel sounds, no flatus, no BM.  Incisions all look fine.  Lab Results:  Recent Labs    05/03/17 0537 05/04/17 0646  WBC 10.2 7.7  HGB 11.0* 10.9*  HCT 36.5* 36.6*  PLT 190 180    BMET Recent Labs    05/03/17 0537 05/04/17 0646  NA 141 140  K 3.9 3.6  CL 95* 96*  CO2 37* 36*  GLUCOSE 119* 104*  BUN 17 8  CREATININE 0.60* 0.50*  CALCIUM 8.5* 8.4*   PT/INR No results for input(s): LABPROT, INR in the last 72 hours.  Recent Labs  Lab 05/01/17 0504  AST 13*  ALT 10*  ALKPHOS 61  BILITOT 0.8  PROT 5.7*  ALBUMIN 2.5*     Lipase     Component Value Date/Time   LIPASE 24 04/05/2017 2342     Medications: . enoxaparin (LOVENOX) injection  40 mg Subcutaneous Q24H  . furosemide  20 mg Intravenous Daily  . mouth rinse  15 mL Mouth Rinse BID  . pantoprazole (PROTONIX) IV  40 mg Intravenous Q24H  . sodium chloride flush  3 mL Intravenous Q12H    Assessment/Plan S/P ex-lap with small bowel resection and left femoral hernia repair  with mesh - Dr. Brantley Stage 04/08/17 Post-operative ileus vs SBO -3.0 x 2.5 x 3.2 cmfluid collection/possible abscess on CT 04/30/17 - SB protocol,with no contrast to colon 12/27 or 12/28 - NGTpulled by pt 12/27 - vomited last PM and replaced 650 ml recorded - mobilize as able - hydrate with IVF, will put at 75cc/h will defer to medicine if rate needs to be changed given CHF  HTN COPD- on O2 Chronic systolic CHF- EF 12% Malnutrition - Insert PICC and start TNA  FEN: NPO, IVF; NGT to LIWS VTE: SCDs, SQ heparin stopped 12/28 by Dr. Sarajane Jews ID: IV zosyn 12/25>>day 5    azithromycin 12/26 -05/03/17 Foley: None Follow up: [facility]   Plan: We discussed this at morning check out this a.m.  We plan to wait this out.  We discussed repeating the CT and possibly drain the fluid and it was decided to simply continue antibiotics, with no plans to try and drain at this time.  Because of the prolonged situation we will plan to insert a PICC line and start TNA.        LOS: 5 days    Eleanore Junio 05/05/2017 [id number]

## 2017-05-06 ENCOUNTER — Other Ambulatory Visit: Payer: Self-pay | Admitting: *Deleted

## 2017-05-06 DIAGNOSIS — E43 Unspecified severe protein-calorie malnutrition: Secondary | ICD-10-CM

## 2017-05-06 LAB — DIFFERENTIAL
BASOS ABS: 0 10*3/uL (ref 0.0–0.1)
BASOS PCT: 0 %
EOS ABS: 0.2 10*3/uL (ref 0.0–0.7)
EOS PCT: 2 %
Lymphocytes Relative: 15 %
Lymphs Abs: 1.1 10*3/uL (ref 0.7–4.0)
Monocytes Absolute: 0.4 10*3/uL (ref 0.1–1.0)
Monocytes Relative: 5 %
NEUTROS PCT: 78 %
Neutro Abs: 6 10*3/uL (ref 1.7–7.7)

## 2017-05-06 LAB — COMPREHENSIVE METABOLIC PANEL
ALK PHOS: 47 U/L (ref 38–126)
ALT: 7 U/L — AB (ref 17–63)
ANION GAP: 3 — AB (ref 5–15)
AST: 13 U/L — ABNORMAL LOW (ref 15–41)
Albumin: 2.3 g/dL — ABNORMAL LOW (ref 3.5–5.0)
BUN: 6 mg/dL (ref 6–20)
CALCIUM: 8.1 mg/dL — AB (ref 8.9–10.3)
CO2: 35 mmol/L — AB (ref 22–32)
CREATININE: 0.48 mg/dL — AB (ref 0.61–1.24)
Chloride: 100 mmol/L — ABNORMAL LOW (ref 101–111)
Glucose, Bld: 132 mg/dL — ABNORMAL HIGH (ref 65–99)
Potassium: 3.9 mmol/L (ref 3.5–5.1)
SODIUM: 138 mmol/L (ref 135–145)
TOTAL PROTEIN: 5 g/dL — AB (ref 6.5–8.1)
Total Bilirubin: 0.6 mg/dL (ref 0.3–1.2)

## 2017-05-06 LAB — TRIGLYCERIDES: TRIGLYCERIDES: 79 mg/dL (ref <150)

## 2017-05-06 LAB — CBC
HCT: 34 % — ABNORMAL LOW (ref 39.0–52.0)
HEMOGLOBIN: 10.5 g/dL — AB (ref 13.0–17.0)
MCH: 29.7 pg (ref 26.0–34.0)
MCHC: 30.9 g/dL (ref 30.0–36.0)
MCV: 96 fL (ref 78.0–100.0)
PLATELETS: 151 10*3/uL (ref 150–400)
RBC: 3.54 MIL/uL — ABNORMAL LOW (ref 4.22–5.81)
RDW: 13.7 % (ref 11.5–15.5)
WBC: 7.7 10*3/uL (ref 4.0–10.5)

## 2017-05-06 LAB — GLUCOSE, CAPILLARY
GLUCOSE-CAPILLARY: 112 mg/dL — AB (ref 65–99)
GLUCOSE-CAPILLARY: 125 mg/dL — AB (ref 65–99)
GLUCOSE-CAPILLARY: 126 mg/dL — AB (ref 65–99)
GLUCOSE-CAPILLARY: 137 mg/dL — AB (ref 65–99)
GLUCOSE-CAPILLARY: 142 mg/dL — AB (ref 65–99)

## 2017-05-06 LAB — MAGNESIUM: MAGNESIUM: 2.1 mg/dL (ref 1.7–2.4)

## 2017-05-06 LAB — PREALBUMIN: PREALBUMIN: 7.7 mg/dL — AB (ref 18–38)

## 2017-05-06 LAB — PHOSPHORUS: PHOSPHORUS: 2.3 mg/dL — AB (ref 2.5–4.6)

## 2017-05-06 MED ORDER — KCL IN DEXTROSE-NACL 20-5-0.45 MEQ/L-%-% IV SOLN
INTRAVENOUS | Status: AC
  Administered 2017-05-07: via INTRAVENOUS
  Filled 2017-05-06: qty 1000

## 2017-05-06 MED ORDER — TRACE MINERALS CR-CU-MN-SE-ZN 10-1000-500-60 MCG/ML IV SOLN
INTRAVENOUS | Status: AC
  Administered 2017-05-06: 18:00:00 via INTRAVENOUS
  Filled 2017-05-06: qty 633.6

## 2017-05-06 MED ORDER — KETOROLAC TROMETHAMINE 15 MG/ML IJ SOLN
15.0000 mg | Freq: Four times a day (QID) | INTRAMUSCULAR | Status: DC | PRN
  Administered 2017-05-07: 15 mg via INTRAVENOUS
  Filled 2017-05-06 (×2): qty 1

## 2017-05-06 MED ORDER — BISACODYL 10 MG RE SUPP
10.0000 mg | Freq: Every day | RECTAL | Status: DC | PRN
  Administered 2017-05-06 – 2017-05-09 (×3): 10 mg via RECTAL
  Filled 2017-05-06 (×3): qty 1

## 2017-05-06 NOTE — Progress Notes (Signed)
   Subjective/Chief Complaint: Pt with no bowel movements No n/v No abd pain   Objective: Vital signs in last 24 hours: Temp:  [97.7 F (36.5 C)-98 F (36.7 C)] 97.8 F (36.6 C) (12/31 0508) Pulse Rate:  [62-65] 65 (12/31 0508) Resp:  [16] 16 (12/31 0508) BP: (94-107)/(56-64) 95/56 (12/31 0508) SpO2:  [93 %-96 %] 93 % (12/31 0508) Weight:  [56.5 kg (124 lb 9 oz)] 56.5 kg (124 lb 9 oz) (12/30 2058) Last BM Date: 04/30/17  Intake/Output from previous day: 12/30 0701 - 12/31 0700 In: 936.3 [I.V.:876.3; NG/GT:10; IV Piggyback:50] Out: 1050 [Urine:650; Emesis/NG output:400] Intake/Output this shift: Total I/O In: 0  Out: 100 [Urine:100]  Constitutional: No acute distress, conversant, appears states age. Eyes: Anicteric sclerae, moist conjunctiva, no lid lag Lungs: Clear to auscultation bilaterally, normal respiratory effort CV: regular rate and rhythm, no murmurs, no peripheral edema, pedal pulses 2+ GI: Soft, no masses or hepatosplenomegaly, non-tender to palpation, incision c/d/i, no erythema/ drainage Skin: No rashes, palpation reveals normal turgor Psychiatric: appropriate judgment and insight, oriented to person, place, and time   Lab Results:  Recent Labs    05/04/17 0646 05/06/17 0430  WBC 7.7 7.7  HGB 10.9* 10.5*  HCT 36.6* 34.0*  PLT 180 151   BMET Recent Labs    05/05/17 1550 05/06/17 0430  NA 139 138  K 3.6 3.9  CL 100* 100*  CO2 37* 35*  GLUCOSE 92 132*  BUN 6 6  CREATININE 0.56* 0.48*  CALCIUM 8.2* 8.1*   Anti-infectives: Anti-infectives (From admission, onward)   Start     Dose/Rate Route Frequency Ordered Stop   05/02/17 0030  azithromycin (ZITHROMAX) 500 mg in dextrose 5 % 250 mL IVPB  Status:  Discontinued     500 mg 250 mL/hr over 60 Minutes Intravenous Daily at bedtime 05/01/17 2351 05/03/17 1315   05/01/17 0000  piperacillin-tazobactam (ZOSYN) IVPB 3.375 g     3.375 g 12.5 mL/hr over 240 Minutes Intravenous Every 8 hours 04/30/17  2213     04/30/17 1730  piperacillin-tazobactam (ZOSYN) IVPB 3.375 g     3.375 g 100 mL/hr over 30 Minutes Intravenous  Once 04/30/17 1728 04/30/17 1823      Assessment/Plan: S/P ex-lap with small bowel resection and left femoral hernia repair with mesh - Dr. Brantley Stage 04/08/17 Post-operative ileus vs SBO -3.0 x 2.5 x 3.2 cmfluid collection/possible abscess on CT 04/30/17 - SB protocol,with no contrast to colon 12/27 or 12/28 - NGT in - mobilize as able -on TNA -will try supp HTN COPD- on O2 Chronic systolic CHF- EF 16%     LOS: 6 days    Tommy Jacks., [facility] 05/06/2017

## 2017-05-06 NOTE — Progress Notes (Signed)
PHARMACY - ADULT TOTAL PARENTERAL NUTRITION CONSULT NOTE   Pharmacy Consult:  TPN Indication: SBO / Ileus  Patient Measurements: Height: 5\' 7"  (170.2 cm) Weight: 124 lb 9 oz (56.5 kg) IBW/kg (Calculated) : 66.1 TPN AdjBW (KG): 58.9 Body mass index is 19.51 kg/m.  Assessment:  Tommy Turner presented 04/30/17 with abdominal pain.  CT on admit showed pSBO with possible inguinal abscess (now suspected to be a hematoma).  Patient has a history of hernia repair with mesh and SBR on 04/08/17.  Per RD assessment, patient has severe malnutrition and was consuming </= 50% for >/= 1 month.  Pharmacy consulted to initiate TPN for ileus, SBO and anticipated prolonged NPO status.  He is at risk for refeeding.  GI: NG O/P 1524mL>400ml last 24h (bloody gastric secretions), LBM 12/25 - PPI IV, PRN Zofran. Prealbumin 7.7  Endo: no hx DM - AM glucose WNL Insulin requirements in the past 24 hours: 2 units  Lytes: Cl 100 slightly low, Bicarb high but trending down. Ca 8.1 corrects to 9.46, Phos 2.3 remains low,   Renal:Scr 0.48. UOP 0.7. I/O +1.9L total. Pulm: COPD on 3L O2 PTA, pleural effusion on CT - currently on 3L  Cards: CHF (EF 20%) / CAD / HLD / HTN - BP low - Lasix IV Hepatobil: LFTs / tbili WNL Neuro: depression - PRN morphine,  ID: Zosyn (12/25 >> ) for inguinal abscess - afebrile, WBC WNL, no cx TPN Access: PICC to be placed 05/05/17 TPN start date: 05/05/17  Nutritional Goals (per RD rec on 12/28): 1750-1950 kCal and 85-100gm per day  Current Nutrition:  NPO   Plan:  Increase concentrated TPN to Tommy ml/hr (goal rate Tommy ml/hr) with lipids --TPN will provide 1745 kcal and 95g protein Electrolytes in TPN: Increase phos. Max chloride. Daily multivitamin and trace elements in TPN Reduce D51/2NS 20K to kvo ml/hr when new TPN starts Start sensitive SSI Q6H Standard TPN labs and nursing care orders, monitor volume status F/U labs and supplement outside of TPN if needed    Jovana Rembold S.  Alford Highland, PharmD, [facility] Clinical Staff Pharmacist Pager [phone]  05/06/2017, 10:48 AM

## 2017-05-06 NOTE — Progress Notes (Addendum)
Initial Nutrition Assessment  DOCUMENTATION CODES:   Severe malnutrition in context of chronic illness  INTERVENTION:   TPN per pharmacy  Daily MVI in TPN  Add IV thiamine 100mg  daily   Pt at high refeeding risk; recommend check P, K, and Mg daily for 3 days after TPN iniatition.   Daily weights  NUTRITION DIAGNOSIS:   Severe Malnutrition related to chronic illness(COPD) as evidenced by energy intake < or equal to 50% for > or equal to 1 month, severe fat depletion, severe muscle depletion.  Ongoing   GOAL:   Patient will meet greater than or equal to 90% of their needs  Progressing via TPN   MONITOR:   Diet advancement, Labs, Weight trends, I & O's, Other (Comment)(TPN tolerance)  ASSESSMENT:   Tommy Turner is a 76 y.o. male with medical history significant of chronic systolic congestive heart failure with ejection fraction of 20%, COPD on home oxygen at 3 L, recent surgery for incarcerated inguinal hernia.  He was discharged from the hospital on 12/6 and reported that he was doing fairly well.  Approximately 2 days ago he began to experience nausea.  That was the last time he had a bowel movement.   RD consulted for new TPN. Per chart, pt is net positive 1.8 L since admission.  NGT remains for low intermittent suction.  Recommend obtaining daily weights to monitor pt's fluid status. Pt continues with no BM since last RD visit.  Pharmacy initated TPN at 25 mL/hr (goal rate 55 mL/hr), current regimen providing 793 kcal and 43 grams protein.  Labs reviewed; CBG 83-142, Phosphorus 2.3, Albumin 2.3, Magnesium (WNL), triglycerides 79 (WNL) Medications reviewed; sliding scale insulin, Lasix, Protonix, D5 @ 50 ml/hr (reduced)  Diet Order:  Diet NPO time specified Except for: Ice Chips TPN ADULT (ION)  EDUCATION NEEDS:   No education needs have been identified at this time  Skin:  Skin Assessment: Skin Integrity Issues: Skin Integrity Issues::  Incisions Incisions: closed lt groin and abdomen  Last BM:  04/30/17  Height:   Ht Readings from Last 1 Encounters:  04/30/17 5\' 7"  (1.702 m)    Weight:   Wt Readings from Last 1 Encounters:  05/05/17 124 lb 9 oz (56.5 kg)    Ideal Body Weight:  67.3 kg  BMI:  Body mass index is 19.51 kg/m.  Estimated Nutritional Needs:   Kcal:  6301-6010  Protein:  85-100 grams  Fluid:  >1.7 L  Parks Ranger, MS, RDN, LDN 05/06/2017 10:27 AM

## 2017-05-06 NOTE — Progress Notes (Signed)
  PROGRESS NOTE  DALLAS TOROK QIW:[id number] DOB: 01-Nov-1940 DOA: 04/30/2017 PCP: [name], MD  Brief Narrative: 76yom presented with abd pain. PMH recent small bowel resection and hernia repair. [facility].  Assessment/Plan SBO. Status post repair of a left femoral hernia with mesh as well as an exploratory laparotomy and small bowel resection 12/3. Surgery feels possible left inguinal abscess is a hematoma--further management deferred to surgery. - continue management per surgery 12/31  COPD on home oxygen on 3L; chronic hypoxic resp failure - remains stable 12/31; continue oxygen, albuterol PRN  Chronic systolic CHF, LVEF 41% 74/0814 - +1.9 L but appears stable 12/31. Continue daily Lasix today  Severe malnutrition - per dietician, pharmacy  Abnormal CXR - no pulm symptoms, monitor for aspiration or development of pneumonia given SBO and left infiltrate.   DVT prophylaxis: enoxaparin per surgery Code Status: DNR Family Communication: family at bedside 12/31 Disposition Plan: home    [name], MD  [facility] Direct contact: [phone] --[place]ia [place]  --[email or web address]; password [email or web address]  7PM-7AM contact night coverage as above 05/06/2017, 3:18 PM  LOS: 6 days   Consultants:  General surgery  Procedures:    Antimicrobials:  Zosyn 12/25 >>  Azithromycin 12/26 >> 12/28  Interval history/Subjective: Surgery rec suppository. Small, hard BM s/p suppository. Positive flatus.  Objective: Vitals:  Vitals:   05/06/17 0508 05/06/17 1505  BP: (!) 95/56 (!) 111/54  Pulse: 65 67  Resp: 16 17  Temp: 97.8 F (36.6 C) 98.2 F (36.8 C)  SpO2: 93% 95%    Exam:  Constitutional:   . Appears calm and comfortable Respiratory:  . CTA bilaterally, no w/r/r.  . Respiratory effort normal.  Cardiovascular:  . RRR, no m/r/g . No LE extremity edema   Abdomen:  . Soft  Psychiatric:  . Mental status o Mood, affect  appropriate  I have personally reviewed the following:  Filed Weights   04/30/17 1434 04/30/17 2118 05/05/17 2058  Weight: 56.7 kg (125 lb) 58.9 kg (129 lb 13.6 oz) 56.5 kg (124 lb 9 oz)   Weight change:    Labs:    Scheduled Meds: . enoxaparin (LOVENOX) injection  40 mg Subcutaneous Q24H  . furosemide  20 mg Intravenous Daily  . insulin aspart  0-9 Units Subcutaneous Q6H  . mouth rinse  15 mL Mouth Rinse BID  . pantoprazole (PROTONIX) IV  40 mg Intravenous Q24H  . sodium chloride flush  3 mL Intravenous Q12H   Continuous Infusions: . sodium chloride 250 mL (05/01/17 0700)  . dextrose 5 % and 0.45 % NaCl with KCl 20 mEq/L 50 mL/hr at 05/05/17 1834  . dextrose 5 % and 0.45 % NaCl with KCl 20 mEq/L    . piperacillin-tazobactam (ZOSYN)  IV Stopped (05/06/17 1243)  . TPN ADULT (ION) 25 mL/hr at 05/05/17 1820  . TPN ADULT (ION)      Principal Problem:   SBO (small bowel obstruction) (HCC) Active Problems:   Essential hypertension   COPD (chronic obstructive pulmonary disease) (HCC)   Chronic respiratory failure with hypoxia and hypercapnia (HCC)   Systolic CHF, chronic ([place])   LOS: 6 days

## 2017-05-06 NOTE — Patient Outreach (Signed)
[facility]) Care Management  05/06/2017  Tommy Turner 08-08-1940 [id number]   No response from patient outreach attempts for previous hospitalization,  will attempt to reach patient after discharge from current hospitalization.     Objective:Per KPN (Knowledge Performance Now, point of care tool) and chart review,patient admitted to hospital on 04/30/17 with partial small bowel obstruction and remains inpatient.  Patient hospitalized 04/05/17 -04/11/17 forIncarcerated inguinal hernia.Status postExploratory laparotomy with small bowel resection and repair of left femoral hernia with meshon 04/08/17.Patient hospitalized 04/04/16 - 04/17/16 for sepsis due to community acquired pneumonia, Acute on chronic hypoxic respiratory failure (Due to pneumonia and possible COPD exacerbation), Acute encephalopathy metabolic versus medication related. Patient also has a history of hypertension, hyperlipidemia, Abdominal aortic aneurysm, and Chronic systolic CHF (congestive heart failure).  Had ED visit on 11/27/15 for urinary retention. Patient hospitalized 11/24/15 - 11/25/15 for BPH (benign prostatic hypertrophy) with urinary retention. Status post Transurethral resection of the prostate and removal of bladder stones on 11/24/15. Had ED visit on 09/08/15 for urinary retention. [facility] Care Management closed case on 05/09/16 due to unable to contact.      Assessment: Received UMR Transition of care referral on 04/08/17.Transition of care follow up pending patient contact.     Plan:RNCM will call patient for  telephone outreach attempt, transition of care follow up, within 3 business days of hospital discharge notification.      Jamile Rekowski H. Annia Friendly, BSN, [place] Management [facility] Telephonic CM Phone: [phone] Fax: [phone]

## 2017-05-07 DIAGNOSIS — K4190 Unilateral femoral hernia, without obstruction or gangrene, not specified as recurrent: Secondary | ICD-10-CM | POA: Diagnosis not present

## 2017-05-07 DIAGNOSIS — Z66 Do not resuscitate: Secondary | ICD-10-CM | POA: Diagnosis not present

## 2017-05-07 DIAGNOSIS — K56600 Partial intestinal obstruction, unspecified as to cause: Secondary | ICD-10-CM | POA: Diagnosis not present

## 2017-05-07 DIAGNOSIS — K567 Ileus, unspecified: Secondary | ICD-10-CM | POA: Diagnosis not present

## 2017-05-07 DIAGNOSIS — E43 Unspecified severe protein-calorie malnutrition: Secondary | ICD-10-CM | POA: Diagnosis not present

## 2017-05-07 DIAGNOSIS — J9612 Chronic respiratory failure with hypercapnia: Secondary | ICD-10-CM | POA: Diagnosis not present

## 2017-05-07 DIAGNOSIS — I5022 Chronic systolic (congestive) heart failure: Secondary | ICD-10-CM | POA: Diagnosis not present

## 2017-05-07 DIAGNOSIS — Z681 Body mass index (BMI) 19 or less, adult: Secondary | ICD-10-CM | POA: Diagnosis not present

## 2017-05-07 DIAGNOSIS — J9611 Chronic respiratory failure with hypoxia: Secondary | ICD-10-CM | POA: Diagnosis not present

## 2017-05-07 LAB — PHOSPHORUS: PHOSPHORUS: 2.7 mg/dL (ref 2.5–4.6)

## 2017-05-07 LAB — BASIC METABOLIC PANEL
ANION GAP: 5 (ref 5–15)
BUN: 12 mg/dL (ref 6–20)
CHLORIDE: 98 mmol/L — AB (ref 101–111)
CO2: 35 mmol/L — ABNORMAL HIGH (ref 22–32)
Calcium: 8.2 mg/dL — ABNORMAL LOW (ref 8.9–10.3)
Creatinine, Ser: 0.51 mg/dL — ABNORMAL LOW (ref 0.61–1.24)
GFR calc non Af Amer: >60 mL/min (ref >60)
Glucose, Bld: 140 mg/dL — ABNORMAL HIGH (ref 65–99)
POTASSIUM: 3.8 mmol/L (ref 3.5–5.1)
Sodium: 138 mmol/L (ref 135–145)

## 2017-05-07 LAB — GLUCOSE, CAPILLARY
GLUCOSE-CAPILLARY: 114 mg/dL — AB (ref 65–99)
GLUCOSE-CAPILLARY: 128 mg/dL — AB (ref 65–99)
Glucose-Capillary: 149 mg/dL — ABNORMAL HIGH (ref 65–99)
Glucose-Capillary: 140 mg/dL — ABNORMAL HIGH (ref 65–99)

## 2017-05-07 LAB — MAGNESIUM: MAGNESIUM: 2.2 mg/dL (ref 1.7–2.4)

## 2017-05-07 MED ORDER — TRACE MINERALS CR-CU-MN-SE-ZN 10-1000-500-60 MCG/ML IV SOLN
INTRAVENOUS | Status: AC
  Administered 2017-05-07: 18:00:00 via INTRAVENOUS
  Filled 2017-05-07: qty 633.6

## 2017-05-07 NOTE — Progress Notes (Signed)
PHARMACY - ADULT TOTAL PARENTERAL NUTRITION CONSULT NOTE   Pharmacy Consult:  TPN Indication: SBO / Ileus  Patient Measurements: Height: 5\' 7"  (170.2 cm) Weight: 122 lb 9.2 oz (55.6 kg) IBW/kg (Calculated) : 66.1 TPN AdjBW (KG): 58.9 Body mass index is 19.2 kg/m.  Assessment:  68 YOM presented 04/30/17 with abdominal pain.  CT on admit showed pSBO with possible inguinal abscess (now suspected to be a hematoma).  Patient has a history of hernia repair with mesh and SBR on 04/08/17.  Per RD assessment, patient has severe malnutrition and was consuming </= 50% for >/= 1 month.  Pharmacy consulted to initiate TPN for ileus, SBO and anticipated prolonged NPO status.  He is at risk for refeeding.  GI: NG O/P 1549mL>400m>1050l last 24h (bloody gastric secretions), LBM 12/25 - PPI IV, PRN Zofran. Prealbumin 7.7  Endo: no hx DM -CBGs 112-149 Insulin requirements in the past 24 hours: 3 units  Lytes: Cl slightly low, Bicarb high. Ca 8.2 corrects to 9.56, Phos 2.7 improved  Renal:Scr 0.51. UOP 0.7. I/O +1.4L total. Pulm: COPD on 3L O2 PTA, pleural effusion on CT - currently on 3L [place] Cards: CHF (EF 20%) / CAD / HLD / HTN - BP low - Lasix IV Hepatobil: LFTs / tbili ok Neuro: depression - PRN morphine,  ID: Zosyn (12/25 >> ) for inguinal abscess - afebrile, WBC WNL, no cx TPN Access: PICC to be placed 05/05/17 TPN start date: 05/05/17  Nutritional Goals (per RD rec on 12/28): 1750-1950 kCal and 85-100gm per day  Current Nutrition:  NPO TPN at goal 55ml/hr   Plan:  No changes today Concentrated TPN to 55 ml/hr (goal rate 55 ml/hr) with lipids --TPN will provide 1745 kcal and 95g protein Electrolytes in TPN: Increase phos. Max chloride. Daily multivitamin and trace elements in TPN D51/2NS 20K to kvo ml/hr Sensitive SSI Q6H Standard TPN labs and nursing care orders, monitor volume status F/U labs and supplement outside of TPN if needed    Raeana Blinn S. Alford Highland, PharmD,  [place] Clinical Staff Pharmacist Pager [phone]  05/07/2017, 9:59 AM

## 2017-05-07 NOTE — Progress Notes (Signed)
  PROGRESS NOTE  Tommy Turner WNU:[id number] DOB: [birth year] DOA: 04/30/2017 PCP: [name], MD  Brief Narrative: 76yom presented with abd pain. PMH recent small bowel resection and hernia repair. [facility] for surgical evaluation; hospitalization prolonged by SBO/ileus and patient started on TPN. Question of inguinal abscess addressed by surgery, thought to be hematoma. Chronic commodities have remained stable; only active issue is SBO. Bowel function improving, likely home next 48 hours.  Assessment/Plan SBO. Status post repair of a left femoral hernia with mesh as well as an exploratory laparotomy and small bowel resection 12/3. Surgery feels possible left inguinal abscess is a hematoma--further management deferred to surgery. - continue management per surgery 1/1. NGT clamped. - finish abx today  COPD on home oxygen on 3L; chronic hypoxic resp failure - remains stable 12/31; continue oxygen, albuterol PRN  Chronic systolic CHF, LVEF 03% 47/4259 - +1.4 L but appears stable 1/1. Continue daily Lasix today.  Severe malnutrition - per dietician, pharmacy  Abnormal CXR - no pulm symptoms, monitor for aspiration or development of pneumonia given SBO and left infiltrate.   DVT prophylaxis: enoxaparin per surgery Code Status: DNR Family Communication: family at bedside 12/31 Disposition Plan: home    [name], MD  [facility] Direct contact: [phone] --[address]ia [place]  --[email or web address]; password [email or web address]  7PM-7AM contact night coverage as above 05/07/2017, 4:35 PM  LOS: 7 days   Consultants:  General surgery  Procedures:    Antimicrobials:  Zosyn 12/25 >>  Azithromycin 12/26 >> 12/28  Interval history/Subjective: Feel better; hungry; wants to go home. NGT clamped.  Objective: Vitals:  Vitals:   05/07/17 0446 05/07/17 1424  BP: (!) 96/58 106/62  Pulse: 68 (!) 58  Resp: 18 20  Temp: 98 F (36.7 C) 97.9 F (36.6 C)  SpO2:  96% 92%    Exam:  Constitutional:   . Appears calm and comfortable; appears remarkably better Respiratory:  . CTA bilaterally, no w/r/r.  . Respiratory effort normal.  Cardiovascular:  . RRR, no m/r/g . No LE extremity edema   Psychiatric:  . Mental status o Mood, affect appropriate  I have personally reviewed the following:  Filed Weights   04/30/17 2118 05/05/17 2058 05/07/17 0449  Weight: 58.9 kg (129 lb 13.6 oz) 56.5 kg (124 lb 9 oz) 55.6 kg (122 lb 9.2 oz)   Weight change: -0.9 kg (-15.8 oz)   Labs:  Creatinine stable, 0.51  Scheduled Meds: . enoxaparin (LOVENOX) injection  40 mg Subcutaneous Q24H  . furosemide  20 mg Intravenous Daily  . insulin aspart  0-9 Units Subcutaneous Q6H  . mouth rinse  15 mL Mouth Rinse BID  . pantoprazole (PROTONIX) IV  40 mg Intravenous Q24H  . sodium chloride flush  3 mL Intravenous Q12H   Continuous Infusions: . sodium chloride 250 mL (05/01/17 0700)  . dextrose 5 % and 0.45 % NaCl with KCl 20 mEq/L 20 mL/hr at 05/07/17 0019  . piperacillin-tazobactam (ZOSYN)  IV 3.375 g (05/07/17 1546)  . TPN ADULT (ION) 55 mL/hr at 05/07/17 1500  . TPN ADULT (ION)      Principal Problem:   SBO (small bowel obstruction) (HCC) Active Problems:   COPD (chronic obstructive pulmonary disease) (HCC)   Chronic respiratory failure with hypoxia and hypercapnia (HCC)   Systolic CHF, chronic (HCC)   Severe malnutrition ([place])   LOS: 7 days

## 2017-05-07 NOTE — Progress Notes (Signed)
   Subjective/Chief Complaint: Pt doing well this AM Had BM x 2 yesterday Ambulated    Objective: Vital signs in last 24 hours: Temp:  [97.6 F (36.4 C)-98.2 F (36.8 C)] 98 F (36.7 C) (01/01 0446) Pulse Rate:  [67-68] 68 (01/01 0446) Resp:  [16-18] 18 (01/01 0446) BP: (96-111)/(54-58) 96/58 (01/01 0446) SpO2:  [95 %-96 %] 96 % (01/01 0446) Weight:  [55.6 kg (122 lb 9.2 oz)] 55.6 kg (122 lb 9.2 oz) (01/01 0449) Last BM Date: 04/30/17  Intake/Output from previous day: 12/31 0701 - 01/01 0700 In: 1769.1 [I.V.:1689.1; NG/GT:30; IV Piggyback:50] Out: 2275 [Urine:1225; Emesis/NG output:1050] Intake/Output this shift: No intake/output data recorded.  General appearance: alert and cooperative GI: soft, non-tender; bowel sounds normal; no masses,  no organomegaly and inc c/d/i, no erythema/drainage  Lab Results:  Recent Labs    05/06/17 0430  WBC 7.7  HGB 10.5*  HCT 34.0*  PLT 151   BMET Recent Labs    05/06/17 0430 05/07/17 0419  NA 138 138  K 3.9 3.8  CL 100* 98*  CO2 35* 35*  GLUCOSE 132* 140*  BUN 6 12  CREATININE 0.48* 0.51*  CALCIUM 8.1* 8.2*   PT/INR No results for input(s): LABPROT, INR in the last 72 hours. ABG No results for input(s): PHART, HCO3 in the last 72 hours.  Invalid input(s): PCO2, PO2  Studies/Results: No results found.  Anti-infectives: Anti-infectives (From admission, onward)   Start     Dose/Rate Route Frequency Ordered Stop   05/02/17 0030  azithromycin (ZITHROMAX) 500 mg in dextrose 5 % 250 mL IVPB  Status:  Discontinued     500 mg 250 mL/hr over 60 Minutes Intravenous Daily at bedtime 05/01/17 2351 05/03/17 1315   05/01/17 0000  piperacillin-tazobactam (ZOSYN) IVPB 3.375 g     3.375 g 12.5 mL/hr over 240 Minutes Intravenous Every 8 hours 04/30/17 2213     04/30/17 1730  piperacillin-tazobactam (ZOSYN) IVPB 3.375 g     3.375 g 100 mL/hr over 30 Minutes Intravenous  Once 04/30/17 1728 04/30/17 1823       Assessment/Plan: S/P ex-lap with small bowel resection and left femoral hernia repair with mesh - Dr. Brantley Stage 04/08/17 Post-operative ileus vs SBO -3.0 x 2.5 x 3.2 cmfluid collection/possible abscess on CT 04/30/17 - clamp NGT, ileus appears to be resolving - mobilize as able -on TNA  HTN COPD- on O2 Chronic systolic CHF- EF 31%     LOS: 7 days    Rosario Jacks., [facility] 05/07/2017

## 2017-05-08 DIAGNOSIS — J439 Emphysema, unspecified: Secondary | ICD-10-CM

## 2017-05-08 LAB — GLUCOSE, CAPILLARY
GLUCOSE-CAPILLARY: 132 mg/dL — AB (ref 65–99)
GLUCOSE-CAPILLARY: 135 mg/dL — AB (ref 65–99)
GLUCOSE-CAPILLARY: 88 mg/dL (ref 65–99)
Glucose-Capillary: 133 mg/dL — ABNORMAL HIGH (ref 65–99)
Glucose-Capillary: 141 mg/dL — ABNORMAL HIGH (ref 65–99)

## 2017-05-08 MED ORDER — TRACE MINERALS CR-CU-MN-SE-ZN 10-1000-500-60 MCG/ML IV SOLN
INTRAVENOUS | Status: AC
  Administered 2017-05-08: 18:00:00 via INTRAVENOUS
  Filled 2017-05-08: qty 345.6

## 2017-05-08 MED ORDER — WHITE PETROLATUM EX OINT
TOPICAL_OINTMENT | CUTANEOUS | Status: AC
  Administered 2017-05-08: 18:00:00
  Filled 2017-05-08: qty 28.35

## 2017-05-08 NOTE — Progress Notes (Signed)
PHARMACY - ADULT TOTAL PARENTERAL NUTRITION CONSULT NOTE   Pharmacy Consult:  TPN Indication: SBO / Ileus  Patient Measurements: Height: 5\' 7"  (170.2 cm) Weight: 123 lb 14.4 oz (56.2 kg) IBW/kg (Calculated) : 66.1 TPN AdjBW (KG): 58.9 Body mass index is 19.41 kg/m.  Assessment:  32 YOM presented 04/30/17 with abdominal pain.  CT on admit showed pSBO with possible inguinal abscess (now suspected to be a hematoma).  Patient has a history of hernia repair with mesh and SBR on 04/08/17.  Per RD assessment, patient has severe malnutrition and was consuming </= 50% goals for >/= 1 month. TPN initiated for ileus, SBO and anticipated prolonged NPO status.  He is at risk for refeeding.  GI: NG O/P 1579mL>400m>1050l last 24h (bloody gastric secretions), LBM 12/31 - PPI IV, PRN Zofran. Prealbumin 7.7  Endo: no hx DM -CBGs < 130s Insulin requirements in the past 24 hours: 2 units  Lytes: Cl 98, All other lytes WNL  Renal:Scr 0.51. UOP 1.7 L. I/O +0.5 L total. Pulm: COPD on 3L O2 PTA, pleural effusion on CT - currently on [place] Cards: CHF (EF 20%) / CAD / HLD / HTN - BP low - Lasix IV Hepatobil: LFTs / tbili ok Neuro: depression - PRN morphine,  ID: s/p zosyn (12/25 >> 1/2 ) for inguinal abscess - afebrile, WBC WNL, no cx  TPN Access: PICC to be placed 05/05/17 TPN start date: 05/05/17  Nutritional Goals (per RD rec on 12/28): 1750-1950 kCal and 85-100gm per day  Current Nutrition:  TPN at goal 65ml/hr Clear liquids this am, likely to be advanced to full liquids this evening  Plan:  Begin to wean TPN today after discussion with Georgiana Shore, PA - CCS Decrease TPN to 30 ml/hr with lipids  Electrolytes in TPN: Continue current lytes. Max chloride. Daily multivitamin and trace elements in TPN Sensitive SSI Q6H Standard TPN labs and nursing care orders, monitor volume status F/U advancement of diet, DC TPN thurs?  Levester Fresh, PharmD, BCPS, BCCCP Clinical Pharmacist Clinical phone  for 05/08/2017 from 7a-3:30p: [phone] If after 3:30p, please call main pharmacy at: [phone] 05/08/2017 9:51 AM

## 2017-05-08 NOTE — Progress Notes (Signed)
Patient ID: Tommy Turner, male   DOB: [birth year], 77 y.o.   MRN: [id number]  [facility] Surgery Progress Note     Subjective: CC- tired NG tube clamped since yesterday morning, patient denies abdominal pain, nausea, or vomiting. Passing flatus. No BM yesterday. Has been ambulating. Hopes to go home soon.  Objective: Vital signs in last 24 hours: Temp:  [97.5 F (36.4 C)-98.2 F (36.8 C)] 97.5 F (36.4 C) (01/02 0437) Pulse Rate:  [57-64] 64 (01/02 0513) Resp:  [18-20] 18 (01/02 0513) BP: (96-106)/(49-62) 98/60 (01/02 0513) SpO2:  [92 %-93 %] 93 % (01/02 0513) Weight:  [123 lb 14.4 oz (56.2 kg)] 123 lb 14.4 oz (56.2 kg) (01/02 0437) Last BM Date: 05/06/17  Intake/Output from previous day: 01/01 0701 - 01/02 0700 In: 1851.5 [I.V.:1701.5; IV Piggyback:150] Out: 8546 [Urine:1225; Emesis/NG output:50] Intake/Output this shift: No intake/output data recorded.  PE: Gen:  Alert, NAD, pleasant HEENT: EOM's intact, pupils equal and round Card:  RRR, no M/G/R heard Pulm:  effort normal Abd: Soft, NT/ND, +BS, midline incision C/D/I Ext:  Calves soft and nontender Psych: A&Ox3  Skin: no rashes noted, warm and dry  Lab Results:  Recent Labs    05/06/17 0430  WBC 7.7  HGB 10.5*  HCT 34.0*  PLT 151   BMET Recent Labs    05/06/17 0430 05/07/17 0419  NA 138 138  K 3.9 3.8  CL 100* 98*  CO2 35* 35*  GLUCOSE 132* 140*  BUN 6 12  CREATININE 0.48* 0.51*  CALCIUM 8.1* 8.2*   PT/INR No results for input(s): LABPROT, INR in the last 72 hours. CMP     Component Value Date/Time   NA 138 05/07/2017 0419   NA 143 01/15/2017 1134   K 3.8 05/07/2017 0419   CL 98 (L) 05/07/2017 0419   CO2 35 (H) 05/07/2017 0419   GLUCOSE 140 (H) 05/07/2017 0419   BUN 12 05/07/2017 0419   BUN 8 01/15/2017 1134   CREATININE 0.51 (L) 05/07/2017 0419   CREATININE 0.51 08/12/2014 1602   CALCIUM 8.2 (L) 05/07/2017 0419   PROT 5.0 (L) 05/06/2017 0430   PROT 6.8 01/15/2017 1134   ALBUMIN 2.3 (L) 05/06/2017 0430   ALBUMIN 4.3 01/15/2017 1134   AST 13 (L) 05/06/2017 0430   ALT 7 (L) 05/06/2017 0430   ALKPHOS 47 05/06/2017 0430   BILITOT 0.6 05/06/2017 0430   BILITOT 0.5 01/15/2017 1134   GFRNONAA >60 05/07/2017 0419   GFRAA >60 05/07/2017 0419   Lipase     Component Value Date/Time   LIPASE 24 04/05/2017 2342       Studies/Results: No results found.  Anti-infectives: Anti-infectives (From admission, onward)   Start     Dose/Rate Route Frequency Ordered Stop   05/02/17 0030  azithromycin (ZITHROMAX) 500 mg in dextrose 5 % 250 mL IVPB  Status:  Discontinued     500 mg 250 mL/hr over 60 Minutes Intravenous Daily at bedtime 05/01/17 2351 05/03/17 1315   05/01/17 0000  piperacillin-tazobactam (ZOSYN) IVPB 3.375 g     3.375 g 12.5 mL/hr over 240 Minutes Intravenous Every 8 hours 04/30/17 2213 05/08/17 0423   04/30/17 1730  piperacillin-tazobactam (ZOSYN) IVPB 3.375 g     3.375 g 100 mL/hr over 30 Minutes Intravenous  Once 04/30/17 1728 04/30/17 1823       Assessment/Plan HTN COPD Chronic systolic CHF- EF 27% Malnutrition - on TNA  S/P ex-lap with small bowel resection and left femoral hernia repair with  mesh - Dr. Brantley Stage 04/08/17 Post-operative ileus vs SBO -3.0 x 2.5 x 3.2 cmfluid collection/possible abscess vs hematomaon CT 04/30/17, now off antibiotics - NG tube clamped 1/1, tolerating well  FEN: TPN, CLD VTE: SCDs, lovenox ID: [email or web address] 12/25>>1/2;azithromycin 12/27 >>12/28 Foley: None Follow up: [facility]  Plan: D/c NG tube and advance to clear liquids. Ok to give full liquids for dinner if patient tolerates clears. Continue mobilizing.    LOS: 8 days    Wellington Hampshire , [facility] Surgery 05/08/2017, 7:56 AM Pager: [phone] Consults: [phone] Mon-Fri 7:00 am-4:30 pm Sat-Sun 7:00 am-11:30 am

## 2017-05-08 NOTE — Progress Notes (Signed)
Patient ID: Tommy Turner, male   DOB: 1940-12-01, 77 y.o.   MRN: [id number]  PROGRESS NOTE    Tommy Turner  OAC:[id number] DOB: 11-Mar-1941 DOA: 04/30/2017 PCP: [name], MD   Brief Narrative:  77 year old male with past medical history of chronic systolic congestive heart failure with ejection fraction of 20%, chronic hypoxic respiratory failure on home oxygen, COPD, recent small bowel resection and hernia repair presented with abdominal pain.  [facility] was [facility] to [facility] for small bowel obstruction as well as possible left inguinal abscess.  General surgery was consulted.  General surgery was of the opinion that there is no left inguinal abscess; imaging findings was secondary to postoperative hematoma and fluid.  Assessment & Plan:   Principal Problem:   SBO (small bowel obstruction) (HCC) Active Problems:   COPD (chronic obstructive pulmonary disease) (HCC)   Chronic respiratory failure with hypoxia and hypercapnia (HCC)   Systolic CHF, chronic (HCC)   Severe malnutrition (HCC)   SBO -  Status post repair of a left femoral hernia with mesh as well as an exploratory laparotomy and small bowel resection on 04/08/17. Surgery feels possible left inguinal abscess is a hematoma--further management deferred to surgery. -General surgery following.  NG tube has been removed.  Clear liquids have been started today.  Currently on TPN -Off antibiotics since yesterday  COPD on home oxygen on 3L; chronic hypoxic resp failure -Stable.  Continue oxygen  Chronic systolic CHF, LVEF 01% 01/3234 -Stable.  Strict input and output.  Continue Lasix  Severe malnutrition -Currently on TPN   DVT prophylaxis: Lovenox Code Status: DNR  family Communication: Spoke to wife at bedside Disposition Plan: Home once cleared by general surgery  Consultants: General surgery  Procedures:  Antimicrobials:  Anti-infectives (From admission, onward)   Start     Dose/Rate  Route Frequency Ordered Stop   05/02/17 0030  azithromycin (ZITHROMAX) 500 mg in dextrose 5 % 250 mL IVPB  Status:  Discontinued     500 mg 250 mL/hr over 60 Minutes Intravenous Daily at bedtime 05/01/17 2351 05/03/17 1315   05/01/17 0000  piperacillin-tazobactam (ZOSYN) IVPB 3.375 g     3.375 g 12.5 mL/hr over 240 Minutes Intravenous Every 8 hours 04/30/17 2213 05/08/17 0423   04/30/17 1730  piperacillin-tazobactam (ZOSYN) IVPB 3.375 g     3.375 g 100 mL/hr over 30 Minutes Intravenous  Once 04/30/17 1728 04/30/17 1823        Subjective: Patient seen and examined at bedside.  He feels better.  No overnight fever or vomiting.  NG tube has been removed.  Objective: Vitals:   05/07/17 1424 05/07/17 2048 05/08/17 0437 05/08/17 0513  BP: 106/62 (!) 98/53 (!) 96/49 98/60  Pulse: (!) 58 (!) 57 62 64  Resp: 20 20 18 18   Temp: 97.9 F (36.6 C) 98.2 F (36.8 C) (!) 97.5 F (36.4 C)   TempSrc: Oral Oral Oral   SpO2: 92% 92% 93% 93%  Weight:   56.2 kg (123 lb 14.4 oz)   Height:        Intake/Output Summary (Last 24 hours) at 05/08/2017 1055 Last data filed at 05/08/2017 1023 Gross per 24 hour  Intake 1851.49 ml  Output 1075 ml  Net 776.49 ml   Filed Weights   05/05/17 2058 05/07/17 0449 05/08/17 0437  Weight: 56.5 kg (124 lb 9 oz) 55.6 kg (122 lb 9.2 oz) 56.2 kg (123 lb 14.4 oz)    Examination:  General exam: Appears calm and comfortable  Respiratory system: Bilateral decreased breath sound at bases Cardiovascular system: S1 & S2 heard, rate controlled.  Gastrointestinal system: Abdomen is nondistended, soft and nontender.  Bowel sounds sluggish.  Extremities: No cyanosis, clubbing, edema   Data Reviewed: I have personally reviewed following labs and imaging studies  CBC: Recent Labs  Lab 05/02/17 0627 05/03/17 0537 05/04/17 0646 05/06/17 0430  WBC 11.3* 10.2 7.7 7.7  NEUTROABS  --   --   --  6.0  HGB 11.8* 11.0* 10.9* 10.5*  HCT 39.0 36.5* 36.6* 34.0*  MCV 96.5  97.6 97.3 96.0  PLT 225 190 180 294   Basic Metabolic Panel: Recent Labs  Lab 05/03/17 0537 05/04/17 0646 05/05/17 1550 05/06/17 0430 05/07/17 0419  NA 141 140 139 138 138  K 3.9 3.6 3.6 3.9 3.8  CL 95* 96* 100* 100* 98*  CO2 37* 36* 37* 35* 35*  GLUCOSE 119* 104* 92 132* 140*  BUN 17 8 6 6 12   CREATININE 0.60* 0.50* 0.56* 0.48* 0.51*  CALCIUM 8.5* 8.4* 8.2* 8.1* 8.2*  MG  --   --  1.9 2.1 2.2  PHOS  --   --  2.3* 2.3* 2.7   GFR: Estimated Creatinine Clearance: 61.5 mL/min (A) (by C-G formula based on SCr of 0.51 mg/dL (L)). Liver Function Tests: Recent Labs  Lab 05/06/17 0430  AST 13*  ALT 7*  ALKPHOS 47  BILITOT 0.6  PROT 5.0*  ALBUMIN 2.3*   No results for input(s): LIPASE, AMYLASE in the last 168 hours. No results for input(s): AMMONIA in the last 168 hours. Coagulation Profile: No results for input(s): INR, PROTIME in the last 168 hours. Cardiac Enzymes: No results for input(s): CKTOTAL, CKMB, CKMBINDEX, TROPONINI in the last 168 hours. BNP (last 3 results) No results for input(s): PROBNP in the last 8760 hours. HbA1C: No results for input(s): HGBA1C in the last 72 hours. CBG: Recent Labs  Lab 05/07/17 0625 05/07/17 1159 05/07/17 1755 05/08/17 0033 05/08/17 0619  GLUCAP 149* 140* 114* 133* 132*   Lipid Profile: Recent Labs    05/06/17 0430  TRIG 79   Thyroid Function Tests: No results for input(s): TSH, T4TOTAL, FREET4, T3FREE, THYROIDAB in the last 72 hours. Anemia Panel: No results for input(s): VITAMINB12, FOLATE, FERRITIN, TIBC, IRON, RETICCTPCT in the last 72 hours. Sepsis Labs: No results for input(s): PROCALCITON, LATICACIDVEN in the last 168 hours.  No results found for this or any previous visit (from the past 240 hour(s)).       Radiology Studies: No results found.      Scheduled Meds: . enoxaparin (LOVENOX) injection  40 mg Subcutaneous Q24H  . furosemide  20 mg Intravenous Daily  . insulin aspart  0-9 Units  Subcutaneous Q6H  . mouth rinse  15 mL Mouth Rinse BID  . pantoprazole (PROTONIX) IV  40 mg Intravenous Q24H  . sodium chloride flush  3 mL Intravenous Q12H   Continuous Infusions: . sodium chloride 250 mL (05/01/17 0700)  . TPN ADULT (ION) 55 mL/hr at 05/07/17 1732  . TPN ADULT (ION)       LOS: 8 days        Aline August, MD [facility] Pager [phone]  If 7PM-7AM, please contact night-coverage [email or web address] Password [facility] 05/08/2017, 10:55 AM

## 2017-05-09 LAB — COMPREHENSIVE METABOLIC PANEL
ALK PHOS: 46 U/L (ref 38–126)
ALT: 8 U/L — AB (ref 17–63)
AST: 18 U/L (ref 15–41)
Albumin: 2.5 g/dL — ABNORMAL LOW (ref 3.5–5.0)
Anion gap: 3 — ABNORMAL LOW (ref 5–15)
BILIRUBIN TOTAL: 0.5 mg/dL (ref 0.3–1.2)
BUN: 12 mg/dL (ref 6–20)
CALCIUM: 8.3 mg/dL — AB (ref 8.9–10.3)
CO2: 35 mmol/L — ABNORMAL HIGH (ref 22–32)
CREATININE: 0.32 mg/dL — AB (ref 0.61–1.24)
Chloride: 101 mmol/L (ref 101–111)
Glucose, Bld: 106 mg/dL — ABNORMAL HIGH (ref 65–99)
Potassium: 3.8 mmol/L (ref 3.5–5.1)
Sodium: 139 mmol/L (ref 135–145)
TOTAL PROTEIN: 5.3 g/dL — AB (ref 6.5–8.1)

## 2017-05-09 LAB — GLUCOSE, CAPILLARY
GLUCOSE-CAPILLARY: 94 mg/dL (ref 65–99)
Glucose-Capillary: 148 mg/dL — ABNORMAL HIGH (ref 65–99)
Glucose-Capillary: 73 mg/dL (ref 65–99)
Glucose-Capillary: 98 mg/dL (ref 65–99)

## 2017-05-09 LAB — PHOSPHORUS: PHOSPHORUS: 2.4 mg/dL — AB (ref 2.5–4.6)

## 2017-05-09 LAB — MAGNESIUM: MAGNESIUM: 2.1 mg/dL (ref 1.7–2.4)

## 2017-05-09 MED ORDER — BOOST / RESOURCE BREEZE PO LIQD CUSTOM
1.0000 | Freq: Three times a day (TID) | ORAL | Status: DC
  Administered 2017-05-09: 237 mL via ORAL

## 2017-05-09 MED ORDER — ENSURE ENLIVE PO LIQD
237.0000 mL | Freq: Three times a day (TID) | ORAL | Status: DC
  Administered 2017-05-09 – 2017-05-10 (×2): 237 mL via ORAL

## 2017-05-09 NOTE — Progress Notes (Signed)
Patient ID: Tommy Turner, male   DOB: 02/02/1941, 77 y.o.   MRN: [id number]  [facility] Surgery Progress Note     Subjective: CC-  No complaints this morning. States that clear liquids did not taste very good, but tolerated them well. Denies n/v. BM yesterday. Ambulated twice yesterday.  Objective: Vital signs in last 24 hours: Temp:  [98.2 F (36.8 C)-98.4 F (36.9 C)] 98.4 F (36.9 C) (01/03 0550) Pulse Rate:  [62-66] 62 (01/03 0550) Resp:  [16-18] 16 (01/03 0550) BP: (98-106)/(57-62) 102/57 (01/03 0550) SpO2:  [93 %-96 %] 93 % (01/03 0550) Weight:  [123 lb 14.4 oz (56.2 kg)-126 lb 1.7 oz (57.2 kg)] 123 lb 14.4 oz (56.2 kg) (01/03 0500) Last BM Date: 04/07/17  Intake/Output from previous day: 01/02 0701 - 01/03 0700 In: 1930.3 [P.O.:660; I.V.:1270.3] Out: 875 [Urine:875] Intake/Output this shift: No intake/output data recorded.  PE: Gen:  Alert, NAD, pleasant HEENT: EOM's intact, pupils equal and round Card:  RRR, no M/G/R heard Pulm:  effort normal Abd: Soft, mild distension, nontender, +BS, midline incision C/D/I Ext:  Calves soft and nontender Psych: A&Ox3  Skin: no rashes noted, warm and dry  Lab Results:  No results for input(s): WBC, HGB, HCT, PLT in the last 72 hours. BMET Recent Labs    05/07/17 0419 05/09/17 0421  NA 138 139  K 3.8 3.8  CL 98* 101  CO2 35* 35*  GLUCOSE 140* 106*  BUN 12 12  CREATININE 0.51* 0.32*  CALCIUM 8.2* 8.3*   PT/INR No results for input(s): LABPROT, INR in the last 72 hours. CMP     Component Value Date/Time   NA 139 05/09/2017 0421   NA 143 01/15/2017 1134   K 3.8 05/09/2017 0421   CL 101 05/09/2017 0421   CO2 35 (H) 05/09/2017 0421   GLUCOSE 106 (H) 05/09/2017 0421   BUN 12 05/09/2017 0421   BUN 8 01/15/2017 1134   CREATININE 0.32 (L) 05/09/2017 0421   CREATININE 0.51 08/12/2014 1602   CALCIUM 8.3 (L) 05/09/2017 0421   PROT 5.3 (L) 05/09/2017 0421   PROT 6.8 01/15/2017 1134   ALBUMIN 2.5 (L)  05/09/2017 0421   ALBUMIN 4.3 01/15/2017 1134   AST 18 05/09/2017 0421   ALT 8 (L) 05/09/2017 0421   ALKPHOS 46 05/09/2017 0421   BILITOT 0.5 05/09/2017 0421   BILITOT 0.5 01/15/2017 1134   GFRNONAA >60 05/09/2017 0421   GFRAA >60 05/09/2017 0421   Lipase     Component Value Date/Time   LIPASE 24 04/05/2017 2342       Studies/Results: No results found.  Anti-infectives: Anti-infectives (From admission, onward)   Start     Dose/Rate Route Frequency Ordered Stop   05/02/17 0030  azithromycin (ZITHROMAX) 500 mg in dextrose 5 % 250 mL IVPB  Status:  Discontinued     500 mg 250 mL/hr over 60 Minutes Intravenous Daily at bedtime 05/01/17 2351 05/03/17 1315   05/01/17 0000  piperacillin-tazobactam (ZOSYN) IVPB 3.375 g     3.375 g 12.5 mL/hr over 240 Minutes Intravenous Every 8 hours 04/30/17 2213 05/08/17 0423   04/30/17 1730  piperacillin-tazobactam (ZOSYN) IVPB 3.375 g     3.375 g 100 mL/hr over 30 Minutes Intravenous  Once 04/30/17 1728 04/30/17 1823       Assessment/Plan HTN COPD Chronic systolic CHF- EF 58% Malnutrition - on TNA  S/P ex-lap with small bowel resection and left femoral hernia repair with mesh - Dr. Brantley Stage 04/08/17 Post-operative ileus  vs SBO -3.0 x 2.5 x 3.2 cmfluid collection/possible abscess vs hematomaon CT 04/30/17, now off antibiotics - tolerating clears  FEN: full liquids, Boost VTE: SCDs, lovenox ID: [email or web address] 12/25>>1/2;azithromycin 12/27 >>12/28 Foley: None Follow up: [facility]  Plan:Advance to full liquids. Add Boost. D/c TPN today. Encourage mobilization. Hopefully home tomorrow.   LOS: 9 days    Wellington Hampshire , [facility] Surgery 05/09/2017, 8:04 AM Pager: [phone] Consults: [phone] Mon-Fri 7:00 am-4:30 pm Sat-Sun 7:00 am-11:30 am

## 2017-05-09 NOTE — Progress Notes (Signed)
Patient ID: Tommy Turner, male   DOB: 02/20/1941, 77 y.o.   MRN: [id number]  PROGRESS NOTE    Tommy Turner  STM:[id number] DOB: [birth year] DOA: 04/30/2017 PCP: [name], MD   Summary:  Patient is a 77 year old male with past medical history significant for chronic systolic congestive heart failure with ejection fraction of 20%, chronic hypoxic respiratory failure on home oxygen, COPD, recent small bowel resection and hernia repair presented with abdominal pain.  He was transferred from any [facility] to [facility] for small bowel obstruction as well as possible left inguinal abscess.  General surgery was consulted.  General surgery was of the opinion that there is no left inguinal abscess; imaging findings was secondary to postoperative hematoma and fluid.  Patient seen today alongside patient's wife. Patient continues to improve. Patient reported breaking wind, but not significantly. No bowel movement. Encouraged to ambulate. Surgery input is highly appreciated  Assessment & Plan:   Principal Problem:   SBO (small bowel obstruction) (HCC) Active Problems:   COPD (chronic obstructive pulmonary disease) (HCC)   Chronic respiratory failure with hypoxia and hypercapnia (HCC)   Systolic CHF, chronic (HCC)   Severe malnutrition (HCC)   SBO -  Status post repair of a left femoral hernia with mesh as well as an exploratory laparotomy and small bowel resection on 04/08/17. Surgery feels possible left inguinal abscess is a hematoma--further management deferred to surgery. -General surgery following.  NG tube has been removed.  Clear liquids have been started today.  Currently on TPN -Off antibiotics since yesterday -Likely DC once cleared by Surgery  COPD on home oxygen on 3L; chronic hypoxic resp failure -Stable.  Continue oxygen  Chronic systolic CHF, LVEF 89% 21/1941 -Stable.  Strict input and output.  Continue Lasix  Severe malnutrition -Currently on TPN   DVT  prophylaxis: Lovenox Code Status: DNR  family Communication: Spoke to wife at bedside Disposition Plan: Home once cleared by general surgery  Consultants: General surgery  Procedures:  Antimicrobials:  Anti-infectives (From admission, onward)   Start     Dose/Rate Route Frequency Ordered Stop   05/02/17 0030  azithromycin (ZITHROMAX) 500 mg in dextrose 5 % 250 mL IVPB  Status:  Discontinued     500 mg 250 mL/hr over 60 Minutes Intravenous Daily at bedtime 05/01/17 2351 05/03/17 1315   05/01/17 0000  piperacillin-tazobactam (ZOSYN) IVPB 3.375 g     3.375 g 12.5 mL/hr over 240 Minutes Intravenous Every 8 hours 04/30/17 2213 05/08/17 0423   04/30/17 1730  piperacillin-tazobactam (ZOSYN) IVPB 3.375 g     3.375 g 100 mL/hr over 30 Minutes Intravenous  Once 04/30/17 1728 04/30/17 1823        Subjective: No new complaints. No bowel movement as yet.  Objective: Vitals:   05/08/17 1815 05/08/17 2131 05/09/17 0500 05/09/17 0550  BP:  (!) 98/59  (!) 102/57  Pulse:  63  62  Resp:  16  16  Temp:  98.4 F (36.9 C)  98.4 F (36.9 C)  TempSrc:  Oral  Oral  SpO2:  96%  93%  Weight: 57.2 kg (126 lb 1.7 oz)  56.2 kg (123 lb 14.4 oz)   Height:        Intake/Output Summary (Last 24 hours) at 05/09/2017 1319 Last data filed at 05/09/2017 0900 Gross per 24 hour  Intake 2077.33 ml  Output 375 ml  Net 1702.33 ml   Filed Weights   05/08/17 0437 05/08/17 1815 05/09/17 0500  Weight:  56.2 kg (123 lb 14.4 oz) 57.2 kg (126 lb 1.7 oz) 56.2 kg (123 lb 14.4 oz)    Examination:  General exam: Appears calm and comfortable  Respiratory system: Bilateral decreased breath sound at bases Cardiovascular system: S1 & S2 heard, rate controlled.  Gastrointestinal system: Abdomen is nondistended, soft and nontender.  Bowel sounds normoactive.  Extremities: No cyanosis, clubbing, edema   Data Reviewed: I have personally reviewed following labs and imaging studies  CBC: Recent Labs  Lab  05/03/17 0537 05/04/17 0646 05/06/17 0430  WBC 10.2 7.7 7.7  NEUTROABS  --   --  6.0  HGB 11.0* 10.9* 10.5*  HCT 36.5* 36.6* 34.0*  MCV 97.6 97.3 96.0  PLT 190 180 973   Basic Metabolic Panel: Recent Labs  Lab 05/04/17 0646 05/05/17 1550 05/06/17 0430 05/07/17 0419 05/09/17 0421  NA 140 139 138 138 139  K 3.6 3.6 3.9 3.8 3.8  CL 96* 100* 100* 98* 101  CO2 36* 37* 35* 35* 35*  GLUCOSE 104* 92 132* 140* 106*  BUN 8 6 6 12 12   CREATININE 0.50* 0.56* 0.48* 0.51* 0.32*  CALCIUM 8.4* 8.2* 8.1* 8.2* 8.3*  MG  --  1.9 2.1 2.2 2.1  PHOS  --  2.3* 2.3* 2.7 2.4*   GFR: Estimated Creatinine Clearance: 61.5 mL/min (A) (by C-G formula based on SCr of 0.32 mg/dL (L)). Liver Function Tests: Recent Labs  Lab 05/06/17 0430 05/09/17 0421  AST 13* 18  ALT 7* 8*  ALKPHOS 47 46  BILITOT 0.6 0.5  PROT 5.0* 5.3*  ALBUMIN 2.3* 2.5*   No results for input(s): LIPASE, AMYLASE in the last 168 hours. No results for input(s): AMMONIA in the last 168 hours. Coagulation Profile: No results for input(s): INR, PROTIME in the last 168 hours. Cardiac Enzymes: No results for input(s): CKTOTAL, CKMB, CKMBINDEX, TROPONINI in the last 168 hours. BNP (last 3 results) No results for input(s): PROBNP in the last 8760 hours. HbA1C: No results for input(s): HGBA1C in the last 72 hours. CBG: Recent Labs  Lab 05/08/17 1216 05/08/17 1800 05/08/17 2323 05/09/17 0614 05/09/17 0815  GLUCAP 135* 141* 88 98 94   Lipid Profile: No results for input(s): CHOL, HDL, LDLCALC, TRIG, CHOLHDL, LDLDIRECT in the last 72 hours. Thyroid Function Tests: No results for input(s): TSH, T4TOTAL, FREET4, T3FREE, THYROIDAB in the last 72 hours. Anemia Panel: No results for input(s): VITAMINB12, FOLATE, FERRITIN, TIBC, IRON, RETICCTPCT in the last 72 hours. Sepsis Labs: No results for input(s): PROCALCITON, LATICACIDVEN in the last 168 hours.  No results found for this or any previous visit (from the past 240  hour(s)).       Radiology Studies: No results found.      Scheduled Meds: . enoxaparin (LOVENOX) injection  40 mg Subcutaneous Q24H  . feeding supplement  1 Container Oral TID BM  . furosemide  20 mg Intravenous Daily  . insulin aspart  0-9 Units Subcutaneous Q6H  . mouth rinse  15 mL Mouth Rinse BID  . pantoprazole (PROTONIX) IV  40 mg Intravenous Q24H  . sodium chloride flush  3 mL Intravenous Q12H   Continuous Infusions: . sodium chloride 250 mL (05/08/17 1900)  . TPN ADULT (ION) 30 mL/hr at 05/08/17 1800     LOS: 9 days        Bonnell Public, MD [facility] Pager [phone]  If 7PM-7AM, please contact night-coverage [email or web address] Password [email or web address] 05/09/2017, 1:19 PM

## 2017-05-09 NOTE — Progress Notes (Signed)
Nutrition Follow-up  DOCUMENTATION CODES:   Severe malnutrition in context of chronic illness  INTERVENTION:   -D/c Boost Breeze po TID, each supplement provides 250 kcal and 9 grams of protein -D/c Ensure Enlive po TID, each supplement provides 350 kcal and 20 grams of protein  NUTRITION DIAGNOSIS:   Severe Malnutrition related to chronic illness(COPD) as evidenced by energy intake < or equal to 50% for > or equal to 1 month, severe fat depletion, severe muscle depletion.  Ongoing  GOAL:   Patient will meet greater than or equal to 90% of their needs  Progressing  MONITOR:   Diet advancement, Labs, Weight trends, I & O's, Other (Comment)(TPN tolerance)  REASON FOR ASSESSMENT:   Malnutrition Screening Tool    ASSESSMENT:   Tommy Turner is a 77 y.o. male with medical history significant of chronic systolic congestive heart failure with ejection fraction of 20%, COPD on home oxygen at 3 L, recent surgery for incarcerated inguinal hernia.  He was discharged from the hospital on 12/6 and reported that he was doing fairly well.  Approximately 2 days ago he began to experience nausea.  That was the last time he had a bowel movement.   12/27- NGT fell out and reinserted (650 ml output within past 24 hours) 1/1- NGT clamped 1/2- D/c NGT, start clears 1/3- start fulls  Pt sleeping soundly at time of visit. Noted pt has Boost Breeze ordered and did accept this AM's dose. PO: 100%.   Pt remains on TPN; pt currently receiving TPN @ 30 ml/hr with lipids. Plan to d/c TPN today.   Labs reviewed: CBGS: 88-141 (inpatient orders for glycemic control are 0-9 units insulin aspart every 6 hours  Diet Order:  TPN ADULT (ION) Diet full liquid Room service appropriate? Yes; Fluid consistency: Thin  EDUCATION NEEDS:   No education needs have been identified at this time  Skin:  Skin Assessment: Skin Integrity Issues: Skin Integrity Issues:: Incisions Incisions: closed lt groin and  abdomen  Last BM:  04/30/17  Height:   Ht Readings from Last 1 Encounters:  04/30/17 5\' 7"  (1.702 m)    Weight:   Wt Readings from Last 1 Encounters:  05/09/17 123 lb 14.4 oz (56.2 kg)    Ideal Body Weight:  67.3 kg  BMI:  Body mass index is 19.41 kg/m.  Estimated Nutritional Needs:   Kcal:  3893-7342  Protein:  85-100 grams  Fluid:  >1.7 L    Auburn Hert A. Jimmye Norman, RD, LDN, CDE Pager: [phone] After hours Pager: [phone]

## 2017-05-10 LAB — GLUCOSE, CAPILLARY
GLUCOSE-CAPILLARY: 79 mg/dL (ref 65–99)
GLUCOSE-CAPILLARY: 73 mg/dL (ref 65–99)
Glucose-Capillary: 91 mg/dL (ref 65–99)

## 2017-05-10 MED ORDER — PANTOPRAZOLE SODIUM 40 MG PO TBEC
40.0000 mg | DELAYED_RELEASE_TABLET | Freq: Every day | ORAL | Status: DC
  Administered 2017-05-10: 40 mg via ORAL
  Filled 2017-05-10: qty 1

## 2017-05-10 NOTE — Progress Notes (Signed)
Patient ID: Tommy Turner, male   DOB: [birth year], 77 y.o.   MRN: [id number]  PROGRESS NOTE    Tommy Turner  WUJ:[id number] DOB: November 08, 1940 DOA: 04/30/2017 PCP: [name], MD   Summary:  Patient is a 77 year old male with past medical history significant for chronic systolic congestive heart failure with ejection fraction of 20%, chronic hypoxic respiratory failure on home oxygen, COPD, recent small bowel resection and hernia repair presented with abdominal pain.  He was [facility] to [facility] for small bowel obstruction as well as possible left inguinal abscess.  General surgery was consulted.  General surgery was of the opinion that there was no left inguinal abscess; imaging findings were secondary to postoperative hematoma and fluid.  Patient has continued to improve. Small bowel movement was reported. Patient is tolerating oral intake so far. Patient will be discharged once cleared by the Surgery team.   Assessment & Plan:   Principal Problem:   SBO (small bowel obstruction) ([place]) Active Problems:   COPD (chronic obstructive pulmonary disease) (HCC)   Chronic respiratory failure with hypoxia and hypercapnia (HCC)   Systolic CHF, chronic (HCC)   Severe malnutrition (HCC)   SBO -  Status post repair of a left femoral hernia with mesh as well as an exploratory laparotomy and small bowel resection on 04/08/17. Surgery feels possible left inguinal abscess is a hematoma--further management deferred to surgery. -General surgery following.  NG tube has been removed. Eating soft diet today.  -Off antibiotics. -Likely DC once cleared by Surgery  COPD on home oxygen on 3L; chronic hypoxic resp failure -Stable.  Continue oxygen  Chronic systolic CHF, LVEF 95% 62/1308 -Stable.  Strict input and output.  Continue Lasix  Severe malnutrition  DVT prophylaxis: Lovenox Code Status: DNR  family Communication: Spoke to wife at bedside Disposition Plan: Home  once cleared by general surgery  Consultants: General surgery  Procedures:  Antimicrobials:  Anti-infectives (From admission, onward)   Start     Dose/Rate Route Frequency Ordered Stop   05/02/17 0030  azithromycin (ZITHROMAX) 500 mg in dextrose 5 % 250 mL IVPB  Status:  Discontinued     500 mg 250 mL/hr over 60 Minutes Intravenous Daily at bedtime 05/01/17 2351 05/03/17 1315   05/01/17 0000  piperacillin-tazobactam (ZOSYN) IVPB 3.375 g     3.375 g 12.5 mL/hr over 240 Minutes Intravenous Every 8 hours 04/30/17 2213 05/08/17 0423   04/30/17 1730  piperacillin-tazobactam (ZOSYN) IVPB 3.375 g     3.375 g 100 mL/hr over 30 Minutes Intravenous  Once 04/30/17 1728 04/30/17 1823        Subjective: No new complaints. No bowel movement as yet.  Objective: Vitals:   05/09/17 2100 05/10/17 0458 05/10/17 0500 05/10/17 1510  BP: 108/62 109/67  (!) 93/52  Pulse: 68 69  69  Resp: 16 16  16   Temp: 98.6 F (37 C) 98.1 F (36.7 C)  98.9 F (37.2 C)  TempSrc: Oral Oral  Oral  SpO2: 96% 93%  93%  Weight:   57.5 kg (126 lb 12.2 oz)   Height:        Intake/Output Summary (Last 24 hours) at 05/10/2017 2013 Last data filed at 05/10/2017 1427 Gross per 24 hour  Intake 326.25 ml  Output 875 ml  Net -548.75 ml   Filed Weights   05/08/17 1815 05/09/17 0500 05/10/17 0500  Weight: 57.2 kg (126 lb 1.7 oz) 56.2 kg (123 lb 14.4 oz) 57.5 kg (126  lb 12.2 oz)    Examination:  General exam: Appears calm and comfortable  Respiratory system: Bilateral decreased breath sound at bases Cardiovascular system: S1 & S2 heard, rate controlled.  Gastrointestinal system: Abdomen is nondistended, soft and nontender.  Bowel sounds normoactive.  Extremities: No cyanosis, clubbing, edema   Data Reviewed: I have personally reviewed following labs and imaging studies  CBC: Recent Labs  Lab 05/04/17 0646 05/06/17 0430  WBC 7.7 7.7  NEUTROABS  --  6.0  HGB 10.9* 10.5*  HCT 36.6* 34.0*  MCV 97.3 96.0    PLT 180 675   Basic Metabolic Panel: Recent Labs  Lab 05/04/17 0646 05/05/17 1550 05/06/17 0430 05/07/17 0419 05/09/17 0421  NA 140 139 138 138 139  K 3.6 3.6 3.9 3.8 3.8  CL 96* 100* 100* 98* 101  CO2 36* 37* 35* 35* 35*  GLUCOSE 104* 92 132* 140* 106*  BUN 8 6 6 12 12   CREATININE 0.50* 0.56* 0.48* 0.51* 0.32*  CALCIUM 8.4* 8.2* 8.1* 8.2* 8.3*  MG  --  1.9 2.1 2.2 2.1  PHOS  --  2.3* 2.3* 2.7 2.4*   GFR: Estimated Creatinine Clearance: 62.9 mL/min (A) (by C-G formula based on SCr of 0.32 mg/dL (L)). Liver Function Tests: Recent Labs  Lab 05/06/17 0430 05/09/17 0421  AST 13* 18  ALT 7* 8*  ALKPHOS 47 46  BILITOT 0.6 0.5  PROT 5.0* 5.3*  ALBUMIN 2.3* 2.5*   No results for input(s): LIPASE, AMYLASE in the last 168 hours. No results for input(s): AMMONIA in the last 168 hours. Coagulation Profile: No results for input(s): INR, PROTIME in the last 168 hours. Cardiac Enzymes: No results for input(s): CKTOTAL, CKMB, CKMBINDEX, TROPONINI in the last 168 hours. BNP (last 3 results) No results for input(s): PROBNP in the last 8760 hours. HbA1C: No results for input(s): HGBA1C in the last 72 hours. CBG: Recent Labs  Lab 05/09/17 1755 05/09/17 2337 05/10/17 0606 05/10/17 1200 05/10/17 1756  GLUCAP 148* 73 79 73 91   Lipid Profile: No results for input(s): CHOL, HDL, LDLCALC, TRIG, CHOLHDL, LDLDIRECT in the last 72 hours. Thyroid Function Tests: No results for input(s): TSH, T4TOTAL, FREET4, T3FREE, THYROIDAB in the last 72 hours. Anemia Panel: No results for input(s): VITAMINB12, FOLATE, FERRITIN, TIBC, IRON, RETICCTPCT in the last 72 hours. Sepsis Labs: No results for input(s): PROCALCITON, LATICACIDVEN in the last 168 hours.  No results found for this or any previous visit (from the past 240 hour(s)).       Radiology Studies: No results found.      Scheduled Meds: . enoxaparin (LOVENOX) injection  40 mg Subcutaneous Q24H  . feeding supplement  (ENSURE ENLIVE)  237 mL Oral TID BM  . furosemide  20 mg Intravenous Daily  . insulin aspart  0-9 Units Subcutaneous Q6H  . mouth rinse  15 mL Mouth Rinse BID  . pantoprazole  40 mg Oral QHS  . sodium chloride flush  3 mL Intravenous Q12H   Continuous Infusions: . sodium chloride 250 mL (05/08/17 1900)     LOS: 10 days        Bonnell Public, MD [facility] Pager [phone]  If 7PM-7AM, please contact night-coverage [email or web address] Password [facility] 05/10/2017, 8:13 PM

## 2017-05-10 NOTE — Consult Note (Signed)
   [facility] CM Inpatient Consult   05/10/2017  KESHAWN FIORITO 22-Aug-1940 [id number]   Spoke with Mr. Jobe Gibbon at bedside to discuss [place] Management follow up call post hospital discharge. He remains agreeable.    Marthenia Rolling, MSN-Ed, RN,BSN [facility] Liaison [phone]

## 2017-05-10 NOTE — Progress Notes (Signed)
Patient ID: Tommy Turner, male   DOB: 08/19/1940, 77 y.o.   MRN: [id number]  [facility] Surgery Progress Note     Subjective: CC-  Patient states that he did not sleep at all last night because he was having crampy abdominal pains. Improved this morning. Currently eating a soft diet and feeling a little better. No n/v. Passing flatus. Small BM yesterday.  Objective: Vital signs in last 24 hours: Temp:  [98.1 F (36.7 C)-98.6 F (37 C)] 98.1 F (36.7 C) (01/04 0458) Pulse Rate:  [68-69] 69 (01/04 0458) Resp:  [16] 16 (01/04 0458) BP: (108-109)/(52-67) 109/67 (01/04 0458) SpO2:  [93 %-97 %] 93 % (01/04 0458) Weight:  [126 lb 12.2 oz (57.5 kg)] 126 lb 12.2 oz (57.5 kg) (01/04 0500) Last BM Date: 05/08/17  Intake/Output from previous day: 01/03 0701 - 01/04 0700 In: 1387.8 [P.O.:740; I.V.:647.8] Out: 250 [Urine:250] Intake/Output this shift: No intake/output data recorded.  PE: Gen: Alert, NAD, pleasant HEENT: EOM's intact, pupils equal and round Card: RRR, no M/G/R heard Pulm: effort normal Abd: Soft, mild distension, mild lower abdominal tenderness, +BS,midlineincision C/D/I SWF:UXNATF soft and nontender Psych: A&Ox3  Skin: no rashes noted, warm and dry  Lab Results:  No results for input(s): WBC, HGB, HCT, PLT in the last 72 hours. BMET Recent Labs    05/09/17 0421  NA 139  K 3.8  CL 101  CO2 35*  GLUCOSE 106*  BUN 12  CREATININE 0.32*  CALCIUM 8.3*   PT/INR No results for input(s): LABPROT, INR in the last 72 hours. CMP     Component Value Date/Time   NA 139 05/09/2017 0421   NA 143 01/15/2017 1134   K 3.8 05/09/2017 0421   CL 101 05/09/2017 0421   CO2 35 (H) 05/09/2017 0421   GLUCOSE 106 (H) 05/09/2017 0421   BUN 12 05/09/2017 0421   BUN 8 01/15/2017 1134   CREATININE 0.32 (L) 05/09/2017 0421   CREATININE 0.51 08/12/2014 1602   CALCIUM 8.3 (L) 05/09/2017 0421   PROT 5.3 (L) 05/09/2017 0421   PROT 6.8 01/15/2017 1134   ALBUMIN 2.5  (L) 05/09/2017 0421   ALBUMIN 4.3 01/15/2017 1134   AST 18 05/09/2017 0421   ALT 8 (L) 05/09/2017 0421   ALKPHOS 46 05/09/2017 0421   BILITOT 0.5 05/09/2017 0421   BILITOT 0.5 01/15/2017 1134   GFRNONAA >60 05/09/2017 0421   GFRAA >60 05/09/2017 0421   Lipase     Component Value Date/Time   LIPASE 24 04/05/2017 2342       Studies/Results: No results found.  Anti-infectives: Anti-infectives (From admission, onward)   Start     Dose/Rate Route Frequency Ordered Stop   05/02/17 0030  azithromycin (ZITHROMAX) 500 mg in dextrose 5 % 250 mL IVPB  Status:  Discontinued     500 mg 250 mL/hr over 60 Minutes Intravenous Daily at bedtime 05/01/17 2351 05/03/17 1315   05/01/17 0000  piperacillin-tazobactam (ZOSYN) IVPB 3.375 g     3.375 g 12.5 mL/hr over 240 Minutes Intravenous Every 8 hours 04/30/17 2213 05/08/17 0423   04/30/17 1730  piperacillin-tazobactam (ZOSYN) IVPB 3.375 g     3.375 g 100 mL/hr over 30 Minutes Intravenous  Once 04/30/17 1728 04/30/17 1823       Assessment/Plan HTN COPD Chronic systolic CHF- EF 57% Malnutrition -onTNA  S/P ex-lap with small bowel resection and left femoral hernia repair with mesh - Dr. Brantley Stage 04/08/17 Post-operative ileus vs SBO -3.0 x 2.5 x 3.2  cmfluid collection/possible abscessvs hematomaon CT 04/30/17, now off antibiotics - Soft diet today. Encourage ambulation. Will reecheck later today to see if he is ready for discharge or if he should be monitored 1 more day.  QAS:UORV diet, Ensure VTE: SCDs,lovenox ID: zosyn 12/25>>1/2;azithromycin 12/27 >>12/28 Foley: None Follow up: [place]    LOS: 10 days    Wellington Hampshire , [facility] Surgery 05/10/2017, 9:22 AM Pager: [phone] Consults: [phone] Mon-Fri 7:00 am-4:30 pm Sat-Sun 7:00 am-11:30 am

## 2017-05-10 NOTE — Care Management Note (Addendum)
Case Management Note  Patient Details  Name: Tommy Turner MRN: [id number] Date of Birth: [birth year]  Subjective/Objective:                    Action/Plan: Orders HHPT/OT received. [facility] aware  Patient from home with wife. Was receiving HHPT/OT through [facility] prior to admission and would like to continue. Paged MD for orders and face to face. Neoma Laming with [facility] following along.   Patient already has home oxygen through [facility] and has portable tank and ride home.  Expected Discharge Date:                  Expected Discharge Plan:  [place]  In-House Referral:     Discharge planning Services  CM Consult  Post Acute Care Choice:  Home Health Choice offered to:  Patient, Spouse  DME Arranged:  N/A DME Agency:  NA  HH Arranged:  OT, PT [place] Agency:  [place]  Status of Service:  In process, will continue to follow  If discussed at Long Length of Stay Meetings, dates discussed:    Additional Comments:  Marilu Favre, RN 05/10/2017, 11:58 AM

## 2017-05-11 LAB — GLUCOSE, CAPILLARY
GLUCOSE-CAPILLARY: 77 mg/dL (ref 65–99)
GLUCOSE-CAPILLARY: 97 mg/dL (ref 65–99)

## 2017-05-11 MED ORDER — HYDROCODONE-ACETAMINOPHEN 5-325 MG PO TABS: 1.0000 | ORAL_TABLET | Freq: Four times a day (QID) | ORAL | 0 refills | Status: DC | PRN

## 2017-05-11 MED ORDER — HYDROCODONE-ACETAMINOPHEN 5-325 MG PO TABS: 1.0000 | ORAL_TABLET | Freq: Four times a day (QID) | ORAL | 0 refills | Status: AC | PRN

## 2017-05-11 MED ORDER — ALUM & MAG HYDROXIDE-SIMETH 200-200-20 MG/5ML PO SUSP
30.0000 mL | ORAL | Status: DC | PRN
  Filled 2017-05-11: qty 30

## 2017-05-11 MED ORDER — ONDANSETRON HCL 4 MG PO TABS: 4.0000 mg | ORAL_TABLET | Freq: Three times a day (TID) | ORAL | 0 refills | Status: DC | PRN

## 2017-05-11 NOTE — Discharge Instructions (Signed)
[facility]      [facility] [phone]  OPEN ABDOMINAL SURGERY: POST OP INSTRUCTIONS  Always review your discharge instruction sheet given to you by the facility where your surgery was performed.  IF YOU HAVE DISABILITY OR FAMILY LEAVE FORMS, YOU MUST BRING THEM TO THE OFFICE FOR PROCESSING.  PLEASE DO NOT GIVE THEM TO YOUR DOCTOR.  1. A prescription for pain medication may be given to you upon discharge.  Take your pain medication as prescribed, if needed.  If narcotic pain medicine is not needed, then you may take acetaminophen (Tylenol) or ibuprofen (Advil) as needed. 2. Take your usually prescribed medications unless otherwise directed. 3. If you need a refill on your pain medication, please contact your pharmacy. They will contact our office to request authorization.  Prescriptions will not be filled after 5pm or on week-ends. 4. You should follow a light diet the first few days after arrival home, such as soup and crackers, pudding, etc.unless your doctor has advised otherwise. A high-fiber, low fat diet can be resumed as tolerated.   Be sure to include lots of fluids daily. Most patients will experience some swelling and bruising on the chest and neck area.  Ice packs will help.  Swelling and bruising can take several days to resolve 5. Most patients will experience some swelling and bruising in the area of the incision. Ice pack will help. Swelling and bruising can take several days to resolve..  6. It is common to experience some constipation if taking pain medication after surgery.  Increasing fluid intake and taking a stool softener will usually help or prevent this problem from occurring.  A mild laxative (Milk of Magnesia or Miralax) should be taken according to package directions if there are no bowel movements after 48 hours. 7.  You may have steri-strips (small skin tapes) in place directly over the incision.  These strips should be left on the skin for 7-10 days.  If your  surgeon used skin glue on the incision, you may shower in 24 hours.  The glue will flake off over the next 2-3 weeks.  Any sutures or staples will be removed at the office during your follow-up visit. You may find that a light gauze bandage over your incision may keep your staples from being rubbed or pulled. You may shower and replace the bandage daily. 8. ACTIVITIES:  You may resume regular (light) daily activities beginning the next day--such as daily self-care, walking, climbing stairs--gradually increasing activities as tolerated.  You may have sexual intercourse when it is comfortable.  Refrain from any heavy lifting or straining until approved by your doctor. a. You may drive when you no longer are taking prescription pain medication, you can comfortably wear a seatbelt, and you can safely maneuver your car and apply brakes b. Return to Work: ___________________________________ 9. You should see your doctor in the office for a follow-up appointment approximately two weeks after your surgery.  Make sure that you call for this appointment within a day or two after you arrive home to insure a convenient appointment time. OTHER INSTRUCTIONS:  _____________________________________________________________ _____________________________________________________________  WHEN TO CALL YOUR DOCTOR: 1. Fever over 101.0 2. Inability to urinate 3. Nausea and/or vomiting 4. Extreme swelling or bruising 5. Continued bleeding from incision. 6. Increased pain, redness, or drainage from the incision. 7. Difficulty swallowing or breathing 8. Muscle cramping or spasms. 9. Numbness or tingling in hands or feet or around lips.  The clinic staff is available to   answer your questions during regular business hours.  Please don't hesitate to call and ask to speak to one of the nurses if you have concerns.  For further questions, please visit [email or web address]   

## 2017-05-11 NOTE — Progress Notes (Signed)
Discharge instructions reviewed with Pt and his wife.  PICC was pulled by IV team prior to his leaving and Pt laid still for 30 minutes after it was pulled, discharge instructions given to patient with prescription.  I had Paged dr. Dalbert Batman to change the location of th Zofran to [facility] in [place],  Did not hear back from him.  Pt will check to see if completed, if not will call office on Monday AM for script - per the wife. Pt left with staff on 2l [place] via wheelchair.  Wife had O2 on the the vehicle for the transport home.

## 2017-05-11 NOTE — Progress Notes (Signed)
Patient ID: Tommy Turner, male   DOB: 19-Nov-1940, 77 y.o.   MRN: [id number]  PROGRESS NOTE    Tommy Turner  NFA:[id number] DOB: 08-06-1940 DOA: 04/30/2017 PCP: [name], MD   Summary:  Patient is a 77 year old male with past medical history significant for chronic systolic congestive heart failure with ejection fraction of 20%, chronic hypoxic respiratory failure on home oxygen, COPD, recent small bowel resection and hernia repair presented with abdominal pain.  [facility] was [facility] from any [facility] to [facility] for small bowel obstruction as well as possible left inguinal abscess.  General surgery was consulted.  General surgery was of the opinion that there was no left inguinal abscess; imaging findings were secondary to postoperative hematoma and fluid.  Patient seen alongside patient's wife. Patient is tolerating regular diet. Surgical team has discharged patient (See DC Summary and orders as per Surgery team).    Assessment & Plan:   Principal Problem:   SBO (small bowel obstruction) (HCC) Active Problems:   COPD (chronic obstructive pulmonary disease) (HCC)   Chronic respiratory failure with hypoxia and hypercapnia (HCC)   Systolic CHF, chronic (HCC)   Severe malnutrition (HCC)   SBO -  Status post repair of a left femoral hernia with mesh as well as an exploratory laparotomy and small bowel resection on 04/08/17. Surgery feels possible left inguinal abscess is a hematoma--further management deferred to surgery. -General surgery following.  NG tube has been removed. Eating soft diet today.  -Off antibiotics. -Likely DC once cleared by Surgery  COPD on home oxygen on 3L; chronic hypoxic resp failure -Stable.  Continue oxygen  Chronic systolic CHF, LVEF 46% 96/2952 -Stable.  Strict input and output.  Continue Lasix  Severe malnutrition  DVT prophylaxis: Lovenox Code Status: DNR  family Communication: Spoke to wife at bedside Disposition Plan: Home once cleared  by general surgery  Consultants: General surgery  Procedures:  Antimicrobials:  Anti-infectives (From admission, onward)   Start     Dose/Rate Route Frequency Ordered Stop   05/02/17 0030  azithromycin (ZITHROMAX) 500 mg in dextrose 5 % 250 mL IVPB  Status:  Discontinued     500 mg 250 mL/hr over 60 Minutes Intravenous Daily at bedtime 05/01/17 2351 05/03/17 1315   05/01/17 0000  piperacillin-tazobactam (ZOSYN) IVPB 3.375 g     3.375 g 12.5 mL/hr over 240 Minutes Intravenous Every 8 hours 04/30/17 2213 05/08/17 0423   04/30/17 1730  piperacillin-tazobactam (ZOSYN) IVPB 3.375 g     3.375 g 100 mL/hr over 30 Minutes Intravenous  Once 04/30/17 1728 04/30/17 1823        Subjective: No new complaints. No bowel movement as yet.  Objective: Vitals:   05/10/17 0458 05/10/17 0500 05/10/17 1510 05/11/17 0455  BP: 109/67  (!) 93/52 (!) 91/59  Pulse: 69  69 69  Resp: 16  16 16   Temp: 98.1 F (36.7 C)  98.9 F (37.2 C) 98.2 F (36.8 C)  TempSrc: Oral  Oral Oral  SpO2: 93%  93% (!) 83%  Weight:  57.5 kg (126 lb 12.2 oz)  57.4 kg (126 lb 8.7 oz)  Height:        Intake/Output Summary (Last 24 hours) at 05/11/2017 1133 Last data filed at 05/11/2017 0115 Gross per 24 hour  Intake -  Output 550 ml  Net -550 ml   Filed Weights   05/09/17 0500 05/10/17 0500 05/11/17 0455  Weight: 56.2 kg (123 lb 14.4 oz) 57.5 kg (126 lb 12.2  oz) 57.4 kg (126 lb 8.7 oz)    Examination:  General exam: Appears calm and comfortable  Respiratory system: Bilateral decreased breath sound at bases Cardiovascular system: S1 & S2 heard, rate controlled.  Gastrointestinal system: Abdomen is nondistended, soft and nontender.  Bowel sounds normoactive.  Extremities: No cyanosis, clubbing, edema   Data Reviewed: I have personally reviewed following labs and imaging studies  CBC: Recent Labs  Lab 05/06/17 0430  WBC 7.7  NEUTROABS 6.0  HGB 10.5*  HCT 34.0*  MCV 96.0  PLT 267   Basic Metabolic  Panel: Recent Labs  Lab 05/05/17 1550 05/06/17 0430 05/07/17 0419 05/09/17 0421  NA 139 138 138 139  K 3.6 3.9 3.8 3.8  CL 100* 100* 98* 101  CO2 37* 35* 35* 35*  GLUCOSE 92 132* 140* 106*  BUN 6 6 12 12   CREATININE 0.56* 0.48* 0.51* 0.32*  CALCIUM 8.2* 8.1* 8.2* 8.3*  MG 1.9 2.1 2.2 2.1  PHOS 2.3* 2.3* 2.7 2.4*   GFR: Estimated Creatinine Clearance: 62.8 mL/min (A) (by C-G formula based on SCr of 0.32 mg/dL (L)). Liver Function Tests: Recent Labs  Lab 05/06/17 0430 05/09/17 0421  AST 13* 18  ALT 7* 8*  ALKPHOS 47 46  BILITOT 0.6 0.5  PROT 5.0* 5.3*  ALBUMIN 2.3* 2.5*   No results for input(s): LIPASE, AMYLASE in the last 168 hours. No results for input(s): AMMONIA in the last 168 hours. Coagulation Profile: No results for input(s): INR, PROTIME in the last 168 hours. Cardiac Enzymes: No results for input(s): CKTOTAL, CKMB, CKMBINDEX, TROPONINI in the last 168 hours. BNP (last 3 results) No results for input(s): PROBNP in the last 8760 hours. HbA1C: No results for input(s): HGBA1C in the last 72 hours. CBG: Recent Labs  Lab 05/10/17 0606 05/10/17 1200 05/10/17 1756 05/11/17 0035 05/11/17 0536  GLUCAP 79 73 91 77 97   Lipid Profile: No results for input(s): CHOL, HDL, LDLCALC, TRIG, CHOLHDL, LDLDIRECT in the last 72 hours. Thyroid Function Tests: No results for input(s): TSH, T4TOTAL, FREET4, T3FREE, THYROIDAB in the last 72 hours. Anemia Panel: No results for input(s): VITAMINB12, FOLATE, FERRITIN, TIBC, IRON, RETICCTPCT in the last 72 hours. Sepsis Labs: No results for input(s): PROCALCITON, LATICACIDVEN in the last 168 hours.  No results found for this or any previous visit (from the past 240 hour(s)).       Radiology Studies: No results found.      Scheduled Meds: . enoxaparin (LOVENOX) injection  40 mg Subcutaneous Q24H  . feeding supplement (ENSURE ENLIVE)  237 mL Oral TID BM  . furosemide  20 mg Intravenous Daily  . insulin aspart   0-9 Units Subcutaneous Q6H  . mouth rinse  15 mL Mouth Rinse BID  . pantoprazole  40 mg Oral QHS  . sodium chloride flush  3 mL Intravenous Q12H   Continuous Infusions: . sodium chloride 250 mL (05/08/17 1900)     LOS: 11 days        Bonnell Public, MD [facility] Pager [phone]  If 7PM-7AM, please contact night-coverage [email or web address] Password [facility] 05/11/2017, 11:33 AM

## 2017-05-11 NOTE — Discharge Summary (Signed)
Patient ID: ZACCAI CHAVARIN [id number] 77 y.o. 1940/11/15  Admit date: 04/30/2017  Discharge date and time: No discharge date for patient encounter.  Admitting Physician: Memon,Jehanzeb  Discharge Physician: Adin Hector  Admission Diagnoses: SBO (small bowel obstruction) ([place]) [K02.542] Postprocedural intraabdominal abscess [T81.49XA]  Discharge Diagnoses: Small bowel obstruction                                         Chronic systolic congestive heart failure with ejection fraction 20%                                         COPD                                          Recent surgery for incarcerated left femoral hernia   including exploratory laparotomy, December 3, Dr. Brantley Stage                                                    Hypertension                     Operations: none  Admission Condition: fair  Discharged Condition: fair  Indication for Admission: This gentleman is status post repair of a left femoral hernia with mesh as well as an exploratory laparotomy and small bowel resection by Dr. Brantley Stage on 12/3.  He was discharged on 12/6 doing well.  He reports he is done well and been eating well and having bowel movements until today.  He started developing abdominal distention with cramping abdominal pain.  He had a normal bowel movement this morning but then had emesis and presented to the emergency department.  A CT scan of the abdomen and pelvis was performed was suggested a possible bowel obstruction so he was transferred to [facility] Course: The patient was transferred to [facility] where he was admitted.  Nasogastric tube was placed.  He was admitted by the internal medicine service.  He was seen in consultation by Dr. Mercy Riding who felt that he had postoperative ileus versus partial SBO.    He was noted to have a hematoma in the left femoral hernia but no obvious infection.    He had an ileus for several days with high NG output.  He started  having bowel movements on December 31 and the NG tube was removed and his diet was advanced.  He slowly improved thereafter.  He was able to advance to a regular diet.  He was having loose stools.  When evaluated on the morning of May 11, 2017 he felt well and asked if he could be discharged home.  He stated he was enjoying his regular diet.  Was passing lots of flatus.  Having some loose stools.  Not having any pain.  His abdomen was soft.  Not tympanitic.  Nontender.  Midline incision clean and healed. He was given was given instructions in diet and activities.    He was given an appointment to see Dr.  Cornett on May 20, 2017 at 1:30 PM. He was given a prescription for Norco for pain but told to use that sparingly.  We called in a prescription for Zofran at his request.       Consults: General surgery, internal medicine  Significant Diagnostic Studies: Lab work and imaging studies  Treatments: IV hydration, antibiotics, nasogastric suction  Disposition: Home  Patient Instructions:  Allergies as of 05/11/2017   No Known Allergies     Medication List    STOP taking these medications   HYDROcodone-acetaminophen 10-325 MG tablet Commonly known as:  NORCO Replaced by:  HYDROcodone-acetaminophen 5-325 MG tablet     TAKE these medications   aspirin EC 81 MG tablet Take 81 mg by mouth daily.   clonazePAM 0.5 MG tablet Commonly known as:  KLONOPIN Take 1 tablet (0.5 mg total) by mouth 2 (two) times daily as needed. for anxiety   HYDROcodone-acetaminophen 5-325 MG tablet Commonly known as:  NORCO Take 1 tablet by mouth every 6 (six) hours as needed for moderate pain or severe pain. Replaces:  HYDROcodone-acetaminophen 10-325 MG tablet   HYDROcodone-acetaminophen 5-325 MG tablet Commonly known as:  NORCO Take 1 tablet by mouth every 6 (six) hours as needed for moderate pain.   HYDROcodone-acetaminophen 5-325 MG tablet Commonly known as:  NORCO Take 1-2 tablets by mouth every  6 (six) hours as needed for moderate pain or severe pain.   metoprolol succinate 25 MG 24 hr tablet Commonly known as:  TOPROL-XL TAKE 1/2 TABLET BY MOUTH 2 TIMES DAILY. What changed:  See the new instructions.   nicotine 14 mg/24hr patch Commonly known as:  NICODERM CQ Place 1 patch (14 mg total) onto the skin daily.   nitroGLYCERIN 0.4 MG SL tablet Commonly known as:  NITROSTAT Place 1 tablet (0.4 mg total) under the tongue every 5 (five) minutes as needed for chest pain.   ondansetron 4 MG tablet Commonly known as:  ZOFRAN Take 1 tablet (4 mg total) by mouth every 8 (eight) hours as needed for nausea or vomiting.   potassium chloride SA 20 MEQ tablet Commonly known as:  K-DUR,KLOR-CON TAKE 1 TABLET BY MOUTH ONCE DAILY What changed:    how much to take  how to take this  when to take this   [place] 18 MCG inhalation capsule Generic drug:  tiotropium PLACE 1 CAPSULE INTO INHALER AND INHALE DAILY   torsemide 20 MG tablet Commonly known as:  DEMADEX Take 1 tablet (20 mg total) by mouth daily. What changed:  when to take this   VENTOLIN HFA 108 (90 Base) MCG/ACT inhaler Generic drug:  albuterol INHALE 2 PUFFS BY MOUTH EVERY 4 HOURS AS NEEDED FOR WHEEZING OR SHORTNESS OF BREATH.       Activity: No driving for 2 weeks.  No sports or heavy lifting for 6 weeks Diet: low fat, low cholesterol diet Wound Care: none needed  Follow-up:  With Dr. Brantley Stage in 2 weeks.  Signed: Edsel Petrin. Dalbert Batman, M.D., FACS General and minimally invasive surgery Breast and Colorectal Surgery  05/11/2017, 9:26 AM

## 2017-05-13 ENCOUNTER — Other Ambulatory Visit: Payer: Self-pay | Admitting: *Deleted

## 2017-05-13 DIAGNOSIS — Z48815 Encounter for surgical aftercare following surgery on the digestive system: Secondary | ICD-10-CM | POA: Diagnosis not present

## 2017-05-13 DIAGNOSIS — I255 Ischemic cardiomyopathy: Secondary | ICD-10-CM | POA: Diagnosis not present

## 2017-05-13 DIAGNOSIS — I5022 Chronic systolic (congestive) heart failure: Secondary | ICD-10-CM | POA: Diagnosis not present

## 2017-05-13 DIAGNOSIS — I2510 Atherosclerotic heart disease of native coronary artery without angina pectoris: Secondary | ICD-10-CM | POA: Diagnosis not present

## 2017-05-13 DIAGNOSIS — J449 Chronic obstructive pulmonary disease, unspecified: Secondary | ICD-10-CM | POA: Diagnosis not present

## 2017-05-13 DIAGNOSIS — J9611 Chronic respiratory failure with hypoxia: Secondary | ICD-10-CM | POA: Diagnosis not present

## 2017-05-13 DIAGNOSIS — G8929 Other chronic pain: Secondary | ICD-10-CM | POA: Diagnosis not present

## 2017-05-13 DIAGNOSIS — J9612 Chronic respiratory failure with hypercapnia: Secondary | ICD-10-CM | POA: Diagnosis not present

## 2017-05-13 DIAGNOSIS — I110 Hypertensive heart disease with heart failure: Secondary | ICD-10-CM | POA: Diagnosis not present

## 2017-05-13 MED FILL — HYDROCODON-APAP 5-325: 5-325 | 7 days supply | Qty: 30 | Fill #0

## 2017-05-13 MED FILL — ONDANSETRON HCL 4 MG TABLET: 4 | 6 days supply | Qty: 20 | Fill #0

## 2017-05-13 NOTE — Patient Outreach (Signed)
[place] [facility]) Care Management  05/13/2017  Tommy Turner [birth year] [id number]   Subjective: Telephone call to patient's home number, no answer, message states mailbox full, and unable to leave a message.   Objective:Per KPN (Knowledge Performance Now, point of care tool) and chart review,patient hospitalized 04/30/17  - 05/11/17 for partial small bowel obstruction, Postprocedural intraabdominal abscess, Chronic systolic congestive heart failure with ejection fraction 20%, COPD, and hypertension.  Patient hospitalized 04/05/17 -04/11/17 forIncarcerated inguinal hernia.Status postExploratory laparotomy with small bowel resection and repair of left femoral hernia with meshon 04/08/17.Patient hospitalized 04/04/16 - 04/17/16 for sepsis due to community acquired pneumonia, Acute on chronic hypoxic respiratory failure (Due to pneumonia and possible COPD exacerbation), Acute encephalopathy metabolic versus medication related. Patient also has a history of hypertension, hyperlipidemia, Abdominal aortic aneurysm, and Chronic systolic CHF (congestive heart failure).  Had ED visit on 11/27/15 for urinary retention. Patient hospitalized 11/24/15 - 11/25/15 for BPH (benign prostatic hypertrophy) with urinary retention. Status post Transurethral resection of the prostate and removal of bladder stones on 11/24/15. Had ED visit on 09/08/15 for urinary retention. [facility] Care Management closed case on 05/09/16 due to unable to contact.      Assessment: Received UMR Transition of care referral on 04/08/17.Transition of care follow up pending patient contact.     Plan:RNCM will call patient for 2nd telephone outreach attempt, transition of care follow up, within 10 business days if no return call.       Reis Pienta H. Annia Friendly, BSN, [place] Management [facility] Telephonic CM Phone: [phone] Fax: [phone]

## 2017-05-14 ENCOUNTER — Ambulatory Visit: Payer: Self-pay | Admitting: *Deleted

## 2017-05-14 ENCOUNTER — Other Ambulatory Visit: Payer: Self-pay | Admitting: *Deleted

## 2017-05-14 NOTE — Patient Outreach (Signed)
[place] [facility]) Care Management  05/14/2017  Tommy Turner 07-18-1940 [id number]   Subjective: Telephone call to patient's home number, no answer, message states mailbox full, and unable to leave a message.   Objective:Per KPN (Knowledge Performance Now, point of care tool) and chart review,patient hospitalized 04/30/17  - 05/11/17 for partial small bowel obstruction, Postprocedural intraabdominal abscess, Chronic systolic congestive heart failure with ejection fraction 20%, COPD, and hypertension.  Patient hospitalized 04/05/17 -04/11/17 forIncarcerated inguinal hernia.Status postExploratory laparotomy with small bowel resection and repair of left femoral hernia with meshon 04/08/17.Patient hospitalized 04/04/16 - 04/17/16 for sepsis due to community acquired pneumonia, Acute on chronic hypoxic respiratory failure (Due to pneumonia and possible COPD exacerbation), Acute encephalopathy metabolic versus medication related. Patient also has a history of hypertension, hyperlipidemia, Abdominal aortic aneurysm, and Chronic systolic CHF (congestive heart failure).  Had ED visit on 11/27/15 for urinary retention. Patient hospitalized 11/24/15 - 11/25/15 for BPH (benign prostatic hypertrophy) with urinary retention. Status post Transurethral resection of the prostate and removal of bladder stones on 11/24/15. Had ED visit on 09/08/15 for urinary retention. [facility] Care Management closed case on 05/09/16 due to unable to contact.      Assessment: Received UMR Transition of care referral on 04/08/17.Transition of care follow up pending patient contact.     Plan:RNCM will call patient for 3rd telephone outreach attempt, transition of care follow up, within 10 business days if no return call.       [name] H. Annia Friendly, BSN, [place] Management [facility] Telephonic CM Phone: [phone] Fax: [phone]

## 2017-05-15 ENCOUNTER — Other Ambulatory Visit: Payer: Self-pay | Admitting: *Deleted

## 2017-05-15 ENCOUNTER — Encounter: Payer: Self-pay | Admitting: *Deleted

## 2017-05-15 ENCOUNTER — Ambulatory Visit: Payer: Self-pay | Admitting: *Deleted

## 2017-05-15 DIAGNOSIS — I110 Hypertensive heart disease with heart failure: Secondary | ICD-10-CM | POA: Diagnosis not present

## 2017-05-15 DIAGNOSIS — I255 Ischemic cardiomyopathy: Secondary | ICD-10-CM | POA: Diagnosis not present

## 2017-05-15 DIAGNOSIS — J9611 Chronic respiratory failure with hypoxia: Secondary | ICD-10-CM | POA: Diagnosis not present

## 2017-05-15 DIAGNOSIS — Z48815 Encounter for surgical aftercare following surgery on the digestive system: Secondary | ICD-10-CM | POA: Diagnosis not present

## 2017-05-15 DIAGNOSIS — G8929 Other chronic pain: Secondary | ICD-10-CM | POA: Diagnosis not present

## 2017-05-15 DIAGNOSIS — I5022 Chronic systolic (congestive) heart failure: Secondary | ICD-10-CM | POA: Diagnosis not present

## 2017-05-15 DIAGNOSIS — I2510 Atherosclerotic heart disease of native coronary artery without angina pectoris: Secondary | ICD-10-CM | POA: Diagnosis not present

## 2017-05-15 DIAGNOSIS — J449 Chronic obstructive pulmonary disease, unspecified: Secondary | ICD-10-CM | POA: Diagnosis not present

## 2017-05-15 DIAGNOSIS — J9612 Chronic respiratory failure with hypercapnia: Secondary | ICD-10-CM | POA: Diagnosis not present

## 2017-05-15 NOTE — Patient Outreach (Signed)
[place] [facility]) Care Management  05/15/2017  JACORIAN GOLASZEWSKI Jun 13, 1940 [id number]    Subjective:Telephone call to patient's mobile number, wife answered phone, states [name] is not available, left a HIPAA compliant message with wife for Mr. On, and requested call back. Telephone call from patient, states he is returning call, and HIPAA verified.  Patient gave verbal consent for RNCM to speak with wife Jamonte Curfman) regarding his healthcare needs as needed.   Spoke with patient's wife, discussed [facility] Care  Management UMR Transition of care follow up, wife voiced understanding, and is in agreement to follow up on patient's behalf.  Wife states patient has a follow up appointment with surgeon on 05/20/17, if no inclement weather, and patient doing much better overall.    States patient is receiving home health services through [employer] and services are going well.   States patient continues to home oxygen (3 liters via nasal cannula), watches for signs/ symptoms of swelling, and monitors weights.  Wife educated regarding patient daily weights, recording weights, voiced understanding, and is in agreement to follow up.  Patient's wife voices understanding of medical diagnosis and treatment plan.  States she is accessing the following [facility] benefits on patient's behalf: outpatient pharmacy, hospital indemnity (not a chosen benefit), and has family medical leave act [employer]) in place.  Wife states patient does not have any education material, transition of care, care coordination, disease management, disease monitoring, transportation, community resource, or pharmacy needs at this time.  States she is very appreciative of the follow up and is in agreement to receive [place] Management information on patient's behalf.     Objective:Per KPN (Knowledge Performance Now, point of care tool) and chart review,patienthospitalized12/25/18 - 05/11/17 forpartial small bowel  obstruction,Postprocedural intraabdominal abscess,Chronic systolic congestive heart failure with ejection fraction 20%, COPD, and hypertension.Patient hospitalized 04/05/17 -04/11/17 forIncarcerated inguinal hernia.Status postExploratory laparotomy with small bowel resection and repair of left femoral hernia with meshon 04/08/17.Patient hospitalized 04/04/16 - 04/17/16 for sepsis due to community acquired pneumonia, Acute on chronic hypoxic respiratory failure (Due to pneumonia and possible COPD exacerbation), Acute encephalopathy metabolic versus medication related. Patient also has a history of hypertension, hyperlipidemia, Abdominal aortic aneurysm, and Chronic systolic CHF (congestive heart failure).  Had ED visit on 11/27/15 for urinary retention. Patient hospitalized 11/24/15 - 11/25/15 for BPH (benign prostatic hypertrophy) with urinary retention. Status post Transurethral resection of the prostate and removal of bladder stones on 11/24/15. Had ED visit on 09/08/15 for urinary retention. [facility] Care Management closed case on 05/09/16 due to unable to contact.      Assessment: Received UMR Transition of care referral on 04/08/17.Transition of care follow up completed, no care management needs, and will proceed with case closure.      Plan:RNCM will send patient successful outreach letter, [facility] pamphlet, and magnet. RNCM will send case closure due to follow up completed / no care management needs request to [place] [facility] [place] [facility].      Trashawn Oquendo H. Annia Friendly, BSN, [place] Management [facility] Telephonic CM Phone: [phone] Fax: [phone]

## 2017-05-16 ENCOUNTER — Telehealth: Payer: Self-pay | Admitting: Family Medicine

## 2017-05-16 DIAGNOSIS — I110 Hypertensive heart disease with heart failure: Secondary | ICD-10-CM | POA: Diagnosis not present

## 2017-05-16 DIAGNOSIS — J9611 Chronic respiratory failure with hypoxia: Secondary | ICD-10-CM | POA: Diagnosis not present

## 2017-05-16 DIAGNOSIS — I5022 Chronic systolic (congestive) heart failure: Secondary | ICD-10-CM | POA: Diagnosis not present

## 2017-05-16 DIAGNOSIS — I255 Ischemic cardiomyopathy: Secondary | ICD-10-CM | POA: Diagnosis not present

## 2017-05-16 DIAGNOSIS — I2510 Atherosclerotic heart disease of native coronary artery without angina pectoris: Secondary | ICD-10-CM | POA: Diagnosis not present

## 2017-05-16 DIAGNOSIS — Z48815 Encounter for surgical aftercare following surgery on the digestive system: Secondary | ICD-10-CM | POA: Diagnosis not present

## 2017-05-16 DIAGNOSIS — J449 Chronic obstructive pulmonary disease, unspecified: Secondary | ICD-10-CM | POA: Diagnosis not present

## 2017-05-16 DIAGNOSIS — G8929 Other chronic pain: Secondary | ICD-10-CM | POA: Diagnosis not present

## 2017-05-16 DIAGNOSIS — J9612 Chronic respiratory failure with hypercapnia: Secondary | ICD-10-CM | POA: Diagnosis not present

## 2017-05-16 NOTE — Telephone Encounter (Signed)
That would be fine 

## 2017-05-16 NOTE — Telephone Encounter (Signed)
Requesting order for OT for 1 time a week for 1 week, 2 times a week for 2 weeks.

## 2017-05-17 ENCOUNTER — Telehealth: Payer: Self-pay | Admitting: *Deleted

## 2017-05-17 DIAGNOSIS — I2510 Atherosclerotic heart disease of native coronary artery without angina pectoris: Secondary | ICD-10-CM | POA: Diagnosis not present

## 2017-05-17 DIAGNOSIS — J9611 Chronic respiratory failure with hypoxia: Secondary | ICD-10-CM | POA: Diagnosis not present

## 2017-05-17 DIAGNOSIS — G8929 Other chronic pain: Secondary | ICD-10-CM | POA: Diagnosis not present

## 2017-05-17 DIAGNOSIS — I110 Hypertensive heart disease with heart failure: Secondary | ICD-10-CM | POA: Diagnosis not present

## 2017-05-17 DIAGNOSIS — J9612 Chronic respiratory failure with hypercapnia: Secondary | ICD-10-CM | POA: Diagnosis not present

## 2017-05-17 DIAGNOSIS — J449 Chronic obstructive pulmonary disease, unspecified: Secondary | ICD-10-CM

## 2017-05-17 DIAGNOSIS — I951 Orthostatic hypotension: Secondary | ICD-10-CM

## 2017-05-17 DIAGNOSIS — I255 Ischemic cardiomyopathy: Secondary | ICD-10-CM | POA: Diagnosis not present

## 2017-05-17 DIAGNOSIS — Z48815 Encounter for surgical aftercare following surgery on the digestive system: Secondary | ICD-10-CM | POA: Diagnosis not present

## 2017-05-17 DIAGNOSIS — I5022 Chronic systolic (congestive) heart failure: Secondary | ICD-10-CM | POA: Diagnosis not present

## 2017-05-17 NOTE — Telephone Encounter (Signed)
Left message to return call 

## 2017-05-17 NOTE — Telephone Encounter (Signed)
Patient notified he needs follow up office visit with Dr Nicki Reaper with in 2 weeks. Patient's wife stated she will call back next week after checking her work schedule to schedule follow up office visit.

## 2017-05-17 NOTE — Telephone Encounter (Signed)
They will hold off on diuretic we will have home health go out and check blood pressure and put in referral for nursing and if he gets worse go to the ER-nurses please make sure patient does a follow-up office visit with [place] within the next 2 weeks

## 2017-05-17 NOTE — Telephone Encounter (Addendum)
Verbal order given to Jonny Ruiz -OT with [facility] per Dr Bary Leriche orders.

## 2017-05-17 NOTE — Telephone Encounter (Signed)
PT called and stated patient has very low blood pressure and is wheezing currently. Dr Nicki Reaper spoke with home health and advised referral to home health for nurse to assess patient. Referral ordered in Epic.

## 2017-05-18 DIAGNOSIS — I110 Hypertensive heart disease with heart failure: Secondary | ICD-10-CM | POA: Diagnosis not present

## 2017-05-18 DIAGNOSIS — J9612 Chronic respiratory failure with hypercapnia: Secondary | ICD-10-CM | POA: Diagnosis not present

## 2017-05-18 DIAGNOSIS — G8929 Other chronic pain: Secondary | ICD-10-CM | POA: Diagnosis not present

## 2017-05-18 DIAGNOSIS — Z48815 Encounter for surgical aftercare following surgery on the digestive system: Secondary | ICD-10-CM | POA: Diagnosis not present

## 2017-05-18 DIAGNOSIS — I255 Ischemic cardiomyopathy: Secondary | ICD-10-CM | POA: Diagnosis not present

## 2017-05-18 DIAGNOSIS — I5022 Chronic systolic (congestive) heart failure: Secondary | ICD-10-CM | POA: Diagnosis not present

## 2017-05-18 DIAGNOSIS — I2510 Atherosclerotic heart disease of native coronary artery without angina pectoris: Secondary | ICD-10-CM | POA: Diagnosis not present

## 2017-05-18 DIAGNOSIS — J449 Chronic obstructive pulmonary disease, unspecified: Secondary | ICD-10-CM | POA: Diagnosis not present

## 2017-05-18 DIAGNOSIS — J9611 Chronic respiratory failure with hypoxia: Secondary | ICD-10-CM | POA: Diagnosis not present

## 2017-05-21 ENCOUNTER — Encounter: Payer: Self-pay | Admitting: Family Medicine

## 2017-05-21 ENCOUNTER — Telehealth: Payer: Self-pay | Admitting: Family Medicine

## 2017-05-21 ENCOUNTER — Ambulatory Visit (INDEPENDENT_AMBULATORY_CARE_PROVIDER_SITE_OTHER): Payer: 59 | Admitting: Family Medicine

## 2017-05-21 VITALS — BP 110/68 | Temp 98.1°F | Ht 67.0 in | Wt 125.0 lb

## 2017-05-21 DIAGNOSIS — I5022 Chronic systolic (congestive) heart failure: Secondary | ICD-10-CM | POA: Diagnosis not present

## 2017-05-21 DIAGNOSIS — E778 Other disorders of glycoprotein metabolism: Secondary | ICD-10-CM | POA: Diagnosis not present

## 2017-05-21 DIAGNOSIS — R54 Age-related physical debility: Secondary | ICD-10-CM

## 2017-05-21 DIAGNOSIS — J9611 Chronic respiratory failure with hypoxia: Secondary | ICD-10-CM

## 2017-05-21 DIAGNOSIS — I255 Ischemic cardiomyopathy: Secondary | ICD-10-CM | POA: Diagnosis not present

## 2017-05-21 DIAGNOSIS — J449 Chronic obstructive pulmonary disease, unspecified: Secondary | ICD-10-CM | POA: Diagnosis not present

## 2017-05-21 DIAGNOSIS — G8929 Other chronic pain: Secondary | ICD-10-CM | POA: Diagnosis not present

## 2017-05-21 DIAGNOSIS — D638 Anemia in other chronic diseases classified elsewhere: Secondary | ICD-10-CM

## 2017-05-21 DIAGNOSIS — J9612 Chronic respiratory failure with hypercapnia: Secondary | ICD-10-CM

## 2017-05-21 DIAGNOSIS — I110 Hypertensive heart disease with heart failure: Secondary | ICD-10-CM | POA: Diagnosis not present

## 2017-05-21 DIAGNOSIS — I2510 Atherosclerotic heart disease of native coronary artery without angina pectoris: Secondary | ICD-10-CM | POA: Diagnosis not present

## 2017-05-21 DIAGNOSIS — Z48815 Encounter for surgical aftercare following surgery on the digestive system: Secondary | ICD-10-CM | POA: Diagnosis not present

## 2017-05-21 MED ORDER — CLONAZEPAM 0.5 MG PO TABS: 0.5000 mg | ORAL_TABLET | Freq: Two times a day (BID) | ORAL | 5 refills | Status: AC | PRN

## 2017-05-21 MED ORDER — ONDANSETRON HCL 4 MG PO TABS: 4.0000 mg | ORAL_TABLET | Freq: Three times a day (TID) | ORAL | 5 refills | Status: AC | PRN

## 2017-05-21 MED FILL — ONDANSETRON HCL 4 MG TABLET: 4 | 7 days supply | Qty: 20 | Fill #0

## 2017-05-21 MED FILL — clonazePAM 0.5 MG TABS: 0.5 | 30 days supply | Qty: 60 | Fill #0

## 2017-05-21 NOTE — Telephone Encounter (Signed)
Home health will be following patient for the next 3-4 weeks

## 2017-05-21 NOTE — Telephone Encounter (Signed)
[facility] called about patient care about 3 to 4 weeks more following up with him.

## 2017-05-21 NOTE — Progress Notes (Signed)
   Subjective:    Patient ID: Tommy Turner, male    DOB: 02-25-1941, 77 y.o.   MRN: [id number]  HPINausea and weakness for over one month since surgery. Saw surgeon yesterday.  This patient had hernia surgery then he had a complicated stay in the hospital due to small bowel obstruction this caused him to have significant nutritional deficits.  He is still trying to do the best he can at getting back on top of his nutritional issues.  He is not eating much.  Low blood pressure.  His blood pressure is been low.  He is on oxygen.  His breathing has been stable.  Needs refill on klonopin and zofran. He uses this medication intermittently to help with nausea and anxiety. This patient has end-stage COPD with limited life span accordingly  Review of Systems Denies high fever chills sweats denies vomiting bleeding issues denies chest pain tightness does relate some shortness of breath with activity which is understandable   Does have some mild constipation issues related to hydrocodone Objective:   Physical Exam Lungs are clear moves air fair heart regular pulse normal abdomen scaphoid extremities has low muscle mass no edema       Assessment & Plan:  Cachexia End-stage COPD Frailty Patient has already signed DNR Refills on medication were given.  Patient will follow-up in several weeks to recheck his weight lab work ordered to follow-up on anemia as well as metabolic 7 and low protein

## 2017-05-22 DIAGNOSIS — I255 Ischemic cardiomyopathy: Secondary | ICD-10-CM | POA: Diagnosis not present

## 2017-05-22 DIAGNOSIS — I2510 Atherosclerotic heart disease of native coronary artery without angina pectoris: Secondary | ICD-10-CM | POA: Diagnosis not present

## 2017-05-22 DIAGNOSIS — J9611 Chronic respiratory failure with hypoxia: Secondary | ICD-10-CM | POA: Diagnosis not present

## 2017-05-22 DIAGNOSIS — I5022 Chronic systolic (congestive) heart failure: Secondary | ICD-10-CM | POA: Diagnosis not present

## 2017-05-22 DIAGNOSIS — J9612 Chronic respiratory failure with hypercapnia: Secondary | ICD-10-CM | POA: Diagnosis not present

## 2017-05-22 DIAGNOSIS — J449 Chronic obstructive pulmonary disease, unspecified: Secondary | ICD-10-CM | POA: Diagnosis not present

## 2017-05-22 DIAGNOSIS — G8929 Other chronic pain: Secondary | ICD-10-CM | POA: Diagnosis not present

## 2017-05-22 DIAGNOSIS — Z48815 Encounter for surgical aftercare following surgery on the digestive system: Secondary | ICD-10-CM | POA: Diagnosis not present

## 2017-05-22 DIAGNOSIS — I110 Hypertensive heart disease with heart failure: Secondary | ICD-10-CM | POA: Diagnosis not present

## 2017-05-22 NOTE — Telephone Encounter (Signed)
Noted  

## 2017-05-23 DIAGNOSIS — Z48815 Encounter for surgical aftercare following surgery on the digestive system: Secondary | ICD-10-CM | POA: Diagnosis not present

## 2017-05-23 DIAGNOSIS — G8929 Other chronic pain: Secondary | ICD-10-CM | POA: Diagnosis not present

## 2017-05-23 DIAGNOSIS — I110 Hypertensive heart disease with heart failure: Secondary | ICD-10-CM | POA: Diagnosis not present

## 2017-05-23 DIAGNOSIS — J9611 Chronic respiratory failure with hypoxia: Secondary | ICD-10-CM | POA: Diagnosis not present

## 2017-05-23 DIAGNOSIS — I255 Ischemic cardiomyopathy: Secondary | ICD-10-CM | POA: Diagnosis not present

## 2017-05-23 DIAGNOSIS — I2510 Atherosclerotic heart disease of native coronary artery without angina pectoris: Secondary | ICD-10-CM | POA: Diagnosis not present

## 2017-05-23 DIAGNOSIS — J449 Chronic obstructive pulmonary disease, unspecified: Secondary | ICD-10-CM | POA: Diagnosis not present

## 2017-05-23 DIAGNOSIS — I5022 Chronic systolic (congestive) heart failure: Secondary | ICD-10-CM | POA: Diagnosis not present

## 2017-05-23 DIAGNOSIS — J9612 Chronic respiratory failure with hypercapnia: Secondary | ICD-10-CM | POA: Diagnosis not present

## 2017-05-24 DIAGNOSIS — J9611 Chronic respiratory failure with hypoxia: Secondary | ICD-10-CM | POA: Diagnosis not present

## 2017-05-24 DIAGNOSIS — Z48815 Encounter for surgical aftercare following surgery on the digestive system: Secondary | ICD-10-CM | POA: Diagnosis not present

## 2017-05-24 DIAGNOSIS — I110 Hypertensive heart disease with heart failure: Secondary | ICD-10-CM | POA: Diagnosis not present

## 2017-05-24 DIAGNOSIS — J9612 Chronic respiratory failure with hypercapnia: Secondary | ICD-10-CM | POA: Diagnosis not present

## 2017-05-24 DIAGNOSIS — I5022 Chronic systolic (congestive) heart failure: Secondary | ICD-10-CM | POA: Diagnosis not present

## 2017-05-24 DIAGNOSIS — I255 Ischemic cardiomyopathy: Secondary | ICD-10-CM | POA: Diagnosis not present

## 2017-05-24 DIAGNOSIS — I2510 Atherosclerotic heart disease of native coronary artery without angina pectoris: Secondary | ICD-10-CM | POA: Diagnosis not present

## 2017-05-24 DIAGNOSIS — G8929 Other chronic pain: Secondary | ICD-10-CM | POA: Diagnosis not present

## 2017-05-24 DIAGNOSIS — J449 Chronic obstructive pulmonary disease, unspecified: Secondary | ICD-10-CM | POA: Diagnosis not present

## 2017-05-27 DIAGNOSIS — I2510 Atherosclerotic heart disease of native coronary artery without angina pectoris: Secondary | ICD-10-CM | POA: Diagnosis not present

## 2017-05-27 DIAGNOSIS — J9612 Chronic respiratory failure with hypercapnia: Secondary | ICD-10-CM | POA: Diagnosis not present

## 2017-05-27 DIAGNOSIS — J9611 Chronic respiratory failure with hypoxia: Secondary | ICD-10-CM | POA: Diagnosis not present

## 2017-05-27 DIAGNOSIS — J449 Chronic obstructive pulmonary disease, unspecified: Secondary | ICD-10-CM | POA: Diagnosis not present

## 2017-05-27 DIAGNOSIS — Z48815 Encounter for surgical aftercare following surgery on the digestive system: Secondary | ICD-10-CM | POA: Diagnosis not present

## 2017-05-27 DIAGNOSIS — I255 Ischemic cardiomyopathy: Secondary | ICD-10-CM | POA: Diagnosis not present

## 2017-05-27 DIAGNOSIS — G8929 Other chronic pain: Secondary | ICD-10-CM | POA: Diagnosis not present

## 2017-05-27 DIAGNOSIS — I110 Hypertensive heart disease with heart failure: Secondary | ICD-10-CM | POA: Diagnosis not present

## 2017-05-27 DIAGNOSIS — I5022 Chronic systolic (congestive) heart failure: Secondary | ICD-10-CM | POA: Diagnosis not present

## 2017-05-28 DIAGNOSIS — J449 Chronic obstructive pulmonary disease, unspecified: Secondary | ICD-10-CM | POA: Diagnosis not present

## 2017-05-28 DIAGNOSIS — I2510 Atherosclerotic heart disease of native coronary artery without angina pectoris: Secondary | ICD-10-CM | POA: Diagnosis not present

## 2017-05-28 DIAGNOSIS — Z48815 Encounter for surgical aftercare following surgery on the digestive system: Secondary | ICD-10-CM | POA: Diagnosis not present

## 2017-05-28 DIAGNOSIS — I255 Ischemic cardiomyopathy: Secondary | ICD-10-CM | POA: Diagnosis not present

## 2017-05-28 DIAGNOSIS — I5022 Chronic systolic (congestive) heart failure: Secondary | ICD-10-CM | POA: Diagnosis not present

## 2017-05-28 DIAGNOSIS — G8929 Other chronic pain: Secondary | ICD-10-CM | POA: Diagnosis not present

## 2017-05-28 DIAGNOSIS — J9612 Chronic respiratory failure with hypercapnia: Secondary | ICD-10-CM | POA: Diagnosis not present

## 2017-05-28 DIAGNOSIS — J9611 Chronic respiratory failure with hypoxia: Secondary | ICD-10-CM | POA: Diagnosis not present

## 2017-05-28 DIAGNOSIS — I110 Hypertensive heart disease with heart failure: Secondary | ICD-10-CM | POA: Diagnosis not present

## 2017-05-29 DIAGNOSIS — J9612 Chronic respiratory failure with hypercapnia: Secondary | ICD-10-CM | POA: Diagnosis not present

## 2017-05-29 DIAGNOSIS — I5022 Chronic systolic (congestive) heart failure: Secondary | ICD-10-CM | POA: Diagnosis not present

## 2017-05-29 DIAGNOSIS — J449 Chronic obstructive pulmonary disease, unspecified: Secondary | ICD-10-CM | POA: Diagnosis not present

## 2017-05-29 DIAGNOSIS — J9611 Chronic respiratory failure with hypoxia: Secondary | ICD-10-CM | POA: Diagnosis not present

## 2017-05-29 DIAGNOSIS — G8929 Other chronic pain: Secondary | ICD-10-CM | POA: Diagnosis not present

## 2017-05-29 DIAGNOSIS — I255 Ischemic cardiomyopathy: Secondary | ICD-10-CM | POA: Diagnosis not present

## 2017-05-29 DIAGNOSIS — I2510 Atherosclerotic heart disease of native coronary artery without angina pectoris: Secondary | ICD-10-CM | POA: Diagnosis not present

## 2017-05-29 DIAGNOSIS — I110 Hypertensive heart disease with heart failure: Secondary | ICD-10-CM | POA: Diagnosis not present

## 2017-05-29 DIAGNOSIS — Z48815 Encounter for surgical aftercare following surgery on the digestive system: Secondary | ICD-10-CM | POA: Diagnosis not present

## 2017-05-30 DIAGNOSIS — J9611 Chronic respiratory failure with hypoxia: Secondary | ICD-10-CM | POA: Diagnosis not present

## 2017-05-30 DIAGNOSIS — Z48815 Encounter for surgical aftercare following surgery on the digestive system: Secondary | ICD-10-CM | POA: Diagnosis not present

## 2017-05-30 DIAGNOSIS — I2510 Atherosclerotic heart disease of native coronary artery without angina pectoris: Secondary | ICD-10-CM | POA: Diagnosis not present

## 2017-05-30 DIAGNOSIS — G8929 Other chronic pain: Secondary | ICD-10-CM | POA: Diagnosis not present

## 2017-05-30 DIAGNOSIS — J9612 Chronic respiratory failure with hypercapnia: Secondary | ICD-10-CM | POA: Diagnosis not present

## 2017-05-30 DIAGNOSIS — I110 Hypertensive heart disease with heart failure: Secondary | ICD-10-CM | POA: Diagnosis not present

## 2017-05-30 DIAGNOSIS — J449 Chronic obstructive pulmonary disease, unspecified: Secondary | ICD-10-CM | POA: Diagnosis not present

## 2017-05-30 DIAGNOSIS — I255 Ischemic cardiomyopathy: Secondary | ICD-10-CM | POA: Diagnosis not present

## 2017-05-30 DIAGNOSIS — I5022 Chronic systolic (congestive) heart failure: Secondary | ICD-10-CM | POA: Diagnosis not present

## 2017-06-03 DIAGNOSIS — J449 Chronic obstructive pulmonary disease, unspecified: Secondary | ICD-10-CM | POA: Diagnosis not present

## 2017-06-03 MED FILL — POTASSIUM CL ER 20 MEQ TABL: 20 | 90 days supply | Qty: 90 | Fill #1

## 2017-06-04 DIAGNOSIS — D638 Anemia in other chronic diseases classified elsewhere: Secondary | ICD-10-CM | POA: Diagnosis not present

## 2017-06-04 DIAGNOSIS — E778 Other disorders of glycoprotein metabolism: Secondary | ICD-10-CM | POA: Diagnosis not present

## 2017-06-05 ENCOUNTER — Encounter: Payer: Self-pay | Admitting: Family Medicine

## 2017-06-05 LAB — CBC WITH DIFFERENTIAL/PLATELET
BASOS ABS: 0 10*3/uL (ref 0.0–0.2)
Basos: 0 %
EOS (ABSOLUTE): 0 10*3/uL (ref 0.0–0.4)
Eos: 0 %
Hematocrit: 41.6 % (ref 37.5–51.0)
Hemoglobin: 12.9 g/dL — ABNORMAL LOW (ref 13.0–17.7)
Immature Grans (Abs): 0 10*3/uL (ref 0.0–0.1)
Immature Granulocytes: 0 %
LYMPHS ABS: 1.7 10*3/uL (ref 0.7–3.1)
Lymphs: 22 %
MCH: 30.1 pg (ref 26.6–33.0)
MCHC: 31 g/dL — AB (ref 31.5–35.7)
MCV: 97 fL (ref 79–97)
MONOCYTES: 8 %
Monocytes Absolute: 0.6 10*3/uL (ref 0.1–0.9)
Neutrophils Absolute: 5.5 10*3/uL (ref 1.4–7.0)
Neutrophils: 70 %
PLATELETS: 149 10*3/uL — AB (ref 150–379)
RBC: 4.28 x10E6/uL (ref 4.14–5.80)
RDW: 15.4 % (ref 12.3–15.4)
WBC: 7.9 10*3/uL (ref 3.4–10.8)

## 2017-06-05 LAB — COMPREHENSIVE METABOLIC PANEL
ALK PHOS: 85 IU/L (ref 39–117)
ALT: 21 IU/L (ref 0–44)
AST: 28 IU/L (ref 0–40)
Albumin/Globulin Ratio: 1.3 (ref 1.2–2.2)
Albumin: 2.8 g/dL — ABNORMAL LOW (ref 3.5–4.8)
BILIRUBIN TOTAL: 0.3 mg/dL (ref 0.0–1.2)
BUN/Creatinine Ratio: 17 (ref 10–24)
BUN: 9 mg/dL (ref 8–27)
CHLORIDE: 99 mmol/L (ref 96–106)
CO2: 34 mmol/L — AB (ref 20–29)
CREATININE: 0.52 mg/dL — AB (ref 0.76–1.27)
Calcium: 8.3 mg/dL — ABNORMAL LOW (ref 8.6–10.2)
GFR calc Af Amer: 119 mL/min/{1.73_m2} (ref >59)
GFR calc non Af Amer: 103 mL/min/{1.73_m2} (ref >59)
GLUCOSE: 97 mg/dL (ref 65–99)
Globulin, Total: 2.2 g/dL (ref 1.5–4.5)
Potassium: 4 mmol/L (ref 3.5–5.2)
Sodium: 144 mmol/L (ref 134–144)
Total Protein: 5 g/dL — ABNORMAL LOW (ref 6.0–8.5)

## 2017-06-10 ENCOUNTER — Telehealth: Payer: Self-pay | Admitting: Family Medicine

## 2017-06-10 MED ORDER — ONDANSETRON HCL 8 MG PO TABS: ORAL_TABLET | ORAL | 1 refills | Status: AC

## 2017-06-10 NOTE — Telephone Encounter (Signed)
Patient has come down with the stomach bug.  Having nausea, and diarrhea.  It has hit everyone in the household.  Wife [name]) wants to know if Rx for Zofran can be called in.   [facility]

## 2017-06-10 NOTE — Telephone Encounter (Signed)
May use Zofran 8 mg, #12, one 3 times daily as needed nausea, 1 refill, clear liquids and bland foods over the course of the next several days as needed follow-up if progressive troubles, go to ER if severe symptoms such as severe abdominal pain vomiting blood or rectal bleeding

## 2017-06-10 NOTE — Telephone Encounter (Signed)
Pt wife is aware of all and rx has been sent to [facility].

## 2017-06-10 NOTE — Telephone Encounter (Signed)
I spoke with the pt wife and she states the pt started having symptoms yesterday. No fever,no vomiting. Wants zofran called in if possible.Thanks,

## 2017-06-11 ENCOUNTER — Telehealth: Payer: Self-pay | Admitting: Family Medicine

## 2017-06-11 DIAGNOSIS — E8770 Fluid overload, unspecified: Secondary | ICD-10-CM

## 2017-06-11 MED ORDER — TORSEMIDE 20 MG PO TABS: ORAL_TABLET | ORAL | 5 refills | Status: AC

## 2017-06-11 MED FILL — TORSEMIDE 20 MG TABLET: 20 | 30 days supply | Qty: 15 | Fill #0

## 2017-06-11 NOTE — Telephone Encounter (Signed)
Per pt wife states he was short winded yesterday today is better. She states he had been taken off of Torsemide 20 mg about two weeks. She gave him one 20 mg tablet of Torsemide last night ("I was scared not to give it")and he has dropped 10lbs over night.She states he had the upset stomach yesterday,but that is better today. He is able to urinate fine ,no fevers, no other complaints.

## 2017-06-11 NOTE — Telephone Encounter (Signed)
I spoke with pt wife she is aware of all.

## 2017-06-11 NOTE — Telephone Encounter (Signed)
I would recommend trying the following #1 try to do a weight every day and record it #2 I would do the diuretic 1 every other morning to see if you can keep the weight in the same zone.  It would be advisable to check a metabolic 7 in 2 weeks.

## 2017-06-11 NOTE — Telephone Encounter (Signed)
Pt's wife called stating that last night the pt weighted 154 pounds and the wife gave him a fluid pill. This morning he weights 145. Wife states that the pt's feet last night were very swollen. Wife was told to call if he gained weight like this. Please advise.

## 2017-06-11 NOTE — Telephone Encounter (Signed)
I called left a message to r/c, so I can notify the pt that he needs to take the medication(torsemide) Q other am. Also that needs b/w,orders placed already.

## 2017-06-13 MED FILL — VENTOLIN HFA 90 MCG INHALER: 108 (90 BAS | 16 days supply | Qty: 18 | Fill #1

## 2017-06-19 ENCOUNTER — Other Ambulatory Visit: Payer: Self-pay | Admitting: Family Medicine

## 2017-06-19 MED FILL — SPIRIVA 18 MCG CP-HANDIHALE: 18 | 30 days supply | Qty: 30 | Fill #0

## 2017-06-21 ENCOUNTER — Ambulatory Visit: Payer: 59 | Admitting: Family Medicine

## 2017-06-27 ENCOUNTER — Encounter: Payer: Self-pay | Admitting: Family Medicine

## 2017-06-27 ENCOUNTER — Other Ambulatory Visit: Payer: Self-pay | Admitting: Family Medicine

## 2017-06-27 ENCOUNTER — Ambulatory Visit (INDEPENDENT_AMBULATORY_CARE_PROVIDER_SITE_OTHER): Payer: 59 | Admitting: Family Medicine

## 2017-06-27 VITALS — BP 98/64 | Ht 67.0 in | Wt 145.0 lb

## 2017-06-27 DIAGNOSIS — D649 Anemia, unspecified: Secondary | ICD-10-CM

## 2017-06-27 DIAGNOSIS — R600 Localized edema: Secondary | ICD-10-CM

## 2017-06-27 LAB — POCT HEMOGLOBIN: HEMOGLOBIN: 9.8 g/dL — AB (ref 14.1–18.1)

## 2017-06-27 NOTE — Progress Notes (Signed)
   Subjective:    Patient ID: Tommy Turner, male    DOB: 1940-08-18, 77 y.o.   MRN: [id number]  HPIFollow up on weight and anemia.   Left foot swelling. Started a couple of weeks ago.   Results for orders placed or performed in visit on 06/27/17  POCT hemoglobin  Result Value Ref Range   Hemoglobin 9.8 (A) 14.1 - 18.1 g/dL      Review of Systems  Constitutional: Negative for activity change, appetite change and fatigue.  HENT: Negative for congestion and rhinorrhea.   Respiratory: Positive for cough and shortness of breath. Negative for chest tightness.   Cardiovascular: Positive for leg swelling. Negative for chest pain.  Gastrointestinal: Negative for abdominal pain, diarrhea and nausea.  Endocrine: Negative for polydipsia and polyphagia.  Genitourinary: Negative for dysuria and hematuria.  Neurological: Negative for weakness and headaches.  Psychiatric/Behavioral: Negative for confusion and dysphoric mood.       Objective:   Physical Exam  Constitutional: He appears well-nourished. No distress.  HENT:  Head: Normocephalic and atraumatic.  Eyes: Right eye exhibits no discharge. Left eye exhibits no discharge.  Neck: No tracheal deviation present.  Cardiovascular: Normal rate, regular rhythm and normal heart sounds.  No murmur heard. Pulmonary/Chest: Effort normal and breath sounds normal. No respiratory distress. He has no wheezes.  Musculoskeletal: He exhibits edema.  Lymphadenopathy:    He has no cervical adenopathy.  Neurological: He is alert.  Skin: Skin is warm. No rash noted.  Psychiatric: His behavior is normal.  Vitals reviewed.   Severe pedal edema goes all the way up to mid leg bilateral      Assessment & Plan:  Anemia-fingerstick is probably not reliable recent CBC showed a reasonable hemoglobin therefore we will not do up further blood testing at this time  Significant pedal edema increase diuretic-we will repeat lab work again in approximately 4  weeks.  Patient encouraged to eat as best as possible pedal edema is due to severe hypo-proteinemia due to severe COPD and moderate cachexia  Mild cachexia and feebleness-patient is no code  Patient does have DNR Patient has end-stage COPD  Follow-up 3 months

## 2017-06-28 ENCOUNTER — Other Ambulatory Visit: Payer: Self-pay | Admitting: *Deleted

## 2017-06-28 DIAGNOSIS — R600 Localized edema: Secondary | ICD-10-CM

## 2017-06-28 DIAGNOSIS — D649 Anemia, unspecified: Secondary | ICD-10-CM

## 2017-07-04 DIAGNOSIS — J449 Chronic obstructive pulmonary disease, unspecified: Secondary | ICD-10-CM | POA: Diagnosis not present

## 2017-07-05 ENCOUNTER — Encounter (HOSPITAL_COMMUNITY): Payer: Self-pay

## 2017-07-05 ENCOUNTER — Other Ambulatory Visit: Payer: Self-pay

## 2017-07-05 ENCOUNTER — Emergency Department (HOSPITAL_COMMUNITY): Payer: 59

## 2017-07-05 ENCOUNTER — Inpatient Hospital Stay (HOSPITAL_COMMUNITY)
Admission: EM | Admit: 2017-07-05 | Discharge: 2017-07-09 | DRG: 388 | Disposition: A | Discharge status: Home Health | Payer: 59 | Attending: Internal Medicine | Admitting: Internal Medicine

## 2017-07-05 DIAGNOSIS — Z0189 Encounter for other specified special examinations: Secondary | ICD-10-CM

## 2017-07-05 DIAGNOSIS — J449 Chronic obstructive pulmonary disease, unspecified: Secondary | ICD-10-CM | POA: Diagnosis present

## 2017-07-05 DIAGNOSIS — Z66 Do not resuscitate: Secondary | ICD-10-CM | POA: Diagnosis present

## 2017-07-05 DIAGNOSIS — K56609 Unspecified intestinal obstruction, unspecified as to partial versus complete obstruction: Secondary | ICD-10-CM | POA: Diagnosis not present

## 2017-07-05 DIAGNOSIS — I252 Old myocardial infarction: Secondary | ICD-10-CM

## 2017-07-05 DIAGNOSIS — R7303 Prediabetes: Secondary | ICD-10-CM | POA: Diagnosis present

## 2017-07-05 DIAGNOSIS — J90 Pleural effusion, not elsewhere classified: Secondary | ICD-10-CM | POA: Diagnosis not present

## 2017-07-05 DIAGNOSIS — Z8619 Personal history of other infectious and parasitic diseases: Secondary | ICD-10-CM | POA: Diagnosis not present

## 2017-07-05 DIAGNOSIS — Z951 Presence of aortocoronary bypass graft: Secondary | ICD-10-CM

## 2017-07-05 DIAGNOSIS — F419 Anxiety disorder, unspecified: Secondary | ICD-10-CM | POA: Diagnosis present

## 2017-07-05 DIAGNOSIS — F329 Major depressive disorder, single episode, unspecified: Secondary | ICD-10-CM | POA: Diagnosis present

## 2017-07-05 DIAGNOSIS — K5900 Constipation, unspecified: Secondary | ICD-10-CM | POA: Diagnosis present

## 2017-07-05 DIAGNOSIS — I714 Abdominal aortic aneurysm, without rupture, unspecified: Secondary | ICD-10-CM | POA: Diagnosis present

## 2017-07-05 DIAGNOSIS — Z9981 Dependence on supplemental oxygen: Secondary | ICD-10-CM | POA: Diagnosis not present

## 2017-07-05 DIAGNOSIS — Z4682 Encounter for fitting and adjustment of non-vascular catheter: Secondary | ICD-10-CM | POA: Diagnosis not present

## 2017-07-05 DIAGNOSIS — I2510 Atherosclerotic heart disease of native coronary artery without angina pectoris: Secondary | ICD-10-CM | POA: Diagnosis present

## 2017-07-05 DIAGNOSIS — J9612 Chronic respiratory failure with hypercapnia: Secondary | ICD-10-CM | POA: Diagnosis not present

## 2017-07-05 DIAGNOSIS — I509 Heart failure, unspecified: Secondary | ICD-10-CM | POA: Diagnosis not present

## 2017-07-05 DIAGNOSIS — K565 Intestinal adhesions [bands], unspecified as to partial versus complete obstruction: Secondary | ICD-10-CM | POA: Diagnosis not present

## 2017-07-05 DIAGNOSIS — I739 Peripheral vascular disease, unspecified: Secondary | ICD-10-CM | POA: Diagnosis present

## 2017-07-05 DIAGNOSIS — Z87891 Personal history of nicotine dependence: Secondary | ICD-10-CM

## 2017-07-05 DIAGNOSIS — E43 Unspecified severe protein-calorie malnutrition: Secondary | ICD-10-CM

## 2017-07-05 DIAGNOSIS — I110 Hypertensive heart disease with heart failure: Secondary | ICD-10-CM | POA: Diagnosis present

## 2017-07-05 DIAGNOSIS — Z8711 Personal history of peptic ulcer disease: Secondary | ICD-10-CM

## 2017-07-05 DIAGNOSIS — I5022 Chronic systolic (congestive) heart failure: Secondary | ICD-10-CM

## 2017-07-05 DIAGNOSIS — J9611 Chronic respiratory failure with hypoxia: Secondary | ICD-10-CM

## 2017-07-05 DIAGNOSIS — E785 Hyperlipidemia, unspecified: Secondary | ICD-10-CM | POA: Diagnosis present

## 2017-07-05 DIAGNOSIS — R64 Cachexia: Secondary | ICD-10-CM | POA: Diagnosis not present

## 2017-07-05 DIAGNOSIS — Z79899 Other long term (current) drug therapy: Secondary | ICD-10-CM

## 2017-07-05 DIAGNOSIS — E861 Hypovolemia: Secondary | ICD-10-CM | POA: Diagnosis present

## 2017-07-05 DIAGNOSIS — Z6821 Body mass index (BMI) 21.0-21.9, adult: Secondary | ICD-10-CM

## 2017-07-05 DIAGNOSIS — Z7982 Long term (current) use of aspirin: Secondary | ICD-10-CM

## 2017-07-05 LAB — COMPREHENSIVE METABOLIC PANEL
ALT: 11 U/L — ABNORMAL LOW (ref 17–63)
ANION GAP: 8 (ref 5–15)
AST: 16 U/L (ref 15–41)
Albumin: 2.7 g/dL — ABNORMAL LOW (ref 3.5–5.0)
Alkaline Phosphatase: 56 U/L (ref 38–126)
BILIRUBIN TOTAL: 0.6 mg/dL (ref 0.3–1.2)
BUN: 18 mg/dL (ref 6–20)
CALCIUM: 8.8 mg/dL — AB (ref 8.9–10.3)
CO2: 44 mmol/L — ABNORMAL HIGH (ref 22–32)
Chloride: 89 mmol/L — ABNORMAL LOW (ref 101–111)
Creatinine, Ser: 0.45 mg/dL — ABNORMAL LOW (ref 0.61–1.24)
GFR calc Af Amer: >60 mL/min (ref >60)
Glucose, Bld: 118 mg/dL — ABNORMAL HIGH (ref 65–99)
POTASSIUM: 3 mmol/L — AB (ref 3.5–5.1)
Sodium: 141 mmol/L (ref 135–145)
TOTAL PROTEIN: 5.6 g/dL — AB (ref 6.5–8.1)

## 2017-07-05 LAB — URINALYSIS, ROUTINE W REFLEX MICROSCOPIC
BILIRUBIN URINE: NEGATIVE
Bacteria, UA: NONE SEEN
GLUCOSE, UA: NEGATIVE mg/dL
Hgb urine dipstick: NEGATIVE
Ketones, ur: NEGATIVE mg/dL
Leukocytes, UA: NEGATIVE
Nitrite: NEGATIVE
Protein, ur: 30 mg/dL — AB
SPECIFIC GRAVITY, URINE: 1.02 (ref 1.005–1.030)
SQUAMOUS EPITHELIAL / LPF: NONE SEEN
pH: 6 (ref 5.0–8.0)

## 2017-07-05 LAB — POC OCCULT BLOOD, ED: Fecal Occult Bld: NEGATIVE

## 2017-07-05 LAB — CBC WITH DIFFERENTIAL/PLATELET
BASOS ABS: 0 10*3/uL (ref 0.0–0.1)
Basophils Relative: 0 %
EOS PCT: 0 %
Eosinophils Absolute: 0 10*3/uL (ref 0.0–0.7)
HCT: 38.4 % — ABNORMAL LOW (ref 39.0–52.0)
Hemoglobin: 11.6 g/dL — ABNORMAL LOW (ref 13.0–17.0)
LYMPHS PCT: 8 %
Lymphs Abs: 0.7 10*3/uL (ref 0.7–4.0)
MCH: 30.1 pg (ref 26.0–34.0)
MCHC: 30.2 g/dL (ref 30.0–36.0)
MCV: 99.5 fL (ref 78.0–100.0)
Monocytes Absolute: 0.9 10*3/uL (ref 0.1–1.0)
Monocytes Relative: 10 %
NEUTROS ABS: 6.9 10*3/uL (ref 1.7–7.7)
Neutrophils Relative %: 82 %
PLATELETS: 159 10*3/uL (ref 150–400)
RBC: 3.86 MIL/uL — AB (ref 4.22–5.81)
RDW: 14.9 % (ref 11.5–15.5)
WBC: 8.5 10*3/uL (ref 4.0–10.5)

## 2017-07-05 LAB — GLUCOSE, CAPILLARY: Glucose-Capillary: 63 mg/dL — ABNORMAL LOW (ref 65–99)

## 2017-07-05 LAB — MAGNESIUM: Magnesium: 1.9 mg/dL (ref 1.7–2.4)

## 2017-07-05 MED ORDER — SODIUM CHLORIDE 0.9 % IV BOLUS (SEPSIS)
500.0000 mL | Freq: Once | INTRAVENOUS | Status: AC
  Administered 2017-07-05: 500 mL via INTRAVENOUS

## 2017-07-05 MED ORDER — TIOTROPIUM BROMIDE MONOHYDRATE 18 MCG IN CAPS
18.0000 ug | ORAL_CAPSULE | Freq: Every day | RESPIRATORY_TRACT | Status: DC
  Administered 2017-07-06 – 2017-07-09 (×4): 18 ug via RESPIRATORY_TRACT
  Filled 2017-07-05: qty 5

## 2017-07-05 MED ORDER — ONDANSETRON HCL 4 MG/2ML IJ SOLN: 4.0000 mg | Freq: Four times a day (QID) | INTRAMUSCULAR | Status: DC | PRN

## 2017-07-05 MED ORDER — IPRATROPIUM-ALBUTEROL 0.5-2.5 (3) MG/3ML IN SOLN
3.0000 mL | Freq: Three times a day (TID) | RESPIRATORY_TRACT | Status: DC | PRN
  Administered 2017-07-06 – 2017-07-08 (×3): 3 mL via RESPIRATORY_TRACT
  Filled 2017-07-05 (×3): qty 3

## 2017-07-05 MED ORDER — ONDANSETRON HCL 4 MG PO TABS
4.0000 mg | ORAL_TABLET | Freq: Four times a day (QID) | ORAL | Status: DC | PRN
Start: 2017-07-05 — End: 2017-07-09

## 2017-07-05 MED ORDER — MORPHINE SULFATE (PF) 2 MG/ML IV SOLN: 1.0000 mg | INTRAVENOUS | Status: DC | PRN

## 2017-07-05 MED ORDER — ACETAMINOPHEN 650 MG RE SUPP: 650.0000 mg | Freq: Four times a day (QID) | RECTAL | Status: DC | PRN

## 2017-07-05 MED ORDER — SODIUM CHLORIDE 0.9 % IV SOLN
INTRAVENOUS | Status: DC
  Administered 2017-07-05: 09:00:00 via INTRAVENOUS

## 2017-07-05 MED ORDER — MILK AND MOLASSES ENEMA
1.0000 | Freq: Once | RECTAL | Status: AC
  Administered 2017-07-05: 250 mL via RECTAL
  Filled 2017-07-05: qty 250

## 2017-07-05 MED ORDER — POTASSIUM CHLORIDE 10 MEQ/100ML IV SOLN
10.0000 meq | INTRAVENOUS | Status: AC
  Administered 2017-07-05 (×4): 10 meq via INTRAVENOUS
  Filled 2017-07-05 (×4): qty 100

## 2017-07-05 MED ORDER — ALBUTEROL SULFATE (2.5 MG/3ML) 0.083% IN NEBU
2.5000 mg | INHALATION_SOLUTION | Freq: Four times a day (QID) | RESPIRATORY_TRACT | Status: DC | PRN
  Administered 2017-07-06 – 2017-07-08 (×2): 2.5 mg via RESPIRATORY_TRACT
  Filled 2017-07-05 (×3): qty 3

## 2017-07-05 MED ORDER — ALBUTEROL SULFATE HFA 108 (90 BASE) MCG/ACT IN AERS: 2.0000 | INHALATION_SPRAY | RESPIRATORY_TRACT | Status: DC | PRN

## 2017-07-05 MED ORDER — POTASSIUM CHLORIDE IN NACL 40-0.9 MEQ/L-% IV SOLN
INTRAVENOUS | Status: DC
  Administered 2017-07-05 – 2017-07-06 (×2): 75 mL/h via INTRAVENOUS
  Filled 2017-07-05 (×6): qty 1000

## 2017-07-05 MED ORDER — IOPAMIDOL (ISOVUE-300) INJECTION 61%
100.0000 mL | Freq: Once | INTRAVENOUS | Status: AC | PRN
  Administered 2017-07-05: 100 mL via INTRAVENOUS

## 2017-07-05 MED ORDER — ACETAMINOPHEN 325 MG PO TABS
650.0000 mg | ORAL_TABLET | Freq: Four times a day (QID) | ORAL | Status: DC | PRN
Start: 2017-07-05 — End: 2017-07-09

## 2017-07-05 MED ORDER — SODIUM CHLORIDE 0.9 % IV SOLN: INTRAVENOUS | Status: DC

## 2017-07-05 MED ORDER — ENOXAPARIN SODIUM 40 MG/0.4ML ~~LOC~~ SOLN
40.0000 mg | SUBCUTANEOUS | Status: DC
  Administered 2017-07-05 – 2017-07-08 (×4): 40 mg via SUBCUTANEOUS
  Filled 2017-07-05 (×4): qty 0.4

## 2017-07-05 NOTE — ED Provider Notes (Signed)
[facility] EMERGENCY DEPARTMENT Provider Note   CSN: [id number] Arrival date & time: 07/05/17  0818     History   Chief Complaint Chief Complaint  Patient presents with  . Abdominal Pain    HPI Tommy Turner is a 77 y.o. male.  He is here concerned for constipation characterized by no good bowel movements in several days.  Yesterday he passed a small amount of thin, soft stool.  This morning his wife gave him magnesium citrate, without relief.  He takes Colace sporadically.  He typically has a bowel movement daily.  He has been able to eat well, including today.  There is been no fever, chills, nausea, vomiting, weakness or dizziness.  He had an inguinal hernia repair December 4854, complicated by postoperative small bowel obstruction.  This resolved spontaneously, after a period of bowel rest in the hospital.  He recently saw his PCP to follow-up on weight loss, and anemia.  He has generalized weakness, uses a walker, and has been having intermittent swelling in his legs bilaterally.  There are no other known modifying factors. HPI  Past Medical History:  Diagnosis Date  . AAA (abdominal aortic aneurysm) ([place])   . Aneurysm ([place])   . Anxiety    takes Xanax daily as needed  . CAD (coronary artery disease)   . CHF (congestive heart failure) ([place])   . COPD (chronic obstructive pulmonary disease) ([facility])    Albuterol inhaler and neb daily as needed;takes SPiriva daily  . Depression    takes Celexa daily  . History of gastric ulcer    gastric  . History of shingles ~ 2008  . Hyperlipidemia    takes Atorvastatin daily  . Hypertension    takes Metoprolol and Benazepril  daily  . Myocardial infarction ([place]) 1990s X 1  . On home oxygen therapy    "3L; 24/7" (05/01/2017)  . Partial small bowel obstruction ([place]) 04/30/2017  . Peripheral edema    takes Furosemide daily  . Shortness of breath     on oxygen  2 liters continuous    Patient Active Problem List   Diagnosis Date  Noted  . Severe malnutrition ([place]) 05/06/2017  . SBO (small bowel obstruction) ([place]) 04/30/2017  . Incarcerated inguinal hernia, unilateral 04/06/2017  . Incarcerated inguinal hernia 04/06/2017  . Senile purpura ([place]) 06/18/2016  . Cachexia ([place]) 06/18/2016  . Palliative care encounter   . Goals of care, counseling/discussion   . DNR (do not resuscitate) discussion   . Acute encephalopathy 04/07/2016  . Anemia 04/05/2016  . Thrombocytopenia ([place]) 04/05/2016  . Fever 04/05/2016  . Elevated troponin 04/05/2016  . CAP (community acquired pneumonia) 04/04/2016  . Sepsis ([place]) 04/04/2016  . BPH (benign prostatic hypertrophy) with urinary retention 07/01/2015  . Hypotension 05/24/2015  . Systolic CHF, chronic ([place]) 05/24/2015  . Protein-calorie malnutrition, severe 05/04/2015  . Chronic respiratory failure with hypoxia and hypercapnia ([place]) 05/03/2015  . Chronic systolic CHF (congestive heart failure) ([place]) 05/03/2015  . Leg swelling- Bilateral leg / foot 10/21/2014  . Abdominal aortic aneurysm without rupture ([place]) 09/14/2014  . Dyspnea 06/03/2014  . Elevated PSA 03/09/2014  . Peripheral vascular disease, unspecified ([place]) 01/22/2014  . Chronic pain syndrome 10/27/2013  . Hyperglycemia 04/09/2013  . Acute respiratory failure with hypoxia ([place]) 04/09/2013  . COPD with acute exacerbation ([place]) 04/05/2013  . COPD exacerbation ([place]) 04/05/2013  . Loss of weight 03/18/2013  . Early satiety 03/18/2013  . Dysphagia, unspecified(787.20) 03/18/2013  . COPD (chronic obstructive pulmonary  disease) ([place]) 10/31/2012  . Hyperlipemia 10/31/2012  . CAD (coronary artery disease) 10/31/2012  . Anxiety 10/31/2012  . Abdominal aortic aneurysm ([place]) 02/02/2011  . TOBACCO ABUSE 09/20/2009  . PEPTIC ULCER DISEASE 09/20/2009  . Essential hypertension 09/19/2009  . BRONCHITIS, CHRONIC 09/19/2009    Past Surgical History:  Procedure Laterality Date  . ABDOMINAL AORTIC ENDOVASCULAR STENT GRAFT N/A  09/14/2014   Procedure: ABDOMINAL AORTIC ENDOVASCULAR STENT GRAFT;  Surgeon: Conrad Weirton, MD;  Location: [place];  Service: Vascular;  Laterality: N/A;  . BOWEL RESECTION  04/08/2017   Procedure: SMALL BOWEL RESECTION;  Surgeon: Erroll Luna, MD;  Location: [place];  Service: General;;  . CARDIAC CATHETERIZATION  1990s X 1  . CORONARY ARTERY BYPASS GRAFT  1990s   CABG X6  . ESOPHAGOGASTRODUODENOSCOPY    . FEMORAL HERNIA REPAIR Left 04/08/2017   Procedure: HERNIA REPAIR FEMORAL;  Surgeon: Erroll Luna, MD;  Location: [place];  Service: General;  Laterality: Left;  . HERNIA REPAIR Left    inguinal  . INSERTION OF MESH  04/08/2017   Procedure: INSERTION OF MESH;  Surgeon: Erroll Luna, MD;  Location: [place];  Service: General;;  . LAPAROTOMY  04/08/2017   Procedure: EXPLORATORY LAPAROTOMY;  Surgeon: Erroll Luna, MD;  Location: [place];  Service: General;;  . PARTIAL GASTRECTOMY     approx 1990  . TONSILLECTOMY     as a child  . TRANSURETHRAL RESECTION OF PROSTATE N/A 11/24/2015   Procedure: TRANSURETHRAL RESECTION OF THE PROSTATE (TURP);  Surgeon: Irine Seal, MD;  Location: [facility];  Service: Urology;  Laterality: N/A;       Home Medications    Prior to Admission medications   Medication Sig Start Date End Date Taking? Authorizing Provider  aspirin EC 81 MG tablet Take 81 mg by mouth daily.     [provider]  clonazePAM (KLONOPIN) 0.5 MG tablet Take 1 tablet (0.5 mg total) by mouth 2 (two) times daily as needed. for anxiety Patient not taking: Reported on 06/27/2017 05/21/17   [name], MD  HYDROcodone-acetaminophen (NORCO) 5-325 MG tablet Take 1 tablet by mouth every 6 (six) hours as needed for moderate pain or severe pain. Patient not taking: Reported on 06/27/2017 05/11/17   Fanny Skates, MD  HYDROcodone-acetaminophen [facility]) 5-325 MG tablet Take 1 tablet by mouth every 6 (six) hours as needed for moderate pain. Patient not taking: Reported on 06/27/2017 05/11/17    Fanny Skates, MD  HYDROcodone-acetaminophen [facility]) 5-325 MG tablet Take 1-2 tablets by mouth every 6 (six) hours as needed for moderate pain or severe pain. Patient not taking: Reported on 06/27/2017 05/11/17   Fanny Skates, MD  metoprolol succinate (TOPROL-XL) 25 MG 24 hr tablet TAKE 1/2 TABLET BY MOUTH 2 TIMES DAILY. Patient not taking: Reported on 05/21/2017 04/03/16   [name], MD  nicotine (NICODERM CQ) 14 mg/24hr patch Place 1 patch (14 mg total) onto the skin daily. Patient not taking: Reported on 04/05/2017 01/15/17   [name], MD  nitroGLYCERIN (NITROSTAT) 0.4 MG SL tablet Place 1 tablet (0.4 mg total) under the tongue every 5 (five) minutes as needed for chest pain. Patient not taking: Reported on 06/27/2017 08/02/15   [name], MD  ondansetron (ZOFRAN) 4 MG tablet Take 1 tablet (4 mg total) by mouth every 8 (eight) hours as needed for nausea or vomiting. Patient not taking: Reported on 06/27/2017 05/21/17 05/21/18  [name], MD  ondansetron (ZOFRAN) 8 MG tablet One  po TID for N&V Patient not taking: Reported on 06/27/2017 06/10/17   [name], MD  potassium chloride SA (K-DUR,KLOR-CON) 20 MEQ tablet TAKE 1 TABLET BY MOUTH ONCE DAILY Patient not taking: Reported on 05/21/2017 02/26/17   [name], MD  SPIRIVA HANDIHALER 18 MCG inhalation capsule PLACE 1 CAPSULE INTO INHALER AND INHALE DAILY 06/19/17   [name], MD  torsemide (DEMADEX) 20 MG tablet One po Q other am 06/11/17   [name], MD  VENTOLIN HFA 108 (90 Base) MCG/ACT inhaler INHALE 2 PUFFS BY MOUTH EVERY 4 HOURS AS NEEDED FOR WHEEZING OR SHORTNESS OF BREATH. 04/18/17   Mikey Kirschner, MD    Family History Family History  Problem Relation Age of Onset  . Hypertension Brother   . Heart disease Brother        Heart Disease before age 53  . Heart attack Brother   . Cancer Brother   . Cancer Mother   . Diabetes Mother   . Heart disease Mother        Heart Disease before age 71    . Hypertension Mother   . Heart attack Mother   . Cancer Father        prostate  . Hypertension Father   . COPD Sister   . Diabetes Sister   . Heart disease Sister   . Hypertension Sister   . Heart attack Sister   . Colon cancer Neg Hx     Social History Social History   Tobacco Use  . Smoking status: Former Smoker    Packs/day: 0.25    Years: 58.00    Pack years: 14.50    Types: Cigarettes    Last attempt to quit: 11/02/2014    Years since quitting: 2.6  . Smokeless tobacco: Never Used  Substance Use Topics  . Alcohol use: No    Alcohol/week: 0.0 oz  . Drug use: No     Allergies   Patient has no known allergies.   Review of Systems Review of Systems  All other systems reviewed and are negative.    Physical Exam Updated Vital Signs BP 98/61 (BP Location: Right Arm)   Pulse 85   Temp (!) 97.5 F (36.4 C) (Oral)   Resp 18   Ht 5\' 7"  (1.702 m)   Wt 61.2 kg (135 lb)   SpO2 98%   BMI 21.14 kg/m   Physical Exam  Constitutional: He is oriented to person, place, and time. He appears well-developed. He appears ill. No distress.  Frail, elderly  HENT:  Head: Normocephalic and atraumatic.  Right Ear: External ear normal.  Left Ear: External ear normal.  Eyes: Conjunctivae and EOM are normal. Pupils are equal, round, and reactive to light.  Neck: Normal range of motion and phonation normal. Neck supple.  Cardiovascular: Normal rate, regular rhythm and normal heart sounds.  Pulmonary/Chest: Effort normal and breath sounds normal. He exhibits no bony tenderness.  Abdominal: Soft. He exhibits no distension and no mass. There is tenderness (Diffuse, mild). There is no rebound and no guarding.  Genitourinary:  Genitourinary Comments: Normal anus.  No stool in rectum.  No rectal mass.  No visualized or palpable hemorrhoids.  Musculoskeletal: Normal range of motion. He exhibits no edema (3+ bilateral legs, lower legs and above knees.).  Neurological: He is alert  and oriented to person, place, and time. No cranial nerve deficit or sensory deficit. He exhibits normal muscle tone. Coordination normal.  Skin: Skin is  warm, dry and intact.  Psychiatric: He has a normal mood and affect. His behavior is normal. Judgment and thought content normal.  Nursing note and vitals reviewed.    ED Treatments / Results  Labs (all labs ordered are listed, but only abnormal results are displayed) Labs Reviewed  COMPREHENSIVE METABOLIC PANEL - Abnormal; Notable for the following components:      Result Value   Potassium 3.0 (*)    Chloride 89 (*)    CO2 44 (*)    Glucose, Bld 118 (*)    Creatinine, Ser 0.45 (*)    Calcium 8.8 (*)    Total Protein 5.6 (*)    Albumin 2.7 (*)    ALT 11 (*)    All other components within normal limits  CBC WITH DIFFERENTIAL/PLATELET - Abnormal; Notable for the following components:   RBC 3.86 (*)    Hemoglobin 11.6 (*)    HCT 38.4 (*)    All other components within normal limits  URINALYSIS, ROUTINE W REFLEX MICROSCOPIC - Abnormal; Notable for the following components:   Color, Urine AMBER (*)    APPearance HAZY (*)    Protein, ur 30 (*)    All other components within normal limits  POC OCCULT BLOOD, ED    EKG  EKG Interpretation None       Radiology Ct Abdomen Pelvis W Contrast  Result Date: 07/05/2017 CLINICAL DATA:  77 year old male with acute abdominal and pelvic pain. EXAM: CT ABDOMEN AND PELVIS WITH CONTRAST TECHNIQUE: Multidetector CT imaging of the abdomen and pelvis was performed using the standard protocol following bolus administration of intravenous contrast. CONTRAST:  153mL ISOVUE-300 IOPAMIDOL (ISOVUE-300) INJECTION 61% COMPARISON:  05/03/2017 radiographs, 04/30/2017 CT and prior studies FINDINGS: Lower chest: Cardiomegaly, small to moderate loculated LEFT pleural effusion and trace RIGHT pleural effusion again noted. Bibasilar atelectasis/scarring again identified. Hepatobiliary: No hepatic or gallbladder  abnormalities identified. No biliary dilatation. Pancreas: Unremarkable Spleen: Unremarkable Adrenals/Urinary Tract: Nonobstructing bilateral renal calculi and renal cysts are again noted. The adrenal glands are unremarkable. The bladder is not well distended. Stomach/Bowel: Multiple dilated loops of small bowel are identified with collapsed distal small bowel loops compatible with high-grade small bowel obstruction. There is no evidence of pneumoperitoneum. Transition point is appears to lie within the central UPPER pelvis (2:66). No obstructing cause identified. Vascular/Lymphatic: Aortic stent graft noted without definite complicating features. Aortic atherosclerotic calcifications are present. No definite enlarged lymph nodes. Reproductive: Prostate is unremarkable. Other: Diffuse subcutaneous edema noted.  No ascites identified. Musculoskeletal: No acute bony abnormality or suspicious bony lesion. Degenerative changes in the lumbar spine and LEFT hip again noted. IMPRESSION: 1. High-grade small bowel obstruction with transition point appearing to lie within the central UPPER pelvis. No definite obstructing cause in this obstruction may be caused by an adhesion. No evidence of pneumoperitoneum. 2. Aortic stent graft and Aortic Atherosclerosis (ICD10-I70.0). 3. Unchanged cardiomegaly, small to moderate loculated LEFT pleural effusion, trace RIGHT pleural effusion and bibasilar atelectasis/scarring. Electronically Signed   By: Margarette Canada M.D.   On: 07/05/2017 10:51    Procedures Procedures (including critical care time)  Medications Ordered in ED Medications  0.9 %  sodium chloride infusion ( Intravenous New Bag/Given 07/05/17 0929)  sodium chloride 0.9 % bolus 500 mL (0 mLs Intravenous Stopped 07/05/17 1115)  iopamidol (ISOVUE-300) 61 % injection 100 mL (100 mLs Intravenous Contrast Given 07/05/17 1024)     Initial Impression / Assessment and Plan / ED Course  I have reviewed the triage  vital signs and  the nursing notes.  Pertinent labs & imaging results that were available during my care of the patient were reviewed by me and considered in my medical decision making (see chart for details).  Clinical Course as of Jul 05 1121  Fri Jul 05, 2017  1121 CT imaging consistent with high-grade small bowel obstruction secondary to adhesion.  At this time patient is not vomiting.  He and his wife agreed to placement of NG tube, which has been ordered.  They understand he will require admission.  [EW]  1122 Potassium ordered for hypokalemia, 3.0.  [EW]    Clinical Course User Index [EW] Daleen Bo, MD     Patient Vitals for the past 24 hrs:  BP Temp Temp src Pulse Resp SpO2 Height Weight  07/05/17 0945 98/61 - - 85 18 98 % - -  07/05/17 0834 95/77 (!) 97.5 F (36.4 C) Oral 78 20 98 % - -  07/05/17 0831 - - - - - - 5\' 7"  (1.702 m) 61.2 kg (135 lb)    11:23 AM Reevaluation with update and discussion. After initial assessment and treatment, an updated evaluation reveals patient remains fairly comfortable, findings discussed and questions answered; wife present.Daleen Bo   11:20 AM-requested callback from general surgery for assistance with management of small bowel obstruction secondary to adhesion.  The call was returned at 74: 74; Dr. Constance Haw will evaluate the patient later this afternoon.  She would like the hospitalist to admit the patient.  12:01 PM-Consult complete with hospitalist. Patient case explained and discussed.  He agrees to admit patient for further evaluation and treatment. Call ended at 12:12 PM   Final Clinical Impressions(s) / ED Diagnoses   Final diagnoses:  Small bowel obstruction due to adhesions ([place])   Recurrent small bowel obstruction, likely secondary to adhesions.  Patient is nontoxic, without signs of peritonitis.  No active vomiting present.  No stooling for several days.  NG tube placed to decompress upper GI tract.  Potassium ordered for hyperkalemia  associated with use of diuretic medication.  Requires admission for further management.  Pertinent laboratory evaluation includes normal white blood cell count 8.5, hemoglobin slightly low 11.6.  Urinalysis does not indicate infection.  Remainder blood work remarkable for hypokalemia 3.0.  Hypoproteinemia is present, associated with malnourished state.  CT imaging shows recurrent bowel obstruction without other associated pathology.  Nursing Notes Reviewed/ Care Coordinated Applicable Imaging Reviewed Interpretation of Laboratory Data incorporated into ED treatment   Plan: [place]  ED Discharge Orders    None       Daleen Bo, MD 07/05/17 1229

## 2017-07-05 NOTE — ED Triage Notes (Signed)
Pt reports abd pain since early this morning.  Reports has been constipated for the past few days.  Had a small bm yesterday.  Reports had a bowel obstruction this past Jan.

## 2017-07-05 NOTE — H&P (Signed)
TRH H&P   Patient Demographics:    Donielle Radziewicz, is a 77 y.o. male  MRN: [id number]   DOB - [birth year]  Admit Date - 07/05/2017  Outpatient Primary MD for the patient is [name], MD  Referring MD: Dr. Eulis Foster  Outpatient Specialists: [place] [facility]  Patient coming from: Home  Chief Complaint  Patient presents with  . Abdominal Pain      HPI:    Jensen Kilburg  is a 77 y.o. male, with chronic systolic CHF with EF of 62% as per prior echo, peripheral vascular disease, severe protein calorie malnutrition COPD with chronic hypoxic respiratory failure on 2 L home O2, left incarcerated inguinal hernia repair with exploratory laparotomy and small bowel resection on 04/08/2017 followed by small bowel obstruction about 3 weeks later for which she was hospitalized and managed conservatively.  Was found to have a hematoma in the left femoral hernia site without any infection.  Patient was hospitalized for over 10 days with NG decompression and discharged home once improved.  Patient presented to the ED with 2-day history of crampy lower abdominal pain worsened with food intake, occasional nausea but no vomiting.  He also noticed his abdomen to be tense and slightly distended.  Patient reports poor p.o. intake, denies any fever, chills, headache, blurred vision, dizziness, shortness of breath, palpitations, diarrhea or dysuria.  Last bowel movement was 2 days ago.  Denies any recent illness or change in his medications. Reports his symptoms to be similar to the bowel obstruction he had 2 months back.  Course in the ED Vitals were stable except for low normal blood pressure.  Blood work showed hemoglobin of 11.6, potassium of 3, chloride of 89, CO2 44.  CT of the abdomen and pelvis with contrast showed high-grade small bowel obstruction with transition point in the central upper pelvis.   Also showed small to moderate loculated left pleural effusion and trace right pleural effusion. NG tube placed in the ED for decompression after which patient felt better.  Surgery consulted and hospitalist admission requested.     Review of systems:    In addition to the HPI above,  No Fever-chills, No Headache, No changes with Vision or hearing, : Swallowing solid or liquid except for abdominal pain. No Chest pain, Cough or Shortness of Breath, Lower abdominal pain, nausea, no vomiting, constipated No Blood in stool or Urine, No dysuria, No new skin rashes or bruises, No new joints pains-aches,  Generalized weakness, tingling, numbness in any extremity, No recent weight gain or loss, No polyuria, polydypsia or polyphagia, No significant Mental Stressors.     With Past History of the following :    Past Medical History:  Diagnosis Date  . AAA (abdominal aortic aneurysm) ([place])   . Aneurysm ([place])   . Anxiety    takes Xanax daily as needed  . CAD (coronary  artery disease)   . CHF (congestive heart failure) ([place])   . COPD (chronic obstructive pulmonary disease) ([facility])    Albuterol inhaler and neb daily as needed;takes SPiriva daily  . Depression    takes Celexa daily  . History of gastric ulcer    gastric  . History of shingles ~ 2008  . Hyperlipidemia    takes Atorvastatin daily  . Hypertension    takes Metoprolol and Benazepril  daily  . Myocardial infarction ([place]) 1990s X 1  . On home oxygen therapy    "3L; 24/7" (05/01/2017)  . Partial small bowel obstruction ([place]) 04/30/2017  . Peripheral edema    takes Furosemide daily  . Shortness of breath     on oxygen  2 liters continuous      Past Surgical History:  Procedure Laterality Date  . ABDOMINAL AORTIC ENDOVASCULAR STENT GRAFT N/A 09/14/2014   Procedure: ABDOMINAL AORTIC ENDOVASCULAR STENT GRAFT;  Surgeon: Conrad South Renovo, MD;  Location: [place];  Service: Vascular;  Laterality: N/A;  . BOWEL RESECTION  04/08/2017    Procedure: SMALL BOWEL RESECTION;  Surgeon: Erroll Luna, MD;  Location: [place];  Service: General;;  . CARDIAC CATHETERIZATION  1990s X 1  . CORONARY ARTERY BYPASS GRAFT  1990s   CABG X6  . ESOPHAGOGASTRODUODENOSCOPY    . FEMORAL HERNIA REPAIR Left 04/08/2017   Procedure: HERNIA REPAIR FEMORAL;  Surgeon: Erroll Luna, MD;  Location: [place];  Service: General;  Laterality: Left;  . HERNIA REPAIR Left    inguinal  . INSERTION OF MESH  04/08/2017   Procedure: INSERTION OF MESH;  Surgeon: Erroll Luna, MD;  Location: [place];  Service: General;;  . LAPAROTOMY  04/08/2017   Procedure: EXPLORATORY LAPAROTOMY;  Surgeon: Erroll Luna, MD;  Location: [place];  Service: General;;  . PARTIAL GASTRECTOMY     approx 1990  . TONSILLECTOMY     as a child  . TRANSURETHRAL RESECTION OF PROSTATE N/A 11/24/2015   Procedure: TRANSURETHRAL RESECTION OF THE PROSTATE (TURP);  Surgeon: Irine Seal, MD;  Location: [facility];  Service: Urology;  Laterality: N/A;      Social History:     Social History   Tobacco Use  . Smoking status: Former Smoker    Packs/day: 0.25    Years: 58.00    Pack years: 14.50    Types: Cigarettes    Last attempt to quit: 11/02/2014    Years since quitting: 2.6  . Smokeless tobacco: Never Used  Substance Use Topics  . Alcohol use: No    Alcohol/week: 0.0 oz     Lives -home with wife  Mobility -uses walker to ambulate     Family History :     Family History  Problem Relation Age of Onset  . Hypertension Brother   . Heart disease Brother        Heart Disease before age 66  . Heart attack Brother   . Cancer Brother   . Cancer Mother   . Diabetes Mother   . Heart disease Mother        Heart Disease before age 66  . Hypertension Mother   . Heart attack Mother   . Cancer Father        prostate  . Hypertension Father   . COPD Sister   . Diabetes Sister   . Heart disease Sister   . Hypertension Sister   . Heart attack Sister   . Colon cancer Neg Hx  Home Medications:   Prior to Admission medications   Medication Sig Start Date End Date Taking? Authorizing Provider  albuterol (ACCUNEB) 0.63 MG/3ML nebulizer solution Take 1 ampule by nebulization every 6 (six) hours as needed for wheezing.   Yes [provider]  aspirin EC 81 MG tablet Take 81 mg by mouth daily.    Yes [provider]  clonazePAM (KLONOPIN) 0.5 MG tablet Take 1 tablet (0.5 mg total) by mouth 2 (two) times daily as needed. for anxiety 05/21/17  Yes Luking, Elayne Snare, MD  HYDROcodone-acetaminophen (NORCO) 5-325 MG tablet Take 1 tablet by mouth every 6 (six) hours as needed for moderate pain. 05/11/17  Yes Fanny Skates, MD  ondansetron [facility]) 8 MG tablet One po TID for N&V 06/10/17  Yes [name], MD  SPIRIVA HANDIHALER 18 MCG inhalation capsule PLACE 1 CAPSULE INTO INHALER AND INHALE DAILY 06/19/17  Yes [name], MD  torsemide (DEMADEX) 20 MG tablet One po Q other am 06/11/17  Yes Luking, Scott A, MD  VENTOLIN HFA 108 (90 Base) MCG/ACT inhaler INHALE 2 PUFFS BY MOUTH EVERY 4 HOURS AS NEEDED FOR WHEEZING OR SHORTNESS OF BREATH. 04/18/17  Yes Mikey Kirschner, MD  nitroGLYCERIN (NITROSTAT) 0.4 MG SL tablet Place 1 tablet (0.4 mg total) under the tongue every 5 (five) minutes as needed for chest pain. Patient not taking: Reported on 06/27/2017 08/02/15   [name], MD  ondansetron (ZOFRAN) 4 MG tablet Take 1 tablet (4 mg total) by mouth every 8 (eight) hours as needed for nausea or vomiting. 05/21/17 05/21/18  [name], MD  potassium chloride SA (K-DUR,KLOR-CON) 20 MEQ tablet TAKE 1 TABLET BY MOUTH ONCE DAILY Patient not taking: Reported on 05/21/2017 02/26/17   [name], MD     Allergies:    No Known Allergies   Physical Exam:   Vitals  Blood pressure 98/61, pulse 85, temperature (!) 97.5 F (36.4 C), temperature source Oral, resp. rate 18, height 5\' 7"  (1.702 m), weight 61.2 kg (135 lb), SpO2 98 %.   General: Elderly  cachectic male lying in bed appears fatigued, not in distress HEENT: NG in place ,temporal wasting, no pallor, no icterus, dry oral mucosa, supple neck, no cervical lymphadenopathy Chest: Clear to auscultation bilaterally, no added sounds CVS: Normal S1 and S2, no murmurs rubs or gallop GI: Soft, mild distention, nontender, sluggish bowel sounds Musculoskeletal: Warm, no edema, normal skin CNS: Alert and oriented, nonfocal   Data Review:    CBC Recent Labs  Lab 07/05/17 0920  WBC 8.5  HGB 11.6*  HCT 38.4*  PLT 159  MCV 99.5  MCH 30.1  MCHC 30.2  RDW 14.9  LYMPHSABS 0.7  MONOABS 0.9  EOSABS 0.0  BASOSABS 0.0   ------------------------------------------------------------------------------------------------------------------  Chemistries  Recent Labs  Lab 07/05/17 0920  NA 141  K 3.0*  CL 89*  CO2 44*  GLUCOSE 118*  BUN 18  CREATININE 0.45*  CALCIUM 8.8*  AST 16  ALT 11*  ALKPHOS 56  BILITOT 0.6   ------------------------------------------------------------------------------------------------------------------ estimated creatinine clearance is 66.9 mL/min (A) (by C-G formula based on SCr of 0.45 mg/dL (L)). ------------------------------------------------------------------------------------------------------------------ No results for input(s): TSH, T4TOTAL, T3FREE, THYROIDAB in the last 72 hours.  Invalid input(s): FREET3  Coagulation profile No results for input(s): INR, PROTIME in the last 168 hours. ------------------------------------------------------------------------------------------------------------------- No results for input(s): DDIMER in the last 72 hours. -------------------------------------------------------------------------------------------------------------------  Cardiac Enzymes No results for input(s): CKMB, TROPONINI, MYOGLOBIN in the last 168  hours.  Invalid input(s):  CK ------------------------------------------------------------------------------------------------------------------    Component Value Date/Time   BNP 87.0 05/16/2015 0730     ---------------------------------------------------------------------------------------------------------------  Urinalysis    Component Value Date/Time   COLORURINE AMBER (A) 07/05/2017 0937   APPEARANCEUR HAZY (A) 07/05/2017 0937   LABSPEC 1.020 07/05/2017 0937   PHURINE 6.0 07/05/2017 0937   GLUCOSEU NEGATIVE 07/05/2017 0937   HGBUR NEGATIVE 07/05/2017 0937   BILIRUBINUR NEGATIVE 07/05/2017 0937   KETONESUR NEGATIVE 07/05/2017 0937   PROTEINUR 30 (A) 07/05/2017 0937   UROBILINOGEN 1.0 09/10/2014 0919   NITRITE NEGATIVE 07/05/2017 0937   LEUKOCYTESUR NEGATIVE 07/05/2017 0937    ----------------------------------------------------------------------------------------------------------------   Imaging Results:    Ct Abdomen Pelvis W Contrast  Result Date: 07/05/2017 CLINICAL DATA:  77 year old male with acute abdominal and pelvic pain. EXAM: CT ABDOMEN AND PELVIS WITH CONTRAST TECHNIQUE: Multidetector CT imaging of the abdomen and pelvis was performed using the standard protocol following bolus administration of intravenous contrast. CONTRAST:  136mL ISOVUE-300 IOPAMIDOL (ISOVUE-300) INJECTION 61% COMPARISON:  05/03/2017 radiographs, 04/30/2017 CT and prior studies FINDINGS: Lower chest: Cardiomegaly, small to moderate loculated LEFT pleural effusion and trace RIGHT pleural effusion again noted. Bibasilar atelectasis/scarring again identified. Hepatobiliary: No hepatic or gallbladder abnormalities identified. No biliary dilatation. Pancreas: Unremarkable Spleen: Unremarkable Adrenals/Urinary Tract: Nonobstructing bilateral renal calculi and renal cysts are again noted. The adrenal glands are unremarkable. The bladder is not well distended. Stomach/Bowel: Multiple dilated loops of small bowel are identified with  collapsed distal small bowel loops compatible with high-grade small bowel obstruction. There is no evidence of pneumoperitoneum. Transition point is appears to lie within the central UPPER pelvis (2:66). No obstructing cause identified. Vascular/Lymphatic: Aortic stent graft noted without definite complicating features. Aortic atherosclerotic calcifications are present. No definite enlarged lymph nodes. Reproductive: Prostate is unremarkable. Other: Diffuse subcutaneous edema noted.  No ascites identified. Musculoskeletal: No acute bony abnormality or suspicious bony lesion. Degenerative changes in the lumbar spine and LEFT hip again noted. IMPRESSION: 1. High-grade small bowel obstruction with transition point appearing to lie within the central UPPER pelvis. No definite obstructing cause in this obstruction may be caused by an adhesion. No evidence of pneumoperitoneum. 2. Aortic stent graft and Aortic Atherosclerosis (ICD10-I70.0). 3. Unchanged cardiomegaly, small to moderate loculated LEFT pleural effusion, trace RIGHT pleural effusion and bibasilar atelectasis/scarring. Electronically Signed   By: Margarette Canada M.D.   On: 07/05/2017 10:51   Dg Chest Portable 1 View  Result Date: 07/05/2017 CLINICAL DATA:  NG tube placement. EXAM: PORTABLE CHEST 1 VIEW COMPARISON:  05/02/2015. FINDINGS: NG tube noted with tip below left hemidiaphragm. Prior CABG. Cardiomegaly with diffuse bilateral pulmonary interstitial prominence and bilateral pleural effusions consistent with CHF. IMPRESSION: 1.  NG tube noted with tip below left hemidiaphragm. 2. Prior CABG. Severe cardiomegaly. Diffuse prominent bilateral interstitial prominence and bilateral pleural effusions consistent with CHF. Electronically Signed   By: Marcello Moores  Register   On: 07/05/2017 11:54    My personal review of EKG:    Assessment & Plan:    Principal Problem:   Small bowel obstruction due to adhesions ([place]) Admit to MedSurg.  NG tube placed with wall  suction in the ED.  Strict n.p.o.  Serial abdominal exam.  Pain control with low-dose morphine PRN.  Zofran as needed for nausea. IV hydration (gentle given his low EF) with normal saline with potassium supplement to keep K >4.  Also ordered IV potassium.  Check magnesium. Surgery consult appreciated.  Recommends nonoperative management for now and if  symptoms worsen he will need to be transferred to [facility] for surgery given his significantly low EF.   Active Problems:   COPD (chronic obstructive pulmonary disease) (HCC)   Chronic respiratory failure with hypoxia and hypercapnia (HCC) Currently stable.  Continue home O2 (3 L).  Continue home inhaler and add as needed nebs.      Peripheral vascular disease, unspecified ([place])   Abdominal aortic aneurysm without rupture [facility]) Outpatient follow-up.    Protein-calorie malnutrition, severe Severely cachectic.  Wife says that he has actually gained some weight from last hospitalization.  (I reviewed the chart and he has actually gained 9 pounds since his last discharge). Check prealbumin.  Nutrition consult once tolerating p.o.  If small bowel obstruction prolonged he may need TNA.    Systolic CHF, chronic (HCC) EF of 20% as per echo in 2017.  Appears hypovolemic.  Continue gentle IV hydration for now.  Holding all home medications.      DVT Prophylaxis subcu Lovenox  AM Labs Ordered, also please review Full Orders  Family Communication: Admission, patients condition and plan of care including tests being ordered have been discussed with the patient and his wife at bedside  Code Status DNR  Likely DC to home  Condition GUARDED    Consults called: Surgery  Admission status: Inpatient  Time spent in minutes : 55   Abria Vannostrand M.D on 07/05/2017 at 1:24 PM  Between 7am to 7pm - Pager - [phone]. After 7pm go to [email or web address] - password [facility]  [facility] - Office  [phone]

## 2017-07-05 NOTE — Consult Note (Addendum)
[facility] Consult  Reason for Consult: SBO  Referring Physician:  Dr. Eulis Foster   Chief Complaint    Abdominal Pain      Tommy Turner is a 77 y.o. male.  HPI: Tommy Turner is a 77 yo known to me after presenting in 04/2017 with an incarcerated hernia s/p partial reduction at the bedside who needed transfer to [facility] to get his operation due to his EF of 20%.  He underwent an Ex lap, SBR and findings of a femoral hernia, and had a hernia repair with mesh.  He had a SBO post operatively per his report, and since then has been doing fair.  The last several days he has failed to have a good BM. He has had some small caliber BMs but nothing substantial. He looks more frail than in December, and says he has lost weight.  He says he has some lower abdominal cramping but no nausea/vomiting.  .   Past Medical History:  Diagnosis Date  . AAA (abdominal aortic aneurysm) ([place])   . Aneurysm ([place])   . Anxiety    takes Xanax daily as needed  . CAD (coronary artery disease)   . CHF (congestive heart failure) ([place])   . COPD (chronic obstructive pulmonary disease) ([facility])    Albuterol inhaler and neb daily as needed;takes SPiriva daily  . Depression    takes Celexa daily  . History of gastric ulcer    gastric  . History of shingles ~ 2008  . Hyperlipidemia    takes Atorvastatin daily  . Hypertension    takes Metoprolol and Benazepril  daily  . Myocardial infarction ([place]) 1990s X 1  . On home oxygen therapy    "3L; 24/7" (05/01/2017)  . Partial small bowel obstruction ([place]) 04/30/2017  . Peripheral edema    takes Furosemide daily  . Shortness of breath     on oxygen  2 liters continuous    Past Surgical History:  Procedure Laterality Date  . ABDOMINAL AORTIC ENDOVASCULAR STENT GRAFT N/A 09/14/2014   Procedure: ABDOMINAL AORTIC ENDOVASCULAR STENT GRAFT;  Surgeon: Conrad Outlook, MD;  Location: [place];  Service: Vascular;  Laterality: N/A;  . BOWEL RESECTION  04/08/2017    Procedure: SMALL BOWEL RESECTION;  Surgeon: Erroll Luna, MD;  Location: [place];  Service: General;;  . CARDIAC CATHETERIZATION  1990s X 1  . CORONARY ARTERY BYPASS GRAFT  1990s   CABG X6  . ESOPHAGOGASTRODUODENOSCOPY    . FEMORAL HERNIA REPAIR Left 04/08/2017   Procedure: HERNIA REPAIR FEMORAL;  Surgeon: Erroll Luna, MD;  Location: [place];  Service: General;  Laterality: Left;  . HERNIA REPAIR Left    inguinal  . INSERTION OF MESH  04/08/2017   Procedure: INSERTION OF MESH;  Surgeon: Erroll Luna, MD;  Location: [place];  Service: General;;  . LAPAROTOMY  04/08/2017   Procedure: EXPLORATORY LAPAROTOMY;  Surgeon: Erroll Luna, MD;  Location: [place];  Service: General;;  . PARTIAL GASTRECTOMY     approx 1990  . TONSILLECTOMY     as a child  . TRANSURETHRAL RESECTION OF PROSTATE N/A 11/24/2015   Procedure: TRANSURETHRAL RESECTION OF THE PROSTATE (TURP);  Surgeon: Irine Seal, MD;  Location: [facility];  Service: Urology;  Laterality: N/A;    Family History  Problem Relation Age of Onset  . Hypertension Brother   . Heart disease Brother        Heart Disease before age 60  . Heart attack Brother   .  Cancer Brother   . Cancer Mother   . Diabetes Mother   . Heart disease Mother        Heart Disease before age 65  . Hypertension Mother   . Heart attack Mother   . Cancer Father        prostate  . Hypertension Father   . COPD Sister   . Diabetes Sister   . Heart disease Sister   . Hypertension Sister   . Heart attack Sister   . Colon cancer Neg Hx     Social History   Tobacco Use  . Smoking status: Former Smoker    Packs/day: 0.25    Years: 58.00    Pack years: 14.50    Types: Cigarettes    Last attempt to quit: 11/02/2014    Years since quitting: 2.6  . Smokeless tobacco: Never Used  Substance Use Topics  . Alcohol use: No    Alcohol/week: 0.0 oz  . Drug use: No    Medications:  I have reviewed the patient's current medications. Current Facility-Administered  Medications  Medication Dose Route Frequency Provider Last Rate Last Dose  . 0.9 %  sodium chloride infusion   Intravenous Continuous Daleen Bo, MD 125 mL/hr at 07/05/17 [phone]    . milk and molasses enema  1 enema Rectal Once Virl Cagey, MD      . potassium chloride 10 mEq in 100 mL IVPB  10 mEq Intravenous Q1 Hr x 4 Dhungel, Nishant, MD       Current Outpatient Medications  Medication Sig Dispense Refill Last Dose  . albuterol (ACCUNEB) 0.63 MG/3ML nebulizer solution Take 1 ampule by nebulization every 6 (six) hours as needed for wheezing.   07/05/2017 at Unknown time  . aspirin EC 81 MG tablet Take 81 mg by mouth daily.    07/04/2017 at Unknown time  . clonazePAM (KLONOPIN) 0.5 MG tablet Take 1 tablet (0.5 mg total) by mouth 2 (two) times daily as needed. for anxiety 60 tablet 5 Past Month at Unknown time  . HYDROcodone-acetaminophen (NORCO) 5-325 MG tablet Take 1 tablet by mouth every 6 (six) hours as needed for moderate pain. 30 tablet 0 Past Week at Unknown time  . ondansetron (ZOFRAN) 8 MG tablet One po TID for N&V 12 tablet 1 Past Month at Unknown time  . SPIRIVA HANDIHALER 18 MCG inhalation capsule PLACE 1 CAPSULE INTO INHALER AND INHALE DAILY 90 capsule 1 07/04/2017 at Unknown time  . torsemide (DEMADEX) 20 MG tablet One po Q other am 15 tablet 5 07/04/2017 at Unknown time  . VENTOLIN HFA 108 (90 Base) MCG/ACT inhaler INHALE 2 PUFFS BY MOUTH EVERY 4 HOURS AS NEEDED FOR WHEEZING OR SHORTNESS OF BREATH. 18 g 5 07/05/2017 at Unknown time  . nitroGLYCERIN (NITROSTAT) 0.4 MG SL tablet Place 1 tablet (0.4 mg total) under the tongue every 5 (five) minutes as needed for chest pain. (Patient not taking: Reported on 06/27/2017) 100 tablet 0 unknown  . ondansetron (ZOFRAN) 4 MG tablet Take 1 tablet (4 mg total) by mouth every 8 (eight) hours as needed for nausea or vomiting. 20 tablet 5 Not Taking  . potassium chloride SA (K-DUR,KLOR-CON) 20 MEQ tablet TAKE 1 TABLET BY MOUTH ONCE DAILY (Patient  not taking: Reported on 05/21/2017) 90 tablet 3 Not Taking  Scheduled: . milk and molasses  1 enema Rectal Once   Continuous: . sodium chloride 125 mL/hr at 07/05/17 0929  . potassium chloride     Allergies: No  Known Allergies  ROS:  A comprehensive review of systems was negative except for: Gastrointestinal: positive for abdominal pain and constipation  Blood pressure 98/61, pulse 85, temperature (!) 97.5 F (36.4 C), temperature source Oral, resp. rate 18, height _0  (1.702 m), weight 135 lb (61.2 kg), SpO2 98 %. Physical Exam  Constitutional: He is oriented to person, place, and time. He appears malnourished. He appears cachectic.  HENT:  Head: Normocephalic.  Eyes: Pupils are equal, round, and reactive to light.  Neck: Normal range of motion.  Cardiovascular: Normal rate and regular rhythm.  Pulmonary/Chest: Effort normal.  Abdominal: Soft.  Minimally distended, lower abdominal tenderness, no rebound or guarding, midline would healed   Musculoskeletal: He exhibits no edema.  Neurological: He is alert and oriented to person, place, and time.  Skin: Skin is warm and dry.  Psychiatric: Mood, memory, affect and judgment normal.  Vitals reviewed.   Results: Results for orders placed or performed during the hospital encounter of 07/05/17 (from the past 48 hour(s))  Comprehensive metabolic panel     Status: Abnormal   Collection Time: 07/05/17  9:20 AM  Result Value Ref Range   Sodium 141 135 - 145 mmol/L   Potassium 3.0 (L) 3.5 - 5.1 mmol/L   Chloride 89 (L) 101 - 111 mmol/L   CO2 44 (H) 22 - 32 mmol/L   Glucose, Bld 118 (H) 65 - 99 mg/dL   BUN 18 6 - 20 mg/dL   Creatinine, Ser 0.45 (L) 0.61 - 1.24 mg/dL   Calcium 8.8 (L) 8.9 - 10.3 mg/dL   Total Protein 5.6 (L) 6.5 - 8.1 g/dL   Albumin 2.7 (L) 3.5 - 5.0 g/dL   AST 16 15 - 41 U/L   ALT 11 (L) 17 - 63 U/L   Alkaline Phosphatase 56 38 - 126 U/L   Total Bilirubin 0.6 0.3 - 1.2 mg/dL   GFR calc non Af Amer >60 >60  mL/min   GFR calc Af Amer >60 >60 mL/min    Comment: (NOTE) The eGFR has been calculated using the CKD EPI equation. This calculation has not been validated in all clinical situations. eGFR's persistently <60 mL/min signify possible Chronic Kidney Disease.    Anion gap 8 5 - 15    Comment: Performed at [facility], [address] [place], [place] [address]  CBC with Differential     Status: Abnormal   Collection Time: 07/05/17  9:20 AM  Result Value Ref Range   WBC 8.5 4.0 - 10.5 K/uL   RBC 3.86 (L) 4.22 - 5.81 MIL/uL   Hemoglobin 11.6 (L) 13.0 - 17.0 g/dL   HCT 38.4 (L) 39.0 - 52.0 %   MCV 99.5 78.0 - 100.0 fL   MCH 30.1 26.0 - 34.0 pg   MCHC 30.2 30.0 - 36.0 g/dL   RDW 14.9 11.5 - 15.5 %   Platelets 159 150 - 400 K/uL   Neutrophils Relative % 82 %   Neutro Abs 6.9 1.7 - 7.7 K/uL   Lymphocytes Relative 8 %   Lymphs Abs 0.7 0.7 - 4.0 K/uL   Monocytes Relative 10 %   Monocytes Absolute 0.9 0.1 - 1.0 K/uL   Eosinophils Relative 0 %   Eosinophils Absolute 0.0 0.0 - 0.7 K/uL   Basophils Relative 0 %   Basophils Absolute 0.0 0.0 - 0.1 K/uL    Comment: Performed at [facility], [address], [place], [place] [address]  POC occult blood, ED Provider will collect  Status: None   Collection Time: 07/05/17  9:33 AM  Result Value Ref Range   Fecal Occult Bld NEGATIVE NEGATIVE  Urinalysis, Routine w reflex microscopic     Status: Abnormal   Collection Time: 07/05/17  9:37 AM  Result Value Ref Range   Color, Urine AMBER (A) YELLOW    Comment: BIOCHEMICALS MAY BE AFFECTED BY COLOR   APPearance HAZY (A) CLEAR   Specific Gravity, Urine 1.020 1.005 - 1.030   pH 6.0 5.0 - 8.0   Glucose, UA NEGATIVE NEGATIVE mg/dL   Hgb urine dipstick NEGATIVE NEGATIVE   Bilirubin Urine NEGATIVE NEGATIVE   Ketones, ur NEGATIVE NEGATIVE mg/dL   Protein, ur 30 (A) NEGATIVE mg/dL   Nitrite NEGATIVE NEGATIVE   Leukocytes, UA NEGATIVE NEGATIVE   RBC / HPF 0-5 0 - 5 RBC/hpf   WBC, UA 0-5 0 - 5  WBC/hpf   Bacteria, UA NONE SEEN NONE SEEN   Squamous Epithelial / LPF NONE SEEN NONE SEEN   Mucus PRESENT    Hyaline Casts, UA PRESENT     Comment: Performed at [facility], [address] [place], [place] [address]   Personally reviewed the CT scan- Dilated SBO, anastomosis open, mid abdomen some decompressed bowel, significant stool burden, air and stool in vault   Ct Abdomen Pelvis W Contrast  Result Date: 07/05/2017 CLINICAL DATA:  77 year old male with acute abdominal and pelvic pain. EXAM: CT ABDOMEN AND PELVIS WITH CONTRAST TECHNIQUE: Multidetector CT imaging of the abdomen and pelvis was performed using the standard protocol following bolus administration of intravenous contrast. CONTRAST:  155m ISOVUE-300 IOPAMIDOL (ISOVUE-300) INJECTION 61% COMPARISON:  05/03/2017 radiographs, 04/30/2017 CT and prior studies FINDINGS: Lower chest: Cardiomegaly, small to moderate loculated LEFT pleural effusion and trace RIGHT pleural effusion again noted. Bibasilar atelectasis/scarring again identified. Hepatobiliary: No hepatic or gallbladder abnormalities identified. No biliary dilatation. Pancreas: Unremarkable Spleen: Unremarkable Adrenals/Urinary Tract: Nonobstructing bilateral renal calculi and renal cysts are again noted. The adrenal glands are unremarkable. The bladder is not well distended. Stomach/Bowel: Multiple dilated loops of small bowel are identified with collapsed distal small bowel loops compatible with high-grade small bowel obstruction. There is no evidence of pneumoperitoneum. Transition point is appears to lie within the central UPPER pelvis (2:66). No obstructing cause identified. Vascular/Lymphatic: Aortic stent graft noted without definite complicating features. Aortic atherosclerotic calcifications are present. No definite enlarged lymph nodes. Reproductive: Prostate is unremarkable. Other: Diffuse subcutaneous edema noted.  No ascites identified. Musculoskeletal: No acute bony  abnormality or suspicious bony lesion. Degenerative changes in the lumbar spine and LEFT hip again noted. IMPRESSION: 1. High-grade small bowel obstruction with transition point appearing to lie within the central UPPER pelvis. No definite obstructing cause in this obstruction may be caused by an adhesion. No evidence of pneumoperitoneum. 2. Aortic stent graft and Aortic Atherosclerosis (ICD10-I70.0). 3. Unchanged cardiomegaly, small to moderate loculated LEFT pleural effusion, trace RIGHT pleural effusion and bibasilar atelectasis/scarring. Electronically Signed   By: JMargarette CanadaM.D.   On: 07/05/2017 10:51   Dg Chest Portable 1 View  Result Date: 07/05/2017 CLINICAL DATA:  NG tube placement. EXAM: PORTABLE CHEST 1 VIEW COMPARISON:  05/02/2015. FINDINGS: NG tube noted with tip below left hemidiaphragm. Prior CABG. Cardiomegaly with diffuse bilateral pulmonary interstitial prominence and bilateral pleural effusions consistent with CHF. IMPRESSION: 1.  NG tube noted with tip below left hemidiaphragm. 2. Prior CABG. Severe cardiomegaly. Diffuse prominent bilateral interstitial prominence and bilateral pleural effusions consistent with CHF. Electronically Signed   By: TMarcello Moores  Register   On: 07/05/2017 11:54     Assessment & Plan:  Tommy Turner is a 77 y.o. male with a SBO and constipation.  He has a CT finding of SBO but also has significant constipation that is not helping with his other issues. He has mostly dilated loops of bowel but some decompressed bowel. His abdomen is relatively benign.  -NPO, NG in place, IVF -Enema ordered  -NO operative intervention indicated at this time but if his exam worsens or his leukocytosis trends up, will need to get him transferred to [facility] for possible surgery given that he has a EF of 20% and is not an appropriate candidate for [facility]   All questions were answered to the satisfaction of the patient and family.    Virl Cagey 07/05/2017, 1:45 PM

## 2017-07-05 NOTE — Progress Notes (Signed)
1545 Arrived to Dept 300 room 331 from ED

## 2017-07-06 ENCOUNTER — Inpatient Hospital Stay (HOSPITAL_COMMUNITY): Payer: 59

## 2017-07-06 LAB — BASIC METABOLIC PANEL
Anion gap: 10 (ref 5–15)
BUN: 15 mg/dL (ref 6–20)
CHLORIDE: 97 mmol/L — AB (ref 101–111)
CO2: 37 mmol/L — AB (ref 22–32)
Calcium: 9 mg/dL (ref 8.9–10.3)
Creatinine, Ser: 0.42 mg/dL — ABNORMAL LOW (ref 0.61–1.24)
GFR calc Af Amer: >60 mL/min (ref >60)
GFR calc non Af Amer: >60 mL/min (ref >60)
GLUCOSE: 65 mg/dL (ref 65–99)
POTASSIUM: 3.9 mmol/L (ref 3.5–5.1)
Sodium: 144 mmol/L (ref 135–145)

## 2017-07-06 LAB — CBC
HEMATOCRIT: 40.8 % (ref 39.0–52.0)
Hemoglobin: 11.9 g/dL — ABNORMAL LOW (ref 13.0–17.0)
MCH: 29.7 pg (ref 26.0–34.0)
MCHC: 29.2 g/dL — AB (ref 30.0–36.0)
MCV: 101.7 fL — AB (ref 78.0–100.0)
Platelets: 177 10*3/uL (ref 150–400)
RBC: 4.01 MIL/uL — ABNORMAL LOW (ref 4.22–5.81)
RDW: 15.2 % (ref 11.5–15.5)
WBC: 6.5 10*3/uL (ref 4.0–10.5)

## 2017-07-06 LAB — GLUCOSE, CAPILLARY
Glucose-Capillary: 56 mg/dL — ABNORMAL LOW (ref 65–99)
Glucose-Capillary: 70 mg/dL (ref 65–99)

## 2017-07-06 MED ORDER — POTASSIUM CHLORIDE 2 MEQ/ML IV SOLN: INTRAVENOUS | Status: DC

## 2017-07-06 MED ORDER — DEXTROSE 50 % IV SOLN
INTRAVENOUS | Status: AC
  Administered 2017-07-06: 25 mL
  Filled 2017-07-06: qty 50

## 2017-07-06 MED ORDER — KCL IN DEXTROSE-NACL 20-5-0.9 MEQ/L-%-% IV SOLN
INTRAVENOUS | Status: DC
  Administered 2017-07-06 – 2017-07-07 (×3): via INTRAVENOUS

## 2017-07-06 MED ORDER — MILK AND MOLASSES ENEMA
1.0000 | Freq: Once | RECTAL | Status: AC
  Administered 2017-07-06: 250 mL via RECTAL

## 2017-07-06 MED ORDER — FUROSEMIDE 10 MG/ML IJ SOLN
20.0000 mg | Freq: Every day | INTRAMUSCULAR | Status: DC
Start: 2017-07-06 — End: 2017-07-09
  Administered 2017-07-06 – 2017-07-09 (×4): 20 mg via INTRAVENOUS
  Filled 2017-07-06 (×4): qty 2

## 2017-07-06 NOTE — Progress Notes (Signed)
Initial Nutrition Assessment  DOCUMENTATION CODES:  Not applicable  INTERVENTION:  Monitor for diet advancement and add supplement as appropriate.   NUTRITION DIAGNOSIS:  Inadequate oral intake related to inability to eat, altered GI function(SBO) as evidenced by NPO status.   GOAL:  Patient will meet greater than or equal to 90% of their needs  MONITOR:  Diet advancement, Labs, Weight trends, I & O's  REASON FOR ASSESSMENT:  Malnutrition Screening Tool    ASSESSMENT:  77 y/o male PMHx CHf (EF 20%), PVD, PCM, COPD, Chronic resp failure. Recently admitted for repair of L incarcerated inguinal hernia w/ exlap and SBR 12/3. Had SBO 3 weeks later which resolved with conservative management. Patient presents now with 2 days of lower abd pain, worse with PO intake. Also w/ abd distension, nausea, poor intake. Imaging reveals SBO and L pleural effusion. Admitted for management  RD operating remotely on weekends. Patient reviewed due to positive MST.  Per chart, the patient has chronic anorexia. This looks to be have been addressed by PCP at the last couple office visits. Pt had pedal edema attributed to hypoproteinemia and has been frequently told he needs to increase his intake. Patient reports no appetite.   Poor appetite likely related to severe COPD and prolonged periods with minimal intake. His recurrent acute ailments also are contribiting.   Weight wise, the patient does appear to have gained weight since hospitalized in January, however, his PCP noted pedal edema at 2/21 office visit when patient documented as 145 lbs. Suspect fluid is masking true weight. Unable to perform physical exam/exces for edema at this time.   At this time, RD unable to contribute any interventions as patient NPO w/ NGT in to LIS. RD will monitor for diet advancement and order supplements once appropriate.   Physical Exam: Unable to conduct  Labs: BG:55-70, Albumin: 2.7.  Meds: IVF  Recent Labs  Lab  07/05/17 0920 07/06/17 0624  NA 141 144  K 3.0* 3.9  CL 89* 97*  CO2 44* 37*  BUN 18 15  CREATININE 0.45* 0.42*  CALCIUM 8.8* 9.0  MG 1.9  --   GLUCOSE 118* 65   NUTRITION - FOCUSED PHYSICAL EXAM: Unable to assess  Diet Order:  Diet NPO time specified  EDUCATION NEEDS:  No education needs have been identified at this time  Skin:  Skin Assessment: Reviewed RN Assessment  Last BM:  2/27  Height:  Ht Readings from Last 1 Encounters:  07/05/17 5\' 7"  (1.702 m)   Weight:  Wt Readings from Last 1 Encounters:  07/05/17 140 lb 3.4 oz (63.6 kg)   Wt Readings from Last 10 Encounters:  07/05/17 140 lb 3.4 oz (63.6 kg)  06/27/17 145 lb (65.8 kg)  05/21/17 125 lb (56.7 kg)  05/11/17 126 lb 8.7 oz (57.4 kg)  04/11/17 125 lb 3.5 oz (56.8 kg)  01/15/17 127 lb (57.6 kg)  08/17/16 136 lb 8 oz (61.9 kg)  06/18/16 133 lb 4 oz (60.4 kg)  04/23/16 124 lb (56.2 kg)  04/17/16 120 lb 13 oz (54.8 kg)   Ideal Body Weight:  67.27 kg  BMI:  Body mass index is 21.96 kg/m.  Estimated Nutritional Needs:  Kcal:  1900-2100 kcals Protein:  80-95g Pro  Fluid:  1.9-2.1 L fluid  Burtis Junes RD, LDN, CNSC Clinical Nutrition Pager: [phone] 07/06/2017 8:24 AM

## 2017-07-06 NOTE — Progress Notes (Signed)
PROGRESS NOTE                                                                                                                                                                                                             Patient Demographics:    Tommy Turner, is a 77 y.o. male, DOB - 10-25-1940, TUU:[id number]  Admit date - 07/05/2017   Admitting Physician Louellen Molder, MD  Outpatient Primary MD for the patient is [name], MD  LOS - 1  Outpatient Specialists: [facility] surgery  Chief Complaint  Patient presents with  . Abdominal Pain       Brief Narrative   77 year old male with chronic systolic CHF with EF of 79%, peripheral vascular disease, severe protein calorie malnutrition, COPD with chronic hypoxic respiratory failure on home O2 with left incarcerated inguinal hernia repair with exploratory laparotomy and small bowel resection on 12/3 followed by small bowel obstruction 3 weeks later and managed in the hospital conservatively. Patient presenting with 2-3 days of increasing abdominal cramp with poor p.o. intake and nausea.  Found to have high-grade small bowel obstruction.   Subjective:   Denies any abdominal pain and reports feeling better overall.  Passing some gas.   Assessment  & Plan :    Principal Problem:   Small bowel obstruction due to adhesions [facility]) Continue conservative management with NG decompression, n.p.o. and gentle hydration.  Follow-up abdominal x-ray today still shows small bowel obstruction.  Did not receive enema yesterday.  (Although was documented by RN that he did). Keep K >4.  Serial abdominal exam.  Surgery consult appreciated.  Plan on conservative management.  If fails to improve or worsens he will need to be transferred to [facility] given high risk surgery with poor EF.  Active Problems: Active Problems:   COPD (chronic obstructive pulmonary disease) (HCC)   Chronic  respiratory failure with hypoxia and hypercapnia (HCC) Currently stable.  Continue home O2 (3 L).  Continue home inhaler and add as needed nebs.  Hypoglycemia in pre diabetes Currently n.p.o. status.  CBG in 50s this morning, asymptomatic and received D50.  Fluids changed to D5 with NS.  Check A1c.     Peripheral vascular disease, unspecified ([place])   Abdominal aortic aneurysm without rupture [facility]) Outpatient follow-up.    Protein-calorie malnutrition, severe Severely cachectic.  Has actually gained 9 pounds since last hospital discharge.  Nutrition consult.    Systolic CHF, chronic (HCC) EF of 20% as per echo in 2017.    Reduce IV fluid rate to 50 cc/h since noted to have some fluid overload.  Added daily IV Lasix.      Consults called: Surgery     Code Status : DNR  Family Communication  : Wife at bedside  Disposition Plan  : Home once improved  Barriers For Discharge : Active symptoms Consults  : Surgery  Procedures  : CT abdomen  DVT Prophylaxis  :  Lovenox -   Lab Results  Component Value Date   PLT 177 07/06/2017    Antibiotics  :    Anti-infectives (From admission, onward)   None        Objective:   Vitals:   07/05/17 2027 07/06/17 0404 07/06/17 0837 07/06/17 1145  BP: 103/70 96/66    Pulse: 86 83    Resp: 17 18    Temp: 97.7 F (36.5 C) 98.9 F (37.2 C)    TempSrc: Oral Oral    SpO2: 98% 93% 91% 91%  Weight:      Height:        Wt Readings from Last 3 Encounters:  07/05/17 63.6 kg (140 lb 3.4 oz)  06/27/17 65.8 kg (145 lb)  05/21/17 56.7 kg (125 lb)     Intake/Output Summary (Last 24 hours) at 07/06/2017 1157 Last data filed at 07/06/2017 0515 Gross per 24 hour  Intake 1623.33 ml  Output 900 ml  Net 723.33 ml     Physical Exam  Gen: Cachectic elderly male not in distress HEENT: Dry mucosa, temporal wasting, NG in place draining bilious liquid Chest: clear b/l, no added sounds CVS: N S1&S2, no murmurs,  GI: soft,  nondistended, nontender, sluggish bowel sounds Musculoskeletal: warm, no edema     Data Review:    CBC Recent Labs  Lab 07/05/17 0920 07/06/17 0624  WBC 8.5 6.5  HGB 11.6* 11.9*  HCT 38.4* 40.8  PLT 159 177  MCV 99.5 101.7*  MCH 30.1 29.7  MCHC 30.2 29.2*  RDW 14.9 15.2  LYMPHSABS 0.7  --   MONOABS 0.9  --   EOSABS 0.0  --   BASOSABS 0.0  --     Chemistries  Recent Labs  Lab 07/05/17 0920 07/06/17 0624  NA 141 144  K 3.0* 3.9  CL 89* 97*  CO2 44* 37*  GLUCOSE 118* 65  BUN 18 15  CREATININE 0.45* 0.42*  CALCIUM 8.8* 9.0  MG 1.9  --   AST 16  --   ALT 11*  --   ALKPHOS 56  --   BILITOT 0.6  --    ------------------------------------------------------------------------------------------------------------------ No results for input(s): CHOL, HDL, LDLCALC, TRIG, CHOLHDL, LDLDIRECT in the last 72 hours.  Lab Results  Component Value Date   HGBA1C 5.8 (H) 11/12/2013   ------------------------------------------------------------------------------------------------------------------ No results for input(s): TSH, T4TOTAL, T3FREE, THYROIDAB in the last 72 hours.  Invalid input(s): FREET3 ------------------------------------------------------------------------------------------------------------------ No results for input(s): VITAMINB12, FOLATE, FERRITIN, TIBC, IRON, RETICCTPCT in the last 72 hours.  Coagulation profile No results for input(s): INR, PROTIME in the last 168 hours.  No results for input(s): DDIMER in the last 72 hours.  Cardiac Enzymes No results for input(s): CKMB, TROPONINI, MYOGLOBIN in the last 168 hours.  Invalid input(s): CK ------------------------------------------------------------------------------------------------------------------    Component Value Date/Time   BNP 87.0 05/16/2015 0730    Inpatient Medications  Scheduled Meds: . enoxaparin (LOVENOX) injection  40 mg Subcutaneous Q24H  . furosemide  20 mg Intravenous Daily    . milk and molasses  1 enema Rectal Once  . tiotropium  18 mcg Inhalation Daily   Continuous Infusions: . dextrose 5 % and 0.9 % NaCl with KCl 20 mEq/L 50 mL/hr at 07/06/17 1056   PRN Meds:.acetaminophen **OR** acetaminophen, albuterol, ipratropium-albuterol, morphine injection, ondansetron **OR** ondansetron (ZOFRAN) IV  Micro Results No results found for this or any previous visit (from the past 240 hour(s)).  Radiology Reports Ct Abdomen Pelvis W Contrast  Result Date: 07/05/2017 CLINICAL DATA:  77 year old male with acute abdominal and pelvic pain. EXAM: CT ABDOMEN AND PELVIS WITH CONTRAST TECHNIQUE: Multidetector CT imaging of the abdomen and pelvis was performed using the standard protocol following bolus administration of intravenous contrast. CONTRAST:  131mL ISOVUE-300 IOPAMIDOL (ISOVUE-300) INJECTION 61% COMPARISON:  05/03/2017 radiographs, 04/30/2017 CT and prior studies FINDINGS: Lower chest: Cardiomegaly, small to moderate loculated LEFT pleural effusion and trace RIGHT pleural effusion again noted. Bibasilar atelectasis/scarring again identified. Hepatobiliary: No hepatic or gallbladder abnormalities identified. No biliary dilatation. Pancreas: Unremarkable Spleen: Unremarkable Adrenals/Urinary Tract: Nonobstructing bilateral renal calculi and renal cysts are again noted. The adrenal glands are unremarkable. The bladder is not well distended. Stomach/Bowel: Multiple dilated loops of small bowel are identified with collapsed distal small bowel loops compatible with high-grade small bowel obstruction. There is no evidence of pneumoperitoneum. Transition point is appears to lie within the central UPPER pelvis (2:66). No obstructing cause identified. Vascular/Lymphatic: Aortic stent graft noted without definite complicating features. Aortic atherosclerotic calcifications are present. No definite enlarged lymph nodes. Reproductive: Prostate is unremarkable. Other: Diffuse subcutaneous edema  noted.  No ascites identified. Musculoskeletal: No acute bony abnormality or suspicious bony lesion. Degenerative changes in the lumbar spine and LEFT hip again noted. IMPRESSION: 1. High-grade small bowel obstruction with transition point appearing to lie within the central UPPER pelvis. No definite obstructing cause in this obstruction may be caused by an adhesion. No evidence of pneumoperitoneum. 2. Aortic stent graft and Aortic Atherosclerosis (ICD10-I70.0). 3. Unchanged cardiomegaly, small to moderate loculated LEFT pleural effusion, trace RIGHT pleural effusion and bibasilar atelectasis/scarring. Electronically Signed   By: Margarette Canada M.D.   On: 07/05/2017 10:51   Dg Chest Portable 1 View  Result Date: 07/05/2017 CLINICAL DATA:  NG tube placement. EXAM: PORTABLE CHEST 1 VIEW COMPARISON:  05/02/2015. FINDINGS: NG tube noted with tip below left hemidiaphragm. Prior CABG. Cardiomegaly with diffuse bilateral pulmonary interstitial prominence and bilateral pleural effusions consistent with CHF. IMPRESSION: 1.  NG tube noted with tip below left hemidiaphragm. 2. Prior CABG. Severe cardiomegaly. Diffuse prominent bilateral interstitial prominence and bilateral pleural effusions consistent with CHF. Electronically Signed   By: Marcello Moores  Register   On: 07/05/2017 11:54   Dg Abd Portable 2v  Result Date: 07/06/2017 CLINICAL DATA:  Follow-up small bowel obstruction. EXAM: PORTABLE ABDOMEN - 2 VIEW COMPARISON:  CT abdomen 07/05/2017 FINDINGS: Nasogastric tube with the tip above the gastroesophageal junction. Recommend advancing the tube 14 cm. Relative lack of small bowel air consistent with fluid-filled loops of small bowel as seen on CT performed yesterday. There is no evidence of pneumoperitoneum, portal venous gas or pneumatosis. There are no pathologic calcifications along the expected course of the ureters. Bilateral small pleural effusions. Loculated fluid in the right major fissure. Stable cardiomegaly.  Bilateral interstitial prominence. Prior CABG. Diffuse thoracolumbar spondylosis. IMPRESSION: 1. Nasogastric tube with the tip above the gastroesophageal junction. Recommend advancing  the tube 14 cm. 2. Relative lack of small bowel air consistent with dilated fluid-filled loops of small bowel as seen on CT performed yesterday. 3. Cardiomegaly with pulmonary vascular congestion. Electronically Signed   By: Kathreen Devoid   On: 07/06/2017 08:24    Time Spent in minutes  25   Kaula Klenke M.D on 07/06/2017 at 11:57 AM  Between 7am to 7pm - Pager - [phone]  After 7pm go to [email or web address] - password [facility]  [facility] -  Office  [phone]

## 2017-07-06 NOTE — Progress Notes (Signed)
[facility] Progress Note     Subjective: Patient doing fair. Says he has had some flatus. No BM. He DID NOT receive the ENEMA yesterday, despite documentation that he did in the ED.  Overall says no pain or cramping.   Objective: Vital signs in last 24 hours: Temp:  [97.6 F (36.4 C)-98.9 F (37.2 C)] 98.9 F (37.2 C) (03/02 0404) Pulse Rate:  [72-86] 83 (03/02 0404) Resp:  [16-18] 18 (03/02 0404) BP: (88-103)/(44-70) 96/66 (03/02 0404) SpO2:  [91 %-98 %] 91 % (03/02 1145) Weight:  [140 lb 3.4 oz (63.6 kg)] 140 lb 3.4 oz (63.6 kg) (03/01 1601) Last BM Date: 07/03/17  Intake/Output from previous day: 03/01 0701 - 03/02 0700 In: 2123.3 [I.V.:1623.3; IV Piggyback:500] Out: 900 [Urine:300; Emesis/NG output:600] Intake/Output this shift: No intake/output data recorded.  General appearance: alert, cooperative and no distress Resp: normal work breathing GI: soft, non-tender; bowel sounds normal; no masses,  no organomegaly Extremities: extremities normal, atraumatic, no cyanosis or edema  Lab Results:  Recent Labs    07/05/17 0920 07/06/17 0624  WBC 8.5 6.5  HGB 11.6* 11.9*  HCT 38.4* 40.8  PLT 159 177   BMET Recent Labs    07/05/17 0920 07/06/17 0624  NA 141 144  K 3.0* 3.9  CL 89* 97*  CO2 44* 37*  GLUCOSE 118* 65  BUN 18 15  CREATININE 0.45* 0.42*  CALCIUM 8.8* 9.0   PT/INR No results for input(s): LABPROT, INR in the last 72 hours.  Studies/Results: Ct Abdomen Pelvis W Contrast  Result Date: 07/05/2017 CLINICAL DATA:  77 year old male with acute abdominal and pelvic pain. EXAM: CT ABDOMEN AND PELVIS WITH CONTRAST TECHNIQUE: Multidetector CT imaging of the abdomen and pelvis was performed using the standard protocol following bolus administration of intravenous contrast. CONTRAST:  161mL ISOVUE-300 IOPAMIDOL (ISOVUE-300) INJECTION 61% COMPARISON:  05/03/2017 radiographs, 04/30/2017 CT and prior studies FINDINGS: Lower chest: Cardiomegaly,  small to moderate loculated LEFT pleural effusion and trace RIGHT pleural effusion again noted. Bibasilar atelectasis/scarring again identified. Hepatobiliary: No hepatic or gallbladder abnormalities identified. No biliary dilatation. Pancreas: Unremarkable Spleen: Unremarkable Adrenals/Urinary Tract: Nonobstructing bilateral renal calculi and renal cysts are again noted. The adrenal glands are unremarkable. The bladder is not well distended. Stomach/Bowel: Multiple dilated loops of small bowel are identified with collapsed distal small bowel loops compatible with high-grade small bowel obstruction. There is no evidence of pneumoperitoneum. Transition point is appears to lie within the central UPPER pelvis (2:66). No obstructing cause identified. Vascular/Lymphatic: Aortic stent graft noted without definite complicating features. Aortic atherosclerotic calcifications are present. No definite enlarged lymph nodes. Reproductive: Prostate is unremarkable. Other: Diffuse subcutaneous edema noted.  No ascites identified. Musculoskeletal: No acute bony abnormality or suspicious bony lesion. Degenerative changes in the lumbar spine and LEFT hip again noted. IMPRESSION: 1. High-grade small bowel obstruction with transition point appearing to lie within the central UPPER pelvis. No definite obstructing cause in this obstruction may be caused by an adhesion. No evidence of pneumoperitoneum. 2. Aortic stent graft and Aortic Atherosclerosis (ICD10-I70.0). 3. Unchanged cardiomegaly, small to moderate loculated LEFT pleural effusion, trace RIGHT pleural effusion and bibasilar atelectasis/scarring. Electronically Signed   By: Margarette Canada M.D.   On: 07/05/2017 10:51   Dg Chest Portable 1 View  Result Date: 07/05/2017 CLINICAL DATA:  NG tube placement. EXAM: PORTABLE CHEST 1 VIEW COMPARISON:  05/02/2015. FINDINGS: NG tube noted with tip below left hemidiaphragm. Prior CABG. Cardiomegaly with diffuse bilateral pulmonary  interstitial prominence and bilateral  pleural effusions consistent with CHF. IMPRESSION: 1.  NG tube noted with tip below left hemidiaphragm. 2. Prior CABG. Severe cardiomegaly. Diffuse prominent bilateral interstitial prominence and bilateral pleural effusions consistent with CHF. Electronically Signed   By: Marcello Moores  Register   On: 07/05/2017 11:54   Dg Abd Portable 2v  Result Date: 07/06/2017 CLINICAL DATA:  Follow-up small bowel obstruction. EXAM: PORTABLE ABDOMEN - 2 VIEW COMPARISON:  CT abdomen 07/05/2017 FINDINGS: Nasogastric tube with the tip above the gastroesophageal junction. Recommend advancing the tube 14 cm. Relative lack of small bowel air consistent with fluid-filled loops of small bowel as seen on CT performed yesterday. There is no evidence of pneumoperitoneum, portal venous gas or pneumatosis. There are no pathologic calcifications along the expected course of the ureters. Bilateral small pleural effusions. Loculated fluid in the right major fissure. Stable cardiomegaly. Bilateral interstitial prominence. Prior CABG. Diffuse thoracolumbar spondylosis. IMPRESSION: 1. Nasogastric tube with the tip above the gastroesophageal junction. Recommend advancing the tube 14 cm. 2. Relative lack of small bowel air consistent with dilated fluid-filled loops of small bowel as seen on CT performed yesterday. 3. Cardiomegaly with pulmonary vascular congestion. Electronically Signed   By: Kathreen Devoid   On: 07/06/2017 08:24    Anti-infectives: Anti-infectives (From admission, onward)   None      Assessment/Plan: Tommy Turner is a 77 y.o. male with a SBO and constipation. He has no pain and is having flatus. He DID NOT receive the enema yesterday.  -NPO, NG in place, IVF -Enema ordered and have discussed with RN and charge RN  -NO operative intervention indicated at this time but if his exam worsens or his leukocytosis trends up, will need to get him transferred to [facility] for possible surgery given  that he has a EF of 20% and is not an appropriate candidate for [facility]   All questions were answered to the satisfaction of the patient and family.    LOS: 1 day    Virl Cagey 07/06/2017

## 2017-07-06 NOTE — Progress Notes (Signed)
Pt blood sugar was  low this am . He received D50 25 mls IV. Dr Clementeen Graham notified.

## 2017-07-07 ENCOUNTER — Inpatient Hospital Stay (HOSPITAL_COMMUNITY): Payer: 59

## 2017-07-07 LAB — BASIC METABOLIC PANEL
Anion gap: 7 (ref 5–15)
BUN: 14 mg/dL (ref 6–20)
CALCIUM: 8.9 mg/dL (ref 8.9–10.3)
CO2: 37 mmol/L — ABNORMAL HIGH (ref 22–32)
CREATININE: 0.46 mg/dL — AB (ref 0.61–1.24)
Chloride: 98 mmol/L — ABNORMAL LOW (ref 101–111)
GFR calc Af Amer: >60 mL/min (ref >60)
Glucose, Bld: 107 mg/dL — ABNORMAL HIGH (ref 65–99)
POTASSIUM: 3.8 mmol/L (ref 3.5–5.1)
SODIUM: 142 mmol/L (ref 135–145)

## 2017-07-07 LAB — GLUCOSE, CAPILLARY: GLUCOSE-CAPILLARY: 95 mg/dL (ref 65–99)

## 2017-07-07 MED ORDER — ORAL CARE MOUTH RINSE
15.0000 mL | Freq: Two times a day (BID) | OROMUCOSAL | Status: DC
  Administered 2017-07-07 – 2017-07-09 (×4): 15 mL via OROMUCOSAL

## 2017-07-07 MED ORDER — PHENOL 1.4 % MT LIQD
1.0000 | OROMUCOSAL | Status: DC | PRN
  Administered 2017-07-07: 1 via OROMUCOSAL
  Filled 2017-07-07: qty 177

## 2017-07-07 MED ORDER — CHLORHEXIDINE GLUCONATE 0.12 % MT SOLN
15.0000 mL | Freq: Two times a day (BID) | OROMUCOSAL | Status: DC
  Administered 2017-07-07 – 2017-07-09 (×5): 15 mL via OROMUCOSAL
  Filled 2017-07-07 (×5): qty 15

## 2017-07-07 NOTE — Progress Notes (Signed)
[facility] Progress Note     Subjective: Having flatus. Minimal output from NG. Overall feels ok. Worried about having to get the NG back down if fails.   Objective: Vital signs in last 24 hours: Temp:  [98.1 F (36.7 C)-98.2 F (36.8 C)] 98.2 F (36.8 C) (03/03 0451) Pulse Rate:  [72-82] 82 (03/03 0451) Resp:  [16-20] 20 (03/03 0451) BP: (108-112)/(60-73) 112/73 (03/03 0451) SpO2:  [92 %-96 %] 92 % (03/03 0802) Weight:  [137 lb (62.1 kg)] 137 lb (62.1 kg) (03/03 0451) Last BM Date: 07/06/17  Intake/Output from previous day: 03/02 0701 - 03/03 0700 In: 600 [I.V.:600] Out: 425 [Urine:75; Emesis/NG output:350] Intake/Output this shift: Total I/O In: -  Out: 450 [Urine:400; Emesis/NG output:50]  General appearance: alert, cooperative and no distress Resp: normal work breathing GI: soft, non-tender; bowel sounds normal; no masses,  no organomegaly  Lab Results:  Recent Labs    07/05/17 0920 07/06/17 0624  WBC 8.5 6.5  HGB 11.6* 11.9*  HCT 38.4* 40.8  PLT 159 177   BMET Recent Labs    07/06/17 0624 07/07/17 0643  NA 144 142  K 3.9 3.8  CL 97* 98*  CO2 37* 37*  GLUCOSE 65 107*  BUN 15 14  CREATININE 0.42* 0.46*  CALCIUM 9.0 8.9    Paucity of gas in the Xray  Studies/Results: Dg Chest Port 1 View  Result Date: 07/06/2017 CLINICAL DATA:  NG tube placement EXAM: PORTABLE CHEST 1 VIEW COMPARISON:  Abdominal radiograph dated 07/06/2017 at 0700 hours FINDINGS: Enteric tube terminates at the GE junction with its side port in the distal esophagus. Cardiomegaly with mild interstitial edema. Mild patchy bilateral lower lobe opacities, likely atelectasis. Small bilateral pleural effusions. Postsurgical changes related to prior CABG. Median sternotomy. IMPRESSION: Enteric tube terminates at the GE junction with the side port in the distal esophagus. Cardiomegaly with mild interstitial edema and small bilateral pleural effusions. Mild patchy bilateral  lower lobe opacities, likely atelectasis. Electronically Signed   By: Julian Hy M.D.   On: 07/06/2017 18:23   Dg Abd Portable 2v  Result Date: 07/07/2017 CLINICAL DATA:  NG tube, SBO EXAM: PORTABLE ABDOMEN - 2 VIEW COMPARISON:  07/06/2017 FINDINGS: Enteric tube terminates in the gastric antrum. Paucity of bowel gas, without radiographic findings to suggest small bowel obstruction. Thoracoabdominal aortic stent. Numerous surgical clips overlying the lower chest and upper abdomen. IMPRESSION: Enteric tube terminates in the gastric antrum. Electronically Signed   By: Julian Hy M.D.   On: 07/07/2017 07:50   Dg Abd Portable 2v  Result Date: 07/06/2017 CLINICAL DATA:  Follow-up small bowel obstruction. EXAM: PORTABLE ABDOMEN - 2 VIEW COMPARISON:  CT abdomen 07/05/2017 FINDINGS: Nasogastric tube with the tip above the gastroesophageal junction. Recommend advancing the tube 14 cm. Relative lack of small bowel air consistent with fluid-filled loops of small bowel as seen on CT performed yesterday. There is no evidence of pneumoperitoneum, portal venous gas or pneumatosis. There are no pathologic calcifications along the expected course of the ureters. Bilateral small pleural effusions. Loculated fluid in the right major fissure. Stable cardiomegaly. Bilateral interstitial prominence. Prior CABG. Diffuse thoracolumbar spondylosis. IMPRESSION: 1. Nasogastric tube with the tip above the gastroesophageal junction. Recommend advancing the tube 14 cm. 2. Relative lack of small bowel air consistent with dilated fluid-filled loops of small bowel as seen on CT performed yesterday. 3. Cardiomegaly with pulmonary vascular congestion. Electronically Signed   By: Kathreen Devoid   On: 07/06/2017 08:24  Assessment/Plan: Tommy Shin Cliftonis a 77 y.o.malewith a SBO and constipation. He has no pain and is having flatus. Had 2 Bms yesterday, one before and one after enema. Overall feeling better. Does not want to get  NG out too soon because worried about getting it back in.   - NG to gravity, can have sips and chips, and if tolerates will just get NG out tomorrow   Updated Dr. Clementeen Graham.    LOS: 2 days    Virl Cagey 07/07/2017

## 2017-07-07 NOTE — Progress Notes (Signed)
PROGRESS NOTE                                                                                                                                                                                                             Patient Demographics:    Tommy Turner, is a 77 y.o. male, DOB - January 17, 1941, LNL:[id number]  Admit date - 07/05/2017   Admitting Physician Louellen Molder, MD  Outpatient Primary MD for the patient is [name], MD  LOS - 2  Outpatient Specialists: [facility] surgery  Chief Complaint  Patient presents with  . Abdominal Pain       Brief Narrative   77 year old male with chronic systolic CHF with EF of 40%, peripheral vascular disease, severe protein calorie malnutrition, COPD with chronic hypoxic respiratory failure on home O2 with left incarcerated inguinal hernia repair with exploratory laparotomy and small bowel resection on 12/3 followed by small bowel obstruction 3 weeks later and managed in the hospital conservatively. Patient presenting with 2-3 days of increasing abdominal cramp with poor p.o. intake and nausea.  Found to have high-grade small bowel obstruction.   Subjective:   No abdominal pain.  Had small bowel movement yesterday.   Assessment  & Plan :    Principal Problem:   Small bowel obstruction due to adhesions [facility]) Nonoperative management with serial abdominal exam, IV fluids.  Follow-up abdominal x-ray this morning showing improvement in bowel obstruction.  NG to gravity and allow sips.  Possibly remove NG tomorrow.  Active Problems: Active Problems:   COPD (chronic obstructive pulmonary disease) (HCC)   Chronic respiratory failure with hypoxia and hypercapnia (HCC) Currently stable.  Continue home O2 (3 L).  Continue home inhaler and add as needed nebs.  Hypoglycemia in pre diabetes Stable on D5.     Peripheral vascular disease, unspecified ([place])   Abdominal aortic  aneurysm without rupture [facility]) Outpatient follow-up.    Protein-calorie malnutrition, severe Severely cachectic.    Has actually gained 9 pounds since last hospital discharge.  Nutrition consult.    Systolic CHF, chronic (HCC) EF of 20% as per echo in 2017.    Reduce IV fluid rate to 50 cc/h since noted to have some fluid overload.  daily IV Lasix.      Consults called: Surgery     Code Status : DNR  Family Communication  :  Wife at bedside  Disposition Plan  : Home once improved  Barriers For Discharge : Active symptoms Consults  : Surgery  Procedures  : CT abdomen  DVT Prophylaxis  :  Lovenox -   Lab Results  Component Value Date   PLT 177 07/06/2017    Antibiotics  :    Anti-infectives (From admission, onward)   None        Objective:   Vitals:   07/06/17 2012 07/06/17 2220 07/07/17 0451 07/07/17 0802  BP:  108/60 112/73   Pulse: 72 82 82   Resp: 16 16 20    Temp:  98.1 F (36.7 C) 98.2 F (36.8 C)   TempSrc:  Oral Oral   SpO2: 93% 95% 96% 92%  Weight:   62.1 kg (137 lb)   Height:        Wt Readings from Last 3 Encounters:  07/07/17 62.1 kg (137 lb)  06/27/17 65.8 kg (145 lb)  05/21/17 56.7 kg (125 lb)     Intake/Output Summary (Last 24 hours) at 07/07/2017 1309 Last data filed at 07/07/2017 1250 Gross per 24 hour  Intake 600 ml  Output 875 ml  Net -275 ml     Physical Exam General: Elderly male, cachectic, not in distress HEENT: NG in place, dry mucosa, temporal wasting Chest: Clear bilaterally CVS: Normal S1-S2, no murmurs GI: Soft, nondistended, nontender, sluggish bowel sounds Musculoskeletal: Warm, no edema    Data Review:    CBC Recent Labs  Lab 07/05/17 0920 07/06/17 0624  WBC 8.5 6.5  HGB 11.6* 11.9*  HCT 38.4* 40.8  PLT 159 177  MCV 99.5 101.7*  MCH 30.1 29.7  MCHC 30.2 29.2*  RDW 14.9 15.2  LYMPHSABS 0.7  --   MONOABS 0.9  --   EOSABS 0.0  --   BASOSABS 0.0  --     Chemistries  Recent Labs  Lab  07/05/17 0920 07/06/17 0624 07/07/17 0643  NA 141 144 142  K 3.0* 3.9 3.8  CL 89* 97* 98*  CO2 44* 37* 37*  GLUCOSE 118* 65 107*  BUN 18 15 14   CREATININE 0.45* 0.42* 0.46*  CALCIUM 8.8* 9.0 8.9  MG 1.9  --   --   AST 16  --   --   ALT 11*  --   --   ALKPHOS 56  --   --   BILITOT 0.6  --   --    ------------------------------------------------------------------------------------------------------------------ No results for input(s): CHOL, HDL, LDLCALC, TRIG, CHOLHDL, LDLDIRECT in the last 72 hours.  Lab Results  Component Value Date   HGBA1C 5.8 (H) 11/12/2013   ------------------------------------------------------------------------------------------------------------------ No results for input(s): TSH, T4TOTAL, T3FREE, THYROIDAB in the last 72 hours.  Invalid input(s): FREET3 ------------------------------------------------------------------------------------------------------------------ No results for input(s): VITAMINB12, FOLATE, FERRITIN, TIBC, IRON, RETICCTPCT in the last 72 hours.  Coagulation profile No results for input(s): INR, PROTIME in the last 168 hours.  No results for input(s): DDIMER in the last 72 hours.  Cardiac Enzymes No results for input(s): CKMB, TROPONINI, MYOGLOBIN in the last 168 hours.  Invalid input(s): CK ------------------------------------------------------------------------------------------------------------------    Component Value Date/Time   BNP 87.0 05/16/2015 0730    Inpatient Medications  Scheduled Meds: . chlorhexidine  15 mL Mouth Rinse BID  . enoxaparin (LOVENOX) injection  40 mg Subcutaneous Q24H  . furosemide  20 mg Intravenous Daily  . mouth rinse  15 mL Mouth Rinse q12n4p  . tiotropium  18 mcg Inhalation Daily   Continuous Infusions: . dextrose  5 % and 0.9 % NaCl with KCl 20 mEq/L 50 mL/hr at 07/07/17 0533   PRN Meds:.acetaminophen **OR** acetaminophen, albuterol, ipratropium-albuterol, morphine injection,  ondansetron **OR** ondansetron (ZOFRAN) IV, phenol  Micro Results No results found for this or any previous visit (from the past 240 hour(s)).  Radiology Reports Ct Abdomen Pelvis W Contrast  Result Date: 07/05/2017 CLINICAL DATA:  77 year old male with acute abdominal and pelvic pain. EXAM: CT ABDOMEN AND PELVIS WITH CONTRAST TECHNIQUE: Multidetector CT imaging of the abdomen and pelvis was performed using the standard protocol following bolus administration of intravenous contrast. CONTRAST:  155mL ISOVUE-300 IOPAMIDOL (ISOVUE-300) INJECTION 61% COMPARISON:  05/03/2017 radiographs, 04/30/2017 CT and prior studies FINDINGS: Lower chest: Cardiomegaly, small to moderate loculated LEFT pleural effusion and trace RIGHT pleural effusion again noted. Bibasilar atelectasis/scarring again identified. Hepatobiliary: No hepatic or gallbladder abnormalities identified. No biliary dilatation. Pancreas: Unremarkable Spleen: Unremarkable Adrenals/Urinary Tract: Nonobstructing bilateral renal calculi and renal cysts are again noted. The adrenal glands are unremarkable. The bladder is not well distended. Stomach/Bowel: Multiple dilated loops of small bowel are identified with collapsed distal small bowel loops compatible with high-grade small bowel obstruction. There is no evidence of pneumoperitoneum. Transition point is appears to lie within the central UPPER pelvis (2:66). No obstructing cause identified. Vascular/Lymphatic: Aortic stent graft noted without definite complicating features. Aortic atherosclerotic calcifications are present. No definite enlarged lymph nodes. Reproductive: Prostate is unremarkable. Other: Diffuse subcutaneous edema noted.  No ascites identified. Musculoskeletal: No acute bony abnormality or suspicious bony lesion. Degenerative changes in the lumbar spine and LEFT hip again noted. IMPRESSION: 1. High-grade small bowel obstruction with transition point appearing to lie within the central UPPER  pelvis. No definite obstructing cause in this obstruction may be caused by an adhesion. No evidence of pneumoperitoneum. 2. Aortic stent graft and Aortic Atherosclerosis (ICD10-I70.0). 3. Unchanged cardiomegaly, small to moderate loculated LEFT pleural effusion, trace RIGHT pleural effusion and bibasilar atelectasis/scarring. Electronically Signed   By: Margarette Canada M.D.   On: 07/05/2017 10:51   Dg Chest Port 1 View  Result Date: 07/06/2017 CLINICAL DATA:  NG tube placement EXAM: PORTABLE CHEST 1 VIEW COMPARISON:  Abdominal radiograph dated 07/06/2017 at 0700 hours FINDINGS: Enteric tube terminates at the GE junction with its side port in the distal esophagus. Cardiomegaly with mild interstitial edema. Mild patchy bilateral lower lobe opacities, likely atelectasis. Small bilateral pleural effusions. Postsurgical changes related to prior CABG. Median sternotomy. IMPRESSION: Enteric tube terminates at the GE junction with the side port in the distal esophagus. Cardiomegaly with mild interstitial edema and small bilateral pleural effusions. Mild patchy bilateral lower lobe opacities, likely atelectasis. Electronically Signed   By: Julian Hy M.D.   On: 07/06/2017 18:23   Dg Chest Portable 1 View  Result Date: 07/05/2017 CLINICAL DATA:  NG tube placement. EXAM: PORTABLE CHEST 1 VIEW COMPARISON:  05/02/2015. FINDINGS: NG tube noted with tip below left hemidiaphragm. Prior CABG. Cardiomegaly with diffuse bilateral pulmonary interstitial prominence and bilateral pleural effusions consistent with CHF. IMPRESSION: 1.  NG tube noted with tip below left hemidiaphragm. 2. Prior CABG. Severe cardiomegaly. Diffuse prominent bilateral interstitial prominence and bilateral pleural effusions consistent with CHF. Electronically Signed   By: Marcello Moores  Register   On: 07/05/2017 11:54   Dg Abd Portable 2v  Result Date: 07/07/2017 CLINICAL DATA:  NG tube, SBO EXAM: PORTABLE ABDOMEN - 2 VIEW COMPARISON:  07/06/2017 FINDINGS:  Enteric tube terminates in the gastric antrum. Paucity of bowel gas, without radiographic findings to suggest small  bowel obstruction. Thoracoabdominal aortic stent. Numerous surgical clips overlying the lower chest and upper abdomen. IMPRESSION: Enteric tube terminates in the gastric antrum. Electronically Signed   By: Julian Hy M.D.   On: 07/07/2017 07:50   Dg Abd Portable 2v  Result Date: 07/06/2017 CLINICAL DATA:  Follow-up small bowel obstruction. EXAM: PORTABLE ABDOMEN - 2 VIEW COMPARISON:  CT abdomen 07/05/2017 FINDINGS: Nasogastric tube with the tip above the gastroesophageal junction. Recommend advancing the tube 14 cm. Relative lack of small bowel air consistent with fluid-filled loops of small bowel as seen on CT performed yesterday. There is no evidence of pneumoperitoneum, portal venous gas or pneumatosis. There are no pathologic calcifications along the expected course of the ureters. Bilateral small pleural effusions. Loculated fluid in the right major fissure. Stable cardiomegaly. Bilateral interstitial prominence. Prior CABG. Diffuse thoracolumbar spondylosis. IMPRESSION: 1. Nasogastric tube with the tip above the gastroesophageal junction. Recommend advancing the tube 14 cm. 2. Relative lack of small bowel air consistent with dilated fluid-filled loops of small bowel as seen on CT performed yesterday. 3. Cardiomegaly with pulmonary vascular congestion. Electronically Signed   By: Kathreen Devoid   On: 07/06/2017 08:24    Time Spent in minutes  25   Demba Nigh M.D on 07/07/2017 at 1:09 PM  Between 7am to 7pm - Pager - [phone]  After 7pm go to [email or web address] - password [facility]  [facility] -  Office  [phone]

## 2017-07-08 LAB — BASIC METABOLIC PANEL
ANION GAP: 8 (ref 5–15)
BUN: 12 mg/dL (ref 6–20)
CALCIUM: 9.1 mg/dL (ref 8.9–10.3)
CO2: 36 mmol/L — ABNORMAL HIGH (ref 22–32)
CREATININE: 0.4 mg/dL — AB (ref 0.61–1.24)
Chloride: 100 mmol/L — ABNORMAL LOW (ref 101–111)
GFR calc Af Amer: >60 mL/min (ref >60)
GLUCOSE: 138 mg/dL — AB (ref 65–99)
Potassium: 4.1 mmol/L (ref 3.5–5.1)
Sodium: 144 mmol/L (ref 135–145)

## 2017-07-08 LAB — GLUCOSE, CAPILLARY: Glucose-Capillary: 130 mg/dL — ABNORMAL HIGH (ref 65–99)

## 2017-07-08 NOTE — Plan of Care (Signed)
  Acute Rehab PT Goals(only PT should resolve) Pt Will Go Supine/Side To Sit 07/08/2017 1454 - Progressing by Lonell Grandchild, PT Flowsheets Taken 07/08/2017 1454  Pt will go Supine/Side to Sit with modified independence Patient Will Transfer Sit To/From Stand 07/08/2017 1454 - Progressing by Lonell Grandchild, PT Flowsheets Taken 07/08/2017 1454  Patient will transfer sit to/from stand with supervision Pt Will Transfer Bed To Chair/Chair To Bed 07/08/2017 1454 - Progressing by Lonell Grandchild, PT Flowsheets Taken 07/08/2017 1454  Pt will Transfer Bed to Chair/Chair to Bed with supervision Pt Will Ambulate 07/08/2017 1454 - Progressing by Lonell Grandchild, PT Flowsheets Taken 07/08/2017 1454  Pt will Ambulate with supervision;50 feet;with rolling walker  2:55 PM, 07/08/17 Lonell Grandchild, MPT Physical Therapist with [facility] [phone] office [phone] mobile phone

## 2017-07-08 NOTE — Progress Notes (Signed)
  Subjective: Patient has had multiple bowel movements and is passing flatus.  Denies any abdominal pain.  Tolerated NG tube to gravity without difficulty.  Objective: Vital signs in last 24 hours: Temp:  [97.8 F (36.6 C)-98.1 F (36.7 C)] 97.8 F (36.6 C) (03/04 0603) Pulse Rate:  [57-94] 94 (03/04 0603) Resp:  [16-18] 18 (03/04 0603) BP: (109-113)/(69-73) 113/71 (03/04 0603) SpO2:  [94 %-95 %] 94 % (03/04 0730) Last BM Date: 07/06/17  Intake/Output from previous day: 03/03 0701 - 03/04 0700 In: 1071.7 [I.V.:1071.7] Out: 750 [Urine:700; Emesis/NG output:50] Intake/Output this shift: No intake/output data recorded.  General appearance: alert, cooperative and no distress GI: soft, non-tender; bowel sounds normal; no masses,  no organomegaly  Lab Results:  Recent Labs    07/06/17 0624  WBC 6.5  HGB 11.9*  HCT 40.8  PLT 177   BMET Recent Labs    07/07/17 0643 07/08/17 0552  NA 142 144  K 3.8 4.1  CL 98* 100*  CO2 37* 36*  GLUCOSE 107* 138*  BUN 14 12  CREATININE 0.46* 0.40*  CALCIUM 8.9 9.1   PT/INR No results for input(s): LABPROT, INR in the last 72 hours.  Studies/Results: Dg Chest Port 1 View  Result Date: 07/06/2017 CLINICAL DATA:  NG tube placement EXAM: PORTABLE CHEST 1 VIEW COMPARISON:  Abdominal radiograph dated 07/06/2017 at 0700 hours FINDINGS: Enteric tube terminates at the GE junction with its side port in the distal esophagus. Cardiomegaly with mild interstitial edema. Mild patchy bilateral lower lobe opacities, likely atelectasis. Small bilateral pleural effusions. Postsurgical changes related to prior CABG. Median sternotomy. IMPRESSION: Enteric tube terminates at the GE junction with the side port in the distal esophagus. Cardiomegaly with mild interstitial edema and small bilateral pleural effusions. Mild patchy bilateral lower lobe opacities, likely atelectasis. Electronically Signed   By: Julian Hy M.D.   On: 07/06/2017 18:23   Dg Abd  Portable 2v  Result Date: 07/07/2017 CLINICAL DATA:  NG tube, SBO EXAM: PORTABLE ABDOMEN - 2 VIEW COMPARISON:  07/06/2017 FINDINGS: Enteric tube terminates in the gastric antrum. Paucity of bowel gas, without radiographic findings to suggest small bowel obstruction. Thoracoabdominal aortic stent. Numerous surgical clips overlying the lower chest and upper abdomen. IMPRESSION: Enteric tube terminates in the gastric antrum. Electronically Signed   By: Julian Hy M.D.   On: 07/07/2017 07:50    Anti-infectives: Anti-infectives (From admission, onward)   None      Assessment/Plan: Impression: Small bowel obstruction, resolving Plan: Will remove NG tube and advance diet as tolerated.  Will encourage ambulation.  No need for acute surgical intervention at this time.  LOS: 3 days    Aviva Signs 07/08/2017

## 2017-07-08 NOTE — Evaluation (Signed)
Physical Therapy Evaluation Patient Details Name: Tommy Turner MRN: [id number] DOB: 29-Mar-1941 Today's Date: 07/08/2017   History of Present Illness  Tommy Turner  is a 77 y.o. male, with chronic systolic CHF with EF of 46% as per prior echo, peripheral vascular disease, severe protein calorie malnutrition COPD with chronic hypoxic respiratory failure on 2 L home O2, left incarcerated inguinal hernia repair with exploratory laparotomy and small bowel resection on 04/08/2017 followed by small bowel obstruction about 3 weeks later for which she was hospitalized and managed conservatively.  Was found to have a hematoma in the left femoral hernia site without any infection.  Patient was hospitalized for over 10 days with NG decompression and discharged home once improved.  Patient presented to the ED with 2-day history of crampy lower abdominal pain worsened with food intake, occasional nausea but no vomiting.  He also noticed his abdomen to be tense and slightly distended.  Patient reports poor p.o. intake, denies any fever, chills, headache, blurred vision, dizziness, shortness of breath, palpitations, diarrhea or dysuria.  Last bowel movement was 2 days ago.  Denies any recent illness or change in his medications.    Clinical Impression  Patient on 3 LPM while performing BLE ROM exercises for 3-4 minutes with O2 sats at 92-93%, demonstrates slow labored cadence without loss of balance, but limited mostly due to SOB with O2 sats dropping to 85% on 3 LPM requiring seated rest break for approximately 7-8 minutes then put back on 4 LPM walk back to bedside and tolerated sitting up in chair after therapy - nursing staff notified.  PLAN: Patient will benefit from continued physical therapy in hospital and recommended venue below to increase strength, balance, endurance for safe ADLs and gait.      Follow Up Recommendations Home health PT;Supervision for mobility/OOB    Equipment Recommendations  None  recommended by PT    Recommendations for Other Services       Precautions / Restrictions Precautions Precautions: Fall Restrictions Weight Bearing Restrictions: No      Mobility  Bed Mobility Overal bed mobility: Needs Assistance Bed Mobility: Supine to Sit     Supine to sit: Supervision        Transfers Overall transfer level: Needs assistance Equipment used: Rolling walker (2 wheeled) Transfers: Sit to/from [facility] Sit to Stand: Min guard Stand pivot transfers: Min guard          Ambulation/Gait Ambulation/Gait assistance: Min guard Ambulation Distance (Feet): 25 Feet Assistive device: Rolling walker (2 wheeled) Gait Pattern/deviations: Decreased step length - right;Decreased step length - left;Decreased stride length   Gait velocity interpretation: Below normal speed for age/gender General Gait Details: demonstrates slow labored cadence without loss of balance, limited mostly due to SOB, fatgiue, on 3 LPM, desaturated to 85%, put back on 4 LPM to walk back to room  Stairs            Wheelchair Mobility    Modified Rankin (Stroke Patients Only)       Balance Overall balance assessment: Needs assistance Sitting-balance support: Feet supported;No upper extremity supported Sitting balance-Leahy Scale: Good     Standing balance support: Bilateral upper extremity supported;During functional activity Standing balance-Leahy Scale: Fair                               Pertinent Vitals/Pain Pain Assessment: No/denies pain    Home Living Family/patient expects to be discharged to::  Private residence Living Arrangements: Spouse/significant other;Other relatives Available Help at Discharge: Family;Available 24 hours/day Type of Home: Mobile home Home Access: Stairs to enter Entrance Stairs-Rails: Can reach both;Left;Right Entrance Stairs-Number of Steps: 2 Home Layout: One level Home Equipment: Walker - 2  wheels;Wheelchair - manual;Toilet riser      Prior Function Level of Independence: Independent with assistive device(s)         Comments: Ambulates household distances with RW     Hand Dominance   Dominant Hand: Right    Extremity/Trunk Assessment   Upper Extremity Assessment Upper Extremity Assessment: Generalized weakness    Lower Extremity Assessment Lower Extremity Assessment: Generalized weakness    Cervical / Trunk Assessment Cervical / Trunk Assessment: Normal  Communication   Communication: No difficulties;HOH  Cognition Arousal/Alertness: Awake/alert Behavior During Therapy: WFL for tasks assessed/performed Overall Cognitive Status: Within Functional Limits for tasks assessed                                        General Comments      Exercises     Assessment/Plan    PT Assessment Patient needs continued PT services  PT Problem List Decreased strength;Decreased activity tolerance;Decreased balance;Decreased mobility       PT Treatment Interventions Gait training;Stair training;Functional mobility training;Therapeutic activities;Therapeutic exercise;Patient/family education    PT Goals (Current goals can be found in the Care Plan section)  Acute Rehab PT Goals Patient Stated Goal: return home with family to assist PT Goal Formulation: With patient Time For Goal Achievement: 07/13/17 Potential to Achieve Goals: Good    Frequency Min 3X/week   Barriers to discharge        Co-evaluation               AM-PAC PT "6 Clicks" Daily Activity  Outcome Measure Difficulty turning over in bed (including adjusting bedclothes, sheets and blankets)?: None Difficulty moving from lying on back to sitting on the side of the bed? : None Difficulty sitting down on and standing up from a chair with arms (e.g., wheelchair, bedside commode, etc,.)?: A Little Help needed moving to and from a bed to chair (including a wheelchair)?: A  Little Help needed walking in hospital room?: A Little Help needed climbing 3-5 steps with a railing? : A Lot 6 Click Score: 19    End of Session Equipment Utilized During Treatment: Oxygen;Gait belt Activity Tolerance: Patient limited by fatigue;Patient tolerated treatment well(Patient limited secondary to SOB) Patient left: in chair;with call bell/phone within reach;with chair alarm set Nurse Communication: Mobility status;Other (comment)(nursing staff informed that patient left up in chair) PT Visit Diagnosis: Unsteadiness on feet (R26.81);Other abnormalities of gait and mobility (R26.89);Muscle weakness (generalized) (M62.81)    Time: 3500-9381 PT Time Calculation (min) (ACUTE ONLY): 34 min   Charges:   PT Evaluation $PT Eval Moderate Complexity: 1 Mod PT Treatments $Therapeutic Activity: 23-37 mins   PT G Codes:        2:47 PM, July 30, 2017 Lonell Grandchild, MPT Physical Therapist with [facility] [phone] office [phone] mobile phone

## 2017-07-08 NOTE — Progress Notes (Signed)
PROGRESS NOTE                                                                                                                                                                                                             Patient Demographics:    Tommy Turner, is a 77 y.o. male, DOB - Jun 27, 1940, VZD:[id number]  Admit date - 07/05/2017   Admitting Physician Louellen Molder, MD  Outpatient Primary MD for the patient is [name], MD  LOS - 3  Outpatient Specialists: [facility] surgery  Chief Complaint  Patient presents with  . Abdominal Pain       Brief Narrative   77 year old male with chronic systolic CHF with EF of 29%, peripheral vascular disease, severe protein calorie malnutrition, COPD with chronic hypoxic respiratory failure on home O2 with left incarcerated inguinal hernia repair with exploratory laparotomy and small bowel resection on 12/3 followed by small bowel obstruction 3 weeks later and managed in the hospital conservatively. Patient presenting with 2-3 days of increasing abdominal cramp with poor p.o. intake and nausea.  Found to have high-grade small bowel obstruction.   Subjective:   At bowel movement yesterday and passing gas.  Denies abdominal pain.  NG to gravity.   Assessment  & Plan :    Principal Problem:   Small bowel obstruction due to adhesions [facility]) Being managed nonoperatively.  Small bowel obstruction resolving.  Tolerating clears.  Remove NG today and advance diet slowly, ambulate. Surgery consult appreciated.  Active Problems: Active Problems:   COPD (chronic obstructive pulmonary disease) (HCC)   Chronic respiratory failure with hypoxia and hypercapnia (HCC) Currently stable.  Continue home O2 (3 L).  Continue home inhaler and add as needed nebs.  Hypoglycemia in pre diabetes Stable on D5.  DC fluids.     Peripheral vascular disease, unspecified ([place])   Abdominal aortic  aneurysm without rupture [facility]) Outpatient follow-up.    Protein-calorie malnutrition, severe Severely cachectic.    Has actually gained 9 pounds since last hospital discharge.  Nutrition consult.    Systolic CHF, chronic (HCC) EF of 20% as per echo in 2017.    Continue daily Lasix.  DC fluid.      Consults called: Surgery     Code Status : DNR  Family Communication  : None at bedside  Disposition Plan  : Home  once improved, possibly in the next 48 hours  Barriers For Discharge : Active symptoms Consults  : Surgery  Procedures  : CT abdomen  DVT Prophylaxis  :  Lovenox -   Lab Results  Component Value Date   PLT 177 07/06/2017    Antibiotics  :    Anti-infectives (From admission, onward)   None        Objective:   Vitals:   07/07/17 1500 07/07/17 2300 07/08/17 0603 07/08/17 0730  BP: 109/73 109/69 113/71   Pulse: (!) 57 88 94   Resp: 18 16 18    Temp: 98.1 F (36.7 C) 97.9 F (36.6 C) 97.8 F (36.6 C)   TempSrc: Oral Oral Oral   SpO2:  95% 94% 94%  Weight:      Height:        Wt Readings from Last 3 Encounters:  07/07/17 62.1 kg (137 lb)  06/27/17 65.8 kg (145 lb)  05/21/17 56.7 kg (125 lb)     Intake/Output Summary (Last 24 hours) at 07/08/2017 1109 Last data filed at 07/08/2017 1103 Gross per 24 hour  Intake 1071.67 ml  Output 1175 ml  Net -103.33 ml     Physical Exam General: Elderly cachectic male not in distress HEENT: NG in place, temporal wasting, moist mucosa Chest: Clear bilaterally CVs: Normal S1 and S2, no murmurs GI: Soft, nondistended, nontender, bowel sounds present Musculoskeletal: Warm, no edema     Data Review:    CBC Recent Labs  Lab 07/05/17 0920 07/06/17 0624  WBC 8.5 6.5  HGB 11.6* 11.9*  HCT 38.4* 40.8  PLT 159 177  MCV 99.5 101.7*  MCH 30.1 29.7  MCHC 30.2 29.2*  RDW 14.9 15.2  LYMPHSABS 0.7  --   MONOABS 0.9  --   EOSABS 0.0  --   BASOSABS 0.0  --     Chemistries  Recent Labs  Lab  07/05/17 0920 07/06/17 0624 07/07/17 0643 07/08/17 0552  NA 141 144 142 144  K 3.0* 3.9 3.8 4.1  CL 89* 97* 98* 100*  CO2 44* 37* 37* 36*  GLUCOSE 118* 65 107* 138*  BUN 18 15 14 12   CREATININE 0.45* 0.42* 0.46* 0.40*  CALCIUM 8.8* 9.0 8.9 9.1  MG 1.9  --   --   --   AST 16  --   --   --   ALT 11*  --   --   --   ALKPHOS 56  --   --   --   BILITOT 0.6  --   --   --    ------------------------------------------------------------------------------------------------------------------ No results for input(s): CHOL, HDL, LDLCALC, TRIG, CHOLHDL, LDLDIRECT in the last 72 hours.  Lab Results  Component Value Date   HGBA1C 5.8 (H) 11/12/2013   ------------------------------------------------------------------------------------------------------------------ No results for input(s): TSH, T4TOTAL, T3FREE, THYROIDAB in the last 72 hours.  Invalid input(s): FREET3 ------------------------------------------------------------------------------------------------------------------ No results for input(s): VITAMINB12, FOLATE, FERRITIN, TIBC, IRON, RETICCTPCT in the last 72 hours.  Coagulation profile No results for input(s): INR, PROTIME in the last 168 hours.  No results for input(s): DDIMER in the last 72 hours.  Cardiac Enzymes No results for input(s): CKMB, TROPONINI, MYOGLOBIN in the last 168 hours.  Invalid input(s): CK ------------------------------------------------------------------------------------------------------------------    Component Value Date/Time   BNP 87.0 05/16/2015 0730    Inpatient Medications  Scheduled Meds: . chlorhexidine  15 mL Mouth Rinse BID  . enoxaparin (LOVENOX) injection  40 mg Subcutaneous Q24H  . furosemide  20 mg  Intravenous Daily  . mouth rinse  15 mL Mouth Rinse q12n4p  . tiotropium  18 mcg Inhalation Daily   Continuous Infusions: . dextrose 5 % and 0.9 % NaCl with KCl 20 mEq/L 50 mL/hr at 07/07/17 2234   PRN Meds:.acetaminophen **OR**  acetaminophen, albuterol, ipratropium-albuterol, morphine injection, ondansetron **OR** ondansetron (ZOFRAN) IV, phenol  Micro Results No results found for this or any previous visit (from the past 240 hour(s)).  Radiology Reports Ct Abdomen Pelvis W Contrast  Result Date: 07/05/2017 CLINICAL DATA:  77 year old male with acute abdominal and pelvic pain. EXAM: CT ABDOMEN AND PELVIS WITH CONTRAST TECHNIQUE: Multidetector CT imaging of the abdomen and pelvis was performed using the standard protocol following bolus administration of intravenous contrast. CONTRAST:  181mL ISOVUE-300 IOPAMIDOL (ISOVUE-300) INJECTION 61% COMPARISON:  05/03/2017 radiographs, 04/30/2017 CT and prior studies FINDINGS: Lower chest: Cardiomegaly, small to moderate loculated LEFT pleural effusion and trace RIGHT pleural effusion again noted. Bibasilar atelectasis/scarring again identified. Hepatobiliary: No hepatic or gallbladder abnormalities identified. No biliary dilatation. Pancreas: Unremarkable Spleen: Unremarkable Adrenals/Urinary Tract: Nonobstructing bilateral renal calculi and renal cysts are again noted. The adrenal glands are unremarkable. The bladder is not well distended. Stomach/Bowel: Multiple dilated loops of small bowel are identified with collapsed distal small bowel loops compatible with high-grade small bowel obstruction. There is no evidence of pneumoperitoneum. Transition point is appears to lie within the central UPPER pelvis (2:66). No obstructing cause identified. Vascular/Lymphatic: Aortic stent graft noted without definite complicating features. Aortic atherosclerotic calcifications are present. No definite enlarged lymph nodes. Reproductive: Prostate is unremarkable. Other: Diffuse subcutaneous edema noted.  No ascites identified. Musculoskeletal: No acute bony abnormality or suspicious bony lesion. Degenerative changes in the lumbar spine and LEFT hip again noted. IMPRESSION: 1. High-grade small bowel  obstruction with transition point appearing to lie within the central UPPER pelvis. No definite obstructing cause in this obstruction may be caused by an adhesion. No evidence of pneumoperitoneum. 2. Aortic stent graft and Aortic Atherosclerosis (ICD10-I70.0). 3. Unchanged cardiomegaly, small to moderate loculated LEFT pleural effusion, trace RIGHT pleural effusion and bibasilar atelectasis/scarring. Electronically Signed   By: Margarette Canada M.D.   On: 07/05/2017 10:51   Dg Chest Port 1 View  Result Date: 07/06/2017 CLINICAL DATA:  NG tube placement EXAM: PORTABLE CHEST 1 VIEW COMPARISON:  Abdominal radiograph dated 07/06/2017 at 0700 hours FINDINGS: Enteric tube terminates at the GE junction with its side port in the distal esophagus. Cardiomegaly with mild interstitial edema. Mild patchy bilateral lower lobe opacities, likely atelectasis. Small bilateral pleural effusions. Postsurgical changes related to prior CABG. Median sternotomy. IMPRESSION: Enteric tube terminates at the GE junction with the side port in the distal esophagus. Cardiomegaly with mild interstitial edema and small bilateral pleural effusions. Mild patchy bilateral lower lobe opacities, likely atelectasis. Electronically Signed   By: Julian Hy M.D.   On: 07/06/2017 18:23   Dg Chest Portable 1 View  Result Date: 07/05/2017 CLINICAL DATA:  NG tube placement. EXAM: PORTABLE CHEST 1 VIEW COMPARISON:  05/02/2015. FINDINGS: NG tube noted with tip below left hemidiaphragm. Prior CABG. Cardiomegaly with diffuse bilateral pulmonary interstitial prominence and bilateral pleural effusions consistent with CHF. IMPRESSION: 1.  NG tube noted with tip below left hemidiaphragm. 2. Prior CABG. Severe cardiomegaly. Diffuse prominent bilateral interstitial prominence and bilateral pleural effusions consistent with CHF. Electronically Signed   By: Marcello Moores  Register   On: 07/05/2017 11:54   Dg Abd Portable 2v  Result Date: 07/07/2017 CLINICAL DATA:  NG  tube, SBO EXAM:  PORTABLE ABDOMEN - 2 VIEW COMPARISON:  07/06/2017 FINDINGS: Enteric tube terminates in the gastric antrum. Paucity of bowel gas, without radiographic findings to suggest small bowel obstruction. Thoracoabdominal aortic stent. Numerous surgical clips overlying the lower chest and upper abdomen. IMPRESSION: Enteric tube terminates in the gastric antrum. Electronically Signed   By: Julian Hy M.D.   On: 07/07/2017 07:50   Dg Abd Portable 2v  Result Date: 07/06/2017 CLINICAL DATA:  Follow-up small bowel obstruction. EXAM: PORTABLE ABDOMEN - 2 VIEW COMPARISON:  CT abdomen 07/05/2017 FINDINGS: Nasogastric tube with the tip above the gastroesophageal junction. Recommend advancing the tube 14 cm. Relative lack of small bowel air consistent with fluid-filled loops of small bowel as seen on CT performed yesterday. There is no evidence of pneumoperitoneum, portal venous gas or pneumatosis. There are no pathologic calcifications along the expected course of the ureters. Bilateral small pleural effusions. Loculated fluid in the right major fissure. Stable cardiomegaly. Bilateral interstitial prominence. Prior CABG. Diffuse thoracolumbar spondylosis. IMPRESSION: 1. Nasogastric tube with the tip above the gastroesophageal junction. Recommend advancing the tube 14 cm. 2. Relative lack of small bowel air consistent with dilated fluid-filled loops of small bowel as seen on CT performed yesterday. 3. Cardiomegaly with pulmonary vascular congestion. Electronically Signed   By: Kathreen Devoid   On: 07/06/2017 08:24    Time Spent in minutes  25   Monica Zahler M.D on 07/08/2017 at 11:09 AM  Between 7am to 7pm - Pager - [phone]  After 7pm go to [email or web address] - password [facility]  [facility] -  Office  [phone]

## 2017-07-08 NOTE — Care Management Note (Addendum)
Case Management Note  Patient Details  Name: ATOM SOLIVAN MRN: [id number] Date of Birth: 11-12-1940  Subjective/Objective:        Admitted with SBO. Pt is from home, lives with wife, mostly ind pta. Pt has home O2, WC, RW pta. He has used [facility] in the past and would like them again. Pt is candidate for COPD program, or modified version. Pilot explained and willing to participate.              Action/Plan: DC home with [facility] referral for COPD program. CM paged MD and requested pulmonology and PM consults as per COPD program requirement. Blake Divine, [facility] rep, aware of referral and will pull pt info from chart. CM will cont to follow.   Expected Discharge Date:     07/08/2017             Expected Discharge Plan:  [place]  In-House Referral:  Hospice / Palliative Care  Discharge planning Services  CM Consult  Post Acute Care Choice:  Home Health Choice offered to:  Patient  HH Arranged:  RN, PT [facility] Agency:  [place]  Status of Service:  In process, will continue to follow   Addendum: pt's Medicare is secondary and so can not be enrolled in COPD programs. Still able to be in the [facility] cardiopulmonary program with med kit at home.   Sherald Barge, RN 07/08/2017, 1:09 PM

## 2017-07-09 LAB — GLUCOSE, CAPILLARY: Glucose-Capillary: 91 mg/dL (ref 65–99)

## 2017-07-09 NOTE — Discharge Summary (Signed)
Physician Discharge Summary  CABLE FEARN XBJ:[id number] DOB: 02/02/1941 DOA: 07/05/2017  PCP: [name], MD  Admit date: 07/05/2017 Discharge date: 07/09/2017  Admitted From: Home Disposition: Home  Recommendations for Outpatient Follow-up:  1. Follow up with PCP in 1-2 weeks 2. Please obtain BMP/CBC in one week 3. Please follow up on the following pending results:  Home Health: PT Equipment/Devices: Chronic home O2 (2 L)  Discharge Condition: Fair CODE STATUS: DNR Diet recommendation: Heart healthy with nutritional supplements.    Discharge Diagnoses:  Principal Problem:   Small bowel obstruction due to adhesions [facility])    Active Problems:   COPD (chronic obstructive pulmonary disease) (HCC)   Peripheral vascular disease, unspecified (HCC)   Abdominal aortic aneurysm without rupture (HCC)   Chronic respiratory failure with hypoxia and hypercapnia (HCC)   Protein-calorie malnutrition, severe     Systolic CHF, chronic (HCC)   Brief narrative/HPI 77 year old male with chronic systolic CHF with EF of 30%, peripheral vascular disease, severe protein calorie malnutrition, COPD with chronic hypoxic respiratory failure on home O2 with left incarcerated inguinal hernia repair with exploratory laparotomy and small bowel resection on 12/3 followed by small bowel obstruction 3 weeks later and managed in the hospital conservatively. Patient presenting with 2-3 days of increasing abdominal cramp with poor p.o. intake and nausea.  Found to have high-grade small bowel obstruction.  Hospital course  Principal Problem:   Small bowel obstruction due to adhesions [facility]) Managed nonoperatively with bowel rest, NG decompression and fluids..  Small bowel obstruction resolved .  Surgery consult appreciated.  Now resolved and tolerating advanced diet.   Active Problems: COPD (chronic obstructive pulmonary disease) (HCC) Chronic respiratory failure with hypoxia and hypercapnia  (HCC) Currently stable. Continue home O2 (3 L). Continue home inhaler and add as needed nebs.  Hypoglycemia in pre diabetes Improved with D5.  No further issues.   Peripheral vascular disease, unspecified ([place]) Abdominal aortic aneurysm without rupture [facility]) Outpatient follow-up.  Protein-calorie malnutrition, severe    Has actually gained 9 pounds since last hospital discharge.    Nutrition consult appreciated Dietitian gave handouts with lists of high protein/kcal foods, strategies to promote weight gain, sample menus and coupons for Boost/Ensure.    Systolic CHF, chronic (HCC) EF of 20% as per echo in 2017.    Euvolemic. Continue torsemide.    Outpatient follow-up.         Family Communication  :  Wife at bedside  Disposition Plan  : Home  Consults  : Surgery  Procedures  : CT abdomen            Discharge Instructions   Allergies as of 07/09/2017   No Known Allergies     Medication List    STOP taking these medications   nitroGLYCERIN 0.4 MG SL tablet Commonly known as:  NITROSTAT   potassium chloride SA 20 MEQ tablet Commonly known as:  K-DUR,KLOR-CON     TAKE these medications   aspirin EC 81 MG tablet Take 81 mg by mouth daily.   clonazePAM 0.5 MG tablet Commonly known as:  KLONOPIN Take 1 tablet (0.5 mg total) by mouth 2 (two) times daily as needed. for anxiety   HYDROcodone-acetaminophen 5-325 MG tablet Commonly known as:  NORCO Take 1 tablet by mouth every 6 (six) hours as needed for moderate pain.   ondansetron 4 MG tablet Commonly known as:  ZOFRAN Take 1 tablet (4 mg total) by mouth every 8 (eight) hours as needed for nausea or  vomiting.   ondansetron 8 MG tablet Commonly known as:  ZOFRAN One po TID for N&V   SPIRIVA HANDIHALER 18 MCG inhalation capsule Generic drug:  tiotropium PLACE 1 CAPSULE INTO INHALER AND INHALE DAILY   torsemide 20 MG tablet Commonly known as:  DEMADEX One po Q other am    albuterol 0.63 MG/3ML nebulizer solution Commonly known as:  ACCUNEB Take 1 ampule by nebulization every 6 (six) hours as needed for wheezing.   VENTOLIN HFA 108 (90 Base) MCG/ACT inhaler Generic drug:  albuterol INHALE 2 PUFFS BY MOUTH EVERY 4 HOURS AS NEEDED FOR WHEEZING OR SHORTNESS OF BREATH.      Follow-up Information    Luking, Elayne Snare, MD. Schedule an appointment as soon as possible for a visit in 1 week(s).   Specialty:  Family Medicine Contact information: [place] [id number] [phone]          No Known Allergies      Procedures/Studies: Ct Abdomen Pelvis W Contrast  Result Date: 07/05/2017 CLINICAL DATA:  77 year old male with acute abdominal and pelvic pain. EXAM: CT ABDOMEN AND PELVIS WITH CONTRAST TECHNIQUE: Multidetector CT imaging of the abdomen and pelvis was performed using the standard protocol following bolus administration of intravenous contrast. CONTRAST:  165mL ISOVUE-300 IOPAMIDOL (ISOVUE-300) INJECTION 61% COMPARISON:  05/03/2017 radiographs, 04/30/2017 CT and prior studies FINDINGS: Lower chest: Cardiomegaly, small to moderate loculated LEFT pleural effusion and trace RIGHT pleural effusion again noted. Bibasilar atelectasis/scarring again identified. Hepatobiliary: No hepatic or gallbladder abnormalities identified. No biliary dilatation. Pancreas: Unremarkable Spleen: Unremarkable Adrenals/Urinary Tract: Nonobstructing bilateral renal calculi and renal cysts are again noted. The adrenal glands are unremarkable. The bladder is not well distended. Stomach/Bowel: Multiple dilated loops of small bowel are identified with collapsed distal small bowel loops compatible with high-grade small bowel obstruction. There is no evidence of pneumoperitoneum. Transition point is appears to lie within the central UPPER pelvis (2:66). No obstructing cause identified. Vascular/Lymphatic: Aortic stent graft noted without definite complicating  features. Aortic atherosclerotic calcifications are present. No definite enlarged lymph nodes. Reproductive: Prostate is unremarkable. Other: Diffuse subcutaneous edema noted.  No ascites identified. Musculoskeletal: No acute bony abnormality or suspicious bony lesion. Degenerative changes in the lumbar spine and LEFT hip again noted. IMPRESSION: 1. High-grade small bowel obstruction with transition point appearing to lie within the central UPPER pelvis. No definite obstructing cause in this obstruction may be caused by an adhesion. No evidence of pneumoperitoneum. 2. Aortic stent graft and Aortic Atherosclerosis (ICD10-I70.0). 3. Unchanged cardiomegaly, small to moderate loculated LEFT pleural effusion, trace RIGHT pleural effusion and bibasilar atelectasis/scarring. Electronically Signed   By: Margarette Canada M.D.   On: 07/05/2017 10:51   Dg Chest Port 1 View  Result Date: 07/06/2017 CLINICAL DATA:  NG tube placement EXAM: PORTABLE CHEST 1 VIEW COMPARISON:  Abdominal radiograph dated 07/06/2017 at 0700 hours FINDINGS: Enteric tube terminates at the GE junction with its side port in the distal esophagus. Cardiomegaly with mild interstitial edema. Mild patchy bilateral lower lobe opacities, likely atelectasis. Small bilateral pleural effusions. Postsurgical changes related to prior CABG. Median sternotomy. IMPRESSION: Enteric tube terminates at the GE junction with the side port in the distal esophagus. Cardiomegaly with mild interstitial edema and small bilateral pleural effusions. Mild patchy bilateral lower lobe opacities, likely atelectasis. Electronically Signed   By: Julian Hy M.D.   On: 07/06/2017 18:23   Dg Chest Portable 1 View  Result Date: 07/05/2017 CLINICAL DATA:  NG tube placement.  EXAM: PORTABLE CHEST 1 VIEW COMPARISON:  05/02/2015. FINDINGS: NG tube noted with tip below left hemidiaphragm. Prior CABG. Cardiomegaly with diffuse bilateral pulmonary interstitial prominence and bilateral  pleural effusions consistent with CHF. IMPRESSION: 1.  NG tube noted with tip below left hemidiaphragm. 2. Prior CABG. Severe cardiomegaly. Diffuse prominent bilateral interstitial prominence and bilateral pleural effusions consistent with CHF. Electronically Signed   By: Marcello Moores  Register   On: 07/05/2017 11:54   Dg Abd Portable 2v  Result Date: 07/07/2017 CLINICAL DATA:  NG tube, SBO EXAM: PORTABLE ABDOMEN - 2 VIEW COMPARISON:  07/06/2017 FINDINGS: Enteric tube terminates in the gastric antrum. Paucity of bowel gas, without radiographic findings to suggest small bowel obstruction. Thoracoabdominal aortic stent. Numerous surgical clips overlying the lower chest and upper abdomen. IMPRESSION: Enteric tube terminates in the gastric antrum. Electronically Signed   By: Julian Hy M.D.   On: 07/07/2017 07:50   Dg Abd Portable 2v  Result Date: 07/06/2017 CLINICAL DATA:  Follow-up small bowel obstruction. EXAM: PORTABLE ABDOMEN - 2 VIEW COMPARISON:  CT abdomen 07/05/2017 FINDINGS: Nasogastric tube with the tip above the gastroesophageal junction. Recommend advancing the tube 14 cm. Relative lack of small bowel air consistent with fluid-filled loops of small bowel as seen on CT performed yesterday. There is no evidence of pneumoperitoneum, portal venous gas or pneumatosis. There are no pathologic calcifications along the expected course of the ureters. Bilateral small pleural effusions. Loculated fluid in the right major fissure. Stable cardiomegaly. Bilateral interstitial prominence. Prior CABG. Diffuse thoracolumbar spondylosis. IMPRESSION: 1. Nasogastric tube with the tip above the gastroesophageal junction. Recommend advancing the tube 14 cm. 2. Relative lack of small bowel air consistent with dilated fluid-filled loops of small bowel as seen on CT performed yesterday. 3. Cardiomegaly with pulmonary vascular congestion. Electronically Signed   By: Kathreen Devoid   On: 07/06/2017 08:24       Subjective: Continues to feel better.  No abdominal pain, tolerating advanced diet and having regular bowel movements.  Discharge Exam: Vitals:   07/09/17 0858 07/09/17 1337  BP:  116/65  Pulse:  76  Resp:  18  Temp:  97.6 F (36.4 C)  SpO2: 96% 94%   Vitals:   07/08/17 2040 07/09/17 0535 07/09/17 0858 07/09/17 1337  BP:  124/81  116/65  Pulse:  86  76  Resp:  16  18  Temp:  98.4 F (36.9 C)  97.6 F (36.4 C)  TempSrc:  Oral  Oral  SpO2: 95% 95% 96% 94%  Weight:      Height:        General: Elderly thin built male not in distress HEENT: Moist mucosa, temporal wasting, supple neck Chest: Clear bilaterally CVS: Normal S1 and S2, no murmurs GI: Soft, nondistended, nontender Musculoskeletal: Warm, no edema     The results of significant diagnostics from this hospitalization (including imaging, microbiology, ancillary and laboratory) are listed below for reference.     Microbiology: No results found for this or any previous visit (from the past 240 hour(s)).   Labs: BNP (last 3 results) No results for input(s): BNP in the last 8760 hours. Basic Metabolic Panel: Recent Labs  Lab 07/05/17 0920 07/06/17 0624 07/07/17 0643 07/08/17 0552  NA 141 144 142 144  K 3.0* 3.9 3.8 4.1  CL 89* 97* 98* 100*  CO2 44* 37* 37* 36*  GLUCOSE 118* 65 107* 138*  BUN 18 15 14 12   CREATININE 0.45* 0.42* 0.46* 0.40*  CALCIUM 8.8* 9.0 8.9 9.1  MG 1.9  --   --   --    Liver Function Tests: Recent Labs  Lab 07/05/17 0920  AST 16  ALT 11*  ALKPHOS 56  BILITOT 0.6  PROT 5.6*  ALBUMIN 2.7*   No results for input(s): LIPASE, AMYLASE in the last 168 hours. No results for input(s): AMMONIA in the last 168 hours. CBC: Recent Labs  Lab 07/05/17 0920 07/06/17 0624  WBC 8.5 6.5  NEUTROABS 6.9  --   HGB 11.6* 11.9*  HCT 38.4* 40.8  MCV 99.5 101.7*  PLT 159 177   Cardiac Enzymes: No results for input(s): CKTOTAL, CKMB, CKMBINDEX, TROPONINI in the last 168  hours. BNP: Invalid input(s): POCBNP CBG: Recent Labs  Lab 07/06/17 0735 07/06/17 0808 07/07/17 0815 07/08/17 0732 07/09/17 0731  GLUCAP 56* 70 95 130* 91   D-Dimer No results for input(s): DDIMER in the last 72 hours. Hgb A1c No results for input(s): HGBA1C in the last 72 hours. Lipid Profile No results for input(s): CHOL, HDL, LDLCALC, TRIG, CHOLHDL, LDLDIRECT in the last 72 hours. Thyroid function studies No results for input(s): TSH, T4TOTAL, T3FREE, THYROIDAB in the last 72 hours.  Invalid input(s): FREET3 Anemia work up No results for input(s): VITAMINB12, FOLATE, FERRITIN, TIBC, IRON, RETICCTPCT in the last 72 hours. Urinalysis    Component Value Date/Time   COLORURINE AMBER (A) 07/05/2017 0937   APPEARANCEUR HAZY (A) 07/05/2017 0937   LABSPEC 1.020 07/05/2017 0937   PHURINE 6.0 07/05/2017 0937   GLUCOSEU NEGATIVE 07/05/2017 0937   HGBUR NEGATIVE 07/05/2017 0937   BILIRUBINUR NEGATIVE 07/05/2017 0937   KETONESUR NEGATIVE 07/05/2017 0937   PROTEINUR 30 (A) 07/05/2017 0937   UROBILINOGEN 1.0 09/10/2014 0919   NITRITE NEGATIVE 07/05/2017 0937   LEUKOCYTESUR NEGATIVE 07/05/2017 0937   Sepsis Labs Invalid input(s): PROCALCITONIN,  WBC,  LACTICIDVEN Microbiology No results found for this or any previous visit (from the past 240 hour(s)).   Time coordinating discharge: <30 minutes  SIGNED:   Louellen Molder, MD  [facility] 07/09/2017, 1:39 PM Pager   If 7PM-7AM, please contact night-coverage [email or web address] Password [email or web address]

## 2017-07-09 NOTE — Progress Notes (Signed)
Nutrition Brief Follow-Up Note  RD asked to see patient prior to his discharge today. Remote note completed on patient Saturday. He is known to RD from past encounters.   Patient is alone. He is poorer historian and quite difficult to understand.   Patient confirms that his PCP consistently harps on him about weight gain. He says his PCP told him to drink a glass of water every time he swallows "to help push it through"  RD went asked questions pertaining to his eating habits. Patient eats 3x a day. He sounds to favor mostly high carb items such as pasta/potatoes. He only drinks 0-calorie beverages, specifically water with [place] in it. It sounds the majority of his meals are brought from outside by his wife. He notes getting chinese the other day.    He is in a difficult position because these fast food items are laden with sodium, though he is unable to prepare healthier items on his own due to extremely poor functional status and the fact his wife works.   RD reviewed eating strategies for weight gain:   Firstly, he should be consume CALORIE-CONTAINING BEVERAGES. This is imperative. Drinking only water, while hydrating, is filling him up and not providing him with any calorie. Advocated for higher kcal bevs such as whole milk or supplements.   Secondly, reviewed principle of "never eating a food by itself". Condoned adding condiments, syrups, creams and other additives to his meals. These items take up minimal room in the stomach, but will provide him with much needed kcals/protein.   Finally, discussed need to minimize periods of fasting throughout the day. He should try to eat something before bed and early in the morning. He says he is already consuming "12 PB crackers" before bed.   RD left handouts with lists of high protein/kcal foods, strategies to promote weight gain, sample menus as well as coupons for Boost/Ensure.   No further interventions warranted. Pt anticipated to D/C later  today.   Burtis Junes RD, LDN, CNSC Clinical Nutrition Pager: [phone] 07/09/2017 11:41 AM

## 2017-07-09 NOTE — Progress Notes (Signed)
  Subjective: Patient tolerating soft diet well.  No abdominal pain noted.  No emesis noted.  Objective: Vital signs in last 24 hours: Temp:  [97.9 F (36.6 C)-98.4 F (36.9 C)] 98.4 F (36.9 C) (03/05 0535) Pulse Rate:  [79-89] 86 (03/05 0535) Resp:  [15-18] 16 (03/05 0535) BP: (106-124)/(72-81) 124/81 (03/05 0535) SpO2:  [95 %-97 %] 95 % (03/05 0535) Last BM Date: 07/08/17  Intake/Output from previous day: 03/04 0701 - 03/05 0700 In: 904.2 [P.O.:480; I.V.:424.2] Out: 1100 [Urine:1100] Intake/Output this shift: No intake/output data recorded.  General appearance: alert, cooperative and no distress GI: soft, non-tender; bowel sounds normal; no masses,  no organomegaly  Lab Results:  No results for input(s): WBC, HGB, HCT, PLT in the last 72 hours. BMET Recent Labs    07/07/17 0643 07/08/17 0552  NA 142 144  K 3.8 4.1  CL 98* 100*  CO2 37* 36*  GLUCOSE 107* 138*  BUN 14 12  CREATININE 0.46* 0.40*  CALCIUM 8.9 9.1   PT/INR No results for input(s): LABPROT, INR in the last 72 hours.  Studies/Results: No results found.  Anti-infectives: Anti-infectives (From admission, onward)   None      Assessment/Plan: Impression: Small bowel obstruction, resolved. Plan: Continue current diet.  Encourage ambulation.  No need for acute surgical intervention.  LOS: 4 days    Aviva Signs 07/09/2017

## 2017-07-09 NOTE — Progress Notes (Signed)
Discharge instructions gone over with patient and spouse. Verbalized understanding. IV's removed by NT, patient tolerated procedure well.

## 2017-07-09 NOTE — Care Management Important Message (Signed)
Important Message  Patient Details  Name: Tommy Turner MRN: [id number] Date of Birth: 07/13/1940   Medicare Important Message Given:  Yes    Sherald Barge, RN 07/09/2017, 11:24 AM

## 2017-07-09 NOTE — Care Management Note (Addendum)
Case Management Note  Patient Details  Name: Tommy Turner MRN: [id number] Date of Birth: Apr 14, 1941  Expected Discharge Date:      07/09/17            Expected Discharge Plan:  [place]  In-House Referral:  Hospice / Palliative Care  Discharge planning Services  CM Consult  Post Acute Care Choice:  Home Health Choice offered to:  Patient  DME Arranged:    DME Agency:     HH Arranged:  RN, PT [place] Agency:  [facility]  Status of Service:  Completed, signed off  If discussed at [place] of Stay Meetings, dates discussed:   Additional Comments: Anticipate DC home today with [place] referral. Blake Divine, [facility] rep, aware of DC today. Pt aware HH has 48 hrs to make first visit. Pt with 3 admission in 6 months, active with [facility] currently.  Sherald Barge, RN 07/09/2017, 1:14 PM

## 2017-07-11 ENCOUNTER — Encounter (HOSPITAL_COMMUNITY): Payer: Self-pay

## 2017-07-11 ENCOUNTER — Emergency Department (HOSPITAL_COMMUNITY): Payer: 59

## 2017-07-11 ENCOUNTER — Inpatient Hospital Stay (HOSPITAL_COMMUNITY)
Admission: EM | Admit: 2017-07-11 | Discharge: 2017-08-05 | DRG: 871 | Disposition: E | Payer: 59 | Attending: Internal Medicine | Admitting: Internal Medicine

## 2017-07-11 DIAGNOSIS — F419 Anxiety disorder, unspecified: Secondary | ICD-10-CM | POA: Diagnosis present

## 2017-07-11 DIAGNOSIS — R6520 Severe sepsis without septic shock: Secondary | ICD-10-CM | POA: Diagnosis not present

## 2017-07-11 DIAGNOSIS — J189 Pneumonia, unspecified organism: Secondary | ICD-10-CM | POA: Diagnosis present

## 2017-07-11 DIAGNOSIS — J9621 Acute and chronic respiratory failure with hypoxia: Secondary | ICD-10-CM | POA: Diagnosis not present

## 2017-07-11 DIAGNOSIS — R188 Other ascites: Secondary | ICD-10-CM | POA: Diagnosis not present

## 2017-07-11 DIAGNOSIS — Z515 Encounter for palliative care: Secondary | ICD-10-CM | POA: Diagnosis present

## 2017-07-11 DIAGNOSIS — K5900 Constipation, unspecified: Secondary | ICD-10-CM | POA: Diagnosis present

## 2017-07-11 DIAGNOSIS — J9601 Acute respiratory failure with hypoxia: Secondary | ICD-10-CM | POA: Diagnosis not present

## 2017-07-11 DIAGNOSIS — I110 Hypertensive heart disease with heart failure: Secondary | ICD-10-CM | POA: Diagnosis present

## 2017-07-11 DIAGNOSIS — Z66 Do not resuscitate: Secondary | ICD-10-CM | POA: Diagnosis not present

## 2017-07-11 DIAGNOSIS — Y95 Nosocomial condition: Secondary | ICD-10-CM | POA: Diagnosis present

## 2017-07-11 DIAGNOSIS — R05 Cough: Secondary | ICD-10-CM | POA: Diagnosis not present

## 2017-07-11 DIAGNOSIS — R571 Hypovolemic shock: Secondary | ICD-10-CM | POA: Diagnosis present

## 2017-07-11 DIAGNOSIS — I5022 Chronic systolic (congestive) heart failure: Secondary | ICD-10-CM | POA: Diagnosis present

## 2017-07-11 DIAGNOSIS — F329 Major depressive disorder, single episode, unspecified: Secondary | ICD-10-CM | POA: Diagnosis present

## 2017-07-11 DIAGNOSIS — Z8619 Personal history of other infectious and parasitic diseases: Secondary | ICD-10-CM

## 2017-07-11 DIAGNOSIS — Z825 Family history of asthma and other chronic lower respiratory diseases: Secondary | ICD-10-CM

## 2017-07-11 DIAGNOSIS — I714 Abdominal aortic aneurysm, without rupture: Secondary | ICD-10-CM | POA: Diagnosis present

## 2017-07-11 DIAGNOSIS — I739 Peripheral vascular disease, unspecified: Secondary | ICD-10-CM | POA: Diagnosis present

## 2017-07-11 DIAGNOSIS — A419 Sepsis, unspecified organism: Principal | ICD-10-CM | POA: Diagnosis present

## 2017-07-11 DIAGNOSIS — I249 Acute ischemic heart disease, unspecified: Secondary | ICD-10-CM | POA: Diagnosis present

## 2017-07-11 DIAGNOSIS — Z8249 Family history of ischemic heart disease and other diseases of the circulatory system: Secondary | ICD-10-CM

## 2017-07-11 DIAGNOSIS — E785 Hyperlipidemia, unspecified: Secondary | ICD-10-CM | POA: Diagnosis present

## 2017-07-11 DIAGNOSIS — I959 Hypotension, unspecified: Secondary | ICD-10-CM | POA: Diagnosis not present

## 2017-07-11 DIAGNOSIS — I252 Old myocardial infarction: Secondary | ICD-10-CM

## 2017-07-11 DIAGNOSIS — R6521 Severe sepsis with septic shock: Secondary | ICD-10-CM | POA: Diagnosis not present

## 2017-07-11 DIAGNOSIS — I2510 Atherosclerotic heart disease of native coronary artery without angina pectoris: Secondary | ICD-10-CM | POA: Diagnosis present

## 2017-07-11 DIAGNOSIS — J449 Chronic obstructive pulmonary disease, unspecified: Secondary | ICD-10-CM | POA: Diagnosis present

## 2017-07-11 DIAGNOSIS — Z9981 Dependence on supplemental oxygen: Secondary | ICD-10-CM

## 2017-07-11 DIAGNOSIS — Z87891 Personal history of nicotine dependence: Secondary | ICD-10-CM

## 2017-07-11 DIAGNOSIS — Z79899 Other long term (current) drug therapy: Secondary | ICD-10-CM

## 2017-07-11 DIAGNOSIS — R531 Weakness: Secondary | ICD-10-CM | POA: Diagnosis not present

## 2017-07-11 DIAGNOSIS — E162 Hypoglycemia, unspecified: Secondary | ICD-10-CM | POA: Diagnosis present

## 2017-07-11 DIAGNOSIS — R0602 Shortness of breath: Secondary | ICD-10-CM | POA: Diagnosis not present

## 2017-07-11 DIAGNOSIS — K8080 Other cholelithiasis without obstruction: Secondary | ICD-10-CM | POA: Diagnosis present

## 2017-07-11 DIAGNOSIS — Z8711 Personal history of peptic ulcer disease: Secondary | ICD-10-CM

## 2017-07-11 DIAGNOSIS — K8020 Calculus of gallbladder without cholecystitis without obstruction: Secondary | ICD-10-CM | POA: Diagnosis not present

## 2017-07-11 DIAGNOSIS — Z7982 Long term (current) use of aspirin: Secondary | ICD-10-CM

## 2017-07-11 LAB — CBG MONITORING, ED
GLUCOSE-CAPILLARY: 55 mg/dL — AB (ref 65–99)
GLUCOSE-CAPILLARY: 137 mg/dL — AB (ref 65–99)
GLUCOSE-CAPILLARY: 113 mg/dL — AB (ref 65–99)

## 2017-07-11 LAB — CBC WITH DIFFERENTIAL/PLATELET
BASOS ABS: 0 10*3/uL (ref 0.0–0.1)
Basophils Relative: 0 %
EOS ABS: 0 10*3/uL (ref 0.0–0.7)
Eosinophils Relative: 0 %
HCT: 36.5 % — ABNORMAL LOW (ref 39.0–52.0)
HEMOGLOBIN: 10.7 g/dL — AB (ref 13.0–17.0)
LYMPHS ABS: 0.3 10*3/uL — AB (ref 0.7–4.0)
LYMPHS PCT: 2 %
MCH: 29.9 pg (ref 26.0–34.0)
MCHC: 29.3 g/dL — ABNORMAL LOW (ref 30.0–36.0)
MCV: 102 fL — ABNORMAL HIGH (ref 78.0–100.0)
Monocytes Absolute: 1 10*3/uL (ref 0.1–1.0)
Monocytes Relative: 6 %
Neutro Abs: 14.1 10*3/uL — ABNORMAL HIGH (ref 1.7–7.7)
Neutrophils Relative %: 92 %
Platelets: 145 10*3/uL — ABNORMAL LOW (ref 150–400)
RBC: 3.58 MIL/uL — ABNORMAL LOW (ref 4.22–5.81)
RDW: 14.9 % (ref 11.5–15.5)
WBC: 15.4 10*3/uL — AB (ref 4.0–10.5)

## 2017-07-11 LAB — COMPREHENSIVE METABOLIC PANEL
ALT: 708 U/L — ABNORMAL HIGH (ref 17–63)
AST: 1584 U/L — ABNORMAL HIGH (ref 15–41)
Albumin: 2.2 g/dL — ABNORMAL LOW (ref 3.5–5.0)
Alkaline Phosphatase: 234 U/L — ABNORMAL HIGH (ref 38–126)
Anion gap: 13 (ref 5–15)
BUN: 22 mg/dL — ABNORMAL HIGH (ref 6–20)
CHLORIDE: 99 mmol/L — AB (ref 101–111)
CO2: 30 mmol/L (ref 22–32)
CREATININE: 0.96 mg/dL (ref 0.61–1.24)
Calcium: 8.3 mg/dL — ABNORMAL LOW (ref 8.9–10.3)
GFR calc Af Amer: >60 mL/min (ref >60)
Glucose, Bld: 165 mg/dL — ABNORMAL HIGH (ref 65–99)
POTASSIUM: 3.7 mmol/L (ref 3.5–5.1)
Sodium: 142 mmol/L (ref 135–145)
Total Bilirubin: 2.6 mg/dL — ABNORMAL HIGH (ref 0.3–1.2)
Total Protein: 5 g/dL — ABNORMAL LOW (ref 6.5–8.1)

## 2017-07-11 LAB — I-STAT CG4 LACTIC ACID, ED: LACTIC ACID, VENOUS: 5.26 mmol/L — AB (ref 0.5–1.9)

## 2017-07-11 LAB — TROPONIN I: Troponin I: 6.63 ng/mL (ref <0.03)

## 2017-07-11 LAB — PROTIME-INR
INR: 2.5
PROTHROMBIN TIME: 26.8 s — AB (ref 11.4–15.2)

## 2017-07-11 LAB — APTT: aPTT: 38 seconds — ABNORMAL HIGH (ref 24–36)

## 2017-07-11 LAB — BRAIN NATRIURETIC PEPTIDE: B NATRIURETIC PEPTIDE 5: 2078 pg/mL — AB (ref 0.0–100.0)

## 2017-07-11 MED ORDER — ONDANSETRON HCL 4 MG/2ML IJ SOLN
4.0000 mg | Freq: Once | INTRAMUSCULAR | Status: AC
  Administered 2017-07-11: 4 mg via INTRAVENOUS

## 2017-07-11 MED ORDER — SODIUM CHLORIDE 0.9 % IV BOLUS (SEPSIS)
1000.0000 mL | Freq: Once | INTRAVENOUS | Status: AC
  Administered 2017-07-11: 1000 mL via INTRAVENOUS

## 2017-07-11 MED ORDER — PIPERACILLIN-TAZOBACTAM 3.375 G IVPB: 3.3750 g | Freq: Three times a day (TID) | INTRAVENOUS | Status: DC

## 2017-07-11 MED ORDER — PIPERACILLIN-TAZOBACTAM 3.375 G IVPB 30 MIN
3.3750 g | Freq: Once | INTRAVENOUS | Status: AC
Start: 2017-07-11 — End: 2017-07-11
  Administered 2017-07-11: 3.375 g via INTRAVENOUS
  Filled 2017-07-11: qty 50

## 2017-07-11 MED ORDER — DEXTROSE 50 % IV SOLN
50.0000 mL | Freq: Once | INTRAVENOUS | Status: AC
  Administered 2017-07-11: 50 mL via INTRAVENOUS

## 2017-07-11 MED ORDER — ONDANSETRON HCL 4 MG/2ML IJ SOLN
INTRAMUSCULAR | Status: AC
  Filled 2017-07-11: qty 2

## 2017-07-11 MED ORDER — DEXTROSE 50 % IV SOLN
INTRAVENOUS | Status: AC
  Filled 2017-07-11: qty 50

## 2017-07-11 MED ORDER — IOPAMIDOL (ISOVUE-370) INJECTION 76%
100.0000 mL | Freq: Once | INTRAVENOUS | Status: AC | PRN
  Administered 2017-07-11: 100 mL via INTRAVENOUS

## 2017-07-11 MED ORDER — VANCOMYCIN HCL IN DEXTROSE 1-5 GM/200ML-% IV SOLN
1000.0000 mg | Freq: Once | INTRAVENOUS | Status: AC
  Administered 2017-07-11: 1000 mg via INTRAVENOUS
  Filled 2017-07-11: qty 200

## 2017-07-11 MED ORDER — VANCOMYCIN HCL 500 MG IV SOLR
500.0000 mg | Freq: Two times a day (BID) | INTRAVENOUS | Status: DC
  Filled 2017-07-11: qty 500

## 2017-07-11 NOTE — ED Notes (Signed)
CRITICAL VALUE ALERT  Critical Value:  Troponin 6.63 Date & Time Notied:07/28/2017 @ 2345 Provider Notified: Lita Mains Orders Received/Actions taken: None yet

## 2017-07-11 NOTE — ED Provider Notes (Signed)
[facility] EMERGENCY DEPARTMENT Provider Note   CSN: [id number] Arrival date & time: 07/23/2017  2109     History   Chief Complaint Chief Complaint  Patient presents with  . Hypotension  . Hypoglycemia    HPI Tommy Turner is a 77 y.o. male.  HPI Patient recently discharged from hospital for small bowel obstruction.  EMS called to residency for vomiting with dark colored vomitus.  Patient found to be hypoglycemic and hypotensive.  Patient denies any abdominal pain.  States he has been constipated.  Endorses ongoing cough productive of white sputum.  Currently denies chest pain. Past Medical History:  Diagnosis Date  . AAA (abdominal aortic aneurysm) ([place])   . Aneurysm ([place])   . Anxiety    takes Xanax daily as needed  . CAD (coronary artery disease)   . CHF (congestive heart failure) ([place])   . COPD (chronic obstructive pulmonary disease) ([facility])    Albuterol inhaler and neb daily as needed;takes SPiriva daily  . Depression    takes Celexa daily  . History of gastric ulcer    gastric  . History of shingles ~ 2008  . Hyperlipidemia    takes Atorvastatin daily  . Hypertension    takes Metoprolol and Benazepril  daily  . Myocardial infarction ([place]) 1990s X 1  . On home oxygen therapy    "3L; 24/7" (05/01/2017)  . Partial small bowel obstruction ([place]) 04/30/2017  . Peripheral edema    takes Furosemide daily  . Shortness of breath     on oxygen  2 liters continuous    Patient Active Problem List   Diagnosis Date Noted  . Small bowel obstruction due to adhesions ([place]) 07/05/2017  . Severe malnutrition ([place]) 05/06/2017  . SBO (small bowel obstruction) ([place]) 04/30/2017  . Incarcerated inguinal hernia, unilateral 04/06/2017  . Incarcerated inguinal hernia 04/06/2017  . Senile purpura ([place]) 06/18/2016  . Cachexia ([place]) 06/18/2016  . Palliative care encounter   . Goals of care, counseling/discussion   . DNR (do not resuscitate) discussion   . Acute encephalopathy  04/07/2016  . Anemia 04/05/2016  . Thrombocytopenia ([place]) 04/05/2016  . Fever 04/05/2016  . Elevated troponin 04/05/2016  . CAP (community acquired pneumonia) 04/04/2016  . Sepsis ([place]) 04/04/2016  . BPH (benign prostatic hypertrophy) with urinary retention 07/01/2015  . Hypotension 05/24/2015  . Systolic CHF, chronic ([place]) 05/24/2015  . Protein-calorie malnutrition, severe 05/04/2015  . Chronic respiratory failure with hypoxia and hypercapnia ([place]) 05/03/2015  . Chronic systolic CHF (congestive heart failure) ([place]) 05/03/2015  . Leg swelling- Bilateral leg / foot 10/21/2014  . Abdominal aortic aneurysm without rupture ([place]) 09/14/2014  . Dyspnea 06/03/2014  . Elevated PSA 03/09/2014  . Peripheral vascular disease, unspecified ([place]) 01/22/2014  . Chronic pain syndrome 10/27/2013  . Hyperglycemia 04/09/2013  . Acute respiratory failure with hypoxia ([place]) 04/09/2013  . COPD with acute exacerbation ([place]) 04/05/2013  . COPD exacerbation ([place]) 04/05/2013  . Loss of weight 03/18/2013  . Early satiety 03/18/2013  . Dysphagia, unspecified(787.20) 03/18/2013  . COPD (chronic obstructive pulmonary disease) ([place]) 10/31/2012  . Hyperlipemia 10/31/2012  . CAD (coronary artery disease) 10/31/2012  . Anxiety 10/31/2012  . Abdominal aortic aneurysm ([place]) 02/02/2011  . TOBACCO ABUSE 09/20/2009  . PEPTIC ULCER DISEASE 09/20/2009  . Essential hypertension 09/19/2009  . BRONCHITIS, CHRONIC 09/19/2009    Past Surgical History:  Procedure Laterality Date  . ABDOMINAL AORTIC ENDOVASCULAR STENT GRAFT N/A 09/14/2014   Procedure: ABDOMINAL AORTIC ENDOVASCULAR STENT GRAFT;  Surgeon:  Conrad Tecumseh, MD;  Location: [place];  Service: Vascular;  Laterality: N/A;  . BOWEL RESECTION  04/08/2017   Procedure: SMALL BOWEL RESECTION;  Surgeon: Erroll Luna, MD;  Location: [place];  Service: General;;  . CARDIAC CATHETERIZATION  1990s X 1  . CORONARY ARTERY BYPASS GRAFT  1990s   CABG X6  .  ESOPHAGOGASTRODUODENOSCOPY    . FEMORAL HERNIA REPAIR Left 04/08/2017   Procedure: HERNIA REPAIR FEMORAL;  Surgeon: Erroll Luna, MD;  Location: [place];  Service: General;  Laterality: Left;  . HERNIA REPAIR Left    inguinal  . INSERTION OF MESH  04/08/2017   Procedure: INSERTION OF MESH;  Surgeon: Erroll Luna, MD;  Location: [place];  Service: General;;  . LAPAROTOMY  04/08/2017   Procedure: EXPLORATORY LAPAROTOMY;  Surgeon: Erroll Luna, MD;  Location: [place];  Service: General;;  . PARTIAL GASTRECTOMY     approx 1990  . TONSILLECTOMY     as a child  . TRANSURETHRAL RESECTION OF PROSTATE N/A 11/24/2015   Procedure: TRANSURETHRAL RESECTION OF THE PROSTATE (TURP);  Surgeon: Irine Seal, MD;  Location: [facility];  Service: Urology;  Laterality: N/A;       Home Medications    Prior to Admission medications   Medication Sig Start Date End Date Taking? Authorizing Provider  albuterol (ACCUNEB) 0.63 MG/3ML nebulizer solution Take 1 ampule by nebulization every 6 (six) hours as needed for wheezing.   Yes [provider]  aspirin EC 81 MG tablet Take 81 mg by mouth daily.    Yes [provider]  clonazePAM (KLONOPIN) 0.5 MG tablet Take 1 tablet (0.5 mg total) by mouth 2 (two) times daily as needed. for anxiety 05/21/17  Yes Luking, Elayne Snare, MD  HYDROcodone-acetaminophen (NORCO) 5-325 MG tablet Take 1 tablet by mouth every 6 (six) hours as needed for moderate pain. 05/11/17  Yes Fanny Skates, MD  ondansetron (ZOFRAN) 8 MG tablet One po TID for N&V Patient taking differently: Take 8 mg by mouth 3 (three) times daily.  06/10/17  Yes [name], MD  SPIRIVA HANDIHALER 18 MCG inhalation capsule PLACE 1 CAPSULE INTO INHALER AND INHALE DAILY 06/19/17  Yes Luking, Elayne Snare, MD  torsemide (DEMADEX) 20 MG tablet One po Q other am Patient taking differently: Take 20 mg by mouth every other day. In the morning 06/11/17  Yes Luking, Elayne Snare, MD  VENTOLIN HFA 108 (90 Base) MCG/ACT inhaler  INHALE 2 PUFFS BY MOUTH EVERY 4 HOURS AS NEEDED FOR WHEEZING OR SHORTNESS OF BREATH. 04/18/17  Yes Mikey Kirschner, MD  ondansetron (ZOFRAN) 4 MG tablet Take 1 tablet (4 mg total) by mouth every 8 (eight) hours as needed for nausea or vomiting. Patient not taking: Reported on 08/04/2017 05/21/17 05/21/18  [name], MD    Family History Family History  Problem Relation Age of Onset  . Hypertension Brother   . Heart disease Brother        Heart Disease before age 74  . Heart attack Brother   . Cancer Brother   . Cancer Mother   . Diabetes Mother   . Heart disease Mother        Heart Disease before age 23  . Hypertension Mother   . Heart attack Mother   . Cancer Father        prostate  . Hypertension Father   . COPD Sister   . Diabetes Sister   . Heart disease Sister   . Hypertension  Sister   . Heart attack Sister   . Colon cancer Neg Hx     Social History Social History   Tobacco Use  . Smoking status: Former Smoker    Packs/day: 0.25    Years: 58.00    Pack years: 14.50    Types: Cigarettes    Last attempt to quit: 11/02/2014    Years since quitting: 2.6  . Smokeless tobacco: Never Used  Substance Use Topics  . Alcohol use: No    Alcohol/week: 0.0 oz  . Drug use: No     Allergies   Patient has no known allergies.   Review of Systems Review of Systems  Constitutional: Positive for fatigue. Negative for chills and fever.  Respiratory: Positive for cough. Negative for shortness of breath.   Cardiovascular: Positive for leg swelling. Negative for chest pain and palpitations.  Gastrointestinal: Positive for abdominal distention, constipation, nausea and vomiting. Negative for blood in stool.  Genitourinary: Negative for dysuria and frequency.  Musculoskeletal: Negative for back pain, neck pain and neck stiffness.  Skin: Negative for rash and wound.  Neurological: Positive for weakness and light-headedness.  All other systems reviewed and are  negative.    Physical Exam Updated Vital Signs BP (!) 69/48   Pulse 77   Temp 97.8 F (36.6 C) (Oral)   Resp 18   Wt 62.1 kg (137 lb)   SpO2 (!) 72%   BMI 21.46 kg/m   Physical Exam  Constitutional: He is oriented to person, place, and time. He appears well-developed and well-nourished.  Cachectic appearing  HENT:  Head: Normocephalic and atraumatic.  Mouth/Throat: Oropharynx is clear and moist. No oropharyngeal exudate.  Eyes: EOM are normal. Pupils are equal, round, and reactive to light.  Neck: Normal range of motion. Neck supple.  Cardiovascular: Normal rate and regular rhythm. Exam reveals no gallop and no friction rub.  No murmur heard. Pulmonary/Chest: He has rales.  Tachypnea.  Rales and diminished breath sounds in the left base.  Abdominal: Soft. Bowel sounds are normal. He exhibits distension. There is no tenderness. There is no rebound and no guarding.  Mild diffuse distention.  Musculoskeletal: Normal range of motion. He exhibits edema. He exhibits no tenderness.  1+ bilateral lower extremity edema.  No calf swelling or tenderness or asymmetry.  Neurological: He is alert and oriented to person, place, and time.  Moves all extremities without focal deficit.  Sensation intact.  Skin: Skin is warm and dry. No rash noted. No erythema.  Psychiatric: He has a normal mood and affect. His behavior is normal.  Nursing note and vitals reviewed.    ED Treatments / Results  Labs (all labs ordered are listed, but only abnormal results are displayed) Labs Reviewed  COMPREHENSIVE METABOLIC PANEL - Abnormal; Notable for the following components:      Result Value   Chloride 99 (*)    Glucose, Bld 165 (*)    BUN 22 (*)    Calcium 8.3 (*)    Total Protein 5.0 (*)    Albumin 2.2 (*)    AST 1,584 (*)    ALT 708 (*)    Alkaline Phosphatase 234 (*)    Total Bilirubin 2.6 (*)    All other components within normal limits  CBC WITH DIFFERENTIAL/PLATELET - Abnormal; Notable  for the following components:   WBC 15.4 (*)    RBC 3.58 (*)    Hemoglobin 10.7 (*)    HCT 36.5 (*)    MCV 102.0 (*)  MCHC 29.3 (*)    Platelets 145 (*)    Neutro Abs 14.1 (*)    Lymphs Abs 0.3 (*)    All other components within normal limits  BRAIN NATRIURETIC PEPTIDE - Abnormal; Notable for the following components:   B Natriuretic Peptide 2,078.0 (*)    All other components within normal limits  TROPONIN I - Abnormal; Notable for the following components:   Troponin I 6.63 (*)    All other components within normal limits  PROTIME-INR - Abnormal; Notable for the following components:   Prothrombin Time 26.8 (*)    All other components within normal limits  APTT - Abnormal; Notable for the following components:   aPTT 38 (*)    All other components within normal limits  CBG MONITORING, ED - Abnormal; Notable for the following components:   Glucose-Capillary 55 (*)    All other components within normal limits  I-STAT CG4 LACTIC ACID, ED - Abnormal; Notable for the following components:   Lactic Acid, Venous 5.26 (*)    All other components within normal limits  CBG MONITORING, ED - Abnormal; Notable for the following components:   Glucose-Capillary 137 (*)    All other components within normal limits  CBG MONITORING, ED - Abnormal; Notable for the following components:   Glucose-Capillary 113 (*)    All other components within normal limits  CULTURE, BLOOD (ROUTINE X 2)  CULTURE, BLOOD (ROUTINE X 2)  URINALYSIS, ROUTINE W REFLEX MICROSCOPIC  I-STAT CG4 LACTIC ACID, ED    EKG  EKG Interpretation  Date/Time:  Thursday July 11 2017 21:17:08 EST Ventricular Rate:  91 PR Interval:    QRS Duration: 162 QT Interval:  451 QTC Calculation: 555 R Axis:   -91 Text Interpretation:  Unknown rhythm, irregular rate Nonspecific IVCD with LAD Inferior infarct, old Baseline wander in lead(s) V3 Confirmed by Julianne Rice [phone]) on 07/26/2017 10:37:44 PM       Radiology Dg  Chest Port 1 View  Result Date: 07/23/2017 CLINICAL DATA:  77 year old presenting with hypoglycemia and hypotension. Longstanding shortness of breath and productive cough. Current history of CHF and COPD. EXAM: PORTABLE CHEST 1 VIEW COMPARISON:  07/06/2017, 07/05/2017 and earlier. FINDINGS: Sternotomy for CABG. Cardiac silhouette markedly enlarged, unchanged. Pulmonary venous hypertension and mild interstitial pulmonary edema, improved since the examination 5 days ago. Dense left lower lobe consolidation, unchanged. Moderate-sized bilateral pleural effusions with fluid in the minor fissure on the right. No new abnormalities. IMPRESSION: 1. Mild CHF, though the interstitial pulmonary edema has improved since the examination 5 days ago. 2. Stable moderately large bilateral pleural effusions. Fluid is now present in the major fissure on the right. 3. Stable dense left lower lobe atelectasis and/or pneumonia. 4. No new abnormalities. Electronically Signed   By: Evangeline Dakin M.D.   On: 07/14/2017 21:57    Procedures Procedures (including critical care time)  Medications Ordered in ED Medications  piperacillin-tazobactam (ZOSYN) IVPB 3.375 g (not administered)  ondansetron (ZOFRAN) 4 MG/2ML injection (not administered)  vancomycin (VANCOCIN) 500 mg in sodium chloride 0.9 % 100 mL IVPB (not administered)  dextrose 50 % solution 50 mL (50 mLs Intravenous Given 08/03/2017 2122)  sodium chloride 0.9 % bolus 1,000 mL (0 mLs Intravenous Stopped 07/23/2017 2217)    And  sodium chloride 0.9 % bolus 1,000 mL (1,000 mLs Intravenous New Bag/Given 07/16/2017 2245)  piperacillin-tazobactam (ZOSYN) IVPB 3.375 g (0 g Intravenous Stopped 07/28/2017 2338)  vancomycin (VANCOCIN) IVPB 1000 mg/200 mL premix (1,000 mg Intravenous  New Bag/Given 08/04/2017 2244)  ondansetron (ZOFRAN) injection 4 mg (4 mg Intravenous Given 07/10/2017 2145)  iopamidol (ISOVUE-370) 76 % injection 100 mL (100 mLs Intravenous Contrast Given 08/02/2017 2320)   CRITICAL  CARE Performed by: Julianne Rice Total critical care time: 90 minutes Critical care time was exclusive of separately billable procedures and treating other patients. Critical care was necessary to treat or prevent imminent or life-threatening deterioration. Critical care was time spent personally by me on the following activities: development of treatment plan with patient and/or surrogate as well as nursing, discussions with consultants, evaluation of patient's response to treatment, examination of patient, obtaining history from patient or surrogate, ordering and performing treatments and interventions, ordering and review of laboratory studies, ordering and review of radiographic studies, pulse oximetry and re-evaluation of patient's condition.  Initial Impression / Assessment and Plan / ED Course  I have reviewed the triage vital signs and the nursing notes.  Pertinent labs & imaging results that were available during my care of the patient were reviewed by me and considered in my medical decision making (see chart for details).    Initiated sepsis protocol.  Blood pressure remains low despite IV fluids.  Discussed with family and confirmed DNR/DNI status of the patient.  They are aware of seriousness of his condition.  Patient also was requiring increased oxygen.  Will start on BiPAP.   Evidence of multiorgan failure.  Discussed with hospitalist will see patient in the emergency department and admit. Final Clinical Impressions(s) / ED Diagnoses   Final diagnoses:  Severe sepsis ([place])  Acute respiratory failure with hypoxia [facility])    ED Discharge Orders    None       Julianne [place], MD 07/29/2017 2351

## 2017-07-11 NOTE — ED Triage Notes (Signed)
Pt seen here on March 5th for GI problems, tonight with Hypoglycemia and Hypotension per ems.  Pt is awake and alert at this time

## 2017-07-11 NOTE — ED Notes (Signed)
Dr. Lita Mains notified of multiple attempts to obtain blood from pt. Orders given for arterial stick. In addition, was told not to wait on blood cultures to hang antibiotics that were ordered.

## 2017-07-11 NOTE — Progress Notes (Signed)
Pharmacy Antibiotic Note  Tommy Turner is a 77 y.o. male admitted on 07/20/2017 with sepsis.  Pharmacy has been consulted for Vancomycin and Zosyn dosing.  Plan: Vancomycin 1000 mg IV x 1 dose Vancomycin 500 mg IV every 12 hours.  Goal trough 15-20 mcg/mL. Zosyn 3.375g IV q8h (4 hour infusion).  Monitor labs, cultures, and patient improvement.  Weight: 137 lb (62.1 kg)  Temp (24hrs), Avg:97.8 F (36.6 C), Min:97.8 F (36.6 C), Max:97.8 F (36.6 C)  Recent Labs  Lab 07/05/17 0920 07/06/17 0624 07/07/17 0643 07/08/17 0552  WBC 8.5 6.5  --   --   CREATININE 0.45* 0.42* 0.46* 0.40*    Estimated Creatinine Clearance: 67.9 mL/min (A) (by C-G formula based on SCr of 0.4 mg/dL (L)).    No Known Allergies  Antimicrobials this admission: Vanco 3/7 >>  Zosyn 3/7 >>   Dose adjustments this admission: N/A  Microbiology results: 3/7 BCx: pending  Thank you for allowing pharmacy to be a part of this patient's care.  Margot Ables, PharmD Clinical Pharmacist 07/10/2017 9:56 PM

## 2017-07-12 ENCOUNTER — Encounter (HOSPITAL_COMMUNITY): Payer: Self-pay | Admitting: Primary Care

## 2017-07-12 DIAGNOSIS — I110 Hypertensive heart disease with heart failure: Secondary | ICD-10-CM | POA: Diagnosis present

## 2017-07-12 DIAGNOSIS — K8080 Other cholelithiasis without obstruction: Secondary | ICD-10-CM | POA: Diagnosis present

## 2017-07-12 DIAGNOSIS — R6521 Severe sepsis with septic shock: Secondary | ICD-10-CM | POA: Diagnosis present

## 2017-07-12 DIAGNOSIS — K5900 Constipation, unspecified: Secondary | ICD-10-CM | POA: Diagnosis present

## 2017-07-12 DIAGNOSIS — I5022 Chronic systolic (congestive) heart failure: Secondary | ICD-10-CM | POA: Diagnosis present

## 2017-07-12 DIAGNOSIS — R571 Hypovolemic shock: Secondary | ICD-10-CM | POA: Diagnosis present

## 2017-07-12 DIAGNOSIS — R188 Other ascites: Secondary | ICD-10-CM | POA: Diagnosis present

## 2017-07-12 DIAGNOSIS — E785 Hyperlipidemia, unspecified: Secondary | ICD-10-CM | POA: Diagnosis present

## 2017-07-12 DIAGNOSIS — I714 Abdominal aortic aneurysm, without rupture: Secondary | ICD-10-CM | POA: Diagnosis present

## 2017-07-12 DIAGNOSIS — A419 Sepsis, unspecified organism: Secondary | ICD-10-CM | POA: Diagnosis not present

## 2017-07-12 DIAGNOSIS — J9601 Acute respiratory failure with hypoxia: Secondary | ICD-10-CM | POA: Diagnosis not present

## 2017-07-12 DIAGNOSIS — I2510 Atherosclerotic heart disease of native coronary artery without angina pectoris: Secondary | ICD-10-CM | POA: Diagnosis present

## 2017-07-12 DIAGNOSIS — Z87891 Personal history of nicotine dependence: Secondary | ICD-10-CM | POA: Diagnosis not present

## 2017-07-12 DIAGNOSIS — J189 Pneumonia, unspecified organism: Secondary | ICD-10-CM | POA: Diagnosis present

## 2017-07-12 DIAGNOSIS — I739 Peripheral vascular disease, unspecified: Secondary | ICD-10-CM | POA: Diagnosis present

## 2017-07-12 DIAGNOSIS — I252 Old myocardial infarction: Secondary | ICD-10-CM | POA: Diagnosis not present

## 2017-07-12 DIAGNOSIS — Z66 Do not resuscitate: Secondary | ICD-10-CM | POA: Diagnosis present

## 2017-07-12 DIAGNOSIS — E162 Hypoglycemia, unspecified: Secondary | ICD-10-CM | POA: Diagnosis present

## 2017-07-12 DIAGNOSIS — R6520 Severe sepsis without septic shock: Secondary | ICD-10-CM | POA: Diagnosis not present

## 2017-07-12 DIAGNOSIS — J9621 Acute and chronic respiratory failure with hypoxia: Secondary | ICD-10-CM | POA: Diagnosis present

## 2017-07-12 DIAGNOSIS — Z515 Encounter for palliative care: Secondary | ICD-10-CM | POA: Diagnosis present

## 2017-07-12 DIAGNOSIS — Z9981 Dependence on supplemental oxygen: Secondary | ICD-10-CM | POA: Diagnosis not present

## 2017-07-12 DIAGNOSIS — J449 Chronic obstructive pulmonary disease, unspecified: Secondary | ICD-10-CM | POA: Diagnosis present

## 2017-07-12 DIAGNOSIS — F419 Anxiety disorder, unspecified: Secondary | ICD-10-CM | POA: Diagnosis present

## 2017-07-12 DIAGNOSIS — I249 Acute ischemic heart disease, unspecified: Secondary | ICD-10-CM | POA: Diagnosis present

## 2017-07-12 DIAGNOSIS — Y95 Nosocomial condition: Secondary | ICD-10-CM | POA: Diagnosis present

## 2017-07-12 DIAGNOSIS — R531 Weakness: Secondary | ICD-10-CM | POA: Diagnosis present

## 2017-07-12 DIAGNOSIS — F329 Major depressive disorder, single episode, unspecified: Secondary | ICD-10-CM | POA: Diagnosis present

## 2017-07-12 MED ORDER — ALBUTEROL SULFATE (2.5 MG/3ML) 0.083% IN NEBU: 3.0000 mL | INHALATION_SOLUTION | Freq: Four times a day (QID) | RESPIRATORY_TRACT | Status: DC | PRN

## 2017-07-12 MED ORDER — ONDANSETRON HCL 4 MG PO TABS
4.0000 mg | ORAL_TABLET | Freq: Four times a day (QID) | ORAL | Status: DC | PRN
  Administered 2017-07-12: 4 mg via ORAL

## 2017-07-12 MED ORDER — ONDANSETRON HCL 4 MG/2ML IJ SOLN: 4.0000 mg | Freq: Four times a day (QID) | INTRAMUSCULAR | Status: DC | PRN

## 2017-07-12 MED ORDER — ONDANSETRON HCL 4 MG/2ML IJ SOLN
INTRAMUSCULAR | Status: AC
  Filled 2017-07-12: qty 2

## 2017-07-12 MED ORDER — IPRATROPIUM-ALBUTEROL 0.5-2.5 (3) MG/3ML IN SOLN
3.0000 mL | Freq: Four times a day (QID) | RESPIRATORY_TRACT | Status: DC
  Administered 2017-07-12 (×2): 3 mL via RESPIRATORY_TRACT
  Filled 2017-07-12 (×2): qty 3

## 2017-07-12 MED ORDER — MORPHINE SULFATE (PF) 2 MG/ML IV SOLN
1.0000 mg | INTRAVENOUS | Status: DC | PRN
  Administered 2017-07-12 (×2): 1 mg via INTRAVENOUS
  Filled 2017-07-12 (×2): qty 1

## 2017-07-12 MED ORDER — SODIUM CHLORIDE 0.9 % IV SOLN: INTRAVENOUS | Status: DC

## 2017-07-12 MED ORDER — LORAZEPAM 2 MG/ML IJ SOLN
1.0000 mg | INTRAMUSCULAR | Status: DC | PRN
  Administered 2017-07-12: 1 mg via INTRAVENOUS
  Filled 2017-07-12: qty 1

## 2017-07-15 ENCOUNTER — Other Ambulatory Visit: Payer: Self-pay | Admitting: *Deleted

## 2017-07-15 NOTE — Patient Outreach (Signed)
[facility]) Care Management  07/15/2017  Tommy Turner 1941-02-29 [id number]   Received [facility] Transition of care referral on 07/08/17, patient expired on 07/22/2017, and will proceed with case closure.   RNCM will send case closure request to [facility] [place] [facility].     Kirby Cortese H. Annia Friendly, BSN, [place] [facility] Telephonic CM Phone: [phone] Fax: [phone]

## 2017-07-16 ENCOUNTER — Ambulatory Visit: Payer: 59 | Admitting: Family Medicine

## 2017-07-16 LAB — CULTURE, BLOOD (ROUTINE X 2)
CULTURE: NO GROWTH
Culture: NO GROWTH
Special Requests: ADEQUATE
Special Requests: ADEQUATE

## 2017-08-05 NOTE — Progress Notes (Signed)
Pt deceased at 1021. Verified by two RN B. Jacobus Colvin, RN and L. Bullins, Therapist, sports. MD notified, will sign death certificate. CDS contacted, suitable for tissues. They will contact family. Pt to be picked up in the room by funeral home.

## 2017-08-05 NOTE — H&P (Addendum)
TRH H&P    Patient Demographics:    Tommy Turner, is a 77 y.o. male  MRN: [id number]  DOB - 04/12/1941  Admit Date - 07/24/2017  Referring MD/NP/PA: Julianne Rice  Outpatient Primary MD for the patient is [name], MD  Patient coming from: Home  Chief complaint-vomiting   HPI:    Tommy Turner  is a 77 y.o. male, with history of chronic systolic CHF with EF 88%, peripheral vascular disease, COPD with chronic hypoxic respiratory failure on 2 L home oxygen, left incarcerated inguinal hernia repair with exploratory laparotomy and small bowel obstruction on 04/08/2017 followed by small bowel obstruction about 3 weeks later for which she was hospitalized and managed conservatively.  Patient again was hospitalized on July 05, 2017 for small bowel obstruction which was managed conservatively and discharged  July 09, 2017. Today patient was brought to the hospital  For generalized weakness, vomiting dark colored vomitus.  Patient in the ED was found to be hypoglycemic and hypotensive.  Patient is alert and oriented x3, denies any pain or shortness of breath. Lab work revealed sepsis with lactic acid 5.26, WBC 15.4, BNP 2078, troponin 6.63. AST 1,584, ALT 708, alk phos 234  Patient was initially started on IV fluids along with IV antibiotics. ED physician Dr. Lita Mains discussed with patient family, regarding poor prognosis. Patient was made DNR. Patient denies any chest pain or shortness of breath. No history of dysuria, no abdominal pain. Has been vomiting no diarrhea.   Review of systems:      All other systems reviewed and are negative.   With Past History of the following :    Past Medical History:  Diagnosis Date  . AAA (abdominal aortic aneurysm) ([place])   . Aneurysm ([employer])   . Anxiety    takes Xanax daily as needed  . CAD (coronary artery disease)   . CHF (congestive heart failure) ([place])     . COPD (chronic obstructive pulmonary disease) ([facility])    Albuterol inhaler and neb daily as needed;takes SPiriva daily  . Depression    takes Celexa daily  . History of gastric ulcer    gastric  . History of shingles ~ 2008  . Hyperlipidemia    takes Atorvastatin daily  . Hypertension    takes Metoprolol and Benazepril  daily  . Myocardial infarction ([place]) 1990s X 1  . On home oxygen therapy    "3L; 24/7" (05/01/2017)  . Partial small bowel obstruction ([place]) 04/30/2017  . Peripheral edema    takes Furosemide daily  . Shortness of breath     on oxygen  2 liters continuous      Past Surgical History:  Procedure Laterality Date  . ABDOMINAL AORTIC ENDOVASCULAR STENT GRAFT N/A 09/14/2014   Procedure: ABDOMINAL AORTIC ENDOVASCULAR STENT GRAFT;  Surgeon: Conrad Lead Hill, MD;  Location: [facility];  Service: Vascular;  Laterality: N/A;  . BOWEL RESECTION  04/08/2017   Procedure: SMALL BOWEL RESECTION;  Surgeon: Erroll Luna, MD;  Location: [facility];  Service: General;;  . CARDIAC CATHETERIZATION  1990s X 1  . CORONARY ARTERY BYPASS GRAFT  1990s   CABG X6  . ESOPHAGOGASTRODUODENOSCOPY    . FEMORAL HERNIA REPAIR Left 04/08/2017   Procedure: HERNIA REPAIR FEMORAL;  Surgeon: Erroll Luna, MD;  Location: [facility];  Service: General;  Laterality: Left;  . HERNIA REPAIR Left    inguinal  . INSERTION OF MESH  04/08/2017   Procedure: INSERTION OF MESH;  Surgeon: Erroll Luna, MD;  Location: [facility];  Service: General;;  . LAPAROTOMY  04/08/2017   Procedure: EXPLORATORY LAPAROTOMY;  Surgeon: Erroll Luna, MD;  Location: [facility];  Service: General;;  . PARTIAL GASTRECTOMY     approx 1990  . TONSILLECTOMY     as a child  . TRANSURETHRAL RESECTION OF PROSTATE N/A 11/24/2015   Procedure: TRANSURETHRAL RESECTION OF THE PROSTATE (TURP);  Surgeon: Irine Seal, MD;  Location: [facility];  Service: Urology;  Laterality: N/A;      Social History:      Social History   Tobacco Use  . Smoking status:  Former Smoker    Packs/day: 0.25    Years: 58.00    Pack years: 14.50    Types: Cigarettes    Last attempt to quit: 11/02/2014    Years since quitting: 2.6  . Smokeless tobacco: Never Used  Substance Use Topics  . Alcohol use: No    Alcohol/week: 0.0 oz       Family History :     Family History  Problem Relation Age of Onset  . Hypertension Brother   . Heart disease Brother        Heart Disease before age 65  . Heart attack Brother   . Cancer Brother   . Cancer Mother   . Diabetes Mother   . Heart disease Mother        Heart Disease before age 71  . Hypertension Mother   . Heart attack Mother   . Cancer Father        prostate  . Hypertension Father   . COPD Sister   . Diabetes Sister   . Heart disease Sister   . Hypertension Sister   . Heart attack Sister   . Colon cancer Neg Hx       Home Medications:   Prior to Admission medications   Medication Sig Start Date End Date Taking? Authorizing Provider  albuterol (ACCUNEB) 0.63 MG/3ML nebulizer solution Take 1 ampule by nebulization every 6 (six) hours as needed for wheezing.   Yes [provider]  aspirin EC 81 MG tablet Take 81 mg by mouth daily.    Yes [provider]  clonazePAM (KLONOPIN) 0.5 MG tablet Take 1 tablet (0.5 mg total) by mouth 2 (two) times daily as needed. for anxiety 05/21/17  Yes Luking, Elayne Snare, MD  HYDROcodone-acetaminophen (NORCO) 5-325 MG tablet Take 1 tablet by mouth every 6 (six) hours as needed for moderate pain. 05/11/17  Yes Fanny Skates, MD  ondansetron (ZOFRAN) 8 MG tablet One po TID for N&V Patient taking differently: Take 8 mg by mouth 3 (three) times daily.  06/10/17  Yes [name], MD  SPIRIVA HANDIHALER 18 MCG inhalation capsule PLACE 1 CAPSULE INTO INHALER AND INHALE DAILY 06/19/17  Yes Luking, Elayne Snare, MD  torsemide (DEMADEX) 20 MG tablet One po Q other am Patient taking differently: Take 20 mg by mouth every other day. In the morning 06/11/17  Yes Luking,  Elayne Snare, MD  VENTOLIN HFA 108 (90 Base) MCG/ACT inhaler INHALE  2 PUFFS BY MOUTH EVERY 4 HOURS AS NEEDED FOR WHEEZING OR SHORTNESS OF BREATH. 04/18/17  Yes Mikey Kirschner, MD  ondansetron (ZOFRAN) 4 MG tablet Take 1 tablet (4 mg total) by mouth every 8 (eight) hours as needed for nausea or vomiting. Patient not taking: Reported on 07/18/2017 05/21/17 05/21/18  [name], MD     Allergies:    No Known Allergies   Physical Exam:   Vitals  Blood pressure (!) 69/48, pulse 77, temperature 97.8 F (36.6 C), temperature source Oral, resp. rate 18, weight 62.1 kg (137 lb), SpO2 (!) 72 %.  1.  General: Appears in no acute distress  2. Psychiatric:  Intact judgement and  insight, awake alert, oriented x 3.  3. Neurologic: No focal neurological deficits, all cranial nerves intact.Strength 5/5 all 4 extremities, sensation intact all 4 extremities, plantars down going.  4. Eyes :  anicteric sclerae, moist conjunctivae with no lid lag. PERRLA.  5. ENMT:  Oropharynx clear with moist mucous membranes and good dentition  6. Neck:  supple, no cervical lymphadenopathy appriciated, No thyromegaly  7. Respiratory : Normal respiratory effort, decreased breath sounds bilaterally  8. Cardiovascular : RRR, no gallops, rubs or murmurs, no leg edema  9. Gastrointestinal:  Positive bowel sounds, abdomen soft, non-tender to palpation,no hepatosplenomegaly, no rigidity or guarding       10. Skin:  No cyanosis, normal texture and turgor, no rash, lesions or ulcers  11.Musculoskeletal:  Good muscle tone,  joints appear normal , no effusions,  normal range of motion    Data Review:    CBC Recent Labs  Lab 07/05/17 0920 07/06/17 0624 07/22/2017 2302  WBC 8.5 6.5 15.4*  HGB 11.6* 11.9* 10.7*  HCT 38.4* 40.8 36.5*  PLT 159 177 145*  MCV 99.5 101.7* 102.0*  MCH 30.1 29.7 29.9  MCHC 30.2 29.2* 29.3*  RDW 14.9 15.2 14.9  LYMPHSABS 0.7  --  0.3*  MONOABS 0.9  --  1.0  EOSABS 0.0  --   0.0  BASOSABS 0.0  --  0.0   ------------------------------------------------------------------------------------------------------------------  Chemistries  Recent Labs  Lab 07/05/17 0920 07/06/17 0624 07/07/17 0643 07/08/17 0552 07/19/2017 2303  NA 141 144 142 144 142  K 3.0* 3.9 3.8 4.1 3.7  CL 89* 97* 98* 100* 99*  CO2 44* 37* 37* 36* 30  GLUCOSE 118* 65 107* 138* 165*  BUN '18 15 14 12 ' 22*  CREATININE 0.45* 0.42* 0.46* 0.40* 0.96  CALCIUM 8.8* 9.0 8.9 9.1 8.3*  MG 1.9  --   --   --   --   AST 16  --   --   --  1,584*  ALT 11*  --   --   --  708*  ALKPHOS 56  --   --   --  234*  BILITOT 0.6  --   --   --  2.6*   ------------------------------------------------------------------------------------------------------------------  ------------------------------------------------------------------------------------------------------------------ GFR: Estimated Creatinine Clearance: 56.6 mL/min (by C-G formula based on SCr of 0.96 mg/dL). Liver Function Tests: Recent Labs  Lab 07/05/17 0920 07/09/2017 2303  AST 16 1,584*  ALT 11* 708*  ALKPHOS 56 234*  BILITOT 0.6 2.6*  PROT 5.6* 5.0*  ALBUMIN 2.7* 2.2*   No results for input(s): LIPASE, AMYLASE in the last 168 hours. No results for input(s): AMMONIA in the last 168 hours. Coagulation Profile: Recent Labs  Lab 07/28/2017 2303  INR 2.50   Cardiac Enzymes: Recent Labs  Lab 07/16/2017 2303  TROPONINI 6.63*  BNP (last 3 results) No results for input(s): PROBNP in the last 8760 hours. HbA1C: No results for input(s): HGBA1C in the last 72 hours. CBG: Recent Labs  Lab 07/08/17 0732 07/09/17 0731 07/18/2017 2115 07/23/2017 2155 07/14/2017 2308  GLUCAP 130* 91 55* 137* 113*   Lipid Profile: No results for input(s): CHOL, HDL, LDLCALC, TRIG, CHOLHDL, LDLDIRECT in the last 72 hours. Thyroid Function Tests: No results for input(s): TSH, T4TOTAL, FREET4, T3FREE, THYROIDAB in the last 72 hours. Anemia Panel: No results  for input(s): VITAMINB12, FOLATE, FERRITIN, TIBC, IRON, RETICCTPCT in the last 72 hours.  --------------------------------------------------------------------------------------------------------------- Urine analysis:    Component Value Date/Time   COLORURINE AMBER (A) 07/05/2017 0937   APPEARANCEUR HAZY (A) 07/05/2017 0937   LABSPEC 1.020 07/05/2017 0937   PHURINE 6.0 07/05/2017 0937   GLUCOSEU NEGATIVE 07/05/2017 0937   HGBUR NEGATIVE 07/05/2017 0937   BILIRUBINUR NEGATIVE 07/05/2017 0937   KETONESUR NEGATIVE 07/05/2017 0937   PROTEINUR 30 (A) 07/05/2017 0937   UROBILINOGEN 1.0 09/10/2014 0919   NITRITE NEGATIVE 07/05/2017 0937   LEUKOCYTESUR NEGATIVE 07/05/2017 6160      Imaging Results:    Dg Chest Port 1 View  Result Date: 07/25/2017 CLINICAL DATA:  77 year old presenting with hypoglycemia and hypotension. Longstanding shortness of breath and productive cough. Current history of CHF and COPD. EXAM: PORTABLE CHEST 1 VIEW COMPARISON:  07/06/2017, 07/05/2017 and earlier. FINDINGS: Sternotomy for CABG. Cardiac silhouette markedly enlarged, unchanged. Pulmonary venous hypertension and mild interstitial pulmonary edema, improved since the examination 5 days ago. Dense left lower lobe consolidation, unchanged. Moderate-sized bilateral pleural effusions with fluid in the minor fissure on the right. No new abnormalities. IMPRESSION: 1. Mild CHF, though the interstitial pulmonary edema has improved since the examination 5 days ago. 2. Stable moderately large bilateral pleural effusions. Fluid is now present in the major fissure on the right. 3. Stable dense left lower lobe atelectasis and/or pneumonia. 4. No new abnormalities. Electronically Signed   By: Evangeline Dakin M.D.   On: 07/22/2017 21:57    My personal review of EKG: Rhythm-intraventricular conduction delay, irregular rhythm   Assessment & Plan:    Active Problems:   Sepsis ([place])   1. Septic shock-unclear etiology, lactic  acid is 5.26, AST 1,584, ALT 708, alk phos 234,patient has very poor prognosis considering abnormal labs, blood pressure is 86/54.  Patient was initially started on IV antibiotics, vancomycin and Zosyn and aggressive IV fluids.  Patient continues to vomit.  CT abdomen pelvis and chest has been done, which showed elevated right heart pressure, mucous plugging in the left lower bronchi with suspected atelectasis, mild partial consolidation in the posterior right upper lobe possible small focus of pneumonia.  Gallbladder wall thickening with stones present, pericholecystic fluid, moderate blood diffuse ascites within the abdomen/pelvis, tiny suspected portal venous gas in the left hepatic lobe.  2. Acute coronary syndrome-patient's troponin 6.64, BNP 2078.  He is not a candidate for aggressive intervention.  3. Goals of care-I discussed with patient's daughter in detail, patient has a poor prognosis and would likely not survive this hospitalization.  Patient's daughter wants him to be kept comfortable and not getting too aggressive interventions.  No pressor support,  patient is a DNR/DNI.  Will discontinue IV antibiotics, no labs, start morphine 1 mg IV every 2 hours as needed, Ativan 1 mg IV every 4 hours as needed.  Patient has poor prognosis and has a  prognosis of hours to days.  Anticipate hospital death.    DVT  Prophylaxis-  SCDs  AM Labs Ordered, also please review Full Orders  Family Communication: Admission, patients condition and plan of care including tests being ordered have been discussed with the patient's daughter at bedside who indicate understanding and agree with the plan and Code Status.  Code Status:  DNR  Admission status: inpatient  Time spent in minutes : 65 min   Oswald Hillock M.D on 2017-08-02 at 12:14 AM  Between 7am to 7pm - Pager - [phone]. After 7pm go to [email or web address] - password [facility]  [facility] - Office  [phone]

## 2017-08-05 NOTE — Discharge Summary (Signed)
Death summary  Patient was a 77 year old man admitted to the hospital on 3/8 with vomiting.  He had a history of chronic systolic heart failure with an ejection fraction of 20%, peripheral vascular disease, COPD with chronic hypoxemic respiratory failure on 2 L of home oxygen, a left incarcerated inguinal hernia repair with exploratory laparotomy on 04/08/17  followed by a small bowel obstruction about 3 weeks later for which he was hospitalized and managed conservatively.  Patient was rehospitalized on March 1 for small bowel obstruction which was again managed conservatively. He returned this time with generalized weakness and dark colored emesis.  In the ED he was found to be hypoglycemic and hypotensive, had a troponin of 6.63, LFTs in the thousands and a lactic acid of 5.26.  Was initially started on IV fluids and antibiotics, subsequent discussions with patient's family members regarding his poor prognosis culminated in him being transitioned over to comfort care.  He never regained consciousness.  He subsequently expired on 3/8 at 10:21 AM.  Causes of death -Presumed septic/hypovolemic shock -Acute coronary syndrome -Presumed hospital-acquired pneumonia   Domingo Mend, MD [facility] Pager: [phone]

## 2017-08-05 DEATH — deceased

## 2017-09-20 ENCOUNTER — Ambulatory Visit: Payer: 59 | Admitting: Family Medicine

## 2017-11-02 IMAGING — CR DG CHEST 1V PORT
1 series · 1 of 1 positions shown · non-contrast
Comparison: 04/08/2016

CLINICAL DATA: Difficulty breathing

EXAM:
PORTABLE CHEST 1 VIEW

[portable]
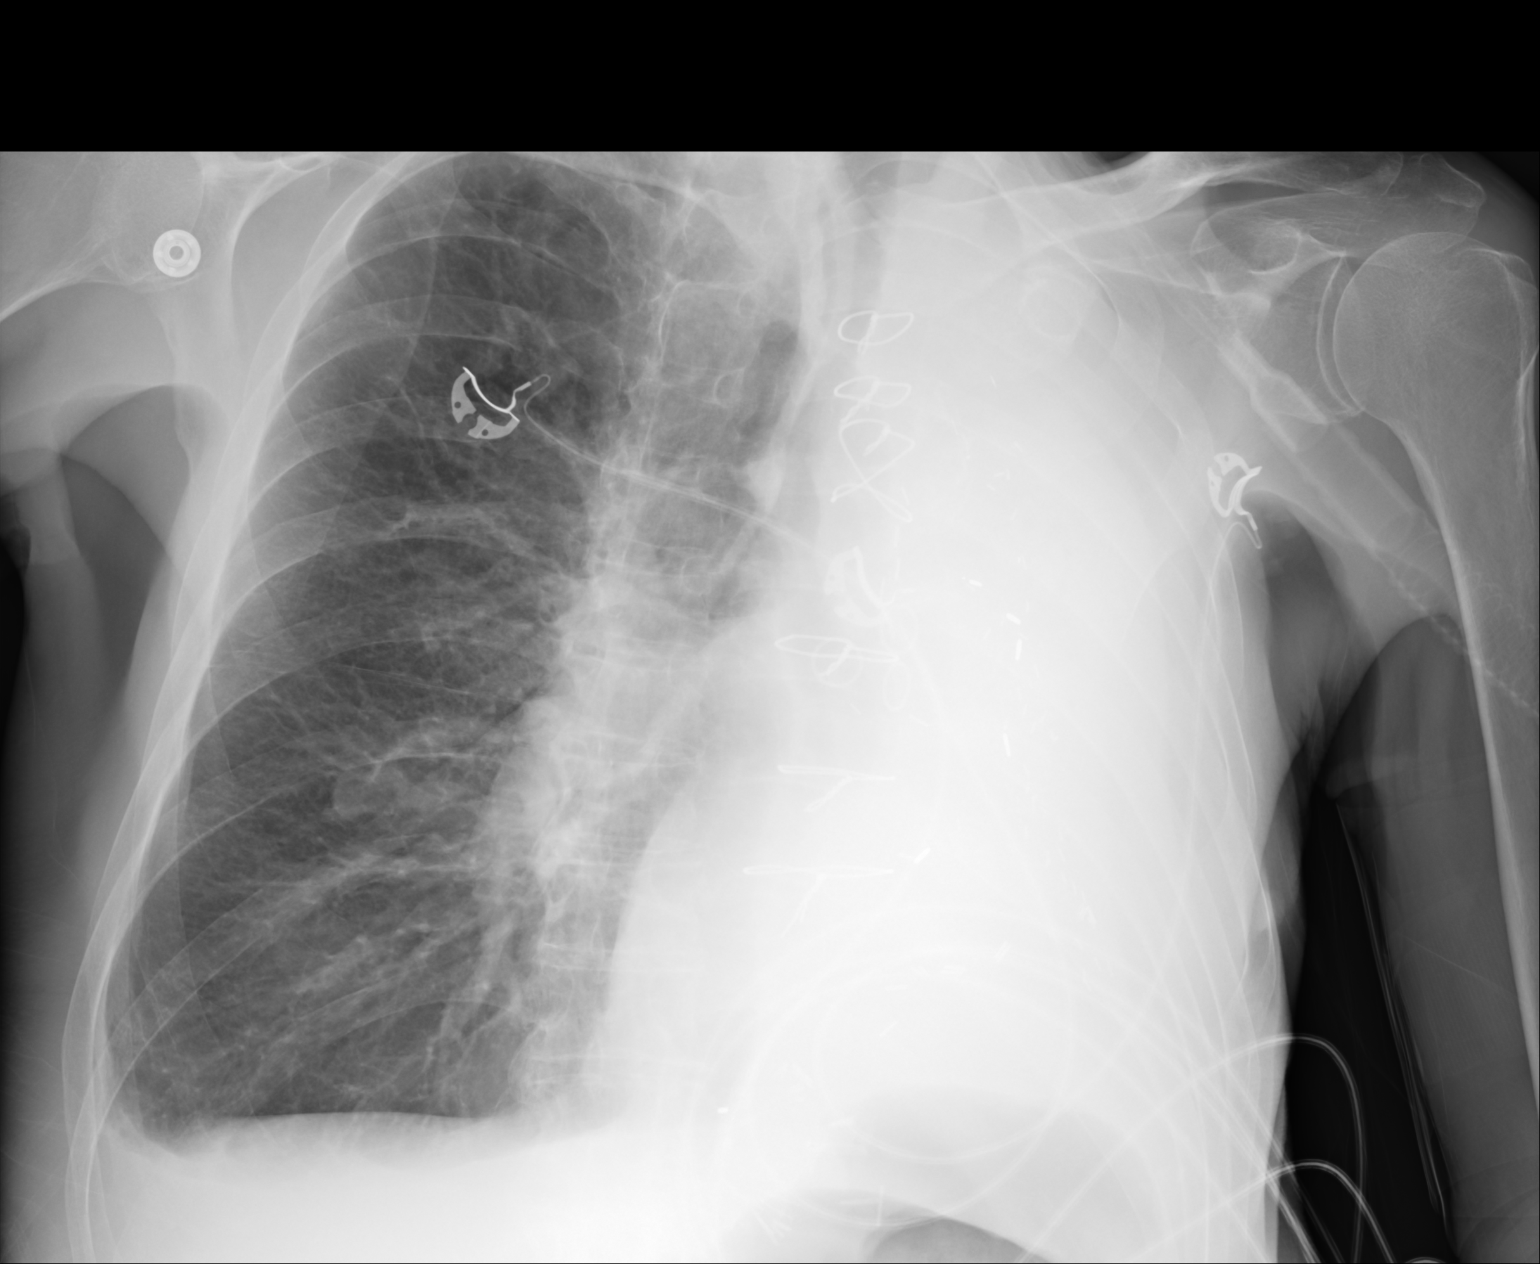

[1 of 1 positions shown; findings below may reference images not displayed]

FINDINGS: Cardiac shadow is obscured by opacification in the left hemi thorax.
There is complete opacification of the left hemi thorax noted with
significant volume loss and midline shift to the left. The right
lung is hyperaerated without focal infiltrate. Some mild
interstitial thickening is noted.
IMPRESSION: Complete opacification of the left hemi thorax with volume loss
likely related to mucous plugging and consolidation. Hyper expansion
of the right lung is noted.

## 2017-11-03 IMAGING — CR DG CHEST 1V PORT
1 series · 1 of 1 positions shown · non-contrast
Comparison: 04/11/16

CLINICAL DATA: Respiratory failure

EXAM:
PORTABLE CHEST 1 VIEW

[portable]
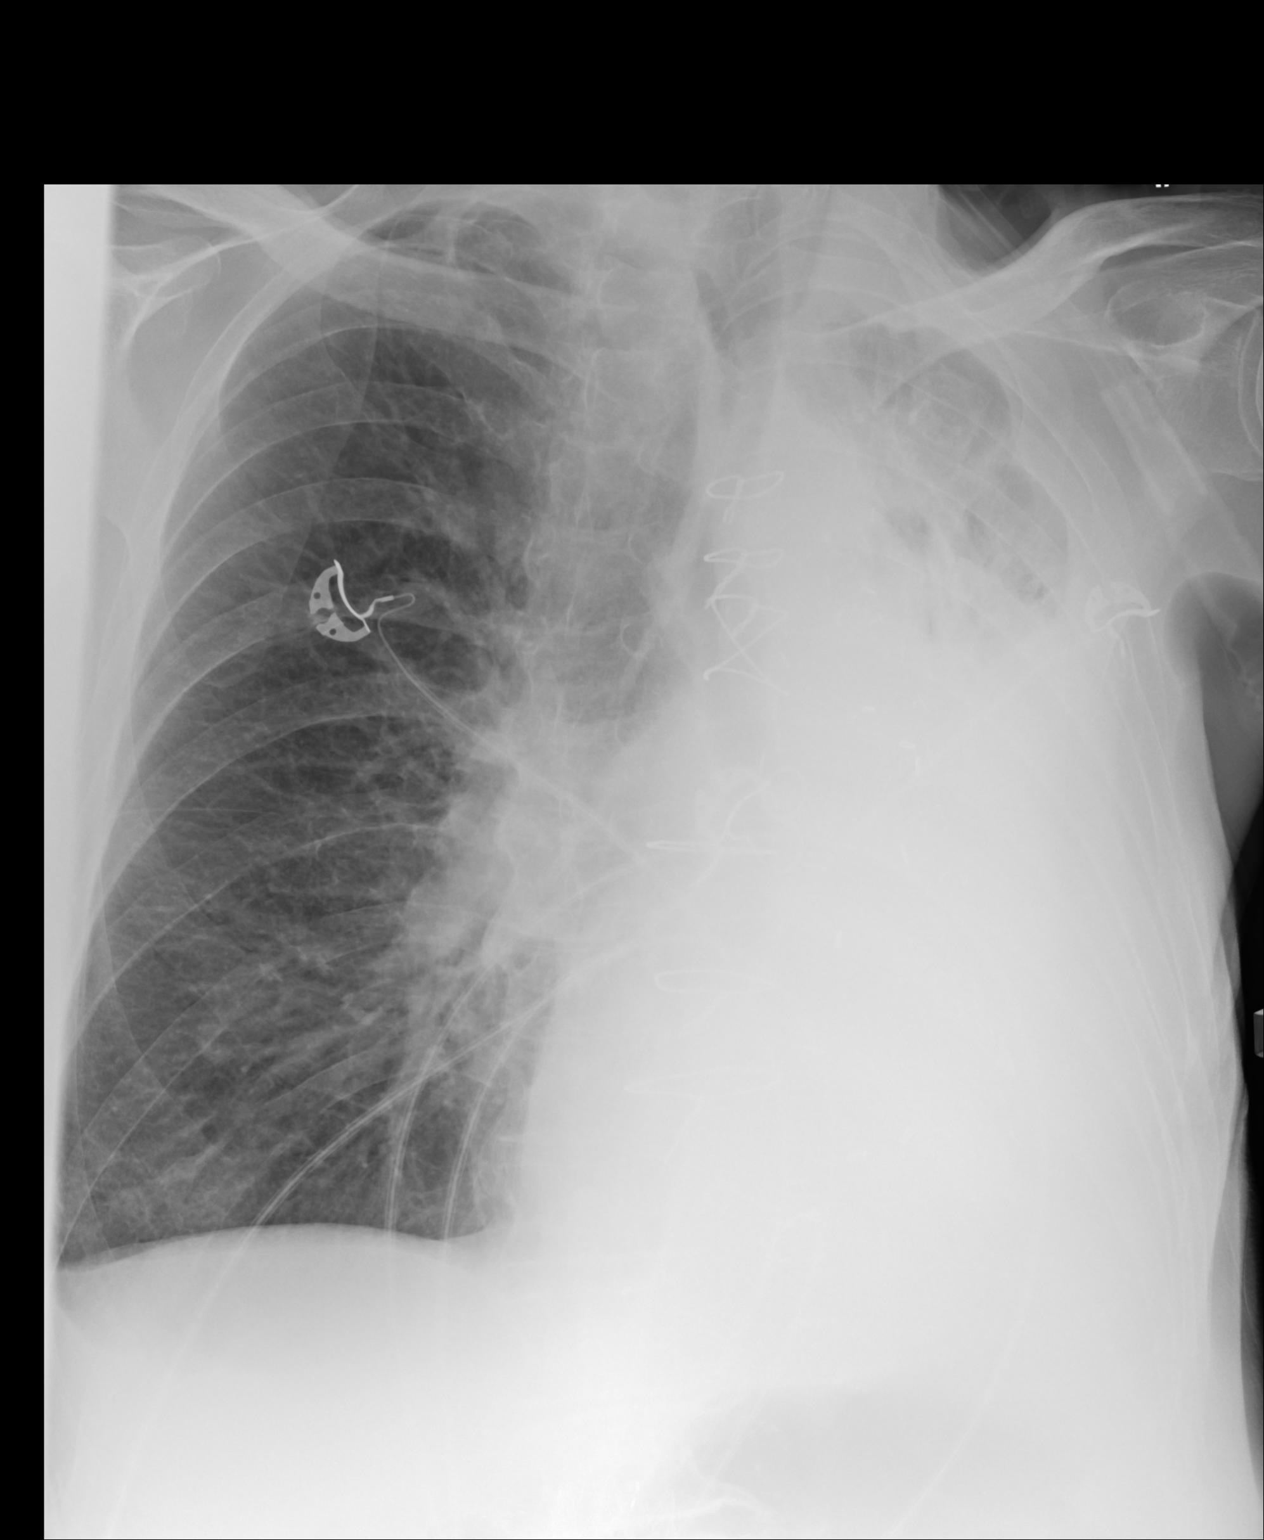

[1 of 1 positions shown; findings below may reference images not displayed]

FINDINGS: Cardiomediastinal silhouette is stable. Again noted almost complete
opacification and volume loss of the left hemi thorax. Minimal
improvement in aeration left upper lobe. Right lung is clear. Status
post median sternotomy
IMPRESSION: Again noted almost complete opacification and volume loss of the
left hemi thorax. Minimal improvement in aeration left upper lobe.
Right lung is clear. Status post median sternotomy

## 2017-11-04 IMAGING — CR DG CHEST 1V PORT
1 series · 1 of 1 positions shown · non-contrast
Comparison: 04/12/2016

CLINICAL DATA: Respiratory failure

EXAM:
PORTABLE CHEST 1 VIEW

[portable]
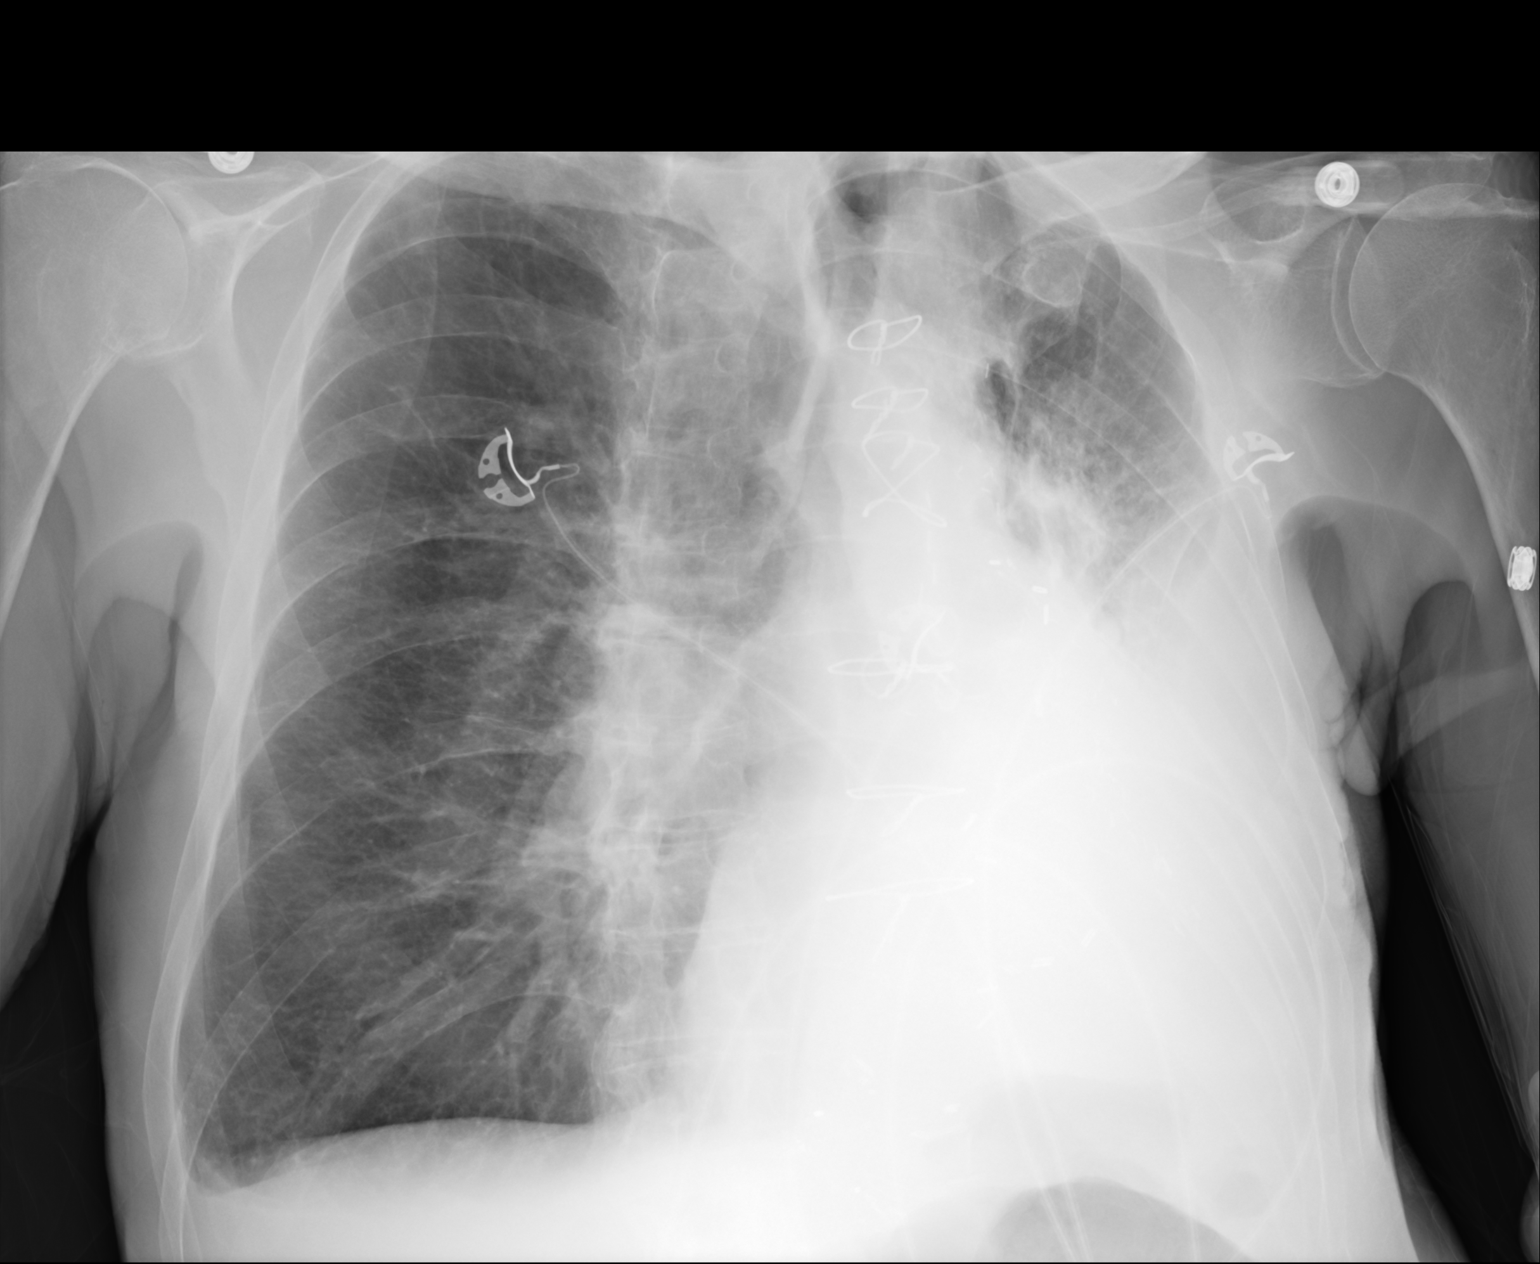

[1 of 1 positions shown; findings below may reference images not displayed]

FINDINGS: Shift of the mediastinum to the left and opacification at the left
base are consistent with significant collapse of the left lower
lobe. Compared to yesterday, there is increased aeration in the left
upper lung zone. The right lung remains hyperaerated and clear other
then chronic parenchymal changes at the right apex. No pneumothorax.
Postoperative changes are noted.
IMPRESSION: There continues to be significant collapse in the left lower lobe
and opacity at the left base. This has slightly improved since
yesterday. Mucous plugging are underlying endobronchial lesion
cannot be excluded.

## 2019-02-01 IMAGING — CT CT ABD-PELV W/ CM
3 of 11 series · 9 of 46 positions shown, 15 images · IV contrast (Isovue)
Comparison: CT 07/05/2017, 04/30/2017, 10/21/2014, CT chest
02/24/2013

CLINICAL DATA: Shortness of breath abdominal pain

EXAM:
CT ANGIOGRAPHY CHEST
CT ABDOMEN AND PELVIS WITH CONTRAST
TECHNIQUE: Multidetector CT imaging of the chest was performed using the
standard protocol during bolus administration of intravenous
contrast. Multiplanar CT image reconstructions and MIPs were
obtained to evaluate the vascular anatomy. Multidetector CT imaging
of the abdomen and pelvis was performed using the standard protocol
during bolus administration of intravenous contrast.
CONTRAST:  100mL ZWWC6C-JHE IOPAMIDOL (ZWWC6C-JHE) INJECTION 76%

[Series 5: thins · axial · 0.75mm/px · z∈[-149,-81]mm · 3 of 258 slices shown]
[im 18/258  soft-tissue]
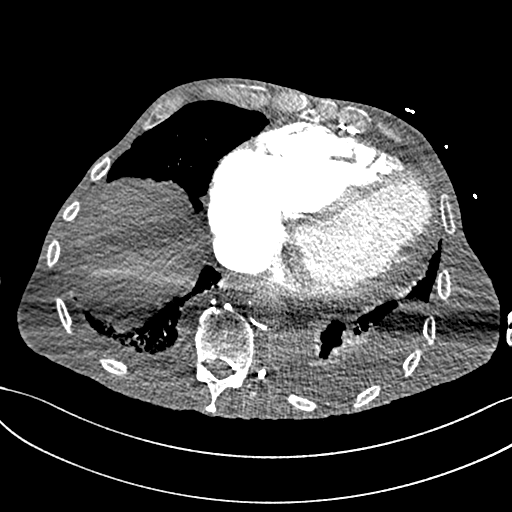
[im 52/258  soft-tissue]
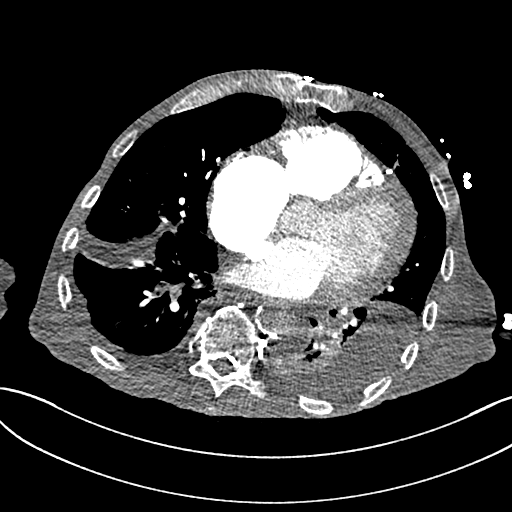
[im 86/258  soft-tissue]
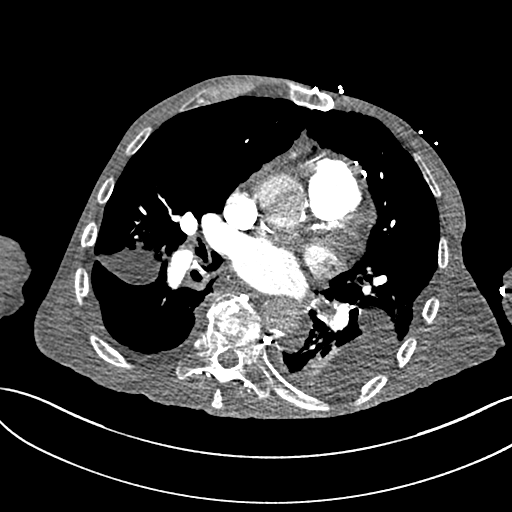

[Series 7: coronal mpr · coronal · 0.54mm/px · 2 of 143 slices shown, 3 images]
[im 48/143  soft-tissue]
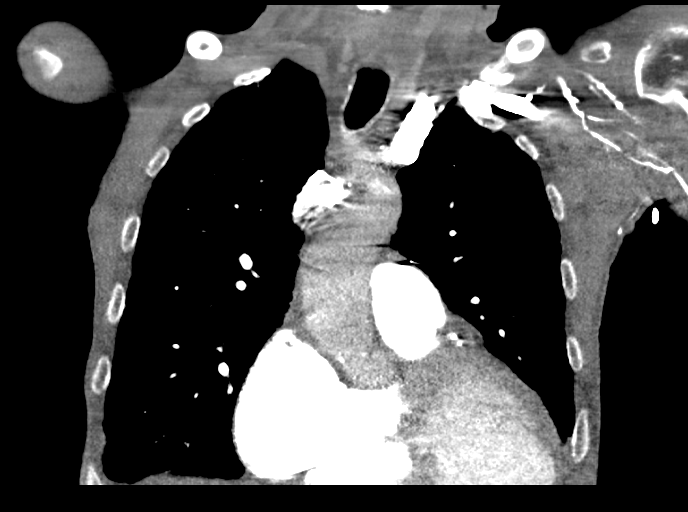
[im 48/143  bone]
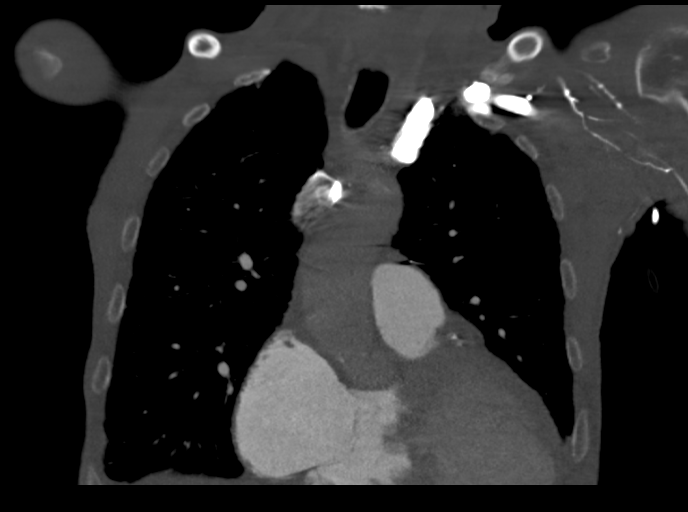
[im 95/143  soft-tissue]
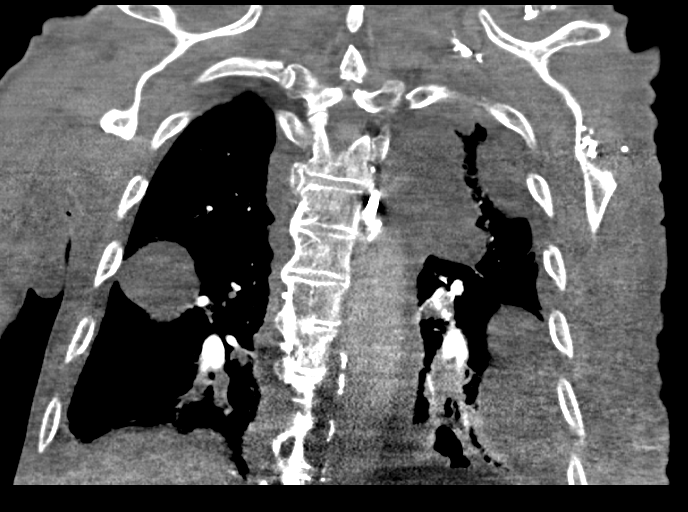

[Series 12: axial st · axial · 0.79mm/px · z∈[-458,-188]mm · 4 of 90 slices shown, 9 images]
[im 18/90  soft-tissue]
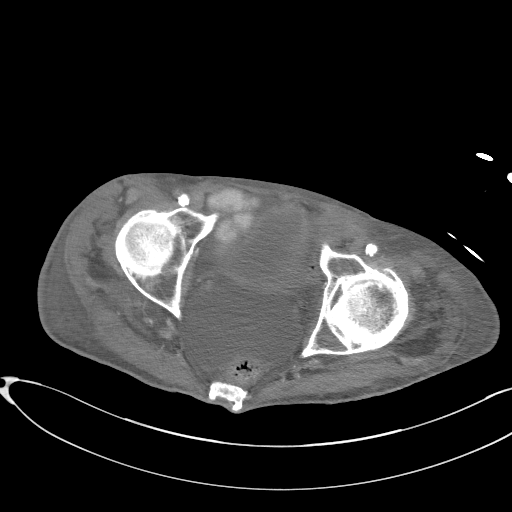
[im 18/90  lung]
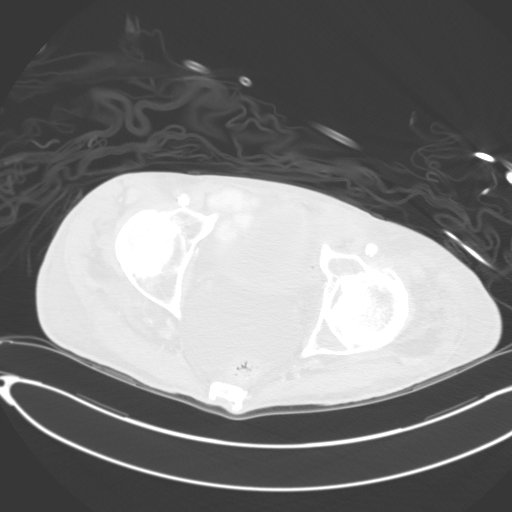
[im 18/90  bone]
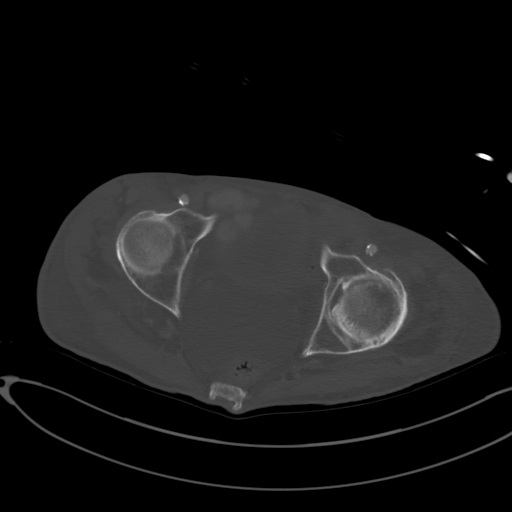
[im 36/90  soft-tissue]
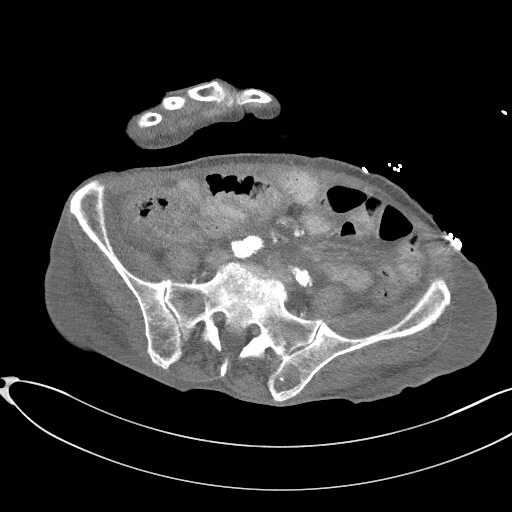
[im 36/90  lung]
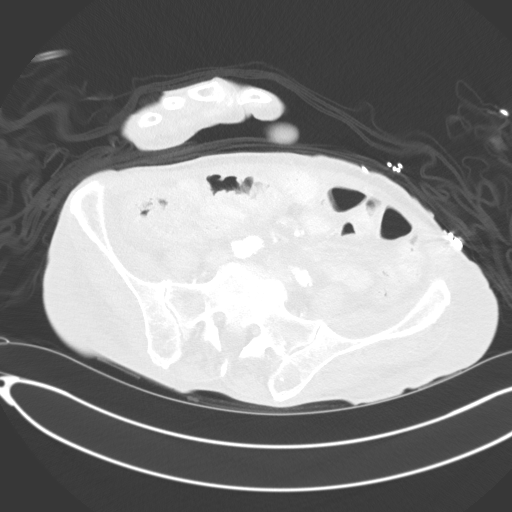
[im 54/90  soft-tissue]
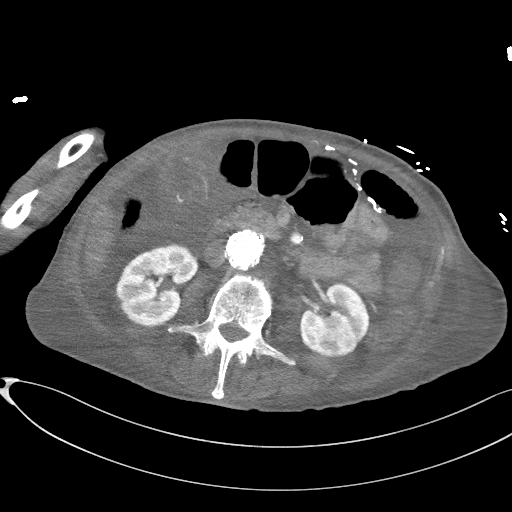
[im 54/90  lung]
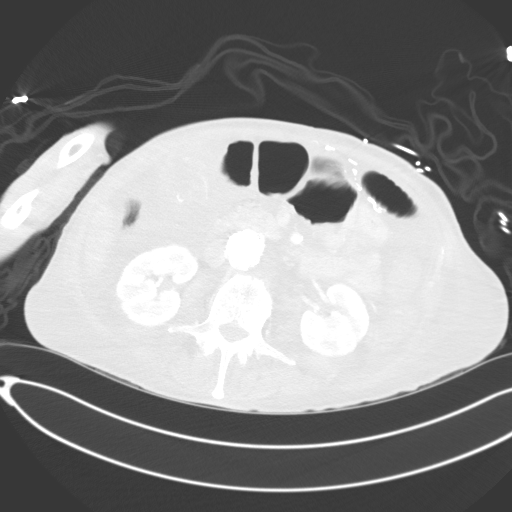
[im 72/90  soft-tissue]
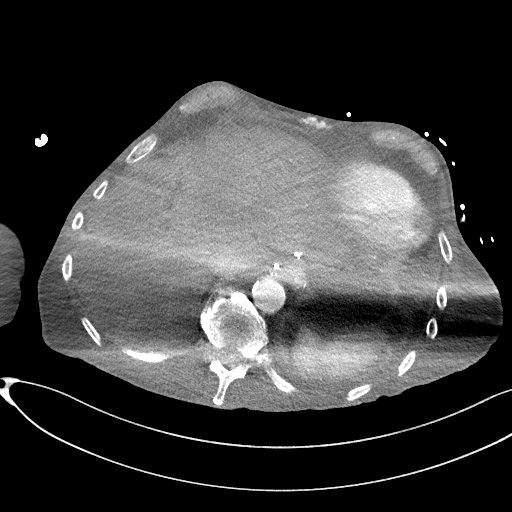
[im 72/90  lung]
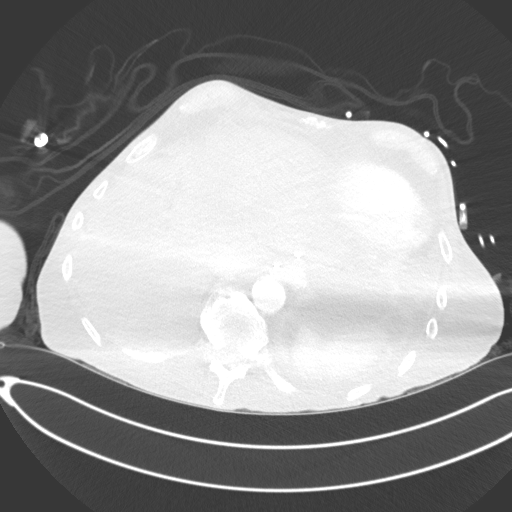

[9 of 46 positions shown; findings below may reference images not displayed]

FINDINGS: CTA CHEST FINDINGS

Cardiovascular: Satisfactory opacification of the pulmonary arteries
to the segmental level. No evidence of pulmonary embolism. Pulmonary
trunk is enlarged measuring 3.6 cm. Marked aortic atherosclerosis.
Ectatic ascending aorta up to 3.7 cm. Status post CABG. Coronary
artery calcification. Cardiomegaly with prominent right atrial
enlargement. No significant pericardial effusion. Reflux of contrast
into the hepatic veins consistent with elevated right heart
pressures. Numerous enhancing collateral vessels within the
mediastinum and around the bronchi.

Mediastinum/Nodes: Midline trachea. No thyroid mass. No
significantly enlarged lymph nodes. Esophagus within normal limits.

Lungs/Pleura: Moderate severe emphysema. Small moderate loculated
left pleural effusion. Small right-sided pleural effusion with
loculation along the pulmonary fissure. Mucous plugging within the
left lower lobe bronchus and partial consolidations/atelectasis at
the left lower lobe. Mild posterior consolidation within the right
upper lobe.

Musculoskeletal: Post sternotomy changes. No acute or suspicious
lesion.

Review of the MIP images confirms the above findings.

CT ABDOMEN and PELVIS FINDINGS

Hepatobiliary: No focal hepatic abnormality. Linear peripheral
low-density foci in the left hepatic lobe suspect for small amount
of portal venous gas. Enlarged gallbladder with multiple stones.
Gallbladder wall thickening and pericholecystic fluid is present.

Pancreas: Unremarkable. No pancreatic ductal dilatation or
surrounding inflammatory changes.

Spleen: Normal in size without focal abnormality.

Adrenals/Urinary Tract: Nodular thickening of the adrenal glands. No
hydronephrosis. Nonobstructing stones within the bilateral kidneys.
Bladder within normal limits.

Stomach/Bowel: Postsurgical changes of the stomach and small bowel.
Interval decompression of the small bowel since prior study
consistent with resolved obstruction. Mild wall thickening of pelvic
small bowel loops with mild mucosal enhancement. No pneumatosis.
Possible wall thickening of the right colon.

Vascular/Lymphatic: Marked aortic atherosclerosis. Status post aorto
iliac stent graft with flow present in the stent. Similar size of
infrarenal abdominal aortic aneurysm sac. No significantly enlarged
lymph nodes.

Reproductive: Scattered calcifications at the prostate.

Other: Negative for free air. Diffuse ascites within the abdomen and
pelvis. Small amount of hyperdense fluid within the dependent
pelvis, series 12, image number 70.

Musculoskeletal: Degenerative changes of the spine. No acute or
suspicious lesion.

Review of the MIP images confirms the above findings.
IMPRESSION: 1. Negative for acute pulmonary embolus
2. Enlarged pulmonary artery trunk with reflux of contrast into the
hepatic veins. Findings suggestive of pulmonary hypertension with
elevated right heart pressures. Cardiomegaly with significant right
atrial enlargement.
3. Moderate severe emphysema. Left greater than right bilateral
loculated pleural effusions, small moderate on the left and small on
the right.
4. Mucous plugging in the left lower lobe bronchi with suspected
atelectasis at the left lower lobe. Mild partial consolidation in
the posterior right upper lobe, possible small focus of pneumonia.
5. Gallbladder wall thickening with stones present. Gallbladder is
enlarged and there appears to be pericholecystic fluid, not certain
if this is due to diffuse ascites or due to cholecystitis. Consider
correlation with right upper quadrant abdominal ultrasound
6. Moderate to large diffuse ascites within the abdomen pelvis.
Small amount of hyperdense ascites in the posterior pelvis may
reflect minimal hemorrhage.
7. Extensive surgical changes of the stomach and small bowel without
definitive obstruction at this time.
8. Tiny suspected portal venous gas in the left hepatic lobe,
uncertain etiology. Suggest correlation with lactic acid
9. Status post aorto iliac stent graft without complicating features
at this time.
